# Patient Record
Sex: Female | Born: 1935
Health system: Southern US, Community
[De-identification: ages and names within clinical notes are randomized; demographics above are authoritative.]

## PROBLEM LIST (undated history)

## (undated) DIAGNOSIS — E119 Type 2 diabetes mellitus without complications: Secondary | ICD-10-CM

## (undated) DIAGNOSIS — F329 Major depressive disorder, single episode, unspecified: Secondary | ICD-10-CM

## (undated) DIAGNOSIS — F419 Anxiety disorder, unspecified: Secondary | ICD-10-CM

## (undated) DIAGNOSIS — M810 Age-related osteoporosis without current pathological fracture: Secondary | ICD-10-CM

## (undated) DIAGNOSIS — R413 Other amnesia: Secondary | ICD-10-CM

## (undated) DIAGNOSIS — Z87440 Personal history of urinary (tract) infections: Secondary | ICD-10-CM

## (undated) DIAGNOSIS — I10 Essential (primary) hypertension: Secondary | ICD-10-CM

## (undated) DIAGNOSIS — I639 Cerebral infarction, unspecified: Secondary | ICD-10-CM

## (undated) DIAGNOSIS — M1990 Unspecified osteoarthritis, unspecified site: Secondary | ICD-10-CM

## (undated) DIAGNOSIS — E559 Vitamin D deficiency, unspecified: Secondary | ICD-10-CM

## (undated) DIAGNOSIS — K219 Gastro-esophageal reflux disease without esophagitis: Secondary | ICD-10-CM

## (undated) DIAGNOSIS — Z87442 Personal history of urinary calculi: Secondary | ICD-10-CM

## (undated) DIAGNOSIS — F32A Depression, unspecified: Secondary | ICD-10-CM

## (undated) DIAGNOSIS — E785 Hyperlipidemia, unspecified: Secondary | ICD-10-CM

## (undated) DIAGNOSIS — R011 Cardiac murmur, unspecified: Secondary | ICD-10-CM

## (undated) DIAGNOSIS — Z87448 Personal history of other diseases of urinary system: Secondary | ICD-10-CM

## (undated) DIAGNOSIS — N200 Calculus of kidney: Secondary | ICD-10-CM

## (undated) HISTORY — PX: EXTRACORPOREAL SHOCK WAVE LITHOTRIPSY: SHX1557

## (undated) HISTORY — DX: Vitamin D deficiency, unspecified: E55.9

## (undated) HISTORY — PX: FRACTURE SURGERY: SHX138

## (undated) HISTORY — DX: Cerebral infarction, unspecified: I63.9

## (undated) HISTORY — PX: KNEE ARTHROSCOPY: SUR90

## (undated) HISTORY — PX: TUBAL LIGATION: SHX77

## (undated) HISTORY — PX: KNEE ARTHROPLASTY: SHX992

## (undated) HISTORY — PX: TOTAL KNEE ARTHROPLASTY: SHX125

## (undated) HISTORY — DX: Cardiac murmur, unspecified: R01.1

## (undated) HISTORY — PX: HIP ARTHROPLASTY: SHX981

## (undated) HISTORY — DX: Age-related osteoporosis without current pathological fracture: M81.0

---

## 2004-03-20 ENCOUNTER — Encounter: Admission: RE | Admit: 2004-03-20 | Discharge: 2004-06-18 | Payer: Self-pay | Admitting: Orthopedic Surgery

## 2004-08-10 ENCOUNTER — Inpatient Hospital Stay (HOSPITAL_COMMUNITY): Admission: RE | Admit: 2004-08-10 | Discharge: 2004-08-14 | Payer: Self-pay | Admitting: Orthopedic Surgery

## 2004-08-30 ENCOUNTER — Encounter: Admission: RE | Admit: 2004-08-30 | Discharge: 2004-11-28 | Payer: Self-pay | Admitting: Orthopedic Surgery

## 2004-10-24 ENCOUNTER — Ambulatory Visit (HOSPITAL_COMMUNITY): Admission: RE | Admit: 2004-10-24 | Discharge: 2004-10-24 | Payer: Self-pay | Admitting: Orthopedic Surgery

## 2006-09-17 ENCOUNTER — Ambulatory Visit: Payer: Self-pay | Admitting: Cardiovascular Disease

## 2006-10-06 ENCOUNTER — Encounter: Payer: Self-pay | Admitting: Cardiovascular Disease

## 2006-10-06 ENCOUNTER — Ambulatory Visit: Payer: Self-pay

## 2006-10-06 HISTORY — PX: TRANSTHORACIC ECHOCARDIOGRAM: SHX275

## 2007-03-09 ENCOUNTER — Inpatient Hospital Stay (HOSPITAL_COMMUNITY): Admission: RE | Admit: 2007-03-09 | Discharge: 2007-03-12 | Payer: Self-pay | Admitting: Orthopedic Surgery

## 2007-04-02 ENCOUNTER — Encounter: Admission: RE | Admit: 2007-04-02 | Discharge: 2007-04-03 | Payer: Self-pay | Admitting: Orthopedic Surgery

## 2010-11-13 NOTE — H&P (Signed)
NAME:  Caitlin Mccarthy, Caitlin Mccarthy             ACCOUNT NO.:  [id number]   MEDICAL RECORD NO.:  [id number]          PATIENT TYPE:  INP   LOCATION:  NA                           FACILITY:  [facility]   PHYSICIAN:  Ollen Gross, M.D.    DATE OF BIRTH:  [birth year]   DATE OF ADMISSION:  03/09/2007  DATE OF DISCHARGE:                              HISTORY & PHYSICAL   DATE OF OFFICE VISIT AND HISTORY AND PHYSICAL:  March 03, 2007.   CHIEF COMPLAINT:  Left knee pain.   HISTORY OF PRESENT ILLNESS:  The patient is a 75 year old female who has  been seen by Dr. Lequita Halt for ongoing left knee pain.  She previously has  undergone a right total knee back in February 2006.  That was doing  well.  The left knee unfortunately has progressed to the point where now  she is hurting most the time.  It is limiting what she can and cannot  do.  She has known end-stage arthritis and bone-on-bone throughout.  It  is felt she would benefit from undergoing surgical intervention.  Risks  and benefits discussed.  The patient is subsequently admitted to the  hospital.   ALLERGIES:  NO KNOWN DRUG ALLERGIES.   CURRENT MEDICATIONS:  1. Vytorin 10/20.  2. Amlodipine 10/20.  3. Vitamin D 5000 units.  4./  Arthrotec 75 mg.   PAST MEDICAL HISTORY:  1. Hypertension.  2. Hypercholesterolemia.  3. History of bronchitis.  4. Mild reflux.  5. History of urinary tract infections.  6. History of renal calculi.  7. Osteoporosis.  8. Postmenopausal.   PAST SURGICAL HISTORY:  1. Right knee arthroscopy, 2005.  2. Right total knee replacement, 2006.  3. Cystoscopy.  4. Lithotripsy.  5. Tubal ligation.   SOCIAL HISTORY:  Married.  Half pack smoker.  No alcohol.   FAMILY HISTORY:  Father deceased, age 39, with history of heart failure,  heart disease.  Mother deceased, age 34, with kidney failure, diabetes.  Brother with a history of renal cancer, deceased age 73.   REVIEW OF SYSTEMS:  GENERAL:  No fevers, chills, or night  sweats.  NEUROLOGIC:  No seizures, syncope, or paralysis.  RESPIRATORY:  No  shortness of breath, productive cough, or hemoptysis.  CARDIOVASCULAR:  No chest pain, angina, or orthopnea.  GI:  No nausea, vomiting,  diarrhea, or constipation.  GU: No dysuria, hematuria, or discharge.  MUSCULOSKELETAL:  Left knee.   PHYSICAL EXAMINATION:  VITAL SIGNS:  Pulse 68, respirations 12, blood  pressure 148/70.  GENERAL:  A 75 year old white female, well nourished, well developed,  large frame, slightly overweight.  No acute distress.  She is alert,  oriented, and cooperative, very pleasant.  HEENT: Normocephalic, atraumatic.  Pupils round, reactive.  Oropharynx  clear.  EOMs intact.  NECK:  Supple.  CHEST:  Clear anterior and posterior chest walls.  No rhonchi, rales, or  wheezing.  HEART:  Regular rate and rhythm.  Grade 2/6, best heard over aortic,  slight over pulmonic points.  S1, S2 noted.  ABDOMEN:  Soft, nontender.  Bowel sounds present.  RECTAL/BREASTS/GENITALIA:  Not done,  not pertinent to present illness.  EXTREMITIES:  Left knee:  Left knee shows range of motion of 10-120.  No  instability.  No effusion.  Marked crepitus.  Tender more medial than  lateral.   IMPRESSION:  1. Osteoarthritis left knee.  2. Hypertension.  3. Hypercholesterolemia.  4. History of bronchitis.  5. Mild reflux.  6. History of renal calculi.  7. Osteoporosis.  8. Postmenopausal.   PLAN:  The patient will be admitted to [facility] to undergo a  left total knee replacement arthroplasty.  Surgery will be performed by  Ollen Gross.      Alexzandrew L. Perkins, P.A.C.      Ollen Gross, M.D.  Electronically Signed    ALP/MEDQ  D:  03/08/2007  T:  03/09/2007  Job:  [id number]   cc:   Bennie Pierini, N.P.  [facility]   Ernestina Penna, M.D.  Fax: [phone]

## 2010-11-13 NOTE — Op Note (Signed)
NAME:  Caitlin, Mccarthy             ACCOUNT NO.:  [id number]   MEDICAL RECORD NO.:  [id number]          PATIENT TYPE:  INP   LOCATION:  0008                         FACILITY:  [facility]   PHYSICIAN:  Ollen Gross, M.D.    DATE OF BIRTH:  1936/03/04   DATE OF PROCEDURE:  03/09/2007  DATE OF DISCHARGE:                               OPERATIVE REPORT   PREOPERATIVE DIAGNOSIS:  Osteoarthritis, left knee.   POSTOPERATIVE DIAGNOSIS:  Osteoarthritis, left knee.   PROCEDURE:  Left total knee arthroplasty.   SURGEON:  Ollen Gross, M.D.   ASSISTANT:  Avel Peace, P.A.-C   ANESTHESIA:  Spinal.   BLOOD LOSS:  Minimal.   DRAIN:  None.   TOURNIQUET TIME:  Thirty-one minutes at 300 mmHg.   COMPLICATIONS:  None.   CONDITION:  Stable, to Recovery.   BRIEF CLINICAL NOTE:  Caitlin Mccarthy is a 75 year old female who has end-  stage arthritis of the left knee with progressively worsening pain and  dysfunction.  She has had a previous successful right total knee  arthroplasty and presents now for left total knee arthroplasty.   PROCEDURE IN DETAIL:  After the successful administration of spinal  anesthetic, a tourniquet was placed high on the left thigh and left  lower extremity prepped and draped in the usual sterile fashion.  Extremity was wrapped in an Esmarch and tourniquet inflated to 300 mmHg.  A midline incision was made with a 10 blade through subcutaneous tissue  to the level of the extensor mechanism.  A fresh blade was used make a  medial parapatellar arthrotomy.  Soft tissue over the proximal medial  tibia is subperiosteally elevated to the joint line with a knife and  into the semimembranosus bursa with a Cobb elevator.  Soft tissue  laterally is elevated with attention being paid to avoid patellar tendon  on the tibial tubercle.  With patella subluxed laterally and knee flexed  to 90 degrees, ACL and PCL removed.  Drill was used to create a starting  hole in the distal femur and  the canal was thoroughly irrigated.  A 5-  degree left valgus alignment guide is placed and referencing off the  posterior condyles, rotation is marked and a block pinned to remove 10  mm off the distal femur.  Distal femoral resection is made with an  oscillating saw.  Sizing block is placed; size 4 is the most appropriate  for the femur.  Rotation is marked at the epicondylar axis, a size 4  cutting block is placed in the anterior, posterior and chamfer cuts are  made.   Tibia is subluxed forward and the menisci are removed.  Extramedullary  tibial alignment guide is placed, referencing proximally at the medial  aspect of the tibial tubercle and distally along the 2nd metatarsal axis  and tibial crest.  A block is pinned to remove approximately 10 mm off  the non-deficient lateral side.  I had to do an additional 2 mm to get  to the base of the medial defect.  The tibial resection is made with an  oscillating saw.  Size 3 is  the most appropriate tibial component and  the proximal tibia prepared with a modular drill and keel punch for a  size 3.  Femoral preparation is completed with the intercondylar cut for  the size 4.   Size 3 mobile bearing tibial trial, size 4 posterior stabilized femoral  trial and a 12.5-mm posterior-stabilized rotating platform insert trial  are placed.  with a 12.5, there was a little bit of hyperextension, so I  went to 15, which allowed for full extension with excellent varus and  valgus balance throughout full range of motion.  The patella was everted  and thickness measured to be 24 mm.  Freehand resection was taken to 14  mm, a 38 template is placed, lug holes drilled, trial patella is placed  and it tracks normally.  Osteophytes are removed off the posterior femur  with the trial in place.  All trials are removed and the cut bone  surfaces are prepared with pulsatile lavage.  Cement was mixed and once  ready for implantation, a size 3 mobile bearing  tibial tray, size 4  posterior-stabilized femur and 38 patella are cemented in place and  patella is held with a clamp.  Trial 15 insert is placed and knee held  in full extension and all extruded cement removed.  Once cement has  fully hardened, then the permanent 15-mm posterior-stabilized rotating  platform insert is placed into the tibial tray.  Wound is copiously  irrigated with saline solution.  FloSeal is injected on the posterior  capsule, medial and lateral gutters and suprapatellar area.  A moist  sponge is placed and the tourniquet is released with a total time of 31  minutes.  Minor bleeding stopped with cautery.  Further irrigation is  performed and the arthrotomy closed with interrupted #1 PDS.  Flexion  against gravity is 135 degrees.  Subcu is then closed with interrupted 2-  0 Vicryl and a subcuticular running 4-0 Monocryl.  The incision is  cleaned and dried and Steri-Strips and a bulky sterile dressing applied.  She is then placed into a knee immobilizer, awakened and transported to  Recovery in stable condition.      Ollen Gross, M.D.  Electronically Signed     FA/MEDQ  D:  03/09/2007  T:  03/10/2007  Job:  [id number]

## 2010-11-16 NOTE — Assessment & Plan Note (Signed)
[facility]                            CARDIOLOGY OFFICE NOTE   KEITH, CANCIO                    MRN:          [id number]  DATE:09/17/2006                            DOB:          1935-10-05    REFERRING PHYSICIAN:  Ernestina Penna, M.D.   She is referred by Dr. Varney Baas office for an abnormal EKG and  dyspnea.   She is a delightful 75 year old patient without previous cardiac  history.  Coronary risk factors include hypertension and smoking.   The patient is nondiabetic.  She is also hyperlipidemic.   Family history is unremarkable for premature coronary disease.  The  patient does get some exertional dyspnea.  Her activity is limited by  chronic knee problems.  She has had the right knee operated on by Dr.  Lequita Halt, but continues to have arthritis.  She will probably need the  left knee operated on in the future.  She does not have a formal  diagnosis of COPD, but she continues to smoke, and I notice that she has  hyperinflated lungs on her chest x-ray.   She denies any significant chest pain.  Her EKG was reviewed from Dr.  Kathi Der office, and to my eye it is actually normal.  She has an  abnormal QRST wave angle in V3, but I suspect this is only lead  placement.   In talking to the patient, she has never had a heart catheterization or  echocardiogram.  She has not had a recent stress test.  There has been  no chest pain, palpitations, or syncope.   She denies any allergies.   She smokes about half a pack a day for the last 15-20 years.   She drinks 2-3 caffeinated beverages a day.  She has hyperlipidemia.  She tried to do some water aerobics in the past, but had to stop due to  her knee problems.   She tries to cook without salt.  She is retired.  She used to work on a  farm, and continues to care for three of her grandchildren.   Her husband is still alive, but they seem to do their own activities  separately.   PREVIOUS SURGERIES:  1. Kidney stones in 1981.  2. Arthroscopic right knee surgery in August 2005.  3. Total right knee replacement in February 2006.   REVIEW OF SYSTEMS:  Otherwise remarkable for anxiety and depression,  reflux, arthritis, and constipation.   Mother died at age 88 of kidney failure.  Father died at age 15 of  congestive heart failure.   CURRENT MEDICATIONS:  1. An aspirin a day.  2. Vytorin 10/20.  3. Valium p.r.n.  4. Amlodipine 10/20.  5. Arthrotec 75 a day.  6. Boniva 150 a day.  7. Hydrocodone p.r.n. for pain.   PHYSICAL EXAMINATION:  VITAL SIGNS:  Blood pressure 130/80, pulse 68 and  regular.  HEENT:  Normal.  NECK:  Carotids normal, without bruit.  LUNGS:  Clear.  HEART:  There is an S1, S2, with a soft systolic murmur.  ABDOMEN:  Benign.  LOWER EXTREMITIES:  Intact  pulses, no edema.  She has decreased range of  motion in the right leg and walks with a cane.   EKG was as described, and to my review it is essentially normal.   IMPRESSION:  A 75 year old smoker with hypertension, hyperlipidemia, and  need for continued knee surgery.  Given the concerns about her EKG and  her risk factors, I think it is reasonable for the patient to have an  adenosine Myoview study.  This will help risk stratify her surgical risk  in the future as well.  We will do a 2D echocardiogram to rule out  anteroseptal or anterior wall motion abnormalities, and also assess her  soft systolic murmur.  I suspect that both of these tests will be low  risk, and she will be fine for her upcoming left knee surgery.   She will continue her amlodipine for blood pressure control.  She will  continue her Vytorin for hypercholesterolemia.  Dr. Christell Constant will continue  to follow her lipid and liver profiles.   So long as her Myoview and echocardiogram are normal, she will follow up  at [facility].  If either one of them are  abnormal, she will see me right  away.     Noralyn Pick. Eden Emms, MD, [facility]  Electronically Signed    PCN/MedQ  DD: 09/17/2006  DT: 09/17/2006  Job #: [id number]

## 2010-11-16 NOTE — Discharge Summary (Signed)
[name], WONDER             ACCOUNT NO.:  [id number]   MEDICAL RECORD NO.:  [id number]          PATIENT TYPE:  INP   LOCATION:  1605                         FACILITY:  [facility]   PHYSICIAN:  Caitlin Mccarthy, M.D.    DATE OF BIRTH:  August 02, 1935   DATE OF ADMISSION:  03/09/2007  DATE OF DISCHARGE:  03/12/2007                               DISCHARGE SUMMARY   ADMITTING DIAGNOSES:  1. Osteoarthritis, left knee.  2. Hypertension.  3. Hypercholesterolemia.  4. History of bronchitis.  5. Mild reflux.  6. History of renal calculi.  7. Osteoporosis.  8. Post menopausal.   DISCHARGE DIAGNOSES:  1. Osteoarthritis, left knee.      a.     Status post left total knee replacement arthroplasty.  2. Mild postoperative hyponatremia, improved.  3. Hypertension.  4. Hypercholesterolemia.  5. History of bronchitis.  6. Mild reflux.  7. History of renal calculi.  8. Osteoporosis.  9. Post menopausal.   PROCEDURE:  March 09, 2007, left total knee.  Surgeon:  Dr. Lequita Halt.  Assistant:  Avel Peace, PA-C.  Spinal anesthesia.  Minimal blood loss.  Tourniquet time:  Thirty-one minutes.   CONSULTATIONS:  None.   BRIEF HISTORY:  [name] is a 75 year old female with end stage  arthritis of the left knee, progressive worsening pain dysfunction,  successful right total knee, now presents for a left total knee.   LABORATORY DATA:  Pre-op CBC showed a hemoglobin of 14.5, hematocrit of  41.6, white cell count 9.5.  Post-op hemoglobin 12.2, drifted down to  11.7, last known H&H 11.7 and 33.5.  PT PTT pre-op 12 and 31,  respectively.  INR 0.9.  Serial pro time values followed with last known  PT INR 18..2 and 1.5.  Chem panel all within normal limits.  Serial B-  METs were followed.  Sodium did drop from 140 to 133 back up to 135.  Pre-op UA:  Positive glucose, trace leukocyte esterase, many epithelial  cells, only 3-6 white cells.  Followup UA negative.  Blood group/type is  O positive.   EKG,  September 11, 2006, sinus rhythm, changes consistent with anterior  septal infarct age indeterminate is confirmed.   HOSPITAL COURSE:  The patient was admitted to [facility],  tolerated procedure well, later transferred to the recovery room on the  orthopedic floor, started on started on PCA and p.o. analgesics for pain  control following surgery and was doing pretty well on the morning of  day #1, had a fair amount of pain postoperatively but a little bit  better, some intermittent nausea, started back on her home meds.  She  started to get up out of bed on day #1.  Weaned over to p.o. meds.  By day #2 she was doing much better.  Weaned off her PCA.  Started  getting up with PT.  From a therapy standpoint, she was getting about 12  feet that morning and about 75 feet that afternoon.  We did a followup  UA because the pre-op one was borderline.  The followup UA was negative.  The dressing was changed  on day #2.  The incision looked good.  Hemoglobin was down a little bit but she was asymptomatic.  Sodium  dropped a little bit, felt to be too dilutional component.  Fluids were  discontinued.  Her sodium came back up.  Continued to progress well.  By day #3, she was up ambulating 75 feet, and weaned over to p.o. meds.  She was progressing well with therapy and was discharged home.   DISCHARGE PLAN:  1. Discharge plan:  The patient was discharged home on March 12, 2007.  2. Discharge diagnoses:  Please see above.  3. Discharge meds:  Coumadin, Percocet, Robaxin, Lovenox prior to      discharge.  4. Follow up 2 weeks.  5. Activity:  Weightbearing as tolerated.  Home health PT.  Home      health nursing.  6. Diet:  As tolerated.   DISPOSITION:  Home   CONDITION ON DISCHARGE:  Improving.      Caitlin Mccarthy, P.A.C.      Caitlin Mccarthy, M.D.  Electronically Signed    ALP/MEDQ  D:  04/16/2007  T:  04/16/2007  Job:  [id number]   cc:   Ernestina Penna, M.D.   Fax: [phone]   Bennie Pierini, NP  [facility]   Caitlin Mccarthy, M.D.  Fax: [phone]

## 2011-04-12 LAB — URINALYSIS, ROUTINE W REFLEX MICROSCOPIC
Bilirubin Urine: NEGATIVE
Bilirubin Urine: NEGATIVE
Hgb urine dipstick: NEGATIVE
Nitrite: NEGATIVE
Protein, ur: NEGATIVE
Specific Gravity, Urine: 1.01
Specific Gravity, Urine: 1.021
Urobilinogen, UA: 0.2
Urobilinogen, UA: 0.2
pH: 6.5

## 2011-04-12 LAB — COMPREHENSIVE METABOLIC PANEL
ALT: 24
AST: 20
Albumin: 4.3
Alkaline Phosphatase: 57
Chloride: 107
GFR calc Af Amer: >60
Potassium: 3.9
Sodium: 140
Total Bilirubin: 0.7
Total Protein: 7.5

## 2011-04-12 LAB — CBC
HCT: 41.6
Hemoglobin: 11.7 — ABNORMAL LOW
MCHC: 34.8
Platelets: 163
Platelets: 173
Platelets: 235
RBC: 3.73 — ABNORMAL LOW
RDW: 12.6
RDW: 13.1
RDW: 13.3
WBC: 9.1
WBC: 9.5

## 2011-04-12 LAB — BASIC METABOLIC PANEL
BUN: 4 — ABNORMAL LOW
BUN: 8
Calcium: 9.3
Calcium: 9.5
Calcium: 9.8
Creatinine, Ser: 0.41
Creatinine, Ser: 0.5
Creatinine, Ser: 0.52
GFR calc Af Amer: >60
GFR calc Af Amer: >60
GFR calc non Af Amer: >60
GFR calc non Af Amer: >60
Glucose, Bld: 127 — ABNORMAL HIGH
Glucose, Bld: 134 — ABNORMAL HIGH
Sodium: 134 — ABNORMAL LOW
Sodium: 135

## 2011-04-12 LAB — PROTIME-INR
INR: 1
INR: 1.3
INR: 1.5
INR: 0.9
Prothrombin Time: 13.7
Prothrombin Time: 16.5 — ABNORMAL HIGH
Prothrombin Time: 18.2 — ABNORMAL HIGH

## 2011-04-12 LAB — TYPE AND SCREEN
ABO/RH(D): O POS
Antibody Screen: NEGATIVE

## 2011-04-12 LAB — URINE MICROSCOPIC-ADD ON

## 2011-05-28 ENCOUNTER — Other Ambulatory Visit: Payer: Self-pay

## 2011-05-28 ENCOUNTER — Encounter (HOSPITAL_COMMUNITY)
Admission: RE | Admit: 2011-05-28 | Discharge: 2011-05-28 | Disposition: A | Discharge status: Home / Self Care | Payer: Medicare HMO | Source: Ambulatory Visit | Attending: Ophthalmology | Admitting: Ophthalmology

## 2011-05-28 ENCOUNTER — Encounter (HOSPITAL_COMMUNITY): Payer: Self-pay | Admitting: Pharmacy Technician

## 2011-05-28 ENCOUNTER — Encounter (HOSPITAL_COMMUNITY): Payer: Self-pay

## 2011-05-28 HISTORY — DX: Essential (primary) hypertension: I10

## 2011-05-28 HISTORY — DX: Anxiety disorder, unspecified: F41.9

## 2011-05-28 HISTORY — DX: Major depressive disorder, single episode, unspecified: F32.9

## 2011-05-28 HISTORY — DX: Depression, unspecified: F32.A

## 2011-05-28 LAB — CBC
HCT: 44.6 % (ref 36.0–46.0)
Hemoglobin: 14.7 g/dL (ref 12.0–15.0)
RDW: 13.2 % (ref 11.5–15.5)
WBC: 5.5 10*3/uL (ref 4.0–10.5)

## 2011-05-28 LAB — BASIC METABOLIC PANEL
BUN: 18 mg/dL (ref 6–23)
Chloride: 103 mEq/L (ref 96–112)
GFR calc Af Amer: >90 mL/min (ref >90)
Potassium: 4.4 mEq/L (ref 3.5–5.1)
Sodium: 139 mEq/L (ref 135–145)

## 2011-05-28 NOTE — Patient Instructions (Addendum)
22 Caitlin Mccarthy  05/28/2011   Your procedure is scheduled on:  06/06/2011  Report to [facility] at 08:00 AM.  Call this number if you have problems the morning of surgery: [phone]   Remember:   Do not eat food:After Midnight.  May have clear liquids:until Midnight .  Clear liquids include soda, tea, black coffee, apple or grape juice, broth.  Take these medicines the morning of surgery with A SIP OF WATER: Lotrel, Celebrex   Do not wear jewelry, make-up or nail polish.  Do not wear lotions, powders, or perfumes. You may wear deodorant.  Do not shave 48 hours prior to surgery.  Do not bring valuables to the hospital.  Contacts, dentures or bridgework may not be worn into surgery.  Leave suitcase in the car. After surgery it may be brought to your room.  For patients admitted to the hospital, checkout time is 11:00 AM the day of discharge.   Patients discharged the day of surgery will not be allowed to drive home.  Name and phone number of your driver:   Special Instructions: N/A   Please read over the following fact sheets that you were given: Anesthesia Post-op Instructions      Cataract A cataract is a clouding of the lens of the eye. It is most often related to aging. A cataract is not a "film" over the surface of the eye. The lens is inside the eye and changes size of the pupil. The lens can enlarge to let more light enter the eye in dark environments and contract the size of the pupil to let in bright light. The lens is the part of the eye that helps focus light on the retina. The retina is the eye's light-sensitive layer. It is in the back of the eye that sends visual signals to the brain. In a normal eye, light passes through the lens and gets focused on the retina. To help produce a sharp image, the lens must remain clear. When a lens becomes cloudy, vision is compromised by the degree and nature of the clouding. Certain cataracts make people more near-sighted as they  develop, others increase glare, and all reduce vision to some degree or another. A cataract that is so dense that it becomes milky white and a white opacity can be seen through the pupil. When the white color is seen, it is called a "mature" or "hyper-mature cataract." Such cataracts cause total blindness in the affected eye. The cataract must be removed to prevent damage to the eye itself. Some types of cataracts can cause a secondary disease of the eye, such as certain types of glaucoma. In the early stages, better lighting and eyeglasses may lessen vision problems caused by cataracts. At a certain point, surgery may be needed to improve vision. CAUSES   Aging. However, cataracts may occur at any age, even in newborns.   Certain drugs.   Trauma to the eye.   Certain diseases (such as diabetes).   Inherited or acquired medical syndromes.  SYMPTOMS   Gradual, progressive drop in vision in the affected eye. Cataracts may develop at different rates in each eye. Cataracts may even be in just one eye with the other unaffected.   Cataracts due to trauma may develop quickly, sometimes over a matter or days or even hours. The result is severe and rapid visual loss.  DIAGNOSIS  To detect a cataract, an eye doctor examines the lens. A well developed cataract can be diagnosed without dilating  the pupil. Early cataracts and others of a specific nature are best diagnosed with an exam of the eyes with the pupils dilated by drops. TREATMENT   For an early cataract, vision may improve by using different eyeglasses or stronger lighting.   If the above measures do not help, surgery is the only effective treatment. This treatment removes the cloudy lens and replaces it with a substitute lens (Intraocular lens, or IOL). Newly developed IOL technology allows the implanted lens to improve vision both at a distance and up close. Discuss with your eye surgeon about the possibility of still needing glasses. Also  discuss how visual coordination between both eyes will be affected.  A cataract needs to be removed only when vision loss interferes with your everyday activities such as driving, reading or watching TV. You and your eye doctor can make that decision together. In most cases, waiting until you are ready to have cataract surgery will not harm your eye. If you have cataracts in both eyes, only one should be removed at a time. This allows the operated eye to heal and be out of danger from serious problems (such as infection or poor wound healing) before having the other eye undergo surgery.  Sometimes, a cataract should be removed even if it does not cause problems with your vision. For example, a cataract should be removed if it prevents examination or treatment of another eye problem. Just as you cannot see out of the affected eye well, your doctor cannot see into your eye well through a cataract. The vast majority of people who have cataract surgery have better vision afterward. CATARACT REMOVAL There are two primary ways to remove a cataract. Your doctor can explain the differences and help determine which is best for you:  Phacoemulsification (small incision cataract surgery). This involves making a small cut (incision) on the edge of the clear, dome-shaped surface that covers the front of the eye (the cornea). An injection behind the eye or eye drops are given to make this a painless procedure. The doctor then inserts a tiny probe into the eye. This device emits ultrasound waves that soften and break up the cloudy center of the lens so it can be removed by suction. Most cataract surgery is done this way. The cuts are usually so small and performed in such a manner that often no sutures are needed to keep it closed.   Extracapsular surgery. Your doctor makes a slightly longer incision on the side of the cornea. The doctor removes the hard center of the lens. The remainder of the lens is then removed by  suction. In some cases, extremely fine sutures are needed which the doctor may, or may not remove in the office after the surgery.  When an IOL is implanted, it needs no care. It becomes a permanent part of your eye and cannot be seen or felt.  Some people cannot have an IOL. They may have problems during surgery, or maybe they have another eye disease. For these people, a soft contact lens may be suggested. If an IOL or contact lens cannot be used, very powerful and thick glasses are required after surgery. Since vision is very different through such thick glasses, it is important to have your doctor discuss the impact on your vision after any cataract surgery where there is no plan to implant an IOL. The normal lens of the eye is covered by a clear capsule. Both phacoemulsification and extracapsular surgery require that the back surface  of this lens capsule be left in place. This helps support IOLs and prevents the IOL from dislocating and falling back into the deeper interior of the eye. Right after surgery, and often permanently this "posterior capsule" remains clear. In some cases however, it can become cloudy, presenting the same type of visual compromise that the original cataract did since light is again obstructed as it passes through the clear IOL. This condition is often referred to as an "after-cataract." Fortunately, after-cataracts are easily treated using a painless and very fast laser treatment that is performed without anesthesia or incisions. It is done in a matter of minutes in an outpatient environment. Visual improvement is often immediate.  HOME CARE INSTRUCTIONS   Your surgeon will discuss pre and post operative care with you prior to surgery. The majority of people are able to do almost all normal activities right away. Although, it is often advised to avoid strenuous activity for a period of time.   Postoperative drops and careful avoidance of infection will be needed. Many surgeons  suggest the use of a protective shield during the first few days after surgery.   There is a very small incidence of complication from modern cataract surgery, but it can happen. Infection that spreads to the inside of the eye (endophthalmitis) can result in total visual loss and even loss of the eye itself. In extremely rare instances, the inflammation of endophthalmitis can spread to both eyes (sympathetic ophthalmia). Appropriate post-operative care under the close observation of your surgeon is essential to a successful outcome.  SEEK IMMEDIATE MEDICAL CARE IF:   You have any sudden drop of vision in the operated eye.   You have pain in the operated eye.   You see a large number of floating dots in the field of vision in the operated eye.   You see flashing lights, or if a portion of your side vision in any direction appears black (like a curtain being drawn into your field of vision) in the operated eye.  Document Released: 06/17/2005 Document Revised: 02/27/2011 Document Reviewed: 08/03/2007 [facility] Patient Information 2012 [place], [place].      PATIENT INSTRUCTIONS POST-ANESTHESIA  IMMEDIATELY FOLLOWING SURGERY:  Do not drive or operate machinery for the first twenty four hours after surgery.  Do not make any important decisions for twenty four hours after surgery or while taking narcotic pain medications or sedatives.  If you develop intractable nausea and vomiting or a severe headache please notify your doctor immediately.  FOLLOW-UP:  Please make an appointment with your surgeon as instructed. You do not need to follow up with anesthesia unless specifically instructed to do so.  WOUND CARE INSTRUCTIONS (if applicable):  Keep a dry clean dressing on the anesthesia/puncture wound site if there is drainage.  Once the wound has quit draining you may leave it open to air.  Generally you should leave the bandage intact for twenty four hours unless there is drainage.  If the epidural  site drains for more than 36-48 hours please call the anesthesia department.  QUESTIONS?:  Please feel free to call your physician or the hospital operator if you have any questions, and they will be happy to assist you.     [facility] Anesthesia Department [address] [place] [phone]

## 2011-06-06 ENCOUNTER — Encounter (HOSPITAL_COMMUNITY): Payer: Self-pay | Admitting: Anesthesiology

## 2011-06-06 ENCOUNTER — Encounter (HOSPITAL_COMMUNITY)
Admission: RE | Disposition: A | Discharge status: Home / Self Care | Payer: Self-pay | Source: Ambulatory Visit | Attending: Ophthalmology

## 2011-06-06 ENCOUNTER — Ambulatory Visit (HOSPITAL_COMMUNITY): Payer: Medicare HMO | Admitting: Anesthesiology

## 2011-06-06 ENCOUNTER — Ambulatory Visit (HOSPITAL_COMMUNITY)
Admission: RE | Admit: 2011-06-06 | Discharge: 2011-06-06 | Disposition: A | Discharge status: Home / Self Care | Payer: Medicare HMO | Source: Ambulatory Visit | Attending: Ophthalmology | Admitting: Ophthalmology

## 2011-06-06 ENCOUNTER — Encounter (HOSPITAL_COMMUNITY): Payer: Self-pay | Admitting: Ophthalmology

## 2011-06-06 DIAGNOSIS — I10 Essential (primary) hypertension: Secondary | ICD-10-CM | POA: Insufficient documentation

## 2011-06-06 DIAGNOSIS — Z79899 Other long term (current) drug therapy: Secondary | ICD-10-CM | POA: Insufficient documentation

## 2011-06-06 DIAGNOSIS — H2589 Other age-related cataract: Secondary | ICD-10-CM | POA: Insufficient documentation

## 2011-06-06 DIAGNOSIS — Z01810 Encounter for preprocedural cardiovascular examination: Secondary | ICD-10-CM | POA: Insufficient documentation

## 2011-06-06 DIAGNOSIS — Z01812 Encounter for preprocedural laboratory examination: Secondary | ICD-10-CM | POA: Insufficient documentation

## 2011-06-06 HISTORY — PX: CATARACT EXTRACTION W/PHACO: SHX586

## 2011-06-06 SURGERY — PHACOEMULSIFICATION, CATARACT, WITH IOL INSERTION
Anesthesia: Monitor Anesthesia Care | Site: Eye | Laterality: Right | Wound class: Clean

## 2011-06-06 MED ORDER — BSS IO SOLN
INTRAOCULAR | Status: DC | PRN
  Administered 2011-06-06: 15 mL via INTRAOCULAR

## 2011-06-06 MED ORDER — LIDOCAINE 3.5 % OP GEL OPTIME - NO CHARGE
OPHTHALMIC | Status: DC | PRN
  Administered 2011-06-06: 1 [drp] via OPHTHALMIC

## 2011-06-06 MED ORDER — MIDAZOLAM HCL 2 MG/2ML IJ SOLN
1.0000 mg | INTRAMUSCULAR | Status: DC | PRN
  Administered 2011-06-06: 2 mg via INTRAVENOUS

## 2011-06-06 MED ORDER — TETRACAINE HCL 0.5 % OP SOLN
1.0000 [drp] | OPHTHALMIC | Status: AC
  Administered 2011-06-06 (×3): 1 [drp] via OPHTHALMIC

## 2011-06-06 MED ORDER — LIDOCAINE HCL (PF) 1 % IJ SOLN
INTRAMUSCULAR | Status: AC
  Filled 2011-06-06: qty 2

## 2011-06-06 MED ORDER — CYCLOPENTOLATE-PHENYLEPHRINE 0.2-1 % OP SOLN
1.0000 [drp] | OPHTHALMIC | Status: AC
  Administered 2011-06-06 (×3): 1 [drp] via OPHTHALMIC

## 2011-06-06 MED ORDER — MIDAZOLAM HCL 2 MG/2ML IJ SOLN
INTRAMUSCULAR | Status: AC
  Filled 2011-06-06: qty 2

## 2011-06-06 MED ORDER — LACTATED RINGERS IV SOLN
INTRAVENOUS | Status: DC
  Administered 2011-06-06: 1000 mL via INTRAVENOUS

## 2011-06-06 MED ORDER — PHENYLEPHRINE HCL 2.5 % OP SOLN
OPHTHALMIC | Status: AC
  Administered 2011-06-06: 1 [drp] via OPHTHALMIC
  Filled 2011-06-06: qty 2

## 2011-06-06 MED ORDER — NEOMYCIN-POLYMYXIN-DEXAMETH 0.1 % OP OINT
TOPICAL_OINTMENT | OPHTHALMIC | Status: DC | PRN
  Administered 2011-06-06: 1 via OPHTHALMIC

## 2011-06-06 MED ORDER — PHENYLEPHRINE HCL 2.5 % OP SOLN
1.0000 [drp] | OPHTHALMIC | Status: AC
  Administered 2011-06-06 (×3): 1 [drp] via OPHTHALMIC

## 2011-06-06 MED ORDER — CYCLOPENTOLATE-PHENYLEPHRINE 0.2-1 % OP SOLN
OPHTHALMIC | Status: AC
  Administered 2011-06-06: 1 [drp] via OPHTHALMIC
  Filled 2011-06-06: qty 2

## 2011-06-06 MED ORDER — LIDOCAINE HCL 3.5 % OP GEL
1.0000 "application " | Freq: Once | OPHTHALMIC | Status: AC
  Administered 2011-06-06: 1 via OPHTHALMIC

## 2011-06-06 MED ORDER — LIDOCAINE HCL (PF) 1 % IJ SOLN
INTRAMUSCULAR | Status: DC | PRN
  Administered 2011-06-06: .4 mL

## 2011-06-06 MED ORDER — LIDOCAINE HCL 3.5 % OP GEL
OPHTHALMIC | Status: AC
  Administered 2011-06-06: 1 via OPHTHALMIC
  Filled 2011-06-06: qty 5

## 2011-06-06 MED ORDER — TETRACAINE HCL 0.5 % OP SOLN
OPHTHALMIC | Status: AC
  Administered 2011-06-06: 1 [drp] via OPHTHALMIC
  Filled 2011-06-06: qty 2

## 2011-06-06 MED ORDER — PROVISC 10 MG/ML IO SOLN
INTRAOCULAR | Status: DC | PRN
  Administered 2011-06-06: 8.5 mg via INTRAOCULAR

## 2011-06-06 MED ORDER — POVIDONE-IODINE 5 % OP SOLN
OPHTHALMIC | Status: DC | PRN
  Administered 2011-06-06: 1 via OPHTHALMIC

## 2011-06-06 MED ORDER — EPINEPHRINE HCL 1 MG/ML IJ SOLN
INTRAOCULAR | Status: DC | PRN
  Administered 2011-06-06: 09:00:00

## 2011-06-06 MED ORDER — NEOMYCIN-POLYMYXIN-DEXAMETH 3.5-10000-0.1 OP OINT
TOPICAL_OINTMENT | OPHTHALMIC | Status: AC
  Filled 2011-06-06: qty 3.5

## 2011-06-06 MED ORDER — EPINEPHRINE HCL 1 MG/ML IJ SOLN
INTRAMUSCULAR | Status: AC
  Filled 2011-06-06: qty 1

## 2011-06-06 SURGICAL SUPPLY — 32 items
CAPSULAR TENSION RING-AMO (OPHTHALMIC RELATED) IMPLANT
CLOTH BEACON ORANGE TIMEOUT ST (SAFETY) ×1 IMPLANT
EYE SHIELD UNIVERSAL CLEAR (GAUZE/BANDAGES/DRESSINGS) ×2 IMPLANT
GLOVE BIO SURGEON STRL SZ 6.5 (GLOVE) IMPLANT
GLOVE BIOGEL PI IND STRL 6.5 (GLOVE) ×1 IMPLANT
GLOVE BIOGEL PI IND STRL 7.0 (GLOVE) IMPLANT
GLOVE BIOGEL PI IND STRL 7.5 (GLOVE) IMPLANT
GLOVE BIOGEL PI INDICATOR 6.5 (GLOVE) ×1
GLOVE BIOGEL PI INDICATOR 7.0 (GLOVE)
GLOVE BIOGEL PI INDICATOR 7.5 (GLOVE)
GLOVE ECLIPSE 6.5 STRL STRAW (GLOVE) IMPLANT
GLOVE ECLIPSE 7.0 STRL STRAW (GLOVE) IMPLANT
GLOVE ECLIPSE 7.5 STRL STRAW (GLOVE) IMPLANT
GLOVE EXAM NITRILE LRG STRL (GLOVE) IMPLANT
GLOVE EXAM NITRILE MD LF STRL (GLOVE) ×1 IMPLANT
GLOVE SKINSENSE NS SZ6.5 (GLOVE)
GLOVE SKINSENSE NS SZ7.0 (GLOVE)
GLOVE SKINSENSE STRL SZ6.5 (GLOVE) IMPLANT
GLOVE SKINSENSE STRL SZ7.0 (GLOVE) IMPLANT
KIT VITRECTOMY (OPHTHALMIC RELATED) IMPLANT
PAD ARMBOARD 7.5X6 YLW CONV (MISCELLANEOUS) ×1 IMPLANT
PROC W NO LENS (INTRAOCULAR LENS)
PROC W SPEC LENS (INTRAOCULAR LENS)
PROCESS W NO LENS (INTRAOCULAR LENS) IMPLANT
PROCESS W SPEC LENS (INTRAOCULAR LENS) IMPLANT
RING MALYGIN (MISCELLANEOUS) IMPLANT
SIGHTPATH CAT PROC W REG LENS (Ophthalmic Related) ×2 IMPLANT
SYR TB 1ML LL NO SAFETY (SYRINGE) ×2 IMPLANT
TAPE SURG TRANSPORE 1 IN (GAUZE/BANDAGES/DRESSINGS) IMPLANT
TAPE SURGICAL TRANSPORE 1 IN (GAUZE/BANDAGES/DRESSINGS) ×1
VISCOELASTIC ADDITIONAL (OPHTHALMIC RELATED) IMPLANT
WATER STERILE IRR 250ML POUR (IV SOLUTION) ×2 IMPLANT

## 2011-06-06 NOTE — Anesthesia Postprocedure Evaluation (Signed)
  Anesthesia Post-op Note  Patient: Caitlin Mccarthy  Procedure(s) Performed:  CATARACT EXTRACTION PHACO AND INTRAOCULAR LENS PLACEMENT (IOC) - CDE=12.77  Patient Location: PACU  Anesthesia Type: MAC  Level of Consciousness: awake, alert  and oriented  Airway and Oxygen Therapy: Patient Spontanous Breathing  Post-op Pain: none  Post-op Assessment: Post-op Vital signs reviewed, Patient's Cardiovascular Status Stable, Respiratory Function Stable and Patent Airway  Post-op Vital Signs: Reviewed and stable  Complications: No apparent anesthesia complications

## 2011-06-06 NOTE — Brief Op Note (Signed)
Pre-Op Dx: Cataract OD Post-Op Dx: Cataract OD Surgeon: Kamya Watling Anesthesia: Topical with MAC Implant: Lenstec, Model Softec HD Blood Loss: None Specimen: None Complications: None 

## 2011-06-06 NOTE — Transfer of Care (Signed)
Immediate Anesthesia Transfer of Care Note  Patient: Caitlin Mccarthy  Procedure(s) Performed:  CATARACT EXTRACTION PHACO AND INTRAOCULAR LENS PLACEMENT (IOC) - CDE=12.77  Patient Location: Short Stay  Anesthesia Type: MAC  Level of Consciousness: awake, alert  and oriented  Airway & Oxygen Therapy: Patient Spontanous Breathing  Post-op Assessment: Report given to PACU RN  Post vital signs: Reviewed and stable  Complications: No apparent anesthesia complications

## 2011-06-06 NOTE — H&P (Signed)
I have reviewed the H&P, the patient was re-examined, and I have identified no interval changes in medical condition and plan of care since the history and physical of record  

## 2011-06-06 NOTE — Op Note (Signed)
NAME:  Caitlin Mccarthy, Caitlin Mccarthy             ACCOUNT NO.:  [id number]  MEDICAL RECORD NO.:  [id number]  LOCATION:  APPO                          FACILITY:  [facility]  PHYSICIAN:  Susanne Greenhouse, MD       DATE OF BIRTH:  July 24, 1935  DATE OF PROCEDURE:  06/06/2011 DATE OF DISCHARGE:  06/06/2011                              OPERATIVE REPORT   PREOPERATIVE DIAGNOSIS:  Combined cataract, right eye, diagnosis code 366.19.  POSTOPERATIVE DIAGNOSIS:  Combined cataract, right eye, diagnosis code 366.19.  OPERATION PERFORMED:  Phacoemulsification with posterior chamber intraocular lens implantation, right eye.  SURGEON:  Bonne Dolores. Cristella Stiver, MD  ANESTHESIA:  General endotracheal anesthesia.  OPERATIVE SUMMARY:  In the preoperative area, dilating drops were placed into the right eye.  The patient was then brought into the operating room where she was placed under general anesthesia.  The eye was then prepped and draped.  Beginning with a 75 blade, a paracentesis port was made at the surgeon's 2 o'clock position.  The anterior chamber was then filled with a 1% nonpreserved lidocaine solution with epinephrine.  This was followed by Viscoat to deepen the chamber.  A small fornix-based peritomy was performed superiorly.  Next, a single iris hook was placed through the limbus superiorly.  A 2.4-mm keratome blade was then used to make a clear corneal incision over the iris hook.  A bent cystotome needle and Utrata forceps were used to create a continuous tear capsulotomy.  Hydrodissection was performed using balanced salt solution on a fine cannula.  The lens nucleus was then removed using phacoemulsification in a quadrant cracking technique.  The cortical material was then removed with irrigation and aspiration.  The capsular bag and anterior chamber were refilled with Provisc.  The wound was widened to approximately 3 mm and a posterior chamber intraocular lens was placed into the capsular bag without difficulty  using an [employer] lens injecting system.  A single 10-0 nylon suture was then used to close the incision as well as stromal hydration.  The Provisc was removed from the anterior chamber and capsular bag with irrigation and aspiration.  At this point, the wounds were tested for leak, which were negative.  The anterior chamber remained deep and stable.  The patient tolerated the procedure well.  There were no operative complications, and she awoke from general anesthesia without problem.  No surgical specimens.  Prosthetic device used is a Lenstec posterior chamber lens, model Softec HD, power of 21.0, serial number is [id number].          ______________________________ Susanne Greenhouse, MD     KEH/MEDQ  D:  06/06/2011  T:  06/06/2011  Job:  [id number]

## 2011-06-06 NOTE — Anesthesia Preprocedure Evaluation (Signed)
Anesthesia Evaluation  Patient identified by MRN, date of birth, ID band Patient awake    Reviewed: Allergy & Precautions, H&P , NPO status , Patient's Chart, lab work & pertinent test results  Airway Mallampati: II      Dental  (+) Teeth Intact   Pulmonary shortness of breath and with exertion, Current Smoker,    Pulmonary exam normal       Cardiovascular hypertension, Pt. on medications Regular Normal    Neuro/Psych    GI/Hepatic   Endo/Other    Renal/GU      Musculoskeletal  (+) Arthritis -,   Abdominal   Peds  Hematology   Anesthesia Other Findings   Reproductive/Obstetrics                           Anesthesia Physical Anesthesia Plan  ASA: II  Anesthesia Plan: MAC   Post-op Pain Management:    Induction:   Airway Management Planned: Nasal Cannula  Additional Equipment:   Intra-op Plan:   Post-operative Plan:   Informed Consent: I have reviewed the patients History and Physical, chart, labs and discussed the procedure including the risks, benefits and alternatives for the proposed anesthesia with the patient or authorized representative who has indicated his/her understanding and acceptance.     Plan Discussed with:   Anesthesia Plan Comments:         Anesthesia Quick Evaluation

## 2011-06-13 ENCOUNTER — Encounter (HOSPITAL_COMMUNITY): Payer: Self-pay | Admitting: Ophthalmology

## 2011-06-20 ENCOUNTER — Encounter (HOSPITAL_COMMUNITY): Payer: Self-pay

## 2011-06-20 ENCOUNTER — Encounter (HOSPITAL_COMMUNITY)
Admit: 2011-06-20 | Discharge: 2011-06-20 | Disposition: A | Discharge status: Home / Self Care | Payer: Medicare HMO | Attending: Ophthalmology | Admitting: Ophthalmology

## 2011-06-20 MED ORDER — ONDANSETRON HCL 4 MG/2ML IJ SOLN: 4.0000 mg | Freq: Once | INTRAMUSCULAR | Status: DC | PRN

## 2011-06-20 MED ORDER — FENTANYL CITRATE 0.05 MG/ML IJ SOLN: 25.0000 ug | INTRAMUSCULAR | Status: DC | PRN

## 2011-06-27 ENCOUNTER — Encounter (HOSPITAL_COMMUNITY)
Admission: RE | Disposition: A | Discharge status: Home / Self Care | Payer: Self-pay | Source: Ambulatory Visit | Attending: Ophthalmology

## 2011-06-27 ENCOUNTER — Ambulatory Visit (HOSPITAL_COMMUNITY)
Admission: RE | Admit: 2011-06-27 | Discharge: 2011-06-27 | Disposition: A | Discharge status: Home / Self Care | Payer: Medicare HMO | Source: Ambulatory Visit | Attending: Ophthalmology | Admitting: Ophthalmology

## 2011-06-27 ENCOUNTER — Encounter (HOSPITAL_COMMUNITY): Payer: Self-pay | Admitting: Anesthesiology

## 2011-06-27 ENCOUNTER — Encounter (HOSPITAL_COMMUNITY): Payer: Self-pay | Admitting: *Deleted

## 2011-06-27 ENCOUNTER — Ambulatory Visit (HOSPITAL_COMMUNITY): Payer: Medicare HMO | Admitting: Anesthesiology

## 2011-06-27 DIAGNOSIS — H2589 Other age-related cataract: Secondary | ICD-10-CM | POA: Insufficient documentation

## 2011-06-27 DIAGNOSIS — Z79899 Other long term (current) drug therapy: Secondary | ICD-10-CM | POA: Insufficient documentation

## 2011-06-27 DIAGNOSIS — I10 Essential (primary) hypertension: Secondary | ICD-10-CM | POA: Insufficient documentation

## 2011-06-27 HISTORY — PX: CATARACT EXTRACTION W/PHACO: SHX586

## 2011-06-27 SURGERY — PHACOEMULSIFICATION, CATARACT, WITH IOL INSERTION
Anesthesia: Monitor Anesthesia Care | Site: Eye | Laterality: Left | Wound class: Clean

## 2011-06-27 MED ORDER — NEOMYCIN-POLYMYXIN-DEXAMETH 0.1 % OP OINT
TOPICAL_OINTMENT | OPHTHALMIC | Status: DC | PRN
  Administered 2011-06-27: 1 via OPHTHALMIC

## 2011-06-27 MED ORDER — MIDAZOLAM HCL 2 MG/2ML IJ SOLN
INTRAMUSCULAR | Status: AC
  Administered 2011-06-27: 2 mg via INTRAVENOUS
  Filled 2011-06-27: qty 2

## 2011-06-27 MED ORDER — TETRACAINE HCL 0.5 % OP SOLN
OPHTHALMIC | Status: AC
  Administered 2011-06-27: 1 [drp] via OPHTHALMIC
  Filled 2011-06-27: qty 2

## 2011-06-27 MED ORDER — EPINEPHRINE HCL 1 MG/ML IJ SOLN
INTRAOCULAR | Status: DC | PRN
  Administered 2011-06-27: 13:00:00

## 2011-06-27 MED ORDER — ONDANSETRON HCL 4 MG/2ML IJ SOLN: 4.0000 mg | Freq: Once | INTRAMUSCULAR | Status: DC | PRN

## 2011-06-27 MED ORDER — LIDOCAINE HCL 3.5 % OP GEL
OPHTHALMIC | Status: AC
  Administered 2011-06-27: 1 via OPHTHALMIC
  Filled 2011-06-27: qty 5

## 2011-06-27 MED ORDER — CYCLOPENTOLATE-PHENYLEPHRINE 0.2-1 % OP SOLN
OPHTHALMIC | Status: AC
  Administered 2011-06-27: 1 [drp] via OPHTHALMIC
  Filled 2011-06-27: qty 2

## 2011-06-27 MED ORDER — EPINEPHRINE HCL 1 MG/ML IJ SOLN
INTRAMUSCULAR | Status: AC
  Filled 2011-06-27: qty 1

## 2011-06-27 MED ORDER — FENTANYL CITRATE 0.05 MG/ML IJ SOLN: 25.0000 ug | INTRAMUSCULAR | Status: DC | PRN

## 2011-06-27 MED ORDER — LACTATED RINGERS IV SOLN
INTRAVENOUS | Status: DC
  Administered 2011-06-27: 1000 mL via INTRAVENOUS

## 2011-06-27 MED ORDER — MIDAZOLAM HCL 2 MG/2ML IJ SOLN
1.0000 mg | INTRAMUSCULAR | Status: DC | PRN
  Administered 2011-06-27 (×2): 2 mg via INTRAVENOUS

## 2011-06-27 MED ORDER — PHENYLEPHRINE HCL 2.5 % OP SOLN
1.0000 [drp] | OPHTHALMIC | Status: AC
  Administered 2011-06-27 (×3): 1 [drp] via OPHTHALMIC

## 2011-06-27 MED ORDER — LIDOCAINE HCL (PF) 1 % IJ SOLN
INTRAMUSCULAR | Status: DC | PRN
  Administered 2011-06-27: .5 mL

## 2011-06-27 MED ORDER — PROVISC 10 MG/ML IO SOLN
INTRAOCULAR | Status: DC | PRN
  Administered 2011-06-27: 8.5 mg via INTRAOCULAR

## 2011-06-27 MED ORDER — LIDOCAINE HCL 3.5 % OP GEL
1.0000 "application " | Freq: Once | OPHTHALMIC | Status: AC
  Administered 2011-06-27: 1 via OPHTHALMIC

## 2011-06-27 MED ORDER — POVIDONE-IODINE 5 % OP SOLN
OPHTHALMIC | Status: DC | PRN
  Administered 2011-06-27: 1 via OPHTHALMIC

## 2011-06-27 MED ORDER — LIDOCAINE 3.5 % OP GEL OPTIME - NO CHARGE
OPHTHALMIC | Status: DC | PRN
  Administered 2011-06-27: 1 [drp] via OPHTHALMIC

## 2011-06-27 MED ORDER — BSS IO SOLN
INTRAOCULAR | Status: DC | PRN
  Administered 2011-06-27: 15 mL via INTRAOCULAR

## 2011-06-27 MED ORDER — TETRACAINE HCL 0.5 % OP SOLN
1.0000 [drp] | OPHTHALMIC | Status: AC
  Administered 2011-06-27 (×3): 1 [drp] via OPHTHALMIC

## 2011-06-27 MED ORDER — LIDOCAINE HCL (PF) 1 % IJ SOLN
INTRAMUSCULAR | Status: AC
  Filled 2011-06-27: qty 2

## 2011-06-27 MED ORDER — NEOMYCIN-POLYMYXIN-DEXAMETH 3.5-10000-0.1 OP OINT
TOPICAL_OINTMENT | OPHTHALMIC | Status: AC
  Filled 2011-06-27: qty 3.5

## 2011-06-27 MED ORDER — CYCLOPENTOLATE-PHENYLEPHRINE 0.2-1 % OP SOLN
1.0000 [drp] | OPHTHALMIC | Status: AC
  Administered 2011-06-27 (×3): 1 [drp] via OPHTHALMIC

## 2011-06-27 MED ORDER — PHENYLEPHRINE HCL 2.5 % OP SOLN
OPHTHALMIC | Status: AC
  Administered 2011-06-27: 1 [drp] via OPHTHALMIC
  Filled 2011-06-27: qty 2

## 2011-06-27 SURGICAL SUPPLY — 32 items
CAPSULAR TENSION RING-AMO (OPHTHALMIC RELATED) IMPLANT
CLOTH BEACON ORANGE TIMEOUT ST (SAFETY) ×1 IMPLANT
EYE SHIELD UNIVERSAL CLEAR (GAUZE/BANDAGES/DRESSINGS) ×2 IMPLANT
GLOVE BIO SURGEON STRL SZ 6.5 (GLOVE) IMPLANT
GLOVE BIOGEL PI IND STRL 6.5 (GLOVE) IMPLANT
GLOVE BIOGEL PI IND STRL 7.0 (GLOVE) ×1 IMPLANT
GLOVE BIOGEL PI IND STRL 7.5 (GLOVE) IMPLANT
GLOVE BIOGEL PI INDICATOR 6.5 (GLOVE)
GLOVE BIOGEL PI INDICATOR 7.0 (GLOVE) ×1
GLOVE BIOGEL PI INDICATOR 7.5 (GLOVE)
GLOVE ECLIPSE 6.5 STRL STRAW (GLOVE) IMPLANT
GLOVE ECLIPSE 7.0 STRL STRAW (GLOVE) IMPLANT
GLOVE ECLIPSE 7.5 STRL STRAW (GLOVE) IMPLANT
GLOVE EXAM NITRILE LRG STRL (GLOVE) IMPLANT
GLOVE EXAM NITRILE MD LF STRL (GLOVE) ×1 IMPLANT
GLOVE SKINSENSE NS SZ6.5 (GLOVE)
GLOVE SKINSENSE NS SZ7.0 (GLOVE)
GLOVE SKINSENSE STRL SZ6.5 (GLOVE) IMPLANT
GLOVE SKINSENSE STRL SZ7.0 (GLOVE) IMPLANT
KIT VITRECTOMY (OPHTHALMIC RELATED) IMPLANT
PAD ARMBOARD 7.5X6 YLW CONV (MISCELLANEOUS) ×1 IMPLANT
PROC W NO LENS (INTRAOCULAR LENS)
PROC W SPEC LENS (INTRAOCULAR LENS)
PROCESS W NO LENS (INTRAOCULAR LENS) IMPLANT
PROCESS W SPEC LENS (INTRAOCULAR LENS) IMPLANT
RING MALYGIN (MISCELLANEOUS) IMPLANT
SIGHTPATH CAT PROC W REG LENS (Ophthalmic Related) ×2 IMPLANT
SYR TB 1ML LL NO SAFETY (SYRINGE) ×2 IMPLANT
TAPE SURG TRANSPORE 1 IN (GAUZE/BANDAGES/DRESSINGS) IMPLANT
TAPE SURGICAL TRANSPORE 1 IN (GAUZE/BANDAGES/DRESSINGS) ×1
VISCOELASTIC ADDITIONAL (OPHTHALMIC RELATED) IMPLANT
WATER STERILE IRR 250ML POUR (IV SOLUTION) ×2 IMPLANT

## 2011-06-27 NOTE — Anesthesia Procedure Notes (Addendum)
Procedure Name: MAC Date/Time: 06/27/2011 1:12 PM Performed by: Minerva Areola Pre-anesthesia Checklist: Patient identified, Patient being monitored, Emergency Drugs available, Timeout performed and Suction available Patient Re-evaluated:Patient Re-evaluated prior to inductionOxygen Delivery Method: Nasal Cannula

## 2011-06-27 NOTE — H&P (Signed)
I have reviewed the H&P, the patient was re-examined, and I have identified no interval changes in medical condition and plan of care since the history and physical of record  

## 2011-06-27 NOTE — Transfer of Care (Signed)
Immediate Anesthesia Transfer of Care Note  Patient: Caitlin Mccarthy  Procedure(s) Performed:  CATARACT EXTRACTION PHACO AND INTRAOCULAR LENS PLACEMENT (IOC) - CDE:13.96  Patient Location: Shortstay  Anesthesia Type: MAC  Level of Consciousness: awake  Airway & Oxygen Therapy: Patient Spontanous Breathing   Post-op Assessment: Report given to PACU RN, Post -op Vital signs reviewed and stable and Patient moving all extremities  Post vital signs: Reviewed and stable  Complications: No apparent anesthesia complications

## 2011-06-27 NOTE — Brief Op Note (Signed)
Pre-Op Dx: Cataract OS Post-Op Dx: Cataract OS Surgeon: Miara Emminger Anesthesia: Topical with MAC Implant: Lenstec, Model Softec HD Specimen: None Complications: None 

## 2011-06-27 NOTE — Anesthesia Postprocedure Evaluation (Signed)
  Anesthesia Post-op Note  Patient: Caitlin Mccarthy  Procedure(s) Performed:  CATARACT EXTRACTION PHACO AND INTRAOCULAR LENS PLACEMENT (IOC) - CDE:13.96  Patient Location:  Short Stay  Anesthesia Type: MAC  Level of Consciousness: awake  Airway and Oxygen Therapy: Patient Spontanous Breathing  Post-op Pain: none  Post-op Assessment: Post-op Vital signs reviewed, Patient's Cardiovascular Status Stable, Respiratory Function Stable, Patent Airway, No signs of Nausea or vomiting and Pain level controlled  Post-op Vital Signs: Reviewed and stable  Complications: No apparent anesthesia complications

## 2011-06-27 NOTE — Anesthesia Preprocedure Evaluation (Signed)
Anesthesia Evaluation  Patient identified by MRN, date of birth, ID band Patient awake    Reviewed: Allergy & Precautions, H&P , NPO status , Patient's Chart, lab work & pertinent test results  Airway Mallampati: II      Dental  (+) Teeth Intact   Pulmonary shortness of breath and with exertion, Current Smoker,    Pulmonary exam normal       Cardiovascular hypertension, Pt. on medications Regular Normal    Neuro/Psych    GI/Hepatic   Endo/Other    Renal/GU      Musculoskeletal  (+) Arthritis -,   Abdominal   Peds  Hematology   Anesthesia Other Findings   Reproductive/Obstetrics                           Anesthesia Physical Anesthesia Plan  ASA: II  Anesthesia Plan: MAC   Post-op Pain Management:    Induction: Intravenous  Airway Management Planned: Nasal Cannula  Additional Equipment:   Intra-op Plan:   Post-operative Plan:   Informed Consent: I have reviewed the patients History and Physical, chart, labs and discussed the procedure including the risks, benefits and alternatives for the proposed anesthesia with the patient or authorized representative who has indicated his/her understanding and acceptance.     Plan Discussed with:   Anesthesia Plan Comments:         Anesthesia Quick Evaluation

## 2011-06-28 NOTE — Op Note (Signed)
NAME:  Caitlin Mccarthy, Caitlin Mccarthy             ACCOUNT NO.:  [id number]  MEDICAL RECORD NO.:  [id number]  LOCATION:  [facility]PO                          FACILITY:  [facility]  PHYSICIAN:  Susanne Greenhouse, MD       DATE OF BIRTH:  1936/06/09  DATE OF PROCEDURE:  06/27/2011 DATE OF DISCHARGE:  06/27/2011                              OPERATIVE REPORT   PREOPERATIVE DIAGNOSIS:  Combined cataract, left eye, diagnosis code 366.19.  POSTOPERATIVE DIAGNOSIS:  Combined cataract, left eye, diagnosis code 366.19.  OPERATION PERFORMED:  Phacoemulsification with posterior chamber intraocular lens implantation, left eye.  SURGEON:  Bonne Dolores. Kardell Virgil, MD  ANESTHESIA:  General endotracheal anesthesia.  OPERATIVE SUMMARY:  In the preoperative area, dilating drops were placed into the left eye.  The patient was then brought into the operating room where he was placed under general anesthesia.  The eye was then prepped and draped.  Beginning with a 75 blade, a paracentesis port was made at the surgeon's 2 o'clock position.  The anterior chamber was then filled with a 1% nonpreserved lidocaine solution with epinephrine.  This was followed by Viscoat to deepen the chamber.  A small fornix-based peritomy was performed superiorly.  Next, a single iris hook was placed through the limbus superiorly.  A 2.4-mm keratome blade was then used to make a clear corneal incision over the iris hook.  A bent cystotome needle and Utrata forceps were used to create a continuous tear capsulotomy.  Hydrodissection was performed using balanced salt solution on a fine cannula.  The lens nucleus was then removed using phacoemulsification in a quadrant cracking technique.  The cortical material was then removed with irrigation and aspiration.  The capsular bag and anterior chamber were refilled with Provisc.  The wound was widened to approximately 3 mm and a posterior chamber intraocular lens was placed into the capsular bag without difficulty using  an [employer] lens injecting system.  A single 10-0 nylon suture was then used to close the incision as well as stromal hydration.  The Provisc was removed from the anterior chamber and capsular bag with irrigation and aspiration.  At this point, the wounds were tested for leak, which were negative.  The anterior chamber remained deep and stable.  The patient tolerated the procedure well.  There were no operative complications, and he awoke from general anesthesia without problem.  No surgical specimens.  Prosthetic device used is a Lenstec posterior chamber lens, model Softec HD, power of 19.5, serial number is [id number].          ______________________________ Susanne Greenhouse, MD     KEH/MEDQ  D:  06/27/2011  T:  06/28/2011  Job:  [id number]

## 2011-07-03 ENCOUNTER — Encounter (HOSPITAL_COMMUNITY): Payer: Self-pay | Admitting: Ophthalmology

## 2011-09-19 ENCOUNTER — Other Ambulatory Visit: Payer: Self-pay | Admitting: Orthopedic Surgery

## 2011-10-15 ENCOUNTER — Other Ambulatory Visit: Payer: Self-pay | Admitting: Orthopedic Surgery

## 2011-10-15 MED ORDER — DEXAMETHASONE SODIUM PHOSPHATE 10 MG/ML IJ SOLN: 10.0000 mg | Freq: Once | INTRAMUSCULAR | Status: DC

## 2011-10-15 MED ORDER — BUPIVACAINE LIPOSOME 1.3 % IJ SUSP
20.0000 mL | Freq: Once | INTRAMUSCULAR | Status: DC
Start: 2011-10-15 — End: 2011-10-15

## 2011-10-18 ENCOUNTER — Encounter (HOSPITAL_COMMUNITY): Payer: Self-pay | Admitting: Pharmacy Technician

## 2011-10-22 ENCOUNTER — Ambulatory Visit (HOSPITAL_COMMUNITY)
Admission: RE | Admit: 2011-10-22 | Discharge: 2011-10-22 | Disposition: A | Discharge status: Home / Self Care | Payer: Medicare HMO | Source: Ambulatory Visit | Attending: Orthopedic Surgery | Admitting: Orthopedic Surgery

## 2011-10-22 ENCOUNTER — Encounter (HOSPITAL_COMMUNITY): Payer: Self-pay

## 2011-10-22 ENCOUNTER — Encounter (HOSPITAL_COMMUNITY)
Admission: RE | Admit: 2011-10-22 | Discharge: 2011-10-22 | Disposition: A | Discharge status: Home / Self Care | Payer: Medicare HMO | Source: Ambulatory Visit | Attending: Orthopedic Surgery | Admitting: Orthopedic Surgery

## 2011-10-22 DIAGNOSIS — Z01818 Encounter for other preprocedural examination: Secondary | ICD-10-CM | POA: Insufficient documentation

## 2011-10-22 DIAGNOSIS — M1610 Unilateral primary osteoarthritis, unspecified hip: Secondary | ICD-10-CM | POA: Insufficient documentation

## 2011-10-22 DIAGNOSIS — Z01812 Encounter for preprocedural laboratory examination: Secondary | ICD-10-CM | POA: Insufficient documentation

## 2011-10-22 DIAGNOSIS — M169 Osteoarthritis of hip, unspecified: Secondary | ICD-10-CM | POA: Insufficient documentation

## 2011-10-22 HISTORY — DX: Gastro-esophageal reflux disease without esophagitis: K21.9

## 2011-10-22 LAB — COMPREHENSIVE METABOLIC PANEL
AST: 28 U/L (ref 0–37)
Albumin: 4.4 g/dL (ref 3.5–5.2)
BUN: 19 mg/dL (ref 6–23)
Calcium: 11.3 mg/dL — ABNORMAL HIGH (ref 8.4–10.5)
Chloride: 99 mEq/L (ref 96–112)
Creatinine, Ser: 0.63 mg/dL (ref 0.50–1.10)
Total Bilirubin: 0.4 mg/dL (ref 0.3–1.2)
Total Protein: 8.9 g/dL — ABNORMAL HIGH (ref 6.0–8.3)

## 2011-10-22 LAB — URINALYSIS, ROUTINE W REFLEX MICROSCOPIC
Bilirubin Urine: NEGATIVE
Nitrite: NEGATIVE
Specific Gravity, Urine: 1.016 (ref 1.005–1.030)
Urobilinogen, UA: 0.2 mg/dL (ref 0.0–1.0)
pH: 6.5 (ref 5.0–8.0)

## 2011-10-22 LAB — SURGICAL PCR SCREEN
MRSA, PCR: NEGATIVE
Staphylococcus aureus: NEGATIVE

## 2011-10-22 LAB — URINE MICROSCOPIC-ADD ON

## 2011-10-22 LAB — PROTIME-INR
INR: 0.95 (ref 0.00–1.49)
Prothrombin Time: 12.9 seconds (ref 11.6–15.2)

## 2011-10-22 LAB — CBC
HCT: 46.3 % — ABNORMAL HIGH (ref 36.0–46.0)
MCHC: 33.9 g/dL (ref 30.0–36.0)
MCV: 92 fL (ref 78.0–100.0)
Platelets: 225 10*3/uL (ref 150–400)
RDW: 12.6 % (ref 11.5–15.5)
WBC: 7.7 10*3/uL (ref 4.0–10.5)

## 2011-10-22 MED ORDER — SODIUM CHLORIDE 0.9 % IV SOLN: INTRAVENOUS | Status: DC

## 2011-10-22 MED ORDER — CEFAZOLIN SODIUM-DEXTROSE 2-3 GM-% IV SOLR
2.0000 g | INTRAVENOUS | Status: DC
Start: 2011-10-22 — End: 2011-10-22

## 2011-10-22 MED ORDER — CHLORHEXIDINE GLUCONATE 4 % EX LIQD: 60.0000 mL | Freq: Once | CUTANEOUS | Status: DC

## 2011-10-22 MED ORDER — ACETAMINOPHEN 10 MG/ML IV SOLN: 1000.0000 mg | Freq: Once | INTRAVENOUS | Status: DC

## 2011-10-22 NOTE — Pre-Procedure Instructions (Signed)
05/28/11 EKG on chart

## 2011-10-22 NOTE — Pre-Procedure Instructions (Signed)
Paged Kenard Gower, [place] regarding moderate leukocytes and turbid appearance of urinalysis.  Kenard Gower, PA returned page and results reported.

## 2011-10-22 NOTE — Patient Instructions (Signed)
20 Caitlin Mccarthy  10/22/2011   Your procedure is scheduled on:  10/28/11 1235pm-155pm  Report to [facility] at 1000 AM.  Call this number if you have problems the morning of surgery: [phone]   Remember:   Do not eat food:After Midnight.  May have clear liquids:until Midnight .   Take these medicines the morning of surgery with A SIP OF WATER:    Do not wear jewelry, make-up or nail polish.  Do not wear lotions, powders, or perfumes.   Do not shave 48 hours prior to surgery.  Do not bring valuables to the hospital.  Contacts, dentures or bridgework may not be worn into surgery.  Leave suitcase in the car. After surgery it may be brought to your room.  For patients admitted to the hospital, checkout time is 11:00 AM the day of discharge.     Special Instructions: CHG Shower Use Special Wash: 1/2 bottle night before surgery and 1/2 bottle morning of surgery. shower chin to toes with CHG.  Wash face and private parts with regular soap.    Please read over the following fact sheets that you were given: MRSA Information, IIncentive Spirometry Fact Sheet, coughing and deep breathing exercises, leg exercises, Blood Transfusion Fact Sheet

## 2011-10-27 ENCOUNTER — Other Ambulatory Visit: Payer: Self-pay | Admitting: Orthopedic Surgery

## 2011-10-27 NOTE — H&P (Signed)
Caitlin Mccarthy  DOB: Jun 19, 1936 Married / Language: English / Race: White / Female  Date of Admission:  10/28/2011  Chief Complaint:  Right Hip Pain  History of Present Illness The patient is a 76 year old female who comes in for a preoperative History and Physical. The patient is scheduled for a right total hip arthroplasty to be performed by Dr. Gus Rankin. Aluisio, MD at [facility] on 10/28/2011. The patient is a 76 year old female who presents for follow up of their hip. The patient is being followed for their right hip pain and osteoarthritis. She said the hip is getting progressively worse. It hurts at rest and with activity. It is definitely limiting what she can and cannot do. She is hoping to get the surgery moved up. She is ready to proceed with surgery. They have been treated conservatively in the past for the above stated problem and despite conservative measures, they continue to have progressive pain and severe functional limitations and dysfunction. They have failed non-operative management. It is felt that they would benefit from undergoing total joint replacement. Risks and benefits of the procedure have been discussed with the patient and they elect to proceed with surgery. There are no active contraindications to surgery such as ongoing infection or rapidly progressive neurological disease.  Allergies No Known Drug Allergies  Problem List/Past Medical Osteoarthritis, hip (715.35) Anxiety Disorder High blood pressure Skin Cancer Hypercholesterolemia Kidney Stone Osteoarthritis Urinary Tract Infection  Family History Diabetes Mellitus. mother Kidney disease. mother Hypertension. mother Heart Disease. father Heart disease in female family member before age 76  Social History Tobacco / smoke exposure. no Tobacco use. current some days smoker; smoke(d) less than 1/2 pack(s) per day Current work status. retired Aeronautical engineer. yes Marital  status. married Living situation. live with spouse Illicit drug use. no Exercise. Exercises daily; does other Drug/Alcohol Rehab (Previously). no Children. 2 Alcohol use. never consumed alcohol Drug/Alcohol Rehab (Currently). no Post-Surgical Plans. Plan is to go home.  Medication History Amlodipine Besy-Benazepril HCl (10-40MG  Capsule, Oral daily) Active. CeleBREX (200MG  Capsule, Oral) Active. Aspirin Child (81MG  Tablet Chewable, Oral daily) Active. Sertraline HCl (50MG  Tablet, Oral daily) Active. Hydrochlorothiazide (25MG  Tablet, Oral daily) Active. Crestor (20MG  Tablet, Oral daily) Active.  Past Surgical History Total Knee Replacement. bilateral Cataract Surgery. bilateral  Review of Systems General:Present- Night Sweats. Not Present- Chills, Fever, Fatigue, Weight Gain, Weight Loss and Memory Loss. Skin:Not Present- Hives, Itching, Rash, Eczema and Lesions. HEENT:Not Present- Tinnitus, Headache, Double Vision, Visual Loss, Hearing Loss and Dentures. Respiratory:Not Present- Shortness of breath with exertion, Shortness of breath at rest, Allergies, Coughing up blood and Chronic Cough. Cardiovascular:Not Present- Chest Pain, Racing/skipping heartbeats, Difficulty Breathing Lying Down, Murmur, Swelling and Palpitations. Gastrointestinal:Not Present- Bloody Stool, Heartburn, Abdominal Pain, Vomiting, Nausea, Constipation, Diarrhea, Difficulty Swallowing, Jaundice and Loss of appetitie. Female Genitourinary:Present- Urinating at Night. Not Present- Blood in Urine, Urinary frequency, Weak urinary stream, Discharge, Flank Pain, Incontinence, Painful Urination, Urgency and Urinary Retention. Musculoskeletal:Present- Joint Pain and Morning Stiffness. Not Present- Muscle Weakness, Muscle Pain, Joint Swelling, Back Pain and Spasms. Neurological:Not Present- Tremor, Dizziness, Blackout spells, Paralysis, Difficulty with balance and Weakness. Psychiatric:Not Present-  Insomnia.  Vitals Weight: 230 lb Height: 70 in Body Surface Area: 2.27 m Body Mass Index: 33 kg/m Pulse: 88 (Regular) Resp.: 16 (Unlabored) BP: 138/64 (Sitting, Right Arm, Standard)  Physical Exam The physical exam findings are as follows:   General Mental Status - Alert, cooperative and good historian. General Appearance- pleasant.  Not in acute distress. Orientation- Oriented X3. Build & Nutrition- Well nourished and Well developed.   Head and Neck Head- normocephalic, atraumatic . Neck Global Assessment- supple. no bruit auscultated on the right and no bruit auscultated on the left.   Eye Pupil- Bilateral- Regular and Round. Motion- Bilateral- EOMI.   Chest and Lung Exam Auscultation: Breath sounds:- clear at anterior chest wall and - clear at posterior chest wall. Adventitious sounds:- No Adventitious sounds.   Cardiovascular Auscultation:Rhythm- Regular rate and rhythm. Heart Sounds- S1 WNL and S2 WNL. Murmurs & Other Heart Sounds: Murmur 1:Location- Aortic Area. Timing- Early systolic. Grade- II/VI. Character- Low pitched.   Abdomen Inspection:Contour- Generalized mild distention. Palpation/Percussion:Tenderness- Abdomen is non-tender to palpation. Rigidity (guarding)- Abdomen is soft. Auscultation:Auscultation of the abdomen reveals - Bowel sounds normal.   Female Genitourinary  Not done, not pertinent to present illness  Musculoskeletal She is alert and oriented in no apparent distress. Her right hip can be flexed to 90. No internal or external rotation and about only 10 degrees of abduction. Left hip has normal range of motion.  RADIOGRAPHS: Radiographs from last visit and she has bone on bone arthritis of the right hip. She has some erosion of the femoral head.  Assessment & Plan Osteoarthritis Right Hip  Patient is for a Right Total Hip Replacement by Dr. Lequita Halt.  Plan is to go home after the  hospital stay.  PCP - Dr. Rudi Heap - Patient has been seen preoperqtively and felt to be stable for surgery.  Avel Peace, PA-C

## 2011-10-28 ENCOUNTER — Encounter (HOSPITAL_COMMUNITY): Payer: Self-pay | Admitting: *Deleted

## 2011-10-28 ENCOUNTER — Ambulatory Visit (HOSPITAL_COMMUNITY): Payer: Medicare HMO | Admitting: Anesthesiology

## 2011-10-28 ENCOUNTER — Encounter (HOSPITAL_COMMUNITY)
Admission: RE | Disposition: A | Discharge status: Home Health | Payer: Self-pay | Source: Ambulatory Visit | Attending: Orthopedic Surgery

## 2011-10-28 ENCOUNTER — Inpatient Hospital Stay (HOSPITAL_COMMUNITY)
Admission: RE | Admit: 2011-10-28 | Discharge: 2011-10-31 | DRG: 470 | Disposition: A | Discharge status: Home Health | Payer: Medicare HMO | Source: Ambulatory Visit | Attending: Orthopedic Surgery | Admitting: Orthopedic Surgery

## 2011-10-28 ENCOUNTER — Inpatient Hospital Stay (HOSPITAL_COMMUNITY): Payer: Medicare HMO

## 2011-10-28 ENCOUNTER — Encounter (HOSPITAL_COMMUNITY): Payer: Self-pay | Admitting: Anesthesiology

## 2011-10-28 DIAGNOSIS — Z96649 Presence of unspecified artificial hip joint: Secondary | ICD-10-CM

## 2011-10-28 DIAGNOSIS — M1610 Unilateral primary osteoarthritis, unspecified hip: Principal | ICD-10-CM | POA: Diagnosis present

## 2011-10-28 DIAGNOSIS — F17200 Nicotine dependence, unspecified, uncomplicated: Secondary | ICD-10-CM | POA: Diagnosis present

## 2011-10-28 DIAGNOSIS — M169 Osteoarthritis of hip, unspecified: Principal | ICD-10-CM | POA: Diagnosis present

## 2011-10-28 DIAGNOSIS — I10 Essential (primary) hypertension: Secondary | ICD-10-CM | POA: Diagnosis present

## 2011-10-28 DIAGNOSIS — K219 Gastro-esophageal reflux disease without esophagitis: Secondary | ICD-10-CM | POA: Diagnosis present

## 2011-10-28 DIAGNOSIS — K5900 Constipation, unspecified: Secondary | ICD-10-CM | POA: Diagnosis not present

## 2011-10-28 HISTORY — PX: TOTAL HIP ARTHROPLASTY: SHX124

## 2011-10-28 LAB — TYPE AND SCREEN: ABO/RH(D): O POS

## 2011-10-28 SURGERY — ARTHROPLASTY, HIP, TOTAL,POSTERIOR APPROACH
Anesthesia: General | Site: Hip | Laterality: Right | Wound class: Clean

## 2011-10-28 MED ORDER — TEMAZEPAM 15 MG PO CAPS: 15.0000 mg | ORAL_CAPSULE | Freq: Every evening | ORAL | Status: DC | PRN

## 2011-10-28 MED ORDER — ACETAMINOPHEN 650 MG RE SUPP: 650.0000 mg | Freq: Four times a day (QID) | RECTAL | Status: DC | PRN

## 2011-10-28 MED ORDER — RIVAROXABAN 10 MG PO TABS
10.0000 mg | ORAL_TABLET | Freq: Every day | ORAL | Status: DC
  Administered 2011-10-29 – 2011-10-31 (×3): 10 mg via ORAL
  Filled 2011-10-28 (×4): qty 1

## 2011-10-28 MED ORDER — GLYCOPYRROLATE 0.2 MG/ML IJ SOLN
INTRAMUSCULAR | Status: DC | PRN
  Administered 2011-10-28: .5 mg via INTRAVENOUS

## 2011-10-28 MED ORDER — METHOCARBAMOL 100 MG/ML IJ SOLN
500.0000 mg | Freq: Four times a day (QID) | INTRAVENOUS | Status: DC | PRN
  Administered 2011-10-28: 500 mg via INTRAVENOUS
  Filled 2011-10-28: qty 5

## 2011-10-28 MED ORDER — AMLODIPINE BESYLATE 10 MG PO TABS
10.0000 mg | ORAL_TABLET | Freq: Every day | ORAL | Status: DC
  Administered 2011-10-29 – 2011-10-31 (×3): 10 mg via ORAL
  Filled 2011-10-28 (×4): qty 1

## 2011-10-28 MED ORDER — DIPHENHYDRAMINE HCL 12.5 MG/5ML PO ELIX: 12.5000 mg | ORAL_SOLUTION | ORAL | Status: DC | PRN

## 2011-10-28 MED ORDER — ONDANSETRON HCL 4 MG/2ML IJ SOLN
INTRAMUSCULAR | Status: DC | PRN
  Administered 2011-10-28: 4 mg via INTRAVENOUS

## 2011-10-28 MED ORDER — 0.9 % SODIUM CHLORIDE (POUR BTL) OPTIME
TOPICAL | Status: DC | PRN
  Administered 2011-10-28: 1000 mL

## 2011-10-28 MED ORDER — TRAMADOL HCL 50 MG PO TABS: 50.0000 mg | ORAL_TABLET | Freq: Four times a day (QID) | ORAL | Status: DC | PRN

## 2011-10-28 MED ORDER — AMLODIPINE BESY-BENAZEPRIL HCL 10-40 MG PO CAPS: 1.0000 | ORAL_CAPSULE | Freq: Every day | ORAL | Status: DC

## 2011-10-28 MED ORDER — MENTHOL 3 MG MT LOZG
1.0000 | LOZENGE | OROMUCOSAL | Status: DC | PRN
  Filled 2011-10-28: qty 9

## 2011-10-28 MED ORDER — SODIUM CHLORIDE 0.9 % IJ SOLN
INTRAMUSCULAR | Status: DC | PRN
  Administered 2011-10-28: 50 mL

## 2011-10-28 MED ORDER — METHOCARBAMOL 500 MG PO TABS
500.0000 mg | ORAL_TABLET | Freq: Four times a day (QID) | ORAL | Status: DC | PRN
  Administered 2011-10-28 – 2011-10-31 (×6): 500 mg via ORAL
  Filled 2011-10-28 (×7): qty 1

## 2011-10-28 MED ORDER — BUPIVACAINE LIPOSOME 1.3 % IJ SUSP
INTRAMUSCULAR | Status: DC | PRN
  Administered 2011-10-28: 20 mL

## 2011-10-28 MED ORDER — POTASSIUM CHLORIDE IN NACL 20-0.9 MEQ/L-% IV SOLN
INTRAVENOUS | Status: DC
  Administered 2011-10-28: 75 mL/h via INTRAVENOUS
  Administered 2011-10-29: 06:00:00 via INTRAVENOUS
  Filled 2011-10-28 (×2): qty 1000

## 2011-10-28 MED ORDER — SERTRALINE HCL 50 MG PO TABS
50.0000 mg | ORAL_TABLET | Freq: Every day | ORAL | Status: DC
  Administered 2011-10-29 – 2011-10-31 (×3): 50 mg via ORAL
  Filled 2011-10-28 (×4): qty 1

## 2011-10-28 MED ORDER — POLYETHYLENE GLYCOL 3350 17 G PO PACK
17.0000 g | PACK | Freq: Every day | ORAL | Status: DC | PRN
  Filled 2011-10-28: qty 1

## 2011-10-28 MED ORDER — MORPHINE SULFATE 2 MG/ML IJ SOLN
1.0000 mg | INTRAMUSCULAR | Status: DC | PRN
  Administered 2011-10-28: 2 mg via INTRAVENOUS
  Filled 2011-10-28 (×2): qty 1

## 2011-10-28 MED ORDER — ATORVASTATIN CALCIUM 40 MG PO TABS
40.0000 mg | ORAL_TABLET | Freq: Every day | ORAL | Status: DC
  Administered 2011-10-28 – 2011-10-29 (×2): 40 mg via ORAL
  Filled 2011-10-28 (×4): qty 1

## 2011-10-28 MED ORDER — ACETAMINOPHEN 10 MG/ML IV SOLN
INTRAVENOUS | Status: AC
  Filled 2011-10-28: qty 100

## 2011-10-28 MED ORDER — DEXAMETHASONE SODIUM PHOSPHATE 10 MG/ML IJ SOLN
10.0000 mg | Freq: Once | INTRAMUSCULAR | Status: AC
  Administered 2011-10-28: 10 mg via INTRAVENOUS
  Filled 2011-10-28: qty 1

## 2011-10-28 MED ORDER — PHENOL 1.4 % MT LIQD
1.0000 | OROMUCOSAL | Status: DC | PRN
  Filled 2011-10-28: qty 177

## 2011-10-28 MED ORDER — CHLORHEXIDINE GLUCONATE 4 % EX LIQD
60.0000 mL | Freq: Once | CUTANEOUS | Status: DC
  Filled 2011-10-28: qty 60

## 2011-10-28 MED ORDER — BENAZEPRIL HCL 40 MG PO TABS
40.0000 mg | ORAL_TABLET | Freq: Every day | ORAL | Status: DC
  Administered 2011-10-29 – 2011-10-31 (×3): 40 mg via ORAL
  Filled 2011-10-28 (×4): qty 1

## 2011-10-28 MED ORDER — ROCURONIUM BROMIDE 100 MG/10ML IV SOLN
INTRAVENOUS | Status: DC | PRN
  Administered 2011-10-28: 50 mg via INTRAVENOUS

## 2011-10-28 MED ORDER — HYDROCHLOROTHIAZIDE 25 MG PO TABS
25.0000 mg | ORAL_TABLET | Freq: Every day | ORAL | Status: DC
  Administered 2011-10-29 – 2011-10-31 (×3): 25 mg via ORAL
  Filled 2011-10-28 (×4): qty 1

## 2011-10-28 MED ORDER — FLEET ENEMA 7-19 GM/118ML RE ENEM: 1.0000 | ENEMA | Freq: Once | RECTAL | Status: AC | PRN

## 2011-10-28 MED ORDER — PROPOFOL 10 MG/ML IV BOLUS
INTRAVENOUS | Status: DC | PRN
  Administered 2011-10-28: 150 mg via INTRAVENOUS

## 2011-10-28 MED ORDER — CEFAZOLIN SODIUM-DEXTROSE 2-3 GM-% IV SOLR
2.0000 g | INTRAVENOUS | Status: AC
  Administered 2011-10-28: 2 g via INTRAVENOUS

## 2011-10-28 MED ORDER — ACETAMINOPHEN 10 MG/ML IV SOLN
1000.0000 mg | Freq: Four times a day (QID) | INTRAVENOUS | Status: AC
  Administered 2011-10-28 – 2011-10-29 (×4): 1000 mg via INTRAVENOUS
  Filled 2011-10-28 (×6): qty 100

## 2011-10-28 MED ORDER — ONDANSETRON HCL 4 MG/2ML IJ SOLN: 4.0000 mg | Freq: Four times a day (QID) | INTRAMUSCULAR | Status: DC | PRN

## 2011-10-28 MED ORDER — ACETAMINOPHEN 10 MG/ML IV SOLN
1000.0000 mg | Freq: Once | INTRAVENOUS | Status: AC
  Administered 2011-10-28: 1000 mg via INTRAVENOUS
  Filled 2011-10-28: qty 100

## 2011-10-28 MED ORDER — FENTANYL CITRATE 0.05 MG/ML IJ SOLN
INTRAMUSCULAR | Status: DC | PRN
  Administered 2011-10-28 (×2): 100 ug via INTRAVENOUS
  Administered 2011-10-28: 50 ug via INTRAVENOUS

## 2011-10-28 MED ORDER — BUPIVACAINE LIPOSOME 1.3 % IJ SUSP
20.0000 mL | Freq: Once | INTRAMUSCULAR | Status: DC
  Filled 2011-10-28: qty 20

## 2011-10-28 MED ORDER — SODIUM CHLORIDE 0.9 % IV SOLN: INTRAVENOUS | Status: DC

## 2011-10-28 MED ORDER — DOCUSATE SODIUM 100 MG PO CAPS
100.0000 mg | ORAL_CAPSULE | Freq: Two times a day (BID) | ORAL | Status: DC
  Administered 2011-10-28 – 2011-10-31 (×7): 100 mg via ORAL
  Filled 2011-10-28 (×8): qty 1

## 2011-10-28 MED ORDER — LACTATED RINGERS IV SOLN
INTRAVENOUS | Status: DC
  Administered 2011-10-28: 13:00:00 via INTRAVENOUS
  Administered 2011-10-28: 1000 mL via INTRAVENOUS
  Administered 2011-10-28: 13:00:00 via INTRAVENOUS

## 2011-10-28 MED ORDER — NEOSTIGMINE METHYLSULFATE 1 MG/ML IJ SOLN
INTRAMUSCULAR | Status: DC | PRN
  Administered 2011-10-28: 3.5 mg via INTRAVENOUS

## 2011-10-28 MED ORDER — LIDOCAINE HCL (CARDIAC) 20 MG/ML IV SOLN
INTRAVENOUS | Status: DC | PRN
  Administered 2011-10-28: 50 mg via INTRAVENOUS

## 2011-10-28 MED ORDER — OXYCODONE HCL 5 MG PO TABS
5.0000 mg | ORAL_TABLET | ORAL | Status: DC | PRN
  Administered 2011-10-28 – 2011-10-30 (×8): 10 mg via ORAL
  Administered 2011-10-30: 5 mg via ORAL
  Administered 2011-10-30 (×2): 10 mg via ORAL
  Filled 2011-10-28: qty 2
  Filled 2011-10-28: qty 1
  Filled 2011-10-28 (×3): qty 2
  Filled 2011-10-28: qty 1
  Filled 2011-10-28 (×5): qty 2
  Filled 2011-10-28: qty 1
  Filled 2011-10-28: qty 2

## 2011-10-28 MED ORDER — CEFAZOLIN SODIUM 1-5 GM-% IV SOLN
INTRAVENOUS | Status: AC
  Filled 2011-10-28: qty 100

## 2011-10-28 MED ORDER — CEFAZOLIN SODIUM 1-5 GM-% IV SOLN
1.0000 g | Freq: Four times a day (QID) | INTRAVENOUS | Status: AC
  Administered 2011-10-28 – 2011-10-29 (×3): 1 g via INTRAVENOUS
  Filled 2011-10-28 (×3): qty 50

## 2011-10-28 MED ORDER — ZOLPIDEM TARTRATE 5 MG PO TABS: 5.0000 mg | ORAL_TABLET | Freq: Every evening | ORAL | Status: DC | PRN

## 2011-10-28 MED ORDER — METOCLOPRAMIDE HCL 10 MG PO TABS: 5.0000 mg | ORAL_TABLET | Freq: Three times a day (TID) | ORAL | Status: DC | PRN

## 2011-10-28 MED ORDER — BISACODYL 10 MG RE SUPP
10.0000 mg | Freq: Every day | RECTAL | Status: DC | PRN
  Administered 2011-10-31: 10 mg via RECTAL
  Filled 2011-10-28: qty 1

## 2011-10-28 MED ORDER — ACETAMINOPHEN 325 MG PO TABS: 650.0000 mg | ORAL_TABLET | Freq: Four times a day (QID) | ORAL | Status: DC | PRN

## 2011-10-28 MED ORDER — ONDANSETRON HCL 4 MG PO TABS
4.0000 mg | ORAL_TABLET | Freq: Four times a day (QID) | ORAL | Status: DC | PRN
  Administered 2011-10-31: 4 mg via ORAL
  Filled 2011-10-28: qty 1

## 2011-10-28 MED ORDER — METOCLOPRAMIDE HCL 5 MG/ML IJ SOLN: 5.0000 mg | Freq: Three times a day (TID) | INTRAMUSCULAR | Status: DC | PRN

## 2011-10-28 MED ORDER — HYDROMORPHONE HCL PF 1 MG/ML IJ SOLN
INTRAMUSCULAR | Status: DC | PRN
  Administered 2011-10-28 (×2): 0.5 mg via INTRAVENOUS
  Administered 2011-10-28: 1 mg via INTRAVENOUS

## 2011-10-28 SURGICAL SUPPLY — 54 items
BAG SPEC THK2 15X12 ZIP CLS (MISCELLANEOUS) ×1
BAG ZIPLOCK 12X15 (MISCELLANEOUS) ×2 IMPLANT
BIT DRILL 2.8X128 (BIT) ×2 IMPLANT
BLADE EXTENDED COATED 6.5IN (ELECTRODE) ×2 IMPLANT
BLADE SAW SAG 73X25 THK (BLADE) ×1
BLADE SAW SGTL 73X25 THK (BLADE) ×1 IMPLANT
CLOTH BEACON ORANGE TIMEOUT ST (SAFETY) ×2 IMPLANT
DECANTER SPIKE VIAL GLASS SM (MISCELLANEOUS) ×2 IMPLANT
DRAPE INCISE IOBAN 66X45 STRL (DRAPES) ×2 IMPLANT
DRAPE ORTHO SPLIT 77X108 STRL (DRAPES) ×4
DRAPE POUCH INSTRU U-SHP 10X18 (DRAPES) ×2 IMPLANT
DRAPE SURG ORHT 6 SPLT 77X108 (DRAPES) ×2 IMPLANT
DRAPE U-SHAPE 47X51 STRL (DRAPES) ×2 IMPLANT
DRSG ADAPTIC 3X8 NADH LF (GAUZE/BANDAGES/DRESSINGS) ×2 IMPLANT
DRSG MEPILEX BORDER 4X4 (GAUZE/BANDAGES/DRESSINGS) ×2 IMPLANT
DRSG MEPILEX BORDER 4X8 (GAUZE/BANDAGES/DRESSINGS) ×2 IMPLANT
DURAPREP 26ML APPLICATOR (WOUND CARE) ×2 IMPLANT
ELECT REM PT RETURN 9FT ADLT (ELECTROSURGICAL) ×2
ELECTRODE REM PT RTRN 9FT ADLT (ELECTROSURGICAL) ×1 IMPLANT
EVACUATOR 1/8 PVC DRAIN (DRAIN) ×2 IMPLANT
FACESHIELD LNG OPTICON STERILE (SAFETY) ×8 IMPLANT
GLOVE BIO SURGEON STRL SZ7.5 (GLOVE) ×2 IMPLANT
GLOVE BIO SURGEON STRL SZ8 (GLOVE) ×2 IMPLANT
GLOVE BIOGEL PI IND STRL 6.5 (GLOVE) ×1 IMPLANT
GLOVE BIOGEL PI IND STRL 8 (GLOVE) ×2 IMPLANT
GLOVE BIOGEL PI INDICATOR 6.5 (GLOVE) ×1
GLOVE BIOGEL PI INDICATOR 8 (GLOVE) ×2
GLOVE SURG SS PI 6.5 STRL IVOR (GLOVE) ×6 IMPLANT
GOWN STRL NON-REIN LRG LVL3 (GOWN DISPOSABLE) ×3 IMPLANT
GOWN STRL REIN XL XLG (GOWN DISPOSABLE) ×4 IMPLANT
IMMOBILIZER KNEE 20 (SOFTGOODS) ×2
IMMOBILIZER KNEE 20 THIGH 36 (SOFTGOODS) ×1 IMPLANT
KIT BASIN OR (CUSTOM PROCEDURE TRAY) ×2 IMPLANT
MANIFOLD NEPTUNE II (INSTRUMENTS) ×2 IMPLANT
NDL SAFETY ECLIPSE 18X1.5 (NEEDLE) ×1 IMPLANT
NEEDLE HYPO 18GX1.5 SHARP (NEEDLE) ×2
NS IRRIG 1000ML POUR BTL (IV SOLUTION) ×2 IMPLANT
PACK TOTAL JOINT (CUSTOM PROCEDURE TRAY) ×2 IMPLANT
PASSER SUT SWANSON 36MM LOOP (INSTRUMENTS) ×2 IMPLANT
POSITIONER SURGICAL ARM (MISCELLANEOUS) ×2 IMPLANT
SPONGE GAUZE 4X4 12PLY (GAUZE/BANDAGES/DRESSINGS) ×2 IMPLANT
STRIP CLOSURE SKIN 1/2X4 (GAUZE/BANDAGES/DRESSINGS) ×4 IMPLANT
SUT ETHIBOND NAB CT1 #1 30IN (SUTURE) ×4 IMPLANT
SUT MNCRL AB 4-0 PS2 18 (SUTURE) ×2 IMPLANT
SUT VIC AB 1 CT1 27 (SUTURE) ×4
SUT VIC AB 1 CT1 27XBRD ANTBC (SUTURE) ×2 IMPLANT
SUT VIC AB 2-0 CT1 27 (SUTURE) ×6
SUT VIC AB 2-0 CT1 TAPERPNT 27 (SUTURE) ×3 IMPLANT
SUT VLOC 180 0 24IN GS25 (SUTURE) ×4 IMPLANT
SYR 50ML LL SCALE MARK (SYRINGE) ×2 IMPLANT
TOWEL OR 17X26 10 PK STRL BLUE (TOWEL DISPOSABLE) ×4 IMPLANT
TOWEL OR NON WOVEN STRL DISP B (DISPOSABLE) ×2 IMPLANT
TRAY FOLEY CATH 14FRSI W/METER (CATHETERS) ×2 IMPLANT
WATER STERILE IRR 1500ML POUR (IV SOLUTION) ×2 IMPLANT

## 2011-10-28 NOTE — H&P (View-Only) (Signed)
Caitlin Mccarthy  DOB: 01/21/1936 Married / Language: English / Race: White / Female  Date of Admission:  10/28/2011  Chief Complaint:  Right Hip Pain  History of Present Illness The patient is a 75 year old female who comes in for a preoperative History and Physical. The patient is scheduled for a right total hip arthroplasty to be performed by Dr. Frank V. Aluisio, MD at [facility] on 10/28/2011. The patient is a 75 year old female who presents for follow up of their hip. The patient is being followed for their right hip pain and osteoarthritis. She said the hip is getting progressively worse. It hurts at rest and with activity. It is definitely limiting what she can and cannot do. She is hoping to get the surgery moved up. She is ready to proceed with surgery. They have been treated conservatively in the past for the above stated problem and despite conservative measures, they continue to have progressive pain and severe functional limitations and dysfunction. They have failed non-operative management. It is felt that they would benefit from undergoing total joint replacement. Risks and benefits of the procedure have been discussed with the patient and they elect to proceed with surgery. There are no active contraindications to surgery such as ongoing infection or rapidly progressive neurological disease.  Allergies No Known Drug Allergies  Problem List/Past Medical Osteoarthritis, hip (715.35) Anxiety Disorder High blood pressure Skin Cancer Hypercholesterolemia Kidney Stone Osteoarthritis Urinary Tract Infection  Family History Diabetes Mellitus. mother Kidney disease. mother Hypertension. mother Heart Disease. father Heart disease in female family member before age 55  Social History Tobacco / smoke exposure. no Tobacco use. current some days smoker; smoke(d) less than 1/2 pack(s) per day Current work status. retired Pain Contract. yes Marital  status. married Living situation. live with spouse Illicit drug use. no Exercise. Exercises daily; does other Drug/Alcohol Rehab (Previously). no Children. 2 Alcohol use. never consumed alcohol Drug/Alcohol Rehab (Currently). no Post-Surgical Plans. Plan is to go home.  Medication History Amlodipine Besy-Benazepril HCl (10-40MG Capsule, Oral daily) Active. CeleBREX (200MG Capsule, Oral) Active. Aspirin Child (81MG Tablet Chewable, Oral daily) Active. Sertraline HCl (50MG Tablet, Oral daily) Active. Hydrochlorothiazide (25MG Tablet, Oral daily) Active. Crestor (20MG Tablet, Oral daily) Active.  Past Surgical History Total Knee Replacement. bilateral Cataract Surgery. bilateral  Review of Systems General:Present- Night Sweats. Not Present- Chills, Fever, Fatigue, Weight Gain, Weight Loss and Memory Loss. Skin:Not Present- Hives, Itching, Rash, Eczema and Lesions. HEENT:Not Present- Tinnitus, Headache, Double Vision, Visual Loss, Hearing Loss and Dentures. Respiratory:Not Present- Shortness of breath with exertion, Shortness of breath at rest, Allergies, Coughing up blood and Chronic Cough. Cardiovascular:Not Present- Chest Pain, Racing/skipping heartbeats, Difficulty Breathing Lying Down, Murmur, Swelling and Palpitations. Gastrointestinal:Not Present- Bloody Stool, Heartburn, Abdominal Pain, Vomiting, Nausea, Constipation, Diarrhea, Difficulty Swallowing, Jaundice and Loss of appetitie. Female Genitourinary:Present- Urinating at Night. Not Present- Blood in Urine, Urinary frequency, Weak urinary stream, Discharge, Flank Pain, Incontinence, Painful Urination, Urgency and Urinary Retention. Musculoskeletal:Present- Joint Pain and Morning Stiffness. Not Present- Muscle Weakness, Muscle Pain, Joint Swelling, Back Pain and Spasms. Neurological:Not Present- Tremor, Dizziness, Blackout spells, Paralysis, Difficulty with balance and Weakness. Psychiatric:Not Present-  Insomnia.  Vitals Weight: 230 lb Height: 70 in Body Surface Area: 2.27 m Body Mass Index: 33 kg/m Pulse: 88 (Regular) Resp.: 16 (Unlabored) BP: 138/64 (Sitting, Right Arm, Standard)  Physical Exam The physical exam findings are as follows:   General Mental Status - Alert, cooperative and good historian. General Appearance- pleasant.   Not in acute distress. Orientation- Oriented X3. Build & Nutrition- Well nourished and Well developed.   Head and Neck Head- normocephalic, atraumatic . Neck Global Assessment- supple. no bruit auscultated on the right and no bruit auscultated on the left.   Eye Pupil- Bilateral- Regular and Round. Motion- Bilateral- EOMI.   Chest and Lung Exam Auscultation: Breath sounds:- clear at anterior chest wall and - clear at posterior chest wall. Adventitious sounds:- No Adventitious sounds.   Cardiovascular Auscultation:Rhythm- Regular rate and rhythm. Heart Sounds- S1 WNL and S2 WNL. Murmurs & Other Heart Sounds: Murmur 1:Location- Aortic Area. Timing- Early systolic. Grade- II/VI. Character- Low pitched.   Abdomen Inspection:Contour- Generalized mild distention. Palpation/Percussion:Tenderness- Abdomen is non-tender to palpation. Rigidity (guarding)- Abdomen is soft. Auscultation:Auscultation of the abdomen reveals - Bowel sounds normal.   Female Genitourinary  Not done, not pertinent to present illness  Musculoskeletal She is alert and oriented in no apparent distress. Her right hip can be flexed to 90. No internal or external rotation and about only 10 degrees of abduction. Left hip has normal range of motion.  RADIOGRAPHS: Radiographs from last visit and she has bone on bone arthritis of the right hip. She has some erosion of the femoral head.  Assessment & Plan Osteoarthritis Right Hip  Patient is for a Right Total Hip Replacement by Dr. Aluisio.  Plan is to go home after the  hospital stay.  PCP - Dr. Donald Moore - Patient has been seen preoperqtively and felt to be stable for surgery.  Drew Johnhenry Tippin, PA-C  

## 2011-10-28 NOTE — Transfer of Care (Signed)
Immediate Anesthesia Transfer of Care Note  Patient: Caitlin Mccarthy  Procedure(s) Performed: Procedure(s) (LRB): TOTAL HIP ARTHROPLASTY (Right)  Patient Location: PACU  Anesthesia Type: General  Level of Consciousness: awake, sedated and patient cooperative  Airway & Oxygen Therapy: Patient Spontanous Breathing and Patient connected to face mask oxygen  Post-op Assessment: Report given to PACU RN and Post -op Vital signs reviewed and stable  Post vital signs: Reviewed and stable  Complications: No apparent anesthesia complications

## 2011-10-28 NOTE — Op Note (Signed)
Pre-operative diagnosis- Osteoarthritis Right hip  Post-operative diagnosis- Osteoarthritis  Right hip  Procedure-  RightTotal Hip Arthroplasty  Surgeon- Gus Rankin. Anothony Bursch, MD  Assistant- Avel Peace, PA-C   Anesthesia  General  EBL- 500   Drain Hemovac   Complication- None  Condition-PACU - hemodynamically stable.   Brief Clinical Note-  Caitlin Mccarthy is a 76 y.o. female with end stage arthritis of her right hip with progressively worsening pain and dysfunction. Pain occurs with activity and rest including pain at night. She has tried analgesics, protected weight bearing and rest without benefit. Pain is too severe to attempt physical therapy. Radiographs demonstrate bone on bone arthritis with subchondral cyst formation. She presents now for right THA.  Procedure in detail-   The patient is brought into the operating room and placed on the operating table. After successful administration of General  anesthesia, the patient is placed in the  Left lateral decubitus position with the  Right side up and held in place with the hip positioner. The lower extremity is isolated from the perineum with plastic drapes and time-out is performed by the surgical team. The lower extremity is then prepped and draped in the usual sterile fashion. A short posterolateral incision is made with a ten blade through the subcutaneous tissue to the level of the fascia lata which is incised in line with the skin incision. The sciatic nerve is palpated and protected and the short external rotators and capsule are isolated from the femur. The hip is then dislocated and the center of the femoral head is marked. A trial prosthesis is placed such that the trial head corresponds to the center of the patients' native femoral head. The resection level is marked on the femoral neck and the resection is made with an oscillating saw. The femoral head is removed and femoral retractors placed to gain access to the femoral  canal.      The canal finder is passed into the femoral canal and the canal is thoroughly irrigated with sterile saline to remove the fatty contents. Axial reaming is performed to 15.5  mm, proximal reaming to 26F  and the sleeve machined to a large. A 26F large trial sleeve is placed into the proximal femur.      The femur is then retracted anteriorly to gain acetabular exposure. Acetabular retractors are placed and the labrum and osteophytes are removed, Acetabular reaming is performed to 51  mm and a 52  mm Pinnacle acetabular shell is placed in anatomic position with excellent purchase. Additional dome screws were not needed. An apex hole eliminator is placed and the permanent 32 mm neutral + 4 Marathon liner is placed into the acetabular shell.      The trial femur is then placed into the femoral canal. The size is 20 x 15  stem with a 36 + 12  neck and a 32 + 6 head with the neck version matching  the patients' native anteversion. The hip is reduced with excellent stability with full extension and full external rotation, 70 degrees flexion with 40 degrees adduction and 90 degrees internal rotation and 90 degrees of flexion with 70 degrees of internal rotation. The operative leg is placed on top of the non-operative leg and the leg lengths are found to be equal. The trials are then removed and the permanent implant of the same size is impacted into the femoral canal. The ceramic femoral head of the same size as the trial is placed and the hip  is reduced with the same stability parameters. The operative leg is again placed on top of the non-operative leg and the leg lengths are found to be equal.      The wound is then copiously irrigated with saline solution and the capsule and short external rotators are re-attached to the femur through drill holes with Ethibond suture. The fascia lata is closed over a hemovac drain with #1 vicryl suture and the fascia lata, gluteal muscles and subcutaneous tissues are  injected with Exparel 20ml diluted with saline 50ml. The subcutaneous tissues are closed with #1 and2-0 vicryl and the subcuticular layer closed with running 4-0 Monocryl. The drain is hooked to suction, incision cleaned and dried, and steri-srips and a bulky sterile dressing applied. The limb is placed into a knee immobilizer and the patient is awakened and transported to recovery in stable condition.      Please note that a surgical assistant was a medical necessity for this procedure in order to perform it in a safe and expeditious manner. The assistant was necessary to provide retraction to the vital neurovascular structures and to retract and position the limb to allow for anatomic placement of the prosthetic components.  Gus Rankin Erik Burkett, MD    10/28/2011, 1:22 PM

## 2011-10-28 NOTE — Progress Notes (Signed)
Utilization review completed.  

## 2011-10-28 NOTE — Interval H&P Note (Signed)
History and Physical Interval Note:  10/28/2011 12:07 PM  Caitlin Mccarthy  has presented today for surgery, with the diagnosis of Osteoarthritis of the Right Hip  The various methods of treatment have been discussed with the patient and family. After consideration of risks, benefits and other options for treatment, the patient has consented to  Procedure(s) (LRB): TOTAL HIP ARTHROPLASTY (Right) as a surgical intervention .  The patients' history has been reviewed, patient examined, no change in status, stable for surgery.  I have reviewed the patients' chart and labs.  Questions were answered to the patient's satisfaction.     Loanne Drilling

## 2011-10-28 NOTE — Plan of Care (Signed)
Problem: Consults Goal: Diagnosis- Total Joint Replacement Primary Total Hip     

## 2011-10-28 NOTE — Anesthesia Preprocedure Evaluation (Addendum)
Anesthesia Evaluation  Patient identified by MRN, date of birth, ID band Patient awake    Reviewed: Allergy & Precautions, H&P , NPO status , Patient's Chart, lab work & pertinent test results  Airway Mallampati: II TM Distance: <3 FB Neck ROM: Full    Dental No notable dental hx.    Pulmonary Current Smoker,  breath sounds clear to auscultation  Pulmonary exam normal       Cardiovascular Exercise Tolerance: Poor hypertension, Pt. on medications Rhythm:Regular Rate:Normal     Neuro/Psych negative neurological ROS  negative psych ROS   GI/Hepatic Neg liver ROS, GERD-  Medicated,  Endo/Other  negative endocrine ROS  Renal/GU negative Renal ROS  negative genitourinary   Musculoskeletal negative musculoskeletal ROS (+)   Abdominal   Peds negative pediatric ROS (+)  Hematology negative hematology ROS (+)   Anesthesia Other Findings   Reproductive/Obstetrics negative OB ROS                          Anesthesia Physical Anesthesia Plan  ASA: II  Anesthesia Plan: General and General ETT   Post-op Pain Management:    Induction: Intravenous  Airway Management Planned: Oral ETT  Additional Equipment:   Intra-op Plan:   Post-operative Plan: Extubation in OR  Informed Consent: I have reviewed the patients History and Physical, chart, labs and discussed the procedure including the risks, benefits and alternatives for the proposed anesthesia with the patient or authorized representative who has indicated his/her understanding and acceptance.   Dental advisory given  Plan Discussed with: CRNA  Anesthesia Plan Comments:        Anesthesia Quick Evaluation

## 2011-10-28 NOTE — Anesthesia Postprocedure Evaluation (Signed)
  Anesthesia Post-op Note  Patient: Caitlin Mccarthy  Procedure(s) Performed: Procedure(s) (LRB): TOTAL HIP ARTHROPLASTY (Right)  Patient Location: PACU  Anesthesia Type: General  Level of Consciousness: awake and alert   Airway and Oxygen Therapy: Patient Spontanous Breathing  Post-op Pain: mild  Post-op Assessment: Post-op Vital signs reviewed, Patient's Cardiovascular Status Stable, Respiratory Function Stable, Patent Airway and No signs of Nausea or vomiting  Post-op Vital Signs: stable  Complications: No apparent anesthesia complications

## 2011-10-29 LAB — CBC
MCHC: 32.7 g/dL (ref 30.0–36.0)
MCV: 92.1 fL (ref 78.0–100.0)
Platelets: 199 10*3/uL (ref 150–400)
RDW: 12.6 % (ref 11.5–15.5)
WBC: 11.1 10*3/uL — ABNORMAL HIGH (ref 4.0–10.5)

## 2011-10-29 LAB — BASIC METABOLIC PANEL
BUN: 16 mg/dL (ref 6–23)
Calcium: 9.5 mg/dL (ref 8.4–10.5)
Chloride: 103 mEq/L (ref 96–112)
Creatinine, Ser: 0.52 mg/dL (ref 0.50–1.10)
GFR calc Af Amer: >90 mL/min (ref >90)
GFR calc non Af Amer: >90 mL/min (ref >90)

## 2011-10-29 NOTE — Progress Notes (Signed)
Physical Therapy Treatment Note    10/29/11 1500  PT Visit Information  Last PT Received On 10/29/11  Assistance Needed +1  PT Time Calculation  PT Start Time 1416  PT Stop Time 1435  PT Time Calculation (min) 19 min  Subjective Data  Subjective "Oh it's sore in my groin."  (during exercises)  Precautions  Precautions Posterior Hip  Restrictions  Weight Bearing Restrictions Yes  RLE Weight Bearing PWB  RLE Partial Weight Bearing Percentage or Pounds 25-50%  Cognition  Overall Cognitive Status Appears within functional limits for tasks assessed/performed  Bed Mobility  Bed Mobility Sit to Supine  Sit to Supine 4: Min assist  Details for Bed Mobility Assistance verbal cues for technique, assist for R LE onto bed  Transfers  Transfers Sit to Stand;Stand to Sit;Stand Pivot Transfers  Sit to Stand 4: Min guard;With armrests;From chair/3-in-1  Stand to Sit 4: Min guard;With upper extremity assist;To elevated surface;To bed  Stand Pivot Transfers 4: Min guard  Details for Transfer Assistance verbal cues for safe technique including R LE forward  Total Joint Exercises  Ankle Circles/Pumps AROM;Both;20 reps;Supine  Quad Sets Strengthening;Both;20 reps;Supine  Gluteal Sets Strengthening;Both;Other reps (comment);Supine (20 reps)  Short Arc Quad AAROM;Strengthening;Right;Other reps (comment);Supine (15 reps)  Heel Slides AAROM;Strengthening;15 reps;Supine;Other (comment);Right (within precautions)  Hip ABduction/ADduction Strengthening;AAROM;Right;15 reps;Supine  PT - End of Session  Equipment Utilized During Treatment Gait belt  Activity Tolerance Patient limited by pain  Patient left in bed;with call bell/phone within reach;with family/visitor present  PT - Assessment/Plan  Comments on Treatment Session Pt performed exercises and then assisted back to bed.  Reviewed hip precautions.  PT Plan Discharge plan remains appropriate;Frequency remains appropriate  Follow Up  Recommendations Home health PT  Equipment Recommended Rolling walker with 5" wheels  Acute Rehab PT Goals  PT Goal: Sit to Supine/Side - Progress Progressing toward goal  PT Goal: Sit to Stand - Progress Progressing toward goal  PT Goal: Stand to Sit - Progress Progressing toward goal  PT Goal: Perform Home Exercise Program - Progress Progressing toward goal  Additional Goals  PT Goal: Additional Goal #1 - Progress Progressing toward goal    Zenovia Jarred, PT Pager: [phone]

## 2011-10-29 NOTE — Progress Notes (Signed)
Subjective: 1 Day Post-Op Procedure(s) (LRB): TOTAL HIP ARTHROPLASTY (Right) Patient reports pain as mild.   Patient seen in rounds with Dr. Lequita Halt. One of her daughter's is in the room with her this morning. Patient is well, and has had no acute complaints or problems We will start therapy today.  Plan is to go Home after hospital stay.  Objective: Vital signs in last 24 hours: Temp:  [97.7 F (36.5 C)-98.3 F (36.8 C)] 97.8 F (36.6 C) (04/30 0610) Pulse Rate:  [61-78] 64  (04/30 0610) Resp:  [15-18] 16  (04/30 0610) BP: (139-163)/(64-77) 144/75 mmHg (04/30 0610) SpO2:  [93 %-100 %] 99 % (04/30 0610) Weight:  [103.874 kg (229 lb)] 103.874 kg (229 lb) (04/29 1458)  Intake/Output from previous day:  Intake/Output Summary (Last 24 hours) at 10/29/11 0820 Last data filed at 10/29/11 0644  Gross per 24 hour  Intake   4485 ml  Output   1705 ml  Net   2780 ml    Intake/Output this shift:    Labs:  Basename 10/29/11 0420  HGB 11.1*    Basename 10/29/11 0420  WBC 11.1*  RBC 3.68*  HCT 33.9*  PLT 199    Basename 10/29/11 0420  NA 136  K 4.4  CL 103  CO2 26  BUN 16  CREATININE 0.52  GLUCOSE 158*  CALCIUM 9.5   No results found for this basename: LABPT:2,INR:2 in the last 72 hours  EXAM General - Patient is Alert, Appropriate and Oriented Extremity - Neurovascular intact Sensation intact distally Dressing - dressing C/D/I Motor Function - intact, moving foot and toes well on exam.  Hemovac pulled without difficulty.  Past Medical History  Diagnosis Date  . Hypertension   . Hypercholesteremia   . Chronic kidney disease     hx of kidney stones  . Anxiety   . Depression   . Arthritis   . Shortness of breath     denies at preop of 10/22/11/  . GERD (gastroesophageal reflux disease)     Assessment/Plan: 1 Day Post-Op Procedure(s) (LRB): TOTAL HIP ARTHROPLASTY (Right) Principal Problem:  *OA (osteoarthritis) of hip   Advance diet Up with  therapy Continue foley due to strict I&O and urinary output monitoring Discharge home with home health  DVT Prophylaxis - Xarelto Partial-Weight Bearing 25-50% right Leg D/C Knee Immobilizer Hemovac Pulled Begin Therapy Hip Preacutions Keep foley until tomorrow. No vaccines.  Yassine Brunsman 10/29/2011, 8:20 AM

## 2011-10-29 NOTE — Evaluation (Signed)
Physical Therapy Evaluation Patient Details Name: Caitlin Mccarthy MRN: [id number] DOB: 1936/06/09 Today's Date: 10/29/2011 Time: 6578-4696 PT Time Calculation (min): 20 min  PT Assessment / Plan / Recommendation Clinical Impression  Pt s/p R THA.  Pt would benefit from acute PT services in order to improve independence and safety with transfers, mobility, and stairs to prepare for safe d/c home with spouse.  Daughter present on eval and educated on posterior hip precautions and PWB as well.      PT Assessment  Patient needs continued PT services    Follow Up Recommendations  Home health PT    Equipment Recommendations  Rolling walker with 5" wheels    Frequency 7X/week    Precautions / Restrictions Precautions Precautions: Posterior Hip Precaution Comments: Handout posted in room. Restrictions Weight Bearing Restrictions: Yes RLE Weight Bearing: Partial weight bearing RLE Partial Weight Bearing Percentage or Pounds: 25-50%   Pertinent Vitals/Pain       Mobility  Bed Mobility Bed Mobility: Supine to Sit Supine to Sit: 3: Mod assist;With rails;HOB elevated Details for Bed Mobility Assistance: verbal cues for technique, assist for R LE and trunk Transfers Transfers: Sit to Stand;Stand to Sit Sit to Stand: 4: Min assist;With upper extremity assist;From bed Stand to Sit: To chair/3-in-1;4: Min assist;With upper extremity assist Details for Transfer Assistance: verbal cues for safe technique including R LE forward Ambulation/Gait Ambulation/Gait Assistance: 4: Min guard Ambulation Distance (Feet): 140 Feet Assistive device: Rolling walker Ambulation/Gait Assistance Details: verbal cues for sequence, step length, RW distance and safe turning Gait Pattern: Step-to pattern;Decreased stance time - right    Exercises     PT Goals Acute Rehab PT Goals PT Goal Formulation: With patient Time For Goal Achievement: 11/05/11 Potential to Achieve Goals: Good Pt will go  Supine/Side to Sit: with supervision PT Goal: Supine/Side to Sit - Progress: Goal set today Pt will go Sit to Supine/Side: with supervision PT Goal: Sit to Supine/Side - Progress: Goal set today Pt will go Sit to Stand: with supervision PT Goal: Sit to Stand - Progress: Goal set today Pt will go Stand to Sit: with supervision PT Goal: Stand to Sit - Progress: Goal set today Pt will Ambulate: with supervision;>150 feet;with least restrictive assistive device PT Goal: Ambulate - Progress: Goal set today Pt will Go Up / Down Stairs: 1-2 stairs;with supervision;with rolling walker PT Goal: Up/Down Stairs - Progress: Goal set today Pt will Perform Home Exercise Program: with supervision, verbal cues required/provided PT Goal: Perform Home Exercise Program - Progress: Goal set today Additional Goals Additional Goal #1: Pt will verbalize and demonstrate all hip precautions and PWB without cues. 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 as well."   Prior Functioning  Home Living Lives With: Spouse Type of Home: House Home Access: Stairs to enter [employer] of Steps: 1 little step "lip" Entrance Stairs-Rails: None Home Layout: Able to live on main level with bedroom/bathroom Home Adaptive Equipment: Walker - rolling;Bedside commode/3-in-1;Other (comment) (reports RW is "wore out") Prior Function Level of Independence: Independent with assistive device(s) Communication Communication: No difficulties    Cognition  Overall Cognitive Status: Appears within functional limits for tasks assessed/performed Arousal/Alertness: Awake/alert Orientation Level: Appears intact for tasks assessed Behavior During Session: [facility] for tasks performed    Extremity/Trunk Assessment Right Upper Extremity Assessment RUE ROM/Strength/Tone: [facility]  for  tasks assessed Left Upper Extremity Assessment LUE ROM/Strength/Tone: [facility] for tasks assessed Right Lower Extremity Assessment RLE ROM/Strength/Tone: Deficits RLE ROM/Strength/Tone Deficits: decreased hip movement against gravity Left Lower Extremity Assessment LLE ROM/Strength/Tone: WFL for tasks assessed   Balance    End of Session PT - End of Session Equipment Utilized During Treatment: Gait belt Activity Tolerance: Patient tolerated treatment well Patient left: in chair;with call bell/phone within reach;with family/visitor present   Sharlynn Seckinger,KATHrine E 10/29/2011, 11:59 AM Pager: [phone]

## 2011-10-30 ENCOUNTER — Encounter (HOSPITAL_COMMUNITY): Payer: Self-pay | Admitting: Orthopedic Surgery

## 2011-10-30 ENCOUNTER — Encounter: Payer: Medicare HMO | Admitting: Cardiovascular Disease

## 2011-10-30 LAB — CBC
HCT: 33.1 % — ABNORMAL LOW (ref 36.0–46.0)
MCHC: 32.6 g/dL (ref 30.0–36.0)
Platelets: 226 10*3/uL (ref 150–400)
RDW: 12.7 % (ref 11.5–15.5)
WBC: 9.7 10*3/uL (ref 4.0–10.5)

## 2011-10-30 LAB — BASIC METABOLIC PANEL
Chloride: 102 mEq/L (ref 96–112)
GFR calc Af Amer: >90 mL/min (ref >90)
GFR calc non Af Amer: >90 mL/min (ref >90)
Potassium: 4.1 mEq/L (ref 3.5–5.1)
Sodium: 137 mEq/L (ref 135–145)

## 2011-10-30 MED ORDER — OXYCODONE HCL 5 MG PO TABS: 5.0000 mg | ORAL_TABLET | ORAL | Status: DC | PRN

## 2011-10-30 MED ORDER — METHOCARBAMOL 500 MG PO TABS: 500.0000 mg | ORAL_TABLET | Freq: Four times a day (QID) | ORAL | Status: AC | PRN

## 2011-10-30 MED ORDER — RIVAROXABAN 10 MG PO TABS: 10.0000 mg | ORAL_TABLET | Freq: Every day | ORAL | Status: DC

## 2011-10-30 NOTE — Progress Notes (Signed)
Subjective: 2 Days Post-Op Procedure(s) (LRB): TOTAL HIP ARTHROPLASTY (Right) Patient reports pain as mild and moderate.   Patient seen in rounds with Dr. Lequita Halt. Daughter in room with her again this morning. Patient is well, but has had some minor complaints of pain in the hip, requiring pain medications Plan is to go Home after hospital stay.  Objective: Vital signs in last 24 hours: Temp:  [97.8 F (36.6 C)-98.9 F (37.2 C)] 98.1 F (36.7 C) (05/01 0515) Pulse Rate:  [69-86] 86  (05/01 0515) Resp:  [16-18] 16  (05/01 0515) BP: (112-144)/(56-70) 144/70 mmHg (05/01 0515) SpO2:  [94 %-96 %] 96 % (05/01 0515)  Intake/Output from previous day:  Intake/Output Summary (Last 24 hours) at 10/30/11 1034 Last data filed at 10/30/11 0907  Gross per 24 hour  Intake 1790.83 ml  Output   2250 ml  Net -459.17 ml    Intake/Output this shift: Total I/O In: 240 [P.O.:240] Out: -   Labs:  Basename 10/30/11 0423 10/29/11 0420  HGB 10.8* 11.1*    Basename 10/30/11 0423 10/29/11 0420  WBC 9.7 11.1*  RBC 3.59* 3.68*  HCT 33.1* 33.9*  PLT 226 199    Basename 10/30/11 0423 10/29/11 0420  NA 137 136  K 4.1 4.4  CL 102 103  CO2 28 26  BUN 12 16  CREATININE 0.54 0.52  GLUCOSE 123* 158*  CALCIUM 9.3 9.5   No results found for this basename: LABPT:2,INR:2 in the last 72 hours  EXAM General - Patient is Alert, Appropriate and Oriented Extremity - Neurovascular intact Sensation intact distally Dressing/Incision - clean, dry, no drainage, healing Motor Function - intact, moving foot and toes well on exam.   Past Medical History  Diagnosis Date  . Hypertension   . Hypercholesteremia   . Chronic kidney disease     hx of kidney stones  . Anxiety   . Depression   . Arthritis   . Shortness of breath     denies at preop of 10/22/11/  . GERD (gastroesophageal reflux disease)     Assessment/Plan: 2 Days Post-Op Procedure(s) (LRB): TOTAL HIP ARTHROPLASTY (Right) Principal  Problem:  *OA (osteoarthritis) of hip   Up with therapy Plan for discharge tomorrow Discharge home with home health  DVT Prophylaxis - Xarelto Partial-Weight Bearing 25-50% right Leg  Eivin Mascio 10/30/2011, 10:34 AM

## 2011-10-30 NOTE — Evaluation (Signed)
Occupational Therapy Evaluation Patient Details Name: Caitlin Mccarthy MRN: [id number] DOB: May 17, 1936 Today's Date: 10/30/2011 Time: 4098-1191 OT Time Calculation (min): 31 min  OT Assessment / Plan / Recommendation Clinical Impression  This 76 year old female was admitted for R THA.  She has posterior THPs and is PWB.  She will benefit from continued OT to safely perform ADLs/transfers following THPs with supervision level goals in acute    OT Assessment  Patient needs continued OT Services    Follow Up Recommendations  Home health OT    Equipment Recommendations  Rolling walker with 5" wheels    Frequency Min 2X/week    Precautions / Restrictions Precautions Precautions: Posterior Hip Restrictions Weight Bearing Restrictions: Yes RLE Weight Bearing: Partial weight bearing RLE Partial Weight Bearing Percentage or Pounds: 25-50%   Pertinent Vitals/Pain     ADL  Eating/Feeding: Simulated;Independent Where Assessed - Eating/Feeding: Bed level Grooming: Simulated;Set up Where Assessed - Grooming: Supported sitting Upper Body Bathing: Performed;Set up Where Assessed - Upper Body Bathing: Sitting, bed;Unsupported Lower Body Bathing: Performed;Moderate assistance Where Assessed - Lower Body Bathing: Sit to stand from bed Upper Body Dressing: Performed;Set up Where Assessed - Upper Body Dressing: Sitting, bed;Unsupported Lower Body Dressing: Simulated;Performed;Moderate assistance (tried sock aid; will have husband help) Where Assessed - Lower Body Dressing: Sit to stand from chair Toilet Transfer: Simulated;Minimal assistance (bed to chair) Toilet Transfer Method: Stand pivot Toileting - Clothing Manipulation: Simulated;Minimal assistance Where Assessed - Toileting Clothing Manipulation: Standing Toileting - Hygiene: Simulated;Minimal assistance Where Assessed - Toileting Hygiene: Standing Tub/Shower Transfer: Other (comment) (feels her shower is different and wants to  wait for [facility]) Equipment Used: Reacher;Sock aid;Rolling walker (pt used to have a reacher; husband will help) Ambulation Related to ADLs: decreased tolerance secondary to pain:  no toileting needs.  Did not wish to practice with our shower ADL Comments: min vcs for thps during adls and mobility  Daughter present and educated also.  Needs reinforcement    OT Goals Acute Rehab OT Goals OT Goal Formulation: With patient Time For Goal Achievement: 11/06/11 Potential to Achieve Goals: Good ADL Goals Pt Will Transfer to Toilet: with supervision;Ambulation;3-in-1;with cueing (comment type and amount) (no more than 1 vc for thps) ADL Goal: Toilet Transfer - Progress: Goal set today Miscellaneous OT Goals Miscellaneous OT Goal #1: pt will complete all aspects of toileting at modifed independent level OT Goal: Miscellaneous Goal #1 - Progress: Goal set today Miscellaneous OT Goal #2: pt will perform upper body adls, grooming at set up level without cues and sit to stand at modified independent level OT Goal: Miscellaneous Goal #2 - Progress: Goal set today  Visit Information  Last OT Received On: 10/30/11 Assistance Needed: +1    Subjective Data  Subjective: "I wasn't able to bend forward before this" Patient Stated Goal: none stated:  agreeable to OT   Prior Functioning  Home Living Bathroom Shower/Tub: Walk-in Stage manager:  (3:1) Prior Function Level of Independence: Independent with assistive device(s) Driving: Yes Communication Communication: No difficulties    Cognition  Overall Cognitive Status: Appears within functional limits for tasks assessed/performed Arousal/Alertness: Awake/alert Orientation Level: Appears intact for tasks assessed Behavior During Session: [facility] for tasks performed Cognition - Other Comments: needs reinforcement with thps:  pt verbalized 2/3    Extremity/Trunk Assessment Right Upper Extremity Assessment RUE ROM/Strength/Tone: Deficits (mild  decrease in external rotation) Left Upper Extremity Assessment LUE ROM/Strength/Tone: WFL for tasks assessed   Mobility Bed Mobility Supine to Sit: 3:  Mod assist;With rails Details for Bed Mobility Assistance: min vcs Transfers Sit to Stand: 4: Min assist;From elevated surface;From bed;With upper extremity assist Details for Transfer Assistance: mn vcs for hand/leg placement   Exercise    Balance    End of Session OT - End of Session Activity Tolerance: Patient tolerated treatment well Patient left: in chair;with call bell/phone within reach;with family/visitor present  Marica Otter, OTR/L [phone] 10/30/2011 Caitlin Mccarthy 10/30/2011, 10:15 AM

## 2011-10-30 NOTE — Discharge Instructions (Signed)
Hip Rehabilitation, Guidelines Following Surgery The results of a hip operation are greatly improved after range of motion and muscle strengthening exercises. Follow all safety measures which are given to protect your hip. If any of these exercises cause increased pain or swelling in your joint, decrease the amount until you are comfortable again. Then slowly increase the exercises. Call your caregiver if you have problems or questions. HOME CARE INSTRUCTIONS  Most of the following instructions are designed to prevent the dislocation of your new hip.  Do not put on socks or shoes without following the instructions of your caregivers.   Sit on high chairs so your hips are not bent more than 90 degrees.   Sit on chairs with arms. Use the chair arms to help push yourself up when arising.   Keep your leg on the side of the operation out in front of you when standing up.   Arrange for the use of a toilet seat elevator so you are not sitting low.   Do not do any exercises or get in any positions that cause your toes to point in (pigeon toed).   Always sleep with a pillow between your legs. Do not lie on your side in sleep with both knees touching the bed.   You may resume a sexual relationship in one month or when given the OK by your caregiver.   Use crutches or walker as long as suggested by your caregivers.   Begin weight bearing with your caregiver's approval.   Avoid periods of inactivity such as sitting longer than an hour when not asleep. This helps prevent blood clots.   Return to work as instructed by your caregiver.   Do not drive a car for 6 weeks or as instructed.   Do not drive while taking narcotics.   Wear elastic stockings until instructed not to.   Make sure you keep all of your appointments after your operation with all of your doctors and caregivers.  RANGE OF MOTION AND STRENGTHENING EXERCISES These exercises are designed to help you keep full movement of your hip  joint. Follow your caregiver's or physical therapist's instructions. Perform all exercises about fifteen times, three times per day or as directed. Exercise both hips, even if you have had only one joint replacement. These exercises can be done on a training (exercise) mat, on the floor, on a table or on a bed. Use whatever works the best and is most comfortable for you. Use music or television while you are exercising so that the exercises are a pleasant break in your day. This will make your life better with the exercises acting as a break in routine you can look forward to.  Lying on your back, slowly slide your foot toward your buttocks, raising your knee up off the floor. Then slowly slide your foot back down until your leg is straight again.   Lying on your back spread your legs as far apart as you can without causing discomfort.   Lying on your side, raise your upper leg and foot straight up from the floor as far as is comfortable. Slowly lower the leg and repeat.   Lying on your back, tighten up the muscle in the front of your thigh (quadriceps muscles). You can do this by keeping your leg straight and trying to raise your heel off the floor. This helps strengthen the largest muscle supporting your knee.   Lying on your back, tighten up the muscles of your buttocks both   with the legs straight and with the knee bent at a comfortable angle while keeping your heel on the floor. Document Released: 01/19/2004 Document Revised: 06/06/2011 Document Reviewed: 01/06/2008 Exit[employer] Patient Information 2012 [employer].  Pick up stool softner and laxative for home. Do not submerge incision under water. May shower. Continue to use ice for pain and swelling from surgery. Hip precautions.  Total Hip Protocol.  Take Xarelto for two and a half more weeks, then discontinue Xarelto. 

## 2011-10-30 NOTE — Progress Notes (Signed)
CARE MANAGEMENT NOTE 10/30/2011  Patient:  Caitlin Mccarthy, Caitlin Mccarthy   Account Number:  [id number]  Date Initiated:  10/30/2011  Documentation initiated by:  Colleen Can  Subjective/Objective Assessment:   dx osteoarthritis right hip; anterior hip replacemnt     Action/Plan:   CM spoke with patient spouse and daughter. Plans are for patient to return to her home in [place] where spouse will be primary caregiver. States she already has IT trainer. She will need RW   Anticipated DC Date:  10/31/2011   Anticipated DC Plan:  HOME W HOME HEALTH SERVICES  In-house referral  Clinical Social Worker      DC Planning Services  CM consult      [facility] Choice  HOME HEALTH  DURABLE MEDICAL EQUIPMENT   Choice offered to / List presented to:  C-1 Patient   DME arranged  WALKER - ROLLING      DME agency  [facility]     HH arranged  HH-2 PT      [facility] agency  [facility]   Status of service:  In process, will continue to follow Medicare Important Message given?  NA - LOS <3 / Initial given by admissions (If response is "NO", the following Medicare IM given date fields will be blank)     Comments:  10/30/2011 Raynelle Bring BSN CCM [phone] lIST OF hh AGENCIES PLACED ON SHADOW CHART. PT CHOSE [facility] [place] [facility]. FACE SHEET, hhPT ORDER, OP NOTE, H&p SENT TO [facility] VIA [phone] WITH CONFIRMATION RECEIVED. dme ORDER FOR RW SENT VIA FAX TO [facility] [id number] WITH CONFIRMATION

## 2011-10-30 NOTE — Progress Notes (Signed)
Physical Therapy Treatment Note   10/30/11 1400  PT Visit Information  Last PT Received On 10/30/11  Assistance Needed +1  PT Time Calculation  PT Start Time 1321  PT Stop Time 1340  PT Time Calculation (min) 19 min  Subjective Data  Subjective "are you counting?" (exercises)  Precautions  Precautions Posterior Hip  Restrictions  Weight Bearing Restrictions Yes  RLE Weight Bearing PWB  RLE Partial Weight Bearing Percentage or Pounds 25-50%  Cognition  Overall Cognitive Status Appears within functional limits for tasks assessed/performed  Total Joint Exercises  Ankle Circles/Pumps AROM;Both;20 reps;Supine  Quad Sets Strengthening;Both;20 reps;Supine  Gluteal Sets Strengthening;Both;Other reps (comment);Supine (20 reps)  Short Arc Quad AAROM;Strengthening;Right;Other reps (comment);Supine (20 reps)  Heel Slides AAROM;Strengthening;Supine;Other (comment);Right;20 reps (within precautions)  Hip ABduction/ADduction Strengthening;Right;Supine;AAROM;20 reps  PT - End of Session  Activity Tolerance Patient limited by pain  Patient left in bed;with call bell/phone within reach;with family/visitor present  PT - Assessment/Plan  Comments on Treatment Session Pt performed exercises in supine.  PT Plan Discharge plan remains appropriate;Frequency remains appropriate  Follow Up Recommendations Home health PT  Equipment Recommended Rolling walker with 5" wheels  Acute Rehab PT Goals  PT Goal: Perform Home Exercise Program - Progress Progressing toward goal    Zenovia Jarred, PT Pager: [phone]

## 2011-10-30 NOTE — Progress Notes (Signed)
Physical Therapy Treatment Patient Details Name: Caitlin Mccarthy MRN: [id number] DOB: 1935/12/29 Today's Date: 10/30/2011 Time: 4098-1191 PT Time Calculation (min): 16 min  PT Assessment / Plan / Recommendation Comments on Treatment Session  Pt ambulated and performed one step this morning.  Also reviewed hip precautions and pt able to recall all 3 with increased time.    Follow Up Recommendations  Home health PT    Equipment Recommendations  Rolling walker with 5" wheels    Frequency     Plan Discharge plan remains appropriate;Frequency remains appropriate    Precautions / Restrictions Precautions Precautions: Posterior Hip Restrictions Weight Bearing Restrictions: Yes RLE Weight Bearing: Partial weight bearing RLE Partial Weight Bearing Percentage or Pounds: 25-50%   Pertinent Vitals/Pain     Mobility   Transfers Transfers: Sit to Stand;Stand to Sit;Stand Pivot Transfers Sit to Stand: 4: Min guard;With armrests;From chair/3-in-1 Stand to Sit: 4: Min guard;With upper extremity assist;To elevated surface;To bed Details for Transfer Assistance: verbal cues for safe technique including R LE forward Ambulation/Gait Ambulation/Gait Assistance: 4: Min guard Ambulation Distance (Feet): 80 Feet Assistive device: Rolling walker Ambulation/Gait Assistance Details: verbal cues for sequence initially, able to recall turn towards left side Gait Pattern: Step-to pattern;Decreased stance time - right Stairs: Yes Stairs Assistance: 4: Min assist Stairs Assistance Details (indicate cue type and reason): verbal cues for sequence and technique, spouse present and held RW Stair Management Technique: Step to pattern;Forwards;With walker Number of Stairs: 1     Exercises     PT Goals Acute Rehab PT Goals PT Goal: Sit to Stand - Progress: Progressing toward goal PT Goal: Stand to Sit - Progress: Progressing toward goal PT Goal: Ambulate - Progress: Progressing toward goal PT Goal:  Up/Down Stairs - Progress: Progressing toward goal Additional Goals PT Goal: Additional Goal #1 - Progress: Partly met  Visit Information  Last PT Received On: 10/30/11 Assistance Needed: +1    Subjective Data  Subjective: "I can't remember them." (hip precautions but able to name all 3 with time)   Cognition  Overall Cognitive Status: Appears within functional limits for tasks assessed/performed Arousal/Alertness: Awake/alert Orientation Level: Appears intact for tasks assessed Behavior During Session: [facility] for tasks performed Cognition - Other Comments: needs reinforcement with thps:  pt verbalized 2/3    Balance     End of Session PT - End of Session Equipment Utilized During Treatment: Gait belt Activity Tolerance: Patient limited by pain Patient left: in bed;with call bell/phone within reach;with family/visitor present    Siren Porrata,KATHrine E 10/30/2011, 11:27 AM Pager: [phone]

## 2011-10-31 LAB — CBC
HCT: 33.3 % — ABNORMAL LOW (ref 36.0–46.0)
Hemoglobin: 11.1 g/dL — ABNORMAL LOW (ref 12.0–15.0)
MCHC: 33.3 g/dL (ref 30.0–36.0)
RBC: 3.68 MIL/uL — ABNORMAL LOW (ref 3.87–5.11)
WBC: 10.6 10*3/uL — ABNORMAL HIGH (ref 4.0–10.5)

## 2011-10-31 MED ORDER — HYDROCODONE-ACETAMINOPHEN 5-325 MG PO TABS
1.0000 | ORAL_TABLET | ORAL | Status: DC | PRN
Start: 2011-10-31 — End: 2011-10-31
  Administered 2011-10-31: 1 via ORAL
  Filled 2011-10-31: qty 1

## 2011-10-31 MED ORDER — HYDROCODONE-ACETAMINOPHEN 5-325 MG PO TABS: 1.0000 | ORAL_TABLET | ORAL | Status: AC | PRN

## 2011-10-31 MED ORDER — BISACODYL 10 MG RE SUPP: 10.0000 mg | Freq: Once | RECTAL | Status: DC

## 2011-10-31 NOTE — Progress Notes (Signed)
Comments:  10/31/2011 Raynelle Bring BSN CCM [phone] CM called [facility] regarding DME delivery: Spoke with Crystal who advised that she had spoke with patient via phone and she stated that someone would came by to pick up RW today at DME office in [facility]. Cm spoke with patient and pt states she remembers talking with some one from [place] but does not remember the conversation. States she will need RW delivered to hospital. CM called [employer]  back and advised that patient would need RW delivered to hospital. She was unable to advise estimated time of delivery because delivery trucks were already on route and this would be an add-on. Requested call back if she was able to get info.

## 2011-10-31 NOTE — Progress Notes (Signed)
D/c instructions given and explained to pt. Prescription for xarelto, robaxin, and norco given to pt.  Pt gave to daughter.  Iv site was removed yesterday.  Pt stable and ready for d/c.  Pt had small bm today.

## 2011-10-31 NOTE — Progress Notes (Signed)
Subjective: 3 Days Post-Op Procedure(s) (LRB): TOTAL HIP ARTHROPLASTY (Right) Patient reports pain as mild and some nausea.  No BM yet.  Will change meds and work on bowels.  If does well and improves, will allow her home later today. Patient is well, but has had some minor complaints of constipation and and nausea. Will work on bowels. Daughter in room.  Objective: Vital signs in last 24 hours: Temp:  [98.1 F (36.7 C)-98.8 F (37.1 C)] 98.1 F (36.7 C) (05/02 0553) Pulse Rate:  [82-95] 84  (05/02 0553) Resp:  [16-20] 16  (05/02 0553) BP: (130-173)/(61-89) 173/89 mmHg (05/02 0553) SpO2:  [90 %-96 %] 96 % (05/02 0553)  Intake/Output from previous day:  Intake/Output Summary (Last 24 hours) at 10/31/11 0808 Last data filed at 10/30/11 1700  Gross per 24 hour  Intake    720 ml  Output   1000 ml  Net   -280 ml    Intake/Output this shift:    Labs:  Basename 10/31/11 0413 10/30/11 0423 10/29/11 0420  HGB 11.1* 10.8* 11.1*    Basename 10/31/11 0413 10/30/11 0423  WBC 10.6* 9.7  RBC 3.68* 3.59*  HCT 33.3* 33.1*  PLT 254 226    Basename 10/30/11 0423 10/29/11 0420  NA 137 136  K 4.1 4.4  CL 102 103  CO2 28 26  BUN 12 16  CREATININE 0.54 0.52  GLUCOSE 123* 158*  CALCIUM 9.3 9.5   No results found for this basename: LABPT:2,INR:2 in the last 72 hours  EXAM: General - Patient is Alert, Appropriate and Oriented Extremity - Neurovascular intact Sensation intact distally Incision - clean, dry, no drainage, healing Motor Function - intact, moving foot and toes well on exam.   Assessment/Plan: 3 Days Post-Op Procedure(s) (LRB): TOTAL HIP ARTHROPLASTY (Right) Procedure(s) (LRB): TOTAL HIP ARTHROPLASTY (Right) Past Medical History  Diagnosis Date  . Hypertension   . Hypercholesteremia   . Chronic kidney disease     hx of kidney stones  . Anxiety   . Depression   . Arthritis   . Shortness of breath     denies at preop of 10/22/11/  . GERD (gastroesophageal  reflux disease)    Principal Problem:  *OA (osteoarthritis) of hip   Discharge home with home health Diet - Regular diet Follow up - in 2 weeks Activity - PWB Disposition - Home Condition Upon Discharge - Stable at rounds. Home if improved. D/C Meds - See DC Summary DVT Prophylaxis - Xarelto  Codi Folkerts 10/31/2011, 8:08 AM

## 2011-10-31 NOTE — Progress Notes (Signed)
Physical Therapy Treatment Patient Details Name: Caitlin Mccarthy MRN: [id number] DOB: 1936/02/10 Today's Date: 10/31/2011 Time: 0850-0905 PT Time Calculation (min): 15 min  PT Assessment / Plan / Recommendation Comments on Treatment Session  Pt doing well with ambulation.  Daughters present for tx.  Reviewed safe stair technique and all hip precautions as well as car transfer.  Pt and daughters had no questions/concerns.  Pt to d/c home later today.    Follow Up Recommendations  Home health PT    Equipment Recommendations  Rolling walker with 5" wheels    Frequency     Plan Discharge plan remains appropriate;Frequency remains appropriate    Precautions / Restrictions Precautions Precautions: Posterior Hip Restrictions Weight Bearing Restrictions: Yes RLE Weight Bearing: Partial weight bearing RLE Partial Weight Bearing Percentage or Pounds: 25-50%   Pertinent Vitals/Pain     Mobility  Bed Mobility Bed Mobility: Supine to Sit Supine to Sit: 4: Min assist Details for Bed Mobility Assistance: slight assist for R LE Transfers Transfers: Sit to Stand;Stand to Sit Sit to Stand: 5: Supervision;With upper extremity assist;From bed Stand to Sit: 5: Supervision;With armrests;To chair/3-in-1 Details for Transfer Assistance: verbal cues for R LE forward and hand placement Ambulation/Gait Ambulation/Gait Assistance: 5: Supervision Ambulation Distance (Feet): 120 Feet Assistive device: Rolling walker Ambulation/Gait Assistance Details: pt did well with ambulation Gait Pattern: Step-to pattern;Decreased stance time - right    Exercises     PT Goals Acute Rehab PT Goals PT Goal: Supine/Side to Sit - Progress: Progressing toward goal PT Goal: Sit to Stand - Progress: Met PT Goal: Stand to Sit - Progress: Met PT Goal: Ambulate - Progress: Progressing toward goal Additional Goals PT Goal: Additional Goal #1 - Progress: Met  Visit Information  Last PT Received On:  10/31/11 Assistance Needed: +1    Subjective Data  Subjective: "I just got back from the bathroom."   Cognition  Overall Cognitive Status: Appears within functional limits for tasks assessed/performed    Balance     End of Session PT - End of Session Activity Tolerance: Patient limited by pain Patient left: in chair;with call bell/phone within reach;with family/visitor present Nurse Communication: Patient requests pain meds    Tanisia Yokley,KATHrine E 10/31/2011, 10:55 AM Pager: [phone]

## 2011-11-01 NOTE — Progress Notes (Signed)
05/03/2013[facility] notified of discharge. Start of services today 11/01/2011.

## 2011-11-13 NOTE — Discharge Summary (Signed)
Physician Discharge Summary   Patient ID: Caitlin Mccarthy MRN: [id number] DOB/AGE: 1935-12-08 76 y.o.  Admit date: 10/28/2011 Discharge date: 10/31/2011  Primary Diagnosis: Osteoarthritis Right Hip  Admission Diagnoses:  Past Medical History  Diagnosis Date  . Hypertension   . Hypercholesteremia   . Chronic kidney disease     hx of kidney stones  . Anxiety   . Depression   . Arthritis   . Shortness of breath     denies at preop of 10/22/11/  . GERD (gastroesophageal reflux disease)    Discharge Diagnoses:   Principal Problem:  *OA (osteoarthritis) of hip  Procedure: Procedure(s) (LRB): TOTAL HIP ARTHROPLASTY (Right)   Consults: None  HPI: Caitlin Mccarthy is a 76 y.o. female with end stage arthritis of her right hip with progressively worsening pain and dysfunction. Pain occurs with activity and rest including pain at night. She has tried analgesics, protected weight bearing and rest without benefit. Pain is too severe to attempt physical therapy. Radiographs demonstrate bone on bone arthritis with subchondral cyst formation. She presents now for right THA.  Laboratory Data: Hospital Outpatient Visit on 10/22/2011  Component Date Value Range Status  . aPTT (seconds) 10/22/2011 32  24-37 Final  . WBC (K/uL) 10/22/2011 7.7  4.0-10.5 Final  . RBC (MIL/uL) 10/22/2011 5.03  3.87-5.11 Final  . Hemoglobin (g/dL) 52/84/1324 40.1* 02.7-25.3 Final  . HCT (%) 10/22/2011 46.3* 36.0-46.0 Final  . MCV (fL) 10/22/2011 92.0  78.0-100.0 Final  . MCH (pg) 10/22/2011 31.2  26.0-34.0 Final  . MCHC (g/dL) 66/44/0347 42.5  95.6-38.7 Final  . RDW (%) 10/22/2011 12.6  11.5-15.5 Final  . Platelets (K/uL) 10/22/2011 225  150-400 Final  . Sodium (mEq/L) 10/22/2011 138  135-145 Final  . Potassium (mEq/L) 10/22/2011 3.8  3.5-5.1 Final  . Chloride (mEq/L) 10/22/2011 99  96-112 Final  . CO2 (mEq/L) 10/22/2011 27  19-32 Final  . Glucose, Bld (mg/dL) 56/43/3295 188* 41-66 Final  . BUN (mg/dL)  12/29/1599 19  0-93 Final  . Creatinine, Ser (mg/dL) 23/55/7322 0.25  4.27-0.62 Final  . Calcium (mg/dL) 37/62/8315 17.6* 1.6-07.3 Final  . Total Protein (g/dL) 71/11/2692 8.9* 8.5-4.6 Final  . Albumin (g/dL) 27/08/5007 4.4  3.8-1.8 Final  . AST (U/L) 10/22/2011 28  0-37 Final  . ALT (U/L) 10/22/2011 23  0-35 Final  . Alkaline Phosphatase (U/L) 10/22/2011 80  39-117 Final  . Total Bilirubin (mg/dL) 29/93/7169 0.4  6.7-8.9 Final  . GFR calc non Af Amer (mL/min) 10/22/2011 87* >90 Final  . GFR calc Af Amer (mL/min) 10/22/2011 >90  >90 Final   Comment:                                 The eGFR has been calculated                          using the CKD EPI equation.                          This calculation has not been                          validated in all clinical                          situations.  eGFR's persistently                          <90 mL/min signify                          possible Chronic Kidney Disease.  [name] Prothrombin Time (seconds) 10/22/2011 12.9  11.6-15.2 Final  . INR  10/22/2011 0.95  0.00-1.49 Final  . Color, Urine  10/22/2011 YELLOW  YELLOW Final  . APPearance  10/22/2011 TURBID* CLEAR Final  . Specific Gravity, Urine  10/22/2011 1.016  1.005-1.030 Final  . pH  10/22/2011 6.5  5.0-8.0 Final  . Glucose, UA (mg/dL) 16/04/9603 NEGATIVE  NEGATIVE Final  . Hgb urine dipstick  10/22/2011 NEGATIVE  NEGATIVE Final  . Bilirubin Urine  10/22/2011 NEGATIVE  NEGATIVE Final  . Ketones, ur (mg/dL) 54/03/8118 NEGATIVE  NEGATIVE Final  . Protein, ur (mg/dL) 14/78/2956 NEGATIVE  NEGATIVE Final  . Urobilinogen, UA (mg/dL) 21/30/8657 0.2  8.4-6.9 Final  . Nitrite  10/22/2011 NEGATIVE  NEGATIVE Final  . Leukocytes, UA  10/22/2011 MODERATE* NEGATIVE Final  . MRSA, PCR  10/22/2011 NEGATIVE  NEGATIVE Final  . Staphylococcus aureus  10/22/2011 NEGATIVE  NEGATIVE Final   Comment:                                 The Xpert SA Assay (FDA                           approved for NASAL specimens                          only), is one component of                          a comprehensive surveillance                          program.  It is not intended                          to diagnose infection nor to                          guide or monitor treatment.  . Squamous Epithelial / LPF  10/22/2011 FEW* RARE Final  . WBC, UA (WBC/hpf) 10/22/2011 11-20  <3 Final  . Bacteria, UA  10/22/2011 FEW* RARE Final  . Daryll Drown  10/22/2011 MUCOUS PRESENT   Final   No results found for this basename: HGB:5 in the last 72 hours No results found for this basename: WBC:2,RBC:2,HCT:2,PLT:2 in the last 72 hours No results found for this basename: NA:2,K:2,CL:2,CO2:2,BUN:2,CREATININE:2,GLUCOSE:2,CALCIUM:2 in the last 72 hours No results found for this basename: LABPT:2,INR:2 in the last 72 hours  X-Rays:Dg Chest 2 View  10/22/2011  *RADIOLOGY REPORT*  Clinical Data: Preop right total hip  CHEST - 2 VIEW  Comparison: 08/08/2004  Findings: Lungs are clear. No pleural effusion or pneumothorax.  Cardiomediastinal silhouette is within normal limits.  Mild degenerative changes of the visualized thoracolumbar spine.  IMPRESSION: No evidence of acute cardiopulmonary disease.  Original Report Authenticated By: Charline Bills, M.D.   Dg Hip Complete Right  10/22/2011  *RADIOLOGY  REPORT*  Clinical Data: Preop right total hip  RIGHT HIP - COMPLETE 2+ VIEW  Comparison: None.  Findings: Severe degenerative changes of the right hip with collapse of the femoral head and mild proximal migration.  Left hip joint space is preserved.  Mild degenerative changes of the lower lumbar spine.  IMPRESSION: Severe degenerative changes of the right hip, as described.  Original Report Authenticated By: Charline Bills, M.D.   Dg Pelvis Portable  10/28/2011  *RADIOLOGY REPORT*  Clinical Data: Right-sided hip replacement.  PORTABLE PELVIS  Comparison: 10/22/2011  Findings: Right total hip  prosthesis is in place, without dislocation, new fracture, or other complicating feature observed.Part of the greater trochanter and the distal stem are excluded on this pelvis radiograph but are included on accession [id number].  IMPRESSION:  1.  Right total hip prosthesis is in place, without complicating feature involving the visualized portion noted.  Original Report Authenticated By: Dellia Cloud, M.D.   Dg Hip Portable 1 View Right  10/28/2011  *RADIOLOGY REPORT*  Clinical Data: Right total hip prosthesis placement.  PORTABLE RIGHT HIP - 1 VIEW  Comparison: 10/22/2011  Findings: The right total hip prosthesis is in place, without fracture or complicating feature observed.  A drain is present along with an expected amount of subcutaneous gas.  IMPRESSION:  1.  Frontal projection includes the complete right total hip prosthesis, without fracture or complicating feature observed.  Original Report Authenticated By: Dellia Cloud, M.D.    EKG: Orders placed during the hospital encounter of 10/28/11  . EKG     Hospital Course: Patient was admitted to [facility] and taken to the OR and underwent the above state procedure without complications.  Patient tolerated the procedure well and was later transferred to the recovery room and then to the orthopaedic floor for postoperative care.  They were given PO and IV analgesics for pain control following their surgery.  They were given 24 hours of postoperative antibiotics and started on DVT prophylaxis in the form of Xarelto.   PT and OT were ordered for total hip protocol.  The patient was allowed to be PWB with therapy. Discharge planning was consulted to help with postop disposition and equipment needs.  Patient had a good night on the evening of surgery and started to get up OOB with therapy on day one.  Hemovac drain was pulled without difficulty.  The knee immobilizer was removed and discontinued.  Continued to work with therapy into  day two and was walking over 80 feet.  Dressing was changed on day two and the incision was healing well.  By day three, the patient had progressed with therapy and meeting their goals.  Incision was healing well.  Patient was seen in rounds and was ready to go home.  Discharge Medications: Prior to Admission medications   Medication Sig Start Date End Date Taking? Authorizing Provider  amLODipine-benazepril (LOTREL) 10-40 MG per capsule Take 1 capsule by mouth daily before breakfast.   Yes Historical Provider, MD  celecoxib (CELEBREX) 200 MG capsule Take 200 mg by mouth daily.    Yes Historical Provider, MD  diphenhydrAMINE (BENADRYL) 25 mg capsule Take 25 mg by mouth daily as needed. Allergies    Yes Historical Provider, MD  hydrochlorothiazide (HYDRODIURIL) 25 MG tablet Take 25 mg by mouth daily before breakfast.   Yes Historical Provider, MD  sertraline (ZOLOFT) 50 MG tablet Take 50 mg by mouth daily before breakfast.   Yes Historical Provider, MD  traMADol (  ULTRAM) 50 MG tablet Take 50 mg by mouth every 6 (six) hours as needed. Pain    Yes Historical Provider, MD  rivaroxaban (XARELTO) 10 MG TABS tablet Take 1 tablet (10 mg total) by mouth daily with breakfast. Take Xarelto for two and a half more weeks, then discontinue Xarelto.  After completing the Xarelto, the patient may resume their 81 mg Aspirin daily. 10/30/11   Ranay Ketter, PA  rosuvastatin (CRESTOR) 20 MG tablet Take 20 mg by mouth at bedtime.     Historical Provider, MD    Diet: Low sodium Activity:PWB No bending hip over 90 degrees- A "L" Angle Do not cross legs Do not let foot roll inward When turning these patients a pillow should be placed between the patient's legs to prevent crossing. Patients should have the affected knee fully extended when trying to sit or stand from all surfaces to prevent excessive hip flexion. When ambulating and turning toward the affected side the affected leg should have the toes turned  out prior to moving the walker and the rest of patient's body as to prevent internal rotation/ turning in of the leg. Abduction pillows are the most effective way to prevent a patient from not crossing legs or turning toes in at rest. If an abduction pillow is not ordered placing a regular pillow length wise between the patient's legs is also an effective reminder. It is imperative that these precautions be maintained so that the surgical hip does not dislocate. Follow-up:in 2 weeks Disposition - Home Discharged Condition: good   Discharge Orders    Future Orders Please Complete By Expires   Diet general      Diet - low sodium heart healthy      Diet general      Call MD / Call 911      Comments:   If you experience chest pain or shortness of breath, CALL 911 and be transported to the hospital emergency room.  If you develope a fever above 101 F, pus (white drainage) or increased drainage or redness at the wound, or calf pain, call your surgeon's office.   Discharge instructions      Comments:   Take Xarelto for two and a half more weeks, then discontinue Xarelto.  Pick up stool softner and laxative for home. Do not submerge incision under water. May shower. Continue to use ice for pain and swelling from surgery. Hip precautions.  Total Hip Protocol.   Constipation Prevention      Comments:   Drink plenty of fluids.  Prune juice may be helpful.  You may use a stool softener, such as Colace (over the counter) 100 mg twice a day.  Use MiraLax (over the counter) for constipation as needed.   Increase activity slowly as tolerated      Weight Bearing as taught in Physical Therapy      Comments:   Use a walker or crutches as instructed.   Patient may shower      Comments:   You may shower without a dressing once there is no drainage.  Do not wash over the wound.  If drainage remains, do not shower until drainage stops.   Driving restrictions      Comments:   No driving until released  by the physician.   Lifting restrictions      Comments:   No lifting until released by the physician.   Do not sit on low chairs, stoools or toilet seats, as it may be  difficult to get up from low surfaces      Follow the hip precautions as taught in Physical Therapy      Change dressing      Comments:   You may change your dressing dressing daily with sterile 4 x 4 inch gauze dressing and paper tape.  Do not submerge the incision under water.   TED hose      Comments:   Use stockings (TED hose) for 3 weeks on both leg(s).  You may remove them at night for sleeping.   Call MD / Call 911      Comments:   If you experience chest pain or shortness of breath, CALL 911 and be transported to the hospital emergency room.  If you develope a fever above 101 F, pus (white drainage) or increased drainage or redness at the wound, or calf pain, call your surgeon's office.   Discharge instructions      Comments:   Pick up stool softner and laxative for home. Do not submerge incision under water. May shower. Continue to use ice for pain and swelling from surgery. Hip precautions.  Total Hip Protocol.  Take Xarelto for two and a half more weeks, then discontinue Xarelto.   Constipation Prevention      Comments:   Drink plenty of fluids.  Prune juice may be helpful.  You may use a stool softener, such as Colace (over the counter) 100 mg twice a day.  Use MiraLax (over the counter) for constipation as needed.   Increase activity slowly as tolerated      Weight Bearing as taught in Physical Therapy      Comments:   Use a walker or crutches as instructed.   Patient may shower      Comments:   You may shower without a dressing once there is no drainage.  Do not wash over the wound.  If drainage remains, do not shower until drainage stops.   Driving restrictions      Comments:   No driving until released by the physician.   Lifting restrictions      Comments:   No lifting until released by the  physician.   Follow the hip precautions as taught in Physical Therapy      Change dressing      Comments:   You may change your dressing dressing daily with sterile 4 x 4 inch gauze dressing and paper tape.  Do not submerge the incision under water.   TED hose      Comments:   Use stockings (TED hose) for 3 weeks on both leg(s).  You may remove them at night for sleeping.   Do not sit on low chairs, stoools or toilet seats, as it may be difficult to get up from low surfaces        Medication List  As of 11/13/2011  9:10 PM   STOP taking these medications         aspirin EC 81 MG tablet      cholecalciferol 400 UNITS Tabs         TAKE these medications         amLODipine-benazepril 10-40 MG per capsule   Commonly known as: LOTREL   Take 1 capsule by mouth daily before breakfast.      celecoxib 200 MG capsule   Commonly known as: CELEBREX   Take 200 mg by mouth daily.      diphenhydrAMINE 25 mg capsule   Commonly  known as: BENADRYL   Take 25 mg by mouth daily as needed. Allergies        hydrochlorothiazide 25 MG tablet   Commonly known as: HYDRODIURIL   Take 25 mg by mouth daily before breakfast.      rivaroxaban 10 MG Tabs tablet   Commonly known as: XARELTO   Take 1 tablet (10 mg total) by mouth daily with breakfast. Take Xarelto for two and a half more weeks, then discontinue Xarelto.  After completing the Xarelto, the patient may resume their 81 mg Aspirin daily.      rosuvastatin 20 MG tablet   Commonly known as: CRESTOR   Take 20 mg by mouth at bedtime.      sertraline 50 MG tablet   Commonly known as: ZOLOFT   Take 50 mg by mouth daily before breakfast.      traMADol 50 MG tablet   Commonly known as: ULTRAM   Take 50 mg by mouth every 6 (six) hours as needed. Pain             Follow-up Information    Follow up with Loanne Drilling, MD. Schedule an appointment as soon as possible for a visit in 2 weeks.   Contact information:   [facility] [address], [address] [place] [address] [phone]          Signed: Patrica Duel 11/13/2011, 9:10 PM

## 2011-11-26 ENCOUNTER — Ambulatory Visit: Payer: Medicare HMO | Attending: Orthopedic Surgery | Admitting: Physical Therapy

## 2011-11-26 DIAGNOSIS — R5381 Other malaise: Secondary | ICD-10-CM | POA: Insufficient documentation

## 2011-11-26 DIAGNOSIS — IMO0001 Reserved for inherently not codable concepts without codable children: Secondary | ICD-10-CM | POA: Insufficient documentation

## 2011-11-26 DIAGNOSIS — R262 Difficulty in walking, not elsewhere classified: Secondary | ICD-10-CM | POA: Insufficient documentation

## 2011-11-26 DIAGNOSIS — M25559 Pain in unspecified hip: Secondary | ICD-10-CM | POA: Insufficient documentation

## 2011-11-28 ENCOUNTER — Ambulatory Visit: Payer: Medicare HMO | Admitting: Physical Therapy

## 2011-12-03 ENCOUNTER — Ambulatory Visit: Payer: Medicare HMO | Attending: Orthopedic Surgery | Admitting: Physical Therapy

## 2011-12-03 DIAGNOSIS — IMO0001 Reserved for inherently not codable concepts without codable children: Secondary | ICD-10-CM | POA: Insufficient documentation

## 2011-12-03 DIAGNOSIS — M25559 Pain in unspecified hip: Secondary | ICD-10-CM | POA: Insufficient documentation

## 2011-12-03 DIAGNOSIS — R5381 Other malaise: Secondary | ICD-10-CM | POA: Insufficient documentation

## 2011-12-03 DIAGNOSIS — R262 Difficulty in walking, not elsewhere classified: Secondary | ICD-10-CM | POA: Insufficient documentation

## 2011-12-05 ENCOUNTER — Ambulatory Visit: Payer: Medicare HMO | Admitting: Physical Therapy

## 2011-12-10 ENCOUNTER — Ambulatory Visit: Payer: Medicare HMO | Admitting: Physical Therapy

## 2011-12-12 ENCOUNTER — Encounter: Payer: Medicare HMO | Admitting: Physical Therapy

## 2011-12-17 ENCOUNTER — Ambulatory Visit: Payer: Medicare HMO | Admitting: Physical Therapy

## 2011-12-19 ENCOUNTER — Encounter: Payer: Medicare HMO | Admitting: Physical Therapy

## 2012-09-18 ENCOUNTER — Other Ambulatory Visit: Payer: Self-pay | Admitting: *Deleted

## 2012-09-18 MED ORDER — ROSUVASTATIN CALCIUM 20 MG PO TABS: 20.0000 mg | ORAL_TABLET | Freq: Every day | ORAL | Status: DC

## 2012-09-18 MED ORDER — TRAMADOL HCL 50 MG PO TABS: 50.0000 mg | ORAL_TABLET | Freq: Three times a day (TID) | ORAL | Status: DC | PRN

## 2012-10-02 ENCOUNTER — Ambulatory Visit (INDEPENDENT_AMBULATORY_CARE_PROVIDER_SITE_OTHER): Payer: Medicare HMO | Admitting: Nurse Practitioner

## 2012-10-02 ENCOUNTER — Encounter: Payer: Self-pay | Admitting: Nurse Practitioner

## 2012-10-02 VITALS — BP 134/77 | HR 77 | Temp 96.3°F | Ht 66.0 in | Wt 233.0 lb

## 2012-10-02 DIAGNOSIS — I10 Essential (primary) hypertension: Secondary | ICD-10-CM | POA: Insufficient documentation

## 2012-10-02 DIAGNOSIS — M25559 Pain in unspecified hip: Secondary | ICD-10-CM

## 2012-10-02 DIAGNOSIS — E785 Hyperlipidemia, unspecified: Secondary | ICD-10-CM

## 2012-10-02 DIAGNOSIS — F32A Depression, unspecified: Secondary | ICD-10-CM | POA: Insufficient documentation

## 2012-10-02 DIAGNOSIS — K219 Gastro-esophageal reflux disease without esophagitis: Secondary | ICD-10-CM | POA: Insufficient documentation

## 2012-10-02 DIAGNOSIS — G8929 Other chronic pain: Secondary | ICD-10-CM

## 2012-10-02 DIAGNOSIS — F329 Major depressive disorder, single episode, unspecified: Secondary | ICD-10-CM

## 2012-10-02 DIAGNOSIS — M25551 Pain in right hip: Secondary | ICD-10-CM

## 2012-10-02 LAB — COMPLETE METABOLIC PANEL WITH GFR
ALT: 20 U/L (ref 0–35)
AST: 23 U/L (ref 0–37)
Alkaline Phosphatase: 47 U/L (ref 39–117)
BUN: 16 mg/dL (ref 6–23)
Calcium: 11.1 mg/dL — ABNORMAL HIGH (ref 8.4–10.5)
Chloride: 102 mEq/L (ref 96–112)
Creat: 0.65 mg/dL (ref 0.50–1.10)
Total Bilirubin: 0.5 mg/dL (ref 0.3–1.2)

## 2012-10-02 MED ORDER — CYCLOBENZAPRINE HCL 5 MG PO TABS: 5.0000 mg | ORAL_TABLET | Freq: Three times a day (TID) | ORAL | Status: DC | PRN

## 2012-10-02 MED ORDER — TRAMADOL HCL 50 MG PO TABS: 50.0000 mg | ORAL_TABLET | Freq: Three times a day (TID) | ORAL | Status: DC | PRN

## 2012-10-02 MED ORDER — CLORAZEPATE DIPOTASSIUM 7.5 MG PO TABS: 7.5000 mg | ORAL_TABLET | Freq: Two times a day (BID) | ORAL | Status: DC

## 2012-10-02 NOTE — Progress Notes (Signed)
Subjective:    Patient ID: Caitlin Mccarthy, female    DOB: 05/22/1936, 77 y.o.   MRN: [id number]  Hypertension This is a chronic problem. The current episode started more than 1 year ago. The problem has been gradually improving since onset. The problem is controlled. Pertinent negatives include no blurred vision, chest pain, palpitations, peripheral edema or shortness of breath. Risk factors for coronary artery disease include dyslipidemia, sedentary lifestyle and smoking/tobacco exposure. Past treatments include ACE inhibitors and diuretics. There are no compliance problems.   Hyperlipidemia This is a chronic problem. The current episode started more than 1 year ago. The problem is uncontrolled. Recent lipid tests were reviewed and are high. Pertinent negatives include no chest pain, leg pain, myalgias or shortness of breath. Current antihyperlipidemic treatment includes statins. There are no compliance problems.   Gastrophageal Reflux She complains of coughing and heartburn. She reports no abdominal pain or no chest pain. This is a chronic problem. The current episode started more than 1 year ago. The problem occurs occasionally. The problem has been gradually improving. The heartburn duration is an hour. The heartburn is located in the substernum. The heartburn is of mild intensity. Nothing aggravates the symptoms. She has tried a PPI for the symptoms. The treatment provided moderate relief.      Review of Systems  Eyes: Negative for blurred vision.  Respiratory: Positive for cough. Negative for shortness of breath.   Cardiovascular: Negative for chest pain and palpitations.  Gastrointestinal: Positive for heartburn. Negative for abdominal pain.  Musculoskeletal: Negative for myalgias.  All other systems reviewed and are negative.       Objective:   Physical Exam  Constitutional: She is oriented to person, place, and time. She appears well-developed and well-nourished.  HENT:   Nose: Nose normal.  Mouth/Throat: Oropharynx is clear and moist.  Eyes: EOM are normal.  Neck: Trachea normal, normal range of motion and full passive range of motion without pain. Neck supple. No JVD present. Carotid bruit is not present. No thyromegaly present.  Cardiovascular: Normal rate, regular rhythm, normal heart sounds and intact distal pulses.  Exam reveals no gallop and no friction rub.   No murmur heard. Pulmonary/Chest: Effort normal and breath sounds normal.  Abdominal: Soft. Bowel sounds are normal. She exhibits no distension and no mass. There is no tenderness.  Musculoskeletal: Normal range of motion.  Lymphadenopathy:    She has no cervical adenopathy.  Neurological: She is alert and oriented to person, place, and time. She has normal reflexes.  Skin: Skin is warm and dry.  Psychiatric: She has a normal mood and affect. Her behavior is normal. Judgment and thought content normal.    BP 134/77  Pulse 77  Temp(Src) 96.3 F (35.7 C) (Oral)  Ht 5\' 6"  (1.676 m)  Wt 233 lb (105.688 kg)  BMI 37.63 kg/m2       Assessment & Plan:  1. Essential hypertension, benign Decrease Na+ in diet Exercise - COMPLETE METABOLIC PANEL WITH GFR  2. Other and unspecified hyperlipidemia Low fat diet - NMR Lipoprofile with Lipids  3. GERD (gastroesophageal reflux disease) Avoid spicy foods and chocolate  4. Depression - clorazepate (TRANXENE) 7.5 MG tablet; Take 1 tablet (7.5 mg total) by mouth 2 (two) times daily.  Dispense: 60 tablet; Refill: 0  5. Hip pain, chronic, right - traMADol (ULTRAM) 50 MG tablet; Take 1 tablet (50 mg total) by mouth every 8 (eight) hours as needed.  Dispense: 90 tablet; Refill: 0 - cyclobenzaprine (  FLEXERIL) 5 MG tablet; Take 1 tablet (5 mg total) by mouth 3 (three) times daily as needed for muscle spasms.  Dispense: 30 tablet; Refill: 2  Mary-Margaret Daphine Deutscher, FNP

## 2012-10-02 NOTE — Patient Instructions (Signed)

## 2012-10-05 LAB — NMR LIPOPROFILE WITH LIPIDS
Cholesterol, Total: 158 mg/dL (ref <200)
HDL Particle Number: 34.9 umol/L (ref >=30.5)
HDL-C: 41 mg/dL (ref >=40)
LDL (calc): 92 mg/dL (ref <100)
LDL Particle Number: 1349 nmol/L — ABNORMAL HIGH (ref <1000)
LP-IR Score: 75 — ABNORMAL HIGH (ref <=45)
Triglycerides: 126 mg/dL (ref <150)
VLDL Size: 48.3 nm — ABNORMAL HIGH (ref <=46.6)

## 2012-10-22 ENCOUNTER — Other Ambulatory Visit: Payer: Self-pay | Admitting: *Deleted

## 2012-10-22 MED ORDER — AMLODIPINE BESY-BENAZEPRIL HCL 10-40 MG PO CAPS: 1.0000 | ORAL_CAPSULE | Freq: Every day | ORAL | Status: DC

## 2012-11-06 ENCOUNTER — Other Ambulatory Visit: Payer: Self-pay

## 2012-11-06 DIAGNOSIS — G8929 Other chronic pain: Secondary | ICD-10-CM

## 2012-11-06 MED ORDER — TRAMADOL HCL 50 MG PO TABS: 50.0000 mg | ORAL_TABLET | Freq: Three times a day (TID) | ORAL | Status: DC | PRN

## 2012-11-06 NOTE — Telephone Encounter (Signed)
Last filled 10/02/12   If approved print and have nurse call patient to pick up

## 2012-11-25 ENCOUNTER — Other Ambulatory Visit: Payer: Self-pay | Admitting: *Deleted

## 2012-11-25 MED ORDER — HYDROCHLOROTHIAZIDE 25 MG PO TABS: 25.0000 mg | ORAL_TABLET | Freq: Every day | ORAL | Status: DC

## 2012-12-25 ENCOUNTER — Other Ambulatory Visit: Payer: Self-pay | Admitting: *Deleted

## 2012-12-25 DIAGNOSIS — F329 Major depressive disorder, single episode, unspecified: Secondary | ICD-10-CM

## 2012-12-25 MED ORDER — CLORAZEPATE DIPOTASSIUM 7.5 MG PO TABS: 7.5000 mg | ORAL_TABLET | Freq: Two times a day (BID) | ORAL | Status: DC

## 2012-12-25 NOTE — Telephone Encounter (Signed)
Patient last seen in office on 10-02-12 by MMM. Rx last filled on 10-14-12 for #[id number]. If approved please have nurse phone in to [facility]. Thank you

## 2012-12-25 NOTE — Telephone Encounter (Signed)
Called in.

## 2012-12-25 NOTE — Telephone Encounter (Signed)
Please call in rx for clorazepate with 2 refills

## 2013-01-04 ENCOUNTER — Ambulatory Visit: Payer: Medicare HMO | Admitting: Nurse Practitioner

## 2013-01-06 ENCOUNTER — Ambulatory Visit (INDEPENDENT_AMBULATORY_CARE_PROVIDER_SITE_OTHER): Payer: Medicare HMO | Admitting: Nurse Practitioner

## 2013-01-06 ENCOUNTER — Encounter: Payer: Self-pay | Admitting: Nurse Practitioner

## 2013-01-06 VITALS — BP 140/72 | HR 89 | Temp 97.1°F | Ht 70.0 in | Wt 231.0 lb

## 2013-01-06 DIAGNOSIS — T8140XA Infection following a procedure, unspecified, initial encounter: Secondary | ICD-10-CM

## 2013-01-06 MED ORDER — CIPROFLOXACIN HCL 250 MG PO TABS: 250.0000 mg | ORAL_TABLET | Freq: Two times a day (BID) | ORAL | Status: DC

## 2013-01-06 NOTE — Patient Instructions (Signed)
Wound Care Wound care helps prevent pain and infection.  You may need a tetanus shot if:  You cannot remember when you had your last tetanus shot.  You have never had a tetanus shot.  The injury broke your skin. If you need a tetanus shot and you choose not to have one, you may get tetanus. Sickness from tetanus can be serious. HOME CARE   Only take medicine as told by your doctor.  Clean the wound daily with mild soap and water.  Change any bandages (dressings) as told by your doctor.  Put medicated cream and a bandage on the wound as told by your doctor.  Change the bandage if it gets wet, dirty, or starts to smell.  Take showers. Do not take baths, swim, or do anything that puts your wound under water.  Rest and raise (elevate) the wound until the pain and puffiness (swelling) are better.  Keep all doctor visits as told. GET HELP RIGHT AWAY IF:   Yellowish-white fluid (pus) comes from the wound.  Medicine does not lessen your pain.  There is a red streak going away from the wound.  You have a fever. MAKE SURE YOU:   Understand these instructions.  Will watch your condition.  Will get help right away if you are not doing well or get worse. Document Released: 03/26/2008 Document Revised: 09/09/2011 Document Reviewed: 10/21/2010 [employer] Patient Information 2014 [employer].  

## 2013-01-06 NOTE — Progress Notes (Signed)
  Subjective:    Patient ID: Caitlin Mccarthy, female    DOB: [birth year], 77 y.o.   MRN: [id number]  HPI  Dr. Margo Aye removed a skin cancer lesion off of  right lower leg 3 weeks ago- is red and angry looking and sore to the touch. Patient called his office and they could not see her until Friday and she was afraid to wait until then.    Review of Systems  All other systems reviewed and are negative.       Objective:   Physical Exam  Constitutional: She appears well-developed and well-nourished.  Cardiovascular: Normal rate and normal heart sounds.   Pulmonary/Chest: Effort normal and breath sounds normal.  Skin:  4cm annular lesion - indented and balckened center with blck edges- moist appearing    BP 140/72  Pulse 89  Temp(Src) 97.1 F (36.2 C) (Oral)  Ht 5\' 10"  (1.778 m)  Wt 231 lb (104.781 kg)  BMI 33.15 kg/m2       Assessment & Plan:  1. Wound infection after surgery, initial encounter Keep clean and dry - ciprofloxacin (CIPRO) 250 MG tablet; Take 1 tablet (250 mg total) by mouth 2 (two) times daily.  Dispense: 6 tablet; Refill: 0 - AMB referral to wound care center [facility] Daphine Deutscher, FNP

## 2013-01-08 ENCOUNTER — Telehealth: Payer: Self-pay | Admitting: *Deleted

## 2013-01-08 NOTE — Telephone Encounter (Signed)
Patient notified

## 2013-01-08 NOTE — Telephone Encounter (Signed)
States she was seen this week for an infected leg. She has an appt with the wound center on Monday. Does she need to remove the dressing you put on and clean? Please advise

## 2013-01-08 NOTE — Telephone Encounter (Signed)
Yes- but cover back up after cleaning

## 2013-01-13 ENCOUNTER — Other Ambulatory Visit: Payer: Self-pay

## 2013-01-13 DIAGNOSIS — M25551 Pain in right hip: Secondary | ICD-10-CM

## 2013-01-13 MED ORDER — TRAMADOL HCL 50 MG PO TABS: 50.0000 mg | ORAL_TABLET | Freq: Three times a day (TID) | ORAL | Status: DC | PRN

## 2013-01-13 NOTE — Telephone Encounter (Signed)
Last filled 11/06/12   Last seen 10/02/12  MMM    If approved print and have nurse call patient to pick up

## 2013-01-25 ENCOUNTER — Other Ambulatory Visit: Payer: Self-pay

## 2013-01-25 MED ORDER — NYSTATIN 100000 UNIT/GM EX CREA: TOPICAL_CREAM | Freq: Two times a day (BID) | CUTANEOUS | Status: DC

## 2013-01-25 NOTE — Telephone Encounter (Signed)
Last seen 10/02/12  MMM

## 2013-04-02 ENCOUNTER — Encounter: Payer: Self-pay | Admitting: Nurse Practitioner

## 2013-04-02 ENCOUNTER — Ambulatory Visit (INDEPENDENT_AMBULATORY_CARE_PROVIDER_SITE_OTHER): Payer: Medicare HMO | Admitting: Nurse Practitioner

## 2013-04-02 VITALS — BP 124/68 | HR 83 | Temp 97.2°F | Ht 70.0 in | Wt 226.0 lb

## 2013-04-02 DIAGNOSIS — F329 Major depressive disorder, single episode, unspecified: Secondary | ICD-10-CM

## 2013-04-02 DIAGNOSIS — M169 Osteoarthritis of hip, unspecified: Secondary | ICD-10-CM

## 2013-04-02 DIAGNOSIS — G8929 Other chronic pain: Secondary | ICD-10-CM

## 2013-04-02 DIAGNOSIS — M25559 Pain in unspecified hip: Secondary | ICD-10-CM

## 2013-04-02 DIAGNOSIS — K219 Gastro-esophageal reflux disease without esophagitis: Secondary | ICD-10-CM

## 2013-04-02 DIAGNOSIS — Z23 Encounter for immunization: Secondary | ICD-10-CM

## 2013-04-02 DIAGNOSIS — I10 Essential (primary) hypertension: Secondary | ICD-10-CM

## 2013-04-02 DIAGNOSIS — E785 Hyperlipidemia, unspecified: Secondary | ICD-10-CM

## 2013-04-02 MED ORDER — AMLODIPINE BESY-BENAZEPRIL HCL 10-40 MG PO CAPS: 1.0000 | ORAL_CAPSULE | Freq: Every day | ORAL | Status: DC

## 2013-04-02 MED ORDER — FENOFIBRATE 145 MG PO TABS: 145.0000 mg | ORAL_TABLET | Freq: Every day | ORAL | Status: DC

## 2013-04-02 MED ORDER — CITALOPRAM HYDROBROMIDE 20 MG PO TABS: 20.0000 mg | ORAL_TABLET | Freq: Every day | ORAL | Status: DC

## 2013-04-02 MED ORDER — CLORAZEPATE DIPOTASSIUM 7.5 MG PO TABS: 7.5000 mg | ORAL_TABLET | Freq: Two times a day (BID) | ORAL | Status: DC

## 2013-04-02 MED ORDER — HYDROCHLOROTHIAZIDE 25 MG PO TABS: 25.0000 mg | ORAL_TABLET | Freq: Every day | ORAL | Status: DC

## 2013-04-02 MED ORDER — CYCLOBENZAPRINE HCL 5 MG PO TABS: 5.0000 mg | ORAL_TABLET | Freq: Three times a day (TID) | ORAL | Status: DC | PRN

## 2013-04-02 MED ORDER — OMEPRAZOLE 40 MG PO CPDR: 40.0000 mg | DELAYED_RELEASE_CAPSULE | Freq: Every day | ORAL | Status: DC

## 2013-04-02 MED ORDER — ROSUVASTATIN CALCIUM 20 MG PO TABS: 20.0000 mg | ORAL_TABLET | Freq: Every day | ORAL | Status: DC

## 2013-04-02 MED ORDER — TRAMADOL HCL 50 MG PO TABS: 50.0000 mg | ORAL_TABLET | Freq: Three times a day (TID) | ORAL | Status: DC | PRN

## 2013-04-02 NOTE — Progress Notes (Signed)
Subjective:    Patient ID: Caitlin Mccarthy, female    DOB: 12-07-1935, 77 y.o.   MRN: [id number]  Hypertension This is a chronic problem. The current episode started more than 1 year ago. The problem has been gradually improving since onset. The problem is controlled. Associated symptoms include anxiety. Pertinent negatives include no blurred vision, chest pain, palpitations, peripheral edema or shortness of breath. Risk factors for coronary artery disease include dyslipidemia, sedentary lifestyle, smoking/tobacco exposure, post-menopausal state and family history. Past treatments include ACE inhibitors, diuretics and calcium channel blockers. The current treatment provides significant improvement. There are no compliance problems.  There is no history of retinopathy or a thyroid problem. There is no history of sleep apnea.  Hyperlipidemia This is a chronic problem. The current episode started more than 1 year ago. The problem is uncontrolled. Recent lipid tests were reviewed and are high. Factors aggravating her hyperlipidemia include fatty foods and smoking. Pertinent negatives include no chest pain, leg pain, myalgias or shortness of breath. Current antihyperlipidemic treatment includes statins. The current treatment provides significant improvement of lipids. There are no compliance problems.  Risk factors for coronary artery disease include dyslipidemia, family history, hypertension and post-menopausal.  Gastrophageal Reflux She reports no abdominal pain, no chest pain, no coughing or no heartburn. This is a chronic problem. The current episode started more than 1 year ago. The problem occurs rarely. The problem has been gradually improving. The heartburn duration is an hour. The heartburn is located in the substernum. The heartburn is of mild intensity. The symptoms are aggravated by certain foods. Pertinent negatives include no fatigue or muscle weakness. She has tried a PPI for the symptoms. The  treatment provided significant relief.   Anxiety/Depression Pt takes Celexa 20 mg- Pt feels doesn't feel like this is working as well as it should- Pt having a lot of anxiety in crowds and with house guests Arthritis  Pt has joint pain bilateral knees and hip- Pt takes the Ultram, celebrex, and flexeril-Working well   Review of Systems  Constitutional: Negative for fatigue.  Eyes: Negative for blurred vision.  Respiratory: Negative for cough and shortness of breath.   Cardiovascular: Negative for chest pain and palpitations.  Gastrointestinal: Negative for heartburn and abdominal pain.  Musculoskeletal: Negative for myalgias and muscle weakness.  All other systems reviewed and are negative.       Objective:   Physical Exam  Constitutional: She is oriented to person, place, and time. She appears well-developed and well-nourished.  HENT:  Nose: Nose normal.  Mouth/Throat: Oropharynx is clear and moist.  Eyes: EOM are normal.  Neck: Trachea normal, normal range of motion and full passive range of motion without pain. Neck supple. No JVD present. Carotid bruit is not present. No thyromegaly present.  Cardiovascular: Normal rate, regular rhythm, normal heart sounds and intact distal pulses.  Exam reveals no gallop and no friction rub.   No murmur heard. Pulmonary/Chest: Effort normal and breath sounds normal.  Abdominal: Soft. Bowel sounds are normal. She exhibits no distension and no mass. There is no tenderness.  Musculoskeletal: Normal range of motion.  Lymphadenopathy:    She has no cervical adenopathy.  Neurological: She is alert and oriented to person, place, and time. She has normal reflexes.  Skin: Skin is warm and dry.  Psychiatric: She has a normal mood and affect. Her behavior is normal. Judgment and thought content normal.    BP 124/68  Pulse 83  Temp(Src) 97.2 F (36.2 C) (Oral)  Ht 5\' 10"  (1.778 m)  Wt 226 lb (102.513 kg)  BMI 32.43 kg/m2       Assessment &  Plan:   1. Depression   2. Hip pain, chronic, right   3. Essential hypertension, benign   4. GERD (gastroesophageal reflux disease)   5. OA (osteoarthritis) of hip   6. Other and unspecified hyperlipidemia    Orders Placed This Encounter  Procedures  . CMP14+EGFR  . NMR, lipoprofile   Meds ordered this encounter  Medications  . citalopram (CELEXA) 20 MG tablet    Sig: Take 1 tablet (20 mg total) by mouth daily.    Dispense:  30 tablet    Refill:  5    Order Specific Question:  Supervising Provider    Answer:  Ernestina Penna [[id number]]  . clorazepate (TRANXENE) 7.5 MG tablet    Sig: Take 1 tablet (7.5 mg total) by mouth 2 (two) times daily.    Dispense:  60 tablet    Refill:  2    Order Specific Question:  Supervising Provider    Answer:  Ernestina Penna [[id number]]  . traMADol (ULTRAM) 50 MG tablet    Sig: Take 1 tablet (50 mg total) by mouth every 8 (eight) hours as needed.    Dispense:  90 tablet    Refill:  0    Order Specific Question:  Supervising Provider    Answer:  Ernestina Penna [[id number]]  . cyclobenzaprine (FLEXERIL) 5 MG tablet    Sig: Take 1 tablet (5 mg total) by mouth 3 (three) times daily as needed for muscle spasms.    Dispense:  30 tablet    Refill:  2    Order Specific Question:  Supervising Provider    Answer:  Ernestina Penna [[id number]]  . omeprazole (PRILOSEC) 40 MG capsule    Sig: Take 1 capsule (40 mg total) by mouth daily.    Dispense:  30 capsule    Refill:  5    Order Specific Question:  Supervising Provider    Answer:  Ernestina Penna [[id number]]  . hydrochlorothiazide (HYDRODIURIL) 25 MG tablet    Sig: Take 1 tablet (25 mg total) by mouth daily before breakfast.    Dispense:  30 tablet    Refill:  5    Order Specific Question:  Supervising Provider    Answer:  Ernestina Penna [[id number]]  . rosuvastatin (CRESTOR) 20 MG tablet    Sig: Take 1 tablet (20 mg total) by mouth at bedtime.    Dispense:  30 tablet    Refill:  5    Order Specific Question:   Supervising Provider    Answer:  Ernestina Penna [[id number]]  . fenofibrate (TRICOR) 145 MG tablet    Sig: Take 1 tablet (145 mg total) by mouth daily.    Dispense:  30 tablet    Refill:  5    Order Specific Question:  Supervising Provider    Answer:  Ernestina Penna [[id number]]  . amLODipine-benazepril (LOTREL) 10-40 MG per capsule    Sig: Take 1 capsule by mouth daily before breakfast.    Dispense:  30 capsule    Refill:  5    Order Specific Question:  Supervising Provider    Answer:  Deborra Medina    Continue all meds Labs pending Diet and exercise encouraged Health maintenance reviewed Follow up in 3 months Flu shot today  Mary-Margaret Daphine Deutscher, FNP

## 2013-04-02 NOTE — Patient Instructions (Signed)
Social Anxiety Disorder Almost everyone can feel some degree of discomfort in a given social situation. However, when you feel extreme fear of social encounters and it begins to interfere with your daily functioning, you have social anxiety disorder. There are two types of this disorder.  If you have the first type, you are extremely anxious in only a few specific situations. For instance, you become anxious when answering a question out loud in class or presenting at work.  If you have the second type, you experience overwhelming worry in most or all social experiences. This may include everything from going to a doctor's appointment, to eating in a restaurant, to entering a crowded room. When this disorder happens in very young children, it may be from a new babysitter or stranger that the child is not used to. In the very young it may show up as crying, tantrums, or withdrawal.  Most adults with social anxiety were shy and timid as children. If left untreated, these adults may appear quiet and passive in social situations. They may be highly sensitive to the criticism and disapproval of others. They may have no close friends outside of first degree relatives. They may be fearful of saying or doing something foolish or becoming emotional in front of others. As a result of these fears, they may avoid most social encounters and select jobs and personal activities that allow them to isolate themselves from others.  CAUSES  This disorder can result from the combination of several factors.   Your genetic makeup affects how sensitive you are and how much stress you can tolerate.  How you were raised as a child also plays a part. Research suggests that children raised with overprotective parents, excessive expectations, overly critical parents, low assertiveness, and/or emotional insecurity have increased feelings of anxiety.  A traumatic life event can also contribute to social anxiety; for example, being  pointed out and shamed in public or being repeatedly bullied. SYMPTOMS  This disorder is characterized by a fear of social situations. The anxiety is marked by:  Apprehension.  Nervousness.  A feeling of unease, worry, or tension. Anxiety may also be reflected in:  Blushing.  Restlessness.  Trembling.  Shortness of breath.  Sweating.  Muscle tics and twitches. At higher levels of anxiety, there also may be:  Increased heart rate.  A rise in blood pressure.  Rapid breathing.  Muscle tension. Experiencing these uncomfortable symptoms often enough can cause a person to avoid social situations. This can cause many problems in the anxiety sufferer's life.  TREATMENT  There are many types of treatment available for social anxiety disorder.  Group therapy allows you to see that you are not alone with these problems.  Individual therapy helps you address anxiety issues with a caring professional.  Relaxation techniques may be used as tools to help you overcome fear.  Hypnosis may help change the way you think about social settings.  Numerous medications are available that your caregiver can prescribe to help during a difficult time. Medications can be used for a brief period of time. The goal of this treatment is to help recondition you so that once you quit taking the medications, your anxiety will not return. PROGNOSIS  Social anxiety disorder is a common problem that is very treatable. Individuals who participate in treatment have a very high success rate. When treated properly, the prognosis is very good to excellent. Document Released: 05/16/2005 Document Revised: 09/09/2011 Document Reviewed: 05/12/2007 [facility] Patient Information 2014 [place], [place].

## 2013-04-03 LAB — CMP14+EGFR
ALT: 23 IU/L (ref 0–32)
AST: 24 IU/L (ref 0–40)
Albumin/Globulin Ratio: 1.6 (ref 1.1–2.5)
CO2: 26 mmol/L (ref 18–29)
Calcium: 10.8 mg/dL — ABNORMAL HIGH (ref 8.6–10.2)
Creatinine, Ser: 0.68 mg/dL (ref 0.57–1.00)
GFR calc non Af Amer: 86 mL/min/{1.73_m2} (ref >59)
Globulin, Total: 2.9 g/dL (ref 1.5–4.5)
Glucose: 110 mg/dL — ABNORMAL HIGH (ref 65–99)
Sodium: 139 mmol/L (ref 134–144)
Total Protein: 7.5 g/dL (ref 6.0–8.5)

## 2013-04-03 LAB — NMR, LIPOPROFILE
HDL Cholesterol by NMR: 39 mg/dL — ABNORMAL LOW (ref >=40)
HDL Particle Number: 30.9 umol/L (ref >=30.5)
LDLC SERPL CALC-MCNC: 105 mg/dL — ABNORMAL HIGH (ref <100)
Small LDL Particle Number: 1717 nmol/L — ABNORMAL HIGH (ref <=527)
Triglycerides by NMR: 177 mg/dL — ABNORMAL HIGH (ref <150)

## 2013-04-05 ENCOUNTER — Other Ambulatory Visit: Payer: Self-pay | Admitting: Nurse Practitioner

## 2013-04-05 MED ORDER — EZETIMIBE 10 MG PO TABS: 10.0000 mg | ORAL_TABLET | Freq: Every day | ORAL | Status: DC

## 2013-08-23 ENCOUNTER — Ambulatory Visit (INDEPENDENT_AMBULATORY_CARE_PROVIDER_SITE_OTHER): Payer: Medicare HMO | Admitting: Nurse Practitioner

## 2013-08-23 ENCOUNTER — Encounter: Payer: Self-pay | Admitting: Nurse Practitioner

## 2013-08-23 VITALS — BP 121/56 | HR 82 | Temp 96.9°F | Ht 70.0 in | Wt 229.0 lb

## 2013-08-23 DIAGNOSIS — Z9189 Other specified personal risk factors, not elsewhere classified: Secondary | ICD-10-CM

## 2013-08-23 DIAGNOSIS — M169 Osteoarthritis of hip, unspecified: Secondary | ICD-10-CM

## 2013-08-23 DIAGNOSIS — M1610 Unilateral primary osteoarthritis, unspecified hip: Secondary | ICD-10-CM

## 2013-08-23 DIAGNOSIS — F32A Depression, unspecified: Secondary | ICD-10-CM

## 2013-08-23 DIAGNOSIS — F329 Major depressive disorder, single episode, unspecified: Secondary | ICD-10-CM

## 2013-08-23 DIAGNOSIS — E785 Hyperlipidemia, unspecified: Secondary | ICD-10-CM

## 2013-08-23 DIAGNOSIS — I10 Essential (primary) hypertension: Secondary | ICD-10-CM

## 2013-08-23 DIAGNOSIS — R7989 Other specified abnormal findings of blood chemistry: Secondary | ICD-10-CM

## 2013-08-23 DIAGNOSIS — K219 Gastro-esophageal reflux disease without esophagitis: Secondary | ICD-10-CM

## 2013-08-23 DIAGNOSIS — Z9289 Personal history of other medical treatment: Secondary | ICD-10-CM

## 2013-08-23 DIAGNOSIS — F3289 Other specified depressive episodes: Secondary | ICD-10-CM

## 2013-08-23 MED ORDER — TRAMADOL HCL 50 MG PO TABS: 50.0000 mg | ORAL_TABLET | Freq: Three times a day (TID) | ORAL | Status: DC | PRN

## 2013-08-23 NOTE — Progress Notes (Signed)
Subjective:    Patient ID: Caitlin Mccarthy, female    DOB: December 13, 1935, 78 y.o.   MRN: [id number]  Patien tere today for follow up of chronic medical problems.  Hypertension This is a chronic problem. The current episode started more than 1 year ago. The problem has been gradually improving since onset. The problem is controlled. Pertinent negatives include no blurred vision, chest pain, palpitations, peripheral edema or shortness of breath. Risk factors for coronary artery disease include dyslipidemia, sedentary lifestyle and smoking/tobacco exposure. Past treatments include ACE inhibitors and diuretics. There are no compliance problems.   Hyperlipidemia This is a chronic problem. The current episode started more than 1 year ago. The problem is uncontrolled. Recent lipid tests were reviewed and are high. Pertinent negatives include no chest pain, leg pain, myalgias or shortness of breath. Current antihyperlipidemic treatment includes statins (patient stopped tricor and zetia because were to expensive.). There are no compliance problems.   Gastrophageal Reflux She complains of coughing and heartburn. She reports no abdominal pain or no chest pain. This is a chronic problem. The current episode started more than 1 year ago. The problem occurs occasionally. The problem has been gradually improving. The heartburn duration is an hour. The heartburn is located in the substernum. The heartburn is of mild intensity. Nothing aggravates the symptoms. She has tried a PPI for the symptoms. The treatment provided moderate relief.  hip pain Right hip- had total hip replacement over a year ago and has chronic pain- ultram helps.   Review of Systems  Eyes: Negative for blurred vision.  Respiratory: Positive for cough. Negative for shortness of breath.   Cardiovascular: Negative for chest pain and palpitations.  Gastrointestinal: Positive for heartburn. Negative for abdominal pain.  Musculoskeletal: Negative  for myalgias.  All other systems reviewed and are negative.       Objective:   Physical Exam  Constitutional: She is oriented to person, place, and time. She appears well-developed and well-nourished.  HENT:  Nose: Nose normal.  Mouth/Throat: Oropharynx is clear and moist.  Eyes: EOM are normal.  Neck: Trachea normal, normal range of motion and full passive range of motion without pain. Neck supple. No JVD present. Carotid bruit is not present. No thyromegaly present.  Cardiovascular: Normal rate, regular rhythm, normal heart sounds and intact distal pulses.  Exam reveals no gallop and no friction rub.   No murmur heard. Pulmonary/Chest: Effort normal and breath sounds normal.  Abdominal: Soft. Bowel sounds are normal. She exhibits no distension and no mass. There is no tenderness.  Musculoskeletal: Normal range of motion.  Lymphadenopathy:    She has no cervical adenopathy.  Neurological: She is alert and oriented to person, place, and time. She has normal reflexes.  Skin: Skin is warm and dry.  Psychiatric: She has a normal mood and affect. Her behavior is normal. Judgment and thought content normal.    BP 121/56  Pulse 82  Temp(Src) 96.9 F (36.1 C)  Ht _0  (1.778 m)  Wt 229 lb (103.874 kg)  BMI 32.86 kg/m2       Assessment & Plan:   1. Essential hypertension, benign   2. GERD (gastroesophageal reflux disease)   3. Other and unspecified hyperlipidemia   4. OA (osteoarthritis) of hip   5. Depression   6. H/O mammogram    Orders Placed This Encounter  Procedures  . MM Digital Screening    Standing Status: Future     Number of Occurrences:  Standing Expiration Date: 10/22/2014    Order Specific Question:  Reason for Exam (SYMPTOM  OR DIAGNOSIS REQUIRED)    Answer:  screening    Order Specific Question:  Preferred imaging location?    Answer:  External     Comments:  [facility]  . CMP14+EGFR  . NMR, lipoprofile   Meds ordered this encounter   Medications  . traMADol (ULTRAM) 50 MG tablet    Sig: Take 1 tablet (50 mg total) by mouth every 8 (eight) hours as needed.    Dispense:  90 tablet    Refill:  0    Order Specific Question:  Supervising Provider    Answer:  Chipper Herb [[id number]]    Labs pending Health maintenance reviewed Diet and exercise encouraged Continue all meds Follow up  In 3 months   [place], FNP

## 2013-08-23 NOTE — Patient Instructions (Signed)

## 2013-08-25 LAB — NMR, LIPOPROFILE
Cholesterol: 263 mg/dL — ABNORMAL HIGH (ref <200)
HDL CHOLESTEROL BY NMR: 34 mg/dL — AB (ref >=40)
HDL Particle Number: 30.3 umol/L — ABNORMAL LOW (ref >=30.5)
LDL PARTICLE NUMBER: 3346 nmol/L — AB (ref <1000)
LDL Size: 19.6 nm — ABNORMAL LOW (ref >20.5)
LP-IR Score: 76 — ABNORMAL HIGH (ref <=45)
Small LDL Particle Number: >2300 nmol/L — ABNORMAL HIGH (ref <=527)
Triglycerides by NMR: 549 mg/dL — ABNORMAL HIGH (ref <150)

## 2013-08-25 LAB — CMP14+EGFR
ALT: 27 IU/L (ref 0–32)
AST: 22 IU/L (ref 0–40)
Albumin/Globulin Ratio: 1.6 (ref 1.1–2.5)
Albumin: 4.5 g/dL (ref 3.5–4.8)
Alkaline Phosphatase: 71 IU/L (ref 39–117)
BUN/Creatinine Ratio: 27 — ABNORMAL HIGH (ref 11–26)
BUN: 20 mg/dL (ref 8–27)
CALCIUM: 11 mg/dL — AB (ref 8.7–10.3)
CO2: 24 mmol/L (ref 18–29)
CREATININE: 0.74 mg/dL (ref 0.57–1.00)
Chloride: 96 mmol/L — ABNORMAL LOW (ref 97–108)
GFR calc Af Amer: 92 mL/min/{1.73_m2} (ref >59)
GFR calc non Af Amer: 80 mL/min/{1.73_m2} (ref >59)
GLOBULIN, TOTAL: 2.8 g/dL (ref 1.5–4.5)
Glucose: 178 mg/dL — ABNORMAL HIGH (ref 65–99)
POTASSIUM: 4.2 mmol/L (ref 3.5–5.2)
SODIUM: 137 mmol/L (ref 134–144)
Total Bilirubin: 0.3 mg/dL (ref 0.0–1.2)
Total Protein: 7.3 g/dL (ref 6.0–8.5)

## 2013-09-03 NOTE — Addendum Note (Signed)
Addended by: Selmer Dominion on: 09/03/2013 11:06 AM   Modules accepted: Orders

## 2013-10-19 ENCOUNTER — Other Ambulatory Visit: Payer: Self-pay | Admitting: Nurse Practitioner

## 2013-10-21 ENCOUNTER — Telehealth: Payer: Self-pay | Admitting: Nurse Practitioner

## 2013-10-21 NOTE — Telephone Encounter (Signed)
rx has already been written

## 2013-10-21 NOTE — Telephone Encounter (Signed)
Prescription ready for pick up at front desk

## 2013-10-21 NOTE — Telephone Encounter (Signed)
rx ready for pickup 

## 2013-10-21 NOTE — Telephone Encounter (Signed)
Last seen 08/23/13  MMM   If approved print and route to nurse

## 2013-11-15 ENCOUNTER — Ambulatory Visit (INDEPENDENT_AMBULATORY_CARE_PROVIDER_SITE_OTHER): Payer: Medicare HMO | Admitting: Family Medicine

## 2013-11-15 VITALS — BP 135/72 | HR 78 | Temp 97.4°F | Ht 70.0 in | Wt 226.0 lb

## 2013-11-15 DIAGNOSIS — S91309A Unspecified open wound, unspecified foot, initial encounter: Secondary | ICD-10-CM

## 2013-11-15 DIAGNOSIS — S91339A Puncture wound without foreign body, unspecified foot, initial encounter: Secondary | ICD-10-CM

## 2013-11-15 MED ORDER — AMOXICILLIN 875 MG PO TABS: 875.0000 mg | ORAL_TABLET | Freq: Two times a day (BID) | ORAL | Status: DC

## 2013-11-15 NOTE — Progress Notes (Signed)
   Subjective:    Patient ID: Caitlin Mccarthy, female    DOB: 19-Oct-1935, 78 y.o.   MRN: [id number]  HPI  C/o splinter in right foot and swelling for 2 days.  She states her husband tried to get a splinter out with A needle.  Review of Systems No chest pain, SOB, HA, dizziness, vision change, N/V, diarrhea, constipation, dysuria, urinary urgency or frequency, myalgias, arthralgias or rash.     Objective:   Physical Exam  Right foot with puncture wound proximal right first toe and metatarsal region.  Right foot puncture wound injected with 2 cc's of lidocaine and then pressure applied and wound Explored with splinter forceps w/o any splinter produced.       Assessment & Plan:  Puncture wound of foot - Plan: amoxicillin (AMOXIL) 875 MG tablet Po bid x 10 days and then follow up prn, discussed may need [place] of foot to determine if splinter there If not better.  Lysbeth Penner FNP

## 2013-11-19 ENCOUNTER — Other Ambulatory Visit: Payer: Self-pay | Admitting: Nurse Practitioner

## 2013-11-24 ENCOUNTER — Ambulatory Visit: Payer: Medicare HMO | Admitting: Nurse Practitioner

## 2013-11-29 ENCOUNTER — Ambulatory Visit (INDEPENDENT_AMBULATORY_CARE_PROVIDER_SITE_OTHER): Payer: Medicare HMO | Admitting: Nurse Practitioner

## 2013-11-29 ENCOUNTER — Encounter: Payer: Self-pay | Admitting: Nurse Practitioner

## 2013-11-29 VITALS — BP 129/66 | HR 73 | Temp 97.5°F | Ht 70.0 in | Wt 224.0 lb

## 2013-11-29 DIAGNOSIS — M129 Arthropathy, unspecified: Secondary | ICD-10-CM

## 2013-11-29 DIAGNOSIS — K219 Gastro-esophageal reflux disease without esophagitis: Secondary | ICD-10-CM

## 2013-11-29 DIAGNOSIS — F329 Major depressive disorder, single episode, unspecified: Secondary | ICD-10-CM

## 2013-11-29 DIAGNOSIS — M1990 Unspecified osteoarthritis, unspecified site: Secondary | ICD-10-CM

## 2013-11-29 DIAGNOSIS — E785 Hyperlipidemia, unspecified: Secondary | ICD-10-CM

## 2013-11-29 DIAGNOSIS — M1610 Unilateral primary osteoarthritis, unspecified hip: Secondary | ICD-10-CM

## 2013-11-29 DIAGNOSIS — F3289 Other specified depressive episodes: Secondary | ICD-10-CM

## 2013-11-29 DIAGNOSIS — I10 Essential (primary) hypertension: Secondary | ICD-10-CM

## 2013-11-29 DIAGNOSIS — M169 Osteoarthritis of hip, unspecified: Secondary | ICD-10-CM

## 2013-11-29 DIAGNOSIS — F32A Depression, unspecified: Secondary | ICD-10-CM

## 2013-11-29 MED ORDER — BUPRENORPHINE 7.5 MCG/HR TD PTWK: 1.0000 | MEDICATED_PATCH | TRANSDERMAL | Status: DC

## 2013-11-29 MED ORDER — CYCLOBENZAPRINE HCL 5 MG PO TABS: 5.0000 mg | ORAL_TABLET | Freq: Three times a day (TID) | ORAL | Status: DC | PRN

## 2013-11-29 NOTE — Patient Instructions (Signed)

## 2013-11-29 NOTE — Progress Notes (Signed)
Subjective:    Patient ID: Caitlin Mccarthy, female    DOB: Jan 06, 1936, 78 y.o.   MRN: [id number]  Patien tere today for follow up of chronic medical problems.  Hypertension This is a chronic problem. The current episode started more than 1 year ago. The problem has been gradually improving since onset. The problem is controlled. Pertinent negatives include no blurred vision, chest pain, palpitations, peripheral edema or shortness of breath. Risk factors for coronary artery disease include dyslipidemia, sedentary lifestyle and smoking/tobacco exposure. Past treatments include ACE inhibitors and diuretics. There are no compliance problems.   Hyperlipidemia This is a chronic problem. The current episode started more than 1 year ago. The problem is uncontrolled. Recent lipid tests were reviewed and are high. Pertinent negatives include no chest pain, leg pain, myalgias or shortness of breath. Current antihyperlipidemic treatment includes statins (patient stopped tricor and zetia because were to expensive.). There are no compliance problems.   Gastrophageal Reflux She complains of coughing and heartburn. She reports no abdominal pain or no chest pain. This is a chronic problem. The current episode started more than 1 year ago. The problem occurs occasionally. The problem has been gradually improving. The heartburn duration is an hour. The heartburn is located in the substernum. The heartburn is of mild intensity. Nothing aggravates the symptoms. She has tried a PPI for the symptoms. The treatment provided moderate relief.  arthritis Had Total hip replacement 1 year ago- Say that all of her joints hurt - seems to migrate from one jointtoanother and the ultram is not helping anymore.   Review of Systems  Eyes: Negative for blurred vision.  Respiratory: Positive for cough. Negative for shortness of breath.   Cardiovascular: Negative for chest pain and palpitations.  Gastrointestinal: Positive for  heartburn. Negative for abdominal pain.  Musculoskeletal: Negative for myalgias.  All other systems reviewed and are negative.      Objective:   Physical Exam  Constitutional: She is oriented to person, place, and time. She appears well-developed and well-nourished.  HENT:  Nose: Nose normal.  Mouth/Throat: Oropharynx is clear and moist.  Eyes: EOM are normal.  Neck: Trachea normal, normal range of motion and full passive range of motion without pain. Neck supple. No JVD present. Carotid bruit is not present. No thyromegaly present.  Cardiovascular: Normal rate, regular rhythm, normal heart sounds and intact distal pulses.  Exam reveals no gallop and no friction rub.   No murmur heard. Pulmonary/Chest: Effort normal and breath sounds normal.  Abdominal: Soft. Bowel sounds are normal. She exhibits no distension and no mass. There is no tenderness.  Musculoskeletal: Normal range of motion.  Lymphadenopathy:    She has no cervical adenopathy.  Neurological: She is alert and oriented to person, place, and time. She has normal reflexes.  Skin: Skin is warm and dry.  Psychiatric: She has a normal mood and affect. Her behavior is normal. Judgment and thought content normal.    BP 129/66  Pulse 73  Temp(Src) 97.5 F (36.4 C) (Oral)  Ht '5\' 10"'  (1.778 m)  Wt 224 lb (101.606 kg)  BMI 32.14 kg/m2       Assessment & Plan:   1. Other and unspecified hyperlipidemia   2. OA (osteoarthritis) of hip   3. GERD (gastroesophageal reflux disease)   4. Essential hypertension, benign   5. Depression   6. Arthritis    Orders Placed This Encounter  Procedures  . CMP14+EGFR  . NMR, lipoprofile   Meds ordered this  encounter  Medications  . cyclobenzaprine (FLEXERIL) 5 MG tablet    Sig: Take 1 tablet (5 mg total) by mouth 3 (three) times daily as needed for muscle spasms.    Dispense:  30 tablet    Refill:  2    Order Specific Question:  Supervising Provider    Answer:  Chipper Herb  [[id number]]  . Buprenorphine 7.5 MCG/HR PTWK    Sig: Place 1 patch onto the skin once a week.    Dispense:  4 patch    Refill:  1    Order Specific Question:  Supervising Provider    Answer:  Chipper Herb [place] pending Health maintenance reviewed Diet and exercise encouraged Continue all meds Follow up  In 3 month    [place], FNP

## 2013-11-30 LAB — NMR, LIPOPROFILE
Cholesterol: 179 mg/dL (ref 100–199)
HDL Cholesterol by NMR: 35 mg/dL — ABNORMAL LOW (ref >39)
HDL Particle Number: 28.5 umol/L — ABNORMAL LOW (ref >=30.5)
LDL PARTICLE NUMBER: 1453 nmol/L — AB (ref <1000)
LDL SIZE: 20.3 nm (ref >20.5)
LDLC SERPL CALC-MCNC: 100 mg/dL — ABNORMAL HIGH (ref 0–99)
LP-IR SCORE: 65 — AB (ref <=45)
Small LDL Particle Number: 872 nmol/L — ABNORMAL HIGH (ref <=527)
TRIGLYCERIDES BY NMR: 220 mg/dL — AB (ref 0–149)

## 2013-11-30 LAB — CMP14+EGFR
ALK PHOS: 62 IU/L (ref 39–117)
ALT: 24 IU/L (ref 0–32)
AST: 29 IU/L (ref 0–40)
Albumin/Globulin Ratio: 1.5 (ref 1.1–2.5)
Albumin: 4.5 g/dL (ref 3.5–4.8)
BILIRUBIN TOTAL: 0.4 mg/dL (ref 0.0–1.2)
BUN/Creatinine Ratio: 21 (ref 11–26)
BUN: 13 mg/dL (ref 8–27)
CHLORIDE: 97 mmol/L (ref 97–108)
CO2: 22 mmol/L (ref 18–29)
Calcium: 10.8 mg/dL — ABNORMAL HIGH (ref 8.7–10.3)
Creatinine, Ser: 0.61 mg/dL (ref 0.57–1.00)
GFR calc non Af Amer: 89 mL/min/{1.73_m2} (ref >59)
GFR, EST AFRICAN AMERICAN: 103 mL/min/{1.73_m2} (ref >59)
GLUCOSE: 122 mg/dL — AB (ref 65–99)
Globulin, Total: 3.1 g/dL (ref 1.5–4.5)
POTASSIUM: 4.1 mmol/L (ref 3.5–5.2)
Sodium: 136 mmol/L (ref 134–144)
TOTAL PROTEIN: 7.6 g/dL (ref 6.0–8.5)

## 2013-12-14 ENCOUNTER — Telehealth: Payer: Self-pay | Admitting: *Deleted

## 2013-12-14 NOTE — Telephone Encounter (Signed)
Ins co denied Butrans patch because she must have tried and failed drugs on formulary which the two listed are fentanyl patch and morphine sulfate er, will either of these work?  Thanks

## 2013-12-15 NOTE — Telephone Encounter (Signed)
Butrans s not a Class III which is not as strong as Fentanyl and Morphine and is not as strong and I do not want her on Fentanyl or morphine because they are to strong for herKathlen Mccarthy you see if this will get [place] approval please and Thank you!

## 2013-12-17 ENCOUNTER — Other Ambulatory Visit: Payer: Self-pay | Admitting: Nurse Practitioner

## 2013-12-17 ENCOUNTER — Ambulatory Visit: Payer: Medicare HMO | Admitting: *Deleted

## 2013-12-17 DIAGNOSIS — M1990 Unspecified osteoarthritis, unspecified site: Secondary | ICD-10-CM

## 2013-12-17 MED ORDER — TRAMADOL HCL 50 MG PO TABS: 50.0000 mg | ORAL_TABLET | Freq: Three times a day (TID) | ORAL | Status: DC

## 2013-12-17 NOTE — Progress Notes (Signed)
Patient ID: Caitlin Mccarthy, female   DOB: Jul 16, 1935, 78 y.o.   MRN: [id number] Pt here for continued arthritis pain RX for Butrans that was given at previous appt did not get approved by Ins MMM gave pt rx for Tramadol until approval for the patches

## 2013-12-27 NOTE — Telephone Encounter (Signed)
Filed appeal and ins covered.

## 2014-02-11 ENCOUNTER — Telehealth: Payer: Self-pay | Admitting: Nurse Practitioner

## 2014-02-11 MED ORDER — TRAMADOL HCL 50 MG PO TABS: 50.0000 mg | ORAL_TABLET | Freq: Three times a day (TID) | ORAL | Status: DC

## 2014-02-11 NOTE — Telephone Encounter (Signed)
Patient aware.

## 2014-02-11 NOTE — Telephone Encounter (Signed)
rx ready for pickup 

## 2014-03-12 ENCOUNTER — Encounter: Payer: Self-pay | Admitting: Nurse Practitioner

## 2014-03-12 ENCOUNTER — Ambulatory Visit (INDEPENDENT_AMBULATORY_CARE_PROVIDER_SITE_OTHER): Payer: Medicare HMO | Admitting: Nurse Practitioner

## 2014-03-12 VITALS — BP 118/66 | HR 74 | Temp 97.5°F | Ht 70.0 in | Wt 220.0 lb

## 2014-03-12 DIAGNOSIS — B3749 Other urogenital candidiasis: Secondary | ICD-10-CM

## 2014-03-12 DIAGNOSIS — R300 Dysuria: Secondary | ICD-10-CM

## 2014-03-12 DIAGNOSIS — N3000 Acute cystitis without hematuria: Secondary | ICD-10-CM

## 2014-03-12 LAB — POCT UA - MICROSCOPIC ONLY
CRYSTALS, UR, HPF, POC: NEGATIVE
Casts, Ur, LPF, POC: NEGATIVE
Epithelial cells, urine per micros: NEGATIVE
Mucus, UA: NEGATIVE

## 2014-03-12 MED ORDER — CIPROFLOXACIN HCL 500 MG PO TABS: 500.0000 mg | ORAL_TABLET | Freq: Two times a day (BID) | ORAL | Status: DC

## 2014-03-12 MED ORDER — FLUCONAZOLE 150 MG PO TABS: ORAL_TABLET | ORAL | Status: DC

## 2014-03-12 NOTE — Patient Instructions (Signed)

## 2014-03-12 NOTE — Progress Notes (Signed)
   Subjective:    Patient ID: Caitlin Mccarthy, female    DOB: 11-03-1935, 78 y.o.   MRN: [id number]  HPI Patient in today c/o dysuira and frequency -started about 2 days ago- taking AZO which is helping with symptoms.    Review of Systems  Constitutional: Negative.   HENT: Negative.   Respiratory: Negative.   Cardiovascular: Negative.   Genitourinary: Positive for dysuria, urgency and frequency.  Neurological: Negative.   Psychiatric/Behavioral: Negative.   All other systems reviewed and are negative.      Objective:   Physical Exam  Constitutional: She is oriented to person, place, and time. She appears well-developed and well-nourished.  Cardiovascular: Normal rate, regular rhythm and normal heart sounds.   Pulmonary/Chest: Effort normal and breath sounds normal.  Abdominal: Soft. Bowel sounds are normal. There is tenderness (mild suprapubic pain on palpation).  Genitourinary:  NO CVA tenderness  Neurological: She is alert and oriented to person, place, and time.  Skin: Skin is warm and dry.  Psychiatric: She has a normal mood and affect. Her behavior is normal. Judgment and thought content normal.   BP 118/66  Pulse 74  Temp(Src) 97.5 F (36.4 C) (Oral)  Ht 5\' 10"  (1.778 m)  Wt 220 lb (99.791 kg)  BMI 31.57 kg/m2  Results for orders placed in visit on 03/12/14  POCT UA - MICROSCOPIC ONLY      Result Value Ref Range   WBC, Ur, HPF, POC 10-20     RBC, urine, microscopic 5-10     Bacteria, U Microscopic occasional     Mucus, UA negative     Epithelial cells, urine per micros negative     Crystals, Ur, HPF, POC negative     Casts, Ur, LPF, POC negative     Yeast, UA occasional            Assessment & Plan:   1. Dysuria   2. Acute cystitis without hematuria   3. Candida UTI    Meds ordered this encounter  Medications  . ciprofloxacin (CIPRO) 500 MG tablet    Sig: Take 1 tablet (500 mg total) by mouth 2 (two) times daily.    Dispense:  20 tablet   Refill:  0    Order Specific Question:  Supervising Provider    Answer:  Chipper Herb [[id number]]  . fluconazole (DIFLUCAN) 150 MG tablet    Sig: 1 po now and repeat in 1 week    Dispense:  2 tablet    Refill:  0    Order Specific Question:  Supervising Provider    Answer:  Joycelyn Man   Force fluids AZO over the counter X2 days RTO prn  Mary-Margaret Hassell Done, FNP

## 2014-04-05 ENCOUNTER — Telehealth: Payer: Self-pay | Admitting: Family Medicine

## 2014-04-05 MED ORDER — TRAMADOL HCL 50 MG PO TABS: 50.0000 mg | ORAL_TABLET | Freq: Three times a day (TID) | ORAL | Status: DC

## 2014-04-05 NOTE — Telephone Encounter (Signed)
LMOVM that Rx ready for pickup

## 2014-04-05 NOTE — Telephone Encounter (Signed)
rx ready for pickup 

## 2014-04-06 ENCOUNTER — Ambulatory Visit (INDEPENDENT_AMBULATORY_CARE_PROVIDER_SITE_OTHER): Payer: Medicare HMO

## 2014-04-06 DIAGNOSIS — Z23 Encounter for immunization: Secondary | ICD-10-CM

## 2014-04-18 ENCOUNTER — Other Ambulatory Visit: Payer: Self-pay | Admitting: Nurse Practitioner

## 2014-05-16 ENCOUNTER — Other Ambulatory Visit: Payer: Self-pay | Admitting: Nurse Practitioner

## 2014-05-16 MED ORDER — TRAMADOL HCL 50 MG PO TABS: 50.0000 mg | ORAL_TABLET | Freq: Three times a day (TID) | ORAL | Status: DC

## 2014-05-16 NOTE — Telephone Encounter (Signed)
Ultram rx ready for pick up  

## 2014-05-16 NOTE — Telephone Encounter (Signed)
Script ready.

## 2014-05-17 ENCOUNTER — Encounter: Payer: Self-pay | Admitting: Nurse Practitioner

## 2014-05-17 ENCOUNTER — Ambulatory Visit (INDEPENDENT_AMBULATORY_CARE_PROVIDER_SITE_OTHER): Payer: Medicare HMO | Admitting: Nurse Practitioner

## 2014-05-17 VITALS — BP 142/78 | HR 85 | Temp 96.7°F | Ht 70.0 in | Wt 223.0 lb

## 2014-05-17 DIAGNOSIS — F329 Major depressive disorder, single episode, unspecified: Secondary | ICD-10-CM

## 2014-05-17 DIAGNOSIS — I10 Essential (primary) hypertension: Secondary | ICD-10-CM

## 2014-05-17 DIAGNOSIS — M1990 Unspecified osteoarthritis, unspecified site: Secondary | ICD-10-CM

## 2014-05-17 DIAGNOSIS — K219 Gastro-esophageal reflux disease without esophagitis: Secondary | ICD-10-CM

## 2014-05-17 DIAGNOSIS — E785 Hyperlipidemia, unspecified: Secondary | ICD-10-CM

## 2014-05-17 DIAGNOSIS — F32A Depression, unspecified: Secondary | ICD-10-CM

## 2014-05-17 MED ORDER — ROSUVASTATIN CALCIUM 20 MG PO TABS: 20.0000 mg | ORAL_TABLET | Freq: Every day | ORAL | Status: DC

## 2014-05-17 MED ORDER — CITALOPRAM HYDROBROMIDE 20 MG PO TABS: 20.0000 mg | ORAL_TABLET | Freq: Every day | ORAL | Status: DC

## 2014-05-17 MED ORDER — HYDROCHLOROTHIAZIDE 25 MG PO TABS: 25.0000 mg | ORAL_TABLET | Freq: Every morning | ORAL | Status: DC

## 2014-05-17 MED ORDER — AMLODIPINE BESY-BENAZEPRIL HCL 10-40 MG PO CAPS: ORAL_CAPSULE | ORAL | Status: DC

## 2014-05-17 MED ORDER — OMEPRAZOLE 40 MG PO CPDR: 40.0000 mg | DELAYED_RELEASE_CAPSULE | Freq: Every day | ORAL | Status: DC

## 2014-05-17 NOTE — Patient Instructions (Signed)

## 2014-05-17 NOTE — Progress Notes (Signed)
Subjective:    Patient ID: Caitlin Mccarthy, female    DOB: 08/06/1935, 78 y.o.   MRN: [id number]  Patien tere today for follow up of chronic medical problems.  Hypertension This is a chronic problem. The current episode started more than 1 year ago. The problem is unchanged. The problem is controlled. Pertinent negatives include no palpitations or shortness of breath. Risk factors for coronary artery disease include dyslipidemia, family history and obesity. Past treatments include calcium channel blockers and ACE inhibitors. The current treatment provides moderate improvement. Hypertensive end-organ damage includes CAD/MI.  Hyperlipidemia This is a chronic problem. The current episode started more than 1 year ago. The problem is uncontrolled. Recent lipid tests were reviewed and are high. Exacerbating diseases include obesity. She has no history of diabetes or hypothyroidism. Pertinent negatives include no myalgias or shortness of breath. Current antihyperlipidemic treatment includes statins. The current treatment provides moderate improvement of lipids. Compliance problems include adherence to diet and adherence to exercise.  Risk factors for coronary artery disease include dyslipidemia, hypertension, obesity, post-menopausal and a sedentary lifestyle.  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 GERD Omeprazole daily keeps symptoms under control      Review of Systems  HENT: Negative.   Respiratory: Negative for shortness of breath.   Cardiovascular: Negative for palpitations.  Genitourinary: Negative.   Musculoskeletal: Negative for myalgias.  Neurological: Negative.   Psychiatric/Behavioral: Negative.   All other systems reviewed and are negative.      Objective:   Physical Exam  Constitutional: She is oriented to person, place, and time. She appears well-developed and well-nourished.  HENT:  Nose: Nose normal.   Mouth/Throat: Oropharynx is clear and moist.  Eyes: EOM are normal.  Neck: Trachea normal, normal range of motion and full passive range of motion without pain. Neck supple. No JVD present. Carotid bruit is not present. No thyromegaly present.  Cardiovascular: Normal rate, regular rhythm, normal heart sounds and intact distal pulses.  Exam reveals no gallop and no friction rub.   No murmur heard. Pulmonary/Chest: Effort normal and breath sounds normal.  Abdominal: Soft. Bowel sounds are normal. She exhibits no distension and no mass. There is no tenderness.  Musculoskeletal: Normal range of motion. She exhibits edema (mild edema).  Lymphadenopathy:    She has no cervical adenopathy.  Neurological: She is alert and oriented to person, place, and time. She has normal reflexes.  Skin: Skin is warm and dry.  Psychiatric: She has a normal mood and affect. Her behavior is normal. Judgment and thought content normal.    BP 142/78 mmHg  Pulse 85  Temp(Src) 96.7 F (35.9 C) (Oral)  Ht '5\' 10"'  (1.778 m)  Wt 223 lb (101.152 kg)  BMI 32.00 kg/m2       Assessment & Plan:   1. Essential hypertension, benign lw NA+ diet - amLODipine-benazepril (LOTREL) 10-40 MG per capsule; TAKE ONE CAPSULE EACH MORNING  Dispense: 30 capsule; Refill: 5 - CMP14+EGFR  2. Gastroesophageal reflux disease without esophagitis Avoid spicy and fatty food - omeprazole (PRILOSEC) 40 MG capsule; Take 1 capsule (40 mg total) by mouth daily.  Dispense: 30 capsule; Refill: 5  3. Hyperlipidemia with target LDL less than 100 Avoid fats in diet as much as possile - rosuvastatin (CRESTOR) 20 MG tablet; Take 1 tablet (20 mg total) by mouth at bedtime.  Dispense: 30 tablet; Refill: 5 - NMR, lipoprofile  4. Depression Stress management - citalopram (  CELEXA) 20 MG tablet; Take 1 tablet (20 mg total) by mouth daily.  Dispense: 30 tablet; Refill: 5  5. Arthritis - hydrochlorothiazide (HYDRODIURIL) 25 MG tablet; Take 1  tablet (25 mg total) by mouth every morning.  Dispense: 30 tablet; Refill: 5    Labs pending Health maintenance reviewed Diet and exercise encouraged Continue all meds Follow up  In 3 month   [place], FNP

## 2014-05-18 ENCOUNTER — Encounter: Payer: Self-pay | Admitting: Nurse Practitioner

## 2014-05-18 LAB — CMP14+EGFR
ALBUMIN: 4.3 g/dL (ref 3.5–4.8)
ALT: 29 IU/L (ref 0–32)
AST: 34 IU/L (ref 0–40)
Albumin/Globulin Ratio: 1.2 (ref 1.1–2.5)
Alkaline Phosphatase: 67 IU/L (ref 39–117)
BUN/Creatinine Ratio: 23 (ref 11–26)
BUN: 13 mg/dL (ref 8–27)
CALCIUM: 10.8 mg/dL — AB (ref 8.7–10.3)
CO2: 24 mmol/L (ref 18–29)
Chloride: 97 mmol/L (ref 97–108)
Creatinine, Ser: 0.57 mg/dL (ref 0.57–1.00)
GFR calc Af Amer: 105 mL/min/{1.73_m2} (ref >59)
GFR calc non Af Amer: 91 mL/min/{1.73_m2} (ref >59)
Globulin, Total: 3.5 g/dL (ref 1.5–4.5)
Glucose: 116 mg/dL — ABNORMAL HIGH (ref 65–99)
Potassium: 4.3 mmol/L (ref 3.5–5.2)
Sodium: 138 mmol/L (ref 134–144)
Total Bilirubin: 0.2 mg/dL (ref 0.0–1.2)
Total Protein: 7.8 g/dL (ref 6.0–8.5)

## 2014-05-18 LAB — NMR, LIPOPROFILE
CHOLESTEROL: 242 mg/dL — AB (ref 100–199)
HDL Cholesterol by NMR: 32 mg/dL — ABNORMAL LOW (ref >39)
HDL Particle Number: 24.9 umol/L — ABNORMAL LOW (ref >=30.5)
LDL Particle Number: 2594 nmol/L — ABNORMAL HIGH (ref <1000)
LDL Size: 19.9 nm (ref >20.5)
LDL-C: 134 mg/dL — AB (ref 0–99)
LP-IR Score: 97 — ABNORMAL HIGH (ref <=45)
Small LDL Particle Number: 1911 nmol/L — ABNORMAL HIGH (ref <=527)
Triglycerides by NMR: 378 mg/dL — ABNORMAL HIGH (ref 0–149)

## 2014-06-15 ENCOUNTER — Other Ambulatory Visit: Payer: Self-pay | Admitting: Nurse Practitioner

## 2014-07-04 ENCOUNTER — Telehealth: Payer: Self-pay | Admitting: Nurse Practitioner

## 2014-07-04 MED ORDER — TRAMADOL HCL 50 MG PO TABS: 50.0000 mg | ORAL_TABLET | Freq: Three times a day (TID) | ORAL | Status: DC

## 2014-07-04 NOTE — Telephone Encounter (Signed)
Ultram rx ready for pick up  

## 2014-07-19 DIAGNOSIS — L570 Actinic keratosis: Secondary | ICD-10-CM | POA: Diagnosis not present

## 2014-07-19 DIAGNOSIS — Z85828 Personal history of other malignant neoplasm of skin: Secondary | ICD-10-CM | POA: Diagnosis not present

## 2014-07-19 DIAGNOSIS — C44722 Squamous cell carcinoma of skin of right lower limb, including hip: Secondary | ICD-10-CM | POA: Diagnosis not present

## 2014-07-19 DIAGNOSIS — D485 Neoplasm of uncertain behavior of skin: Secondary | ICD-10-CM | POA: Diagnosis not present

## 2014-07-28 DIAGNOSIS — C44721 Squamous cell carcinoma of skin of unspecified lower limb, including hip: Secondary | ICD-10-CM | POA: Diagnosis not present

## 2014-08-16 ENCOUNTER — Telehealth: Payer: Self-pay

## 2014-08-16 ENCOUNTER — Telehealth: Payer: Self-pay | Admitting: Nurse Practitioner

## 2014-08-16 MED ORDER — TRAMADOL HCL 50 MG PO TABS: 50.0000 mg | ORAL_TABLET | Freq: Three times a day (TID) | ORAL | Status: DC

## 2014-08-16 NOTE — Telephone Encounter (Signed)
Pt aware rx ready to be picked up °

## 2014-08-16 NOTE — Telephone Encounter (Signed)
Ultram rx ready for pick up  

## 2014-08-16 NOTE — Telephone Encounter (Signed)
Needs a referral

## 2014-08-16 NOTE — Telephone Encounter (Signed)
Needs referral for what

## 2014-08-26 ENCOUNTER — Encounter: Payer: Self-pay | Admitting: Nurse Practitioner

## 2014-08-26 ENCOUNTER — Ambulatory Visit (INDEPENDENT_AMBULATORY_CARE_PROVIDER_SITE_OTHER): Payer: Commercial Managed Care - HMO | Admitting: Nurse Practitioner

## 2014-08-26 VITALS — BP 146/81 | HR 74 | Temp 96.8°F | Ht 70.0 in | Wt 225.0 lb

## 2014-08-26 DIAGNOSIS — I10 Essential (primary) hypertension: Secondary | ICD-10-CM | POA: Diagnosis not present

## 2014-08-26 DIAGNOSIS — E8881 Metabolic syndrome: Secondary | ICD-10-CM

## 2014-08-26 DIAGNOSIS — E785 Hyperlipidemia, unspecified: Secondary | ICD-10-CM

## 2014-08-26 DIAGNOSIS — K219 Gastro-esophageal reflux disease without esophagitis: Secondary | ICD-10-CM

## 2014-08-26 DIAGNOSIS — F329 Major depressive disorder, single episode, unspecified: Secondary | ICD-10-CM

## 2014-08-26 DIAGNOSIS — F32A Depression, unspecified: Secondary | ICD-10-CM

## 2014-08-26 NOTE — Patient Instructions (Signed)

## 2014-08-26 NOTE — Progress Notes (Signed)
Subjective:    Patient ID: Caitlin Mccarthy, female    DOB: May 21, 1936, 79 y.o.   MRN: [id number]  Patien tere today for follow up of chronic medical problems. Reports experiencing polydipsia that is unchanged from last visit.   Hypertension This is a chronic problem. The current episode started more than 1 year ago. The problem is unchanged. The problem is controlled. Pertinent negatives include no blurred vision, chest pain, palpitations, peripheral edema or shortness of breath. Risk factors for coronary artery disease include dyslipidemia, family history, obesity, smoking/tobacco exposure, post-menopausal state and sedentary lifestyle. Past treatments include calcium channel blockers, ACE inhibitors and diuretics. The current treatment provides moderate improvement. Hypertensive end-organ damage includes CAD/MI.  Hyperlipidemia This is a chronic problem. The current episode started more than 1 year ago. The problem is uncontrolled. Recent lipid tests were reviewed and are high. Exacerbating diseases include obesity. She has no history of diabetes or hypothyroidism. Pertinent negatives include no chest pain, myalgias or shortness of breath. Current antihyperlipidemic treatment includes statins. The current treatment provides moderate improvement of lipids. Compliance problems include adherence to diet and adherence to exercise.  Risk factors for coronary artery disease include dyslipidemia, hypertension, obesity, post-menopausal and a sedentary lifestyle.  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 GERD Omeprazole daily keeps symptoms under control      Review of Systems  HENT: Negative.   Eyes: Negative.  Negative for blurred vision.  Respiratory: Negative.  Negative for shortness of breath.   Cardiovascular: Negative.  Negative for chest pain, palpitations and leg swelling.  Gastrointestinal: Negative.   Endocrine:  Positive for polydipsia. Negative for polyphagia. Polyuria: no more than usual with diuretic.   Genitourinary: Negative.   Musculoskeletal: Negative for myalgias.  Skin: Negative.   Neurological: Negative.   Psychiatric/Behavioral: Negative.   All other systems reviewed and are negative.      Objective:   Physical Exam  Constitutional: She is oriented to person, place, and time. She appears well-developed and well-nourished.  HENT:  Nose: Nose normal.  Mouth/Throat: Oropharynx is clear and moist.  Eyes: EOM are normal.  Neck: Trachea normal, normal range of motion and full passive range of motion without pain. Neck supple. No JVD present. Carotid bruit is not present. No thyromegaly present.  Cardiovascular: Normal rate, regular rhythm, normal heart sounds and intact distal pulses.  Exam reveals no gallop and no friction rub.   No murmur heard. Pulmonary/Chest: Effort normal and breath sounds normal. No respiratory distress.  Abdominal: Soft. Bowel sounds are normal. She exhibits no distension and no mass. There is no tenderness.  Musculoskeletal: Normal range of motion. She exhibits no edema.  Lymphadenopathy:    She has no cervical adenopathy.  Neurological: She is alert and oriented to person, place, and time. She has normal reflexes.  Skin: Skin is warm and dry.  Psychiatric: She has a normal mood and affect. Her behavior is normal. Judgment and thought content normal.   BP 146/81 mmHg  Pulse 74  Temp(Src) 96.8 F (36 C) (Oral)  Ht _0  (1.778 m)  Wt 225 lb (102.059 kg)  BMI 32.28 kg/m2       Assessment & Plan:  1. Essential hypertension, benign Keep diary of blood pressure Have nurse at office chek in 2 weeks - CMP14+EGFR  2. Gastroesophageal reflux disease without esophagitis Avoid spicya nad fatty foods  3. Metabolic syndrome Carb counting  4. Hyperlipidemia with target LDL  less than 100 Low fat diet - NMR, lipoprofile  5. Depression Stress  management    Labs pending Health maintenance reviewed Diet and exercise encouraged Continue all meds Follow up  In 3 month   [place], FNP

## 2014-08-27 LAB — NMR, LIPOPROFILE
Cholesterol: 167 mg/dL (ref 100–199)
HDL Cholesterol by NMR: 34 mg/dL — ABNORMAL LOW (ref >39)
HDL Particle Number: 28.7 umol/L — ABNORMAL LOW (ref >=30.5)
LDL Particle Number: 1394 nmol/L — ABNORMAL HIGH (ref <1000)
LDL SIZE: 20.3 nm (ref >20.5)
LDL-C: 81 mg/dL (ref 0–99)
LP-IR SCORE: 79 — AB (ref <=45)
Small LDL Particle Number: 795 nmol/L — ABNORMAL HIGH (ref <=527)
TRIGLYCERIDES BY NMR: 261 mg/dL — AB (ref 0–149)

## 2014-08-27 LAB — CMP14+EGFR
ALBUMIN: 4.5 g/dL (ref 3.5–4.8)
ALT: 24 IU/L (ref 0–32)
AST: 24 IU/L (ref 0–40)
Albumin/Globulin Ratio: 1.5 (ref 1.1–2.5)
Alkaline Phosphatase: 71 IU/L (ref 39–117)
BUN / CREAT RATIO: 24 (ref 11–26)
BUN: 16 mg/dL (ref 8–27)
Bilirubin Total: 0.3 mg/dL (ref 0.0–1.2)
CHLORIDE: 97 mmol/L (ref 97–108)
CO2: 25 mmol/L (ref 18–29)
CREATININE: 0.68 mg/dL (ref 0.57–1.00)
Calcium: 11.3 mg/dL — ABNORMAL HIGH (ref 8.7–10.3)
GFR calc non Af Amer: 85 mL/min/{1.73_m2} (ref >59)
GFR, EST AFRICAN AMERICAN: 98 mL/min/{1.73_m2} (ref >59)
GLOBULIN, TOTAL: 3.1 g/dL (ref 1.5–4.5)
Glucose: 145 mg/dL — ABNORMAL HIGH (ref 65–99)
Potassium: 4.5 mmol/L (ref 3.5–5.2)
SODIUM: 138 mmol/L (ref 134–144)
Total Protein: 7.6 g/dL (ref 6.0–8.5)

## 2014-08-31 ENCOUNTER — Encounter: Payer: Self-pay | Admitting: *Deleted

## 2014-10-04 ENCOUNTER — Telehealth: Payer: Self-pay | Admitting: Nurse Practitioner

## 2014-10-04 MED ORDER — TRAMADOL HCL 50 MG PO TABS: 50.0000 mg | ORAL_TABLET | Freq: Three times a day (TID) | ORAL | Status: DC

## 2014-10-04 NOTE — Telephone Encounter (Signed)
Informed written Rx at front office ready to be picked up

## 2014-10-04 NOTE — Telephone Encounter (Signed)
Ultram rx ready for pick up  

## 2014-10-08 ENCOUNTER — Ambulatory Visit: Payer: Medicare HMO

## 2014-10-26 ENCOUNTER — Encounter: Payer: Self-pay | Admitting: Physician Assistant

## 2014-10-26 ENCOUNTER — Ambulatory Visit (INDEPENDENT_AMBULATORY_CARE_PROVIDER_SITE_OTHER): Payer: Commercial Managed Care - HMO | Admitting: Physician Assistant

## 2014-10-26 VITALS — BP 142/69 | HR 71 | Temp 97.3°F | Ht 70.0 in | Wt 218.4 lb

## 2014-10-26 DIAGNOSIS — I499 Cardiac arrhythmia, unspecified: Secondary | ICD-10-CM | POA: Diagnosis not present

## 2014-10-26 DIAGNOSIS — J209 Acute bronchitis, unspecified: Secondary | ICD-10-CM | POA: Diagnosis not present

## 2014-10-26 DIAGNOSIS — R05 Cough: Secondary | ICD-10-CM | POA: Diagnosis not present

## 2014-10-26 DIAGNOSIS — R059 Cough, unspecified: Secondary | ICD-10-CM

## 2014-10-26 MED ORDER — LEVOFLOXACIN 500 MG PO TABS: 500.0000 mg | ORAL_TABLET | Freq: Every day | ORAL | Status: DC

## 2014-10-26 MED ORDER — ALBUTEROL SULFATE HFA 108 (90 BASE) MCG/ACT IN AERS: 2.0000 | INHALATION_SPRAY | Freq: Four times a day (QID) | RESPIRATORY_TRACT | Status: DC | PRN

## 2014-10-26 MED ORDER — BENZONATATE 100 MG PO CAPS: 100.0000 mg | ORAL_CAPSULE | Freq: Three times a day (TID) | ORAL | Status: DC | PRN

## 2014-10-26 MED ORDER — CETIRIZINE HCL 10 MG PO TABS: 10.0000 mg | ORAL_TABLET | Freq: Every day | ORAL | Status: DC

## 2014-10-26 NOTE — Patient Instructions (Signed)
While taking Levafloxacin, take Celexa every other day.    Acute Bronchitis Bronchitis is when the airways that extend from the windpipe into the lungs get red, puffy, and painful (inflamed). Bronchitis often causes thick spit (mucus) to develop. This leads to a cough. A cough is the most common symptom of bronchitis. In acute bronchitis, the condition usually begins suddenly and goes away over time (usually in 2 weeks). Smoking, allergies, and asthma can make bronchitis worse. Repeated episodes of bronchitis may cause more lung problems. HOME CARE  Rest.  Drink enough fluids to keep your pee (urine) clear or pale yellow (unless you need to limit fluids as told by your doctor).  Only take over-the-counter or prescription medicines as told by your doctor.  Avoid smoking and secondhand smoke. These can make bronchitis worse. If you are a smoker, think about using nicotine gum or skin patches. Quitting smoking will help your lungs heal faster.  Reduce the chance of getting bronchitis again by:  Washing your hands often.  Avoiding people with cold symptoms.  Trying not to touch your hands to your mouth, nose, or eyes.  Follow up with your doctor as told. GET HELP IF: Your symptoms do not improve after 1 week of treatment. Symptoms include:  Cough.  Fever.  Coughing up thick spit.  Body aches.  Chest congestion.  Chills.  Shortness of breath.  Sore throat. GET HELP RIGHT AWAY IF:   You have an increased fever.  You have chills.  You have severe shortness of breath.  You have bloody thick spit (sputum).  You throw up (vomit) often.  You lose too much body fluid (dehydration).  You have a severe headache.  You faint. MAKE SURE YOU:   Understand these instructions.  Will watch your condition.  Will get help right away if you are not doing well or get worse. Document Released: 12/04/2007 Document Revised: 02/17/2013 Document Reviewed: 12/08/2012 [facility]  Patient Information 2015 [place], [place]. This information is not intended to replace advice given to you by your health care provider. Make sure you discuss any questions you have with your health care provider.

## 2014-10-26 NOTE — Progress Notes (Signed)
   Subjective:    Patient ID: Caitlin Mccarthy, female    DOB: Feb 04, 1936, 79 y.o.   MRN: [id number]  HPI 79 y/o female presents with c/o nonproductive cough x 2 weeks. Has tried Robitussin with no relief.   Review of Systems  Constitutional: Positive for chills and fatigue.  HENT: Positive for congestion (chest and head) and rhinorrhea. Negative for postnasal drip, sneezing and sore throat.   Respiratory: Positive for cough, shortness of breath and wheezing. Negative for stridor.   Cardiovascular: Negative.        Objective:   Physical Exam  Constitutional: She is oriented to person, place, and time. She appears well-developed and well-nourished. No distress.  HENT:  Head: Normocephalic.  Cardiovascular:  Varying rate, sinus rythem  Pulmonary/Chest: Effort normal. No respiratory distress. She has wheezes (expiratory anterior left lobe). She has no rales. She exhibits no tenderness.  Neurological: She is alert and oriented to person, place, and time.  Skin: She is not diaphoretic.  Psychiatric: She has a normal mood and affect. Her behavior is normal. Judgment and thought content normal.  Nursing note and vitals reviewed.         Assessment & Plan:  1. Irregular heart beat  - EKG 12-Lead stated varying sinus rythem, WNL   2. Acute bronchitis, unspecified organism  - levofloxacin (LEVAQUIN) 500 MG tablet; Take 1 tablet (500 mg total) by mouth daily.  Dispense: 7 tablet; Refill: 0 - albuterol (PROVENTIL HFA;VENTOLIN HFA) 108 (90 BASE) MCG/ACT inhaler; Inhale 2 puffs into the lungs every 6 (six) hours as needed for wheezing or shortness of breath.  Dispense: 1 Inhaler; Refill: 1 - cetirizine (ZYRTEC) 10 MG tablet; Take 1 tablet (10 mg total) by mouth daily.  Dispense: 30 tablet; Refill: 11  - Take Celexa qod while taking Levaquin  3. Cough  - benzonatate (TESSALON) 100 MG capsule; Take 1 capsule (100 mg total) by mouth 3 (three) times daily as needed for cough.   Dispense: 21 capsule; Refill: 0   Continue all meds  RTO 2 weeks.   Tiffany A. Benjamin Stain PA-C

## 2014-11-17 ENCOUNTER — Telehealth: Payer: Self-pay | Admitting: Nurse Practitioner

## 2014-11-17 MED ORDER — TRAMADOL HCL 50 MG PO TABS: 50.0000 mg | ORAL_TABLET | Freq: Three times a day (TID) | ORAL | Status: DC

## 2014-11-17 NOTE — Telephone Encounter (Signed)
Ultram rx ready for pick up  

## 2014-11-17 NOTE — Telephone Encounter (Signed)
Pt notified RX for Ultram is ready for pick up

## 2014-11-24 ENCOUNTER — Ambulatory Visit (INDEPENDENT_AMBULATORY_CARE_PROVIDER_SITE_OTHER): Payer: Commercial Managed Care - HMO | Admitting: Nurse Practitioner

## 2014-11-24 ENCOUNTER — Encounter: Payer: Self-pay | Admitting: Nurse Practitioner

## 2014-11-24 VITALS — BP 133/72 | HR 79 | Temp 96.7°F | Ht 70.0 in | Wt 213.0 lb

## 2014-11-24 DIAGNOSIS — M1990 Unspecified osteoarthritis, unspecified site: Secondary | ICD-10-CM

## 2014-11-24 DIAGNOSIS — Z23 Encounter for immunization: Secondary | ICD-10-CM | POA: Diagnosis not present

## 2014-11-24 DIAGNOSIS — F329 Major depressive disorder, single episode, unspecified: Secondary | ICD-10-CM

## 2014-11-24 DIAGNOSIS — E8881 Metabolic syndrome: Secondary | ICD-10-CM | POA: Diagnosis not present

## 2014-11-24 DIAGNOSIS — E785 Hyperlipidemia, unspecified: Secondary | ICD-10-CM | POA: Diagnosis not present

## 2014-11-24 DIAGNOSIS — I10 Essential (primary) hypertension: Secondary | ICD-10-CM | POA: Diagnosis not present

## 2014-11-24 DIAGNOSIS — F32A Depression, unspecified: Secondary | ICD-10-CM

## 2014-11-24 DIAGNOSIS — K219 Gastro-esophageal reflux disease without esophagitis: Secondary | ICD-10-CM | POA: Diagnosis not present

## 2014-11-24 MED ORDER — OMEPRAZOLE 40 MG PO CPDR: 40.0000 mg | DELAYED_RELEASE_CAPSULE | Freq: Every day | ORAL | Status: DC

## 2014-11-24 MED ORDER — HYDROCHLOROTHIAZIDE 25 MG PO TABS: 25.0000 mg | ORAL_TABLET | Freq: Every morning | ORAL | Status: DC

## 2014-11-24 MED ORDER — ROSUVASTATIN CALCIUM 20 MG PO TABS: 20.0000 mg | ORAL_TABLET | Freq: Every day | ORAL | Status: DC

## 2014-11-24 MED ORDER — AMLODIPINE BESY-BENAZEPRIL HCL 10-40 MG PO CAPS: ORAL_CAPSULE | ORAL | Status: DC

## 2014-11-24 MED ORDER — CITALOPRAM HYDROBROMIDE 20 MG PO TABS: 20.0000 mg | ORAL_TABLET | Freq: Every day | ORAL | Status: DC

## 2014-11-24 NOTE — Addendum Note (Signed)
Addended by: Rolena Infante on: 11/24/2014 11:22 AM   Modules accepted: Orders

## 2014-11-24 NOTE — Progress Notes (Signed)
Subjective:    Patient ID: Caitlin Mccarthy, female    DOB: 1936-04-28, 79 y.o.   MRN: [id number]  Patien tere today for follow up of chronic medical problems.   Hypertension This is a chronic problem. The current episode started more than 1 year ago. The problem is unchanged. The problem is controlled. Pertinent negatives include no blurred vision, chest pain, palpitations, peripheral edema or shortness of breath. Risk factors for coronary artery disease include dyslipidemia, family history, obesity, smoking/tobacco exposure, post-menopausal state and sedentary lifestyle. Past treatments include calcium channel blockers, ACE inhibitors and diuretics. The current treatment provides moderate improvement. Hypertensive end-organ damage includes CAD/MI.  Hyperlipidemia This is a chronic problem. The current episode started more than 1 year ago. The problem is uncontrolled. Recent lipid tests were reviewed and are high. Exacerbating diseases include obesity. She has no history of diabetes or hypothyroidism. Pertinent negatives include no chest pain, myalgias or shortness of breath. Current antihyperlipidemic treatment includes statins. The current treatment provides moderate improvement of lipids. Compliance problems include adherence to diet and adherence to exercise.  Risk factors for coronary artery disease include dyslipidemia, hypertension, obesity, post-menopausal and a sedentary lifestyle.  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 GERD Omeprazole daily keeps symptoms under control Metabolic syndrome Does not check blood sugars at home. Tries to watch carb intake    Review of Systems  HENT: Negative.   Eyes: Negative.  Negative for blurred vision.  Respiratory: Negative.  Negative for shortness of breath.   Cardiovascular: Negative.  Negative for chest pain, palpitations and leg swelling.  Gastrointestinal: Negative.    Endocrine: Positive for polydipsia. Negative for polyphagia. Polyuria: no more than usual with diuretic.   Genitourinary: Negative.        Denies urinary incontinence  Musculoskeletal: Negative for myalgias.  Skin: Negative.   Neurological: Negative.   Psychiatric/Behavioral: Negative.   All other systems reviewed and are negative.      Objective:   Physical Exam  Constitutional: She is oriented to person, place, and time. She appears well-developed and well-nourished.  HENT:  Nose: Nose normal.  Mouth/Throat: Oropharynx is clear and moist.  Eyes: EOM are normal.  Neck: Trachea normal, normal range of motion and full passive range of motion without pain. Neck supple. No JVD present. Carotid bruit is not present. No thyromegaly present.  Cardiovascular: Normal rate, regular rhythm, normal heart sounds and intact distal pulses.  Exam reveals no gallop and no friction rub.   No murmur heard. Pulmonary/Chest: Effort normal and breath sounds normal. No respiratory distress.  Abdominal: Soft. Bowel sounds are normal. She exhibits no distension and no mass. There is no tenderness.  Musculoskeletal: Normal range of motion. She exhibits no edema.  Lymphadenopathy:    She has no cervical adenopathy.  Neurological: She is alert and oriented to person, place, and time. She has normal reflexes.  Skin: Skin is warm and dry.  Psychiatric: She has a normal mood and affect. Her behavior is normal. Judgment and thought content normal.   BP 133/72 mmHg  Pulse 79  Temp(Src) 96.7 F (35.9 C) (Oral)  Ht '5\' 10"'  (1.778 m)  Wt 213 lb (96.616 kg)  BMI 30.56 kg/m2       Assessment & Plan:  1. Essential hypertension, benign Do not add salt to diet - amLODipine-benazepril (LOTREL) 10-40 MG per capsule; TAKE ONE CAPSULE EACH MORNING  Dispense: 30 capsule; Refill: 5 - CMP14+EGFR  2. Gastroesophageal reflux disease without esophagitis Avoid spicy foods Do not eat 2 hours prior to bedtime -  omeprazole (PRILOSEC) 40 MG capsule; Take 1 capsule (40 mg total) by mouth daily.  Dispense: 30 capsule; Refill: 5  3. Arthritis - hydrochlorothiazide (HYDRODIURIL) 25 MG tablet; Take 1 tablet (25 mg total) by mouth every morning.  Dispense: 30 tablet; Refill: 5  4. Metabolic syndrome Watch carbs in diet  5. Hyperlipidemia with target LDL less than 100 Low fta diet - rosuvastatin (CRESTOR) 20 MG tablet; Take 1 tablet (20 mg total) by mouth at bedtime.  Dispense: 30 tablet; Refill: 5 - NMR, lipoprofile  6. Depression Stress management - citalopram (CELEXA) 20 MG tablet; Take 1 tablet (20 mg total) by mouth daily.  Dispense: 30 tablet; Refill: 5    Labs pending Health maintenance reviewed Diet and exercise encouraged Continue all meds Follow up  In 3 month   [place], FNP

## 2014-11-24 NOTE — Patient Instructions (Signed)
Bone Health Our bones do many things. They provide structure, protect organs, anchor muscles, and store calcium. Adequate calcium in your diet and weight-bearing physical activity help build strong bones, improve bone amounts, and may reduce the risk of weakening of bones (osteoporosis) later in life. PEAK BONE MASS By age 77, the average woman has acquired most of her skeletal bone mass. A large decline occurs in older adults which increases the risk of osteoporosis. In women this occurs around the time of menopause. It is important for young girls to reach their peak bone mass in order to maintain bone health throughout life. A person with high bone mass as a young adult will be more likely to have a higher bone mass later in life. Not enough calcium consumption and physical activity early on could result in a failure to achieve optimum bone mass in adulthood. OSTEOPOROSIS Osteoporosis is a disease of the bones. It is defined as low bone mass with deterioration of bone structure. Osteoporosis leads to an increase risk of fractures with falls. These fractures commonly happen in the wrist, hip, and spine. While men and women of all ages and background can develop osteoporosis, some of the risk factors for osteoporosis are:  Female.  White.  Postmenopausal.  Older adults.  Small in body size.  Eating a diet low in calcium.  Physically inactive.  Smoking.  Use of some medications.  Family history. CALCIUM Calcium is a mineral needed by the body for healthy bones, teeth, and proper function of the heart, muscles, and nerves. The body cannot produce calcium so it must be absorbed through food. Good sources of calcium include:  Dairy products (low fat or nonfat milk, cheese, and yogurt).  Dark green leafy vegetables (bok choy and broccoli).  Calcium fortified foods (orange juice, cereal, bread, soy beverages, and tofu products).  Nuts (almonds). Recommended amounts of calcium vary  for individuals. RECOMMENDED CALCIUM INTAKES Age and Amount in mg per day  Children 1 to 3 years / 700 mg  Children 4 to 8 years / 1,000 mg  Children 9 to 13 years / 1,300 mg  Teens 14 to 18 years / 1,300 mg  Adults 19 to 50 years / 1,000 mg  Adult women 51 to 70 years / 1,200 mg  Adults 71 years and older / 1,200 mg  Pregnant and breastfeeding teens / 1,300 mg  Pregnant and breastfeeding adults / 1,000 mg Vitamin D also plays an important role in healthy bone development. Vitamin D helps in the absorption of calcium. WEIGHT-BEARING PHYSICAL ACTIVITY Regular physical activity has many positive health benefits. Benefits include strong bones. Weight-bearing physical activity early in life is important in reaching peak bone mass. Weight-bearing physical activities cause muscles and bones to work against gravity. Some examples of weight bearing physical activities include:  Walking, jogging, or running.  Field Hockey.  Jumping rope.  Dancing.  Soccer.  Tennis or Racquetball.  Stair climbing.  Basketball.  Hiking.  Weight lifting.  Aerobic fitness classes. Including weight-bearing physical activity into an exercise plan is a great way to keep bones healthy. Adults: Engage in at least 30 minutes of moderate physical activity on most, preferably all, days of the week. Children: Engage in at least 60 minutes of moderate physical activity on most, preferably all, days of the week. FOR MORE INFORMATION [place] Department of Agriculture, Center for Nutrition Policy and Promotion: www.cnpp.usda.gov National Osteoporosis Foundation: www.nof.org Document Released: 09/07/2003 Document Revised: 10/12/2012 Document Reviewed: 12/07/2008 ExitCare Patient Information   2015 [employer]. This information is not intended to replace advice given to you by your health care provider. Make sure you discuss any questions you have with your health care provider.  

## 2014-11-25 LAB — CMP14+EGFR
A/G RATIO: 1.2 (ref 1.1–2.5)
ALT: 29 IU/L (ref 0–32)
AST: 35 IU/L (ref 0–40)
Albumin: 4.4 g/dL (ref 3.5–4.8)
Alkaline Phosphatase: 62 IU/L (ref 39–117)
BUN/Creatinine Ratio: 27 — ABNORMAL HIGH (ref 11–26)
BUN: 16 mg/dL (ref 8–27)
Bilirubin Total: 0.5 mg/dL (ref 0.0–1.2)
CHLORIDE: 96 mmol/L — AB (ref 97–108)
CO2: 27 mmol/L (ref 18–29)
CREATININE: 0.59 mg/dL (ref 0.57–1.00)
Calcium: 11.5 mg/dL — ABNORMAL HIGH (ref 8.7–10.3)
GFR calc non Af Amer: 89 mL/min/{1.73_m2} (ref >59)
GFR, EST AFRICAN AMERICAN: 103 mL/min/{1.73_m2} (ref >59)
Globulin, Total: 3.7 g/dL (ref 1.5–4.5)
Glucose: 127 mg/dL — ABNORMAL HIGH (ref 65–99)
Potassium: 4.2 mmol/L (ref 3.5–5.2)
SODIUM: 137 mmol/L (ref 134–144)
Total Protein: 8.1 g/dL (ref 6.0–8.5)

## 2014-11-25 LAB — NMR, LIPOPROFILE
CHOLESTEROL: 232 mg/dL — AB (ref 100–199)
HDL CHOLESTEROL BY NMR: 42 mg/dL (ref >39)
HDL PARTICLE NUMBER: 28.5 umol/L — AB (ref >=30.5)
LDL PARTICLE NUMBER: 2150 nmol/L — AB (ref <1000)
LDL Size: 20.5 nm (ref >20.5)
LDL-C: 144 mg/dL — ABNORMAL HIGH (ref 0–99)
LP-IR SCORE: 84 — AB (ref <=45)
Small LDL Particle Number: 1257 nmol/L — ABNORMAL HIGH (ref <=527)
Triglycerides by NMR: 229 mg/dL — ABNORMAL HIGH (ref 0–149)

## 2014-11-29 DIAGNOSIS — L821 Other seborrheic keratosis: Secondary | ICD-10-CM | POA: Diagnosis not present

## 2015-01-12 ENCOUNTER — Telehealth: Payer: Self-pay | Admitting: Nurse Practitioner

## 2015-01-12 MED ORDER — TRAMADOL HCL 50 MG PO TABS: 50.0000 mg | ORAL_TABLET | Freq: Three times a day (TID) | ORAL | Status: DC

## 2015-01-12 NOTE — Telephone Encounter (Signed)
Aware , tramadol script ready. 

## 2015-01-12 NOTE — Telephone Encounter (Signed)
Tramadol rx ready for pick up

## 2015-02-24 ENCOUNTER — Ambulatory Visit (INDEPENDENT_AMBULATORY_CARE_PROVIDER_SITE_OTHER): Payer: Commercial Managed Care - HMO | Admitting: Nurse Practitioner

## 2015-02-24 ENCOUNTER — Encounter: Payer: Self-pay | Admitting: Nurse Practitioner

## 2015-02-24 VITALS — BP 125/63 | HR 73 | Temp 97.2°F | Ht 70.0 in | Wt 220.6 lb

## 2015-02-24 DIAGNOSIS — Z6831 Body mass index (BMI) 31.0-31.9, adult: Secondary | ICD-10-CM | POA: Diagnosis not present

## 2015-02-24 DIAGNOSIS — F329 Major depressive disorder, single episode, unspecified: Secondary | ICD-10-CM | POA: Diagnosis not present

## 2015-02-24 DIAGNOSIS — K219 Gastro-esophageal reflux disease without esophagitis: Secondary | ICD-10-CM | POA: Diagnosis not present

## 2015-02-24 DIAGNOSIS — M1990 Unspecified osteoarthritis, unspecified site: Secondary | ICD-10-CM

## 2015-02-24 DIAGNOSIS — E785 Hyperlipidemia, unspecified: Secondary | ICD-10-CM | POA: Diagnosis not present

## 2015-02-24 DIAGNOSIS — E8881 Metabolic syndrome: Secondary | ICD-10-CM

## 2015-02-24 DIAGNOSIS — M160 Bilateral primary osteoarthritis of hip: Secondary | ICD-10-CM

## 2015-02-24 DIAGNOSIS — I10 Essential (primary) hypertension: Secondary | ICD-10-CM

## 2015-02-24 DIAGNOSIS — F32A Depression, unspecified: Secondary | ICD-10-CM

## 2015-02-24 MED ORDER — AMLODIPINE BESY-BENAZEPRIL HCL 10-40 MG PO CAPS: ORAL_CAPSULE | ORAL | Status: DC

## 2015-02-24 MED ORDER — OMEPRAZOLE 40 MG PO CPDR: 40.0000 mg | DELAYED_RELEASE_CAPSULE | Freq: Every day | ORAL | Status: DC

## 2015-02-24 MED ORDER — ROSUVASTATIN CALCIUM 20 MG PO TABS: 20.0000 mg | ORAL_TABLET | Freq: Every day | ORAL | Status: DC

## 2015-02-24 MED ORDER — HYDROCHLOROTHIAZIDE 25 MG PO TABS: 25.0000 mg | ORAL_TABLET | Freq: Every morning | ORAL | Status: DC

## 2015-02-24 NOTE — Patient Instructions (Signed)
Fat and Cholesterol Control Diet Fat and cholesterol levels in your blood and organs are influenced by your diet. High levels of fat and cholesterol may lead to diseases of the heart, small and large blood vessels, gallbladder, liver, and pancreas. CONTROLLING FAT AND CHOLESTEROL WITH DIET Although exercise and lifestyle factors are important, your diet is key. That is because certain foods are known to raise cholesterol and others to lower it. The goal is to balance foods for their effect on cholesterol and more importantly, to replace saturated and trans fat with other types of fat, such as monounsaturated fat, polyunsaturated fat, and omega-3 fatty acids. On average, a person should consume no more than 15 to 17 g of saturated fat daily. Saturated and trans fats are considered "bad" fats, and they will raise LDL cholesterol. Saturated fats are primarily found in animal products such as meats, butter, and cream. However, that does not mean you need to give up all your favorite foods. Today, there are good tasting, low-fat, low-cholesterol substitutes for most of the things you like to eat. Choose low-fat or nonfat alternatives. Choose round or loin cuts of red meat. These types of cuts are lowest in fat and cholesterol. Chicken (without the skin), fish, veal, and ground turkey breast are great choices. Eliminate fatty meats, such as hot dogs and salami. Even shellfish have little or no saturated fat. Have a 3 oz (85 g) portion when you eat lean meat, poultry, or fish. Trans fats are also called "partially hydrogenated oils." They are oils that have been scientifically manipulated so that they are solid at room temperature resulting in a longer shelf life and improved taste and texture of foods in which they are added. Trans fats are found in stick margarine, some tub margarines, cookies, crackers, and baked goods.  When baking and cooking, oils are a great substitute for butter. The monounsaturated oils are  especially beneficial since it is believed they lower LDL and raise HDL. The oils you should avoid entirely are saturated tropical oils, such as coconut and palm.  Remember to eat a lot from food groups that are naturally free of saturated and trans fat, including fish, fruit, vegetables, beans, grains (barley, rice, couscous, bulgur wheat), and pasta (without cream sauces).  IDENTIFYING FOODS THAT LOWER FAT AND CHOLESTEROL  Soluble fiber may lower your cholesterol. This type of fiber is found in fruits such as apples, vegetables such as broccoli, potatoes, and carrots, legumes such as beans, peas, and lentils, and grains such as barley. Foods fortified with plant sterols (phytosterol) may also lower cholesterol. You should eat at least 2 g per day of these foods for a cholesterol lowering effect.  Read package labels to identify low-saturated fats, trans fat free, and low-fat foods at the supermarket. Select cheeses that have only 2 to 3 g saturated fat per ounce. Use a heart-healthy tub margarine that is free of trans fats or partially hydrogenated oil. When buying baked goods (cookies, crackers), avoid partially hydrogenated oils. Breads and muffins should be made from whole grains (whole-wheat or whole oat flour, instead of "flour" or "enriched flour"). Buy non-creamy canned soups with reduced salt and no added fats.  FOOD PREPARATION TECHNIQUES  Never deep-fry. If you must fry, either stir-fry, which uses very little fat, or use non-stick cooking sprays. When possible, broil, bake, or roast meats, and steam vegetables. Instead of putting butter or margarine on vegetables, use lemon and herbs, applesauce, and cinnamon (for squash and sweet potatoes). Use nonfat   yogurt, salsa, and low-fat dressings for salads.  LOW-SATURATED FAT / LOW-FAT FOOD SUBSTITUTES Meats / Saturated Fat (g)  Avoid: Steak, marbled (3 oz/85 g) / 11 g  Choose: Steak, lean (3 oz/85 g) / 4 g  Avoid: Hamburger (3 oz/85 g) / 7  g  Choose: Hamburger, lean (3 oz/85 g) / 5 g  Avoid: Ham (3 oz/85 g) / 6 g  Choose: Ham, lean cut (3 oz/85 g) / 2.4 g  Avoid: Chicken, with skin, dark meat (3 oz/85 g) / 4 g  Choose: Chicken, skin removed, dark meat (3 oz/85 g) / 2 g  Avoid: Chicken, with skin, light meat (3 oz/85 g) / 2.5 g  Choose: Chicken, skin removed, light meat (3 oz/85 g) / 1 g Dairy / Saturated Fat (g)  Avoid: Whole milk (1 cup) / 5 g  Choose: Low-fat milk, 2% (1 cup) / 3 g  Choose: Low-fat milk, 1% (1 cup) / 1.5 g  Choose: Skim milk (1 cup) / 0.3 g  Avoid: Hard cheese (1 oz/28 g) / 6 g  Choose: Skim milk cheese (1 oz/28 g) / 2 to 3 g  Avoid: Cottage cheese, 4% fat (1 cup) / 6.5 g  Choose: Low-fat cottage cheese, 1% fat (1 cup) / 1.5 g  Avoid: Ice cream (1 cup) / 9 g  Choose: Sherbet (1 cup) / 2.5 g  Choose: Nonfat frozen yogurt (1 cup) / 0.3 g  Choose: Frozen fruit bar / trace  Avoid: Whipped cream (1 tbs) / 3.5 g  Choose: Nondairy whipped topping (1 tbs) / 1 g Condiments / Saturated Fat (g)  Avoid: Mayonnaise (1 tbs) / 2 g  Choose: Low-fat mayonnaise (1 tbs) / 1 g  Avoid: Butter (1 tbs) / 7 g  Choose: Extra light margarine (1 tbs) / 1 g  Avoid: Coconut oil (1 tbs) / 11.8 g  Choose: Olive oil (1 tbs) / 1.8 g  Choose: Corn oil (1 tbs) / 1.7 g  Choose: Safflower oil (1 tbs) / 1.2 g  Choose: Sunflower oil (1 tbs) / 1.4 g  Choose: Soybean oil (1 tbs) / 2.4 g  Choose: Canola oil (1 tbs) / 1 g Document Released: 06/17/2005 Document Revised: 10/12/2012 Document Reviewed: 09/15/2013 ExitCare Patient Information 2015 [employer]. This information is not intended to replace advice given to you by your health care provider. Make sure you discuss any questions you have with your health care provider.  

## 2015-02-24 NOTE — Progress Notes (Signed)
Subjective:    Patient ID: Caitlin Mccarthy, female    DOB: January 24, 1936, 79 y.o.   MRN: [id number]  Patien tere today for follow up of chronic medical problems.   Hypertension This is a chronic problem. The current episode started more than 1 year ago. The problem is unchanged. The problem is controlled. Pertinent negatives include no blurred vision, chest pain, palpitations, peripheral edema or shortness of breath. Risk factors for coronary artery disease include dyslipidemia, family history, obesity, smoking/tobacco exposure, post-menopausal state and sedentary lifestyle. Past treatments include calcium channel blockers, ACE inhibitors and diuretics. The current treatment provides moderate improvement. Hypertensive end-organ damage includes CAD/MI.  Hyperlipidemia This is a chronic problem. The current episode started more than 1 year ago. The problem is uncontrolled. Recent lipid tests were reviewed and are high. Exacerbating diseases include obesity. She has no history of diabetes or hypothyroidism. Pertinent negatives include no chest pain, myalgias or shortness of breath. Current antihyperlipidemic treatment includes statins. The current treatment provides moderate improvement of lipids. Compliance problems include adherence to diet and adherence to exercise.  Risk factors for coronary artery disease include dyslipidemia, hypertension, obesity, post-menopausal and a sedentary lifestyle.  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 GERD Omeprazole daily keeps symptoms under control Metabolic syndrome Does not check blood sugars at home. Tries to watch carb intake    Review of Systems  HENT: Negative.   Eyes: Negative.  Negative for blurred vision.  Respiratory: Negative.  Negative for shortness of breath.   Cardiovascular: Negative.  Negative for chest pain, palpitations and leg swelling.  Gastrointestinal: Negative.    Endocrine: Positive for polydipsia. Negative for polyphagia. Polyuria: no more than usual with diuretic.   Genitourinary: Negative.        Denies urinary incontinence  Musculoskeletal: Negative for myalgias.  Skin: Negative.   Neurological: Negative.   Psychiatric/Behavioral: Negative.   All other systems reviewed and are negative.      Objective:   Physical Exam  Constitutional: She is oriented to person, place, and time. She appears well-developed and well-nourished.  HENT:  Nose: Nose normal.  Mouth/Throat: Oropharynx is clear and moist.  Eyes: EOM are normal.  Neck: Trachea normal, normal range of motion and full passive range of motion without pain. Neck supple. No JVD present. Carotid bruit is not present. No thyromegaly present.  Cardiovascular: Normal rate, regular rhythm, normal heart sounds and intact distal pulses.  Exam reveals no gallop and no friction rub.   No murmur heard. Pulmonary/Chest: Effort normal and breath sounds normal. No respiratory distress.  Abdominal: Soft. Bowel sounds are normal. She exhibits no distension and no mass. There is no tenderness.  Musculoskeletal: Normal range of motion. She exhibits no edema.  Lymphadenopathy:    She has no cervical adenopathy.  Neurological: She is alert and oriented to person, place, and time. She has normal reflexes.  Skin: Skin is warm and dry.  Psychiatric: She has a normal mood and affect. Her behavior is normal. Judgment and thought content normal.   BP 125/63 mmHg  Pulse 73  Temp(Src) 97.2 F (36.2 C) (Oral)  Ht '5\' 10"'  (1.778 m)  Wt 220 lb 9.6 oz (100.064 kg)  BMI 31.65 kg/m2       Assessment & Plan:   1. Essential hypertension, benign Do not add salt to diet - amLODipine-benazepril (LOTREL) 10-40 MG per capsule; TAKE ONE CAPSULE EACH MORNING  Dispense: 30 capsule; Refill: 5 -  CMP14+EGFR  2. Gastroesophageal reflux disease without esophagitis Avoid spicy foods Do not eat 2 hours prior to  bedtime - omeprazole (PRILOSEC) 40 MG capsule; Take 1 capsule (40 mg total) by mouth daily.  Dispense: 30 capsule; Refill: 5  3. Primary osteoarthritis of both hips Referral to rheumatologist  4. Hyperlipidemia with target LDL less than 100 Low fat diet - rosuvastatin (CRESTOR) 20 MG tablet; Take 1 tablet (20 mg total) by mouth at bedtime.  Dispense: 30 tablet; Refill: 5 - Lipid panel  5. Depression Stress management  6. Metabolic syndrome - watch carbs in diet  7. BMI 31.0-31.9,adult Discussed diet and exercise for person with BMI >25 Will recheck weight in 3-6 months   8. Arthritis - hydrochlorothiazide (HYDRODIURIL) 25 MG tablet; Take 1 tablet (25 mg total) by mouth every morning.  Dispense: 30 tablet; Refill: 5 - Ambulatory referral to Rheumatology    Labs pending Health maintenance reviewed Diet and exercise encouraged Continue all meds Follow up  In 3 month   [place], FNP

## 2015-02-25 ENCOUNTER — Other Ambulatory Visit: Payer: Self-pay | Admitting: Nurse Practitioner

## 2015-02-25 LAB — CMP14+EGFR
ALT: 29 IU/L (ref 0–32)
AST: 28 IU/L (ref 0–40)
Albumin/Globulin Ratio: 1.6 (ref 1.1–2.5)
Albumin: 4.7 g/dL (ref 3.5–4.8)
Alkaline Phosphatase: 60 IU/L (ref 39–117)
BUN/Creatinine Ratio: 25 (ref 11–26)
BUN: 17 mg/dL (ref 8–27)
Bilirubin Total: 0.3 mg/dL (ref 0.0–1.2)
CO2: 26 mmol/L (ref 18–29)
Calcium: 11.1 mg/dL — ABNORMAL HIGH (ref 8.7–10.3)
Chloride: 99 mmol/L (ref 97–108)
Creatinine, Ser: 0.67 mg/dL (ref 0.57–1.00)
GFR, EST AFRICAN AMERICAN: 99 mL/min/{1.73_m2} (ref >59)
GFR, EST NON AFRICAN AMERICAN: 86 mL/min/{1.73_m2} (ref >59)
GLUCOSE: 130 mg/dL — AB (ref 65–99)
Globulin, Total: 3 g/dL (ref 1.5–4.5)
Potassium: 4.6 mmol/L (ref 3.5–5.2)
Sodium: 139 mmol/L (ref 134–144)
TOTAL PROTEIN: 7.7 g/dL (ref 6.0–8.5)

## 2015-02-25 LAB — LIPID PANEL
CHOL/HDL RATIO: 6.5 ratio — AB (ref 0.0–4.4)
Cholesterol, Total: 252 mg/dL — ABNORMAL HIGH (ref 100–199)
HDL: 39 mg/dL — ABNORMAL LOW (ref >39)
LDL Calculated: 148 mg/dL — ABNORMAL HIGH (ref 0–99)
Triglycerides: 326 mg/dL — ABNORMAL HIGH (ref 0–149)
VLDL CHOLESTEROL CAL: 65 mg/dL — AB (ref 5–40)

## 2015-02-25 MED ORDER — FENOFIBRATE MICRONIZED 130 MG PO CAPS: 130.0000 mg | ORAL_CAPSULE | Freq: Every day | ORAL | Status: DC

## 2015-03-01 ENCOUNTER — Telehealth: Payer: Self-pay | Admitting: Nurse Practitioner

## 2015-03-01 DIAGNOSIS — C44729 Squamous cell carcinoma of skin of left lower limb, including hip: Secondary | ICD-10-CM | POA: Diagnosis not present

## 2015-03-01 MED ORDER — TRAMADOL HCL 50 MG PO TABS: 50.0000 mg | ORAL_TABLET | Freq: Three times a day (TID) | ORAL | Status: DC

## 2015-03-01 NOTE — Telephone Encounter (Signed)
Ultram rx ready for pick up  

## 2015-03-30 NOTE — Telephone Encounter (Signed)
RX put at the front 03-01-15

## 2015-04-13 ENCOUNTER — Emergency Department (HOSPITAL_COMMUNITY): Payer: Commercial Managed Care - HMO

## 2015-04-13 ENCOUNTER — Inpatient Hospital Stay (HOSPITAL_COMMUNITY)
Admission: EM | Admit: 2015-04-13 | Discharge: 2015-04-17 | DRG: 871 | Disposition: A | Discharge status: Home Health | Payer: Commercial Managed Care - HMO | Attending: Internal Medicine | Admitting: Internal Medicine

## 2015-04-13 ENCOUNTER — Encounter (HOSPITAL_COMMUNITY): Payer: Self-pay

## 2015-04-13 DIAGNOSIS — B961 Klebsiella pneumoniae [K. pneumoniae] as the cause of diseases classified elsewhere: Secondary | ICD-10-CM | POA: Diagnosis present

## 2015-04-13 DIAGNOSIS — I129 Hypertensive chronic kidney disease with stage 1 through stage 4 chronic kidney disease, or unspecified chronic kidney disease: Secondary | ICD-10-CM | POA: Diagnosis not present

## 2015-04-13 DIAGNOSIS — E669 Obesity, unspecified: Secondary | ICD-10-CM | POA: Diagnosis present

## 2015-04-13 DIAGNOSIS — E860 Dehydration: Secondary | ICD-10-CM | POA: Diagnosis not present

## 2015-04-13 DIAGNOSIS — E785 Hyperlipidemia, unspecified: Secondary | ICD-10-CM | POA: Diagnosis present

## 2015-04-13 DIAGNOSIS — E876 Hypokalemia: Secondary | ICD-10-CM | POA: Diagnosis present

## 2015-04-13 DIAGNOSIS — N182 Chronic kidney disease, stage 2 (mild): Secondary | ICD-10-CM | POA: Diagnosis present

## 2015-04-13 DIAGNOSIS — G9340 Encephalopathy, unspecified: Secondary | ICD-10-CM | POA: Diagnosis not present

## 2015-04-13 DIAGNOSIS — R296 Repeated falls: Secondary | ICD-10-CM | POA: Diagnosis not present

## 2015-04-13 DIAGNOSIS — Z96641 Presence of right artificial hip joint: Secondary | ICD-10-CM | POA: Diagnosis present

## 2015-04-13 DIAGNOSIS — Z23 Encounter for immunization: Secondary | ICD-10-CM | POA: Diagnosis not present

## 2015-04-13 DIAGNOSIS — R404 Transient alteration of awareness: Secondary | ICD-10-CM | POA: Diagnosis not present

## 2015-04-13 DIAGNOSIS — F17210 Nicotine dependence, cigarettes, uncomplicated: Secondary | ICD-10-CM | POA: Diagnosis not present

## 2015-04-13 DIAGNOSIS — Z7982 Long term (current) use of aspirin: Secondary | ICD-10-CM | POA: Diagnosis not present

## 2015-04-13 DIAGNOSIS — G8929 Other chronic pain: Secondary | ICD-10-CM | POA: Diagnosis not present

## 2015-04-13 DIAGNOSIS — R748 Abnormal levels of other serum enzymes: Secondary | ICD-10-CM | POA: Diagnosis present

## 2015-04-13 DIAGNOSIS — M1990 Unspecified osteoarthritis, unspecified site: Secondary | ICD-10-CM | POA: Diagnosis present

## 2015-04-13 DIAGNOSIS — A4150 Gram-negative sepsis, unspecified: Secondary | ICD-10-CM | POA: Diagnosis present

## 2015-04-13 DIAGNOSIS — N132 Hydronephrosis with renal and ureteral calculous obstruction: Secondary | ICD-10-CM | POA: Diagnosis present

## 2015-04-13 DIAGNOSIS — I10 Essential (primary) hypertension: Secondary | ICD-10-CM | POA: Diagnosis present

## 2015-04-13 DIAGNOSIS — K219 Gastro-esophageal reflux disease without esophagitis: Secondary | ICD-10-CM | POA: Diagnosis present

## 2015-04-13 DIAGNOSIS — N390 Urinary tract infection, site not specified: Secondary | ICD-10-CM | POA: Diagnosis present

## 2015-04-13 DIAGNOSIS — R1084 Generalized abdominal pain: Secondary | ICD-10-CM | POA: Diagnosis not present

## 2015-04-13 DIAGNOSIS — N135 Crossing vessel and stricture of ureter without hydronephrosis: Secondary | ICD-10-CM | POA: Diagnosis not present

## 2015-04-13 DIAGNOSIS — R739 Hyperglycemia, unspecified: Secondary | ICD-10-CM | POA: Diagnosis not present

## 2015-04-13 DIAGNOSIS — R531 Weakness: Secondary | ICD-10-CM | POA: Diagnosis not present

## 2015-04-13 DIAGNOSIS — E7800 Pure hypercholesterolemia, unspecified: Secondary | ICD-10-CM | POA: Diagnosis present

## 2015-04-13 DIAGNOSIS — Z87442 Personal history of urinary calculi: Secondary | ICD-10-CM | POA: Diagnosis not present

## 2015-04-13 DIAGNOSIS — N10 Acute tubulo-interstitial nephritis: Secondary | ICD-10-CM | POA: Diagnosis not present

## 2015-04-13 DIAGNOSIS — A419 Sepsis, unspecified organism: Secondary | ICD-10-CM | POA: Diagnosis present

## 2015-04-13 DIAGNOSIS — R4182 Altered mental status, unspecified: Secondary | ICD-10-CM | POA: Diagnosis not present

## 2015-04-13 DIAGNOSIS — R112 Nausea with vomiting, unspecified: Secondary | ICD-10-CM | POA: Diagnosis not present

## 2015-04-13 DIAGNOSIS — G894 Chronic pain syndrome: Secondary | ICD-10-CM | POA: Diagnosis present

## 2015-04-13 DIAGNOSIS — Z8249 Family history of ischemic heart disease and other diseases of the circulatory system: Secondary | ICD-10-CM

## 2015-04-13 DIAGNOSIS — R110 Nausea: Secondary | ICD-10-CM | POA: Diagnosis not present

## 2015-04-13 DIAGNOSIS — A4159 Other Gram-negative sepsis: Secondary | ICD-10-CM | POA: Diagnosis not present

## 2015-04-13 DIAGNOSIS — M25551 Pain in right hip: Secondary | ICD-10-CM | POA: Diagnosis not present

## 2015-04-13 LAB — CBC WITH DIFFERENTIAL/PLATELET
BASOS ABS: 0 10*3/uL (ref 0.0–0.1)
Basophils Relative: 0 %
EOS PCT: 0 %
Eosinophils Absolute: 0 10*3/uL (ref 0.0–0.7)
HEMATOCRIT: 38.6 % (ref 36.0–46.0)
Hemoglobin: 13.1 g/dL (ref 12.0–15.0)
LYMPHS ABS: 1 10*3/uL (ref 0.7–4.0)
LYMPHS PCT: 6 %
MCH: 31.5 pg (ref 26.0–34.0)
MCHC: 33.9 g/dL (ref 30.0–36.0)
MCV: 92.8 fL (ref 78.0–100.0)
Monocytes Absolute: 0.9 10*3/uL (ref 0.1–1.0)
Monocytes Relative: 6 %
NEUTROS ABS: 14 10*3/uL — AB (ref 1.7–7.7)
Neutrophils Relative %: 88 %
PLATELETS: 203 10*3/uL (ref 150–400)
RBC: 4.16 MIL/uL (ref 3.87–5.11)
RDW: 12.7 % (ref 11.5–15.5)
WBC: 15.9 10*3/uL — AB (ref 4.0–10.5)

## 2015-04-13 LAB — URINE MICROSCOPIC-ADD ON

## 2015-04-13 LAB — COMPREHENSIVE METABOLIC PANEL
ALT: 24 U/L (ref 14–54)
AST: 32 U/L (ref 15–41)
Albumin: 3.5 g/dL (ref 3.5–5.0)
Alkaline Phosphatase: 52 U/L (ref 38–126)
Anion gap: 8 (ref 5–15)
BUN: 24 mg/dL — AB (ref 6–20)
CHLORIDE: 100 mmol/L — AB (ref 101–111)
CO2: 26 mmol/L (ref 22–32)
CREATININE: 1.11 mg/dL — AB (ref 0.44–1.00)
Calcium: 10.5 mg/dL — ABNORMAL HIGH (ref 8.9–10.3)
GFR calc non Af Amer: 47 mL/min — ABNORMAL LOW (ref >60)
GFR, EST AFRICAN AMERICAN: 54 mL/min — AB (ref >60)
Glucose, Bld: 203 mg/dL — ABNORMAL HIGH (ref 65–99)
POTASSIUM: 3.8 mmol/L (ref 3.5–5.1)
Sodium: 134 mmol/L — ABNORMAL LOW (ref 135–145)
Total Bilirubin: 1.1 mg/dL (ref 0.3–1.2)
Total Protein: 7.9 g/dL (ref 6.5–8.1)

## 2015-04-13 LAB — URINALYSIS, ROUTINE W REFLEX MICROSCOPIC
BILIRUBIN URINE: NEGATIVE
GLUCOSE, UA: NEGATIVE mg/dL
Ketones, ur: NEGATIVE mg/dL
NITRITE: POSITIVE — AB
PH: 6 (ref 5.0–8.0)
Protein, ur: 100 mg/dL — AB
SPECIFIC GRAVITY, URINE: 1.02 (ref 1.005–1.030)
Urobilinogen, UA: 0.2 mg/dL (ref 0.0–1.0)

## 2015-04-13 LAB — LACTIC ACID, PLASMA
Lactic Acid, Venous: 1.9 mmol/L (ref 0.5–2.0)
Lactic Acid, Venous: 2.1 mmol/L (ref 0.5–2.0)

## 2015-04-13 LAB — TROPONIN I
TROPONIN I: 0.2 ng/mL — AB (ref <0.031)
Troponin I: 0.13 ng/mL — ABNORMAL HIGH (ref <0.031)

## 2015-04-13 LAB — LIPASE, BLOOD: Lipase: 23 U/L (ref 22–51)

## 2015-04-13 MED ORDER — IOHEXOL 300 MG/ML  SOLN
100.0000 mL | Freq: Once | INTRAMUSCULAR | Status: AC | PRN
  Administered 2015-04-13: 100 mL via INTRAVENOUS

## 2015-04-13 MED ORDER — TAMSULOSIN HCL 0.4 MG PO CAPS
0.4000 mg | ORAL_CAPSULE | Freq: Every day | ORAL | Status: DC
  Administered 2015-04-13 – 2015-04-17 (×4): 0.4 mg via ORAL
  Filled 2015-04-13 (×4): qty 1

## 2015-04-13 MED ORDER — DEXTROSE 5 % IV SOLN
1.0000 g | Freq: Once | INTRAVENOUS | Status: AC
  Administered 2015-04-13: 1 g via INTRAVENOUS
  Filled 2015-04-13: qty 10

## 2015-04-13 MED ORDER — FENOFIBRATE 160 MG PO TABS
160.0000 mg | ORAL_TABLET | Freq: Every day | ORAL | Status: DC
  Administered 2015-04-13 – 2015-04-17 (×4): 160 mg via ORAL
  Filled 2015-04-13 (×3): qty 1

## 2015-04-13 MED ORDER — SODIUM CHLORIDE 0.9 % IJ SOLN
3.0000 mL | Freq: Two times a day (BID) | INTRAMUSCULAR | Status: DC
  Administered 2015-04-13 – 2015-04-17 (×4): 3 mL via INTRAVENOUS

## 2015-04-13 MED ORDER — SODIUM CHLORIDE 0.9 % IV BOLUS (SEPSIS): 500.0000 mL | Freq: Once | INTRAVENOUS | Status: DC

## 2015-04-13 MED ORDER — SODIUM CHLORIDE 0.9 % IV SOLN
INTRAVENOUS | Status: DC
  Administered 2015-04-13 – 2015-04-16 (×5): via INTRAVENOUS

## 2015-04-13 MED ORDER — AMLODIPINE BESY-BENAZEPRIL HCL 10-40 MG PO CAPS: 1.0000 | ORAL_CAPSULE | Freq: Every day | ORAL | Status: DC

## 2015-04-13 MED ORDER — PANTOPRAZOLE SODIUM 40 MG PO TBEC
80.0000 mg | DELAYED_RELEASE_TABLET | Freq: Every day | ORAL | Status: DC
  Administered 2015-04-13 – 2015-04-17 (×4): 80 mg via ORAL
  Filled 2015-04-13 (×4): qty 2

## 2015-04-13 MED ORDER — CYCLOBENZAPRINE HCL 10 MG PO TABS: 5.0000 mg | ORAL_TABLET | Freq: Three times a day (TID) | ORAL | Status: DC | PRN

## 2015-04-13 MED ORDER — TRAMADOL HCL 50 MG PO TABS
50.0000 mg | ORAL_TABLET | Freq: Three times a day (TID) | ORAL | Status: DC
  Administered 2015-04-13 – 2015-04-17 (×9): 50 mg via ORAL
  Filled 2015-04-13 (×11): qty 1

## 2015-04-13 MED ORDER — OXYCODONE HCL 5 MG PO TABS
5.0000 mg | ORAL_TABLET | Freq: Four times a day (QID) | ORAL | Status: DC | PRN
  Administered 2015-04-14 – 2015-04-16 (×4): 5 mg via ORAL
  Filled 2015-04-13 (×4): qty 1

## 2015-04-13 MED ORDER — BENAZEPRIL HCL 10 MG PO TABS
40.0000 mg | ORAL_TABLET | Freq: Every day | ORAL | Status: DC
  Administered 2015-04-14: 40 mg via ORAL
  Administered 2015-04-16: 20 mg via ORAL
  Administered 2015-04-16 – 2015-04-17 (×2): 40 mg via ORAL
  Filled 2015-04-13 (×3): qty 4

## 2015-04-13 MED ORDER — ROSUVASTATIN CALCIUM 20 MG PO TABS
20.0000 mg | ORAL_TABLET | Freq: Every day | ORAL | Status: DC
  Administered 2015-04-13 – 2015-04-16 (×4): 20 mg via ORAL
  Filled 2015-04-13 (×4): qty 1

## 2015-04-13 MED ORDER — NICOTINE 14 MG/24HR TD PT24
14.0000 mg | MEDICATED_PATCH | Freq: Every day | TRANSDERMAL | Status: DC
  Administered 2015-04-13 – 2015-04-17 (×6): 14 mg via TRANSDERMAL
  Filled 2015-04-13 (×4): qty 1

## 2015-04-13 MED ORDER — HYDROCHLOROTHIAZIDE 25 MG PO TABS
25.0000 mg | ORAL_TABLET | Freq: Every morning | ORAL | Status: DC
  Administered 2015-04-14 – 2015-04-17 (×3): 25 mg via ORAL
  Filled 2015-04-13 (×3): qty 1

## 2015-04-13 MED ORDER — FENOFIBRATE 160 MG PO TABS
ORAL_TABLET | ORAL | Status: AC
  Filled 2015-04-13: qty 1

## 2015-04-13 MED ORDER — ONDANSETRON HCL 4 MG PO TABS: 4.0000 mg | ORAL_TABLET | Freq: Four times a day (QID) | ORAL | Status: DC | PRN

## 2015-04-13 MED ORDER — ASPIRIN 81 MG PO CHEW
81.0000 mg | CHEWABLE_TABLET | Freq: Every day | ORAL | Status: DC
  Administered 2015-04-13 – 2015-04-17 (×4): 81 mg via ORAL
  Filled 2015-04-13 (×3): qty 1

## 2015-04-13 MED ORDER — MORPHINE SULFATE (PF) 4 MG/ML IV SOLN
4.0000 mg | INTRAVENOUS | Status: DC | PRN
  Administered 2015-04-15: 4 mg via INTRAVENOUS
  Filled 2015-04-13 (×3): qty 1

## 2015-04-13 MED ORDER — SODIUM CHLORIDE 0.9 % IV BOLUS (SEPSIS)
1000.0000 mL | Freq: Once | INTRAVENOUS | Status: AC
  Administered 2015-04-13: 1000 mL via INTRAVENOUS

## 2015-04-13 MED ORDER — DEXTROSE 5 % IV SOLN
1.0000 g | INTRAVENOUS | Status: DC
  Filled 2015-04-13 (×3): qty 10

## 2015-04-13 MED ORDER — ONDANSETRON HCL 4 MG/2ML IJ SOLN
4.0000 mg | Freq: Four times a day (QID) | INTRAMUSCULAR | Status: DC | PRN
  Filled 2015-04-13: qty 2

## 2015-04-13 MED ORDER — INFLUENZA VAC SPLIT QUAD 0.5 ML IM SUSY
0.5000 mL | PREFILLED_SYRINGE | INTRAMUSCULAR | Status: AC
  Administered 2015-04-14: 0.5 mL via INTRAMUSCULAR
  Filled 2015-04-13: qty 0.5

## 2015-04-13 MED ORDER — SODIUM CHLORIDE 0.9 % IV SOLN
INTRAVENOUS | Status: DC
  Administered 2015-04-14: 14:00:00 via INTRAVENOUS

## 2015-04-13 MED ORDER — ACETAMINOPHEN 650 MG RE SUPP: 650.0000 mg | Freq: Four times a day (QID) | RECTAL | Status: DC | PRN

## 2015-04-13 MED ORDER — ONDANSETRON HCL 4 MG/2ML IJ SOLN
4.0000 mg | Freq: Once | INTRAMUSCULAR | Status: AC
  Administered 2015-04-13: 4 mg via INTRAVENOUS
  Filled 2015-04-13: qty 2

## 2015-04-13 MED ORDER — HEPARIN SODIUM (PORCINE) 5000 UNIT/ML IJ SOLN
5000.0000 [IU] | Freq: Three times a day (TID) | INTRAMUSCULAR | Status: DC
  Administered 2015-04-13 – 2015-04-14 (×2): 5000 [IU] via SUBCUTANEOUS
  Filled 2015-04-13 (×2): qty 1

## 2015-04-13 MED ORDER — AMLODIPINE BESYLATE 5 MG PO TABS
10.0000 mg | ORAL_TABLET | Freq: Every day | ORAL | Status: DC
  Administered 2015-04-14 – 2015-04-17 (×3): 10 mg via ORAL
  Filled 2015-04-13 (×3): qty 2

## 2015-04-13 MED ORDER — ACETAMINOPHEN 325 MG PO TABS: 650.0000 mg | ORAL_TABLET | Freq: Four times a day (QID) | ORAL | Status: DC | PRN

## 2015-04-13 MED ORDER — ASPIRIN 81 MG PO TABS
81.0000 mg | ORAL_TABLET | Freq: Every day | ORAL | Status: DC
Start: 2015-04-13 — End: 2015-04-13
  Administered 2015-04-13: 81 mg via ORAL

## 2015-04-13 NOTE — H&P (Signed)
[facility] History and Physical  THEDA PAYER ZOX:[id number] DOB: 1936-04-11 DOA: 04/13/2015  Referring physician: Dr. Roderic Palau - [facility] PCP: [name], FNP   Chief Complaint: Fall, weakness, abd pain  HPI: JASIA HILTUNEN is a 79 y.o. female  Level 5 caveat: Patient presenting in altered mental state and history provided family by family members and ED physician with limited history provided by patient.  1 week history of generalized weakness. Constant. Worsening. Associated with 2-3 falls in the home that are not associated with dizziness, lightheadedness, or seizure activity. These are by description mechanical in nature were patient Susy Frizzle gets weak and her legs give out. Patient is been complaining of right-sided abdominal pain during this period of time which has become acutely worse over the last 1-2 days. History of kidney stones. Pain radiates from the right flank to her suprapubic region. Intermittent chills but no fevers. Family also complaining of intermittent confusion for the past week.  Review of Systems:  No further review systems able to be obtained due to patient's altered mental state.    Past Medical History  Diagnosis Date  . Hypertension   . Hypercholesteremia   . Chronic kidney disease     hx of kidney stones  . Anxiety   . Depression   . Arthritis   . Shortness of breath     denies at preop of 10/22/11/  . GERD (gastroesophageal reflux disease)   . Nephrolithiasis    Past Surgical History  Procedure Laterality Date  . Cystoscopy w/ ureteroscopy w/ lithotripsy  80's  . Knee arthroscopy      bilateral  . Replacement total knee bilateral    . Joint replacement      bilateral knees  . Cataract extraction w/phaco  06/06/2011    Procedure: CATARACT EXTRACTION PHACO AND INTRAOCULAR LENS PLACEMENT (IOC);  Surgeon: Tonny Branch;  Location: [facility];  Service: Ophthalmology;  Laterality: Right;  CDE=12.77  . Cataract extraction w/phaco   06/27/2011    Procedure: CATARACT EXTRACTION PHACO AND INTRAOCULAR LENS PLACEMENT (IOC);  Surgeon: Tonny Branch;  Location: [facility];  Service: Ophthalmology;  Laterality: Left;  CDE:13.96  . Total hip arthroplasty  10/28/2011    Procedure: TOTAL HIP ARTHROPLASTY;  Surgeon: Gearlean Alf, MD;  Location: [facility];  Service: Orthopedics;  Laterality: Right;   Social History:  reports that she has been smoking Cigarettes.  She has a 15 pack-year smoking history. She has never used smokeless tobacco. She reports that she does not drink alcohol or use illicit drugs.  No Known Allergies  Family History  Problem Relation Age of Onset  . Anesthesia problems Neg Hx   . Hypotension Neg Hx   . Malignant hyperthermia Neg Hx   . Pseudochol deficiency Neg Hx   . Kidney disease Mother   . Congestive Heart Failure Father   . Heart disease Brother   . Alcohol abuse Brother      Prior to Admission medications   Medication Sig Start Date End Date Taking? Authorizing Provider  amLODipine-benazepril (LOTREL) 10-40 MG per capsule TAKE ONE CAPSULE EACH MORNING 02/24/15  Yes Mary-Margaret Hassell Done, FNP  aspirin 81 MG tablet Take 81 mg by mouth daily.   Yes Historical Provider, MD  cetirizine (ZYRTEC) 10 MG tablet Take 1 tablet (10 mg total) by mouth daily. 10/26/14  Yes Tiffany A Gann, PA-C  cyclobenzaprine (FLEXERIL) 5 MG tablet TAKE ONE TABLET BY MOUTH THREE TIMES DAILY AS NEEDED FOR MUSCLE SPASM 06/15/14  Yes Mary-Margaret  Hassell Done, FNP  fenofibrate 160 MG tablet Take 160 mg by mouth daily.  02/25/15  Yes Historical Provider, MD  hydrochlorothiazide (HYDRODIURIL) 25 MG tablet Take 1 tablet (25 mg total) by mouth every morning. 02/24/15  Yes Mary-Margaret Hassell Done, FNP  omeprazole (PRILOSEC) 40 MG capsule Take 1 capsule (40 mg total) by mouth daily. 02/24/15  Yes Mary-Margaret Hassell Done, FNP  rosuvastatin (CRESTOR) 20 MG tablet Take 1 tablet (20 mg total) by mouth at bedtime. 02/24/15  Yes Mary-Margaret Hassell Done, FNP  traMADol  (ULTRAM) 50 MG tablet Take 1 tablet (50 mg total) by mouth 3 (three) times daily. 03/01/15  Yes Mary-Margaret Hassell Done, FNP  albuterol (PROVENTIL HFA;VENTOLIN HFA) 108 (90 BASE) MCG/ACT inhaler Inhale 2 puffs into the lungs every 6 (six) hours as needed for wheezing or shortness of breath. Patient not taking: Reported on 02/24/2015 10/26/14   Tiffany A Gann, PA-C  fenofibrate micronized (ANTARA) 130 MG capsule Take 1 capsule (130 mg total) by mouth daily before breakfast. Patient not taking: Reported on 04/13/2015 02/25/15   Mary-Margaret Hassell Done, FNP   Physical Exam: Filed Vitals:   04/13/15 1548 04/13/15 1600 04/13/15 1830 04/13/15 1845  BP:  175/46    Pulse:  95    Temp: 98.1 F (36.7 C)     TempSrc: Oral     Resp:  31 36 30  Height:      Weight:      SpO2:  100%      Wt Readings from Last 3 Encounters:  04/13/15 102.967 kg (227 lb)  02/24/15 100.064 kg (220 lb 9.6 oz)  11/24/14 96.616 kg (213 lb)    General:  Moaning in pain in bed. Eyes:  PERRL, EOMI, normal lids, iris ENT:  grossly normal hearing, lips & tongue Neck:  no LAD, masses or thyromegaly Cardiovascular:  RRR, no m/r/g. Trace Lower extremity bilaterally.  Respiratory:  CTA bilaterally, no w/r/r. Normal respiratory effort. Abdomen:  Normal active bowel sounds, nondistended, suprapubic tenderness to palpation. No CVA tenderness. Skin:  no rash or induration seen on limited exam Musculoskeletal:  grossly normal tone BUE/BLE Psychiatric:  Answers most questions appropriately, alert and oriented to person and month only. Neurologic:  CN 2-12 grossly intact, moves all extremities in coordinated fashion.          Labs on Admission:  Basic Metabolic Panel:  Recent Labs Lab 04/13/15 1521  NA 134*  K 3.8  CL 100*  CO2 26  GLUCOSE 203*  BUN 24*  CREATININE 1.11*  CALCIUM 10.5*   Liver Function Tests:  Recent Labs Lab 04/13/15 1521  AST 32  ALT 24  ALKPHOS 52  BILITOT 1.1  PROT 7.9  ALBUMIN 3.5     Recent Labs Lab 04/13/15 1521  LIPASE 23   No results for input(s): AMMONIA in the last 168 hours. CBC:  Recent Labs Lab 04/13/15 1521  WBC 15.9*  NEUTROABS 14.0*  HGB 13.1  HCT 38.6  MCV 92.8  PLT 203   Cardiac Enzymes:  Recent Labs Lab 04/13/15 1521  TROPONINI 0.13*    BNP (last 3 results) No results for input(s): BNP in the last 8760 hours.  ProBNP (last 3 results) No results for input(s): PROBNP in the last 8760 hours.  CBG: No results for input(s): GLUCAP in the last 168 hours.  Radiological Exams on Admission: Dg Chest 2 View  04/13/2015  CLINICAL DATA:  Weakness, multiple falls EXAM: CHEST  2 VIEW COMPARISON:  10/22/2011 FINDINGS: Cardiomediastinal silhouette is stable. No acute infiltrate or  pleural effusion. No pulmonary edema. Thoracic spine osteopenia. IMPRESSION: No active cardiopulmonary disease. Electronically Signed   By: Lahoma Crocker M.D.   On: 04/13/2015 16:53   Ct Head Wo Contrast  04/13/2015  CLINICAL DATA:  Altered mental status.  Hypertension. EXAM: CT HEAD WITHOUT CONTRAST TECHNIQUE: Contiguous axial images were obtained from the base of the skull through the vertex without intravenous contrast. COMPARISON:  None. FINDINGS: Study is limited due to extension motion artifact despite several attempts to image the entire intracranial contents. There is age related volume loss. No appreciable mass, hemorrhage, extra-axial fluid collection, or midline shift is appreciable on this limited study. There is mild small vessel disease in the centra semiovale bilaterally. No acute infarct is demonstrable on this study. Bony calvarium appears grossly intact. The mastoid air cells appear clear. IMPRESSION: Limited study due to patient motion. Age related volume loss with mild periventricular small vessel disease. No acute infarct is appreciable on this less than optimal study. No hemorrhage or mass effect apparent. Electronically Signed   By: Lowella Grip III  M.D.   On: 04/13/2015 16:34   Ct Abdomen Pelvis W Contrast  04/13/2015  CLINICAL DATA:  79 year old female with acute abdominal and pelvic pain following fall 5 days ago. EXAM: CT ABDOMEN AND PELVIS WITH CONTRAST TECHNIQUE: Multidetector CT imaging of the abdomen and pelvis was performed using the standard protocol following bolus administration of intravenous contrast. CONTRAST:  172mL OMNIPAQUE IOHEXOL 300 MG/ML  SOLN COMPARISON:  10/28/2011 pelvis radiographs FINDINGS: Lower chest:  Unremarkable Hepatobiliary: Hepatic steatosis identified without focal hepatic abnormality. The gallbladder is unremarkable. There is no evidence of biliary dilatation. Pancreas: Unremarkable Spleen: Unremarkable Adrenals/Urinary Tract: A 4 mm distal right ureteral calculus (1 cm above the right UVJ causes moderate right hydroureteronephrosis. Multiple nonobstructing left upper pole renal calculi are identified, the largest measuring 7 mm. Bilateral adrenal hyperplasia is identified. The bladder is unremarkable. Stomach/Bowel: There is no evidence of bowel obstruction or focal bowel wall thickening. The appendix is unremarkable. Vascular/Lymphatic: No enlarged lymph nodes identified. Aortic atherosclerotic calcification noted without aneurysm. Reproductive: Uterus and adnexal regions are within normal limits. Other: No free fluid, pneumoperitoneum or abscess. 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 No acute or suspicious abnormalities are identified. Moderate degenerative changes within the lumbar spine identified. IMPRESSION: Obstructing 4 mm distal right ureteral calculus (1 cm above the right UVJ) causing moderate right hydroureteronephrosis. Nonobstructing left renal calculi, hepatic steatosis and abdominal aortic atherosclerosis. Electronically Signed   By: Margarette Canada M.D.   On: 04/13/2015 16:38   Dg Hip Unilat With Pelvis 2-3 Views Right  04/13/2015  CLINICAL  DATA:  Pain following fall 5 days prior EXAM: DG HIP (WITH OR WITHOUT PELVIS) 2-3V RIGHT COMPARISON:  October 28, 2011 FINDINGS: Frontal pelvis as well as frontal and lateral right hip images were obtained. There is a total hip prosthesis on the right with prosthetic components appearing well-seated. No acute fracture or dislocation. Left hip joint appears unremarkable. There is lower lumbar levoscoliosis. IMPRESSION: No fracture or dislocation. Total hip prosthetic components on the right appear well seated. Electronically Signed   By: Lowella Grip III M.D.   On: 04/13/2015 14:10     Assessment/Plan Principal Problem:   Acute encephalopathy Active Problems:   Essential hypertension, benign   GERD (gastroesophageal reflux disease)   UTI (lower urinary tract infection)   Ureteral stone with hydronephrosis   Chronic pain  Acute encephalopathy: Mild and intermittent.  Currently patient alert and oriented to person and month. Likely secondary to infection. No underlying history of dementia. No evidence of sepsis. Lactic acid 1.9, WBC 15.9, afebrile, tachypneic, UA grossly abnormal. abdominal CT scan showing 68mm obstructive stone at the UVJ on the right. CT head normal - Telemetry (elevated troponin) - Treatment of stone as below - Follow-up urine culture blood culture  R obstructive Ureteral stone: Stone located at the right UVJ and 47mm. Right hydronephrosis noted. Urology consult by ED physician and stated that they will likely see patient here in an independent but requested to be reconsulted in the morning on 04/14/2015. - Urine culture - IVF - Ceftriaxone - flomax - Consult urology in a.m. on 04/14/2015.  Elevated trop: 0.13 on admission. No sign of ACS on EKG - cycle trop - Tele - EKG in am  HTN: - continue home norvasc, benazepril, HCTZ  HLD: - continue fenofibrate, crestor  Chronic pain: - continue tramadol  GERD: - continue PPI  MSK pain: - continue flexeril,  tramadol  Code Status: FULL  DVT Prophylaxis: Hep Family Communication: Daughters x2 Disposition Plan: Pending Improvement    Cashawn Yanko Lenna Sciara, MD Family Medicine [facility] [email or web address] Password [email or web address]

## 2015-04-13 NOTE — Progress Notes (Signed)
When patient arrived to the floor she had pulled her IV out.  Catheter intact. New IV placed.  Patient tolerated procedure well.

## 2015-04-13 NOTE — Progress Notes (Signed)
Lab called with a critical Lactid Acid Level of 2.1.  MD notified.

## 2015-04-13 NOTE — ED Notes (Signed)
Pt states her leg gave out and she fell Saturday. Complain of pain in right hip

## 2015-04-13 NOTE — ED Provider Notes (Signed)
CSN: [id number]     Arrival date & time 04/13/15  1250 History   First MD Initiated Contact with Patient 04/13/15 1419     Chief Complaint  Patient presents with  . Fall     (Consider location/radiation/quality/duration/timing/severity/associated sxs/prior Treatment) Patient is a 79 y.o. female presenting with fall. The history is provided by the patient.  Fall Associated symptoms include abdominal pain. Pertinent negatives include no chest pain, no headaches and no shortness of breath.   patient came in with concerns for fall on Saturday and concern about right hip pain. X-rays were ordered by nursing staff for this. Then family arrived and turns out that there is been a fall today as well patient's been kind of not her usual self generalized weakness fatigue a little bit of confusion all week. Patient complaining of right-sided abdominal pain. Patient denies any chest pain headache nausea vomiting or diarrhea. Denies any dysuria. Patient was complaining of fever. No documented fever here. Patient did have some Aleve at home this morning.  Past Medical History  Diagnosis Date  . Hypertension   . Hypercholesteremia   . Chronic kidney disease     hx of kidney stones  . Anxiety   . Depression   . Arthritis   . Shortness of breath     denies at preop of 10/22/11/  . GERD (gastroesophageal reflux disease)    Past Surgical History  Procedure Laterality Date  . Cystoscopy w/ ureteroscopy w/ lithotripsy  80's  . Knee arthroscopy      bilateral  . Replacement total knee bilateral    . Joint replacement      bilateral knees  . Cataract extraction w/phaco  06/06/2011    Procedure: CATARACT EXTRACTION PHACO AND INTRAOCULAR LENS PLACEMENT (IOC);  Surgeon: Tonny Branch;  Location: [facility];  Service: Ophthalmology;  Laterality: Right;  CDE=12.77  . Cataract extraction w/phaco  06/27/2011    Procedure: CATARACT EXTRACTION PHACO AND INTRAOCULAR LENS PLACEMENT (IOC);  Surgeon: Tonny Branch;   Location: [facility];  Service: Ophthalmology;  Laterality: Left;  CDE:13.96  . Total hip arthroplasty  10/28/2011    Procedure: TOTAL HIP ARTHROPLASTY;  Surgeon: Gearlean Alf, MD;  Location: [facility];  Service: Orthopedics;  Laterality: Right;   Family History  Problem Relation Age of Onset  . Anesthesia problems Neg Hx   . Hypotension Neg Hx   . Malignant hyperthermia Neg Hx   . Pseudochol deficiency Neg Hx   . Kidney disease Mother   . Congestive Heart Failure Father   . Heart disease Brother   . Alcohol abuse Brother    Social History  Substance Use Topics  . Smoking status: Current Every Day Smoker -- 0.50 packs/day for 30 years    Types: Cigarettes  . Smokeless tobacco: Never Used  . Alcohol Use: No   OB History    No data available     Review of Systems  Constitutional: Positive for fever and fatigue.  HENT: Negative for congestion.   Eyes: Negative for visual disturbance.  Respiratory: Negative for shortness of breath.   Cardiovascular: Negative for chest pain.  Gastrointestinal: Positive for abdominal pain. Negative for nausea, vomiting and diarrhea.  Genitourinary: Negative for dysuria.  Musculoskeletal: Negative for back pain and neck pain.  Neurological: Positive for weakness. Negative for headaches.  Hematological: Does not bruise/bleed easily.  Psychiatric/Behavioral: Positive for confusion.      Allergies  Review of patient's allergies indicates no known allergies.  Home Medications  Prior to Admission medications   Medication Sig Start Date End Date Taking? Authorizing Provider  amLODipine-benazepril (LOTREL) 10-40 MG per capsule TAKE ONE CAPSULE EACH MORNING 02/24/15  Yes Mary-Margaret Hassell Done, FNP  aspirin 81 MG tablet Take 81 mg by mouth daily.   Yes Historical Provider, MD  cetirizine (ZYRTEC) 10 MG tablet Take 1 tablet (10 mg total) by mouth daily. 10/26/14  Yes Tiffany A Gann, PA-C  cyclobenzaprine (FLEXERIL) 5 MG tablet TAKE ONE TABLET BY MOUTH  THREE TIMES DAILY AS NEEDED FOR MUSCLE SPASM 06/15/14  Yes Mary-Margaret Hassell Done, FNP  fenofibrate 160 MG tablet Take 160 mg by mouth daily.  02/25/15  Yes Historical Provider, MD  hydrochlorothiazide (HYDRODIURIL) 25 MG tablet Take 1 tablet (25 mg total) by mouth every morning. 02/24/15  Yes Mary-Margaret Hassell Done, FNP  omeprazole (PRILOSEC) 40 MG capsule Take 1 capsule (40 mg total) by mouth daily. 02/24/15  Yes Mary-Margaret Hassell Done, FNP  rosuvastatin (CRESTOR) 20 MG tablet Take 1 tablet (20 mg total) by mouth at bedtime. 02/24/15  Yes Mary-Margaret Hassell Done, FNP  traMADol (ULTRAM) 50 MG tablet Take 1 tablet (50 mg total) by mouth 3 (three) times daily. 03/01/15  Yes Mary-Margaret Hassell Done, FNP  albuterol (PROVENTIL HFA;VENTOLIN HFA) 108 (90 BASE) MCG/ACT inhaler Inhale 2 puffs into the lungs every 6 (six) hours as needed for wheezing or shortness of breath. Patient not taking: Reported on 02/24/2015 10/26/14   Tiffany A Gann, PA-C  fenofibrate micronized (ANTARA) 130 MG capsule Take 1 capsule (130 mg total) by mouth daily before breakfast. Patient not taking: Reported on 04/13/2015 02/25/15   Mary-Margaret Hassell Done, FNP   BP 171/57 mmHg  Pulse 95  Temp(Src) 98.1 F (36.7 C) (Oral)  Resp 32  Ht 5' 10.5" (1.791 m)  Wt 227 lb (102.967 kg)  BMI 32.10 kg/m2  SpO2 99% Physical Exam  Constitutional: She appears well-developed and well-nourished. No distress.  HENT:  Head: Normocephalic and atraumatic.  Mouth/Throat: Oropharynx is clear and moist.  Eyes: Conjunctivae and EOM are normal. Pupils are equal, round, and reactive to light.  Neck: Normal range of motion. Neck supple.  Cardiovascular: Normal rate, regular rhythm and normal heart sounds.   No murmur heard. Pulmonary/Chest: Effort normal and breath sounds normal. No respiratory distress.  Abdominal: Soft. Bowel sounds are normal. There is no tenderness.  Musculoskeletal: Normal range of motion. She exhibits no edema.  Neurological: She is alert. No  cranial nerve deficit. She exhibits normal muscle tone. Coordination normal.  Skin: Skin is warm. No erythema.  Nursing note and vitals reviewed.   ED Course  Procedures (including critical care time) Labs Review Labs Reviewed  URINALYSIS, ROUTINE W REFLEX MICROSCOPIC (NOT AT [facility]) - Abnormal; Notable for the following:    APPearance CLOUDY (*)    Hgb urine dipstick LARGE (*)    Protein, ur 100 (*)    Nitrite POSITIVE (*)    Leukocytes, UA MODERATE (*)    All other components within normal limits  COMPREHENSIVE METABOLIC PANEL - Abnormal; Notable for the following:    Sodium 134 (*)    Chloride 100 (*)    Glucose, Bld 203 (*)    BUN 24 (*)    Creatinine, Ser 1.11 (*)    Calcium 10.5 (*)    GFR calc non Af Amer 47 (*)    GFR calc Af Amer 54 (*)    All other components within normal limits  TROPONIN I - Abnormal; Notable for the following:    Troponin I 0.13 (*)  All other components within normal limits  URINE MICROSCOPIC-ADD ON - Abnormal; Notable for the following:    Squamous Epithelial / LPF MANY (*)    Bacteria, UA MANY (*)    All other components within normal limits  LIPASE, BLOOD  CBC WITH DIFFERENTIAL/PLATELET   Results for orders placed or performed during the hospital encounter of 04/13/15  Urinalysis, Routine w reflex microscopic (not at [facility])  Result Value Ref Range   Color, Urine YELLOW YELLOW   APPearance CLOUDY (A) CLEAR   Specific Gravity, Urine 1.020 1.005 - 1.030   pH 6.0 5.0 - 8.0   Glucose, UA NEGATIVE NEGATIVE mg/dL   Hgb urine dipstick LARGE (A) NEGATIVE   Bilirubin Urine NEGATIVE NEGATIVE   Ketones, ur NEGATIVE NEGATIVE mg/dL   Protein, ur 100 (A) NEGATIVE mg/dL   Urobilinogen, UA 0.2 0.0 - 1.0 mg/dL   Nitrite POSITIVE (A) NEGATIVE   Leukocytes, UA MODERATE (A) NEGATIVE  Comprehensive metabolic panel  Result Value Ref Range   Sodium 134 (L) 135 - 145 mmol/L   Potassium 3.8 3.5 - 5.1 mmol/L   Chloride 100 (L) 101 - 111 mmol/L   CO2 26 22  - 32 mmol/L   Glucose, Bld 203 (H) 65 - 99 mg/dL   BUN 24 (H) 6 - 20 mg/dL   Creatinine, Ser 1.11 (H) 0.44 - 1.00 mg/dL   Calcium 10.5 (H) 8.9 - 10.3 mg/dL   Total Protein 7.9 6.5 - 8.1 g/dL   Albumin 3.5 3.5 - 5.0 g/dL   AST 32 15 - 41 U/L   ALT 24 14 - 54 U/L   Alkaline Phosphatase 52 38 - 126 U/L   Total Bilirubin 1.1 0.3 - 1.2 mg/dL   GFR calc non Af Amer 47 (L) >60 mL/min   GFR calc Af Amer 54 (L) >60 mL/min   Anion gap 8 5 - 15  Lipase, blood  Result Value Ref Range   Lipase 23 22 - 51 U/L  Troponin I  Result Value Ref Range   Troponin I 0.13 (H) <0.031 ng/mL  Urine microscopic-add on  Result Value Ref Range   Squamous Epithelial / LPF MANY (A) RARE   WBC, UA TOO NUMEROUS TO COUNT <3 WBC/hpf   RBC / HPF TOO NUMEROUS TO COUNT <3 RBC/hpf   Bacteria, UA MANY (A) RARE   Urine-Other MUCOUS PRESENT      Imaging Review Dg Hip Unilat With Pelvis 2-3 Views Right  04/13/2015  CLINICAL DATA:  Pain following fall 5 days prior EXAM: DG HIP (WITH OR WITHOUT PELVIS) 2-3V RIGHT COMPARISON:  October 28, 2011 FINDINGS: Frontal pelvis as well as frontal and lateral right hip images were obtained. There is a total hip prosthesis on the right with prosthetic components appearing well-seated. No acute fracture or dislocation. Left hip joint appears unremarkable. There is lower lumbar levoscoliosis. IMPRESSION: No fracture or dislocation. Total hip prosthetic components on the right appear well seated. Electronically Signed   By: Lowella Grip III M.D.   On: 04/13/2015 14:10   I have personally reviewed and evaluated these images and lab results as part of my medical decision-making.   EKG Interpretation   Date/Time:  Thursday April 13 2015 15:13:59 EDT Ventricular Rate:  90 PR Interval:  166 QRS Duration: 84 QT Interval:  315 QTC Calculation: 385 R Axis:   83 Text Interpretation:  Sinus rhythm Borderline right axis deviation Minimal  ST depression, inferior leads Baseline wander  in lead(s) II III aVF  Confirmed by Rogene Houston  MD, [place] [phone]) on 04/13/2015 3:53:36 PM      MDM   Final diagnoses:  UTI (lower urinary tract infection)  Weakness    Patient initially presented for concerns about a fall that occurred on Saturday. When the patient's daughters arrived there was also a fall today. Patient not really with complaint of hip pain here moves legs fine. But complain of pain on the left side of the abdomen. X-rays of the hip and pelvis were negative.  However rest of workup showing significant abnormalities. Marked urinary tract infection which may explain the weakness that the family members talked about the fact that she hasn't been her usual self. The weakness is general lysed. Unusual for her to fall as well. Patient denies any headache chest pain shortness of breath does admit to the abdominal pain.  Patient will be treated for the urinary tract infection with Rocephin. As follow-up to this blood cultures urine culture and lactic acid is pending but labs showed no evidence consistent with face septic picture.   Patient's lab workup all shows shows evidence of dehydration and clinically her mucous membranes are dry she is receiving fluids. Patient also had an elevated troponin. Again no history of any chest pain EKG showed minimal ST depression in inferior leads. There was no ST segment elevation and patient has no complaint of pain.  Patient CT of head chest x-ray and abdominal CT for the complaint of the belly pain are all pending. Patient will most likely require admission to matter what the show. However they could change where the patient would be admitted to or at.   Fredia Sorrow, MD 04/13/15 [id number]

## 2015-04-14 DIAGNOSIS — N132 Hydronephrosis with renal and ureteral calculous obstruction: Secondary | ICD-10-CM | POA: Diagnosis not present

## 2015-04-14 DIAGNOSIS — A4159 Other Gram-negative sepsis: Secondary | ICD-10-CM | POA: Diagnosis not present

## 2015-04-14 DIAGNOSIS — N390 Urinary tract infection, site not specified: Secondary | ICD-10-CM | POA: Diagnosis not present

## 2015-04-14 DIAGNOSIS — F17210 Nicotine dependence, cigarettes, uncomplicated: Secondary | ICD-10-CM | POA: Diagnosis not present

## 2015-04-14 DIAGNOSIS — G9340 Encephalopathy, unspecified: Secondary | ICD-10-CM | POA: Diagnosis not present

## 2015-04-14 DIAGNOSIS — R112 Nausea with vomiting, unspecified: Secondary | ICD-10-CM

## 2015-04-14 DIAGNOSIS — E7800 Pure hypercholesterolemia, unspecified: Secondary | ICD-10-CM | POA: Diagnosis not present

## 2015-04-14 DIAGNOSIS — I129 Hypertensive chronic kidney disease with stage 1 through stage 4 chronic kidney disease, or unspecified chronic kidney disease: Secondary | ICD-10-CM | POA: Diagnosis not present

## 2015-04-14 DIAGNOSIS — E860 Dehydration: Secondary | ICD-10-CM | POA: Diagnosis not present

## 2015-04-14 DIAGNOSIS — R1084 Generalized abdominal pain: Secondary | ICD-10-CM

## 2015-04-14 DIAGNOSIS — K219 Gastro-esophageal reflux disease without esophagitis: Secondary | ICD-10-CM | POA: Diagnosis not present

## 2015-04-14 LAB — PROTIME-INR
INR: 1.35 (ref 0.00–1.49)
PROTHROMBIN TIME: 16.8 s — AB (ref 11.6–15.2)

## 2015-04-14 LAB — APTT: aPTT: 40 seconds — ABNORMAL HIGH (ref 24–37)

## 2015-04-14 MED ORDER — HEPARIN SODIUM (PORCINE) 5000 UNIT/ML IJ SOLN
5000.0000 [IU] | Freq: Three times a day (TID) | INTRAMUSCULAR | Status: DC
  Administered 2015-04-16 – 2015-04-17 (×4): 5000 [IU] via SUBCUTANEOUS
  Filled 2015-04-14 (×3): qty 1

## 2015-04-14 MED ORDER — PIPERACILLIN-TAZOBACTAM 3.375 G IVPB 30 MIN
3.3750 g | Freq: Once | INTRAVENOUS | Status: AC
  Administered 2015-04-14: 3.375 g via INTRAVENOUS
  Filled 2015-04-14: qty 50

## 2015-04-14 MED ORDER — PIPERACILLIN-TAZOBACTAM 3.375 G IVPB
3.3750 g | Freq: Three times a day (TID) | INTRAVENOUS | Status: DC
  Administered 2015-04-14 – 2015-04-17 (×8): 3.375 g via INTRAVENOUS
  Filled 2015-04-14 (×12): qty 50

## 2015-04-14 MED ORDER — HEPARIN SODIUM (PORCINE) 5000 UNIT/ML IJ SOLN
5000.0000 [IU] | Freq: Three times a day (TID) | INTRAMUSCULAR | Status: DC
  Filled 2015-04-14: qty 1

## 2015-04-14 MED ORDER — HEPARIN SODIUM (PORCINE) 5000 UNIT/ML IJ SOLN
5000.0000 [IU] | Freq: Three times a day (TID) | INTRAMUSCULAR | Status: AC
  Administered 2015-04-14 (×2): 5000 [IU] via SUBCUTANEOUS
  Filled 2015-04-14: qty 1

## 2015-04-14 NOTE — Progress Notes (Signed)
Patient ID: Caitlin Mccarthy, female   DOB: 13-Oct-1935, 79 y.o.   MRN: [id number]  Rt hydronephrosis Request made for PCN per Dr Linward Headland with procedure 10/15 at [facility]  Orders in to have pt to [facility] via ambulance by 900 am 10./15 Will return to [facility] after procedure  Consents self RN aware

## 2015-04-14 NOTE — Progress Notes (Signed)
CRITICAL VALUE ALERT  Critical value received:  Blood cultures (1st anaerobic and 2nd aerobic) POSITIVE for gram negative rods  Date of notification:  04/14/2015  Time of notification:  0923  Critical value read back:Yes.    Nurse who received alert:  Jacqlyn Larsen RN  MD notified (1st page):  Rhetta Mura  Time of first page:  [phone]

## 2015-04-14 NOTE — Progress Notes (Signed)
ANTIBIOTIC CONSULT NOTE - INITIAL  Pharmacy Consult for zosyn Indication: gnr bacteremia  No Known Allergies  Patient Measurements: Height: 5\' 10"  (177.8 cm) Weight: 215 lb 13.3 oz (97.9 kg) IBW/kg (Calculated) : 68.5   Vital Signs: Temp: 99.6 F (37.6 C) (10/14 0718) Temp Source: Oral (10/14 0718) BP: 130/41 mmHg (10/14 0509) Pulse Rate: 95 (10/14 0509) Intake/Output from previous day: 10/13 0701 - 10/14 0700 In: 1050 [I.V.:1050] Out: -  Intake/Output from this shift:    Labs:  Recent Labs  04/13/15 1521  WBC 15.9*  HGB 13.1  PLT 203  CREATININE 1.11*   Estimated Creatinine Clearance: 54.7 mL/min (by C-G formula based on Cr of 1.11). No results for input(s): VANCOTROUGH, VANCOPEAK, VANCORANDOM, GENTTROUGH, GENTPEAK, GENTRANDOM, TOBRATROUGH, TOBRAPEAK, TOBRARND, AMIKACINPEAK, AMIKACINTROU, AMIKACIN in the last 72 hours.   Microbiology: Recent Results (from the past 720 hour(s))  Urine culture     Status: None (Preliminary result)   Collection Time: 04/13/15  2:56 PM  Result Value Ref Range Status   Specimen Description URINE, CLEAN CATCH  Final   Special Requests NONE  Final   Culture   Final    CULTURE REINCUBATED FOR BETTER GROWTH Performed at [facility]    Report Status PENDING  Incomplete  Culture, blood (routine x 2)     Status: None (Preliminary result)   Collection Time: 04/13/15  3:25 PM  Result Value Ref Range Status   Specimen Description BLOOD RIGHT ANTECUBITAL  Final   Special Requests BOTTLES DRAWN AEROBIC ONLY 6CC  Final   Culture  Setup Time   Final    GRAM NEGATIVE RODS Gram Stain Report Called to,Read Back By and Verified With: BROWER, B AT 0925 ON 04/14/2015 BY WOODS, M    Culture NO GROWTH < 12 HOURS  Final   Report Status PENDING  Incomplete  Culture, blood (routine x 2)     Status: None (Preliminary result)   Collection Time: 04/13/15  4:45 PM  Result Value Ref Range Status   Specimen Description BLOOD LEFT HAND  Final   Special Requests   Final    BOTTLES DRAWN AEROBIC AND ANAEROBIC AEB=8CC ANA=5CC   Culture  Setup Time   Final    GRAM NEGATIVE RODS Gram Stain Report Called to,Read Back By and Verified With: PATTON, M AT 0715 ON 04/14/2015 BY WOODS, M GRAM NEGATIVE RODS Gram Stain Report Called to,Read Back By and Verified With: SEEN IN ANAROBIC BOTTLE CALLED TO [place], B AT [place] ON 04/14/15 BY WOODS,M    Culture PENDING  Incomplete   Report Status PENDING  Incomplete    Medical History: Past Medical History  Diagnosis Date  . Hypertension   . Hypercholesteremia   . Chronic kidney disease     hx of kidney stones  . Anxiety   . Depression   . Arthritis   . Shortness of breath     denies at preop of 10/22/11/  . GERD (gastroesophageal reflux disease)   . Nephrolithiasis     Medications:  Prescriptions prior to admission  Medication Sig Dispense Refill Last Dose  . amLODipine-benazepril (LOTREL) 10-40 MG per capsule TAKE ONE CAPSULE EACH MORNING 30 capsule 5 04/13/2015 at Unknown time  . aspirin 81 MG tablet Take 81 mg by mouth daily.   04/13/2015 at Unknown time  . cetirizine (ZYRTEC) 10 MG tablet Take 1 tablet (10 mg total) by mouth daily. 30 tablet 11 unknown  . cyclobenzaprine (FLEXERIL) 5 MG tablet TAKE ONE TABLET BY  MOUTH THREE TIMES DAILY AS NEEDED FOR MUSCLE SPASM 30 tablet 1 unknown  . fenofibrate 160 MG tablet Take 160 mg by mouth daily.    unknown  . hydrochlorothiazide (HYDRODIURIL) 25 MG tablet Take 1 tablet (25 mg total) by mouth every morning. 30 tablet 5 04/13/2015 at Unknown time  . omeprazole (PRILOSEC) 40 MG capsule Take 1 capsule (40 mg total) by mouth daily. 30 capsule 5 04/13/2015 at Unknown time  . rosuvastatin (CRESTOR) 20 MG tablet Take 1 tablet (20 mg total) by mouth at bedtime. 30 tablet 5 unknown  . traMADol (ULTRAM) 50 MG tablet Take 1 tablet (50 mg total) by mouth 3 (three) times daily. 90 tablet 0 04/13/2015 at Unknown time  . albuterol (PROVENTIL HFA;VENTOLIN HFA)  108 (90 BASE) MCG/ACT inhaler Inhale 2 puffs into the lungs every 6 (six) hours as needed for wheezing or shortness of breath. (Patient not taking: Reported on 02/24/2015) 1 Inhaler 1 Not Taking  . fenofibrate micronized (ANTARA) 130 MG capsule Take 1 capsule (130 mg total) by mouth daily before breakfast. (Patient not taking: Reported on 04/13/2015) 30 capsule 5    Assessment: 79 yo lady admitted with fall and weakness to start zosyn for gnr in blood cultures.  Her CrCl ~ 55 ml/min.  Goal of Therapy:  Eradication of infection  Plan:  Zosyn 3.375 gm IV q8 hours F/u renal function, cultures and clinical course  Thanks for allowing pharmacy to be a part of this patient's care.  Excell Seltzer, PharmD Clinical Pharmacist 04/14/2015,10:24 AM

## 2015-04-14 NOTE — Care Management Important Message (Signed)
Important Message  Patient Details  Name: MEILI KLECKLEY MRN: [id number] Date of Birth: 1936/03/02   Medicare Important Message Given:  Yes-second notification given    Sherald Barge, RN 04/14/2015, 3:44 PM

## 2015-04-14 NOTE — Consult Note (Signed)
Urology Consult  Referring physician: Dr. Wyline Copas Reason for referral: Ureteral calculi, hydronephrosis  Chief Complaint: right flank pain  History of Present Illness: Caitlin Mccarthy is a 79yo with a hx of nephrolithiasis who presented to [facility] ER yesterday with a 1 week hx of right abdominal pain, nuasea/vomiting. Her pain is dull, intermittent, moderate and nonradiating. She has a 2 day hx of fevers, chills, confusion, and fatigue. She has urgency, frequency, and dysuria.  She has GNR growing in her urine and blood cultures. WBC count is 15.9, Lactate 2.1, creatinine 1.1.  Ct scan shows a 29m right UVJ stone with moderate hydronephrosis and multiple left lower pole renal calculi. She has had 4 stone events int he past and has required ESWL and ureteroscopy.    Past Medical History  Diagnosis Date  . Hypertension   . Hypercholesteremia   . Chronic kidney disease     hx of kidney stones  . Anxiety   . Depression   . Arthritis   . Shortness of breath     denies at preop of 10/22/11/  . GERD (gastroesophageal reflux disease)   . Nephrolithiasis    Past Surgical History  Procedure Laterality Date  . Cystoscopy w/ ureteroscopy w/ lithotripsy  80's  . Knee arthroscopy      bilateral  . Replacement total knee bilateral    . Joint replacement      bilateral knees  . Cataract extraction w/phaco  06/06/2011    Procedure: CATARACT EXTRACTION PHACO AND INTRAOCULAR LENS PLACEMENT (IOC);  Surgeon: KTonny Branch  Location: [facility];  Service: Ophthalmology;  Laterality: Right;  CDE=12.77  . Cataract extraction w/phaco  06/27/2011    Procedure: CATARACT EXTRACTION PHACO AND INTRAOCULAR LENS PLACEMENT (IOC);  Surgeon: KTonny Branch  Location: [facility];  Service: Ophthalmology;  Laterality: Left;  CDE:13.96  . Total hip arthroplasty  10/28/2011    Procedure: TOTAL HIP ARTHROPLASTY;  Surgeon: FGearlean Alf MD;  Location: [facility];  Service: Orthopedics;  Laterality: Right;    Medications: I have reviewed  the patient's current medications. Allergies: No Known Allergies  Family History  Problem Relation Age of Onset  . Anesthesia problems Neg Hx   . Hypotension Neg Hx   . Malignant hyperthermia Neg Hx   . Pseudochol deficiency Neg Hx   . Kidney disease Mother   . Congestive Heart Failure Father   . Heart disease Brother   . Alcohol abuse Brother    Social History:  reports that she has been smoking Cigarettes.  She has a 15 pack-year smoking history. She has never used smokeless tobacco. She reports that she does not drink alcohol or use illicit drugs.  Review of Systems  Constitutional: Positive for fever, malaise/fatigue and diaphoresis.  Gastrointestinal: Positive for nausea, vomiting and abdominal pain.  Genitourinary: Positive for dysuria, urgency and flank pain.  All other systems reviewed and are negative.   Physical Exam:  Vital signs in last 24 hours: Temp:  [98.1 F (36.7 C)-99.6 F (37.6 C)] 99.6 F (37.6 C) (10/14 0718) Pulse Rate:  [90-104] 95 (10/14 0509) Resp:  [18-36] 20 (10/14 0509) BP: (130-175)/(41-57) 130/41 mmHg (10/14 0509) SpO2:  [93 %-100 %] 93 % (10/14 0509) Weight:  [97.9 kg (215 lb 13.3 oz)-102.967 kg (227 lb)] 97.9 kg (215 lb 13.3 oz) (10/13 2012) Physical Exam  Constitutional: She is oriented to person, place, and time. She appears well-developed and well-nourished.  HENT:  Head: Normocephalic and atraumatic.  Eyes: EOM are normal. Pupils  are equal, round, and reactive to light.  Neck: Normal range of motion. No thyromegaly present.  Cardiovascular: Normal rate and regular rhythm.   Respiratory: Effort normal. No respiratory distress.  GI: Soft. She exhibits no distension.  Musculoskeletal: Normal range of motion.  Neurological: She is alert and oriented to person, place, and time.  Skin: Skin is warm and dry.  Psychiatric: She has a normal mood and affect. Her behavior is normal. Judgment and thought content normal.    Laboratory Data:   Results for orders placed or performed during the hospital encounter of 04/13/15 (from the past 72 hour(s))  Urinalysis, Routine w reflex microscopic (not at [facility])     Status: Abnormal   Collection Time: 04/13/15  2:51 PM  Result Value Ref Range   Color, Urine YELLOW YELLOW   APPearance CLOUDY (A) CLEAR   Specific Gravity, Urine 1.020 1.005 - 1.030   pH 6.0 5.0 - 8.0   Glucose, UA NEGATIVE NEGATIVE mg/dL   Hgb urine dipstick LARGE (A) NEGATIVE   Bilirubin Urine NEGATIVE NEGATIVE   Ketones, ur NEGATIVE NEGATIVE mg/dL   Protein, ur 100 (A) NEGATIVE mg/dL   Urobilinogen, UA 0.2 0.0 - 1.0 mg/dL   Nitrite POSITIVE (A) NEGATIVE   Leukocytes, UA MODERATE (A) NEGATIVE  Urine microscopic-add on     Status: Abnormal   Collection Time: 04/13/15  2:51 PM  Result Value Ref Range   Squamous Epithelial / LPF MANY (A) RARE   WBC, UA TOO NUMEROUS TO COUNT <3 WBC/hpf   RBC / HPF TOO NUMEROUS TO COUNT <3 RBC/hpf   Bacteria, UA MANY (A) RARE   Urine-Other MUCOUS PRESENT   Urine culture     Status: None (Preliminary result)   Collection Time: 04/13/15  2:56 PM  Result Value Ref Range   Specimen Description URINE, CLEAN CATCH    Special Requests NONE    Culture      CULTURE REINCUBATED FOR BETTER GROWTH Performed at [facility]    Report Status PENDING   Comprehensive metabolic panel     Status: Abnormal   Collection Time: 04/13/15  3:21 PM  Result Value Ref Range   Sodium 134 (L) 135 - 145 mmol/L   Potassium 3.8 3.5 - 5.1 mmol/L   Chloride 100 (L) 101 - 111 mmol/L   CO2 26 22 - 32 mmol/L   Glucose, Bld 203 (H) 65 - 99 mg/dL   BUN 24 (H) 6 - 20 mg/dL   Creatinine, Ser 1.11 (H) 0.44 - 1.00 mg/dL   Calcium 10.5 (H) 8.9 - 10.3 mg/dL   Total Protein 7.9 6.5 - 8.1 g/dL   Albumin 3.5 3.5 - 5.0 g/dL   AST 32 15 - 41 U/L   ALT 24 14 - 54 U/L   Alkaline Phosphatase 52 38 - 126 U/L   Total Bilirubin 1.1 0.3 - 1.2 mg/dL   GFR calc non Af Amer 47 (L) >60 mL/min   GFR calc Af Amer 54 (L)  >60 mL/min    Comment: (NOTE) The eGFR has been calculated using the CKD EPI equation. This calculation has not been validated in all clinical situations. eGFR's persistently <60 mL/min signify possible Chronic Kidney Disease.    Anion gap 8 5 - 15  Lipase, blood     Status: None   Collection Time: 04/13/15  3:21 PM  Result Value Ref Range   Lipase 23 22 - 51 U/L  CBC with Differential/Platelet     Status: Abnormal  Collection Time: 04/13/15  3:21 PM  Result Value Ref Range   WBC 15.9 (H) 4.0 - 10.5 K/uL   RBC 4.16 3.87 - 5.11 MIL/uL   Hemoglobin 13.1 12.0 - 15.0 g/dL   HCT 38.6 36.0 - 46.0 %   MCV 92.8 78.0 - 100.0 fL   MCH 31.5 26.0 - 34.0 pg   MCHC 33.9 30.0 - 36.0 g/dL   RDW 12.7 11.5 - 15.5 %   Platelets 203 150 - 400 K/uL   Neutrophils Relative % 88 %   Neutro Abs 14.0 (H) 1.7 - 7.7 K/uL   Lymphocytes Relative 6 %   Lymphs Abs 1.0 0.7 - 4.0 K/uL   Monocytes Relative 6 %   Monocytes Absolute 0.9 0.1 - 1.0 K/uL   Eosinophils Relative 0 %   Eosinophils Absolute 0.0 0.0 - 0.7 K/uL   Basophils Relative 0 %   Basophils Absolute 0.0 0.0 - 0.1 K/uL  Troponin I     Status: Abnormal   Collection Time: 04/13/15  3:21 PM  Result Value Ref Range   Troponin I 0.13 (H) <0.031 ng/mL    Comment:        PERSISTENTLY INCREASED TROPONIN VALUES IN THE RANGE OF 0.04-0.49 ng/mL CAN BE SEEN IN:       -UNSTABLE ANGINA       -CONGESTIVE HEART FAILURE       -MYOCARDITIS       -CHEST TRAUMA       -ARRYHTHMIAS       -LATE PRESENTING MYOCARDIAL INFARCTION       -COPD   CLINICAL FOLLOW-UP RECOMMENDED.   Culture, blood (routine x 2)     Status: None (Preliminary result)   Collection Time: 04/13/15  3:25 PM  Result Value Ref Range   Specimen Description BLOOD RIGHT ANTECUBITAL    Special Requests BOTTLES DRAWN AEROBIC ONLY 6CC    Culture  Setup Time      GRAM NEGATIVE RODS Gram Stain Report Called to,Read Back By and Verified With: BROWER, B AT 0925 ON 04/14/2015 BY WOODS, M     Culture NO GROWTH < 12 HOURS    Report Status PENDING   Lactic acid, plasma     Status: None   Collection Time: 04/13/15  3:25 PM  Result Value Ref Range   Lactic Acid, Venous 1.9 0.5 - 2.0 mmol/L  Culture, blood (routine x 2)     Status: None (Preliminary result)   Collection Time: 04/13/15  4:45 PM  Result Value Ref Range   Specimen Description BLOOD LEFT HAND    Special Requests      BOTTLES DRAWN AEROBIC AND ANAEROBIC AEB=8CC ANA=5CC   Culture  Setup Time      GRAM NEGATIVE RODS Gram Stain Report Called to,Read Back By and Verified With: PATTON, M AT 0715 ON 04/14/2015 BY WOODS, M GRAM NEGATIVE RODS Gram Stain Report Called to,Read Back By and Verified With: SEEN IN ANAROBIC BOTTLE CALLED TO [place], B AT [place] ON 04/14/15 BY WOODS,M    Culture PENDING    Report Status PENDING   Lactic acid, plasma     Status: Abnormal   Collection Time: 04/13/15  8:07 PM  Result Value Ref Range   Lactic Acid, Venous 2.1 (HH) 0.5 - 2.0 mmol/L    Comment: CRITICAL RESULT CALLED TO, READ BACK BY AND VERIFIED WITH: PATTEN,M AT 2053 ON 04/13/2015 BY ISLEY,B   Troponin I (q 6hr x 3)     Status: Abnormal  Collection Time: 04/13/15  8:07 PM  Result Value Ref Range   Troponin I 0.20 (H) <0.031 ng/mL    Comment:        PERSISTENTLY INCREASED TROPONIN VALUES IN THE RANGE OF 0.04-0.49 ng/mL CAN BE SEEN IN:       -UNSTABLE ANGINA       -CONGESTIVE HEART FAILURE       -MYOCARDITIS       -CHEST TRAUMA       -ARRYHTHMIAS       -LATE PRESENTING MYOCARDIAL INFARCTION       -COPD   CLINICAL FOLLOW-UP RECOMMENDED.    Recent Results (from the past 240 hour(s))  Urine culture     Status: None (Preliminary result)   Collection Time: 04/13/15  2:56 PM  Result Value Ref Range Status   Specimen Description URINE, CLEAN CATCH  Final   Special Requests NONE  Final   Culture   Final    CULTURE REINCUBATED FOR BETTER GROWTH Performed at [facility]    Report Status PENDING  Incomplete  Culture,  blood (routine x 2)     Status: None (Preliminary result)   Collection Time: 04/13/15  3:25 PM  Result Value Ref Range Status   Specimen Description BLOOD RIGHT ANTECUBITAL  Final   Special Requests BOTTLES DRAWN AEROBIC ONLY 6CC  Final   Culture  Setup Time   Final    GRAM NEGATIVE RODS Gram Stain Report Called to,Read Back By and Verified With: BROWER, B AT 0925 ON 04/14/2015 BY WOODS, M    Culture NO GROWTH < 12 HOURS  Final   Report Status PENDING  Incomplete  Culture, blood (routine x 2)     Status: None (Preliminary result)   Collection Time: 04/13/15  4:45 PM  Result Value Ref Range Status   Specimen Description BLOOD LEFT HAND  Final   Special Requests   Final    BOTTLES DRAWN AEROBIC AND ANAEROBIC AEB=8CC ANA=5CC   Culture  Setup Time   Final    GRAM NEGATIVE RODS Gram Stain Report Called to,Read Back By and Verified With: PATTON, M AT 0715 ON 04/14/2015 BY WOODS, M GRAM NEGATIVE RODS Gram Stain Report Called to,Read Back By and Verified With: SEEN IN ANAROBIC BOTTLE CALLED TO [place], B AT [place] ON 04/14/15 BY WOODS,M    Culture PENDING  Incomplete   Report Status PENDING  Incomplete   Creatinine:  Recent Labs  04/13/15 1521  CREATININE 1.11*   Baseline Creatinine: 1  Impression/Assessment:  78yo with right ureteral calculi, sepsis from a urinary source and troponin leak  Plan:  1. I discussed the various treatment strategies with the patient all of which involve decompression of her right collecting system.  We discussed ureteral stenting versus nephrostomy tube placement.  Her stone is distal which makes ureteral stenting difficult and often unsuccessful. The patient also does not want a stent due to the discomfort associated with the stent.  I would recommend urgent transfer to [facility] for Right nephrostomy tube placement.   Caitlin Mccarthy L 04/14/2015, 12:56 PM

## 2015-04-14 NOTE — Progress Notes (Addendum)
[facility] PROGRESS NOTE  Caitlin Mccarthy GXQ:[id number] DOB: Jul 31, 1935 DOA: 04/13/2015 PCP: [name], FNP  HPI/Brief narrative [phone] with prior hx of renal stones presented with increased weakness, R sided abd pain, and falls, found to have an obstructing R sided renal calculi and findings worrisome for UTI with sepsis. The patient was admitted for further work up.  Assessment/Plan: 1. Sepsis, present on admit, with UTI and gm neg bacteremia 1. UA suggestive of UTI, cultures pending 2. Blood culture pos for gm neg organisms 3. Pt with tachycardia, tachypnea, and leukocytosis 4. Pt was initially started on empiric rocephin. Given bacteremia, will change to zosyn 5. Follow lactate (last value at 2) 6. Cont hydration as tolerated 2. R sided obstructive renal calculi 1. Urology was consulted. Appreciate input 2. Essentially, pt would benefit from stenting vs nephrostomy tube. Urology has recommended transfer to [facility] for R sided nephrostomy tube placement by IR 3. Orders have been placed 3. Acute encephalopathy 1. Likely secondary to above sepsis/acute infection 2. Cont abx and supportive care 4. HTN 1. BP stable and controlled currently 5. Chronic pain 1. Family reports hx of chronic knee and back pain 2. Normally on tramadol prior to admit, however family is concerned that pt may be obtaining narcotics from outside sources 6. Elevated troponin 1. Mild troponin elevation from 0.13 to 0.2 2. Motion artifact on EKG, but no acute process noted 3. Pt denies chest pain 4. Strongly suspect troponin leak in the setting of florid sepsis 5. Would follow serial trop to ensure resolution 7. HLD 1. Pt is continued on fenofibrate and statin 8. DVT prophylaxis 1. Heparin subQ  UPDATE: Discussed with Urology and reviewed IR note. Now plans for procedure at [facility] on 10/15 with return to [facility] afterwards. Will d/c transfer orders to [facility]  Code Status: Full Family  Communication: Pt in room, family at bedside Disposition Plan: Pending   Consultants:  Urology  IR  Procedures:    Antibiotics: Anti-infectives    Start     Dose/Rate Route Frequency Ordered Stop   04/14/15 1800  piperacillin-tazobactam (ZOSYN) IVPB 3.375 g     3.375 g 12.5 mL/hr over 240 Minutes Intravenous Every 8 hours 04/14/15 1028     04/14/15 1600  cefTRIAXone (ROCEPHIN) 1 g in dextrose 5 % 50 mL IVPB  Status:  Discontinued     1 g 100 mL/hr over 30 Minutes Intravenous Every 24 hours 04/13/15 1917 04/14/15 0932   04/14/15 0945  piperacillin-tazobactam (ZOSYN) IVPB 3.375 g     3.375 g 100 mL/hr over 30 Minutes Intravenous  Once 04/14/15 0939 04/14/15 1108   04/13/15 1615  cefTRIAXone (ROCEPHIN) 1 g in dextrose 5 % 50 mL IVPB     1 g 100 mL/hr over 30 Minutes Intravenous  Once 04/13/15 1618 04/13/15 1846       HPI/Subjective: Complains of continued R sided flank pain. Still confused per family in room  Objective: Filed Vitals:   04/13/15 1845 04/13/15 2012 04/14/15 0509 04/14/15 0718  BP:  145/55 130/41   Pulse:  104 95   Temp:  98.6 F (37 C) 99.6 F (37.6 C) 99.6 F (37.6 C)  TempSrc:  Oral Oral Oral  Resp: 30 24 20    Height:  5\' 10"  (1.778 m)    Weight:  97.9 kg (215 lb 13.3 oz)    SpO2:  98% 93%     Intake/Output Summary (Last 24 hours) at 04/14/15 1331 Last data filed at 04/14/15 1317  Gross per 24 hour  Intake   1050 ml  Output    200 ml  Net    850 ml   Filed Weights   04/13/15 1300 04/13/15 2012  Weight: 102.967 kg (227 lb) 97.9 kg (215 lb 13.3 oz)    Exam:   General:  Awake, in nad  Cardiovascular: regular, s1, s2  Respiratory: normal resp effort, no wheezing  Abdomen: soft,nondistended  Musculoskeletal: perfused, no clubbing   Data Reviewed: Basic Metabolic Panel:  Recent Labs Lab 04/13/15 1521  NA 134*  K 3.8  CL 100*  CO2 26  GLUCOSE 203*  BUN 24*  CREATININE 1.11*  CALCIUM 10.5*   Liver Function  Tests:  Recent Labs Lab 04/13/15 1521  AST 32  ALT 24  ALKPHOS 52  BILITOT 1.1  PROT 7.9  ALBUMIN 3.5    Recent Labs Lab 04/13/15 1521  LIPASE 23   No results for input(s): AMMONIA in the last 168 hours. CBC:  Recent Labs Lab 04/13/15 1521  WBC 15.9*  NEUTROABS 14.0*  HGB 13.1  HCT 38.6  MCV 92.8  PLT 203   Cardiac Enzymes:  Recent Labs Lab 04/13/15 1521 04/13/15 2007  TROPONINI 0.13* 0.20*   BNP (last 3 results) No results for input(s): BNP in the last 8760 hours.  ProBNP (last 3 results) No results for input(s): PROBNP in the last 8760 hours.  CBG: No results for input(s): GLUCAP in the last 168 hours.  Recent Results (from the past 240 hour(s))  Urine culture     Status: None (Preliminary result)   Collection Time: 04/13/15  2:56 PM  Result Value Ref Range Status   Specimen Description URINE, CLEAN CATCH  Final   Special Requests NONE  Final   Culture   Final    CULTURE REINCUBATED FOR BETTER GROWTH Performed at [facility]    Report Status PENDING  Incomplete  Culture, blood (routine x 2)     Status: None (Preliminary result)   Collection Time: 04/13/15  3:25 PM  Result Value Ref Range Status   Specimen Description BLOOD RIGHT ANTECUBITAL  Final   Special Requests BOTTLES DRAWN AEROBIC ONLY 6CC  Final   Culture  Setup Time   Final    GRAM NEGATIVE RODS Gram Stain Report Called to,Read Back By and Verified With: BROWER, B AT 0925 ON 04/14/2015 BY WOODS, M    Culture NO GROWTH < 12 HOURS  Final   Report Status PENDING  Incomplete  Culture, blood (routine x 2)     Status: None (Preliminary result)   Collection Time: 04/13/15  4:45 PM  Result Value Ref Range Status   Specimen Description BLOOD LEFT HAND  Final   Special Requests   Final    BOTTLES DRAWN AEROBIC AND ANAEROBIC AEB=8CC ANA=5CC   Culture  Setup Time   Final    GRAM NEGATIVE RODS Gram Stain Report Called to,Read Back By and Verified With: PATTON, M AT 0715 ON 04/14/2015  BY WOODS, M GRAM NEGATIVE RODS Gram Stain Report Called to,Read Back By and Verified With: SEEN IN ANAROBIC BOTTLE CALLED TO [place], B AT [place] ON 04/14/15 BY WOODS,M    Culture PENDING  Incomplete   Report Status PENDING  Incomplete     Studies: Dg Chest 2 View  04/13/2015  CLINICAL DATA:  Weakness, multiple falls EXAM: CHEST  2 VIEW COMPARISON:  10/22/2011 FINDINGS: Cardiomediastinal silhouette is stable. No acute infiltrate or pleural effusion. No pulmonary edema. Thoracic spine osteopenia.  IMPRESSION: No active cardiopulmonary disease. Electronically Signed   By: Lahoma Crocker M.D.   On: 04/13/2015 16:53   Ct Head Wo Contrast  04/13/2015  CLINICAL DATA:  Altered mental status.  Hypertension. EXAM: CT HEAD WITHOUT CONTRAST TECHNIQUE: Contiguous axial images were obtained from the base of the skull through the vertex without intravenous contrast. COMPARISON:  None. FINDINGS: Study is limited due to extension motion artifact despite several attempts to image the entire intracranial contents. There is age related volume loss. No appreciable mass, hemorrhage, extra-axial fluid collection, or midline shift is appreciable on this limited study. There is mild small vessel disease in the centra semiovale bilaterally. No acute infarct is demonstrable on this study. Bony calvarium appears grossly intact. The mastoid air cells appear clear. IMPRESSION: Limited study due to patient motion. Age related volume loss with mild periventricular small vessel disease. No acute infarct is appreciable on this less than optimal study. No hemorrhage or mass effect apparent. Electronically Signed   By: Lowella Grip III M.D.   On: 04/13/2015 16:34   Ct Abdomen Pelvis W Contrast  04/13/2015  CLINICAL DATA:  79 year old female with acute abdominal and pelvic pain following fall 5 days ago. EXAM: CT ABDOMEN AND PELVIS WITH CONTRAST TECHNIQUE: Multidetector CT imaging of the abdomen and pelvis was performed using the  standard protocol following bolus administration of intravenous contrast. CONTRAST:  16mL OMNIPAQUE IOHEXOL 300 MG/ML  SOLN COMPARISON:  10/28/2011 pelvis radiographs FINDINGS: Lower chest:  Unremarkable Hepatobiliary: Hepatic steatosis identified without focal hepatic abnormality. The gallbladder is unremarkable. There is no evidence of biliary dilatation. Pancreas: Unremarkable Spleen: Unremarkable Adrenals/Urinary Tract: A 4 mm distal right ureteral calculus (1 cm above the right UVJ causes moderate right hydroureteronephrosis. Multiple nonobstructing left upper pole renal calculi are identified, the largest measuring 7 mm. Bilateral adrenal hyperplasia is identified. The bladder is unremarkable. Stomach/Bowel: There is no evidence of bowel obstruction or focal bowel wall thickening. The appendix is unremarkable. Vascular/Lymphatic: No enlarged lymph nodes identified. Aortic atherosclerotic calcification noted without aneurysm. Reproductive: Uterus and adnexal regions are within normal limits. Other: No free fluid, pneumoperitoneum or abscess. 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 No acute or suspicious abnormalities are identified. Moderate degenerative changes within the lumbar spine identified. IMPRESSION: Obstructing 4 mm distal right ureteral calculus (1 cm above the right UVJ) causing moderate right hydroureteronephrosis. Nonobstructing left renal calculi, hepatic steatosis and abdominal aortic atherosclerosis. Electronically Signed   By: Margarette Canada M.D.   On: 04/13/2015 16:38   Dg Hip Unilat With Pelvis 2-3 Views Right  04/13/2015  CLINICAL DATA:  Pain following fall 5 days prior EXAM: DG HIP (WITH OR WITHOUT PELVIS) 2-3V RIGHT COMPARISON:  October 28, 2011 FINDINGS: Frontal pelvis as well as frontal and lateral right hip images were obtained. There is a total hip prosthesis on the right with prosthetic components appearing well-seated. No  acute fracture or dislocation. Left hip joint appears unremarkable. There is lower lumbar levoscoliosis. IMPRESSION: No fracture or dislocation. Total hip prosthetic components on the right appear well seated. Electronically Signed   By: Lowella Grip III M.D.   On: 04/13/2015 14:10    Scheduled Meds: . amLODipine  10 mg Oral Daily   And  . benazepril  40 mg Oral Daily  . aspirin  81 mg Oral Daily  . fenofibrate  160 mg Oral Daily  . heparin  5,000 Units Subcutaneous 3 times per day  . hydrochlorothiazide  25  mg Oral q morning - 10a  . nicotine  14 mg Transdermal Daily  . pantoprazole  80 mg Oral Daily  . piperacillin-tazobactam (ZOSYN)  IV  3.375 g Intravenous Q8H  . rosuvastatin  20 mg Oral QHS  . sodium chloride  500 mL Intravenous Once  . sodium chloride  3 mL Intravenous Q12H  . tamsulosin  0.4 mg Oral Daily  . traMADol  50 mg Oral TID   Continuous Infusions: . sodium chloride    . sodium chloride 100 mL/hr at 04/14/15 0312    Principal Problem:   Acute encephalopathy Active Problems:   Essential hypertension, benign   GERD (gastroesophageal reflux disease)   UTI (lower urinary tract infection)   Ureteral stone with hydronephrosis   Chronic pain   [facility], [facility] Pager [phone]. If 7PM-7AM, please contact night-coverage at [email or web address], password [facility] 04/14/2015, 1:31 PM  LOS: 1 day

## 2015-04-15 ENCOUNTER — Ambulatory Visit (HOSPITAL_COMMUNITY)
Admission: RE | Admit: 2015-04-15 | Discharge: 2015-04-15 | Disposition: A | Discharge status: Home / Self Care | Payer: Commercial Managed Care - HMO | Source: Ambulatory Visit | Attending: Urology | Admitting: Urology

## 2015-04-15 DIAGNOSIS — I10 Essential (primary) hypertension: Secondary | ICD-10-CM | POA: Insufficient documentation

## 2015-04-15 DIAGNOSIS — Z96653 Presence of artificial knee joint, bilateral: Secondary | ICD-10-CM

## 2015-04-15 DIAGNOSIS — F17210 Nicotine dependence, cigarettes, uncomplicated: Secondary | ICD-10-CM

## 2015-04-15 DIAGNOSIS — N132 Hydronephrosis with renal and ureteral calculous obstruction: Secondary | ICD-10-CM | POA: Diagnosis not present

## 2015-04-15 DIAGNOSIS — I129 Hypertensive chronic kidney disease with stage 1 through stage 4 chronic kidney disease, or unspecified chronic kidney disease: Secondary | ICD-10-CM | POA: Insufficient documentation

## 2015-04-15 DIAGNOSIS — N189 Chronic kidney disease, unspecified: Secondary | ICD-10-CM | POA: Insufficient documentation

## 2015-04-15 DIAGNOSIS — Z7982 Long term (current) use of aspirin: Secondary | ICD-10-CM | POA: Insufficient documentation

## 2015-04-15 DIAGNOSIS — N135 Crossing vessel and stricture of ureter without hydronephrosis: Secondary | ICD-10-CM | POA: Insufficient documentation

## 2015-04-15 LAB — BASIC METABOLIC PANEL
ANION GAP: 7 (ref 5–15)
BUN: 26 mg/dL — ABNORMAL HIGH (ref 6–20)
CALCIUM: 9.9 mg/dL (ref 8.9–10.3)
CHLORIDE: 104 mmol/L (ref 101–111)
CO2: 25 mmol/L (ref 22–32)
Creatinine, Ser: 0.86 mg/dL (ref 0.44–1.00)
GFR calc non Af Amer: >60 mL/min (ref >60)
GLUCOSE: 147 mg/dL — AB (ref 65–99)
POTASSIUM: 3.9 mmol/L (ref 3.5–5.1)
Sodium: 136 mmol/L (ref 135–145)

## 2015-04-15 LAB — URINE CULTURE

## 2015-04-15 LAB — CBC
HEMATOCRIT: 32.4 % — AB (ref 36.0–46.0)
HEMOGLOBIN: 10.9 g/dL — AB (ref 12.0–15.0)
MCH: 30.9 pg (ref 26.0–34.0)
MCHC: 33.6 g/dL (ref 30.0–36.0)
MCV: 91.8 fL (ref 78.0–100.0)
Platelets: 185 10*3/uL (ref 150–400)
RBC: 3.53 MIL/uL — AB (ref 3.87–5.11)
RDW: 13.1 % (ref 11.5–15.5)
WBC: 14.8 10*3/uL — ABNORMAL HIGH (ref 4.0–10.5)

## 2015-04-15 LAB — TROPONIN I: Troponin I: 0.11 ng/mL — ABNORMAL HIGH (ref <0.031)

## 2015-04-15 LAB — GLUCOSE, CAPILLARY: Glucose-Capillary: 130 mg/dL — ABNORMAL HIGH (ref 65–99)

## 2015-04-15 MED ORDER — CEFAZOLIN SODIUM-DEXTROSE 2-3 GM-% IV SOLR
INTRAVENOUS | Status: AC
  Administered 2015-04-15: 2 g via INTRAVENOUS
  Filled 2015-04-15: qty 50

## 2015-04-15 MED ORDER — LORAZEPAM 2 MG/ML IJ SOLN
INTRAMUSCULAR | Status: AC | PRN
  Administered 2015-04-15: 1 mg via INTRAVENOUS

## 2015-04-15 MED ORDER — FENTANYL CITRATE (PF) 100 MCG/2ML IJ SOLN
INTRAMUSCULAR | Status: AC
  Filled 2015-04-15: qty 2

## 2015-04-15 MED ORDER — IOHEXOL 300 MG/ML  SOLN
50.0000 mL | Freq: Once | INTRAMUSCULAR | Status: DC | PRN
  Administered 2015-04-15: 20 mL via INTRAVENOUS
  Filled 2015-04-15: qty 50

## 2015-04-15 MED ORDER — CEFAZOLIN SODIUM-DEXTROSE 2-3 GM-% IV SOLR: 2.0000 g | Freq: Three times a day (TID) | INTRAVENOUS | Status: DC

## 2015-04-15 MED ORDER — MIDAZOLAM HCL 2 MG/2ML IJ SOLN
INTRAMUSCULAR | Status: AC
  Filled 2015-04-15: qty 2

## 2015-04-15 MED ORDER — MIDAZOLAM HCL 2 MG/2ML IJ SOLN
INTRAMUSCULAR | Status: AC | PRN
  Administered 2015-04-15: 1 mg via INTRAVENOUS

## 2015-04-15 MED ORDER — CEFAZOLIN SODIUM-DEXTROSE 2-3 GM-% IV SOLR
2.0000 g | Freq: Once | INTRAVENOUS | Status: AC
  Administered 2015-04-15: 2 g via INTRAVENOUS

## 2015-04-15 MED ORDER — FENTANYL CITRATE (PF) 100 MCG/2ML IJ SOLN
INTRAMUSCULAR | Status: AC | PRN
  Administered 2015-04-15 (×2): 50 ug via INTRAVENOUS

## 2015-04-15 MED ORDER — LIDOCAINE HCL 1 % IJ SOLN
INTRAMUSCULAR | Status: AC
  Filled 2015-04-15: qty 20

## 2015-04-15 NOTE — Progress Notes (Signed)
Pt. To Caitlin Mccarthy for procedure via [facility] at [place].

## 2015-04-15 NOTE — Progress Notes (Signed)
S/p tube today Feels good Send home with po antibioitcs when appropriate Dr Crissie Sickles will arrange definitive stone care

## 2015-04-15 NOTE — Consult Note (Signed)
Chief Complaint: Patient was seen in consultation today for No chief complaint on file.  at the request of [place]  Referring Physician(s): McKenzie,Patrick L  History of Present Illness: Caitlin Mccarthy is a 79 y.o. female with right ureteral obstruction and hydronephrosis secondary to a distal calculus. Right PCN and eventual stent requested. She has no complaints other than right flank pain. She denies chills.  Past Medical History  Diagnosis Date  . Hypertension   . Hypercholesteremia   . Chronic kidney disease     hx of kidney stones  . Anxiety   . Depression   . Arthritis   . Shortness of breath     denies at preop of 10/22/11/  . GERD (gastroesophageal reflux disease)   . Nephrolithiasis     Past Surgical History  Procedure Laterality Date  . Cystoscopy w/ ureteroscopy w/ lithotripsy  80's  . Knee arthroscopy      bilateral  . Replacement total knee bilateral    . Joint replacement      bilateral knees  . Cataract extraction w/phaco  06/06/2011    Procedure: CATARACT EXTRACTION PHACO AND INTRAOCULAR LENS PLACEMENT (IOC);  Surgeon: Tonny Branch;  Location: [facility];  Service: Ophthalmology;  Laterality: Right;  CDE=12.77  . Cataract extraction w/phaco  06/27/2011    Procedure: CATARACT EXTRACTION PHACO AND INTRAOCULAR LENS PLACEMENT (IOC);  Surgeon: Tonny Branch;  Location: [facility];  Service: Ophthalmology;  Laterality: Left;  CDE:13.96  . Total hip arthroplasty  10/28/2011    Procedure: TOTAL HIP ARTHROPLASTY;  Surgeon: Gearlean Alf, MD;  Location: [facility];  Service: Orthopedics;  Laterality: Right;    Allergies: Review of patient's allergies indicates no known allergies.  Medications: Prior to Admission medications   Medication Sig Start Date End Date Taking? Authorizing Provider  albuterol (PROVENTIL HFA;VENTOLIN HFA) 108 (90 BASE) MCG/ACT inhaler Inhale 2 puffs into the lungs every 6 (six) hours as needed for wheezing or shortness of  breath. Patient not taking: Reported on 02/24/2015 10/26/14   Tiffany A Gann, PA-C  amLODipine-benazepril (LOTREL) 10-40 MG per capsule TAKE ONE CAPSULE EACH MORNING 02/24/15   Mary-Margaret Hassell Done, FNP  aspirin 81 MG tablet Take 81 mg by mouth daily.    Historical Provider, MD  cetirizine (ZYRTEC) 10 MG tablet Take 1 tablet (10 mg total) by mouth daily. 10/26/14   Tiffany A Gann, PA-C  cyclobenzaprine (FLEXERIL) 5 MG tablet TAKE ONE TABLET BY MOUTH THREE TIMES DAILY AS NEEDED FOR MUSCLE SPASM 06/15/14   Mary-Margaret Hassell Done, FNP  fenofibrate 160 MG tablet Take 160 mg by mouth daily.  02/25/15   Historical Provider, MD  fenofibrate micronized (ANTARA) 130 MG capsule Take 1 capsule (130 mg total) by mouth daily before breakfast. Patient not taking: Reported on 04/13/2015 02/25/15   Mary-Margaret Hassell Done, FNP  hydrochlorothiazide (HYDRODIURIL) 25 MG tablet Take 1 tablet (25 mg total) by mouth every morning. 02/24/15   Mary-Margaret Hassell Done, FNP  omeprazole (PRILOSEC) 40 MG capsule Take 1 capsule (40 mg total) by mouth daily. 02/24/15   Mary-Margaret Hassell Done, FNP  rosuvastatin (CRESTOR) 20 MG tablet Take 1 tablet (20 mg total) by mouth at bedtime. 02/24/15   Mary-Margaret Hassell Done, FNP  traMADol (ULTRAM) 50 MG tablet Take 1 tablet (50 mg total) by mouth 3 (three) times daily. 03/01/15   Mary-Margaret Hassell Done, FNP     Family History  Problem Relation Age of Onset  . Anesthesia problems Neg Hx   . Hypotension Neg Hx   .  Malignant hyperthermia Neg Hx   . Pseudochol deficiency Neg Hx   . Kidney disease Mother   . Congestive Heart Failure Father   . Heart disease Brother   . Alcohol abuse Brother     Social History   Social History  . Marital Status: Married    Spouse Name: N/A  . Number of Children: N/A  . Years of Education: N/A   Social History Main Topics  . Smoking status: Current Every Day Smoker -- 0.50 packs/day for 30 years    Types: Cigarettes  . Smokeless tobacco: Never Used  . Alcohol Use:  No  . Drug Use: No  . Sexual Activity: Yes    Birth Control/ Protection: Post-menopausal   Other Topics Concern  . Not on file   Social History Narrative     Review of Systems: A 12 point ROS discussed and pertinent positives are indicated in the HPI above.  All other systems are negative.  Review of Systems  Vital Signs: BP 139/61 mmHg  Pulse 58  Resp 22  SpO2 100%  Physical Exam  Constitutional: She is oriented to person, place, and time. She appears well-developed and well-nourished.  Cardiovascular: Normal rate and regular rhythm.   Pulmonary/Chest: Effort normal and breath sounds normal.  Neurological: She is alert and oriented to person, place, and time.    Mallampati Score:   2  Imaging: Dg Chest 2 View  04/13/2015  CLINICAL DATA:  Weakness, multiple falls EXAM: CHEST  2 VIEW COMPARISON:  10/22/2011 FINDINGS: Cardiomediastinal silhouette is stable. No acute infiltrate or pleural effusion. No pulmonary edema. Thoracic spine osteopenia. IMPRESSION: No active cardiopulmonary disease. Electronically Signed   By: Lahoma Crocker M.D.   On: 04/13/2015 16:53   Ct Head Wo Contrast  04/13/2015  CLINICAL DATA:  Altered mental status.  Hypertension. EXAM: CT HEAD WITHOUT CONTRAST TECHNIQUE: Contiguous axial images were obtained from the base of the skull through the vertex without intravenous contrast. COMPARISON:  None. FINDINGS: Study is limited due to extension motion artifact despite several attempts to image the entire intracranial contents. There is age related volume loss. No appreciable mass, hemorrhage, extra-axial fluid collection, or midline shift is appreciable on this limited study. There is mild small vessel disease in the centra semiovale bilaterally. No acute infarct is demonstrable on this study. Bony calvarium appears grossly intact. The mastoid air cells appear clear. IMPRESSION: Limited study due to patient motion. Age related volume loss with mild periventricular  small vessel disease. No acute infarct is appreciable on this less than optimal study. No hemorrhage or mass effect apparent. Electronically Signed   By: Lowella Grip III M.D.   On: 04/13/2015 16:34   Ct Abdomen Pelvis W Contrast  04/13/2015  CLINICAL DATA:  79 year old female with acute abdominal and pelvic pain following fall 5 days ago. EXAM: CT ABDOMEN AND PELVIS WITH CONTRAST TECHNIQUE: Multidetector CT imaging of the abdomen and pelvis was performed using the standard protocol following bolus administration of intravenous contrast. CONTRAST:  187mL OMNIPAQUE IOHEXOL 300 MG/ML  SOLN COMPARISON:  10/28/2011 pelvis radiographs FINDINGS: Lower chest:  Unremarkable Hepatobiliary: Hepatic steatosis identified without focal hepatic abnormality. The gallbladder is unremarkable. There is no evidence of biliary dilatation. Pancreas: Unremarkable Spleen: Unremarkable Adrenals/Urinary Tract: A 4 mm distal right ureteral calculus (1 cm above the right UVJ causes moderate right hydroureteronephrosis. Multiple nonobstructing left upper pole renal calculi are identified, the largest measuring 7 mm. Bilateral adrenal hyperplasia is identified. The bladder is unremarkable. Stomach/Bowel: There  is no evidence of bowel obstruction or focal bowel wall thickening. The appendix is unremarkable. Vascular/Lymphatic: No enlarged lymph nodes identified. Aortic atherosclerotic calcification noted without aneurysm. Reproductive: Uterus and adnexal regions are within normal limits. Other: No free fluid, pneumoperitoneum or abscess. 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 No acute or suspicious abnormalities are identified. Moderate degenerative changes within the lumbar spine identified. IMPRESSION: Obstructing 4 mm distal right ureteral calculus (1 cm above the right UVJ) causing moderate right hydroureteronephrosis. Nonobstructing left renal calculi, hepatic steatosis  and abdominal aortic atherosclerosis. Electronically Signed   By: Margarette Canada M.D.   On: 04/13/2015 16:38   Dg Hip Unilat With Pelvis 2-3 Views Right  04/13/2015  CLINICAL DATA:  Pain following fall 5 days prior EXAM: DG HIP (WITH OR WITHOUT PELVIS) 2-3V RIGHT COMPARISON:  October 28, 2011 FINDINGS: Frontal pelvis as well as frontal and lateral right hip images were obtained. There is a total hip prosthesis on the right with prosthetic components appearing well-seated. No acute fracture or dislocation. Left hip joint appears unremarkable. There is lower lumbar levoscoliosis. IMPRESSION: No fracture or dislocation. Total hip prosthetic components on the right appear well seated. Electronically Signed   By: Lowella Grip III M.D.   On: 04/13/2015 14:10    Labs:  CBC:  Recent Labs  04/13/15 1521 04/15/15 0616  WBC 15.9* 14.8*  HGB 13.1 10.9*  HCT 38.6 32.4*  PLT 203 185    COAGS:  Recent Labs  04/14/15 1412  INR 1.35  APTT 40*    BMP:  Recent Labs  11/24/14 1126 02/24/15 1231 04/13/15 1521 04/15/15 0616  NA 137 139 134* 136  K 4.2 4.6 3.8 3.9  CL 96* 99 100* 104  CO2 27 26 26 25   GLUCOSE 127* 130* 203* 147*  BUN 16 17 24* 26*  CALCIUM 11.5* 11.1* 10.5* 9.9  CREATININE 0.59 0.67 1.11* 0.86  GFRNONAA 89 86 47* >60  GFRAA 103 99 54* >60    LIVER FUNCTION TESTS:  Recent Labs  08/26/14 1545 11/24/14 1126 02/24/15 1231 04/13/15 1521  BILITOT 0.3 0.5 0.3 1.1  AST 24 35 28 32  ALT 24 29 29 24   ALKPHOS 71 62 60 52  PROT 7.6 8.1 7.7 7.9  ALBUMIN 4.5 4.4 4.7 3.5    TUMOR MARKERS: No results for input(s): AFPTM, CEA, CA199, CHROMGRNA in the last 8760 hours.  Assessment and Plan:  Right ureteral obstruction. For right PCN today.  Thank you for this interesting consult.  I greatly enjoyed meeting [place] and look forward to participating in their care.  A copy of this report was sent to the requesting provider on this date.  Signed: Maleek Craver, ART  A 04/15/2015, 9:47 AM   I spent a total of 40 Minutes  in face to face in clinical consultation, greater than 50% of which was counseling/coordinating care for nephrostomy.

## 2015-04-15 NOTE — Procedures (Signed)
R PCN Clear urine No comp/EBL

## 2015-04-15 NOTE — Sedation Documentation (Signed)
Patient denies pain and is resting comfortably.  

## 2015-04-15 NOTE — Progress Notes (Signed)
Pt. Returned from [facility]. Nephrostomy tube intact to right flank. Sero-sanguinous drainage in bag. Denies pain at present.

## 2015-04-15 NOTE — Progress Notes (Signed)
[facility] PROGRESS NOTE  Caitlin Mccarthy JAS:[id number] DOB: 04-10-1936 DOA: 04/13/2015 PCP: [name], FNP  HPI/Brief narrative [phone] with prior hx of renal stones presented with increased weakness, R sided abd pain, and falls, found to have an obstructing R sided renal calculi and findings worrisome for UTI with sepsis. The patient was admitted for further work up.  Assessment/Plan: 1. Sepsis, present on admit, with UTI and gm neg bacteremia 1. UA suggestive of UTI, cultures pending 2. Blood culture pos for gm neg organisms 3. Pt with tachycardia, tachypnea, and leukocytosis 4. Pt was initially started on empiric rocephin. Given bacteremia, this was changed to zosyn 5. Pt has shown clinical improvement overnight, afebrile with improving leukocytosis 2. R sided obstructive renal calculi 1. Urology was consulted. Appreciate input 2. Pt is now s/p perc nephrostomy tube per IR and doing well. Urology recs for d/c when transitioned to PO abx and definitive stone management as outpatient by Dr. Alyson Ingles 3. Acute encephalopathy 1. Likely secondary to above sepsis/acute infection 2. Much improved/resolved 4. HTN 1. BP stable and controlled currently 5. Chronic pain 1. Family reports hx of chronic knee and back pain 2. Normally on tramadol prior to admit, however family is concerned that pt may be obtaining narcotics from outside sources 3. Stable thus far 6. Elevated troponin 1. Mild troponin elevation from 0.13 to 0.2 2. Motion artifact on EKG, but no acute process noted 3. Pt continues to deny chest pain 4. Strongly suspect troponin leak in the setting of florid sepsis 5. Would follow serial trop to ensure resolution 7. HLD 1. Pt is continued on fenofibrate and statin 8. DVT prophylaxis 1. Heparin subQ  Code Status: Full Family Communication: Pt in room, family at bedside Disposition Plan: Home when tolerating PO  abx   Consultants:  Urology  IR  Procedures:  R sided perc nephrostomy tube  Antibiotics: Anti-infectives    Start     Dose/Rate Route Frequency Ordered Stop   04/14/15 1800  piperacillin-tazobactam (ZOSYN) IVPB 3.375 g     3.375 g 12.5 mL/hr over 240 Minutes Intravenous Every 8 hours 04/14/15 1028     04/14/15 1600  cefTRIAXone (ROCEPHIN) 1 g in dextrose 5 % 50 mL IVPB  Status:  Discontinued     1 g 100 mL/hr over 30 Minutes Intravenous Every 24 hours 04/13/15 1917 04/14/15 0932   04/14/15 0945  piperacillin-tazobactam (ZOSYN) IVPB 3.375 g     3.375 g 100 mL/hr over 30 Minutes Intravenous  Once 04/14/15 0939 04/14/15 1108   04/13/15 1615  cefTRIAXone (ROCEPHIN) 1 g in dextrose 5 % 50 mL IVPB     1 g 100 mL/hr over 30 Minutes Intravenous  Once 04/13/15 1618 04/13/15 1846      HPI/Subjective: Feels much improved today. Complains of mild post-procedural soreness  Objective: Filed Vitals:   04/14/15 1457 04/14/15 2200 04/15/15 0505 04/15/15 1138  BP: 111/38 114/47 131/52 125/49  Pulse: 78 96 92 78  Temp: 98.3 F (36.8 C) 99.8 F (37.7 C) 98.3 F (36.8 C) 98.1 F (36.7 C)  TempSrc: Oral Oral Oral Oral  Resp: 20 18 19 18   Height:      Weight:      SpO2: 95% 93% 94% 97%    Intake/Output Summary (Last 24 hours) at 04/15/15 1223 Last data filed at 04/15/15 0800  Gross per 24 hour  Intake   1850 ml  Output   1600 ml  Net    250 ml   Danley Danker  Weights   04/13/15 1300 04/13/15 2012  Weight: 102.967 kg (227 lb) 97.9 kg (215 lb 13.3 oz)    Exam:   General:  Awake, in nad  Cardiovascular: regular, s1, s2  Respiratory: normal resp effort, no wheezing  Abdomen: soft,nondistended, R perc nephrostomy tube in place  Musculoskeletal: perfused, no clubbing, no cyanosis  Data Reviewed: Basic Metabolic Panel:  Recent Labs Lab 04/13/15 1521 04/15/15 0616  NA 134* 136  K 3.8 3.9  CL 100* 104  CO2 26 25  GLUCOSE 203* 147*  BUN 24* 26*  CREATININE 1.11* 0.86   CALCIUM 10.5* 9.9   Liver Function Tests:  Recent Labs Lab 04/13/15 1521  AST 32  ALT 24  ALKPHOS 52  BILITOT 1.1  PROT 7.9  ALBUMIN 3.5    Recent Labs Lab 04/13/15 1521  LIPASE 23   No results for input(s): AMMONIA in the last 168 hours. CBC:  Recent Labs Lab 04/13/15 1521 04/15/15 0616  WBC 15.9* 14.8*  NEUTROABS 14.0*  --   HGB 13.1 10.9*  HCT 38.6 32.4*  MCV 92.8 91.8  PLT 203 185   Cardiac Enzymes:  Recent Labs Lab 04/13/15 1521 04/13/15 2007  TROPONINI 0.13* 0.20*   BNP (last 3 results) No results for input(s): BNP in the last 8760 hours.  ProBNP (last 3 results) No results for input(s): PROBNP in the last 8760 hours.  CBG:  Recent Labs Lab 04/15/15 0017  GLUCAP 130*    Recent Results (from the past 240 hour(s))  Urine culture     Status: None   Collection Time: 04/13/15  2:56 PM  Result Value Ref Range Status   Specimen Description URINE, CLEAN CATCH  Final   Special Requests NONE  Final   Culture   Final    MULTIPLE SPECIES PRESENT, SUGGEST RECOLLECTION Performed at [facility]    Report Status 04/15/2015 FINAL  Final  Culture, blood (routine x 2)     Status: None (Preliminary result)   Collection Time: 04/13/15  3:25 PM  Result Value Ref Range Status   Specimen Description BLOOD RIGHT ANTECUBITAL  Final   Special Requests BOTTLES DRAWN AEROBIC ONLY 6CC  Final   Culture  Setup Time   Final    GRAM NEGATIVE RODS Gram Stain Report Called to,Read Back By and Verified With: BROWER, B AT 0925 ON 04/14/2015 BY WOODS, M    Culture NO GROWTH 2 DAYS  Final   Report Status PENDING  Incomplete  Culture, blood (routine x 2)     Status: None (Preliminary result)   Collection Time: 04/13/15  4:45 PM  Result Value Ref Range Status   Specimen Description BLOOD LEFT HAND  Final   Special Requests   Final    BOTTLES DRAWN AEROBIC AND ANAEROBIC AEB=8CC ANA=5CC   Culture  Setup Time   Final    GRAM NEGATIVE RODS Gram Stain Report  Called to,Read Back By and Verified With: PATTON, M AT 0715 ON 04/14/2015 BY WOODS, M GRAM NEGATIVE RODS Gram Stain Report Called to,Read Back By and Verified With: SEEN IN ANAROBIC BOTTLE CALLED TO [place], B AT [place] ON 04/14/15 BY WOODS,M    Culture NO GROWTH 2 DAYS  Final   Report Status PENDING  Incomplete     Studies: Dg Chest 2 View  04/13/2015  CLINICAL DATA:  Weakness, multiple falls EXAM: CHEST  2 VIEW COMPARISON:  10/22/2011 FINDINGS: Cardiomediastinal silhouette is stable. No acute infiltrate or pleural effusion. No pulmonary edema. Thoracic  spine osteopenia. IMPRESSION: No active cardiopulmonary disease. Electronically Signed   By: Lahoma Crocker M.D.   On: 04/13/2015 16:53   Ct Head Wo Contrast  04/13/2015  CLINICAL DATA:  Altered mental status.  Hypertension. EXAM: CT HEAD WITHOUT CONTRAST TECHNIQUE: Contiguous axial images were obtained from the base of the skull through the vertex without intravenous contrast. COMPARISON:  None. FINDINGS: Study is limited due to extension motion artifact despite several attempts to image the entire intracranial contents. There is age related volume loss. No appreciable mass, hemorrhage, extra-axial fluid collection, or midline shift is appreciable on this limited study. There is mild small vessel disease in the centra semiovale bilaterally. No acute infarct is demonstrable on this study. Bony calvarium appears grossly intact. The mastoid air cells appear clear. IMPRESSION: Limited study due to patient motion. Age related volume loss with mild periventricular small vessel disease. No acute infarct is appreciable on this less than optimal study. No hemorrhage or mass effect apparent. Electronically Signed   By: Lowella Grip III M.D.   On: 04/13/2015 16:34   Ct Abdomen Pelvis W Contrast  04/13/2015  CLINICAL DATA:  79 year old female with acute abdominal and pelvic pain following fall 5 days ago. EXAM: CT ABDOMEN AND PELVIS WITH CONTRAST TECHNIQUE:  Multidetector CT imaging of the abdomen and pelvis was performed using the standard protocol following bolus administration of intravenous contrast. CONTRAST:  176mL OMNIPAQUE IOHEXOL 300 MG/ML  SOLN COMPARISON:  10/28/2011 pelvis radiographs FINDINGS: Lower chest:  Unremarkable Hepatobiliary: Hepatic steatosis identified without focal hepatic abnormality. The gallbladder is unremarkable. There is no evidence of biliary dilatation. Pancreas: Unremarkable Spleen: Unremarkable Adrenals/Urinary Tract: A 4 mm distal right ureteral calculus (1 cm above the right UVJ causes moderate right hydroureteronephrosis. Multiple nonobstructing left upper pole renal calculi are identified, the largest measuring 7 mm. Bilateral adrenal hyperplasia is identified. The bladder is unremarkable. Stomach/Bowel: There is no evidence of bowel obstruction or focal bowel wall thickening. The appendix is unremarkable. Vascular/Lymphatic: No enlarged lymph nodes identified. Aortic atherosclerotic calcification noted without aneurysm. Reproductive: Uterus and adnexal regions are within normal limits. Other: No free fluid, pneumoperitoneum or abscess. 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 No acute or suspicious abnormalities are identified. Moderate degenerative changes within the lumbar spine identified. IMPRESSION: Obstructing 4 mm distal right ureteral calculus (1 cm above the right UVJ) causing moderate right hydroureteronephrosis. Nonobstructing left renal calculi, hepatic steatosis and abdominal aortic atherosclerosis. Electronically Signed   By: Margarette Canada M.D.   On: 04/13/2015 16:38   Dg Hip Unilat With Pelvis 2-3 Views Right  04/13/2015  CLINICAL DATA:  Pain following fall 5 days prior EXAM: DG HIP (WITH OR WITHOUT PELVIS) 2-3V RIGHT COMPARISON:  October 28, 2011 FINDINGS: Frontal pelvis as well as frontal and lateral right hip images were obtained. There is a total hip  prosthesis on the right with prosthetic components appearing well-seated. No acute fracture or dislocation. Left hip joint appears unremarkable. There is lower lumbar levoscoliosis. IMPRESSION: No fracture or dislocation. Total hip prosthetic components on the right appear well seated. Electronically Signed   By: Lowella Grip III M.D.   On: 04/13/2015 14:10    Scheduled Meds: . amLODipine  10 mg Oral Daily   And  . benazepril  40 mg Oral Daily  . aspirin  81 mg Oral Daily  . fenofibrate  160 mg Oral Daily  . [START ON 04/16/2015] heparin  5,000 Units Subcutaneous 3 times per day  .  hydrochlorothiazide  25 mg Oral q morning - 10a  . nicotine  14 mg Transdermal Daily  . pantoprazole  80 mg Oral Daily  . piperacillin-tazobactam (ZOSYN)  IV  3.375 g Intravenous Q8H  . rosuvastatin  20 mg Oral QHS  . sodium chloride  500 mL Intravenous Once  . sodium chloride  3 mL Intravenous Q12H  . tamsulosin  0.4 mg Oral Daily  . traMADol  50 mg Oral TID   Continuous Infusions: . sodium chloride 100 mL/hr at 04/14/15 1423  . sodium chloride 100 mL/hr at 04/15/15 0938    Principal Problem:   Acute encephalopathy Active Problems:   Essential hypertension, benign   GERD (gastroesophageal reflux disease)   UTI (lower urinary tract infection)   Ureteral stone with hydronephrosis   Chronic pain   Ethan Clayburn, [facility] Pager [phone]. If 7PM-7AM, please contact night-coverage at [email or web address], password [facility] 04/15/2015, 12:23 PM  LOS: 2 days

## 2015-04-16 DIAGNOSIS — G8929 Other chronic pain: Secondary | ICD-10-CM

## 2015-04-16 DIAGNOSIS — A4150 Gram-negative sepsis, unspecified: Secondary | ICD-10-CM | POA: Diagnosis present

## 2015-04-16 LAB — CULTURE, BLOOD (ROUTINE X 2)

## 2015-04-16 LAB — CBC
HCT: 32.6 % — ABNORMAL LOW (ref 36.0–46.0)
HEMOGLOBIN: 11.2 g/dL — AB (ref 12.0–15.0)
MCH: 31.5 pg (ref 26.0–34.0)
MCHC: 34.4 g/dL (ref 30.0–36.0)
MCV: 91.6 fL (ref 78.0–100.0)
PLATELETS: 213 10*3/uL (ref 150–400)
RBC: 3.56 MIL/uL — AB (ref 3.87–5.11)
RDW: 13.1 % (ref 11.5–15.5)
WBC: 12.1 10*3/uL — ABNORMAL HIGH (ref 4.0–10.5)

## 2015-04-16 LAB — LACTIC ACID, PLASMA: Lactic Acid, Venous: 0.9 mmol/L (ref 0.5–2.0)

## 2015-04-16 NOTE — Progress Notes (Signed)
[facility] PROGRESS NOTE  Caitlin Mccarthy PIR:[id number] DOB: 1936-05-01 DOA: 04/13/2015 PCP: [name], FNP  HPI/Brief narrative [phone] with prior hx of renal stones presented with increased weakness, R sided abd pain, and falls, found to have an obstructing R sided renal calculi and findings worrisome for UTI with sepsis. The patient was admitted for further work up.  Assessment/Plan: 1. Sepsis, present on admit, with UTI and gm neg bacteremia 1. UA suggestive of UTI, cultures pending 2. Blood culture pos for gm neg organisms, follow up further identification 3. Pt with tachycardia, tachypnea, and leukocytosis 4. Pt was initially started on empiric rocephin. Given bacteremia, this was changed to zosyn 5. Pt has shown clinical improvement overnight, afebrile with improving leukocytosis 6. Anticipate she can transition to oral antibiotics once identification on blood cultures is obtained. Would treat for 14 days given bacteremia. 2. R sided obstructive renal calculi 1. Urology was consulted and patient evaluated by Dr. Nicolette Bang. Appreciate input 2. Pt is now s/p perc nephrostomy tube per IR and doing well. Urology recs for d/c when transitioned to PO abx and definitive stone management as outpatient by Dr. Alyson Ingles  3. She will need to be discharged with nephrostomy in place and will need home health for nephrostomy care. 3. Acute encephalopathy 1. Likely secondary to above sepsis/acute infection 2. Much improved/resolved 4. HTN 1. BP stable and controlled currently 5. Chronic pain 1. Family reports hx of chronic knee and back pain 2. Normally on tramadol prior to admit, however family is concerned that pt may be obtaining narcotics from outside sources 3. Stable thus far 6. Elevated troponin 1. Mild troponin elevation from 0.13 to 0.2 2. Motion artifact on EKG, but no acute process noted 3. Pt continues to deny chest pain 4. Strongly suspect troponin leak  in the setting of florid sepsis 5. Would follow serial trop to ensure resolution 7. HLD 1. Pt is continued on fenofibrate and statin 8. DVT prophylaxis 1. Heparin subQ  Code Status: Full Family Communication: Pt in room, family at bedside Disposition Plan: possible discharge home tomorrow    Consultants:  Urology  IR  Procedures:  R sided perc nephrostomy tube  Antibiotics: Anti-infectives    Start     Dose/Rate Route Frequency Ordered Stop   04/14/15 1800  piperacillin-tazobactam (ZOSYN) IVPB 3.375 g     3.375 g 12.5 mL/hr over 240 Minutes Intravenous Every 8 hours 04/14/15 1028     04/14/15 1600  cefTRIAXone (ROCEPHIN) 1 g in dextrose 5 % 50 mL IVPB  Status:  Discontinued     1 g 100 mL/hr over 30 Minutes Intravenous Every 24 hours 04/13/15 1917 04/14/15 0932   04/14/15 0945  piperacillin-tazobactam (ZOSYN) IVPB 3.375 g     3.375 g 100 mL/hr over 30 Minutes Intravenous  Once 04/14/15 0939 04/14/15 1108   04/13/15 1615  cefTRIAXone (ROCEPHIN) 1 g in dextrose 5 % 50 mL IVPB     1 g 100 mL/hr over 30 Minutes Intravenous  Once 04/13/15 1618 04/13/15 1846      HPI/Subjective: No complaints today, no chest pain or shortness of breath. Denies any flank pain at present  Objective: Filed Vitals:   04/15/15 1418 04/15/15 2142 04/16/15 0512 04/16/15 1400  BP: 139/52 152/50 153/68 135/51  Pulse: 94 92 90 72  Temp: 100.9 F (38.3 C) 98.5 F (36.9 C) 98.6 F (37 C) 98.1 F (36.7 C)  TempSrc: Oral Oral Oral Oral  Resp: 18 18 18 18   Height:  Weight:      SpO2: 94% 95% 92% 98%    Intake/Output Summary (Last 24 hours) at 04/16/15 1556 Last data filed at 04/16/15 1518  Gross per 24 hour  Intake    960 ml  Output   2850 ml  Net  -1890 ml   Filed Weights   04/13/15 1300 04/13/15 2012  Weight: 102.967 kg (227 lb) 97.9 kg (215 lb 13.3 oz)    Exam:   General:  Awake, in nad, right nephrostomy is draining yellow urine.  Cardiovascular: RRR  Respiratory: CTA  B  Abdomen: soft,nondistended, R perc nephrostomy tube in place  Musculoskeletal: no edema b/l  Data Reviewed: Basic Metabolic Panel:  Recent Labs Lab 04/13/15 1521 04/15/15 0616  NA 134* 136  K 3.8 3.9  CL 100* 104  CO2 26 25  GLUCOSE 203* 147*  BUN 24* 26*  CREATININE 1.11* 0.86  CALCIUM 10.5* 9.9   Liver Function Tests:  Recent Labs Lab 04/13/15 1521  AST 32  ALT 24  ALKPHOS 52  BILITOT 1.1  PROT 7.9  ALBUMIN 3.5    Recent Labs Lab 04/13/15 1521  LIPASE 23   No results for input(s): AMMONIA in the last 168 hours. CBC:  Recent Labs Lab 04/13/15 1521 04/15/15 0616 04/16/15 0620  WBC 15.9* 14.8* 12.1*  NEUTROABS 14.0*  --   --   HGB 13.1 10.9* 11.2*  HCT 38.6 32.4* 32.6*  MCV 92.8 91.8 91.6  PLT 203 185 213   Cardiac Enzymes:  Recent Labs Lab 04/13/15 1521 04/13/15 2007 04/15/15 0616  TROPONINI 0.13* 0.20* 0.11*   BNP (last 3 results) No results for input(s): BNP in the last 8760 hours.  ProBNP (last 3 results) No results for input(s): PROBNP in the last 8760 hours.  CBG:  Recent Labs Lab 04/15/15 0017  GLUCAP 130*    Recent Results (from the past 240 hour(s))  Urine culture     Status: None   Collection Time: 04/13/15  2:56 PM  Result Value Ref Range Status   Specimen Description URINE, CLEAN CATCH  Final   Special Requests NONE  Final   Culture   Final    MULTIPLE SPECIES PRESENT, SUGGEST RECOLLECTION Performed at [facility]    Report Status 04/15/2015 FINAL  Final  Culture, blood (routine x 2)     Status: None   Collection Time: 04/13/15  3:25 PM  Result Value Ref Range Status   Specimen Description BLOOD RIGHT ANTECUBITAL  Final   Special Requests BOTTLES DRAWN AEROBIC ONLY 6CC  Final   Culture  Setup Time   Final    GRAM NEGATIVE RODS Gram Stain Report Called to,Read Back By and Verified With: BROWER, B AT 1610 ON 04/14/2015 BY WOODS, M AEROBIC BOTTLE ONLY    Culture   Final    KLEBSIELLA  SPECIES SUSCEPTIBILITIES PERFORMED ON PREVIOUS CULTURE WITHIN THE LAST 5 DAYS. Performed at [facility]    Report Status 04/16/2015 FINAL  Final  Culture, blood (routine x 2)     Status: None   Collection Time: 04/13/15  4:45 PM  Result Value Ref Range Status   Specimen Description BLOOD LEFT HAND  Final   Special Requests   Final    BOTTLES DRAWN AEROBIC AND ANAEROBIC AEB=8CC ANA=5CC   Culture  Setup Time   Final    GRAM NEGATIVE RODS Gram Stain Report Called to,Read Back By and Verified With: PATTON, M AT 0715 ON 04/14/2015 BY WOODS, Leodis [place]  NEGATIVE RODS Gram Stain Report Called to,Read Back By and Verified With: SEEN IN ANAROBIC BOTTLE CALLED TO [place], B AT 0925 ON 04/14/15 BY WOODS,M    Culture   Final    KLEBSIELLA SPECIES Performed at [facility]    Report Status 04/16/2015 FINAL  Final   Organism ID, Bacteria KLEBSIELLA SPECIES  Final      Susceptibility   Klebsiella species - MIC*    AMPICILLIN 16 RESISTANT Resistant     CEFAZOLIN <=4 SENSITIVE Sensitive     CEFEPIME <=1 SENSITIVE Sensitive     CEFTAZIDIME <=1 SENSITIVE Sensitive     CEFTRIAXONE <=1 SENSITIVE Sensitive     CIPROFLOXACIN <=0.25 SENSITIVE Sensitive     GENTAMICIN <=1 SENSITIVE Sensitive     IMIPENEM <=0.25 SENSITIVE Sensitive     TRIMETH/SULFA <=20 SENSITIVE Sensitive     AMPICILLIN/SULBACTAM 4 SENSITIVE Sensitive     PIP/TAZO <=4 SENSITIVE Sensitive     * KLEBSIELLA SPECIES     Studies: Ir Nephrostomy Placement Right  04/15/2015  CLINICAL DATA:  Right ureteral obstruction EXAM: IR NEPHROSTOMY PLACEMENT RIGHT FLUOROSCOPY TIME:  48 seconds minutes MEDICATIONS AND MEDICAL HISTORY: Versed 2 mg, Fentanyl 100 mcg. Additional Medications: Ancef. ANESTHESIA/SEDATION: Moderate sedation time: 10 minutes CONTRAST:  5 cc Omnipaque 300 PROCEDURE: The procedure, risks, benefits, and alternatives were explained to the patient. Questions regarding the procedure were encouraged and answered. The  patient understands and consents to the procedure. The back was prepped with Betadine in a sterile fashion, and a sterile drape was applied covering the operative field. A sterile gown and sterile gloves were used for the procedure. Under sonographic guidance, a 21 gauge needle was inserted into a posterior lower pole calyx. Contrast was injected opacifying the collecting system. It was removed over a 018 wire which was up sized to a 3 J. A 10 French dilator followed by a 10 French nephrostomy were inserted. It was coiled and looped in the renal pelvis. It was string fixed then sewn to the skin. Contrast was injected. FINDINGS: Images document a 32 French right nephrostomy places via posterior lower pole of the right kidney. COMPLICATIONS: None IMPRESSION: Successful right percutaneous nephrostomy catheter placement. Electronically Signed   By: Marybelle Killings M.D.   On: 04/15/2015 14:48    Scheduled Meds: . amLODipine  10 mg Oral Daily   And  . benazepril  40 mg Oral Daily  . aspirin  81 mg Oral Daily  . fenofibrate  160 mg Oral Daily  . heparin  5,000 Units Subcutaneous 3 times per day  . hydrochlorothiazide  25 mg Oral q morning - 10a  . nicotine  14 mg Transdermal Daily  . pantoprazole  80 mg Oral Daily  . piperacillin-tazobactam (ZOSYN)  IV  3.375 g Intravenous Q8H  . rosuvastatin  20 mg Oral QHS  . sodium chloride  500 mL Intravenous Once  . sodium chloride  3 mL Intravenous Q12H  . tamsulosin  0.4 mg Oral Daily  . traMADol  50 mg Oral TID   Continuous Infusions: . sodium chloride 100 mL/hr at 04/14/15 1423  . sodium chloride 100 mL/hr at 04/16/15 5885    Principal Problem:   Acute encephalopathy Active Problems:   Essential hypertension, benign   GERD (gastroesophageal reflux disease)   UTI (lower urinary tract infection)   Ureteral stone with hydronephrosis   Chronic pain   Alexis Reber  [facility] Pager [phone]. If 7PM-7AM, please contact night-coverage at  [email or web address], password [facility] 04/16/2015,  3:56 PM  LOS: 3 days

## 2015-04-17 ENCOUNTER — Encounter (HOSPITAL_COMMUNITY): Payer: Self-pay | Admitting: Internal Medicine

## 2015-04-17 DIAGNOSIS — A419 Sepsis, unspecified organism: Secondary | ICD-10-CM | POA: Diagnosis present

## 2015-04-17 DIAGNOSIS — N132 Hydronephrosis with renal and ureteral calculous obstruction: Secondary | ICD-10-CM

## 2015-04-17 DIAGNOSIS — G9340 Encephalopathy, unspecified: Secondary | ICD-10-CM

## 2015-04-17 DIAGNOSIS — A4150 Gram-negative sepsis, unspecified: Secondary | ICD-10-CM

## 2015-04-17 DIAGNOSIS — N10 Acute tubulo-interstitial nephritis: Secondary | ICD-10-CM

## 2015-04-17 DIAGNOSIS — I10 Essential (primary) hypertension: Secondary | ICD-10-CM

## 2015-04-17 DIAGNOSIS — N390 Urinary tract infection, site not specified: Secondary | ICD-10-CM

## 2015-04-17 LAB — BASIC METABOLIC PANEL
ANION GAP: 8 (ref 5–15)
BUN: 16 mg/dL (ref 6–20)
CHLORIDE: 97 mmol/L — AB (ref 101–111)
CO2: 31 mmol/L (ref 22–32)
Calcium: 10.2 mg/dL (ref 8.9–10.3)
Creatinine, Ser: 0.69 mg/dL (ref 0.44–1.00)
GFR calc Af Amer: >60 mL/min (ref >60)
Glucose, Bld: 131 mg/dL — ABNORMAL HIGH (ref 65–99)
POTASSIUM: 2.9 mmol/L — AB (ref 3.5–5.1)
SODIUM: 136 mmol/L (ref 135–145)

## 2015-04-17 LAB — CBC
HCT: 33 % — ABNORMAL LOW (ref 36.0–46.0)
HEMOGLOBIN: 11.3 g/dL — AB (ref 12.0–15.0)
MCH: 31.3 pg (ref 26.0–34.0)
MCHC: 34.2 g/dL (ref 30.0–36.0)
MCV: 91.4 fL (ref 78.0–100.0)
PLATELETS: 237 10*3/uL (ref 150–400)
RBC: 3.61 MIL/uL — AB (ref 3.87–5.11)
RDW: 12.9 % (ref 11.5–15.5)
WBC: 9.8 10*3/uL (ref 4.0–10.5)

## 2015-04-17 MED ORDER — TAMSULOSIN HCL 0.4 MG PO CAPS: 0.4000 mg | ORAL_CAPSULE | Freq: Every day | ORAL | Status: DC

## 2015-04-17 MED ORDER — POTASSIUM CHLORIDE CRYS ER 15 MEQ PO TBCR: 30.0000 meq | EXTENDED_RELEASE_TABLET | Freq: Every day | ORAL | Status: DC

## 2015-04-17 MED ORDER — POTASSIUM CHLORIDE CRYS ER 20 MEQ PO TBCR
40.0000 meq | EXTENDED_RELEASE_TABLET | Freq: Two times a day (BID) | ORAL | Status: DC
  Administered 2015-04-17: 40 meq via ORAL
  Filled 2015-04-17: qty 2

## 2015-04-17 MED ORDER — CIPROFLOXACIN HCL 500 MG PO TABS: 500.0000 mg | ORAL_TABLET | Freq: Two times a day (BID) | ORAL | Status: DC

## 2015-04-17 NOTE — Care Management Important Message (Signed)
Important Message  Patient Details  Name: LEILANNI HALVORSON MRN: [id number] Date of Birth: 1935-10-05   Medicare Important Message Given:  Yes-third notification given    Joylene Draft, RN 04/17/2015, 10:36 AM

## 2015-04-17 NOTE — Evaluation (Signed)
Physical Therapy Evaluation Patient Details Name: Caitlin Mccarthy MRN: [id number] DOB: Jun 26, 1936 Today's Date: 04/17/2015   History of Present Illness  79yo with prior hx of renal stones presented with increased weakness, R sided abd pain, and falls, found to have an obstructing R sided renal calculi and findings worrisome for UTI with sepsis. The patient was admitted for further work up.  Clinical Impression  Pt is received sitting EOB upon entry, awake, alert, and willing to participate. No acute distress noted. Pt is A&Ox3 and pleasant. Pt reports two falls in the last 6 after which, pt reports mobility to have been increasingly more difficult. Pt strength as screened by functional mobility assessment presents with min-moderate weakness, requiring near maximal effort to perform transfers and ambulation. Pt falls risk is high as evidenced by slow gait speed, however rhomberg testing and forward reach are WNL. Pt will benefit from more in depth balance testing. Patient presenting with impairment of strength, gait training, and activity tolerance, limiting ability to perform ADL and mobility tasks at  baseline level of function. Patient will benefit from skilled intervention to address the above impairments and limitations, in order to restore to prior level of function, improve patient safety upon discharge, and to decrease falls risk.       Follow Up Recommendations Home health PT    Equipment Recommendations  Other (comment)    Recommendations for Other Services       Precautions / Restrictions Precautions Precaution Comments: R nephrostomy tube. History of falls  Restrictions Weight Bearing Restrictions: No      Mobility  Bed Mobility               General bed mobility comments: Received in sitting.   Transfers Overall transfer level: Needs assistance Equipment used: Rolling walker (2 wheeled) Transfers: Sit to/from Stand Sit to Stand: Min guard             Ambulation/Gait Ambulation/Gait assistance: Min guard Ambulation Distance (Feet): 100 Feet Assistive device: Rolling walker (2 wheeled) Gait Pattern/deviations: Wide base of support Gait velocity: 0.96m/s- high risk for falls Gait velocity interpretation: <1.8 ft/sec, indicative of risk for recurrent falls    Stairs            Wheelchair Mobility    Modified Rankin (Stroke Patients Only)       Balance Overall balance assessment: History of Falls (Rhomberg testing is unremarkable; Forward reach >10 inches. )                                           Pertinent Vitals/Pain Pain Assessment: No/denies pain Pain Score: 0-No pain    Home Living Family/patient expects to be discharged to:: Private residence Living Arrangements: Children;Spouse/significant other Available Help at Discharge: Family Type of Home: House Home Access: Level entry     Home Layout: One level Home Equipment: Environmental consultant - 2 wheels;Cane - single point;Bedside commode      Prior Function                 Hand Dominance        Extremity/Trunk Assessment   Upper Extremity Assessment: Defer to OT evaluation           Lower Extremity Assessment: Generalized weakness         Communication      Cognition Arousal/Alertness: Awake/alert Behavior During Therapy: [facility] for tasks assessed/performed  Overall Cognitive Status: Within Functional Limits for tasks assessed                      General Comments      Exercises        Assessment/Plan    PT Assessment Patient needs continued PT services  PT Diagnosis Difficulty walking;Abnormality of gait   PT Problem List Decreased strength;Decreased activity tolerance  PT Treatment Interventions DME instruction;Gait training;Functional mobility training;Therapeutic activities;Therapeutic exercise;Balance training   PT Goals (Current goals can be found in the Care Plan section) Acute Rehab PT Goals Patient  Stated Goal: return to home, remain indep in ADL, IADL Time For Goal Achievement: 05/01/15 Potential to Achieve Goals: Good    Frequency Min 3X/week   Barriers to discharge        Co-evaluation               End of Session Equipment Utilized During Treatment: Gait belt Activity Tolerance: Patient tolerated treatment well Patient left: in bed Nurse Communication: Mobility status;Other (comment)         Time: 7741-4239 PT Time Calculation (min) (ACUTE ONLY): 29 min   Charges:   PT Evaluation $Initial PT Evaluation Tier I: 1 Procedure     PT G Codes:        Buccola,Allan C May 07, 2015, 1:08 PM  1:11 PM  Etta Grandchild, PT, DPT [place] License # [id number]

## 2015-04-17 NOTE — Plan of Care (Signed)
Problem: Acute Rehab PT Goals(only PT should resolve) Goal: Pt Will Transfer Bed To Chair/Chair To Bed Pt will transfer 5x sit to/from-stand without AD at supervision in < 16 seconds without loss-of-balance to demonstrate good safety awareness for independent mobility in home.      Goal: Pt Will Ambulate Pt will ambulate with SPC at Supervision using a step-through pattern and equal step length for a distances greater than 261ft to demonstrate the ability to perform safe household distance ambulation at discharge.

## 2015-04-17 NOTE — Discharge Summary (Signed)
Physician Discharge Summary  Caitlin Mccarthy:[id number] DOB: 16-Nov-1935 DOA: 04/13/2015  PCP: [name], FNP  Admit date: 04/13/2015 Discharge date: 04/17/2015  Time spent: Greater than 30 minutes  Recommendations for Outpatient Follow-up:  1. Patient was discharged with the right nephrostomy tube in place. Home health nursing was ordered at the time of discharge.  2. Consider outpatient referral to cardiology for possible CAD. 3. Recommend follow-up check of her serum potassium. 4. Recommend follow-up of her venous or capillary blood glucose as her glucose was mildly elevated during hospitalization.  Discharge Diagnoses:  1. Obstructing 4 mm distal right ureteral calculus causing moderate right hydroureteronephrosis. 2. Acute pyelonephritis, secondary to Klebsiella. 3. Sepsis secondary to acute pyelonephritis and Klebsiella bacteremia. 4. Acute encephalopathy secondary to infections. Resolved. 5. Chronic pain syndrome. 6. Hypokalemia. 7. Obesity. 8. Hypertension. 9. Mildly elevated troponin I, thought to be secondary to sepsis. 10. Mild hyperglycemia.   Discharge Condition: Improved.  Diet recommendation: heart healthy.  Filed Weights   04/13/15 1300 04/13/15 2012  Weight: 102.967 kg (227 lb) 97.9 kg (215 lb 13.3 oz)    History of present illness:  The patient is a 79 year old woman with a history of hypertension, kidney stones, DJD, and mild chronic kidney disease, who presented to the emergency department on 04/13/2015 with a chief complaint of generalized weakness, confusion, fall, and abdominal pain. In the ED, she was afebrile, mildly tachypneic, mildly tachycardic, and with a blood pressure within normal limits. Lactic acid was elevated at 2.1. Her troponin I was elevated at 0.13. Her WBC was elevated at 15.9. Her urinalysis was positive for nitrite, moderate leukocytes, too numerous to count WBCs, and bacteria. CT of abdomen and pelvis revealed  obstructing 4 mm distal right ureteral calculus causing moderate right hydroureteronephrosis and nonobstructing left renal calculi, hepatic stenosis, and abdominal aortic atherosclerosis. She was admitted for further evaluation and management.    Hospital Course:  1. Sepsis secondary to Klebsiella acute pyelonephritis, complicated by right sided obstructive renal calculi causing hydroureteronephrosis. Urine culture was ordered in the ED. The patient was given Rocephin in the ED, but antibiotic therapy was subsequently expanded and changed to Zosyn. Vigorous IV fluid hydration was started. Nephrology was consulted regarding the obstructive right renal calculus. Urologist, Dr. Alyson Ingles recommended consulting IR for placement of a percutaneous right nephrostomy tube. The patient was transferred to [facility] where IR, Dr. Barbie Banner successfully placed the nephrostomy tube. Patient's urine culture eventually grew out greater than 100,000 colonies of Klebsiella, virtually pansensitive. Zosyn was discontinued in favor of Cipro. The patient received 4-1/2 days of IV antibiotics. She was discharged on 9 more days of Cipro. -She will follow-up with urologist, Dr. Alyson Ingles for further management of the obstructive right renal stone.  Elevated troponin I. The patient's troponin I was modestly elevated and range from 0.13-0.2. There was no significant ST or T-wave abnormalities on the EKG on admission. The patient denied any chest pain. However, it is likely that she may have underlying CAD, therefore, referral to cardiologist as an outpatient is recommended. This will be deferred to her PCP. She was continued on aspirin, statin and antihypertensive medication. -The likely etiology of her elevated troponin I was sepsis rather than ACS.  Essential hypertension. The patient's blood pressure was stable during the hospitalization. She was continued on amlodipine, benazepril, and HCTZ.  Hypokalemia. Patient's serum potassium  was 3.8 on admission. However following vigorous IV fluids, her serum potassium decreased. At the time of discharge, it was 2.9. She was given  40 mEq prior to discharge and was discharged on 5 more days of daily potassium supplementation. Recommend follow-up recheck of her serum potassium.  Hyperglycemia. Patient is venous glucose was 131 at the time of discharge. A1c was not ordered during the hospitalization, but it is recommended in the outpatient setting. Recommend follow-up evaluation of diabetes in the outpatient setting.  Acute encephalopathy. The family reported confusion at home. The acute encephalopathy was likely secondary to sepsis/infection. At the time of discharge, she was no longer encephalopathic.    Procedures:  Right sided percutaneous nephrostomy tube, per IR.  Consultations:  Urology  IR  Discharge Exam: Filed Vitals:   04/17/15 1520  BP: 144/61  Pulse: 73  Temp: 98.1 F (36.7 C)  Resp: 18    General: pleasant obese 79 year old woman in no acute distress. Cardiovascular: S1, S2 no murmurs rubs or gallops Respiratory: decreased breath sounds in the bases, but clear otherwise. Abdomen: Obese, positive bowel sounds, soft, nontender, nondistended. Right flank with nephrostomy tube in place, draining clear yellow urine. Neurologic: She is alert and oriented 2. Her speech is clear. She ambulated well with the assistance of the physical therapist.  Discharge Instructions   Discharge Instructions    Diet - low sodium heart healthy    Complete by:  As directed      Increase activity slowly    Complete by:  As directed           Current Discharge Medication List    START taking these medications   Details  ciprofloxacin (CIPRO) 500 MG tablet Take 1 tablet (500 mg total) by mouth 2 (two) times daily. Antibiotic to be taken for 9 more days. Qty: 18 tablet, Refills: 0    potassium chloride SA (KLOR-CON M15) 15 MEQ tablet Take 2 tablets (30 mEq total) by  mouth daily. Take for 5 more days. Qty: 10 tablet, Refills: 0    tamsulosin (FLOMAX) 0.4 MG CAPS capsule Take 1 capsule (0.4 mg total) by mouth daily. New medication for your kidneys. Qty: 30 capsule, Refills: 1      CONTINUE these medications which have NOT CHANGED   Details  amLODipine-benazepril (LOTREL) 10-40 MG per capsule TAKE ONE CAPSULE EACH MORNING Qty: 30 capsule, Refills: 5   Associated Diagnoses: Essential hypertension, benign    aspirin 81 MG tablet Take 81 mg by mouth daily.    cetirizine (ZYRTEC) 10 MG tablet Take 1 tablet (10 mg total) by mouth daily. Qty: 30 tablet, Refills: 11   Associated Diagnoses: Acute bronchitis, unspecified organism    cyclobenzaprine (FLEXERIL) 5 MG tablet TAKE ONE TABLET BY MOUTH THREE TIMES DAILY AS NEEDED FOR MUSCLE SPASM Qty: 30 tablet, Refills: 1    fenofibrate 160 MG tablet Take 160 mg by mouth daily.     hydrochlorothiazide (HYDRODIURIL) 25 MG tablet Take 1 tablet (25 mg total) by mouth every morning. Qty: 30 tablet, Refills: 5   Associated Diagnoses: Arthritis    omeprazole (PRILOSEC) 40 MG capsule Take 1 capsule (40 mg total) by mouth daily. Qty: 30 capsule, Refills: 5   Associated Diagnoses: Gastroesophageal reflux disease without esophagitis    rosuvastatin (CRESTOR) 20 MG tablet Take 1 tablet (20 mg total) by mouth at bedtime. Qty: 30 tablet, Refills: 5   Associated Diagnoses: Hyperlipidemia with target LDL less than 100    traMADol (ULTRAM) 50 MG tablet Take 1 tablet (50 mg total) by mouth 3 (three) times daily. Qty: 90 tablet, Refills: 0      STOP taking  these medications     albuterol (PROVENTIL HFA;VENTOLIN HFA) 108 (90 BASE) MCG/ACT inhaler      fenofibrate micronized (ANTARA) 130 MG capsule        No Known Allergies Follow-up Information    Follow up with Cleon Gustin, MD On 05/10/2015.   Specialty:  Urology   Why:  at 11:00 am in the [facility] office   Contact information:   [address]  [place] [address] [phone]       Follow up with [place].   Contact information:   [address] [place] [address] [phone]       Schedule an appointment as soon as possible for a visit with Redge Gainer, MD.   Specialty:  Family Medicine   Why:  To be seen in 1-2 weeks for hospital follow up   Contact information:   [place] [address] [phone]        The results of significant diagnostics from this hospitalization (including imaging, microbiology, ancillary and laboratory) are listed below for reference.    Significant Diagnostic Studies: Dg Chest 2 View  04/13/2015  CLINICAL DATA:  Weakness, multiple falls EXAM: CHEST  2 VIEW COMPARISON:  10/22/2011 FINDINGS: Cardiomediastinal silhouette is stable. No acute infiltrate or pleural effusion. No pulmonary edema. Thoracic spine osteopenia. IMPRESSION: No active cardiopulmonary disease. Electronically Signed   By: Lahoma Crocker M.D.   On: 04/13/2015 16:53   Ct Head Wo Contrast  04/13/2015  CLINICAL DATA:  Altered mental status.  Hypertension. EXAM: CT HEAD WITHOUT CONTRAST TECHNIQUE: Contiguous axial images were obtained from the base of the skull through the vertex without intravenous contrast. COMPARISON:  None. FINDINGS: Study is limited due to extension motion artifact despite several attempts to image the entire intracranial contents. There is age related volume loss. No appreciable mass, hemorrhage, extra-axial fluid collection, or midline shift is appreciable on this limited study. There is mild small vessel disease in the centra semiovale bilaterally. No acute infarct is demonstrable on this study. Bony calvarium appears grossly intact. The mastoid air cells appear clear. IMPRESSION: Limited study due to patient motion. Age related volume loss with mild periventricular small vessel disease. No acute infarct is appreciable on this less than optimal study. No  hemorrhage or mass effect apparent. Electronically Signed   By: Lowella Grip III M.D.   On: 04/13/2015 16:34   Ct Abdomen Pelvis W Contrast  04/13/2015  CLINICAL DATA:  79 year old female with acute abdominal and pelvic pain following fall 5 days ago. EXAM: CT ABDOMEN AND PELVIS WITH CONTRAST TECHNIQUE: Multidetector CT imaging of the abdomen and pelvis was performed using the standard protocol following bolus administration of intravenous contrast. CONTRAST:  160mL OMNIPAQUE IOHEXOL 300 MG/ML  SOLN COMPARISON:  10/28/2011 pelvis radiographs FINDINGS: Lower chest:  Unremarkable Hepatobiliary: Hepatic steatosis identified without focal hepatic abnormality. The gallbladder is unremarkable. There is no evidence of biliary dilatation. Pancreas: Unremarkable Spleen: Unremarkable Adrenals/Urinary Tract: A 4 mm distal right ureteral calculus (1 cm above the right UVJ causes moderate right hydroureteronephrosis. Multiple nonobstructing left upper pole renal calculi are identified, the largest measuring 7 mm. Bilateral adrenal hyperplasia is identified. The bladder is unremarkable. Stomach/Bowel: There is no evidence of bowel obstruction or focal bowel wall thickening. The appendix is unremarkable. Vascular/Lymphatic: No enlarged lymph nodes identified. Aortic atherosclerotic calcification noted without aneurysm. Reproductive: Uterus and adnexal regions are within normal limits. Other: No free fluid, pneumoperitoneum or abscess. Musculoskeletal:  Right total hip arthroplasty is identified obscuring some detail within the pelvis. No acute or suspicious abnormalities are identified. Moderate degenerative changes within the lumbar spine identified. IMPRESSION: Obstructing 4 mm distal right ureteral calculus (1 cm above the right UVJ) causing moderate right hydroureteronephrosis. Nonobstructing left renal calculi, hepatic steatosis and abdominal aortic atherosclerosis. Electronically Signed   By: Margarette Canada M.D.   On:  04/13/2015 16:38   Dg Hip Unilat With Pelvis 2-3 Views Right  04/13/2015  CLINICAL DATA:  Pain following fall 5 days prior EXAM: DG HIP (WITH OR WITHOUT PELVIS) 2-3V RIGHT COMPARISON:  October 28, 2011 FINDINGS: Frontal pelvis as well as frontal and lateral right hip images were obtained. There is a total hip prosthesis on the right with prosthetic components appearing well-seated. No acute fracture or dislocation. Left hip joint appears unremarkable. There is lower lumbar levoscoliosis. IMPRESSION: No fracture or dislocation. Total hip prosthetic components on the right appear well seated. Electronically Signed   By: Lowella Grip III M.D.   On: 04/13/2015 14:10   Ir Nephrostomy Placement Right  04/15/2015  CLINICAL DATA:  Right ureteral obstruction EXAM: IR NEPHROSTOMY PLACEMENT RIGHT FLUOROSCOPY TIME:  48 seconds minutes MEDICATIONS AND MEDICAL HISTORY: Versed 2 mg, Fentanyl 100 mcg. Additional Medications: Ancef. ANESTHESIA/SEDATION: Moderate sedation time: 10 minutes CONTRAST:  5 cc Omnipaque 300 PROCEDURE: The procedure, risks, benefits, and alternatives were explained to the patient. Questions regarding the procedure were encouraged and answered. The patient understands and consents to the procedure. The back was prepped with Betadine in a sterile fashion, and a sterile drape was applied covering the operative field. A sterile gown and sterile gloves were used for the procedure. Under sonographic guidance, a 21 gauge needle was inserted into a posterior lower pole calyx. Contrast was injected opacifying the collecting system. It was removed over a 018 wire which was up sized to a 3 J. A 10 French dilator followed by a 10 French nephrostomy were inserted. It was coiled and looped in the renal pelvis. It was string fixed then sewn to the skin. Contrast was injected. FINDINGS: Images document a 71 French right nephrostomy places via posterior lower pole of the right kidney. COMPLICATIONS: None  IMPRESSION: Successful right percutaneous nephrostomy catheter placement. Electronically Signed   By: Marybelle Killings M.D.   On: 04/15/2015 14:48    Microbiology: Recent Results (from the past 240 hour(s))  Urine culture     Status: None   Collection Time: 04/13/15  2:56 PM  Result Value Ref Range Status   Specimen Description URINE, CLEAN CATCH  Final   Special Requests NONE  Final   Culture   Final    MULTIPLE SPECIES PRESENT, SUGGEST RECOLLECTION Performed at [facility]    Report Status 04/15/2015 FINAL  Final  Culture, blood (routine x 2)     Status: None   Collection Time: 04/13/15  3:25 PM  Result Value Ref Range Status   Specimen Description BLOOD RIGHT ANTECUBITAL  Final   Special Requests BOTTLES DRAWN AEROBIC ONLY 6CC  Final   Culture  Setup Time   Final    GRAM NEGATIVE RODS Gram Stain Report Called to,Read Back By and Verified With: BROWER, B AT 7494 ON 04/14/2015 BY WOODS, M AEROBIC BOTTLE ONLY    Culture   Final    KLEBSIELLA SPECIES SUSCEPTIBILITIES PERFORMED ON PREVIOUS CULTURE WITHIN THE LAST 5 DAYS. Performed at [facility]    Report Status 04/16/2015 FINAL  Final  Culture, blood (  routine x 2)     Status: None   Collection Time: 04/13/15  4:45 PM  Result Value Ref Range Status   Specimen Description BLOOD LEFT HAND  Final   Special Requests   Final    BOTTLES DRAWN AEROBIC AND ANAEROBIC AEB=8CC ANA=5CC   Culture  Setup Time   Final    GRAM NEGATIVE RODS Gram Stain Report Called to,Read Back By and Verified With: PATTON, M AT 0715 ON 04/14/2015 BY WOODS, M GRAM NEGATIVE RODS Gram Stain Report Called to,Read Back By and Verified With: SEEN IN ANAROBIC BOTTLE CALLED TO [place], B AT [place] ON 04/14/15 BY WOODS,M    Culture   Final    KLEBSIELLA SPECIES Performed at [facility]    Report Status 04/16/2015 FINAL  Final   Organism ID, Bacteria KLEBSIELLA SPECIES  Final      Susceptibility   Klebsiella species - MIC*    AMPICILLIN 16  RESISTANT Resistant     CEFAZOLIN <=4 SENSITIVE Sensitive     CEFEPIME <=1 SENSITIVE Sensitive     CEFTAZIDIME <=1 SENSITIVE Sensitive     CEFTRIAXONE <=1 SENSITIVE Sensitive     CIPROFLOXACIN <=0.25 SENSITIVE Sensitive     GENTAMICIN <=1 SENSITIVE Sensitive     IMIPENEM <=0.25 SENSITIVE Sensitive     TRIMETH/SULFA <=20 SENSITIVE Sensitive     AMPICILLIN/SULBACTAM 4 SENSITIVE Sensitive     PIP/TAZO <=4 SENSITIVE Sensitive     * KLEBSIELLA SPECIES     Labs: Basic Metabolic Panel:  Recent Labs Lab 04/13/15 1521 04/15/15 0616 04/17/15 0646  NA 134* 136 136  K 3.8 3.9 2.9*  CL 100* 104 97*  CO2 26 25 31   GLUCOSE 203* 147* 131*  BUN 24* 26* 16  CREATININE 1.11* 0.86 0.69  CALCIUM 10.5* 9.9 10.2   Liver Function Tests:  Recent Labs Lab 04/13/15 1521  AST 32  ALT 24  ALKPHOS 52  BILITOT 1.1  PROT 7.9  ALBUMIN 3.5    Recent Labs Lab 04/13/15 1521  LIPASE 23   No results for input(s): AMMONIA in the last 168 hours. CBC:  Recent Labs Lab 04/13/15 1521 04/15/15 0616 04/16/15 0620 04/17/15 0646  WBC 15.9* 14.8* 12.1* 9.8  NEUTROABS 14.0*  --   --   --   HGB 13.1 10.9* 11.2* 11.3*  HCT 38.6 32.4* 32.6* 33.0*  MCV 92.8 91.8 91.6 91.4  PLT 203 185 213 237   Cardiac Enzymes:  Recent Labs Lab 04/13/15 1521 04/13/15 2007 04/15/15 0616  TROPONINI 0.13* 0.20* 0.11*   BNP: BNP (last 3 results) No results for input(s): BNP in the last 8760 hours.  ProBNP (last 3 results) No results for input(s): PROBNP in the last 8760 hours.  CBG:  Recent Labs Lab 04/15/15 0017  GLUCAP 130*       Signed:  Danamarie Minami  [facility] 04/17/2015, 3:21 PM

## 2015-04-17 NOTE — Final Progress Note (Signed)
Patient discharged with instructions, prescription, and care notes.  Verbalized understanding via teach back.  IV was removed and the site was WNL. Patient voiced no further complaints or concerns at the time of discharge.  Appointments scheduled per instructions.  Patient left the floor via w/c with staff and family in stable condition.  Sent the patient home with a clean container to empty the tube in.

## 2015-04-17 NOTE — Care Management Note (Signed)
Case Management Note  Patient Details  Name: Caitlin Mccarthy MRN: [id number] Date of Birth: [birth year]  Subjective/Objective:                  Pt admitted from home with sepsis. Pt lives with her husband and will return home at discharge. Pt uses a cane and walker prn. Pt is fairly independent with ADL's.  Action/Plan: Pt discharging home with nephrostomy tube. RN to educate pt and family on catheter care.  [place] RN and PT arranged with [facility] at discharge (per pts choice). Romualdo Bolk of [facility] is aware and will collect pts information from the chart. [place] services to start within 48 hours of discharge. No DME needs noted. Pt and pts nurse aware of discharge arrangements.  Expected Discharge Date:                  Expected Discharge Plan:  [place]  In-House Referral:  NA  Discharge planning Services  CM Consult  Post Acute Care Choice:  Home Health Choice offered to:  Patient  DME Arranged:    DME Agency:     HH Arranged:  RN, PT [place] Agency:  [facility]  Status of Service:  Completed, signed off  Medicare Important Message Given:  Yes-second notification given Date Medicare IM Given:    Medicare IM give by:    Date Additional Medicare IM Given:    Additional Medicare Important Message give by:     If discussed at [place] of Stay Meetings, dates discussed:    Additional Comments:  Joylene Draft, RN 04/17/2015, 10:32 AM

## 2015-04-18 LAB — URINE CULTURE: Culture: NO GROWTH

## 2015-04-19 ENCOUNTER — Telehealth: Payer: Self-pay | Admitting: Nurse Practitioner

## 2015-04-19 DIAGNOSIS — Z436 Encounter for attention to other artificial openings of urinary tract: Secondary | ICD-10-CM | POA: Diagnosis not present

## 2015-04-19 DIAGNOSIS — I10 Essential (primary) hypertension: Secondary | ICD-10-CM | POA: Diagnosis not present

## 2015-04-19 DIAGNOSIS — N136 Pyonephrosis: Secondary | ICD-10-CM | POA: Diagnosis not present

## 2015-04-19 DIAGNOSIS — B961 Klebsiella pneumoniae [K. pneumoniae] as the cause of diseases classified elsewhere: Secondary | ICD-10-CM | POA: Diagnosis not present

## 2015-04-19 DIAGNOSIS — N200 Calculus of kidney: Secondary | ICD-10-CM

## 2015-04-19 DIAGNOSIS — G894 Chronic pain syndrome: Secondary | ICD-10-CM | POA: Diagnosis not present

## 2015-04-19 DIAGNOSIS — Z7982 Long term (current) use of aspirin: Secondary | ICD-10-CM | POA: Diagnosis not present

## 2015-04-19 NOTE — Telephone Encounter (Signed)
Patient was seen in hospital for a kidney stone and they put in a stint to drain infection. They advised her that she needs to see a urologist and made her an appt for [facility] but its in November. Patient doesn't think she can wait that long. Wants to know if an urgent referral can be made so they can move appt up. Please advise

## 2015-04-20 ENCOUNTER — Telehealth: Payer: Self-pay | Admitting: Nurse Practitioner

## 2015-04-20 DIAGNOSIS — G894 Chronic pain syndrome: Secondary | ICD-10-CM | POA: Diagnosis not present

## 2015-04-20 DIAGNOSIS — Z7982 Long term (current) use of aspirin: Secondary | ICD-10-CM | POA: Diagnosis not present

## 2015-04-20 DIAGNOSIS — N136 Pyonephrosis: Secondary | ICD-10-CM | POA: Diagnosis not present

## 2015-04-20 DIAGNOSIS — I10 Essential (primary) hypertension: Secondary | ICD-10-CM | POA: Diagnosis not present

## 2015-04-20 DIAGNOSIS — B961 Klebsiella pneumoniae [K. pneumoniae] as the cause of diseases classified elsewhere: Secondary | ICD-10-CM | POA: Diagnosis not present

## 2015-04-20 DIAGNOSIS — Z436 Encounter for attention to other artificial openings of urinary tract: Secondary | ICD-10-CM | POA: Diagnosis not present

## 2015-04-20 NOTE — Telephone Encounter (Signed)
Sent in urgent referral request to urology

## 2015-04-20 NOTE — Telephone Encounter (Signed)
Pt scheduled for 04/21/2015 at 9:45 in [place] with [facility]; husband aware of appointment date/time

## 2015-04-21 ENCOUNTER — Ambulatory Visit (INDEPENDENT_AMBULATORY_CARE_PROVIDER_SITE_OTHER): Payer: Commercial Managed Care - HMO | Admitting: Urology

## 2015-04-21 DIAGNOSIS — N201 Calculus of ureter: Secondary | ICD-10-CM | POA: Diagnosis not present

## 2015-04-21 DIAGNOSIS — R109 Unspecified abdominal pain: Secondary | ICD-10-CM | POA: Diagnosis not present

## 2015-04-21 DIAGNOSIS — A419 Sepsis, unspecified organism: Secondary | ICD-10-CM | POA: Diagnosis not present

## 2015-04-21 NOTE — Telephone Encounter (Signed)
[facility] to x-ray; Appointment date/time had been given to husband; Tawanna Early not on DPR

## 2015-04-24 ENCOUNTER — Encounter (HOSPITAL_BASED_OUTPATIENT_CLINIC_OR_DEPARTMENT_OTHER): Payer: Self-pay | Admitting: *Deleted

## 2015-04-24 ENCOUNTER — Telehealth: Payer: Self-pay | Admitting: Nurse Practitioner

## 2015-04-24 ENCOUNTER — Other Ambulatory Visit: Payer: Self-pay | Admitting: Urology

## 2015-04-24 DIAGNOSIS — N136 Pyonephrosis: Secondary | ICD-10-CM | POA: Diagnosis not present

## 2015-04-24 DIAGNOSIS — Z7982 Long term (current) use of aspirin: Secondary | ICD-10-CM | POA: Diagnosis not present

## 2015-04-24 DIAGNOSIS — G894 Chronic pain syndrome: Secondary | ICD-10-CM | POA: Diagnosis not present

## 2015-04-24 DIAGNOSIS — Z436 Encounter for attention to other artificial openings of urinary tract: Secondary | ICD-10-CM | POA: Diagnosis not present

## 2015-04-24 DIAGNOSIS — B961 Klebsiella pneumoniae [K. pneumoniae] as the cause of diseases classified elsewhere: Secondary | ICD-10-CM | POA: Diagnosis not present

## 2015-04-24 DIAGNOSIS — I10 Essential (primary) hypertension: Secondary | ICD-10-CM | POA: Diagnosis not present

## 2015-04-25 ENCOUNTER — Encounter (HOSPITAL_BASED_OUTPATIENT_CLINIC_OR_DEPARTMENT_OTHER): Payer: Self-pay | Admitting: *Deleted

## 2015-04-25 ENCOUNTER — Telehealth: Payer: Self-pay | Admitting: Nurse Practitioner

## 2015-04-25 ENCOUNTER — Telehealth: Payer: Self-pay

## 2015-04-25 DIAGNOSIS — Z436 Encounter for attention to other artificial openings of urinary tract: Secondary | ICD-10-CM | POA: Diagnosis not present

## 2015-04-25 DIAGNOSIS — G894 Chronic pain syndrome: Secondary | ICD-10-CM | POA: Diagnosis not present

## 2015-04-25 DIAGNOSIS — B961 Klebsiella pneumoniae [K. pneumoniae] as the cause of diseases classified elsewhere: Secondary | ICD-10-CM | POA: Diagnosis not present

## 2015-04-25 DIAGNOSIS — N136 Pyonephrosis: Secondary | ICD-10-CM | POA: Diagnosis not present

## 2015-04-25 DIAGNOSIS — I10 Essential (primary) hypertension: Secondary | ICD-10-CM | POA: Diagnosis not present

## 2015-04-25 DIAGNOSIS — M1990 Unspecified osteoarthritis, unspecified site: Secondary | ICD-10-CM

## 2015-04-25 DIAGNOSIS — Z7982 Long term (current) use of aspirin: Secondary | ICD-10-CM | POA: Diagnosis not present

## 2015-04-25 NOTE — Progress Notes (Signed)
SPOKE W/ PT DAUGHTER, MISTY MANNING. LEFT DAUGHTER BE IN PRE-OP SINCE PT STILL HAS SOME CONFUSION FROM RECENT UTI.  NPO AFTER MN. ARRIVE AT 1045.  NEED ISTAT (K+ 2.9 ON 04-17-2015). CURRENT EKG IN CHART AND EPIC.  WILL TAKE PRILOSEC AND FLOMAX AM DOS W/ SIPS OF WATER AND IF NEEDED TAKE TRAMADOL/ FLEXERIL.

## 2015-04-25 NOTE — Telephone Encounter (Signed)
Wants a referral to arthritis specialist

## 2015-04-25 NOTE — Telephone Encounter (Signed)
Do you mean ortho or rheumatologist

## 2015-04-26 DIAGNOSIS — N136 Pyonephrosis: Secondary | ICD-10-CM | POA: Diagnosis not present

## 2015-04-26 DIAGNOSIS — G894 Chronic pain syndrome: Secondary | ICD-10-CM | POA: Diagnosis not present

## 2015-04-26 DIAGNOSIS — Z436 Encounter for attention to other artificial openings of urinary tract: Secondary | ICD-10-CM | POA: Diagnosis not present

## 2015-04-26 DIAGNOSIS — Z7982 Long term (current) use of aspirin: Secondary | ICD-10-CM | POA: Diagnosis not present

## 2015-04-26 DIAGNOSIS — I10 Essential (primary) hypertension: Secondary | ICD-10-CM | POA: Diagnosis not present

## 2015-04-26 DIAGNOSIS — B961 Klebsiella pneumoniae [K. pneumoniae] as the cause of diseases classified elsewhere: Secondary | ICD-10-CM | POA: Diagnosis not present

## 2015-04-26 NOTE — Telephone Encounter (Signed)
When she originally called in the message says arthritis specialist

## 2015-04-26 NOTE — H&P (Signed)
Active Problems  1. Rt flank pain (R10.9)  History of Present Illness  Caitlin Mccarthy is a 79 yo WF who was admitted to [facility] last Thursday with urosepsis and a right distal stone.  She was sent to [facility] for a perc and was treated with antibiotics.  She was discharged on Monday.   She has not seen the stone pass.   She has a history of stones and has had prior lithotripsy and ureteroscopy.   She has has some pain in her coccyx after a fall but no flank pain.  She has had some sweats.  The chills have resolved.   She has had no hematuria.   Past Medical History  1. History of Anxiety (F41.9)  2. History of arthritis (Z87.39)  3. History of depression (Z86.59)  4. History of hypercholesterolemia (Z86.39)  5. History of hypertension (Z86.79)  Surgical History  1. History of Hip Surgery  2. History of Knee Surgery  Current Meds  1. Aspirin 81 MG TABS;  Therapy: (Recorded:21Oct2016) to Recorded  2. Aspirin TBCR;  Therapy: (Recorded:21Oct2016) to Recorded  3. Cipro TABS;  Therapy: (Recorded:21Oct2016) to Recorded  4. Lotrel 10-40 MG Oral Capsule;  Therapy: (Recorded:21Oct2016) to Recorded  5. Lotrel CAPS;  Therapy: (Recorded:21Oct2016) to Recorded  6. Potassium TABS;  Therapy: (Recorded:21Oct2016) to Recorded  Allergies  1. No Known Drug Allergies  Family History  1. Family history of Deceased : Mother, Father  Social History   Caffeine use (F15.90)   2 p/d   Does not use tobacco (Z78.9)   Married   No alcohol use  Review of Systems Genitourinary, constitutional, skin, eye, otolaryngeal, hematologic/lymphatic, cardiovascular, pulmonary, endocrine, musculoskeletal, gastrointestinal, neurological and psychiatric system(s) were reviewed and pertinent findings if present are noted and are otherwise negative.  Genitourinary: nocturia.  Gastrointestinal: nausea, heartburn and constipation.  Constitutional: night sweats and feeling tired (fatigue).  ENT: sinus problems.   Musculoskeletal: joint pain.  Psychiatric: depression.    Vitals Vital Signs [Data Includes: Last 1 Day]  Recorded: 21Oct2016 11:07AM  Height: 5 ft 10 in Weight: 220 lb  BMI Calculated: 31.57 BSA Calculated: 2.17 Blood Pressure: 146 / 70 Temperature: 97.9 F Heart Rate: 83  Physical Exam Constitutional: Well nourished and well developed . No acute distress.  Neck: The appearance of the neck is normal and no neck mass is present.  Pulmonary: No respiratory distress and normal respiratory rhythm and effort.  Cardiovascular: Heart rate and rhythm are normal . No peripheral edema.  Abdomen: Right Perc tube in place.    Results/Data Urine [Data Includes: Last 1 Day]   21Oct2016  COLOR YELLOW   APPEARANCE CLEAR   SPECIFIC GRAVITY 1.020   pH 5.5   GLUCOSE NEGATIVE   BILIRUBIN NEGATIVE   KETONE NEGATIVE   BLOOD TRACE   PROTEIN TRACE   NITRITE NEGATIVE   LEUKOCYTE ESTERASE TRACE   SQUAMOUS EPITHELIAL/HPF 6-10 HPF  WBC 6-10 WBC/HPF  RBC 0-2 RBC/HPF  BACTERIA FEW HPF  CRYSTALS NONE SEEN HPF  CASTS NONE SEEN LPF  Yeast NONE SEEN HPF   Old records or history reviewed: Hospital notes reviewed.  The following images/tracing/specimen were independently visualized:  I have reviewed her CT films and IR films from the hospitalization.  The following clinical lab reports were reviewed:  UA and hospital labs reviewed.    Assessment  1. Calculus of distal right ureter (N20.1)  2. Sepsis (A41.9)   She is doing well post right perc for the right distal stone with  sepsis.   Plan Rt flank pain   1. UA With REFLEX; [Do Not Release]; Status:Hold For - [employer];  Requested for:21Oct2016;  Sepsis   2. Start: Ciprofloxacin HCl - 500 MG Oral Tablet; take 1 tablet by mouth at bedtime every  other day  3. URINE CULTURE; Status:Hold For - Specimen/Data Collection,Appointment; Requested  for:21Oct2016;    I discussed the options and will get her set up for ureteroscopy  with laser and stenting next week with Dr. Alyson Ingles or me.  She prefers [facility].   I have reviewed the risks of bleeding, infection, ureteral injury, need for a stent, thrombotic events and anesthetic complications.  I will keep her on the cipro night until the procedure.

## 2015-04-26 NOTE — Telephone Encounter (Signed)
Stp and she asked me to call her daughter Rojelio Brenner [phone] to ask her which rheumatologist she was going to see. Spoke with [place] and they were going to use Dr.Deveshwar.

## 2015-04-27 ENCOUNTER — Ambulatory Visit (HOSPITAL_BASED_OUTPATIENT_CLINIC_OR_DEPARTMENT_OTHER): Payer: Commercial Managed Care - HMO | Admitting: Anesthesiology

## 2015-04-27 ENCOUNTER — Encounter (HOSPITAL_BASED_OUTPATIENT_CLINIC_OR_DEPARTMENT_OTHER)
Admission: RE | Disposition: A | Discharge status: Home / Self Care | Payer: Self-pay | Source: Ambulatory Visit | Attending: Urology

## 2015-04-27 ENCOUNTER — Encounter (HOSPITAL_BASED_OUTPATIENT_CLINIC_OR_DEPARTMENT_OTHER): Payer: Self-pay | Admitting: *Deleted

## 2015-04-27 ENCOUNTER — Ambulatory Visit (HOSPITAL_BASED_OUTPATIENT_CLINIC_OR_DEPARTMENT_OTHER)
Admission: RE | Admit: 2015-04-27 | Discharge: 2015-04-27 | Disposition: A | Discharge status: Home / Self Care | Payer: Commercial Managed Care - HMO | Source: Ambulatory Visit | Attending: Urology | Admitting: Urology

## 2015-04-27 DIAGNOSIS — M1990 Unspecified osteoarthritis, unspecified site: Secondary | ICD-10-CM | POA: Insufficient documentation

## 2015-04-27 DIAGNOSIS — F17200 Nicotine dependence, unspecified, uncomplicated: Secondary | ICD-10-CM | POA: Diagnosis not present

## 2015-04-27 DIAGNOSIS — Z87442 Personal history of urinary calculi: Secondary | ICD-10-CM | POA: Diagnosis not present

## 2015-04-27 DIAGNOSIS — Z7982 Long term (current) use of aspirin: Secondary | ICD-10-CM | POA: Diagnosis not present

## 2015-04-27 DIAGNOSIS — E7800 Pure hypercholesterolemia, unspecified: Secondary | ICD-10-CM | POA: Insufficient documentation

## 2015-04-27 DIAGNOSIS — N390 Urinary tract infection, site not specified: Secondary | ICD-10-CM | POA: Insufficient documentation

## 2015-04-27 DIAGNOSIS — Z436 Encounter for attention to other artificial openings of urinary tract: Secondary | ICD-10-CM | POA: Diagnosis not present

## 2015-04-27 DIAGNOSIS — I10 Essential (primary) hypertension: Secondary | ICD-10-CM | POA: Diagnosis not present

## 2015-04-27 DIAGNOSIS — N201 Calculus of ureter: Secondary | ICD-10-CM | POA: Insufficient documentation

## 2015-04-27 DIAGNOSIS — A419 Sepsis, unspecified organism: Secondary | ICD-10-CM | POA: Diagnosis not present

## 2015-04-27 DIAGNOSIS — Z79899 Other long term (current) drug therapy: Secondary | ICD-10-CM | POA: Diagnosis not present

## 2015-04-27 DIAGNOSIS — I129 Hypertensive chronic kidney disease with stage 1 through stage 4 chronic kidney disease, or unspecified chronic kidney disease: Secondary | ICD-10-CM | POA: Diagnosis not present

## 2015-04-27 DIAGNOSIS — N189 Chronic kidney disease, unspecified: Secondary | ICD-10-CM | POA: Diagnosis not present

## 2015-04-27 DIAGNOSIS — R109 Unspecified abdominal pain: Secondary | ICD-10-CM | POA: Diagnosis present

## 2015-04-27 HISTORY — DX: Personal history of urinary (tract) infections: Z87.440

## 2015-04-27 HISTORY — PX: HOLMIUM LASER APPLICATION: SHX5852

## 2015-04-27 HISTORY — DX: Hyperlipidemia, unspecified: E78.5

## 2015-04-27 HISTORY — PX: CYSTOSCOPY WITH URETEROSCOPY AND STENT PLACEMENT: SHX6377

## 2015-04-27 HISTORY — DX: Personal history of other diseases of urinary system: Z87.448

## 2015-04-27 HISTORY — DX: Personal history of urinary calculi: Z87.442

## 2015-04-27 LAB — POCT I-STAT, CHEM 8
BUN: 13 mg/dL (ref 6–20)
CALCIUM ION: 1.57 mmol/L — AB (ref 1.13–1.30)
CREATININE: 0.6 mg/dL (ref 0.44–1.00)
Chloride: 105 mmol/L (ref 101–111)
GLUCOSE: 133 mg/dL — AB (ref 65–99)
HCT: 43 % (ref 36.0–46.0)
HEMOGLOBIN: 14.6 g/dL (ref 12.0–15.0)
Potassium: 4 mmol/L (ref 3.5–5.1)
Sodium: 142 mmol/L (ref 135–145)
TCO2: 25 mmol/L (ref 0–100)

## 2015-04-27 SURGERY — CYSTOURETEROSCOPY, WITH STENT INSERTION
Anesthesia: General | Laterality: Right

## 2015-04-27 MED ORDER — MEPERIDINE HCL 25 MG/ML IJ SOLN
6.2500 mg | INTRAMUSCULAR | Status: DC | PRN
  Filled 2015-04-27: qty 1

## 2015-04-27 MED ORDER — LACTATED RINGERS IV SOLN
INTRAVENOUS | Status: DC
  Administered 2015-04-27: 11:00:00 via INTRAVENOUS
  Filled 2015-04-27: qty 1000

## 2015-04-27 MED ORDER — PROMETHAZINE HCL 25 MG/ML IJ SOLN
6.2500 mg | INTRAMUSCULAR | Status: DC | PRN
  Filled 2015-04-27: qty 1

## 2015-04-27 MED ORDER — ONDANSETRON HCL 4 MG/2ML IJ SOLN
INTRAMUSCULAR | Status: DC | PRN
  Administered 2015-04-27: 4 mg via INTRAVENOUS

## 2015-04-27 MED ORDER — FENTANYL CITRATE (PF) 100 MCG/2ML IJ SOLN
INTRAMUSCULAR | Status: AC
  Filled 2015-04-27: qty 4

## 2015-04-27 MED ORDER — PROPOFOL 10 MG/ML IV BOLUS
INTRAVENOUS | Status: DC | PRN
  Administered 2015-04-27: 150 mg via INTRAVENOUS

## 2015-04-27 MED ORDER — LIDOCAINE HCL (CARDIAC) 20 MG/ML IV SOLN
INTRAVENOUS | Status: DC | PRN
  Administered 2015-04-27: 80 mg via INTRAVENOUS

## 2015-04-27 MED ORDER — CIPROFLOXACIN IN D5W 400 MG/200ML IV SOLN
400.0000 mg | INTRAVENOUS | Status: AC
  Administered 2015-04-27: 400 mg via INTRAVENOUS
  Filled 2015-04-27: qty 200

## 2015-04-27 MED ORDER — IOHEXOL 350 MG/ML SOLN
INTRAVENOUS | Status: DC | PRN
  Administered 2015-04-27: 4 mL via URETHRAL

## 2015-04-27 MED ORDER — FENTANYL CITRATE (PF) 100 MCG/2ML IJ SOLN
25.0000 ug | INTRAMUSCULAR | Status: DC | PRN
  Filled 2015-04-27: qty 1

## 2015-04-27 MED ORDER — SODIUM CHLORIDE 0.9 % IR SOLN
Status: DC | PRN
  Administered 2015-04-27: 4000 mL

## 2015-04-27 MED ORDER — FENTANYL CITRATE (PF) 100 MCG/2ML IJ SOLN
INTRAMUSCULAR | Status: DC | PRN
  Administered 2015-04-27: 50 ug via INTRAVENOUS

## 2015-04-27 MED ORDER — CIPROFLOXACIN IN D5W 400 MG/200ML IV SOLN
INTRAVENOUS | Status: AC
  Filled 2015-04-27: qty 200

## 2015-04-27 SURGICAL SUPPLY — 39 items
BAG DRAIN URO-CYSTO SKYTR STRL (DRAIN) ×3 IMPLANT
BAG DRN UROCATH (DRAIN) ×1
BASKET LASER NITINOL 1.9FR (BASKET) IMPLANT
BASKET SEGURA 3FR (UROLOGICAL SUPPLIES) IMPLANT
BASKET STNLS GEMINI 4WIRE 3FR (BASKET) IMPLANT
BASKET ZERO TIP NITINOL 2.4FR (BASKET) ×2 IMPLANT
BSKT STON RTRVL 120 1.9FR (BASKET)
BSKT STON RTRVL GEM 120X11 3FR (BASKET)
BSKT STON RTRVL ZERO TP 2.4FR (BASKET) ×1
CANISTER SUCT LVC 12 LTR MEDI- (MISCELLANEOUS) IMPLANT
CATH URET 5FR 28IN CONE TIP (BALLOONS)
CATH URET 5FR 28IN OPEN ENDED (CATHETERS) IMPLANT
CATH URET 5FR 70CM CONE TIP (BALLOONS) IMPLANT
CLOTH BEACON ORANGE TIMEOUT ST (SAFETY) ×3 IMPLANT
ELECT REM PT RETURN 9FT ADLT (ELECTROSURGICAL)
ELECTRODE REM PT RTRN 9FT ADLT (ELECTROSURGICAL) IMPLANT
FIBER LASER FLEXIVA 1000 (UROLOGICAL SUPPLIES) IMPLANT
FIBER LASER FLEXIVA 365 (UROLOGICAL SUPPLIES) IMPLANT
FIBER LASER FLEXIVA 550 (UROLOGICAL SUPPLIES) IMPLANT
FIBER LASER TRAC TIP (UROLOGICAL SUPPLIES) ×2 IMPLANT
GLOVE SURG SS PI 8.0 STRL IVOR (GLOVE) ×7 IMPLANT
GOWN STRL REUS W/ TWL LRG LVL3 (GOWN DISPOSABLE) ×1 IMPLANT
GOWN STRL REUS W/ TWL XL LVL3 (GOWN DISPOSABLE) ×1 IMPLANT
GOWN STRL REUS W/TWL LRG LVL3 (GOWN DISPOSABLE) ×6
GOWN STRL REUS W/TWL XL LVL3 (GOWN DISPOSABLE)
GUIDEWIRE 0.038 PTFE COATED (WIRE) IMPLANT
GUIDEWIRE ANG ZIPWIRE 038X150 (WIRE) IMPLANT
GUIDEWIRE STR DUAL SENSOR (WIRE) ×3 IMPLANT
IV NS IRRIG 3000ML ARTHROMATIC (IV SOLUTION) ×2 IMPLANT
KIT BALLIN UROMAX 15FX10 (LABEL) IMPLANT
KIT BALLN UROMAX 15FX4 (MISCELLANEOUS) IMPLANT
KIT BALLN UROMAX 26 75X4 (MISCELLANEOUS)
KIT ROOM TURNOVER WOR (KITS) ×3 IMPLANT
MANIFOLD NEPTUNE II (INSTRUMENTS) IMPLANT
PACK CYSTO (CUSTOM PROCEDURE TRAY) ×3 IMPLANT
SET HIGH PRES BAL DIL (LABEL)
SHEATH ACCESS URETERAL 38CM (SHEATH) IMPLANT
TUBE CONNECTING 12'X1/4 (SUCTIONS)
TUBE CONNECTING 12X1/4 (SUCTIONS) IMPLANT

## 2015-04-27 NOTE — Anesthesia Procedure Notes (Signed)
Procedure Name: LMA Insertion Date/Time: 04/27/2015 12:34 PM Performed by: Bethena Roys T Pre-anesthesia Checklist: Patient identified, Emergency Drugs available, Suction available and Patient being monitored Patient Re-evaluated:Patient Re-evaluated prior to inductionOxygen Delivery Method: Circle System Utilized Preoxygenation: Pre-oxygenation with 100% oxygen Intubation Type: IV induction Ventilation: Mask ventilation without difficulty LMA: LMA inserted LMA Size: 4.0 Number of attempts: 1 Airway Equipment and Method: Bite block Placement Confirmation: positive ETCO2 Dental Injury: Teeth and Oropharynx as per pre-operative assessment

## 2015-04-27 NOTE — Brief Op Note (Signed)
04/27/2015  1:06 PM  PATIENT:  Caitlin Mccarthy  79 y.o. female  PRE-OPERATIVE DIAGNOSIS:  RIGHT URETERAL STONE   POST-OPERATIVE DIAGNOSIS:  right ureteral stone  PROCEDURE:  Procedure(s): CYSTOSCOPY WITH RIGHT URETEROSCOPY AND STENT PLACEMENT (Right) HOLMIUM LASER APPLICATION (Right)  Removal of Nephrostomy tube  SURGEON:  Surgeon(s) and Role:    * Irine Seal, MD - Primary  PHYSICIAN ASSISTANT:   ASSISTANTS: none   ANESTHESIA:   general  EBL:     BLOOD ADMINISTERED:none  DRAINS: none   LOCAL MEDICATIONS USED:  NONE  SPECIMEN:  Source of Specimen:  right ureteral stone  DISPOSITION OF SPECIMEN:  to family  COUNTS:  YES  TOURNIQUET:  * No tourniquets in log *  DICTATION: .Other Dictation: Dictation Number [id number]  PLAN OF CARE: Discharge to home after PACU  PATIENT DISPOSITION:  PACU - hemodynamically stable.   Delay start of Pharmacological VTE agent (>24hrs) due to surgical blood loss or risk of bleeding: not applicable

## 2015-04-27 NOTE — Anesthesia Preprocedure Evaluation (Signed)
Anesthesia Evaluation  Patient identified by MRN, date of birth, ID band Patient awake    Reviewed: Allergy & Precautions, NPO status , Patient's Chart, lab work & pertinent test results  Airway Mallampati: II  TM Distance: >3 FB Neck ROM: Full    Dental no notable dental hx. (+) Caps   Pulmonary neg pulmonary ROS, Current Smoker,    Pulmonary exam normal breath sounds clear to auscultation       Cardiovascular hypertension, Pt. on medications Normal cardiovascular exam Rhythm:Regular Rate:Normal     Neuro/Psych negative neurological ROS  negative psych ROS   GI/Hepatic negative GI ROS, Neg liver ROS,   Endo/Other  negative endocrine ROS  Renal/GU Renal disease  negative genitourinary   Musculoskeletal negative musculoskeletal ROS (+)   Abdominal   Peds negative pediatric ROS (+)  Hematology negative hematology ROS (+)   Anesthesia Other Findings   Reproductive/Obstetrics negative OB ROS                             Anesthesia Physical Anesthesia Plan  ASA: III  Anesthesia Plan: General   Post-op Pain Management:    Induction: Intravenous  Airway Management Planned: LMA  Additional Equipment:   Intra-op Plan:   Post-operative Plan: Extubation in OR  Informed Consent: I have reviewed the patients History and Physical, chart, labs and discussed the procedure including the risks, benefits and alternatives for the proposed anesthesia with the patient or authorized representative who has indicated his/her understanding and acceptance.   Dental advisory given  Plan Discussed with: CRNA  Anesthesia Plan Comments:         Anesthesia Quick Evaluation

## 2015-04-27 NOTE — Discharge Instructions (Addendum)
CYSTOSCOPY HOME CARE INSTRUCTIONS  Activity: Rest for the remainder of the day.  Do not drive or operate equipment today.  You may resume normal activities in one to two days as instructed by your physician.   Meals: Drink plenty of liquids and eat light foods such as gelatin or soup this evening.  You may return to a normal meal plan tomorrow.  Return to Work: You may return to work in one to two days or as instructed by your physician.  Special Instructions / Symptoms: Call your physician if any of these symptoms occur:   -persistent or heavy bleeding  -bleeding which continues after first few urination  -large blood clots that are difficult to pass  -urine stream diminishes or stops completely  -fever equal to or higher than 101 degrees Farenheit.  -cloudy urine with a strong, foul odor  -severe pain  Females should always wipe from front to back after elimination.  You may feel some burning pain when you urinate.  This should disappear with time.  Applying moist heat to the lower abdomen or a hot tub bath may help relieve the pain. \  Please bring the stone to the office for analysis.     Post Anesthesia Home Care Instructions  Activity: Get plenty of rest for the remainder of the day. A responsible adult should stay with you for 24 hours following the procedure.  For the next 24 hours, DO NOT: -Drive a car -Paediatric nurse -Drink alcoholic beverages -Take any medication unless instructed by your physician -Make any legal decisions or sign important papers.  Meals: Start with liquid foods such as gelatin or soup. Progress to regular foods as tolerated. Avoid greasy, spicy, heavy foods. If nausea and/or vomiting occur, drink only clear liquids until the nausea and/or vomiting subsides. Call your physician if vomiting continues.  Special Instructions/Symptoms: Your throat may feel dry or sore from the anesthesia or the breathing tube placed in your throat during  surgery. If this causes discomfort, gargle with warm salt water. The discomfort should disappear within 24 hours.  If you had a scopolamine patch placed behind your ear for the management of post- operative nausea and/or vomiting:  1. The medication in the patch is effective for 72 hours, after which it should be removed.  Wrap patch in a tissue and discard in the trash. Wash hands thoroughly with soap and water. 2. You may remove the patch earlier than 72 hours if you experience unpleasant side effects which may include dry mouth, dizziness or visual disturbances. 3. Avoid touching the patch. Wash your hands with soap and water after contact with the patch.

## 2015-04-27 NOTE — Transfer of Care (Signed)
Immediate Anesthesia Transfer of Care Note  Patient: Caitlin Mccarthy  Procedure(s) Performed: Procedure(s): CYSTOSCOPY WITH RIGHT URETEROSCOPY, BASKET REMOVAL OF STONE, REMOVAL OF RIGHT NEPHROSTOMY TUBE (Right) HOLMIUM LASER APPLICATION (Right)  Patient Location: PACU  Anesthesia Type:General  Level of Consciousness: awake, alert  and oriented  Airway & Oxygen Therapy: Patient Spontanous Breathing and Patient connected to nasal cannula oxygen  Post-op Assessment: Report given to RN  Post vital signs: Reviewed and stable  Last Vitals:  Filed Vitals:   04/27/15 1056  BP: 140/70  Pulse: 85  Temp: 36.4 C  Resp: 16    Complications: None

## 2015-04-27 NOTE — Interval H&P Note (Signed)
History and Physical Interval Note:  04/27/2015 11:04 AM  Caitlin Mccarthy  has presented today for surgery, with the diagnosis of RIGHT URETERAL STONE   The various methods of treatment have been discussed with the patient and family. After consideration of risks, benefits and other options for treatment, the patient has consented to  Procedure(s): CYSTOSCOPY WITH RIGHT URETEROSCOPY AND STENT PLACEMENT (Right) HOLMIUM LASER APPLICATION (Right) as a surgical intervention .  The patient's history has been reviewed, patient examined, no change in status, stable for surgery.  I have reviewed the patient's chart and labs.  Questions were answered to the patient's satisfaction.     Marquee Fuchs J

## 2015-04-27 NOTE — Progress Notes (Signed)
Dr. Jeffie Pollock paged and called back regarding how to take Cipro at home. Patient to take Cipro 1 tablet once a day until prescription completed.

## 2015-04-27 NOTE — Anesthesia Postprocedure Evaluation (Signed)
  Anesthesia Post-op Note  Patient: Caitlin Mccarthy  Procedure(s) Performed: Procedure(s) (LRB): CYSTOSCOPY WITH RIGHT URETEROSCOPY, BASKET REMOVAL OF STONE, REMOVAL OF RIGHT NEPHROSTOMY TUBE (Right) HOLMIUM LASER APPLICATION (Right)  Patient Location: PACU  Anesthesia Type: General  Level of Consciousness: awake and alert   Airway and Oxygen Therapy: Patient Spontanous Breathing  Post-op Pain: mild  Post-op Assessment: Post-op Vital signs reviewed, Patient's Cardiovascular Status Stable, Respiratory Function Stable, Patent Airway and No signs of Nausea or vomiting  Last Vitals:  Filed Vitals:   04/27/15 1439  BP: 148/54  Pulse: 78  Temp: 36.6 C  Resp: 16    Post-op Vital Signs: stable   Complications: No apparent anesthesia complications

## 2015-04-28 ENCOUNTER — Encounter (HOSPITAL_BASED_OUTPATIENT_CLINIC_OR_DEPARTMENT_OTHER): Payer: Self-pay | Admitting: Urology

## 2015-04-28 DIAGNOSIS — B961 Klebsiella pneumoniae [K. pneumoniae] as the cause of diseases classified elsewhere: Secondary | ICD-10-CM | POA: Diagnosis not present

## 2015-04-28 DIAGNOSIS — N136 Pyonephrosis: Secondary | ICD-10-CM | POA: Diagnosis not present

## 2015-04-28 DIAGNOSIS — G894 Chronic pain syndrome: Secondary | ICD-10-CM | POA: Diagnosis not present

## 2015-04-28 DIAGNOSIS — Z7982 Long term (current) use of aspirin: Secondary | ICD-10-CM | POA: Diagnosis not present

## 2015-04-28 DIAGNOSIS — I10 Essential (primary) hypertension: Secondary | ICD-10-CM | POA: Diagnosis not present

## 2015-04-28 DIAGNOSIS — Z436 Encounter for attention to other artificial openings of urinary tract: Secondary | ICD-10-CM | POA: Diagnosis not present

## 2015-04-28 NOTE — Op Note (Signed)
NAMEMarland Kitchen  Caitlin, NEISWONGER             ACCOUNT NO.:  [id number]  MEDICAL RECORD NO.:  [id number]  LOCATION:                               FACILITY:  [facility]  PHYSICIAN:  Marshall Cork. Jeffie Pollock, M.D.    DATE OF BIRTH:  [birth year]  DATE OF PROCEDURE:  04/27/2015 DATE OF DISCHARGE:  04/27/2015                              OPERATIVE REPORT   PROCEDURE: 1. Cystoscopy with right retrograde pyelogram and interpretation. 2. Right ureteroscopic stone extraction with holmium laser     lithotripsy. 3. Removal of right nephrostomy tube.  PREOPERATIVE DIAGNOSIS:  Right distal ureteral stone.  POSTOPERATIVE DIAGNOSIS:  Right distal ureteral stone.  SURGEON:  Marshall Cork. Jeffie Pollock, M.D.  ANESTHESIA:  General.  SPECIMENS:  Stone.  DRAINS:  None.  BLOOD LOSS:  None.  COMPLICATIONS:  None.  INDICATIONS:  [name] is a 79 year old, white female, who was originally seen with right ureteral stone in the mid to distal ureter with infection.  She underwent placement of a nephrostomy tube for decompression.  She returns now for definitive therapy.  FINDINGS AND PROCEDURE:  She was taken to the operating room where general anesthetic was induced.  She was given Cipro.  She was fitted with PAS hose and placed in lithotomy position.  Her perineum and genitalia were prepped with Betadine solution.  She was draped in usual sterile fashion.  Cystoscopy was performed using a 23-French scope and 30-degree lens. Examination revealed a normal urethra.  The bladder wall was smooth and pale without tumor, stones, or inflammation.  Ureteral orifices were unremarkable.  The right ureteral orifice was cannulated with 5-French open-end catheter and contrast was instilled.  Right retrograde pyelogram revealed a filling defect approximately 3 cm proximal to meatus consistent with her stone.  There were no additional filling defects to the kidney.  Nephrostomy tube was noted in the kidney.  A 4.5-French semi-rigid  ureteroscope was then passed per urethra and was easily advanced up the ureter to the stone.  An initial attempt to remove the stone with Nitinol basket was unsuccessful due to some narrowing of the distal ureter.  I then passed a 200-micron laser fiber through the scope.  The laser was set on 0.5 watts and 10 Hz.  The stone was engaged and several fragments were chipped off the sides.  Once the stone was felt to be adequately reduced in size, the laser fiber was removed and the Nitinol basket was reinserted.  The stone was engaged and was removed without difficulty. Reinspection of the ureter revealed no retained fragments or significant mucosal irritation.  It was not felt that a stent was indicated.  At this point, the bladder was drained.  The patient was taken down from lithotomy position.  She was then rolled to the left and the nephrostomy tube was removed.  A dressing of 4x4s and a Tegaderm was applied.  Her anesthetic was then reversed and she was moved to the recovery room in stable condition.  There were no complications.  The stone was given to the family to bring it to the office for analysis.     Marshall Cork. Jeffie Pollock, M.D.     JJW/MEDQ  D:  04/27/2015  T:  04/28/2015  Job:  [id number]

## 2015-05-01 DIAGNOSIS — N136 Pyonephrosis: Secondary | ICD-10-CM | POA: Diagnosis not present

## 2015-05-01 DIAGNOSIS — Z7982 Long term (current) use of aspirin: Secondary | ICD-10-CM | POA: Diagnosis not present

## 2015-05-01 DIAGNOSIS — Z436 Encounter for attention to other artificial openings of urinary tract: Secondary | ICD-10-CM | POA: Diagnosis not present

## 2015-05-01 DIAGNOSIS — B961 Klebsiella pneumoniae [K. pneumoniae] as the cause of diseases classified elsewhere: Secondary | ICD-10-CM | POA: Diagnosis not present

## 2015-05-01 DIAGNOSIS — I10 Essential (primary) hypertension: Secondary | ICD-10-CM | POA: Diagnosis not present

## 2015-05-01 DIAGNOSIS — G894 Chronic pain syndrome: Secondary | ICD-10-CM | POA: Diagnosis not present

## 2015-05-03 DIAGNOSIS — Z436 Encounter for attention to other artificial openings of urinary tract: Secondary | ICD-10-CM | POA: Diagnosis not present

## 2015-05-03 DIAGNOSIS — Z7982 Long term (current) use of aspirin: Secondary | ICD-10-CM | POA: Diagnosis not present

## 2015-05-03 DIAGNOSIS — I10 Essential (primary) hypertension: Secondary | ICD-10-CM | POA: Diagnosis not present

## 2015-05-03 DIAGNOSIS — B961 Klebsiella pneumoniae [K. pneumoniae] as the cause of diseases classified elsewhere: Secondary | ICD-10-CM | POA: Diagnosis not present

## 2015-05-03 DIAGNOSIS — N136 Pyonephrosis: Secondary | ICD-10-CM | POA: Diagnosis not present

## 2015-05-03 DIAGNOSIS — G894 Chronic pain syndrome: Secondary | ICD-10-CM | POA: Diagnosis not present

## 2015-05-04 DIAGNOSIS — Z436 Encounter for attention to other artificial openings of urinary tract: Secondary | ICD-10-CM | POA: Diagnosis not present

## 2015-05-04 DIAGNOSIS — I10 Essential (primary) hypertension: Secondary | ICD-10-CM | POA: Diagnosis not present

## 2015-05-04 DIAGNOSIS — N201 Calculus of ureter: Secondary | ICD-10-CM | POA: Diagnosis not present

## 2015-05-04 DIAGNOSIS — B961 Klebsiella pneumoniae [K. pneumoniae] as the cause of diseases classified elsewhere: Secondary | ICD-10-CM | POA: Diagnosis not present

## 2015-05-04 DIAGNOSIS — G894 Chronic pain syndrome: Secondary | ICD-10-CM | POA: Diagnosis not present

## 2015-05-04 DIAGNOSIS — N136 Pyonephrosis: Secondary | ICD-10-CM | POA: Diagnosis not present

## 2015-05-04 DIAGNOSIS — Z8619 Personal history of other infectious and parasitic diseases: Secondary | ICD-10-CM | POA: Diagnosis not present

## 2015-05-04 DIAGNOSIS — Z7982 Long term (current) use of aspirin: Secondary | ICD-10-CM | POA: Diagnosis not present

## 2015-05-05 ENCOUNTER — Encounter (HOSPITAL_BASED_OUTPATIENT_CLINIC_OR_DEPARTMENT_OTHER): Payer: Self-pay | Admitting: Urology

## 2015-05-09 ENCOUNTER — Other Ambulatory Visit: Payer: Self-pay | Admitting: Nurse Practitioner

## 2015-05-10 ENCOUNTER — Other Ambulatory Visit: Payer: Self-pay | Admitting: Nurse Practitioner

## 2015-05-10 DIAGNOSIS — I10 Essential (primary) hypertension: Secondary | ICD-10-CM | POA: Diagnosis not present

## 2015-05-10 DIAGNOSIS — G894 Chronic pain syndrome: Secondary | ICD-10-CM | POA: Diagnosis not present

## 2015-05-10 DIAGNOSIS — Z436 Encounter for attention to other artificial openings of urinary tract: Secondary | ICD-10-CM | POA: Diagnosis not present

## 2015-05-10 DIAGNOSIS — Z7982 Long term (current) use of aspirin: Secondary | ICD-10-CM | POA: Diagnosis not present

## 2015-05-10 DIAGNOSIS — B961 Klebsiella pneumoniae [K. pneumoniae] as the cause of diseases classified elsewhere: Secondary | ICD-10-CM | POA: Diagnosis not present

## 2015-05-10 DIAGNOSIS — N136 Pyonephrosis: Secondary | ICD-10-CM | POA: Diagnosis not present

## 2015-05-10 MED ORDER — TRAMADOL HCL 50 MG PO TABS: 50.0000 mg | ORAL_TABLET | Freq: Three times a day (TID) | ORAL | Status: DC

## 2015-05-10 NOTE — Telephone Encounter (Signed)
Need to try to cut ultram back to BID if can

## 2015-05-10 NOTE — Telephone Encounter (Signed)
Patient aware.

## 2015-05-10 NOTE — Telephone Encounter (Signed)
Pt requesting refill on her tramadol, she has her 3 month follow up appt scheduled 11/29 at 9:00.

## 2015-05-11 DIAGNOSIS — I10 Essential (primary) hypertension: Secondary | ICD-10-CM | POA: Diagnosis not present

## 2015-05-11 DIAGNOSIS — B961 Klebsiella pneumoniae [K. pneumoniae] as the cause of diseases classified elsewhere: Secondary | ICD-10-CM | POA: Diagnosis not present

## 2015-05-11 DIAGNOSIS — N136 Pyonephrosis: Secondary | ICD-10-CM | POA: Diagnosis not present

## 2015-05-11 DIAGNOSIS — Z436 Encounter for attention to other artificial openings of urinary tract: Secondary | ICD-10-CM | POA: Diagnosis not present

## 2015-05-11 DIAGNOSIS — G894 Chronic pain syndrome: Secondary | ICD-10-CM | POA: Diagnosis not present

## 2015-05-11 DIAGNOSIS — Z7982 Long term (current) use of aspirin: Secondary | ICD-10-CM | POA: Diagnosis not present

## 2015-05-30 ENCOUNTER — Ambulatory Visit: Payer: Commercial Managed Care - HMO | Admitting: Nurse Practitioner

## 2015-05-31 ENCOUNTER — Encounter (INDEPENDENT_AMBULATORY_CARE_PROVIDER_SITE_OTHER): Payer: Commercial Managed Care - HMO | Admitting: Family Medicine

## 2015-05-31 DIAGNOSIS — Z436 Encounter for attention to other artificial openings of urinary tract: Secondary | ICD-10-CM | POA: Diagnosis not present

## 2015-05-31 DIAGNOSIS — Z471 Aftercare following joint replacement surgery: Secondary | ICD-10-CM | POA: Diagnosis not present

## 2015-05-31 DIAGNOSIS — Z96653 Presence of artificial knee joint, bilateral: Secondary | ICD-10-CM | POA: Diagnosis not present

## 2015-05-31 DIAGNOSIS — Z96641 Presence of right artificial hip joint: Secondary | ICD-10-CM | POA: Diagnosis not present

## 2015-05-31 DIAGNOSIS — N136 Pyonephrosis: Secondary | ICD-10-CM | POA: Diagnosis not present

## 2015-05-31 DIAGNOSIS — B961 Klebsiella pneumoniae [K. pneumoniae] as the cause of diseases classified elsewhere: Secondary | ICD-10-CM

## 2015-05-31 DIAGNOSIS — Z96652 Presence of left artificial knee joint: Secondary | ICD-10-CM | POA: Diagnosis not present

## 2015-05-31 DIAGNOSIS — Z96651 Presence of right artificial knee joint: Secondary | ICD-10-CM | POA: Diagnosis not present

## 2015-05-31 DIAGNOSIS — I10 Essential (primary) hypertension: Secondary | ICD-10-CM | POA: Diagnosis not present

## 2015-06-02 ENCOUNTER — Ambulatory Visit (INDEPENDENT_AMBULATORY_CARE_PROVIDER_SITE_OTHER): Payer: Commercial Managed Care - HMO | Admitting: Nurse Practitioner

## 2015-06-02 ENCOUNTER — Encounter: Payer: Self-pay | Admitting: Nurse Practitioner

## 2015-06-02 VITALS — BP 138/74 | HR 80 | Temp 96.9°F | Ht 70.0 in | Wt 211.0 lb

## 2015-06-02 DIAGNOSIS — F329 Major depressive disorder, single episode, unspecified: Secondary | ICD-10-CM

## 2015-06-02 DIAGNOSIS — F32A Depression, unspecified: Secondary | ICD-10-CM

## 2015-06-02 DIAGNOSIS — N3000 Acute cystitis without hematuria: Secondary | ICD-10-CM

## 2015-06-02 DIAGNOSIS — E785 Hyperlipidemia, unspecified: Secondary | ICD-10-CM | POA: Diagnosis not present

## 2015-06-02 DIAGNOSIS — M160 Bilateral primary osteoarthritis of hip: Secondary | ICD-10-CM

## 2015-06-02 DIAGNOSIS — E876 Hypokalemia: Secondary | ICD-10-CM | POA: Diagnosis not present

## 2015-06-02 DIAGNOSIS — I10 Essential (primary) hypertension: Secondary | ICD-10-CM

## 2015-06-02 DIAGNOSIS — K219 Gastro-esophageal reflux disease without esophagitis: Secondary | ICD-10-CM | POA: Diagnosis not present

## 2015-06-02 DIAGNOSIS — R300 Dysuria: Secondary | ICD-10-CM | POA: Diagnosis not present

## 2015-06-02 DIAGNOSIS — Z6831 Body mass index (BMI) 31.0-31.9, adult: Secondary | ICD-10-CM | POA: Diagnosis not present

## 2015-06-02 LAB — POCT URINALYSIS DIPSTICK
Bilirubin, UA: NEGATIVE
Glucose, UA: NEGATIVE
Ketones, UA: NEGATIVE
NITRITE UA: POSITIVE
PH UA: 6
Spec Grav, UA: 1.02
UROBILINOGEN UA: NEGATIVE

## 2015-06-02 LAB — POCT UA - MICROSCOPIC ONLY
CASTS, UR, LPF, POC: NEGATIVE
Crystals, Ur, HPF, POC: NEGATIVE
Epithelial cells, urine per micros: NEGATIVE
MUCUS UA: NEGATIVE
Yeast, UA: NEGATIVE

## 2015-06-02 MED ORDER — CITALOPRAM HYDROBROMIDE 40 MG PO TABS
40.0000 mg | ORAL_TABLET | Freq: Every day | ORAL | Status: DC
Start: 2015-06-02 — End: 2015-08-15

## 2015-06-02 MED ORDER — CIPROFLOXACIN HCL 500 MG PO TABS: 500.0000 mg | ORAL_TABLET | Freq: Two times a day (BID) | ORAL | Status: DC

## 2015-06-02 MED ORDER — TRAMADOL HCL 50 MG PO TABS: 50.0000 mg | ORAL_TABLET | Freq: Three times a day (TID) | ORAL | Status: DC

## 2015-06-02 NOTE — Progress Notes (Addendum)
Subjective:    Patient ID: Caitlin Mccarthy, female    DOB: 16-Sep-1935, 79 y.o.   MRN: [id number]   Patient here today for follow up of chronic medical problems.  Hypertension This is a chronic problem. The current episode started more than 1 year ago. The problem is unchanged. The problem is controlled. Pertinent negatives include no blurred vision, chest pain, palpitations, peripheral edema or shortness of breath. Risk factors for coronary artery disease include dyslipidemia, family history, obesity, smoking/tobacco exposure, post-menopausal state and sedentary lifestyle. Past treatments include calcium channel blockers, ACE inhibitors and diuretics. The current treatment provides moderate improvement. Hypertensive end-organ damage includes CAD/MI.  Hyperlipidemia This is a chronic problem. The current episode started more than 1 year ago. The problem is uncontrolled. Recent lipid tests were reviewed and are high. Exacerbating diseases include obesity. She has no history of diabetes or hypothyroidism. Pertinent negatives include no chest pain, myalgias or shortness of breath. Current antihyperlipidemic treatment includes statins. The current treatment provides moderate improvement of lipids. Compliance problems include adherence to diet and adherence to exercise.  Risk factors for coronary artery disease include dyslipidemia, hypertension, obesity, post-menopausal and a sedentary lifestyle.  GERD Omeprazole daily- keeps symptoms under control OA of hip/ THR Still has pain and uses ultram daily hypokalemia K+ daily- no c/o lower ext cramps. depression Under a lot of stress- feels like she needs to be on meds.  Review of Systems  Constitutional: Negative.   Eyes: Negative for blurred vision.  Respiratory: Negative.  Negative for shortness of breath.   Cardiovascular: Negative for chest pain and palpitations.  Genitourinary: Negative.   Musculoskeletal: Negative for myalgias.    Neurological: Negative.   Psychiatric/Behavioral: Negative.   All other systems reviewed and are negative.      Objective:   Physical Exam  Constitutional: She is oriented to person, place, and time. She appears well-developed and well-nourished.  HENT:  Nose: Nose normal.  Mouth/Throat: Oropharynx is clear and moist.  Eyes: EOM are normal.  Neck: Trachea normal, normal range of motion and full passive range of motion without pain. Neck supple. No JVD present. Carotid bruit is not present. No thyromegaly present.  Cardiovascular: Normal rate, regular rhythm, normal heart sounds and intact distal pulses.  Exam reveals no gallop and no friction rub.   No murmur heard. Pulmonary/Chest: Effort normal and breath sounds normal.  Abdominal: Soft. Bowel sounds are normal. She exhibits no distension and no mass. There is no tenderness.  Musculoskeletal: Normal range of motion.  Lymphadenopathy:    She has no cervical adenopathy.  Neurological: She is alert and oriented to person, place, and time. She has normal reflexes.  Skin: Skin is warm and dry.  Psychiatric: She has a normal mood and affect. Her behavior is normal. Judgment and thought content normal.   BP 138/74 mmHg  Pulse 80  Temp(Src) 96.9 F (36.1 C) (Oral)  Ht _0  (1.778 m)  Wt 211 lb (95.709 kg)  BMI 30.28 kg/m2        Assessment & Plan:  1. Dysuria - POCT urinalysis dipstick - POCT UA - Microscopic Only  2. Essential hypertension, benign Do not add slat to diet  3. Hyperlipidemia with target LDL less than 100 Low fat diet  4. BMI 31.0-31.9,adult Discussed diet and exercise for person with BMI >25 Will recheck weight in 3-6 months  5. Gastroesophageal reflux disease without esophagitis Avoid spicy foods Do not eat 2 hours prior to bedtime  6. Primary  osteoarthritis of both hips - traMADol (ULTRAM) 50 MG tablet; Take 1 tablet (50 mg total) by mouth 3 (three) times daily.  Dispense: 90 tablet; Refill:  0  7. Hypokalemia  8. Acute cystitis without hematuria Take medication as prescribe Cotton underwear Take shower not bath Cranberry juice, yogurt Force fluids AZO over the counter X2 days Culture pending RTO prn - ciprofloxacin (CIPRO) 500 MG tablet; Take 1 tablet (500 mg total) by mouth 2 (two) times daily.  Dispense: 10 tablet; Refill: 0  9. depression - celexa 34m daily- side effects discussed  Orders Placed This Encounter  Procedures  . Urine culture  . CMP14+EGFR  . Lipid panel  . POCT urinalysis dipstick  . POCT UA - Microscopic Only     Recheck in 3 months. Labs pending Health maintenance reviewed Diet and exercise encouraged Continue all meds Follow up  In 3 month   [place] FNP

## 2015-06-02 NOTE — Addendum Note (Signed)
Addended by: [name] on: 06/02/2015 03:24 PM   Modules accepted: Orders

## 2015-06-02 NOTE — Patient Instructions (Signed)
Asymptomatic Bacteriuria, Female Asymptomatic bacteriuria is the presence of a large number of bacteria in your urine without the usual symptoms of burning or frequent urination. The following conditions increase the risk of asymptomatic bacteriuria:  Diabetes mellitus.  Advanced age.  Pregnancy in the first trimester.  Kidney stones.  Kidney transplants.  Leaky kidney tube valve in young children (reflux). Treatment for this condition is not needed in most people and can lead to other problems such as too much yeast and growth of resistant bacteria. However, some people, such as pregnant women, do need treatment to prevent kidney infection. Asymptomatic bacteriuria in pregnancy is also associated with fetal growth restriction, premature labor, and newborn death. HOME CARE INSTRUCTIONS Monitor your condition for any changes. The following actions may help to relieve any discomfort you are feeling:  Drink enough water and fluids to keep your urine clear or pale yellow. Go to the bathroom more often to keep your bladder empty.  Keep the area around your vagina and rectum clean. Wipe yourself from front to back after urinating. SEEK IMMEDIATE MEDICAL CARE IF:  You develop signs of an infection such as:  Burning with urination.  Frequency of voiding.  Back pain.  Fever.  You have blood in the urine.  You develop a fever. MAKE SURE YOU:  Understand these instructions.  Will watch your condition.  Will get help right away if you are not doing well or get worse.   This information is not intended to replace advice given to you by your health care provider. Make sure you discuss any questions you have with your health care provider.   Document Released: 06/17/2005 Document Revised: 07/08/2014 Document Reviewed: 12/07/2012 Elsevier Interactive Patient Education 2016 [employer] Inc[employer]  

## 2015-06-02 NOTE — Addendum Note (Signed)
Addended by: [name] on: 06/02/2015 12:30 PM   Modules accepted: Orders

## 2015-06-03 LAB — LIPID PANEL
CHOL/HDL RATIO: 7.3 ratio — AB (ref 0.0–4.4)
Cholesterol, Total: 211 mg/dL — ABNORMAL HIGH (ref 100–199)
HDL: 29 mg/dL — AB (ref >39)
LDL Calculated: 126 mg/dL — ABNORMAL HIGH (ref 0–99)
Triglycerides: 279 mg/dL — ABNORMAL HIGH (ref 0–149)
VLDL Cholesterol Cal: 56 mg/dL — ABNORMAL HIGH (ref 5–40)

## 2015-06-03 LAB — CMP14+EGFR
A/G RATIO: 1 — AB (ref 1.1–2.5)
ALBUMIN: 4.3 g/dL (ref 3.5–4.8)
ALT: 21 IU/L (ref 0–32)
AST: 32 IU/L (ref 0–40)
Alkaline Phosphatase: 80 IU/L (ref 39–117)
BUN / CREAT RATIO: 29 — AB (ref 11–26)
BUN: 21 mg/dL (ref 8–27)
Bilirubin Total: 0.3 mg/dL (ref 0.0–1.2)
CALCIUM: 11.8 mg/dL — AB (ref 8.7–10.3)
CO2: 26 mmol/L (ref 18–29)
Chloride: 98 mmol/L (ref 97–106)
Creatinine, Ser: 0.72 mg/dL (ref 0.57–1.00)
GFR, EST AFRICAN AMERICAN: 94 mL/min/{1.73_m2} (ref >59)
GFR, EST NON AFRICAN AMERICAN: 82 mL/min/{1.73_m2} (ref >59)
GLOBULIN, TOTAL: 4.1 g/dL (ref 1.5–4.5)
Glucose: 133 mg/dL — ABNORMAL HIGH (ref 65–99)
POTASSIUM: 4.2 mmol/L (ref 3.5–5.2)
SODIUM: 138 mmol/L (ref 136–144)
TOTAL PROTEIN: 8.4 g/dL (ref 6.0–8.5)

## 2015-06-05 LAB — URINE CULTURE

## 2015-06-10 ENCOUNTER — Other Ambulatory Visit: Payer: Self-pay | Admitting: Nurse Practitioner

## 2015-06-10 MED ORDER — SULFAMETHOXAZOLE-TRIMETHOPRIM 800-160 MG PO TABS: 1.0000 | ORAL_TABLET | Freq: Two times a day (BID) | ORAL | Status: DC

## 2015-07-06 ENCOUNTER — Ambulatory Visit: Payer: Commercial Managed Care - HMO | Admitting: Family Medicine

## 2015-07-06 ENCOUNTER — Encounter: Payer: Self-pay | Admitting: Family

## 2015-07-06 ENCOUNTER — Ambulatory Visit (INDEPENDENT_AMBULATORY_CARE_PROVIDER_SITE_OTHER): Payer: Commercial Managed Care - HMO | Admitting: Family

## 2015-07-06 VITALS — BP 116/64 | HR 82 | Temp 96.8°F | Ht 70.0 in | Wt 214.0 lb

## 2015-07-06 DIAGNOSIS — R300 Dysuria: Secondary | ICD-10-CM | POA: Diagnosis not present

## 2015-07-06 DIAGNOSIS — N3001 Acute cystitis with hematuria: Secondary | ICD-10-CM | POA: Diagnosis not present

## 2015-07-06 LAB — POCT URINALYSIS DIPSTICK
Bilirubin, UA: NEGATIVE
Glucose, UA: NEGATIVE
KETONES UA: NEGATIVE
NITRITE UA: POSITIVE
PH UA: 5
Spec Grav, UA: 1.015
Urobilinogen, UA: NEGATIVE

## 2015-07-06 LAB — POCT UA - MICROSCOPIC ONLY
CASTS, UR, LPF, POC: NEGATIVE
Crystals, Ur, HPF, POC: NEGATIVE
YEAST UA: NEGATIVE

## 2015-07-06 MED ORDER — LEVOFLOXACIN 500 MG PO TABS: 500.0000 mg | ORAL_TABLET | Freq: Every day | ORAL | Status: DC

## 2015-07-06 NOTE — Progress Notes (Signed)
   Subjective:    Patient ID: Caitlin Mccarthy, female    DOB: [birth year], 80 y.o.   MRN: [id number]  Dysuria  This is a new problem. The current episode started in the past 7 days. The problem occurs every urination. The problem has been gradually worsening. The quality of the pain is described as burning (pressure). The pain is at a severity of 4/10. The pain is mild. Associated symptoms include chills, hesitancy and urgency. Pertinent negatives include no flank pain, frequency, hematuria, nausea or vomiting. She has tried increased fluids for the symptoms. The treatment provided mild relief. Her past medical history is significant for recurrent UTIs.      Review of Systems  Constitutional: Positive for chills.  HENT: Negative.   Eyes: Negative.   Respiratory: Negative.  Negative for shortness of breath.   Cardiovascular: Negative.  Negative for palpitations.  Gastrointestinal: Negative.  Negative for nausea and vomiting.  Endocrine: Negative.   Genitourinary: Positive for dysuria, hesitancy and urgency. Negative for frequency, hematuria and flank pain.  Musculoskeletal: Negative.   Neurological: Negative.  Negative for headaches.  Hematological: Negative.   Psychiatric/Behavioral: Negative.   All other systems reviewed and are negative.      Objective:   Physical Exam  Constitutional: She is oriented to person, place, and time. She appears well-developed and well-nourished. No distress.  HENT:  Head: Normocephalic and atraumatic.  Eyes: Pupils are equal, round, and reactive to light.  Neck: Normal range of motion. Neck supple. No thyromegaly present.  Cardiovascular: Normal rate, regular rhythm, normal heart sounds and intact distal pulses.   No murmur heard. Pulmonary/Chest: Effort normal and breath sounds normal. No respiratory distress. She has no wheezes.  Abdominal: Soft. Bowel sounds are normal. She exhibits no distension. There is no tenderness.  Musculoskeletal:  Normal range of motion. She exhibits no edema or tenderness.  Negative for CVA tenderness   Neurological: She is alert and oriented to person, place, and time. She has normal reflexes. No cranial nerve deficit.  Skin: Skin is warm and dry.  Psychiatric: She has a normal mood and affect. Her behavior is normal. Judgment and thought content normal.  Vitals reviewed.     BP 116/64 mmHg  Pulse 82  Temp(Src) 96.8 F (36 C) (Oral)  Ht 5\' 10"  (1.778 m)  Wt 214 lb (97.07 kg)  BMI 30.71 kg/m2     Assessment & Plan:  1. Dysuria - POCT urinalysis dipstick - POCT UA - Microscopic Only  2. Acute cystitis with hematuria -Force fluids AZO over the counter X2 days RTO prn Culture pending - levofloxacin (LEVAQUIN) 500 MG tablet; Take 1 tablet (500 mg total) by mouth daily.  Dispense: 7 tablet; Refill: 0 - Urine culture  Evelina Dun, FNP

## 2015-07-06 NOTE — Patient Instructions (Signed)

## 2015-07-08 LAB — URINE CULTURE

## 2015-07-13 DIAGNOSIS — M25511 Pain in right shoulder: Secondary | ICD-10-CM | POA: Diagnosis not present

## 2015-07-13 DIAGNOSIS — R5383 Other fatigue: Secondary | ICD-10-CM | POA: Diagnosis not present

## 2015-07-13 DIAGNOSIS — M79641 Pain in right hand: Secondary | ICD-10-CM | POA: Diagnosis not present

## 2015-07-13 DIAGNOSIS — M25561 Pain in right knee: Secondary | ICD-10-CM | POA: Diagnosis not present

## 2015-07-13 DIAGNOSIS — M79642 Pain in left hand: Secondary | ICD-10-CM | POA: Diagnosis not present

## 2015-07-13 DIAGNOSIS — M25512 Pain in left shoulder: Secondary | ICD-10-CM | POA: Diagnosis not present

## 2015-07-17 DIAGNOSIS — Z79899 Other long term (current) drug therapy: Secondary | ICD-10-CM | POA: Diagnosis not present

## 2015-07-17 DIAGNOSIS — R5381 Other malaise: Secondary | ICD-10-CM | POA: Diagnosis not present

## 2015-07-17 DIAGNOSIS — E559 Vitamin D deficiency, unspecified: Secondary | ICD-10-CM | POA: Diagnosis not present

## 2015-07-17 DIAGNOSIS — R300 Dysuria: Secondary | ICD-10-CM | POA: Diagnosis not present

## 2015-07-17 DIAGNOSIS — Z0000 Encounter for general adult medical examination without abnormal findings: Secondary | ICD-10-CM | POA: Diagnosis not present

## 2015-07-17 DIAGNOSIS — Z87440 Personal history of urinary (tract) infections: Secondary | ICD-10-CM | POA: Diagnosis not present

## 2015-07-17 DIAGNOSIS — N200 Calculus of kidney: Secondary | ICD-10-CM | POA: Diagnosis not present

## 2015-07-17 DIAGNOSIS — N23 Unspecified renal colic: Secondary | ICD-10-CM | POA: Diagnosis not present

## 2015-07-17 DIAGNOSIS — M2550 Pain in unspecified joint: Secondary | ICD-10-CM | POA: Diagnosis not present

## 2015-07-21 ENCOUNTER — Ambulatory Visit: Payer: Commercial Managed Care - HMO | Admitting: Nurse Practitioner

## 2015-07-31 ENCOUNTER — Telehealth: Payer: Self-pay | Admitting: Oncology

## 2015-07-31 ENCOUNTER — Telehealth: Payer: Self-pay | Admitting: Nurse Practitioner

## 2015-07-31 NOTE — Telephone Encounter (Signed)
PT'S DAUGHTER CONFIRMED APPT AND PROVIDER INFO. CALLED PCP TO OBTAIN AUTH DUE TO [facility]

## 2015-08-01 ENCOUNTER — Telehealth: Payer: Self-pay | Admitting: Nurse Practitioner

## 2015-08-01 NOTE — Telephone Encounter (Signed)
Appointment given for tomorrow with Mary Curt, FNP.  

## 2015-08-02 ENCOUNTER — Ambulatory Visit (INDEPENDENT_AMBULATORY_CARE_PROVIDER_SITE_OTHER): Payer: Commercial Managed Care - HMO | Admitting: Nurse Practitioner

## 2015-08-02 ENCOUNTER — Encounter: Payer: Self-pay | Admitting: Nurse Practitioner

## 2015-08-02 VITALS — BP 150/75 | HR 74 | Temp 97.2°F | Ht 70.0 in | Wt 213.0 lb

## 2015-08-02 DIAGNOSIS — R300 Dysuria: Secondary | ICD-10-CM | POA: Diagnosis not present

## 2015-08-02 DIAGNOSIS — N3000 Acute cystitis without hematuria: Secondary | ICD-10-CM | POA: Diagnosis not present

## 2015-08-02 LAB — POCT UA - MICROSCOPIC ONLY
Casts, Ur, LPF, POC: NEGATIVE
Crystals, Ur, HPF, POC: NEGATIVE
MUCUS UA: NEGATIVE
YEAST UA: NEGATIVE

## 2015-08-02 LAB — POCT URINALYSIS DIPSTICK
Bilirubin, UA: NEGATIVE
Glucose, UA: NEGATIVE
Ketones, UA: NEGATIVE
Nitrite, UA: NEGATIVE
PROTEIN UA: NEGATIVE
Spec Grav, UA: 1.02
UROBILINOGEN UA: NEGATIVE
pH, UA: 5

## 2015-08-02 MED ORDER — CIPROFLOXACIN HCL 500 MG PO TABS: 500.0000 mg | ORAL_TABLET | Freq: Two times a day (BID) | ORAL | Status: DC

## 2015-08-02 NOTE — Patient Instructions (Signed)
Take medication as prescribe Cotton underwear Take shower not bath Cranberry juice, yogurt Force fluids AZO over the counter X2 days Culture pending RTO prn  

## 2015-08-02 NOTE — Progress Notes (Signed)
  Subjective:    Caitlin Mccarthy is a 80 y.o. female who complains of dysuria, frequency, nocturia and urgency. She has had symptoms for 5 days. Patient also complains of no other symptoms. Patient denies back pain, stomach ache and vaginal discharge. Patient does have a history of recurrent UTI. Patient does not have a history of pyelonephritis.   * Saw dr. Estil Daft and she suggested she be put on Cymbata for pain and depression- will rx  The following portions of the patient's history were reviewed and updated as appropriate: allergies, current medications, past family history, past medical history, past social history, past surgical history and problem list.  Review of Systems Pertinent items are noted in HPI.    Objective:    BP 150/75 mmHg  Pulse 74  Temp(Src) 97.2 F (36.2 C) (Oral)  Ht 5\' 10"  (1.778 m)  Wt 213 lb (96.616 kg)  BMI 30.56 kg/m2 General appearance: alert and cooperative Lungs: clear to auscultation bilaterally Heart: regular rate and rhythm, S1, S2 normal, no murmur, click, rub or gallop Abdomen: soft, non-tender; bowel sounds normal; no masses,  no organomegaly  Laboratory:     Assessment:    Acute cystitis     Plan:   Take medication as prescribe Cotton underwear Take shower not bath Cranberry juice, yogurt Force fluids AZO over the counter X2 days Culture pending RTO prn  Meds ordered this encounter  Medications  . ciprofloxacin (CIPRO) 500 MG tablet    Sig: Take 1 tablet (500 mg total) by mouth 2 (two) times daily.    Dispense:  10 tablet    Refill:  0    Order Specific Question:  Supervising Provider    Answer:  Chipper Herb [[id number]]   [place], FNP

## 2015-08-04 LAB — URINE CULTURE

## 2015-08-12 ENCOUNTER — Other Ambulatory Visit: Payer: Self-pay | Admitting: Family

## 2015-08-12 MED ORDER — NITROFURANTOIN MONOHYD MACRO 100 MG PO CAPS: 100.0000 mg | ORAL_CAPSULE | Freq: Two times a day (BID) | ORAL | Status: DC

## 2015-08-14 DIAGNOSIS — E559 Vitamin D deficiency, unspecified: Secondary | ICD-10-CM | POA: Diagnosis not present

## 2015-08-14 DIAGNOSIS — M19241 Secondary osteoarthritis, right hand: Secondary | ICD-10-CM | POA: Diagnosis not present

## 2015-08-14 DIAGNOSIS — M160 Bilateral primary osteoarthritis of hip: Secondary | ICD-10-CM | POA: Diagnosis not present

## 2015-08-14 DIAGNOSIS — M170 Bilateral primary osteoarthritis of knee: Secondary | ICD-10-CM | POA: Diagnosis not present

## 2015-08-15 ENCOUNTER — Ambulatory Visit (HOSPITAL_BASED_OUTPATIENT_CLINIC_OR_DEPARTMENT_OTHER): Payer: Commercial Managed Care - HMO

## 2015-08-15 ENCOUNTER — Ambulatory Visit (HOSPITAL_BASED_OUTPATIENT_CLINIC_OR_DEPARTMENT_OTHER): Payer: Commercial Managed Care - HMO | Admitting: Oncology

## 2015-08-15 ENCOUNTER — Telehealth: Payer: Self-pay | Admitting: Oncology

## 2015-08-15 ENCOUNTER — Ambulatory Visit (HOSPITAL_COMMUNITY)
Admission: RE | Admit: 2015-08-15 | Discharge: 2015-08-15 | Disposition: A | Discharge status: Home / Self Care | Payer: Commercial Managed Care - HMO | Source: Ambulatory Visit | Attending: Oncology | Admitting: Oncology

## 2015-08-15 VITALS — BP 169/49 | HR 75 | Temp 97.8°F | Resp 17 | Ht 70.0 in | Wt 216.1 lb

## 2015-08-15 DIAGNOSIS — I10 Essential (primary) hypertension: Secondary | ICD-10-CM

## 2015-08-15 DIAGNOSIS — E669 Obesity, unspecified: Secondary | ICD-10-CM

## 2015-08-15 DIAGNOSIS — N200 Calculus of kidney: Secondary | ICD-10-CM | POA: Insufficient documentation

## 2015-08-15 DIAGNOSIS — D472 Monoclonal gammopathy: Secondary | ICD-10-CM

## 2015-08-15 DIAGNOSIS — I700 Atherosclerosis of aorta: Secondary | ICD-10-CM | POA: Insufficient documentation

## 2015-08-15 DIAGNOSIS — M5136 Other intervertebral disc degeneration, lumbar region: Secondary | ICD-10-CM | POA: Insufficient documentation

## 2015-08-15 DIAGNOSIS — M47812 Spondylosis without myelopathy or radiculopathy, cervical region: Secondary | ICD-10-CM | POA: Diagnosis not present

## 2015-08-15 LAB — CBC WITH DIFFERENTIAL/PLATELET
BASO%: 0.7 % (ref 0.0–2.0)
Basophils Absolute: 0 10*3/uL (ref 0.0–0.1)
EOS%: 1.9 % (ref 0.0–7.0)
Eosinophils Absolute: 0.1 10*3/uL (ref 0.0–0.5)
HEMATOCRIT: 43.6 % (ref 34.8–46.6)
HEMOGLOBIN: 14.5 g/dL (ref 11.6–15.9)
LYMPH#: 3.4 10*3/uL — AB (ref 0.9–3.3)
LYMPH%: 50.6 % — ABNORMAL HIGH (ref 14.0–49.7)
MCH: 29.5 pg (ref 25.1–34.0)
MCHC: 33.2 g/dL (ref 31.5–36.0)
MCV: 88.8 fL (ref 79.5–101.0)
MONO#: 0.5 10*3/uL (ref 0.1–0.9)
MONO%: 8.2 % (ref 0.0–14.0)
NEUT#: 2.6 10*3/uL (ref 1.5–6.5)
NEUT%: 38.6 % (ref 38.4–76.8)
Platelets: 239 10*3/uL (ref 145–400)
RBC: 4.91 10*6/uL (ref 3.70–5.45)
RDW: 14 % (ref 11.2–14.5)
WBC: 6.7 10*3/uL (ref 3.9–10.3)

## 2015-08-15 LAB — COMPREHENSIVE METABOLIC PANEL
ALBUMIN: 4 g/dL (ref 3.5–5.0)
ALK PHOS: 66 U/L (ref 40–150)
ALT: 27 U/L (ref 0–55)
AST: 27 U/L (ref 5–34)
Anion Gap: 11 mEq/L (ref 3–11)
BUN: 18.8 mg/dL (ref 7.0–26.0)
CALCIUM: 11.5 mg/dL — AB (ref 8.4–10.4)
CHLORIDE: 101 meq/L (ref 98–109)
CO2: 26 mEq/L (ref 22–29)
CREATININE: 0.9 mg/dL (ref 0.6–1.1)
EGFR: 63 mL/min/{1.73_m2} — ABNORMAL LOW (ref >90)
Glucose: 128 mg/dl (ref 70–140)
Potassium: 3.4 mEq/L — ABNORMAL LOW (ref 3.5–5.1)
Sodium: 137 mEq/L (ref 136–145)
Total Bilirubin: 0.47 mg/dL (ref 0.20–1.20)
Total Protein: 8.6 g/dL — ABNORMAL HIGH (ref 6.4–8.3)

## 2015-08-15 NOTE — Consult Note (Signed)
Reason for Referral: Plasma cell disorder evaluation.   HPI: 80 year old woman currently with history of hypertension, obesity and osteoarthritis centigrade for the evaluation for possible plasma cell disorder. Patient had osteoarthritis and have had a knee operation as well as total hip arthroplasty in 2013. She was evaluated by Dr. Estanislado Pandy for her arthritis and laboratory testing obtained on 07/17/2015 showed a normal CBC with a white cell count of 6.7, hemoglobin of 14 and a normal differential. She did have mildly elevated sedimentation rate at 42. Serum protein electrophoresis showed a possibility of a faint restricted been cannot completely exclude an M spike. Immunofixation picked up an IgG kappa potential monoclonal spike. Her most recent chemistries obtain on 06/02/2015 showed normal kidney function with creatinine of 0.7 to and normal liver function tests and electrolytes. Calcium is elevated at 11.8. She is reporting no new symptoms at this time. She does report a chronic knee pain and hip pain which have not changed dramatically. She still ambulating with the help of a walker and a cane. She has not had any recent falls or syncope. She did have a fall back in October 2016 and that time she developed urosepsis. No issues since that time. She continued to have reasonable performance status and able to prepare certainly meals. She relies on her daughters for cleaning the house. She does drive short distances. She has not reported any recent sinopulmonary infections. Has not reported any neuropathy.  She does not report any headaches, blurry vision, syncope or seizures. She does not report any fevers, chills or sweats. She does not report any cough, wheezing or hemoptysis. She does not report any nausea, vomiting, abdominal pain, hematochezia or melena. She does not report any frequency, urgency or hesitancy. She does not report any bleeding tendencies or lymphadenopathy. She does not report any  petechiae. Remaining review of systems unremarkable.   Past Medical History  Diagnosis Date  . Hypertension   . Anxiety   . Depression   . Arthritis   . GERD (gastroesophageal reflux disease)   . Sepsis secondary to UTI ([place]) 04/13/2015    Klebsiella bacteremia  . Hyperlipidemia   . History of kidney stones   . History of acute pyelonephritis     last episode 04-13-2015  . Right ureteral stone   . Episodic confusion     due to recent UTI  :  Past Surgical History  Procedure Laterality Date  . Knee arthroscopy Bilateral right 2005//  left ?  [name] Cataract extraction w/phaco  06/06/2011    Procedure: CATARACT EXTRACTION PHACO AND INTRAOCULAR LENS PLACEMENT (IOC);  Surgeon: Tonny Branch;  Location: [facility];  Service: Ophthalmology;  Laterality: Right;  CDE=12.77  . Cataract extraction w/phaco  06/27/2011    Procedure: CATARACT EXTRACTION PHACO AND INTRAOCULAR LENS PLACEMENT (IOC);  Surgeon: Tonny Branch;  Location: [facility];  Service: Ophthalmology;  Laterality: Left;  CDE:13.96  . Total hip arthroplasty  10/28/2011    Procedure: TOTAL HIP ARTHROPLASTY;  Surgeon: Gearlean Alf, MD;  Location: [facility];  Service: Orthopedics;  Laterality: Right;  . Total knee arthroplasty Bilateral left 03-09-2007//  right 2006  . Tubal ligation    . Extracorporeal shock wave lithotripsy  1980's  . Cystoscopy with ureteroscopy and stent placement Right 04/27/2015    Procedure: CYSTOSCOPY WITH RIGHT URETEROSCOPY, BASKET REMOVAL OF STONE, REMOVAL OF RIGHT NEPHROSTOMY TUBE;  Surgeon: Irine Seal, MD;  Location: [facility];  Service: Urology;  Laterality: Right;  . Holmium laser application Right  04/27/2015    Procedure: HOLMIUM LASER APPLICATION;  Surgeon: Irine Seal, MD;  Location: [facility];  Service: Urology;  Laterality: Right;  :   Current outpatient prescriptions:  .  amLODipine-benazepril (LOTREL) 10-40 MG per capsule, TAKE ONE CAPSULE EACH MORNING (Patient taking  differently: Take 1 capsule by mouth every morning. TAKE ONE CAPSULE EACH MORNING), Disp: 30 capsule, Rfl: 5 .  aspirin 81 MG tablet, Take 81 mg by mouth daily., Disp: , Rfl:  .  cyclobenzaprine (FLEXERIL) 5 MG tablet, TAKE ONE TABLET BY MOUTH THREE TIMES DAILY AS NEEDED FOR MUSCLE SPASM, Disp: 30 tablet, Rfl: 1 .  hydrochlorothiazide (HYDRODIURIL) 25 MG tablet, , Disp: , Rfl:  .  rosuvastatin (CRESTOR) 20 MG tablet, Take 1 tablet (20 mg total) by mouth at bedtime., Disp: 30 tablet, Rfl: 5 .  traMADol (ULTRAM) 50 MG tablet, Take 1 tablet (50 mg total) by mouth 3 (three) times daily., Disp: 90 tablet, Rfl: 0:  No Known Allergies:  Family History  Problem Relation Age of Onset  . Anesthesia problems Neg Hx   . Hypotension Neg Hx   . Malignant hyperthermia Neg Hx   . Pseudochol deficiency Neg Hx   . Kidney disease Mother   . Congestive Heart Failure Father   . Heart disease Brother   . Alcohol abuse Brother   :  Social History   Social History  . Marital Status: Married    Spouse Name: N/A  . Number of Children: N/A  . Years of Education: N/A   Occupational History  . Not on file.   Social History Main Topics  . Smoking status: Current Every Day Smoker -- 0.50 packs/day for 40 years    Types: Cigarettes  . Smokeless tobacco: Never Used  . Alcohol Use: No  . Drug Use: No  . Sexual Activity: Not on file   Other Topics Concern  . Not on file   Social History Narrative  :  Pertinent items are noted in HPI.  Exam: Blood pressure 169/49, pulse 75, temperature 97.8 F (36.6 C), temperature source Oral, resp. rate 17, height '5\' 10"'  (1.778 m), weight 216 lb 1.6 oz (98.022 kg), SpO2 95 %. General appearance: alert and cooperative. Without distress. Head: Normocephalic, without obvious abnormality Nose: Nares normal. Septum midline. Mucosa normal. No drainage or sinus tenderness. Throat: lips, mucosa, and tongue normal; teeth and gums normal Neck: no adenopathy Back:  negative Resp: clear to auscultation bilaterally Chest wall: no tenderness Cardio: Regular rate and rhythm without murmurs and gallops. GI: soft, non-tender; bowel sounds normal; no masses,  no organomegaly Extremities: extremities normal, atraumatic, no cyanosis or edema Pulses: 2+ and symmetric Skin: Skin color, texture, turgor normal. No rashes or lesions Lymph nodes: Cervical, supraclavicular, and axillary nodes normal.    CBC    Component Value Date/Time   WBC 9.8 04/17/2015 0646   RBC 3.61* 04/17/2015 0646   HGB 14.6 04/27/2015 1135   HCT 43.0 04/27/2015 1135   PLT 237 04/17/2015 0646   MCV 91.4 04/17/2015 0646   MCH 31.3 04/17/2015 0646   MCHC 34.2 04/17/2015 0646   RDW 12.9 04/17/2015 0646   LYMPHSABS 1.0 04/13/2015 1521   MONOABS 0.9 04/13/2015 1521   EOSABS 0.0 04/13/2015 1521   BASOSABS 0.0 04/13/2015 1521      Chemistry      Component Value Date/Time   NA 138 06/02/2015 1531   NA 142 04/27/2015 1135   K 4.2 06/02/2015 1531   CL 98  06/02/2015 1531   CO2 26 06/02/2015 1531   BUN 21 06/02/2015 1531   BUN 13 04/27/2015 1135   CREATININE 0.72 06/02/2015 1531   CREATININE 0.65 10/02/2012 1301      Component Value Date/Time   CALCIUM 11.8* 06/02/2015 1531   ALKPHOS 80 06/02/2015 1531   AST 32 06/02/2015 1531   ALT 21 06/02/2015 1531   BILITOT 0.3 06/02/2015 1531   BILITOT 1.1 04/13/2015 1521         Assessment and Plan:   80 year old woman with the following issues:  1. 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 Her M spike is very faint and likely less than 1 g/dL. Immunofixation did pick up on the potential presence of IgG kappa monoclonal protein. The differential diagnosis was discussed today including reactive finding related to autoimmune disease, plasma cell disorder such as MGUS, multiple myeloma or amyloidosis.  I see no clear-cut signs of symptoms of end organ damage. She had multiple imaging studies including x-rays as well as CT  scan of the chest abdomen and pelvis in October 2016. These imaging studies to detect any myeloma lesions.  For completeness sake, I will repeat serum protein electrophoresis with quantitative immunoglobulins. I will also obtain serum light chains to complete the workup. A skeletal survey will be helpful to rule out any bone lesions and areas that have not been imaged before.  In all likelihood we are dealing with reactive findings and possibly MGUS. I doubt there is a malignant disorder at this time.  Once her workup is complete we'll communicate these findings. I do think we need to repeat her protein studies in 6 months to ensure stability.  2. Hypercalcemia: Her calcium fluctuated for the last 4 years close to 11. She has normal renal function and I do not think this is a sign of multiple myeloma and bone involvement. Do not think this is hypercalcemia of malignancy given the multiple imaging studies failed to show any occult cancer. Scattered related to calcium supplements that she has taken in the past. Could also be related to her antihypertensive medications. Once her myeloma workup is complete, calcium will be monitored by her primary care physician.  3. Follow-up: Will be in 6 months to repeat laboratory testing sooner if needed to.

## 2015-08-15 NOTE — Telephone Encounter (Signed)
per pof to sch pt appt-sent pt back to lab-gave pt copy of avs °

## 2015-08-15 NOTE — Progress Notes (Signed)
Please see consult note.  

## 2015-08-16 LAB — KAPPA/LAMBDA LIGHT CHAINS
IG KAPPA FREE LIGHT CHAIN: 49.51 mg/L — AB (ref 3.30–19.40)
IG LAMBDA FREE LIGHT CHAIN: 20.3 mg/L (ref 5.71–26.30)
Kappa/Lambda FluidC Ratio: 2.44 — ABNORMAL HIGH (ref 0.26–1.65)

## 2015-08-17 ENCOUNTER — Ambulatory Visit (INDEPENDENT_AMBULATORY_CARE_PROVIDER_SITE_OTHER): Payer: Commercial Managed Care - HMO | Admitting: Nurse Practitioner

## 2015-08-17 ENCOUNTER — Encounter: Payer: Self-pay | Admitting: Nurse Practitioner

## 2015-08-17 VITALS — BP 134/67 | HR 79 | Temp 97.5°F | Ht 70.0 in | Wt 215.0 lb

## 2015-08-17 DIAGNOSIS — M160 Bilateral primary osteoarthritis of hip: Secondary | ICD-10-CM | POA: Diagnosis not present

## 2015-08-17 DIAGNOSIS — N3000 Acute cystitis without hematuria: Secondary | ICD-10-CM

## 2015-08-17 DIAGNOSIS — R300 Dysuria: Secondary | ICD-10-CM

## 2015-08-17 LAB — MULTIPLE MYELOMA PANEL, SERUM
Albumin SerPl Elph-Mcnc: 3.9 g/dL (ref 2.9–4.4)
Albumin/Glob SerPl: 1.1 (ref 0.7–1.7)
Alpha 1: 0.1 g/dL (ref 0.0–0.4)
Alpha2 Glob SerPl Elph-Mcnc: 0.9 g/dL (ref 0.4–1.0)
B-GLOBULIN SERPL ELPH-MCNC: 1.6 g/dL — AB (ref 0.7–1.3)
GAMMA GLOB SERPL ELPH-MCNC: 1.3 g/dL (ref 0.4–1.8)
GLOBULIN, TOTAL: 3.9 g/dL (ref 2.2–3.9)
IgA, Qn, Serum: 348 mg/dL (ref 64–422)
IgG, Qn, Serum: 1393 mg/dL (ref 700–1600)
IgM, Qn, Serum: 37 mg/dL (ref 26–217)
Total Protein: 7.8 g/dL (ref 6.0–8.5)

## 2015-08-17 LAB — POCT URINALYSIS DIPSTICK
Bilirubin, UA: NEGATIVE
GLUCOSE UA: NEGATIVE
Ketones, UA: NEGATIVE
NITRITE UA: POSITIVE
Spec Grav, UA: 1.025
UROBILINOGEN UA: NEGATIVE
pH, UA: 5

## 2015-08-17 LAB — POCT UA - MICROSCOPIC ONLY
Casts, Ur, LPF, POC: NEGATIVE
Crystals, Ur, HPF, POC: NEGATIVE
Mucus, UA: NEGATIVE
Yeast, UA: NEGATIVE

## 2015-08-17 MED ORDER — CIPROFLOXACIN HCL 500 MG PO TABS: 500.0000 mg | ORAL_TABLET | Freq: Two times a day (BID) | ORAL | Status: DC

## 2015-08-17 MED ORDER — TRAMADOL HCL 50 MG PO TABS: 50.0000 mg | ORAL_TABLET | Freq: Three times a day (TID) | ORAL | Status: DC

## 2015-08-17 NOTE — Addendum Note (Signed)
Addended by: [name] on: 08/17/2015 02:35 PM   Modules accepted: Orders, SmartSet

## 2015-08-17 NOTE — Patient Instructions (Signed)
Asymptomatic Bacteriuria, Female Asymptomatic bacteriuria is the presence of a large number of bacteria in your urine without the usual symptoms of burning or frequent urination. The following conditions increase the risk of asymptomatic bacteriuria:  Diabetes mellitus.  Advanced age.  Pregnancy in the first trimester.  Kidney stones.  Kidney transplants.  Leaky kidney tube valve in young children (reflux). Treatment for this condition is not needed in most people and can lead to other problems such as too much yeast and growth of resistant bacteria. However, some people, such as pregnant women, do need treatment to prevent kidney infection. Asymptomatic bacteriuria in pregnancy is also associated with fetal growth restriction, premature labor, and newborn death. HOME CARE INSTRUCTIONS Monitor your condition for any changes. The following actions may help to relieve any discomfort you are feeling:  Drink enough water and fluids to keep your urine clear or pale yellow. Go to the bathroom more often to keep your bladder empty.  Keep the area around your vagina and rectum clean. Wipe yourself from front to back after urinating. SEEK IMMEDIATE MEDICAL CARE IF:  You develop signs of an infection such as:  Burning with urination.  Frequency of voiding.  Back pain.  Fever.  You have blood in the urine.  You develop a fever. MAKE SURE YOU:  Understand these instructions.  Will watch your condition.  Will get help right away if you are not doing well or get worse.   This information is not intended to replace advice given to you by your health care provider. Make sure you discuss any questions you have with your health care provider.   Document Released: 06/17/2005 Document Revised: 07/08/2014 Document Reviewed: 12/07/2012 Elsevier Interactive Patient Education 2016 [employer] Inc[employer]  

## 2015-08-17 NOTE — Progress Notes (Signed)
  Subjective:    Caitlin Mccarthy is a 80 y.o. female who complains of pressure in the pelvic area, minor burning. She has had symptoms for a few weeks. Patient also complains of fever, backache. Patient denies back pain, fever and vaginal discharge. Patient does have a history of recurrent UTI. Patient does not have a history of pyelonephritis.  * antibiotic was called in to [facility] on Saturday- still having symptoms. The following portions of the patient's history were reviewed and updated as appropriate: allergies, current medications, past family history, past medical history, past social history, past surgical history and problem list.  Review of Systems Pertinent items are noted in HPI. Pertinent items noted in HPI and remainder of comprehensive ROS otherwise negative.    Objective:    BP 134/67 mmHg  Pulse 79  Temp(Src) 97.5 F (36.4 C) (Oral)  Ht 5\' 10"  (1.778 m)  Wt 215 lb (97.523 kg)  BMI 30.85 kg/m2 General appearance: alert and cooperative Lungs: clear to auscultation bilaterally Heart: regular rate and rhythm, S1, S2 normal, no murmur, click, rub or gallop Abdomen: soft, non-tender; bowel sounds normal; no masses,  no organomegaly  Laboratory:  Results for orders placed or performed in visit on 08/17/15  POCT urinalysis dipstick  Result Value Ref Range   Color, UA gold    Clarity, UA cloudy    Glucose, UA negative    Bilirubin, UA negative    Ketones, UA negative    Spec Grav, UA 1.025    Blood, UA small    pH, UA 5.0    Protein, UA 4+    Urobilinogen, UA negative    Nitrite, UA positive    Leukocytes, UA large (3+) (A) Negative  POCT UA - Microscopic Only  Result Value Ref Range   WBC, Ur, HPF, POC 150-250    RBC, urine, microscopic 10-15    Bacteria, U Microscopic many    Mucus, UA negative    Epithelial cells, urine per micros few    Crystals, Ur, HPF, POC negative    Casts, Ur, LPF, POC negative    Yeast, UA negative      Assessment:    Acute  cystitis     Plan:   1. Dysuria   2. Acute cystitis without hematuria    Meds ordered this encounter  Medications  . ciprofloxacin (CIPRO) 500 MG tablet    Sig: Take 1 tablet (500 mg total) by mouth 2 (two) times daily.    Dispense:  20 tablet    Refill:  0    Order Specific Question:  Supervising Provider    Answer:  Chipper Herb [[id number]]   Take medication as prescribe Cotton underwear Take shower not bath Cranberry juice, yogurt Force fluids AZO over the counter X2 days Culture pending RTO prn  Mary-Margaret Hassell Done, FNP

## 2015-08-22 ENCOUNTER — Telehealth: Payer: Self-pay | Admitting: Nurse Practitioner

## 2015-08-22 ENCOUNTER — Other Ambulatory Visit: Payer: Self-pay | Admitting: Nurse Practitioner

## 2015-08-22 DIAGNOSIS — M799 Soft tissue disorder, unspecified: Secondary | ICD-10-CM

## 2015-08-22 DIAGNOSIS — M7989 Other specified soft tissue disorders: Secondary | ICD-10-CM

## 2015-08-22 NOTE — Telephone Encounter (Signed)
Patient would like a referral to dermatology for removal of spots on the top of her foot.

## 2015-08-22 NOTE — Telephone Encounter (Signed)
Referral made to dermatology.

## 2015-08-23 DIAGNOSIS — N390 Urinary tract infection, site not specified: Secondary | ICD-10-CM | POA: Diagnosis not present

## 2015-08-23 DIAGNOSIS — R300 Dysuria: Secondary | ICD-10-CM | POA: Diagnosis not present

## 2015-08-23 DIAGNOSIS — N200 Calculus of kidney: Secondary | ICD-10-CM | POA: Diagnosis not present

## 2015-08-23 DIAGNOSIS — Z87440 Personal history of urinary (tract) infections: Secondary | ICD-10-CM | POA: Diagnosis not present

## 2015-08-23 DIAGNOSIS — Z0000 Encounter for general adult medical examination without abnormal findings: Secondary | ICD-10-CM | POA: Diagnosis not present

## 2015-08-24 LAB — URINE CULTURE

## 2015-08-28 ENCOUNTER — Encounter: Payer: Self-pay | Admitting: *Deleted

## 2015-08-30 DIAGNOSIS — L820 Inflamed seborrheic keratosis: Secondary | ICD-10-CM | POA: Diagnosis not present

## 2015-08-30 DIAGNOSIS — D485 Neoplasm of uncertain behavior of skin: Secondary | ICD-10-CM | POA: Diagnosis not present

## 2015-08-30 DIAGNOSIS — Z85828 Personal history of other malignant neoplasm of skin: Secondary | ICD-10-CM | POA: Diagnosis not present

## 2015-10-19 IMAGING — DX DG CHEST 2V
2 series · 2 of 2 positions shown · non-contrast
Comparison: 10/22/2011

CLINICAL DATA: Weakness, multiple falls

EXAM:
CHEST  2 VIEW

[chest lat]
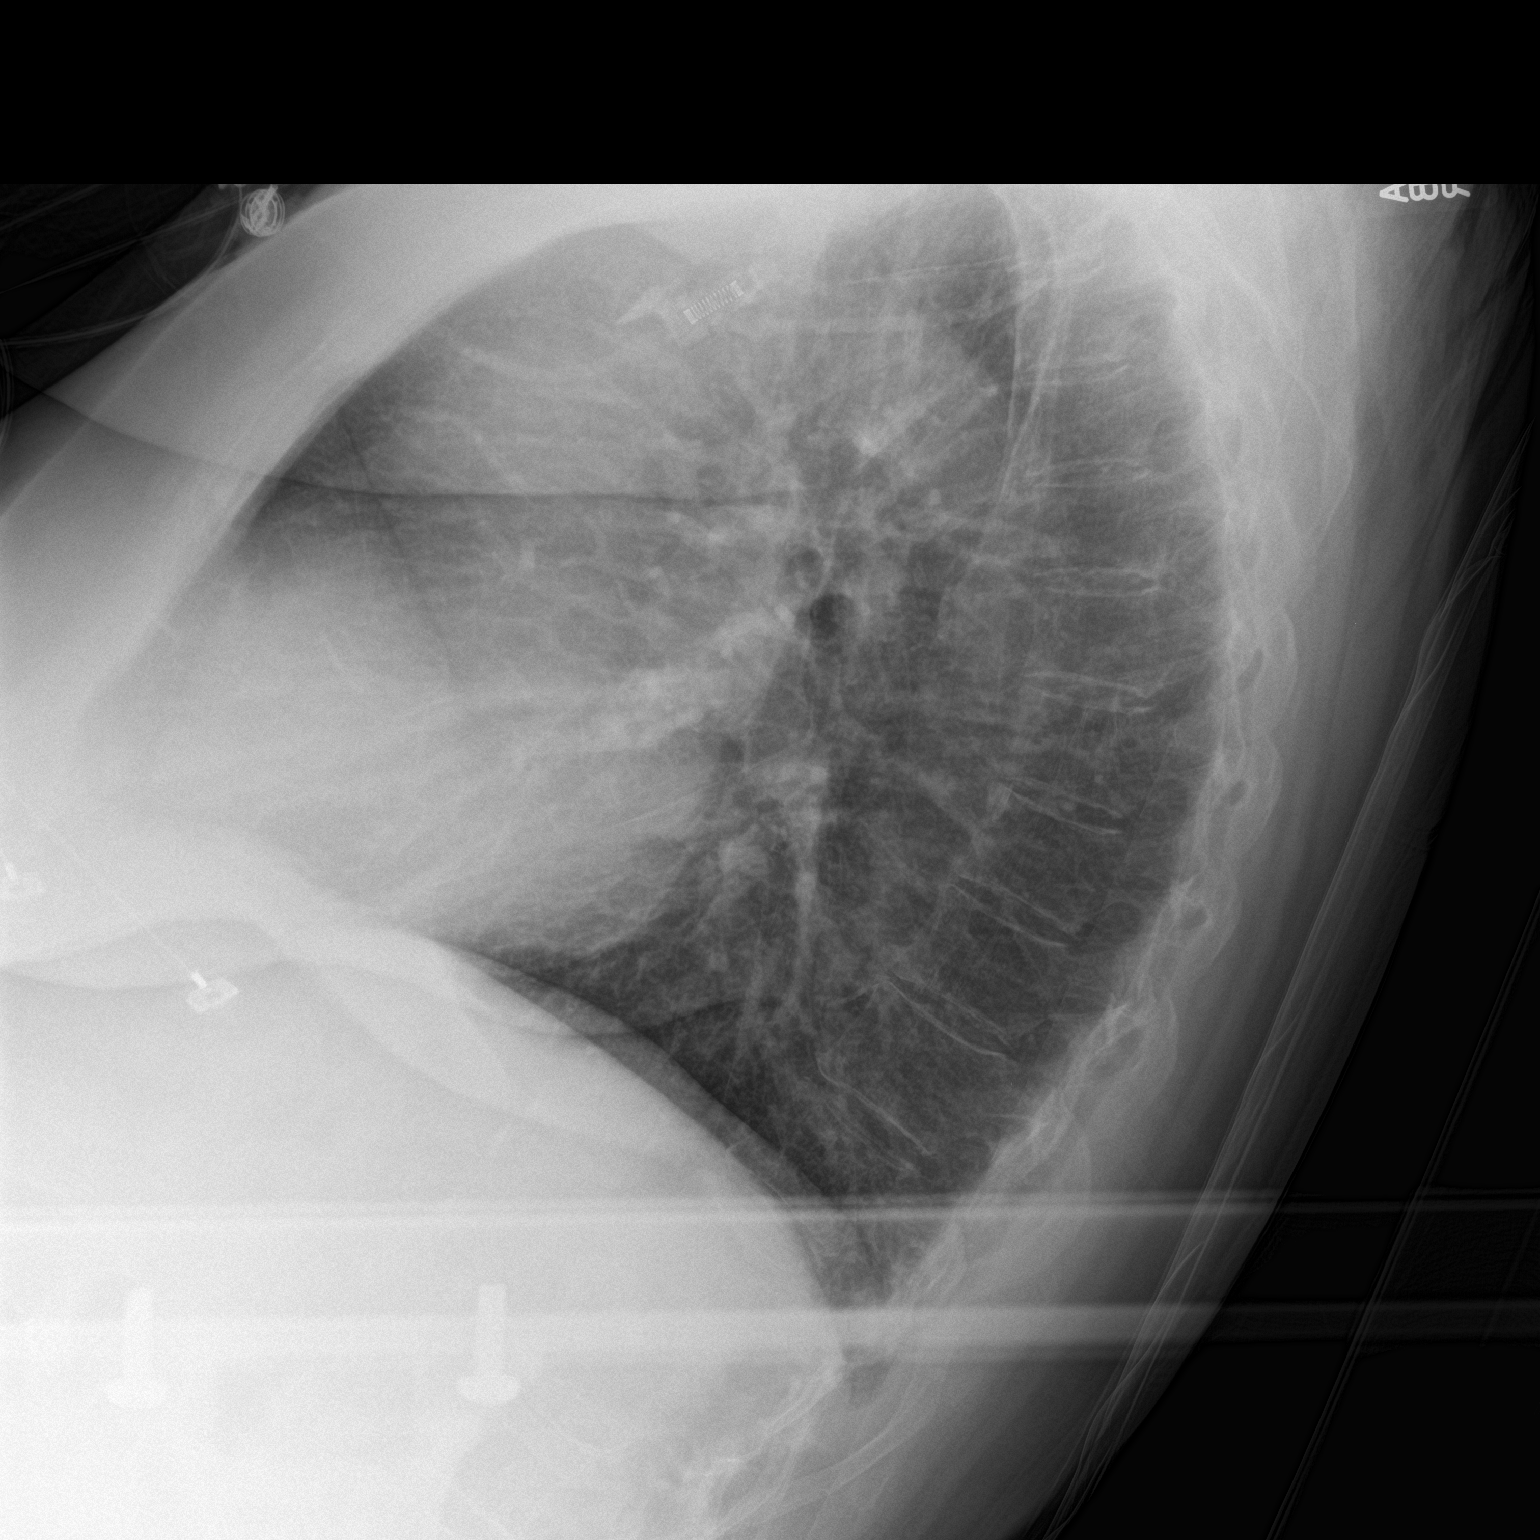

[chest ap]
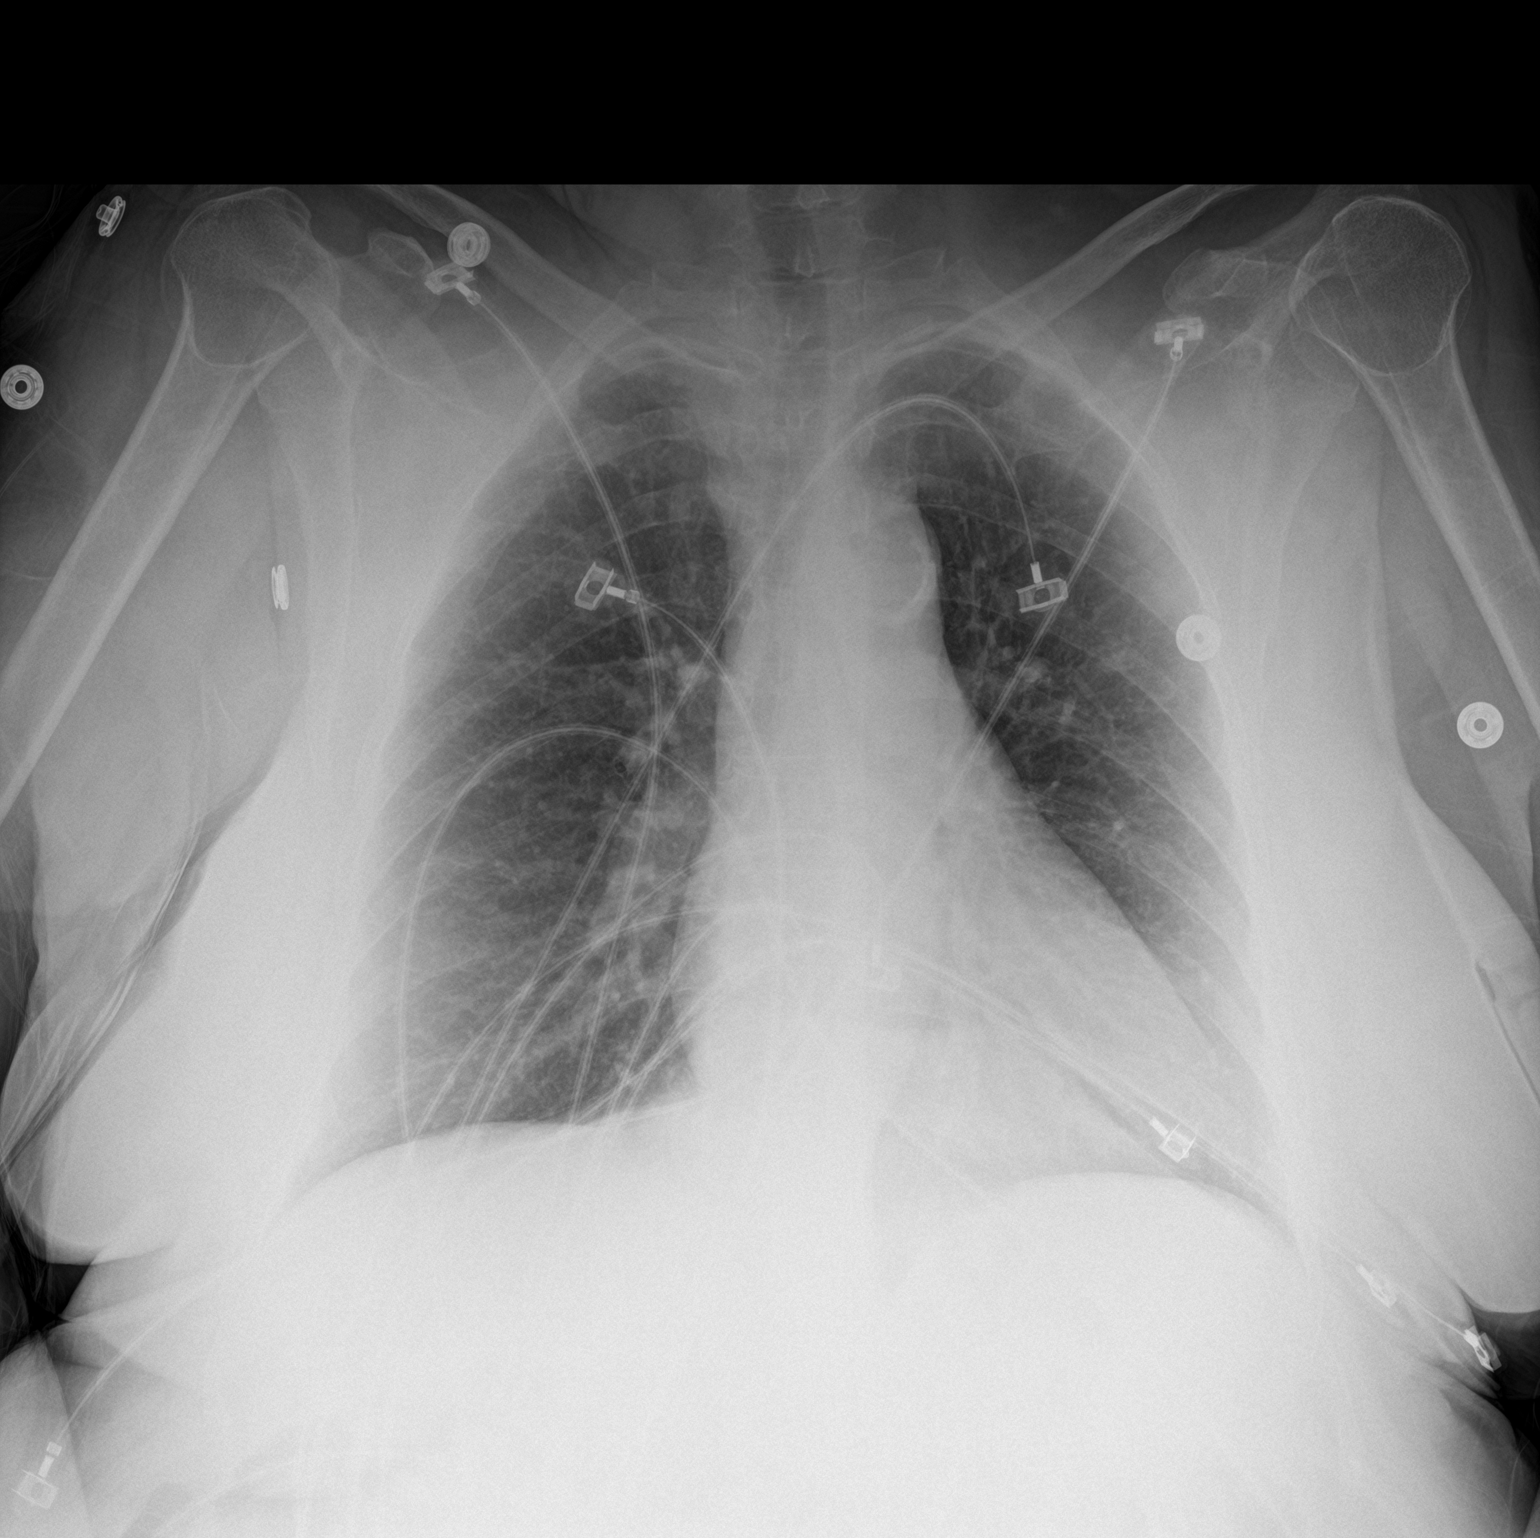

[2 of 2 positions shown; findings below may reference images not displayed]

FINDINGS: Cardiomediastinal silhouette is stable. No acute infiltrate or
pleural effusion. No pulmonary edema. Thoracic spine osteopenia.
IMPRESSION: No active cardiopulmonary disease.

## 2015-10-19 IMAGING — CT CT HEAD W/O CM
2 of 3 series · 16 of 30 positions shown, 19 images · non-contrast
Comparison: None.

CLINICAL DATA: Altered mental status.  Hypertension.

EXAM:
CT HEAD WITHOUT CONTRAST
TECHNIQUE: Contiguous axial images were obtained from the base of the skull
through the vertex without intravenous contrast.

[Series 2: headseq 4.8 h37s · axial · 0.46mm/px · z∈[+1470,+1607]mm · 10 of 36 slices shown, 13 images (1 of 2)]
[im 4/36  brain]
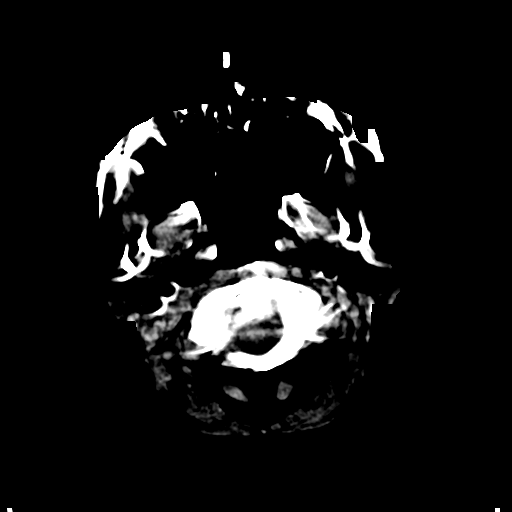
[im 4/36  bone]
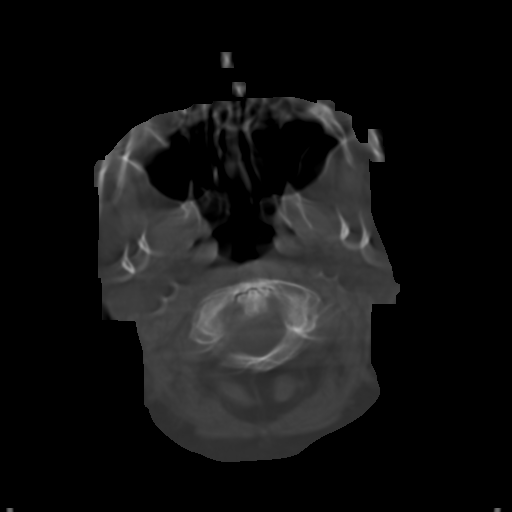
[im 7/36  brain]
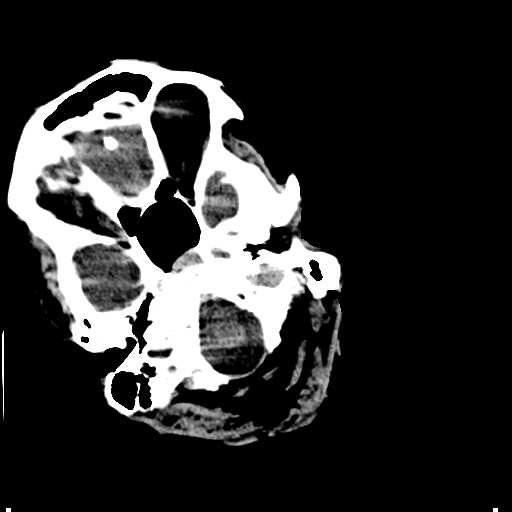
[im 10/36  brain]
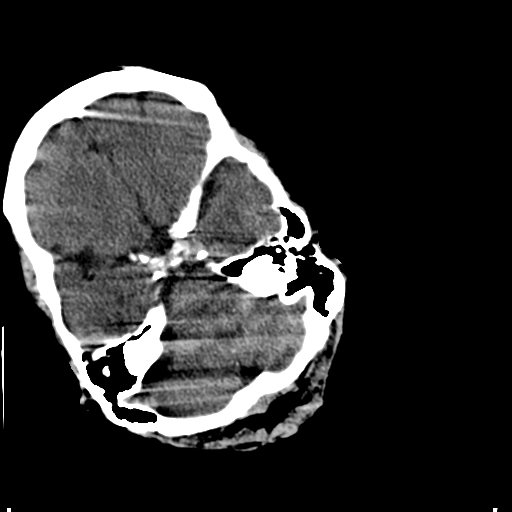
[im 13/36  brain]
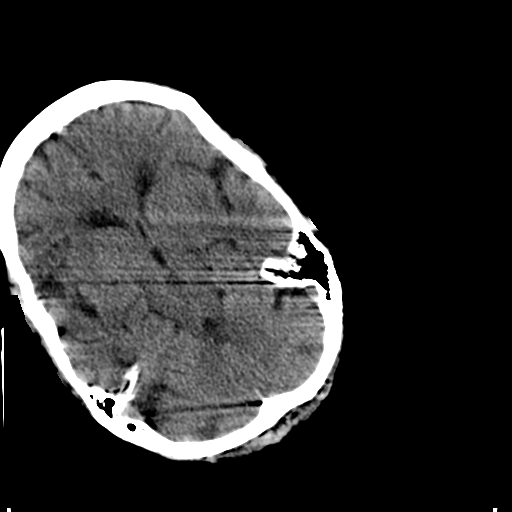
[im 16/36  brain]
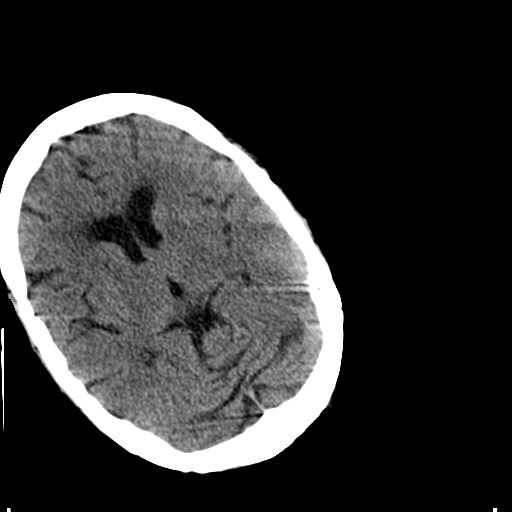
[im 16/36  bone]
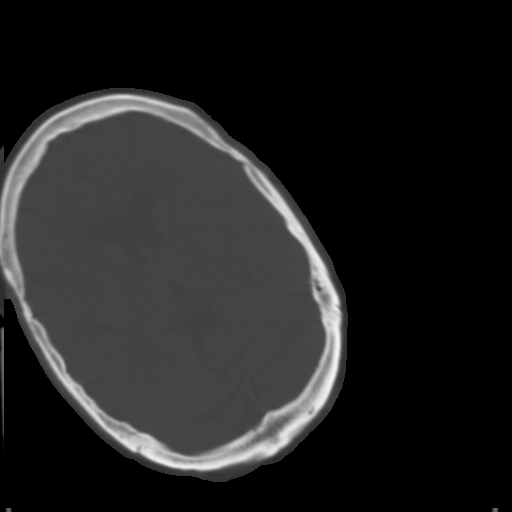
[im 20/36  brain]
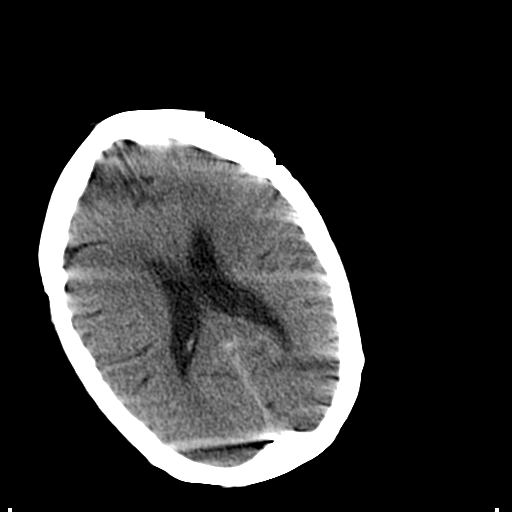
[im 23/36  brain]
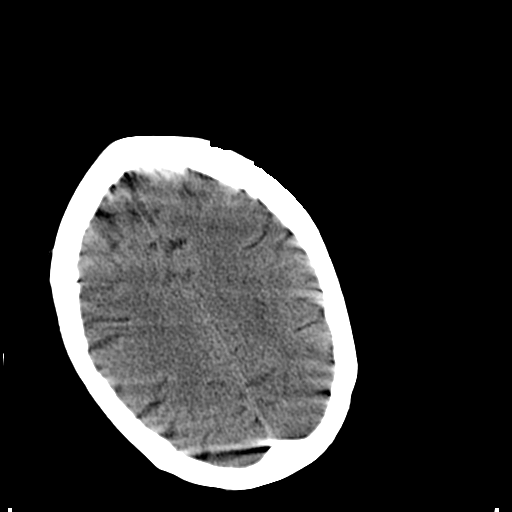
[im 26/36  brain]
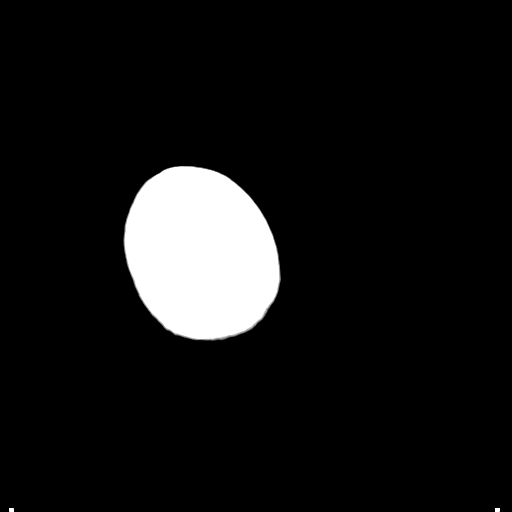
[im 29/36  brain]
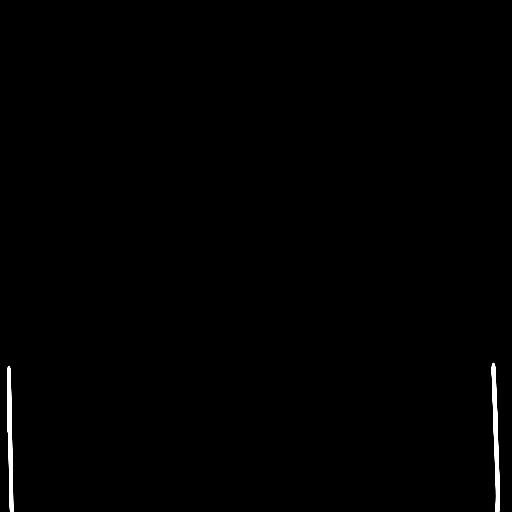
[im 29/36  bone]
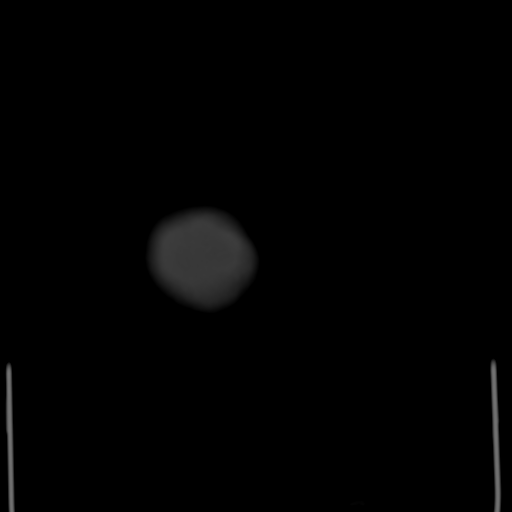
[im 32/36  brain]
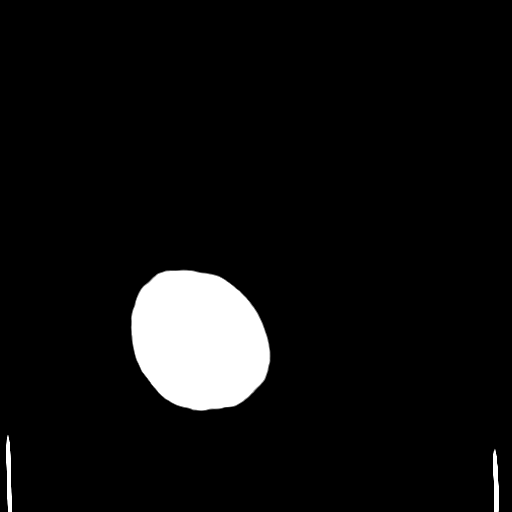

[Series 4: headseq 4.8 h37s · axial · 0.46mm/px · z∈[+1470,+1563]mm · 6 of 36 slices shown (2 of 2)]
[im 4/36  brain]
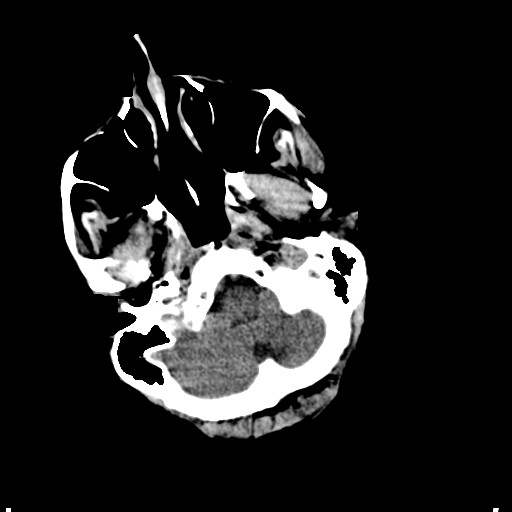
[im 7/36  brain]
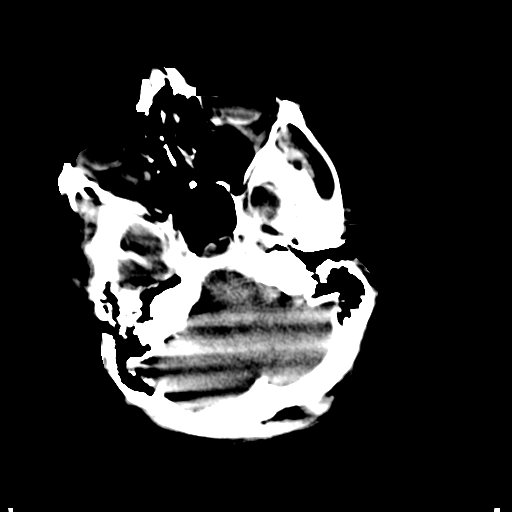
[im 13/36  brain]
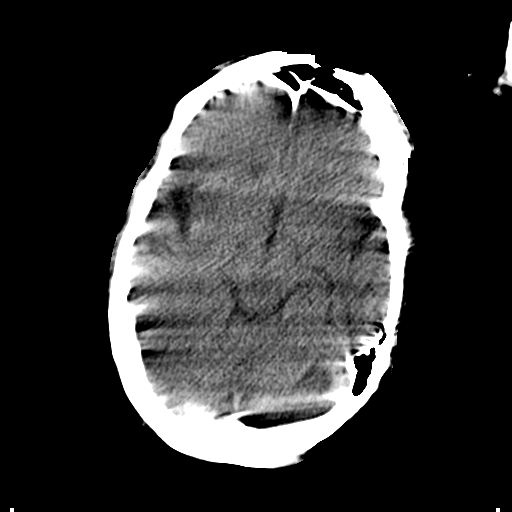
[im 16/36  brain]
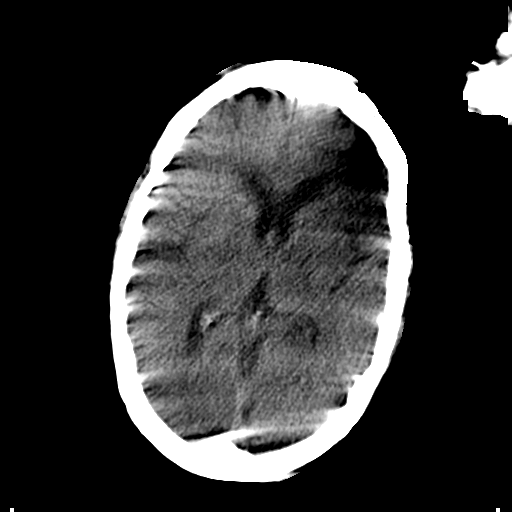
[im 20/36  brain]
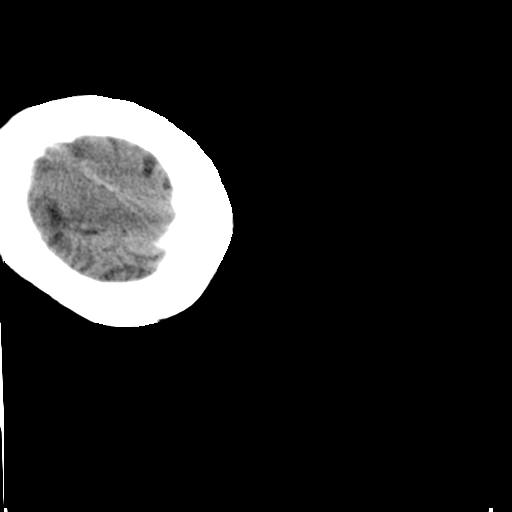
[im 23/36  brain]
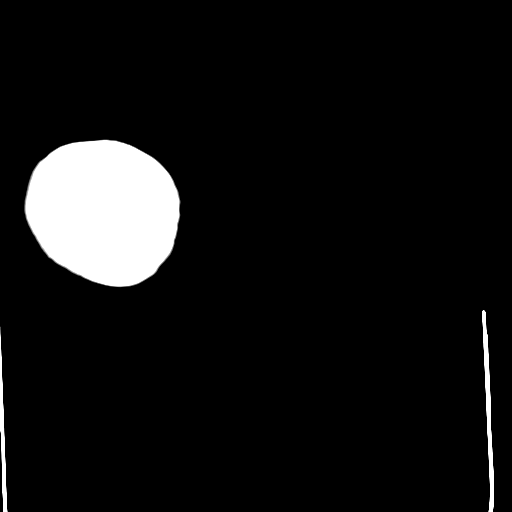

[16 of 30 positions shown; findings below may reference images not displayed]

FINDINGS: Study is limited due to extension motion artifact despite several
attempts to image the entire intracranial contents. There is age
related volume loss. No appreciable mass, hemorrhage, extra-axial
fluid collection, or midline shift is appreciable on this limited
study. There is mild small vessel disease in the centra semiovale
bilaterally. No acute infarct is demonstrable on this study. Bony
calvarium appears grossly intact. The mastoid air cells appear
clear.
IMPRESSION: Limited study due to patient motion. Age related volume loss with
mild periventricular small vessel disease. No acute infarct is
appreciable on this less than optimal study. No hemorrhage or mass
effect apparent.

## 2015-10-24 ENCOUNTER — Telehealth: Payer: Self-pay | Admitting: Nurse Practitioner

## 2015-10-24 DIAGNOSIS — M160 Bilateral primary osteoarthritis of hip: Secondary | ICD-10-CM

## 2015-10-24 MED ORDER — TRAMADOL HCL 50 MG PO TABS: 50.0000 mg | ORAL_TABLET | Freq: Three times a day (TID) | ORAL | Status: DC

## 2015-10-24 NOTE — Telephone Encounter (Signed)
tramadol rx ready for pick up

## 2015-10-24 NOTE — Telephone Encounter (Signed)
Pt is aware.  

## 2015-11-06 DIAGNOSIS — Z79899 Other long term (current) drug therapy: Secondary | ICD-10-CM | POA: Diagnosis not present

## 2015-11-06 DIAGNOSIS — E559 Vitamin D deficiency, unspecified: Secondary | ICD-10-CM | POA: Diagnosis not present

## 2015-11-08 DIAGNOSIS — N390 Urinary tract infection, site not specified: Secondary | ICD-10-CM | POA: Diagnosis not present

## 2015-11-08 DIAGNOSIS — Z87440 Personal history of urinary (tract) infections: Secondary | ICD-10-CM | POA: Diagnosis not present

## 2015-11-08 DIAGNOSIS — B961 Klebsiella pneumoniae [K. pneumoniae] as the cause of diseases classified elsewhere: Secondary | ICD-10-CM | POA: Diagnosis not present

## 2015-11-08 DIAGNOSIS — Z0000 Encounter for general adult medical examination without abnormal findings: Secondary | ICD-10-CM | POA: Diagnosis not present

## 2015-11-08 DIAGNOSIS — R300 Dysuria: Secondary | ICD-10-CM | POA: Diagnosis not present

## 2015-11-13 DIAGNOSIS — E559 Vitamin D deficiency, unspecified: Secondary | ICD-10-CM | POA: Diagnosis not present

## 2015-11-13 DIAGNOSIS — M166 Other bilateral secondary osteoarthritis of hip: Secondary | ICD-10-CM | POA: Diagnosis not present

## 2015-11-13 DIAGNOSIS — M19241 Secondary osteoarthritis, right hand: Secondary | ICD-10-CM | POA: Diagnosis not present

## 2015-11-13 DIAGNOSIS — M174 Other bilateral secondary osteoarthritis of knee: Secondary | ICD-10-CM | POA: Diagnosis not present

## 2015-12-18 ENCOUNTER — Other Ambulatory Visit: Payer: Self-pay | Admitting: Nurse Practitioner

## 2015-12-18 DIAGNOSIS — M160 Bilateral primary osteoarthritis of hip: Secondary | ICD-10-CM

## 2015-12-18 MED ORDER — TRAMADOL HCL 50 MG PO TABS: 50.0000 mg | ORAL_TABLET | Freq: Three times a day (TID) | ORAL | Status: DC

## 2015-12-18 NOTE — Telephone Encounter (Signed)
Script for tramadol called in. 

## 2015-12-18 NOTE — Telephone Encounter (Signed)
Last filled 10/24/15. Last seen 08/17/15. MMM pt. Route to pool, [facility] in at [facility]

## 2015-12-25 DIAGNOSIS — N200 Calculus of kidney: Secondary | ICD-10-CM | POA: Diagnosis not present

## 2015-12-25 DIAGNOSIS — N3020 Other chronic cystitis without hematuria: Secondary | ICD-10-CM | POA: Diagnosis not present

## 2015-12-25 DIAGNOSIS — N952 Postmenopausal atrophic vaginitis: Secondary | ICD-10-CM | POA: Diagnosis not present

## 2015-12-28 ENCOUNTER — Ambulatory Visit (INDEPENDENT_AMBULATORY_CARE_PROVIDER_SITE_OTHER): Payer: Commercial Managed Care - HMO | Admitting: Family

## 2015-12-28 ENCOUNTER — Encounter: Payer: Self-pay | Admitting: Family

## 2015-12-28 VITALS — BP 131/61 | HR 85 | Temp 97.4°F | Ht 70.0 in | Wt 222.4 lb

## 2015-12-28 DIAGNOSIS — Z780 Asymptomatic menopausal state: Secondary | ICD-10-CM

## 2015-12-28 DIAGNOSIS — E785 Hyperlipidemia, unspecified: Secondary | ICD-10-CM | POA: Diagnosis not present

## 2015-12-28 DIAGNOSIS — G8929 Other chronic pain: Secondary | ICD-10-CM

## 2015-12-28 DIAGNOSIS — E8881 Metabolic syndrome: Secondary | ICD-10-CM | POA: Diagnosis not present

## 2015-12-28 DIAGNOSIS — K219 Gastro-esophageal reflux disease without esophagitis: Secondary | ICD-10-CM | POA: Diagnosis not present

## 2015-12-28 DIAGNOSIS — Z6831 Body mass index (BMI) 31.0-31.9, adult: Secondary | ICD-10-CM | POA: Diagnosis not present

## 2015-12-28 DIAGNOSIS — E876 Hypokalemia: Secondary | ICD-10-CM

## 2015-12-28 DIAGNOSIS — F329 Major depressive disorder, single episode, unspecified: Secondary | ICD-10-CM

## 2015-12-28 DIAGNOSIS — F32A Depression, unspecified: Secondary | ICD-10-CM

## 2015-12-28 DIAGNOSIS — I10 Essential (primary) hypertension: Secondary | ICD-10-CM | POA: Diagnosis not present

## 2015-12-28 DIAGNOSIS — M1990 Unspecified osteoarthritis, unspecified site: Secondary | ICD-10-CM | POA: Diagnosis not present

## 2015-12-28 DIAGNOSIS — E663 Overweight: Secondary | ICD-10-CM | POA: Insufficient documentation

## 2015-12-28 DIAGNOSIS — M160 Bilateral primary osteoarthritis of hip: Secondary | ICD-10-CM | POA: Diagnosis not present

## 2015-12-28 DIAGNOSIS — E669 Obesity, unspecified: Secondary | ICD-10-CM

## 2015-12-28 DIAGNOSIS — F411 Generalized anxiety disorder: Secondary | ICD-10-CM

## 2015-12-28 MED ORDER — ESCITALOPRAM OXALATE 10 MG PO TABS: 10.0000 mg | ORAL_TABLET | Freq: Every day | ORAL | Status: DC

## 2015-12-28 MED ORDER — TRAMADOL HCL 50 MG PO TABS: 50.0000 mg | ORAL_TABLET | Freq: Three times a day (TID) | ORAL | Status: DC

## 2015-12-28 MED ORDER — MELOXICAM 7.5 MG PO TABS
7.5000 mg | ORAL_TABLET | Freq: Every day | ORAL | Status: DC
Start: 2015-12-28 — End: 2016-01-25

## 2015-12-28 NOTE — Patient Instructions (Signed)
Health Maintenance, Female Adopting a healthy lifestyle and getting preventive care can go a long way to promote health and wellness. Talk with your health care provider about what schedule of regular examinations is right for you. This is a good chance for you to check in with your provider about disease prevention and staying healthy. In between checkups, there are plenty of things you can do on your own. Experts have done a lot of research about which lifestyle changes and preventive measures are most likely to keep you healthy. Ask your health care provider for more information. WEIGHT AND DIET  Eat a healthy diet  Be sure to include plenty of vegetables, fruits, low-fat dairy products, and lean protein.  Do not eat a lot of foods high in solid fats, added sugars, or salt.  Get regular exercise. This is one of the most important things you can do for your health.  Most adults should exercise for at least 150 minutes each week. The exercise should increase your heart rate and make you sweat (moderate-intensity exercise).  Most adults should also do strengthening exercises at least twice a week. This is in addition to the moderate-intensity exercise.  Maintain a healthy weight  Body mass index (BMI) is a measurement that can be used to identify possible weight problems. It estimates body fat based on height and weight. Your health care provider can help determine your BMI and help you achieve or maintain a healthy weight.  For females 20 years of age and older:   A BMI below 18.5 is considered underweight.  A BMI of 18.5 to 24.9 is normal.  A BMI of 25 to 29.9 is considered overweight.  A BMI of 30 and above is considered obese.  Watch levels of cholesterol and blood lipids  You should start having your blood tested for lipids and cholesterol at 78 years of age, then have this test every 5 years.  You may need to have your cholesterol levels checked more often if:  Your lipid  or cholesterol levels are high.  You are older than 78 years of age.  You are at high risk for heart disease.  CANCER SCREENING   Lung Cancer  Lung cancer screening is recommended for adults 55-80 years old who are at high risk for lung cancer because of a history of smoking.  A yearly low-dose CT scan of the lungs is recommended for people who:  Currently smoke.  Have quit within the past 15 years.  Have at least a 30-pack-year history of smoking. A pack year is smoking an average of one pack of cigarettes a day for 1 year.  Yearly screening should continue until it has been 15 years since you quit.  Yearly screening should stop if you develop a health problem that would prevent you from having lung cancer treatment.  Breast Cancer  Practice breast self-awareness. This means understanding how your breasts normally appear and feel.  It also means doing regular breast self-exams. Let your health care provider know about any changes, no matter how small.  If you are in your 20s or 30s, you should have a clinical breast exam (CBE) by a health care provider every 1-3 years as part of a regular health exam.  If you are 40 or older, have a CBE every year. Also consider having a breast X-ray (mammogram) every year.  If you have a family history of breast cancer, talk to your health care provider about genetic screening.  If you   are at high risk for breast cancer, talk to your health care provider about having an MRI and a mammogram every year.  Breast cancer gene (BRCA) assessment is recommended for women who have family members with BRCA-related cancers. BRCA-related cancers include:  Breast.  Ovarian.  Tubal.  Peritoneal cancers.  Results of the assessment will determine the need for genetic counseling and BRCA1 and BRCA2 testing. Cervical Cancer Your health care provider may recommend that you be screened regularly for cancer of the pelvic organs (ovaries, uterus, and  vagina). This screening involves a pelvic examination, including checking for microscopic changes to the surface of your cervix (Pap test). You may be encouraged to have this screening done every 3 years, beginning at age 21.  For women ages 30-65, health care providers may recommend pelvic exams and Pap testing every 3 years, or they may recommend the Pap and pelvic exam, combined with testing for human papilloma virus (HPV), every 5 years. Some types of HPV increase your risk of cervical cancer. Testing for HPV may also be done on women of any age with unclear Pap test results.  Other health care providers may not recommend any screening for nonpregnant women who are considered low risk for pelvic cancer and who do not have symptoms. Ask your health care provider if a screening pelvic exam is right for you.  If you have had past treatment for cervical cancer or a condition that could lead to cancer, you need Pap tests and screening for cancer for at least 20 years after your treatment. If Pap tests have been discontinued, your risk factors (such as having a new sexual partner) need to be reassessed to determine if screening should resume. Some women have medical problems that increase the chance of getting cervical cancer. In these cases, your health care provider may recommend more frequent screening and Pap tests. Colorectal Cancer  This type of cancer can be detected and often prevented.  Routine colorectal cancer screening usually begins at 78 years of age and continues through 78 years of age.  Your health care provider may recommend screening at an earlier age if you have risk factors for colon cancer.  Your health care provider may also recommend using home test kits to check for hidden blood in the stool.  A small camera at the end of a tube can be used to examine your colon directly (sigmoidoscopy or colonoscopy). This is done to check for the earliest forms of colorectal  cancer.  Routine screening usually begins at age 50.  Direct examination of the colon should be repeated every 5-10 years through 78 years of age. However, you may need to be screened more often if early forms of precancerous polyps or small growths are found. Skin Cancer  Check your skin from head to toe regularly.  Tell your health care provider about any new moles or changes in moles, especially if there is a change in a mole's shape or color.  Also tell your health care provider if you have a mole that is larger than the size of a pencil eraser.  Always use sunscreen. Apply sunscreen liberally and repeatedly throughout the day.  Protect yourself by wearing long sleeves, pants, a wide-brimmed hat, and sunglasses whenever you are outside. HEART DISEASE, DIABETES, AND HIGH BLOOD PRESSURE   High blood pressure causes heart disease and increases the risk of stroke. High blood pressure is more likely to develop in:  People who have blood pressure in the high end   of the normal range (130-139/85-89 mm Hg).  People who are overweight or obese.  People who are African American.  If you are 38-23 years of age, have your blood pressure checked every 3-5 years. If you are 61 years of age or older, have your blood pressure checked every year. You should have your blood pressure measured twice--once when you are at a hospital or clinic, and once when you are not at a hospital or clinic. Record the average of the two measurements. To check your blood pressure when you are not at a hospital or clinic, you can use:  An automated blood pressure machine at a pharmacy.  A home blood pressure monitor.  If you are between 45 years and 39 years old, ask your health care provider if you should take aspirin to prevent strokes.  Have regular diabetes screenings. This involves taking a blood sample to check your fasting blood sugar level.  If you are at a normal weight and have a low risk for diabetes,  have this test once every three years after 78 years of age.  If you are overweight and have a high risk for diabetes, consider being tested at a younger age or more often. PREVENTING INFECTION  Hepatitis B  If you have a higher risk for hepatitis B, you should be screened for this virus. You are considered at high risk for hepatitis B if:  You were born in a country where hepatitis B is common. Ask your health care provider which countries are considered high risk.  Your parents were born in a high-risk country, and you have not been immunized against hepatitis B (hepatitis B vaccine).  You have HIV or AIDS.  You use needles to inject street drugs.  You live with someone who has hepatitis B.  You have had sex with someone who has hepatitis B.  You get hemodialysis treatment.  You take certain medicines for conditions, including cancer, organ transplantation, and autoimmune conditions. Hepatitis C  Blood testing is recommended for:  Everyone born from [birth year] through 1963.  Anyone with known risk factors for hepatitis C. Sexually transmitted infections (STIs)  You should be screened for sexually transmitted infections (STIs) including gonorrhea and chlamydia if:  You are sexually active and are younger than 78 years of age.  You are older than 78 years of age and your health care provider tells you that you are at risk for this type of infection.  Your sexual activity has changed since you were last screened and you are at an increased risk for chlamydia or gonorrhea. Ask your health care provider if you are at risk.  If you do not have HIV, but are at risk, it may be recommended that you take a prescription medicine daily to prevent HIV infection. This is called pre-exposure prophylaxis (PrEP). You are considered at risk if:  You are sexually active and do not regularly use condoms or know the HIV status of your partner(s).  You take drugs by injection.  You are sexually  active with a partner who has HIV. Talk with your health care provider about whether you are at high risk of being infected with HIV. If you choose to begin PrEP, you should first be tested for HIV. You should then be tested every 3 months for as long as you are taking PrEP.  PREGNANCY   If you are premenopausal and you may become pregnant, ask your health care provider about preconception counseling.  If you may  become pregnant, take 400 to 800 micrograms (mcg) of folic acid every day.  If you want to prevent pregnancy, talk to your health care provider about birth control (contraception). OSTEOPOROSIS AND MENOPAUSE   Osteoporosis is a disease in which the bones lose minerals and strength with aging. This can result in serious bone fractures. Your risk for osteoporosis can be identified using a bone density scan.  If you are 61 years of age or older, or if you are at risk for osteoporosis and fractures, ask your health care provider if you should be screened.  Ask your health care provider whether you should take a calcium or vitamin D supplement to lower your risk for osteoporosis.  Menopause may have certain physical symptoms and risks.  Hormone replacement therapy may reduce some of these symptoms and risks. Talk to your health care provider about whether hormone replacement therapy is right for you.  HOME CARE INSTRUCTIONS   Schedule regular health, dental, and eye exams.  Stay current with your immunizations.   Do not use any tobacco products including cigarettes, chewing tobacco, or electronic cigarettes.  If you are pregnant, do not drink alcohol.  If you are breastfeeding, limit how much and how often you drink alcohol.  Limit alcohol intake to no more than 1 drink per day for nonpregnant women. One drink equals 12 ounces of beer, 5 ounces of wine, or 1 ounces of hard liquor.  Do not use street drugs.  Do not share needles.  Ask your health care provider for help if  you need support or information about quitting drugs.  Tell your health care provider if you often feel depressed.  Tell your health care provider if you have ever been abused or do not feel safe at home.   This information is not intended to replace advice given to you by your health care provider. Make sure you discuss any questions you have with your health care provider.   Document Released: 12/31/2010 Document Revised: 07/08/2014 Document Reviewed: 05/19/2013 Elsevier Interactive Patient Education [employer].

## 2015-12-28 NOTE — Addendum Note (Signed)
Addended by: Evelina Dun A on: 12/28/2015 12:16 PM   Modules accepted: Orders

## 2015-12-28 NOTE — Progress Notes (Addendum)
Subjective:    Patient ID: Caitlin Mccarthy, female    DOB: May 25, 1936, 80 y.o.   MRN: [id number]  Patient here today for follow up of chronic medical problems.  Hypertension This is a chronic problem. The current episode started more than 1 year ago. The problem has been resolved since onset. The problem is controlled. Associated symptoms include anxiety and shortness of breath ("at times"). Pertinent negatives include no blurred vision, chest pain, palpitations or peripheral edema. Risk factors for coronary artery disease include dyslipidemia, family history, obesity, smoking/tobacco exposure, post-menopausal state and sedentary lifestyle. Past treatments include calcium channel blockers, ACE inhibitors and diuretics. The current treatment provides moderate improvement. Hypertensive end-organ damage includes CAD/MI. There is no history of kidney disease, CVA, heart failure or a thyroid problem.  Hyperlipidemia This is a chronic problem. The current episode started more than 1 year ago. The problem is uncontrolled. Recent lipid tests were reviewed and are high. Exacerbating diseases include obesity. She has no history of diabetes or hypothyroidism. Associated symptoms include shortness of breath ("at times"). Pertinent negatives include no chest pain or myalgias. Current antihyperlipidemic treatment includes statins. The current treatment provides moderate improvement of lipids. Compliance problems include adherence to diet and adherence to exercise.  Risk factors for coronary artery disease include dyslipidemia, hypertension, obesity, post-menopausal and a sedentary lifestyle.  Gastroesophageal Reflux She complains of belching and heartburn. She reports no chest pain or no nausea. This is a chronic problem. The current episode started more than 1 year ago. The problem occurs occasionally. The problem has been waxing and waning. The heartburn does not wake her from sleep. The symptoms are aggravated by  certain foods and lying down. Risk factors include obesity. She has tried a PPI for the symptoms. The treatment provided mild relief.  Arthritis Presents for follow-up visit. The disease course has been fluctuating. She complains of pain, joint swelling and joint warmth. Affected locations include the right knee and left knee (right hip). Her pain is at a severity of 10/10. Pertinent negatives include no dry mouth, dysuria, pain at night, pain while resting or uveitis. Her past medical history is significant for osteoarthritis. Past treatments include rest and NSAIDs. The treatment provided mild relief.  Anxiety Presents for initial visit. Onset was more than 5 years ago. The problem has been waxing and waning. Symptoms include excessive worry, irritability, nervous/anxious behavior, restlessness and shortness of breath ("at times"). Patient reports no chest pain, dry mouth, nausea or palpitations. Symptoms occur most days. The symptoms are aggravated by family issues. The quality of sleep is good.   Her past medical history is significant for anxiety/panic attacks.  Metabolic syndrome Does not check blood sugars at home. Tries to watch carb intake    Review of Systems  Constitutional: Positive for irritability.  HENT: Negative.   Eyes: Negative.  Negative for blurred vision.  Respiratory: Positive for shortness of breath ("at times").   Cardiovascular: Negative.  Negative for chest pain, palpitations and leg swelling.  Gastrointestinal: Positive for heartburn. Negative for nausea.  Endocrine: Negative for polydipsia, polyphagia and polyuria.  Genitourinary: Negative.  Negative for dysuria.       Denies urinary incontinence  Musculoskeletal: Positive for joint swelling, arthralgias, gait problem and arthritis. Negative for myalgias.  Skin: Negative.   Neurological: Negative.   Psychiatric/Behavioral: The patient is nervous/anxious.   All other systems reviewed and are negative.       Objective:   Physical Exam  Constitutional: She is oriented to person,  place, and time. She appears well-developed and well-nourished.  HENT:  Nose: Nose normal.  Mouth/Throat: Oropharynx is clear and moist.  Eyes: EOM are normal.  Neck: Trachea normal, normal range of motion and full passive range of motion without pain. Neck supple. No JVD present. Carotid bruit is not present. No thyromegaly present.  Cardiovascular: Normal rate, regular rhythm, normal heart sounds and intact distal pulses.  Exam reveals no gallop and no friction rub.   No murmur heard. Pulmonary/Chest: Effort normal and breath sounds normal. No respiratory distress.  Abdominal: Soft. Bowel sounds are normal. She exhibits no distension and no mass. There is no tenderness.  Musculoskeletal: She exhibits edema (trace amt in bilateral knees) and tenderness.  Pt walking with cane and slow movements with gait related to pain  Lymphadenopathy:    She has no cervical adenopathy.  Neurological: She is alert and oriented to person, place, and time. She has normal reflexes.  Skin: Skin is warm and dry.  Psychiatric: She has a normal mood and affect. Her behavior is normal. Judgment and thought content normal.   BP 131/61 mmHg  Pulse 85  Temp(Src) 97.4 F (36.3 C) (Oral)  Ht '5\' 10"'  (1.778 m)  Wt 222 lb 6.4 oz (100.88 kg)  BMI 31.91 kg/m2       Assessment & Plan:  1. Essential hypertension, benign - CMP14+EGFR  2. Gastroesophageal reflux disease without esophagitis - CMP14+EGFR  3. Primary osteoarthritis of both hips -Pt given rx of ultram today and started on mobic today -ROM exercises encouraged - traMADol (ULTRAM) 50 MG tablet; Take 1-2 tablets (50-100 mg total) by mouth 3 (three) times daily.  Dispense: 120 tablet; Refill: 2 - meloxicam (MOBIC) 7.5 MG tablet; Take 1 tablet (7.5 mg total) by mouth daily.  Dispense: 30 tablet; Refill: 0 - CMP14+EGFR  4. Arthritis - traMADol (ULTRAM) 50 MG tablet; Take 1-2  tablets (50-100 mg total) by mouth 3 (three) times daily.  Dispense: 120 tablet; Refill: 2 - meloxicam (MOBIC) 7.5 MG tablet; Take 1 tablet (7.5 mg total) by mouth daily.  Dispense: 30 tablet; Refill: 0 - CMP14+EGFR  5. Hyperlipidemia with target LDL less than 100 - CMP14+EGFR - Lipid panel  6. Depression - JIZ12+OFVW  7. Metabolic syndrome - AQL73+PVGK  8. BMI 31.0-31.9,adult - CMP14+EGFR  9. Chronic pain - CMP14+EGFR  10. Hypokalemia - CMP14+EGFR  11. Obesity (BMI 30-39.9) - CMP14+EGFR  12. Post-menopausal - DG [facility] DEXA  13. GAD (generalized anxiety disorder) -Pt started on lexapro 10 mg today -Stress management  RTO in 4 weeks - escitalopram (LEXAPRO) 10 MG tablet; Take 1 tablet (10 mg total) by mouth daily.  Dispense: 90 tablet; Refill: 0  Continue all meds Labs pending Health Maintenance reviewed Diet and exercise encouraged RTO 4 weeks to recheck GAD  Evelina Dun, FNP

## 2015-12-29 LAB — CMP14+EGFR
ALK PHOS: 58 IU/L (ref 39–117)
ALT: 26 IU/L (ref 0–32)
AST: 27 IU/L (ref 0–40)
Albumin/Globulin Ratio: 1.4 (ref 1.2–2.2)
Albumin: 4.5 g/dL (ref 3.5–4.8)
BUN/Creatinine Ratio: 24 (ref 12–28)
BUN: 21 mg/dL (ref 8–27)
Bilirubin Total: 0.3 mg/dL (ref 0.0–1.2)
CALCIUM: 11.5 mg/dL — AB (ref 8.7–10.3)
CO2: 23 mmol/L (ref 18–29)
CREATININE: 0.87 mg/dL (ref 0.57–1.00)
Chloride: 99 mmol/L (ref 96–106)
GFR calc Af Amer: 74 mL/min/{1.73_m2} (ref >59)
GFR, EST NON AFRICAN AMERICAN: 64 mL/min/{1.73_m2} (ref >59)
GLUCOSE: 194 mg/dL — AB (ref 65–99)
Globulin, Total: 3.2 g/dL (ref 1.5–4.5)
Potassium: 4.1 mmol/L (ref 3.5–5.2)
SODIUM: 137 mmol/L (ref 134–144)
Total Protein: 7.7 g/dL (ref 6.0–8.5)

## 2015-12-29 LAB — LIPID PANEL
CHOL/HDL RATIO: 6.8 ratio — AB (ref 0.0–4.4)
CHOLESTEROL TOTAL: 250 mg/dL — AB (ref 100–199)
HDL: 37 mg/dL — AB (ref >39)
TRIGLYCERIDES: 439 mg/dL — AB (ref 0–149)

## 2016-01-01 ENCOUNTER — Other Ambulatory Visit: Payer: Self-pay | Admitting: Family

## 2016-01-01 MED ORDER — FENOFIBRATE 145 MG PO TABS: 145.0000 mg | ORAL_TABLET | Freq: Every day | ORAL | Status: DC

## 2016-01-17 DIAGNOSIS — L570 Actinic keratosis: Secondary | ICD-10-CM | POA: Diagnosis not present

## 2016-01-17 DIAGNOSIS — Z85828 Personal history of other malignant neoplasm of skin: Secondary | ICD-10-CM | POA: Diagnosis not present

## 2016-01-25 ENCOUNTER — Other Ambulatory Visit: Payer: Self-pay | Admitting: Family

## 2016-01-25 DIAGNOSIS — M160 Bilateral primary osteoarthritis of hip: Secondary | ICD-10-CM

## 2016-01-25 DIAGNOSIS — M1990 Unspecified osteoarthritis, unspecified site: Secondary | ICD-10-CM

## 2016-01-26 DIAGNOSIS — H5210 Myopia, unspecified eye: Secondary | ICD-10-CM | POA: Diagnosis not present

## 2016-01-26 DIAGNOSIS — H524 Presbyopia: Secondary | ICD-10-CM | POA: Diagnosis not present

## 2016-01-29 DIAGNOSIS — B961 Klebsiella pneumoniae [K. pneumoniae] as the cause of diseases classified elsewhere: Secondary | ICD-10-CM | POA: Diagnosis not present

## 2016-01-29 DIAGNOSIS — N390 Urinary tract infection, site not specified: Secondary | ICD-10-CM | POA: Diagnosis not present

## 2016-01-29 DIAGNOSIS — N3020 Other chronic cystitis without hematuria: Secondary | ICD-10-CM | POA: Diagnosis not present

## 2016-01-31 ENCOUNTER — Ambulatory Visit (INDEPENDENT_AMBULATORY_CARE_PROVIDER_SITE_OTHER): Payer: Commercial Managed Care - HMO | Admitting: Family

## 2016-01-31 ENCOUNTER — Ambulatory Visit (INDEPENDENT_AMBULATORY_CARE_PROVIDER_SITE_OTHER): Payer: Commercial Managed Care - HMO

## 2016-01-31 ENCOUNTER — Encounter: Payer: Self-pay | Admitting: Family

## 2016-01-31 VITALS — BP 135/68 | HR 73 | Temp 97.2°F | Ht 70.0 in | Wt 216.0 lb

## 2016-01-31 DIAGNOSIS — R739 Hyperglycemia, unspecified: Secondary | ICD-10-CM

## 2016-01-31 DIAGNOSIS — F32A Depression, unspecified: Secondary | ICD-10-CM

## 2016-01-31 DIAGNOSIS — F329 Major depressive disorder, single episode, unspecified: Secondary | ICD-10-CM

## 2016-01-31 DIAGNOSIS — Z780 Asymptomatic menopausal state: Secondary | ICD-10-CM | POA: Diagnosis not present

## 2016-01-31 DIAGNOSIS — E1165 Type 2 diabetes mellitus with hyperglycemia: Secondary | ICD-10-CM

## 2016-01-31 DIAGNOSIS — M1990 Unspecified osteoarthritis, unspecified site: Secondary | ICD-10-CM | POA: Diagnosis not present

## 2016-01-31 LAB — BAYER DCA HB A1C WAIVED: HB A1C (BAYER DCA - WAIVED): 8.6 % — ABNORMAL HIGH (ref <7.0)

## 2016-01-31 MED ORDER — METFORMIN HCL 500 MG PO TABS: 500.0000 mg | ORAL_TABLET | Freq: Two times a day (BID) | ORAL | 3 refills | Status: DC

## 2016-01-31 NOTE — Progress Notes (Signed)
Subjective:    Patient ID: Caitlin Mccarthy, female    DOB: 1936/06/17, 80 y.o.   MRN: [id number]  PT presents to the office today to recheck depression. PT was started lexapro 10 mg 4 weeks ago. PT states this has helped and now she only "feels down every once in awhile". PT was also started on mobic for arthritis and states this seems to be helping. Pt's blood glucose was elevated last blood draw.  Depression         This is a chronic problem.  The current episode started more than 1 year ago.   The onset quality is gradual.   The problem occurs intermittently.  The problem has been gradually improving since onset.  Associated symptoms include body aches and sad (sometimes).  Associated symptoms include no fatigue, no helplessness, no hopelessness, not irritable, no restlessness and no suicidal ideas.     The symptoms are aggravated by social issues.  Past treatments include SSRIs - Selective serotonin reuptake inhibitors.  Compliance with treatment is good.  Previous treatment provided mild relief. Arthritis  Presents for follow-up visit. She complains of pain, stiffness and joint swelling. The symptoms have been improving. Affected locations include the right knee, left knee and right hip. Her pain is at a severity of 9/10. Pertinent negatives include no dry eyes, dry mouth, fatigue or pain at night.  Diabetes  She presents for her follow-up diabetic visit. She has type 2 diabetes mellitus. Her disease course has been worsening. Pertinent negatives for diabetes include no fatigue. When asked about current treatments, none were reported. Her breakfast blood glucose range is generally 180-200 mg/dl.      Review of Systems  Constitutional: Negative for fatigue.  Musculoskeletal: Positive for arthritis, joint swelling and stiffness.  Psychiatric/Behavioral: Positive for depression. Negative for suicidal ideas.       Objective:   Physical Exam  Constitutional: She is oriented to person,  place, and time. She appears well-developed and well-nourished. She is not irritable. No distress.  HENT:  Head: Normocephalic.  Eyes: Pupils are equal, round, and reactive to light.  Neck: Normal range of motion. Neck supple. No thyromegaly present.  Cardiovascular: Normal rate, regular rhythm, normal heart sounds and intact distal pulses.   No murmur heard. Pulmonary/Chest: Effort normal and breath sounds normal. No respiratory distress. She has no wheezes.  Abdominal: Soft. Bowel sounds are normal. She exhibits no distension. There is no tenderness.  Musculoskeletal: She exhibits edema (trace in BLE). She exhibits no tenderness.  Pt walking with cane   Neurological: She is alert and oriented to person, place, and time. She has normal reflexes. No cranial nerve deficit.  Skin: Skin is warm and dry.  Psychiatric: She has a normal mood and affect. Her behavior is normal. Judgment and thought content normal.  Vitals reviewed.   BP 135/68   Pulse 73   Temp 97.2 F (36.2 C) (Oral)   Ht '5\' 10"'  (1.778 m)   Wt 216 lb (98 kg)   BMI 30.99 kg/m        Assessment & Plan:  1. Depression -Continue lexapro 10 mg  Stress management discussed - CMP14+EGFR  2. Blood glucose elevated - Bayer DCA Hb A1c Waived - CMP14+EGFR  3. Type 2 diabetes mellitus with hyperglycemia, without long-term current use of insulin (HCC) -Low carb diet -Information given on DM -PT to get appt with clinical pharmacists asap as she is a new diabetitc - Bayer DCA Hb A1c Waived -  CMP14+EGFR - metFORMIN (GLUCOPHAGE) 500 MG tablet; Take 1 tablet (500 mg total) by mouth 2 (two) times daily with a meal.  Dispense: 180 tablet; Refill: 3  4. Post-menopause - DG [facility] DEXA  5. Arthritis -Continue mobic, but told pt she needs to start taking with food!!  Continue all meds Labs pending Health Maintenance reviewed Diet and exercise encouraged RTO 2 months  Evelina Dun, FNP

## 2016-01-31 NOTE — Patient Instructions (Signed)

## 2016-02-01 LAB — CMP14+EGFR
ALBUMIN: 4.7 g/dL (ref 3.5–4.8)
ALK PHOS: 49 IU/L (ref 39–117)
ALT: 29 IU/L (ref 0–32)
AST: 37 IU/L (ref 0–40)
Albumin/Globulin Ratio: 1.4 (ref 1.2–2.2)
BUN/Creatinine Ratio: 19 (ref 12–28)
BUN: 21 mg/dL (ref 8–27)
Bilirubin Total: 0.4 mg/dL (ref 0.0–1.2)
CALCIUM: 11.9 mg/dL — AB (ref 8.7–10.3)
CO2: 25 mmol/L (ref 18–29)
Chloride: 94 mmol/L — ABNORMAL LOW (ref 96–106)
Creatinine, Ser: 1.09 mg/dL — ABNORMAL HIGH (ref 0.57–1.00)
GFR calc Af Amer: 57 mL/min/{1.73_m2} — ABNORMAL LOW (ref >59)
GFR, EST NON AFRICAN AMERICAN: 49 mL/min/{1.73_m2} — AB (ref >59)
GLUCOSE: 172 mg/dL — AB (ref 65–99)
Globulin, Total: 3.3 g/dL (ref 1.5–4.5)
Potassium: 4.9 mmol/L (ref 3.5–5.2)
Sodium: 134 mmol/L (ref 134–144)
Total Protein: 8 g/dL (ref 6.0–8.5)

## 2016-02-05 ENCOUNTER — Encounter: Payer: Self-pay | Admitting: Pharmacist

## 2016-02-05 ENCOUNTER — Ambulatory Visit (INDEPENDENT_AMBULATORY_CARE_PROVIDER_SITE_OTHER): Payer: Commercial Managed Care - HMO | Admitting: Pharmacist

## 2016-02-05 DIAGNOSIS — E1165 Type 2 diabetes mellitus with hyperglycemia: Secondary | ICD-10-CM | POA: Diagnosis not present

## 2016-02-05 DIAGNOSIS — E119 Type 2 diabetes mellitus without complications: Secondary | ICD-10-CM | POA: Insufficient documentation

## 2016-02-05 MED ORDER — BLOOD GLUCOSE MONITOR KIT: PACK | 0 refills | Status: DC

## 2016-02-05 NOTE — Patient Instructions (Signed)
Diabetes and Standards of Medical Care   Diabetes is complicated. You may find that your diabetes team includes a dietitian, nurse, diabetes educator, eye doctor, and more. To help everyone know what is going on and to help you get the care you deserve, the following schedule of care was developed to help keep you on track. Below are the tests, exams, vaccines, medicines, education, and plans you will need.  Blood Glucose Goals Prior to meals = 70 - 130 Within 2 hours of the start of a meal = less than 180  HbA1c test (goal is less than 7.0% - your last value was 8.6%) This test shows how well you have controlled your glucose over the past 2 to 3 months. It is used to see if your diabetes management plan needs to be adjusted.   It is performed at least 2 times a year if you are meeting treatment goals.  It is performed 4 times a year if therapy has changed or if you are not meeting treatment goals.  Blood pressure test  This test is performed at every routine medical visit. The goal is less than 140/90 mmHg for most people, but 130/80 mmHg in some cases. Ask your health care provider about your goal.  Dental exam  Follow up with the dentist regularly.  Eye exam  If you are diagnosed with type 1 diabetes as a child, get an exam upon reaching the age of 11 years or older and have had diabetes for 3 to 5 years. Yearly eye exams are recommended after that initial eye exam.  If you are diagnosed with type 1 diabetes as an adult, get an exam within 5 years of diagnosis and then yearly.  If you are diagnosed with type 2 diabetes, get an exam as soon as possible after the diagnosis and then yearly.  Foot care exam  Visual foot exams are performed at every routine medical visit. The exams check for cuts, injuries, or other problems with the feet.  A comprehensive foot exam should be done yearly. This includes visual inspection as well as assessing foot pulses and testing for loss of  sensation.  Check your feet nightly for cuts, injuries, or other problems with your feet. Tell your health care provider if anything is not healing.  Kidney function test (urine microalbumin)  This test is performed once a year.  Type 1 diabetes: The first test is performed 5 years after diagnosis.  Type 2 diabetes: The first test is performed at the time of diagnosis.  A serum creatinine and estimated glomerular filtration rate (eGFR) test is done once a year to assess the level of chronic kidney disease (CKD), if present.  Lipid profile (cholesterol, HDL, LDL, triglycerides)  Performed every 5 years for most people.  The goal for LDL is less than 100 mg/dL. If you are at high risk, the goal is less than 70 mg/dL.  The goal for HDL is 40 mg/dL to 50 mg/dL for men and 50 mg/dL to 60 mg/dL for women. An HDL cholesterol of 60 mg/dL or higher gives some protection against heart disease.  The goal for triglycerides is less than 150 mg/dL.  Influenza vaccine, pneumococcal vaccine, and hepatitis B vaccine  The influenza vaccine is recommended yearly.  The pneumococcal vaccine is generally given once in a lifetime. However, there are some instances when another vaccination is recommended. Check with your health care provider.  The hepatitis B vaccine is also recommended for adults with diabetes.  Diabetes self-management education  Education is recommended at diagnosis and ongoing as needed.  Treatment plan  Your treatment plan is reviewed at every medical visit.  Document Released: 04/14/2009 Document Revised: 02/17/2013 Document Reviewed: 11/17/2012 Exit[employer] Patient Information 2014 [employer].   

## 2016-02-05 NOTE — Progress Notes (Signed)
Patient ID: Caitlin Mccarthy, female   DOB: 09-29-1935, 80 y.o.   MRN: [id number]  Subjective:    Caitlin Mccarthy is a 80 y.o. female who presents for an initial evaluation of Type 2 diabetes mellitus.  Patient has just been diagnosed with T2DM 01/31/2016. She was started on metformin 500mg  bid.  She is tolerating well.  Also of note patient's triglycerides were 439 12/28/2015 - she is taking rosuvastatin 20mg  qd and fenofibrate 145mg  daily   Known diabetic complications: none Cardiovascular risk factors: advanced age (older than 46 for men, 70 for women), diabetes mellitus, dyslipidemia and obesity (BMI >= 30 kg/m2)  Eye exam current (within one year): yes - patient recently saw optometrist in [place] who was suspicious she had DM and referred her to PCP for check. Weight trend: stable Prior visit with dietician: no Current diet: in general, an "unhealthy" diet, drinks regular soda and loves breads Current exercise: none  Current monitoring regimen: none Home blood sugar records: n/a Any episodes of hypoglycemia? no  Is She on ACE inhibitor or angiotensin II receptor blocker?  Yes  amlodipine / benazepril combo    The following portions of the patient's history were reviewed and updated as appropriate: allergies, current medications, past family history, past medical history, past social history, past surgical history and problem list.   Objective:    BP 134/64   Pulse 68   Ht 5\' 10"  (1.778 m)   Wt 222 lb (100.7 kg)   BMI 31.85 kg/m   A1c = 8.6% 01/31/2016  Lab Review Glucose  Date Value  01/31/2016 172 mg/dL (H)  12/28/2015 194 mg/dL (H)  08/15/2015 128 mg/dl  06/02/2015 133 mg/dL (H)   Glucose, Bld (mg/dL)  Date Value  04/27/2015 133 (H)  04/17/2015 131 (H)  04/15/2015 147 (H)   Chloride (mEq/L)  Date Value  08/15/2015 101   CO2  Date Value  01/31/2016 25 mmol/L  12/28/2015 23 mmol/L  08/15/2015 26 mEq/L  06/02/2015 26 mmol/L   BUN (mg/dL)  Date Value   01/31/2016 21  12/28/2015 21  08/15/2015 18.8  06/02/2015 21   Creatinine (mg/dL)  Date Value  08/15/2015 0.9   Creat (mg/dL)  Date Value  10/02/2012 0.65   Creatinine, Ser (mg/dL)  Date Value  01/31/2016 1.09 (H)  12/28/2015 0.87  06/02/2015 0.72   Lipid Panel     Component Value Date/Time   CHOL 250 (H) 12/28/2015 1222   CHOL 158 10/02/2012 1301   TRIG 439 (H) 12/28/2015 1222   TRIG 229 (H) 11/24/2014 1126   TRIG 126 10/02/2012 1301   HDL 37 (L) 12/28/2015 1222   HDL 42 11/24/2014 1126   HDL 41 10/02/2012 1301   CHOLHDL 6.8 (H) 12/28/2015 1222   LDLCALC Comment 12/28/2015 1222   LDLCALC 100 (H) 11/29/2013 1223   LDLCALC 92 10/02/2012 1301    Assessment:    Diabetes Mellitus type II, under inadequate control.    Plan:    1.  Rx changes: none - continue metformin 500mg  qd 2.  Education: Reviewed 'ABCs' of diabetes management (respective goals in parentheses):  A1C (<7), blood pressure (<130/80), and cholesterol (LDL <100). 3. Discussed pathophysiology of DM; difference between type 1 and type 2 DM. 4. CHO counting diet discussed.  Reviewed CHO amount in various foods and how to read nutrition labels.  Discussed recommended serving sizes.  5.  Recommended increase physical activity - goal is 150 minutes per week 6.  Patient is taught to use  glucometer and taught BG goals.  She is to check BG 1-2 times per day.  Rx given for glucometer, test strips and lancet of patient or insurance choice.  7. Follow up: 2 months

## 2016-02-12 ENCOUNTER — Other Ambulatory Visit: Payer: Commercial Managed Care - HMO

## 2016-02-21 ENCOUNTER — Ambulatory Visit: Payer: Commercial Managed Care - HMO | Admitting: Oncology

## 2016-03-07 ENCOUNTER — Telehealth: Payer: Self-pay | Admitting: Nurse Practitioner

## 2016-03-07 DIAGNOSIS — L989 Disorder of the skin and subcutaneous tissue, unspecified: Secondary | ICD-10-CM

## 2016-03-07 NOTE — Telephone Encounter (Signed)
Caitlin Mccarthy seen the last 2 OV - can you place a referral for DERM?

## 2016-03-12 DIAGNOSIS — C44729 Squamous cell carcinoma of skin of left lower limb, including hip: Secondary | ICD-10-CM | POA: Diagnosis not present

## 2016-03-12 DIAGNOSIS — D485 Neoplasm of uncertain behavior of skin: Secondary | ICD-10-CM | POA: Diagnosis not present

## 2016-03-14 DIAGNOSIS — R310 Gross hematuria: Secondary | ICD-10-CM | POA: Diagnosis not present

## 2016-03-14 DIAGNOSIS — N200 Calculus of kidney: Secondary | ICD-10-CM | POA: Diagnosis not present

## 2016-03-15 DIAGNOSIS — N200 Calculus of kidney: Secondary | ICD-10-CM | POA: Diagnosis not present

## 2016-03-15 DIAGNOSIS — R310 Gross hematuria: Secondary | ICD-10-CM | POA: Diagnosis not present

## 2016-03-26 DIAGNOSIS — N3020 Other chronic cystitis without hematuria: Secondary | ICD-10-CM | POA: Diagnosis not present

## 2016-03-26 DIAGNOSIS — N200 Calculus of kidney: Secondary | ICD-10-CM | POA: Diagnosis not present

## 2016-03-27 ENCOUNTER — Other Ambulatory Visit: Payer: Self-pay | Admitting: Nurse Practitioner

## 2016-03-27 DIAGNOSIS — I10 Essential (primary) hypertension: Secondary | ICD-10-CM

## 2016-03-29 ENCOUNTER — Encounter (HOSPITAL_COMMUNITY): Payer: Self-pay | Admitting: General Practice

## 2016-03-29 ENCOUNTER — Other Ambulatory Visit: Payer: Self-pay | Admitting: Urology

## 2016-04-01 ENCOUNTER — Ambulatory Visit (HOSPITAL_COMMUNITY): Payer: Commercial Managed Care - HMO

## 2016-04-01 ENCOUNTER — Ambulatory Visit (HOSPITAL_COMMUNITY)
Admission: RE | Admit: 2016-04-01 | Discharge: 2016-04-01 | Disposition: A | Discharge status: Home / Self Care | Payer: Commercial Managed Care - HMO | Source: Ambulatory Visit | Attending: Urology | Admitting: Urology

## 2016-04-01 ENCOUNTER — Encounter (HOSPITAL_COMMUNITY)
Admission: RE | Disposition: A | Discharge status: Home / Self Care | Payer: Self-pay | Source: Ambulatory Visit | Attending: Urology

## 2016-04-01 DIAGNOSIS — N200 Calculus of kidney: Secondary | ICD-10-CM | POA: Insufficient documentation

## 2016-04-01 DIAGNOSIS — E119 Type 2 diabetes mellitus without complications: Secondary | ICD-10-CM | POA: Insufficient documentation

## 2016-04-01 DIAGNOSIS — Z79899 Other long term (current) drug therapy: Secondary | ICD-10-CM | POA: Insufficient documentation

## 2016-04-01 DIAGNOSIS — F17200 Nicotine dependence, unspecified, uncomplicated: Secondary | ICD-10-CM | POA: Insufficient documentation

## 2016-04-01 DIAGNOSIS — I10 Essential (primary) hypertension: Secondary | ICD-10-CM | POA: Diagnosis not present

## 2016-04-01 LAB — GLUCOSE, CAPILLARY: Glucose-Capillary: 164 mg/dL — ABNORMAL HIGH (ref 65–99)

## 2016-04-01 SURGERY — LITHOTRIPSY, ESWL
Anesthesia: LOCAL | Laterality: Left

## 2016-04-01 MED ORDER — DIPHENHYDRAMINE HCL 25 MG PO CAPS
25.0000 mg | ORAL_CAPSULE | ORAL | Status: AC
  Administered 2016-04-01: 25 mg via ORAL
  Filled 2016-04-01: qty 1

## 2016-04-01 MED ORDER — SODIUM CHLORIDE 0.9 % IV SOLN
INTRAVENOUS | Status: DC
  Administered 2016-04-01: 08:00:00 via INTRAVENOUS

## 2016-04-01 MED ORDER — CIPROFLOXACIN HCL 500 MG PO TABS
500.0000 mg | ORAL_TABLET | ORAL | Status: AC
  Administered 2016-04-01: 500 mg via ORAL
  Filled 2016-04-01: qty 1

## 2016-04-01 MED ORDER — DIAZEPAM 5 MG PO TABS
10.0000 mg | ORAL_TABLET | ORAL | Status: AC
  Administered 2016-04-01: 10 mg via ORAL
  Filled 2016-04-01: qty 2

## 2016-04-01 MED ORDER — HYDROCODONE-ACETAMINOPHEN 5-325 MG PO TABS: 1.0000 | ORAL_TABLET | Freq: Four times a day (QID) | ORAL | 0 refills | Status: DC | PRN

## 2016-04-01 NOTE — Interval H&P Note (Signed)
History and Physical Interval Note:  04/01/2016 9:32 AM  Caitlin Mccarthy  has presented today for surgery, with the diagnosis of left renal stone  The various methods of treatment have been discussed with the patient and family. After consideration of risks, benefits and other options for treatment, the patient has consented to  Procedure(s): LEFT EXTRACORPOREAL SHOCK WAVE LITHOTRIPSY (ESWL) (Left) as a surgical intervention .  The patient's history has been reviewed, patient examined, no change in status, stable for surgery.  I have reviewed the patient's chart and labs.  Questions were answered to the patient's satisfaction.     Crissy Mccreadie S

## 2016-04-01 NOTE — H&P (Signed)
Office Visit Report     03/26/2016   --------------------------------------------------------------------------------   Caitlin Mccarthy  [id number]  PRIMARY CARE:  Chipper Herb, MD  DOB: 1936/02/15, 80 year old Female  REFERRING:  Irine Seal, MD  SSN: -**[id number]  PROVIDER:  Irine Seal, M.D.    TREATING:  Jiles Crocker    LOCATION:  [facility] [phone]   --------------------------------------------------------------------------------   CC/HPI: Pt has a known kidney stone burden thought to be possibly harboring an infectious organism. Last urine c/s was positive. She is currently taking Keflex which was sensitive to the last organism that grew on urine c/s.   She is doing much better since last OV with no renal colic, gross hematuria, fevers, or dysuria.     ALLERGIES: No Allergies    MEDICATIONS: Keflex 500 mg capsule 1 capsule PO TID  Aspirin 81 MG TABS Oral  Lotrel 10-40 MG Oral Capsule Oral  Premarin 0.625 mg/gram cream with applicator 0.5 gram Topical Q HS Use twice weekly  Rosuvastatin Calcium 20 MG Oral Tablet Oral  Trimethoprim 100 mg tablet 1 tablet PO Q HS     Notes: called keflex 500 mg 1 tid #30 to pharmacy per DR. Wrenn   GU PSH: Cysto Uretero Lithotripsy - 04/28/2015 Remove Renal Tube W/fluoro - 04/28/2015      PSH Notes: Cystoscopy With Ureteroscopy With Lithotripsy, Fluoroscopic Guidance For Nephrostomy Tube Removal, Hip Surgery, Knee Surgery   NON-GU PSH: None   GU PMH: Gross hematuria - 03/14/2016 Kidney Stone (Stable), She is having no flank pain or hematuria. - 12/25/2015, Kidney Stone, Nephrolithiasis - 08/23/2015 Postmenopausal atrophic vaginitis, With her recurrent infections, I think she will benefit from topical estrogen. - 12/25/2015 Recurrent Cystitis w/o hematuria (Stable), She is asymptomatic on TMP but had a breakthrough in May with some symptoms and Klebsiella on culture. - 12/25/2015 Urinary Tract Inf, Unspec site,  Urinary tract infection - 11/14/2015 Dysuria, Dysuria - 11/08/2015 Personal Hx Urinary Tract Infections, History of recurrent UTIs - [id number] Renal Colic, Renal colic - [id number] Calculus Ureter, Calculus of distal right ureter - 05/04/2015    NON-GU PMH: Encounter for general adult medical examination without abnormal findings, Encounter for preventive health examination - 07/17/2015 Personal history of other infectious and parasitic diseases, History of sepsis - 05/04/2015 Anxiety, Anxiety - 2015-05-04 Personal history of other diseases of the circulatory system, History of hypertension - 05/04/2015 Personal history of other diseases of the musculoskeletal system and connective tissue, History of arthritis - 05/04/15 Personal history of other endocrine, nutritional and metabolic disease, History of hypercholesterolemia - 2015-05-04 Personal history of other mental and behavioral disorders, History of depression - 04-May-2015    FAMILY HISTORY: Deceased - Runs In Family   SOCIAL HISTORY: Marital Status: Married Current Smoking Status: Patient smokes. Has smoked since 03/01/1966. Smokes less than 1/2 pack per day.  Has never drank.  Drinks 1 caffeinated drink per day. Patient's occupation is/was Retired.    REVIEW OF SYSTEMS:    GU Review Female:   Patient reports get up at night to urinate. Patient denies frequent urination, hard to postpone urination, burning /pain with urination, leakage of urine, stream starts and stops, trouble starting your stream, have to strain to urinate, and currently pregnant.  Gastrointestinal (Upper):   Patient reports indigestion/ heartburn and nausea. Patient denies vomiting.  Gastrointestinal (Lower):   Patient denies diarrhea and constipation.  Constitutional:   Patient reports night sweats. Patient denies fever, weight loss, and fatigue.  Skin:   Patient denies skin rash/ lesion and itching.  Eyes:   Patient denies blurred vision and double vision.   Ears/ Nose/ Throat:   Patient reports sinus problems. Patient denies sore throat.  Hematologic/Lymphatic:   Patient denies swollen glands and easy bruising.  Cardiovascular:   Patient denies leg swelling and chest pains.  Respiratory:   Patient denies cough and shortness of breath.  Endocrine:   Patient denies excessive thirst.  Musculoskeletal:   Patient reports joint pain. Patient denies back pain.  Neurological:   Patient denies headaches and dizziness.  Psychologic:   Patient reports depression. Patient denies anxiety.   VITAL SIGNS:      03/26/2016 09:04 AM  BP 145/66 mmHg  Pulse 76 /min  Temperature 97.0 F / 36 C   MULTI-SYSTEM PHYSICAL EXAMINATION:    Constitutional: Well-nourished. No physical deformities. Normally developed. Good grooming.  Neck: Neck symmetrical, not swollen. Normal tracheal position.  Respiratory: No labored breathing, no use of accessory muscles. CTA.  Cardiovascular: Normal temperature, normal extremity pulses, no swelling, no varicosities. RRR. s1/s2 auscultated.  Skin: No paleness, no jaundice, no cyanosis. No lesion, no ulcer, no rash.  Neurologic / Psychiatric: Oriented to time, oriented to place, oriented to person. No depression, no anxiety, no agitation.  Gastrointestinal: No mass, no tenderness, no rigidity, non obese abdomen.  Musculoskeletal: Spine, ribs, pelvis no bilateral tenderness. Normal gait and station of head and neck.     PAST DATA REVIEWED:  Source Of History:  Patient, Family/Caregiver  Records Review:   Previous Patient Records  Urine Test Review:   Urinalysis, Urine Culture and Sensitivity  X-Ray Review: KUB: Reviewed Films.  [facility] Protocol: Reviewed Films. CT imaging last week did not indicate any hydronephrosis.    03/26/16 03/26/16  Urinalysis  Urine Appearance Clear  Clear   Urine Specimen Voided  Staff Cath   Urine Color Yellow  Yellow   Urine Glucose Neg  Neg   Urine Bilirubin Neg  Neg   Urine Ketones Neg  Neg    Urine Specific Gravity 1.025  1.025   Urine Blood 2+    Urine pH 5.5  5.0   Urine Protein Neg  Trace   Urine Urobilinogen 0.2  0.2   Urine Nitrites Neg  Neg   Urine Leukocyte Esterase Neg  Neg   Urine WBC/hpf 0-5/hpf  0-5/hpf   Urine RBC/hpf 10-20/hpf  10-20/hpf   Urine Epithelial Cells 6-10/hpf  0-5/hpf   Urine Bacteria Rare  Rare   Urine Mucous Not Present  Not Present   Urine Yeast NS (Not Seen)  Few (1-5/hpf)   Urine Trichomonas Not Present  Not Present   Urine Cystals NS (Not Seen)  NS (Not Seen)   Urine Casts NS (Not Seen)  NS (Not Seen)   Urine Sperm Not Present  Not Present   Notes:                     10-20 rbc/hpf seen on cath UA. sent for urine c/s.   PROCEDURES:         KUB - 74000  A single view of the abdomen is obtained.      The large renal stone on the left side re-identified with an arrow drawn in PACS for identification during ESWL. I measured around 1 cm at the areas greatest width.          Catheter / SP Tube - 51701 In and Out Catheterization  A 14  French red rubber or straight catheter was inserted into the bladder using sterile technique. 100 cc of urine was obtained. A urinalysis was sent to the lab. A urine culture was sent to the lab.         Urinalysis w/Scope - 81001 Dipstick Dipstick Cont'd Micro  Specimen: Voided Bilirubin: Neg WBC/hpf: 0-5/hpf  Color: Yellow Ketones: Neg RBC/hpf: 10-20/hpf  Appearance: Clear Blood: 2+ Bacteria: Rare  Specific Gravity: 1.025 Protein: Neg Cystals: NS (Not Seen)  pH: 5.5 Urobilinogen: 0.2 Casts: NS (Not Seen)  Glucose: Neg Nitrites: Neg Trichomonas: Not Present    Leukocyte Esterase: Neg Mucous: Not Present      Epithelial Cells: 6-10/hpf      Yeast: NS (Not Seen)      Sperm: Not Present         Urinalysis 2nd - 81003 Dipstick Dipstick Cont'd Micro  Specimen: Staff Cath Bilirubin: Neg WBC/hpf: 0-5/hpf  Color: Yellow Ketones: Neg RBC/hpf: 10-20/hpf  Appearance: Clear Protein: Trace Bacteria: Rare   Specific Gravity: 1.025 Urobilinogen: 0.2 Cystals: NS (Not Seen)  pH: 5.0 Nitrites: Neg Casts: NS (Not Seen)  Glucose: Neg Leukocyte Esterase: Neg Trichomonas: Not Present      Mucous: Not Present      Epithelial Cells: 0-5/hpf      Yeast: Few (1-5/hpf)      Sperm: Not Present    ASSESSMENT:      ICD-10 Details  1 GU:   Kidney Stone - N20.0   2   Recurrent Cystitis w/o hematuria - N30.20    PLAN:           Orders Labs Urinalysis 2nd, Urine Culture and Sensitivity  X-Rays: KUB  X-Ray Notes: ...          Schedule Return Visit: Next Available Appointment - Schedule Surgery  Procedure: 03/26/2016 at [facility] [phone] - In and Out Catheterization (Catheterize For Residual) - 51701          Document Letter(s):  Created for Patient: Clinical Summary         Notes:   he will remain on current antimicrobial therapy while awaiting Urine culture results. Plan of care is to keep the urine sterile with antimicrobial therapy until lithotripsy for the large left renal stone can be performed. Patient has underwent this procedure before. She understands the risks and benefits of the procedure and the possibility/need for repeat procedure. Appropriate follow-up instructions given for worsening symptoms now and post procedure. Understanding expressed by the patient and her daughter.    * Signed by Jiles Crocker on 03/26/16 at 10:28 AM (EDT)*     The information contained in this medical record document is considered private and confidential patient information. This information can only be used for the medical diagnosis and/or medical services that are being provided by the patient's selected caregivers. This information can only be distributed outside of the patient's care if the patient agrees and signs waivers of authorization for this information to be sent to an outside source or route.

## 2016-04-01 NOTE — Op Note (Signed)
See [facility] OP note scanned into chart. 

## 2016-04-01 NOTE — Discharge Instructions (Addendum)
See [facility] discharge instructions in chart.  May restart aspirin in 5 days

## 2016-04-02 ENCOUNTER — Ambulatory Visit: Payer: Commercial Managed Care - HMO | Admitting: Family

## 2016-04-04 DIAGNOSIS — C44729 Squamous cell carcinoma of skin of left lower limb, including hip: Secondary | ICD-10-CM | POA: Diagnosis not present

## 2016-04-05 ENCOUNTER — Encounter: Payer: Self-pay | Admitting: Family

## 2016-04-05 ENCOUNTER — Ambulatory Visit (INDEPENDENT_AMBULATORY_CARE_PROVIDER_SITE_OTHER): Payer: Commercial Managed Care - HMO | Admitting: Family

## 2016-04-05 VITALS — BP 141/77 | HR 70 | Temp 97.1°F | Ht 71.0 in | Wt 214.6 lb

## 2016-04-05 DIAGNOSIS — I10 Essential (primary) hypertension: Secondary | ICD-10-CM | POA: Diagnosis not present

## 2016-04-05 DIAGNOSIS — E1165 Type 2 diabetes mellitus with hyperglycemia: Secondary | ICD-10-CM

## 2016-04-05 DIAGNOSIS — F329 Major depressive disorder, single episode, unspecified: Secondary | ICD-10-CM | POA: Diagnosis not present

## 2016-04-05 DIAGNOSIS — M1990 Unspecified osteoarthritis, unspecified site: Secondary | ICD-10-CM | POA: Diagnosis not present

## 2016-04-05 DIAGNOSIS — E8881 Metabolic syndrome: Secondary | ICD-10-CM

## 2016-04-05 DIAGNOSIS — M160 Bilateral primary osteoarthritis of hip: Secondary | ICD-10-CM

## 2016-04-05 DIAGNOSIS — B372 Candidiasis of skin and nail: Secondary | ICD-10-CM

## 2016-04-05 DIAGNOSIS — K219 Gastro-esophageal reflux disease without esophagitis: Secondary | ICD-10-CM | POA: Diagnosis not present

## 2016-04-05 DIAGNOSIS — E785 Hyperlipidemia, unspecified: Secondary | ICD-10-CM

## 2016-04-05 DIAGNOSIS — Z23 Encounter for immunization: Secondary | ICD-10-CM | POA: Diagnosis not present

## 2016-04-05 DIAGNOSIS — F32A Depression, unspecified: Secondary | ICD-10-CM

## 2016-04-05 DIAGNOSIS — E669 Obesity, unspecified: Secondary | ICD-10-CM

## 2016-04-05 DIAGNOSIS — F411 Generalized anxiety disorder: Secondary | ICD-10-CM | POA: Diagnosis not present

## 2016-04-05 LAB — BAYER DCA HB A1C WAIVED: HB A1C (BAYER DCA - WAIVED): 6.9 % (ref <7.0)

## 2016-04-05 MED ORDER — NYSTATIN 100000 UNIT/GM EX POWD: Freq: Four times a day (QID) | CUTANEOUS | 1 refills | Status: DC

## 2016-04-05 NOTE — Patient Instructions (Signed)
Diabetes Mellitus and Food It is important for you to manage your blood sugar (glucose) level. Your blood glucose level can be greatly affected by what you eat. Eating healthier foods in the appropriate amounts throughout the day at about the same time each day will help you control your blood glucose level. It can also help slow or prevent worsening of your diabetes mellitus. Healthy eating may even help you improve the level of your blood pressure and reach or maintain a healthy weight.  General recommendations for healthful eating and cooking habits include:  Eating meals and snacks regularly. Avoid going long periods of time without eating to lose weight.  Eating a diet that consists mainly of plant-based foods, such as fruits, vegetables, nuts, legumes, and whole grains.  Using low-heat cooking methods, such as baking, instead of high-heat cooking methods, such as deep frying. Work with your dietitian to make sure you understand how to use the Nutrition Facts information on food labels. HOW CAN FOOD AFFECT ME? Carbohydrates Carbohydrates affect your blood glucose level more than any other type of food. Your dietitian will help you determine how many carbohydrates to eat at each meal and teach you how to count carbohydrates. Counting carbohydrates is important to keep your blood glucose at a healthy level, especially if you are using insulin or taking certain medicines for diabetes mellitus. Alcohol Alcohol can cause sudden decreases in blood glucose (hypoglycemia), especially if you use insulin or take certain medicines for diabetes mellitus. Hypoglycemia can be a life-threatening condition. Symptoms of hypoglycemia (sleepiness, dizziness, and disorientation) are similar to symptoms of having too much alcohol.  If your health care provider has given you approval to drink alcohol, do so in moderation and use the following guidelines:  Women should not have more than one drink per day, and men  should not have more than two drinks per day. One drink is equal to:  12 oz of beer.  5 oz of wine.  1 oz of hard liquor.  Do not drink on an empty stomach.  Keep yourself hydrated. Have water, diet soda, or unsweetened iced tea.  Regular soda, juice, and other mixers might contain a lot of carbohydrates and should be counted. WHAT FOODS ARE NOT RECOMMENDED? As you make food choices, it is important to remember that all foods are not the same. Some foods have fewer nutrients per serving than other foods, even though they might have the same number of calories or carbohydrates. It is difficult to get your body what it needs when you eat foods with fewer nutrients. Examples of foods that you should avoid that are high in calories and carbohydrates but low in nutrients include:  Trans fats (most processed foods list trans fats on the Nutrition Facts label).  Regular soda.  Juice.  Candy.  Sweets, such as cake, pie, doughnuts, and cookies.  Fried foods. WHAT FOODS CAN I EAT? Eat nutrient-rich foods, which will nourish your body and keep you healthy. The food you should eat also will depend on several factors, including:  The calories you need.  The medicines you take.  Your weight.  Your blood glucose level.  Your blood pressure level.  Your cholesterol level. You should eat a variety of foods, including:  Protein.  Lean cuts of meat.  Proteins low in saturated fats, such as fish, egg whites, and beans. Avoid processed meats.  Fruits and vegetables.  Fruits and vegetables that may help control blood glucose levels, such as apples, mangoes, and   yams.  Dairy products.  Choose fat-free or low-fat dairy products, such as milk, yogurt, and cheese.  Grains, bread, pasta, and rice.  Choose whole grain products, such as multigrain bread, whole oats, and brown rice. These foods may help control blood pressure.  Fats.  Foods containing healthful fats, such as nuts,  avocado, olive oil, canola oil, and fish. DOES EVERYONE WITH DIABETES MELLITUS HAVE THE SAME MEAL PLAN? Because every person with diabetes mellitus is different, there is not one meal plan that works for everyone. It is very important that you meet with a dietitian who will help you create a meal plan that is just right for you.   This information is not intended to replace advice given to you by your health care provider. Make sure you discuss any questions you have with your health care provider.   Document Released: 03/14/2005 Document Revised: 07/08/2014 Document Reviewed: 05/14/2013 Elsevier Interactive Patient Education 2016 [employer]  

## 2016-04-05 NOTE — Progress Notes (Signed)
Subjective:    Patient ID: Caitlin Mccarthy, female    DOB: 01-31-1936, 80 y.o.   MRN: [id number]  Patient here today for follow up of chronic medical problems.  Diabetes  She presents for her follow-up diabetic visit. She has type 2 diabetes mellitus. Hypoglycemia symptoms include nervousness/anxiousness. Pertinent negatives for diabetes include no blurred vision, no chest pain, no polydipsia, no polyphagia and no polyuria. There are no hypoglycemic complications. Pertinent negatives for diabetic complications include no CVA, heart disease, nephropathy or peripheral neuropathy. Risk factors for coronary artery disease include diabetes mellitus, dyslipidemia, obesity, hypertension, sedentary lifestyle and post-menopausal. Current diabetic treatment includes oral agent (monotherapy). She is compliant with treatment all of the time. Her weight is stable. She is following a diabetic diet. Her breakfast blood glucose range is generally 110-130 mg/dl. An ACE inhibitor/angiotensin II receptor blocker is not being taken. Eye exam is current.  Hypertension  This is a chronic problem. The current episode started more than 1 year ago. The problem has been waxing and waning since onset. The problem is controlled. Pertinent negatives include no blurred vision, chest pain, palpitations, peripheral edema or shortness of breath. Risk factors for coronary artery disease include dyslipidemia, family history, obesity, smoking/tobacco exposure, post-menopausal state and sedentary lifestyle. Past treatments include calcium channel blockers, ACE inhibitors and diuretics. The current treatment provides moderate improvement. There is no history of kidney disease, CAD/MI, CVA, heart failure or a thyroid problem.  Hyperlipidemia  This is a chronic problem. The current episode started more than 1 year ago. The problem is uncontrolled. Recent lipid tests were reviewed and are high. Exacerbating diseases include obesity. She has no  history of diabetes or hypothyroidism. Factors aggravating her hyperlipidemia include smoking. Pertinent negatives include no chest pain, myalgias or shortness of breath. Current antihyperlipidemic treatment includes statins. The current treatment provides moderate improvement of lipids. Compliance problems include adherence to diet and adherence to exercise.  Risk factors for coronary artery disease include dyslipidemia, hypertension, obesity, post-menopausal and a sedentary lifestyle.  Gastroesophageal Reflux  She reports no belching, no chest pain, no heartburn, no nausea or no water brash. This is a chronic problem. The current episode started more than 1 year ago. The problem occurs occasionally. The problem has been waxing and waning. The heartburn does not wake her from sleep. The symptoms are aggravated by certain foods and lying down. Risk factors include obesity. She has tried a PPI for the symptoms. The treatment provided mild relief.  Arthritis  Presents for follow-up visit. The disease course has been fluctuating. She complains of pain, joint swelling and joint warmth. Affected locations include the right knee and left knee (right hip). Her pain is at a severity of 10/10. Associated symptoms include rash. Pertinent negatives include no dry mouth, dysuria, pain at night, pain while resting or uveitis. Her past medical history is significant for osteoarthritis. Past treatments include rest and NSAIDs. The treatment provided mild relief.  Anxiety  Presents for initial visit. Onset was more than 5 years ago. The problem has been waxing and waning. Symptoms include excessive worry, irritability, nervous/anxious behavior and restlessness. Patient reports no chest pain, dry mouth, nausea, palpitations or shortness of breath. Symptoms occur most days. The symptoms are aggravated by family issues. The quality of sleep is good.   Her past medical history is significant for anxiety/panic attacks.  Rash   This is a new problem. The current episode started 1 to 4 weeks ago. The problem has been gradually worsening since  onset. Location: bilateral breast and groin. The rash is characterized by pain and redness. She was exposed to nothing. Pertinent negatives include no shortness of breath. Past treatments include moisturizer.  Metabolic syndrome Does not check blood sugars at home. Tries to watch carb intake    Review of Systems  Constitutional: Positive for irritability.  HENT: Negative.   Eyes: Negative.  Negative for blurred vision.  Respiratory: Negative for shortness of breath.   Cardiovascular: Negative.  Negative for chest pain, palpitations and leg swelling.  Gastrointestinal: Negative for heartburn and nausea.  Endocrine: Negative for polydipsia, polyphagia and polyuria.  Genitourinary: Negative.  Negative for dysuria.       Denies urinary incontinence  Musculoskeletal: Positive for arthralgias, arthritis, gait problem and joint swelling. Negative for myalgias.  Skin: Positive for rash.  Psychiatric/Behavioral: The patient is nervous/anxious.   All other systems reviewed and are negative.      Objective:   Physical Exam  Constitutional: She is oriented to person, place, and time. She appears well-developed and well-nourished.  HENT:  Nose: Nose normal.  Mouth/Throat: Oropharynx is clear and moist.  Eyes: EOM are normal.  Neck: Trachea normal, normal range of motion and full passive range of motion without pain. Neck supple. No JVD present. Carotid bruit is not present. No thyromegaly present.  Cardiovascular: Normal rate, regular rhythm, normal heart sounds and intact distal pulses.  Exam reveals no gallop and no friction rub.   No murmur heard. Pulmonary/Chest: Effort normal and breath sounds normal. No respiratory distress.  Abdominal: Soft. Bowel sounds are normal. She exhibits no distension and no mass. There is no tenderness.  Musculoskeletal: She exhibits edema (trace  amt in bilateral knees) and tenderness.  Pt walking with cane and slow movements with gait related to pain  Lymphadenopathy:    She has no cervical adenopathy.  Neurological: She is alert and oriented to person, place, and time. She has normal reflexes.  Skin: Skin is warm and dry. Rash (erythemas rash under bilateral breast and groin) noted.  Psychiatric: She has a normal mood and affect. Her behavior is normal. Judgment and thought content normal.   BP (!) 145/81   Pulse 73   Temp 97.1 F (36.2 C) (Oral)   Ht 5' 11" (1.803 m)   Wt 214 lb 9.6 oz (97.3 kg)   BMI 29.93 kg/m        Assessment & Plan:  1. Essential hypertension, benign - CMP14+EGFR  2. Gastroesophageal reflux disease without esophagitis - CMP14+EGFR  3. Type 2 diabetes mellitus with hyperglycemia, without long-term current use of insulin (HCC) - Bayer DCA Hb A1c Waived - CMP14+EGFR - Microalbumin / creatinine urine ratio  4. Primary osteoarthritis of both hips - CMP14+EGFR  5. Depression, unspecified depression type - CMP14+EGFR  6. GAD (generalized anxiety disorder) - CMP14+EGFR  7. Hyperlipidemia with target LDL less than 100 - CMP14+EGFR - Lipid panel  8. Metabolic syndrome - GNO03+BCWU  9. Obesity (BMI 30-39.9) - CMP14+EGFR  10. Arthritis - CMP14+EGFR  11. Candidiasis of skin - nystatin (MYCOSTATIN/NYSTOP) powder; Apply topically 4 (four) times daily.  Dispense: 45 g; Refill: 1   Continue all meds Labs pending Health Maintenance reviewed Diet and exercise encouraged RTO 3 months  Evelina Dun, FNP

## 2016-04-06 LAB — LIPID PANEL
CHOLESTEROL TOTAL: 176 mg/dL (ref 100–199)
Chol/HDL Ratio: 3.7 ratio units (ref 0.0–4.4)
HDL: 48 mg/dL (ref >39)
LDL Calculated: 94 mg/dL (ref 0–99)
Triglycerides: 172 mg/dL — ABNORMAL HIGH (ref 0–149)
VLDL Cholesterol Cal: 34 mg/dL (ref 5–40)

## 2016-04-06 LAB — CMP14+EGFR
ALBUMIN: 4.7 g/dL (ref 3.5–4.8)
ALT: 25 IU/L (ref 0–32)
AST: 36 IU/L (ref 0–40)
Albumin/Globulin Ratio: 1.4 (ref 1.2–2.2)
Alkaline Phosphatase: 45 IU/L (ref 39–117)
BILIRUBIN TOTAL: 0.4 mg/dL (ref 0.0–1.2)
BUN / CREAT RATIO: 19 (ref 12–28)
BUN: 14 mg/dL (ref 8–27)
CALCIUM: 11.3 mg/dL — AB (ref 8.7–10.3)
CHLORIDE: 100 mmol/L (ref 96–106)
CO2: 25 mmol/L (ref 18–29)
CREATININE: 0.72 mg/dL (ref 0.57–1.00)
GFR, EST AFRICAN AMERICAN: 93 mL/min/{1.73_m2} (ref >59)
GFR, EST NON AFRICAN AMERICAN: 81 mL/min/{1.73_m2} (ref >59)
GLUCOSE: 88 mg/dL (ref 65–99)
Globulin, Total: 3.4 g/dL (ref 1.5–4.5)
POTASSIUM: 4 mmol/L (ref 3.5–5.2)
Sodium: 140 mmol/L (ref 134–144)
TOTAL PROTEIN: 8.1 g/dL (ref 6.0–8.5)

## 2016-04-06 LAB — MICROALBUMIN / CREATININE URINE RATIO
CREATININE, UR: 61.1 mg/dL
MICROALBUM., U, RANDOM: 76.5 ug/mL
Microalb/Creat Ratio: 125.2 mg/g creat — ABNORMAL HIGH (ref 0.0–30.0)

## 2016-04-08 ENCOUNTER — Other Ambulatory Visit: Payer: Self-pay | Admitting: Family

## 2016-04-22 DIAGNOSIS — N3020 Other chronic cystitis without hematuria: Secondary | ICD-10-CM | POA: Diagnosis not present

## 2016-04-22 DIAGNOSIS — N201 Calculus of ureter: Secondary | ICD-10-CM | POA: Diagnosis not present

## 2016-04-26 DIAGNOSIS — N200 Calculus of kidney: Secondary | ICD-10-CM | POA: Diagnosis not present

## 2016-05-09 ENCOUNTER — Other Ambulatory Visit: Payer: Self-pay | Admitting: Family

## 2016-05-09 ENCOUNTER — Encounter: Payer: Self-pay | Admitting: Pharmacist

## 2016-05-09 ENCOUNTER — Ambulatory Visit (INDEPENDENT_AMBULATORY_CARE_PROVIDER_SITE_OTHER): Payer: Commercial Managed Care - HMO | Admitting: Pharmacist

## 2016-05-09 DIAGNOSIS — M810 Age-related osteoporosis without current pathological fracture: Secondary | ICD-10-CM

## 2016-05-09 DIAGNOSIS — F411 Generalized anxiety disorder: Secondary | ICD-10-CM

## 2016-05-09 MED ORDER — CALCIUM CARBONATE-VITAMIN D 500-200 MG-UNIT PO TABS: 1.0000 | ORAL_TABLET | Freq: Every day | ORAL | Status: DC

## 2016-05-09 MED ORDER — ALENDRONATE SODIUM 70 MG PO TABS
70.0000 mg | ORAL_TABLET | ORAL | 11 refills | Status: DC
Start: 2016-05-09 — End: 2016-05-09

## 2016-05-09 MED ORDER — ESCITALOPRAM OXALATE 20 MG PO TABS: 20.0000 mg | ORAL_TABLET | Freq: Every day | ORAL | 0 refills | Status: DC

## 2016-05-09 MED ORDER — ALENDRONATE SODIUM 70 MG PO TABS: 70.0000 mg | ORAL_TABLET | ORAL | 11 refills | Status: DC

## 2016-05-09 NOTE — Progress Notes (Signed)
Patient ID: Caitlin Mccarthy, female   DOB: 20-Jun-1936, 80 y.o.   MRN: [id number]   [facility] Current Height: Height: 5\' 11"  (180.3 cm)      Max Lifetime Height:  5\' 11"  Current Weight: Weight: 215 lb (97.5 kg)       Ethnicity:Caucasian   HPI: Does pt already have a diagnosis of:  Osteopenia?  Yes Osteoporosis?  No  Back Pain?  Yes       Kyphosis?  No Prior fracture?  Yes  - broke wrist in the early 2000's Med(s) for Osteoporosis/Osteopenia:  None currently Med(s) previously tried for Osteoporosis/Osteopenia:  Took Actonel for about 2 years but stopped due to cost (stopped about 2011)                                                             PMH: Age at menopause:  About 80 yo Hysterectomy?  No Oophorectomy?  No HRT? Yes - Former.  Type/duration: estrogen - took for about 2-3 years Steroid Use?  No Thyroid med?  No History of cancer?  No History of digestive disorders (ie Crohn's)?  No Current or previous eating disorders?  No Last Vitamin D Result:  Not checked within last 2-3 years. Last GFR Result:  81 (04/05/2016)  Fall Risk  05/09/2016 04/05/2016 01/31/2016 12/28/2015 07/06/2015  Falls in the past year? No No Yes Yes Yes  Number falls in past yr: - - 1 1 1   Injury with Fall? - - No No No  Risk for fall due to : History of fall(s);Impaired balance/gait - - - [place] August 2016 - patient had UTI and was taken to ER / hospitalized Uses cane to walk.  She has a rolling walker at home with seat that she uses form time to time. Has consulted with physical therapy for balance and post knee replacement.   FH/SH: Family history of osteoporosis?  No Parent with history of hip fracture?  No Family history of breast cancer?  No Exercise?  No Smoking?  Yes - 40 pack year history; current smoker Has quit 3 times in the past - for about 5 years each time. Alcohol?  No     Depression screen [facility] 2/9 05/09/2016 04/05/2016 01/31/2016 12/28/2015 07/06/2015  Decreased Interest 1  0 0 0 0  Down, Depressed, Hopeless 2 1 0 0 0  PHQ - 2 Score 3 1 0 0 0  Altered sleeping 0 - - - -  Tired, decreased energy 1 - - - -  Change in appetite 0 - - - -  Feeling bad or failure about yourself  2 - - - -  Trouble concentrating 0 - - - -  Moving slowly or fidgety/restless 1 - - - -  Suicidal thoughts 0 - - - -  PHQ-9 Score 7 - - - -    Calcium Assessment Calcium Intake  # of servings/day  Calcium mg  Milk (8 oz) 0.5  x  300  = 150mg   Yogurt (4 oz) 0.5 x  200 = 100mg   Cheese (1 oz) 1 x  200 = 200mg   Other Calcium sources   250mg   Ca supplement None = 0   Estimated calcium intake per day 700mg     DEXA Results Date of Test T-Score  for AP Spine L1-L4 T-Score for Neck of Left Hip T-Score for Right Forearm  01/31/2016 -1.1 -1.8 -2.6  04/14/2007 -1.3 -1.8 --              Assessment: osteoporosis  GAD with depression  Recommendations: 1.  Start  alendronate (FOSAMAX) 70mg  take 1 tablet by mouth once per week 2.  recommend calcium 1200mg  daily through supplementation and / or diet.  3.  recommend weight bearing exercise - 30 minutes at least 4 days per week.   4.  Counseled and educated about fall risk and prevention. 5.  Discussed depression score with patient's provider.  Recommended increase escitalopram to 20mg  daily.    Recheck DEXA:  2 years  Time spent counseling patient:  35 minutes

## 2016-05-09 NOTE — Patient Instructions (Addendum)
Try to stop smoking.  Smoking is associated with increase in broken bones and weak bones.   Start alendroante 70mg  for bones - take 1 tablet once per week.  Take on an empty stomach with a glass of water (at least 4 ounces).   Increase escitalopram from 10mg  to 20mg  - take 1 tablet daily (you can take 2 tablets of the 10mg  until finished - then start the 20mg  strength and take 1 tablet daily).  If no improvement in 2 weeks then call office to make appointment to see [facility].  Calcium & Vitamin D: The Facts  Why is calcium and vitamin D consumption important? Calcium: . Most Americans do not consume adequate amounts of calcium! Calcium is required for proper muscle function, nerve communication, bone support, and many other functions in the body.  . The body uses bones as a source of calcium. Bones 'remodel' themselves continuously - the body constantly breaks bone down to release calcium and rebuilds bones by replacing calcium in the bone later.  . As we get older, the rate of bone breakdown occurs faster than bone rebuilding which could lead to osteopenia, osteoporosis, and possible fractures.   Vitamin D: . People naturally make vitamin D in the body when sunlight hits the skin and triggers a process that leads to vitamin D production. This natural vitamin D production requires about 10-15 minutes of sun exposure on the hands, arms, and face at least 2-3 times per week. However, due to decreased sun exposure and the use of sunscreen, most people will need to get additional vitamin D from foods or supplements. Your doctor can measure your body's vitamin D level through a simple blood test to determine your daily vitamin D needs.  . Vitamin D is used to help the body absorb calcium, maintain bone health, help the immune system, and reduce inflammation. It also plays a role in muscle performance, balance and risk of falling.  . Vitamin D deficiency can lead to osteomalacia or softening of the bones,  bone pain, and muscle weakness.   The recommended daily allowance of Calcium and Vitamin D varies for different age groups. Age group Calcium (mg) Vitamin D (IU)  Females and Males: Age 15-50 1000 mg 600 IU  Females: Age 59- 108 1200 mg 600 IU  Males: Age 36-70 1000 mg 600 IU  Females and Males: Age 45+ 1200 mg 800 IU  Pregnant/lactating Females age 31-50 1000 mg 600 IU   How much Calcium do you get in your diet? Calcium Intake # of servings per day  Total calcium (mg)  Skim milk, 2% milk (1 cup) _________ x 300 mg   Yogurt (1 small container) _________ x 200 mg   Cheese (1oz) _________ x 200 mg   Cottage Cheese (1 cup)             ________ x 150 mg   Almond milk (1 cup) _________ x 450 mg   Fortified Orange Juice (1 cup) _________ x 300 mg   Broccoli or spinach ( 1 cup) _________ x 100 mg   Salmon (3 oz) _________ x 150 mg    Almonds (1/4 cup) _______ x 90 mg      How do we get Calcium and Vitamin D in our diet? Calcium: . Obtaining calcium from the diet is the most preferred way to reach the recommended daily goal. If this goal is not reached through diet, calcium supplements are available.  . Calcium is found in many foods  including: dairy products, dark leafy vegetables (like broccoli, kale, and spinach), fish, and fortified products like juices and cereals.  . The food label will have a %DV (percent daily value) listed showing the amount of calcium per serving. To determine the total mg per serving, simply replace the % with zero (0).  For example, Almond Breeze almond milk contains 45% DV of calcium or 450mg  per 1 cup.  . You can increase the amount of calcium in your diet by using more calcium products in your daily meals. Use yogurt and fruit to make smoothies or use yogurt to top baked potatoes or make whipped potatoes. Sprinkle low fat cheese onto salads or into egg white omelets. You can even add non-fat dry milk powder (300mg  calcium per 1/3 cup) to hot cereals, meat loaf,  soups, or potatoes.  . Calcium supplements come in many forms including tablets, chewables, and gummies. Be sure to read the label to determine the correct number of tablets per serving and whether or not to take the supplement with food.  . Calcium carbonate products (Oscal, Caltrate, and Viactiv) are generally better absorbed when taken with food while calcium citrate products like Citracal can be taken with or without food.  . The body can only absorb about 600 mg of calcium at one time. It is recommended to take calcium supplements in small amounts several times per day.  However, taking it all at once is better than not taking it at all. . Increasing your intake of calcium is essential for bone health, but may also lead to some side effects like constipation, increased gas, bloating or abdominal cramping. To help reduce these side effects, start with 1 tablet per day and slowly increase your intake of the supplement to the recommended doses. It is also recommended that you drink plenty of water each day. Vitamin D: . Very few foods naturally contain vitamin D. However, it is found in saltwater fish (like tuna, salmon and mackerel), beef liver, egg yolks, cheese and vitamin D fortified foods (like yogurt, cereals, orange juice and milk) . The amount of vitamin D in each food or product is listed as %DV on the product label. To determine the total amount of vitamin D per serving, drop the % sign and multiply the number by 4. For example, 1 cup of Almond Breeze almond milk contains 25% DV vitamin D or 100 IU per serving (25 x 4 =100). . Vitamin D is also found in multivitamins and supplements and may be listed as ergocalciferol (vitamin D2) or cholecalciferol (vitamin D3). Each of these forms of vitamin D are equivalent and the daily recommended intake will vary based on your age and the vitamin D levels in your body. Follow your doctor's recommendation for vitamin D intake.       Fall Prevention in  the Home  Falls can cause injuries and can affect people from all age groups. There are many simple things that you can do to make your home safe and to help prevent falls. WHAT CAN I DO ON THE OUTSIDE OF MY HOME?  Regularly repair the edges of walkways and driveways and fix any cracks.  Remove high doorway thresholds.  Trim any shrubbery on the main path into your home.  Use bright outdoor lighting.  Clear walkways of debris and clutter, including tools and rocks.  Regularly check that handrails are securely fastened and in good repair. Both sides of any steps should have handrails.  Install guardrails along the  edges of any raised decks or porches.  Have leaves, snow, and ice cleared regularly.  Use sand or salt on walkways during winter months.  In the garage, clean up any spills right away, including grease or oil spills. WHAT CAN I DO IN THE BATHROOM?  Use night lights.  Install grab bars by the toilet and in the tub and shower. Do not use towel bars as grab bars.  Use non-skid mats or decals on the floor of the tub or shower.  If you need to sit down while you are in the shower, use a plastic, non-slip stool.[name]  Keep the floor dry. Immediately clean up any water that spills on the floor.  Remove soap buildup in the tub or shower on a regular basis.  Attach bath mats securely with double-sided non-slip rug tape.  Remove throw rugs and other tripping hazards from the floor. WHAT CAN I DO IN THE BEDROOM?  Use night lights.  Make sure that a bedside light is easy to reach.  Do not use oversized bedding that drapes onto the floor.  Have a firm chair that has side arms to use for getting dressed.  Remove throw rugs and other tripping hazards from the floor. WHAT CAN I DO IN THE KITCHEN?   Clean up any spills right away.  Avoid walking on wet floors.  Place frequently used items in easy-to-reach places.  If you need to reach for something above you, use a  sturdy step stool that has a grab bar.  Keep electrical cables out of the way.  Do not use floor polish or wax that makes floors slippery. If you have to use wax, make sure that it is non-skid floor wax.  Remove throw rugs and other tripping hazards from the floor. WHAT CAN I DO IN THE STAIRWAYS?  Do not leave any items on the stairs.  Make sure that there are handrails on both sides of the stairs. Fix handrails that are broken or loose. Make sure that handrails are as long as the stairways.  Check any carpeting to make sure that it is firmly attached to the stairs. Fix any carpet that is loose or worn.  Avoid having throw rugs at the top or bottom of stairways, or secure the rugs with carpet tape to prevent them from moving.  Make sure that you have a light switch at the top of the stairs and the bottom of the stairs. If you do not have them, have them installed. WHAT ARE SOME OTHER FALL PREVENTION TIPS?  Wear closed-toe shoes that fit well and support your feet. Wear shoes that have rubber soles or low heels.  When you use a stepladder, make sure that it is completely opened and that the sides are firmly locked. Have someone hold the ladder while you are using it. Do not climb a closed stepladder.  Add color or contrast paint or tape to grab bars and handrails in your home. Place contrasting color strips on the first and last steps.  Use mobility aids as needed, such as canes, walkers, scooters, and crutches.  Turn on lights if it is dark. Replace any light bulbs that burn out.  Set up furniture so that there are clear paths. Keep the furniture in the same spot.  Fix any uneven floor surfaces.  Choose a carpet design that does not hide the edge of steps of a stairway.  Be aware of any and all pets.  Review your medicines with your healthcare  provider. Some medicines can cause dizziness or changes in blood pressure, which increase your risk of falling. Talk with your health  care provider about other ways that you can decrease your risk of falls. This may include working with a physical therapist or trainer to improve your strength, balance, and endurance.   This information is not intended to replace advice given to you by your health care provider. Make sure you discuss any questions you have with your health care provider.   Document Released: 06/07/2002 Document Revised: 11/01/2014 Document Reviewed: 07/22/2014 Elsevier Interactive Patient Education [employer].

## 2016-05-10 ENCOUNTER — Encounter: Payer: Self-pay | Admitting: *Deleted

## 2016-05-10 DIAGNOSIS — R769 Abnormal immunological finding in serum, unspecified: Secondary | ICD-10-CM | POA: Insufficient documentation

## 2016-05-10 DIAGNOSIS — Z96653 Presence of artificial knee joint, bilateral: Secondary | ICD-10-CM | POA: Insufficient documentation

## 2016-05-10 DIAGNOSIS — E559 Vitamin D deficiency, unspecified: Secondary | ICD-10-CM

## 2016-05-10 DIAGNOSIS — H409 Unspecified glaucoma: Secondary | ICD-10-CM | POA: Insufficient documentation

## 2016-05-10 DIAGNOSIS — Z96641 Presence of right artificial hip joint: Secondary | ICD-10-CM | POA: Insufficient documentation

## 2016-05-10 DIAGNOSIS — F17200 Nicotine dependence, unspecified, uncomplicated: Secondary | ICD-10-CM

## 2016-05-10 DIAGNOSIS — M19041 Primary osteoarthritis, right hand: Secondary | ICD-10-CM | POA: Insufficient documentation

## 2016-05-10 DIAGNOSIS — M19042 Primary osteoarthritis, left hand: Secondary | ICD-10-CM

## 2016-05-10 HISTORY — DX: Vitamin D deficiency, unspecified: E55.9

## 2016-05-10 NOTE — Progress Notes (Signed)
Office Visit Note  Patient: Caitlin Mccarthy             Date of Birth: 1935/09/26           MRN: [id number]             PCP: [name], FNP Referring: [name], * Visit Date: 05/13/2016 Occupation: Retired Furniture conservator/restorer    Subjective:  Right hip and bilateral knee pain.   History of Present Illness: LILYA Mccarthy is a 80 y.o. right-handed female . She states that she's been having pain in her bilateral knee joints and right hip joint. She is some stiffness in her hands and feet but no joint swelling or discomfort. She states her feet burn at night at times when she is cold.   Activities of Daily Living:  Patient reports morning stiffness for minutes.   Patient Denies nocturnal pain.  Difficulty dressing/grooming: Reports Difficulty climbing stairs: Reports Difficulty getting out of chair: Reports Difficulty using hands for taps, buttons, cutlery, and/or writing: Reports   Review of Systems  Constitutional: Positive for fatigue. Negative for night sweats, weight gain, weight loss and weakness.  HENT: Positive for mouth dryness. Negative for mouth sores, trouble swallowing, trouble swallowing and nose dryness.   Eyes: Positive for dryness. Negative for pain, redness and visual disturbance.  Respiratory: Negative for cough, shortness of breath and difficulty breathing.   Cardiovascular: Negative for chest pain, palpitations, hypertension, irregular heartbeat and swelling in legs/feet.  Gastrointestinal: Negative for blood in stool, constipation and diarrhea.  Endocrine: Negative for increased urination.  Genitourinary: Negative for vaginal dryness.  Musculoskeletal: Positive for arthralgias and joint pain. Negative for joint swelling, myalgias, muscle weakness, morning stiffness, muscle tenderness and myalgias.  Skin: Negative for color change, rash, hair loss, skin tightness, ulcers and sensitivity to sunlight.  Allergic/Immunologic: Negative for  susceptible to infections.  Neurological: Negative for dizziness, memory loss and night sweats.  Hematological: Negative for swollen glands.  Psychiatric/Behavioral: Negative for depressed mood and sleep disturbance. The patient is not nervous/anxious.     PMFS History:  Patient Active Problem List   Diagnosis Date Noted  . Glaucoma 05/10/2016  . Vitamin D deficiency 05/10/2016  . Current smoker 05/10/2016  . Osteoarthritis of both hands 05/10/2016  . History of total right hip replacement 05/10/2016  . History of total knee replacement, bilateral 05/10/2016  . Abnormal immunoelectrophoresis 05/10/2016  . Osteoporosis 05/09/2016  . Type 2 diabetes mellitus ([place]) 02/05/2016  . Obesity (BMI 30-39.9) 12/28/2015  . GAD (generalized anxiety disorder) 12/28/2015  . Hypokalemia 06/02/2015  . Acute encephalopathy 04/13/2015  . Chronic pain 04/13/2015  . Metabolic syndrome [id number]  . Arthritis 11/29/2013  . Essential hypertension, benign 10/02/2012  . Hyperlipidemia with target LDL less than 100 10/02/2012  . GERD (gastroesophageal reflux disease) 10/02/2012  . Depression 10/02/2012  . OA (osteoarthritis) of hip 10/28/2011    Past Medical History:  Diagnosis Date  . Anxiety   . Arthritis   . Current smoker 05/10/2016  . Depression   . Diabetes mellitus without complication ([place])   . Episodic confusion    due to recent UTI  . GERD (gastroesophageal reflux disease)   . Glaucoma 05/10/2016  . History of acute pyelonephritis    last episode 04-13-2015  . History of kidney stones   . History of total knee replacement, bilateral 05/10/2016  . History of total right hip replacement 05/10/2016  . Hyperlipidemia   . Hypertension   . Osteoarthritis of both hands  05/10/2016   severe  . Osteoporosis   . Right ureteral stone   . Sepsis secondary to UTI ([place]) 04/13/2015   Klebsiella bacteremia  . Vitamin D deficiency 05/10/2016  . Wrist fracture, right     Family History   Problem Relation Age of Onset  . Kidney disease Mother   . Diabetes Mother   . Congestive Heart Failure Father   . Heart disease Brother   . Alcohol abuse Brother   . Anesthesia problems Neg Hx   . Hypotension Neg Hx   . Malignant hyperthermia Neg Hx   . Pseudochol deficiency Neg Hx    Past Surgical History:  Procedure Laterality Date  . CATARACT EXTRACTION W/PHACO  06/06/2011   Procedure: CATARACT EXTRACTION PHACO AND INTRAOCULAR LENS PLACEMENT (IOC);  Surgeon: Tonny Branch;  Location: [facility];  Service: Ophthalmology;  Laterality: Right;  CDE=12.77  . CATARACT EXTRACTION W/PHACO  06/27/2011   Procedure: CATARACT EXTRACTION PHACO AND INTRAOCULAR LENS PLACEMENT (IOC);  Surgeon: Tonny Branch;  Location: [facility];  Service: Ophthalmology;  Laterality: Left;  CDE:13.96  . CYSTOSCOPY WITH URETEROSCOPY AND STENT PLACEMENT Right 04/27/2015   Procedure: CYSTOSCOPY WITH RIGHT URETEROSCOPY, BASKET REMOVAL OF STONE, REMOVAL OF RIGHT NEPHROSTOMY TUBE;  Surgeon: Irine Seal, MD;  Location: [facility];  Service: Urology;  Laterality: Right;  . EXTRACORPOREAL SHOCK WAVE LITHOTRIPSY  1980's  . HOLMIUM LASER APPLICATION Right [id number]   Procedure: HOLMIUM LASER APPLICATION;  Surgeon: Irine Seal, MD;  Location: [facility];  Service: Urology;  Laterality: Right;  . KNEE ARTHROSCOPY Bilateral right 2005//  left ?  [name] TOTAL HIP ARTHROPLASTY  10/28/2011   Procedure: TOTAL HIP ARTHROPLASTY;  Surgeon: Gearlean Alf, MD;  Location: [facility];  Service: Orthopedics;  Laterality: Right;  . TOTAL KNEE ARTHROPLASTY Bilateral left 03-09-2007//  right 2006  . TUBAL LIGATION     Social History   Social History Narrative  . No narrative on file     Objective: Vital Signs: BP (!) 141/65 (BP Location: Left Arm, Patient Position: Sitting, Cuff Size: Large)   Pulse 67   Resp 13   Ht 5\' 11"  (1.803 m)   Wt 211 lb (95.7 kg)   BMI 29.43 kg/m    Physical Exam  Constitutional: She is  oriented to person, place, and time. She appears well-developed and well-nourished.  HENT:  Head: Normocephalic and atraumatic.  Eyes: Conjunctivae and EOM are normal.  Neck: Normal range of motion.  Cardiovascular: Normal rate, regular rhythm, normal heart sounds and intact distal pulses.   Pulmonary/Chest: Effort normal and breath sounds normal.  Abdominal: Soft. Bowel sounds are normal.  Lymphadenopathy:    She has no cervical adenopathy.  Neurological: She is alert and oriented to person, place, and time.  Skin: Skin is warm and dry. Capillary refill takes less than 2 seconds.  Psychiatric: She has a normal mood and affect. Her behavior is normal.  Nursing note and vitals reviewed.    Musculoskeletal Exam: C-spine and thoracic spine good range of motion some limitation of range of motion of lumbar spine was noted. She is good range of motion of bilateral shoulders elbows joints wrist joints, MCPs. She has thickening of PIP/DIP joints with no synovitis. Her hip joint range of motion was limited bilaterally. Knee joints both her replaced with mild warmth but no swelling. Ankle joints MTPs PIPs with good range of motion with no swelling.  CDAI Exam: No CDAI exam completed.    Investigation: Findings:  11/06/2015 vitamin D 17, CBC normal, CMP calcium 11.1    Imaging: No results found.  Speciality Comments: No specialty comments available.    Procedures:  No procedures performed Allergies: Patient has no known allergies.   Assessment / Plan: Visit Diagnoses: Primary osteoarthritis of both hands: She has some pain and stiffness but no synovitis.  Primary osteoarthritis of both hips - RTHR: Doing fairly well  History of total knee replacement, bilateral: She is some chronic discomfort  Vitamin D deficiency: She is on supplement I will check her vitamin D levels today and will give future dose based on the levels.  Osteoporosis: Most recent DEXA was reviewed high which is  consistent with osteoporosis. I noted that she's been started on Fosamax by her PCP. Need for regular exercise, muscle strengthening calcium and vitamin D intake was discussed.  She has multiple other medical problems which are listed as follows:  Gastroesophageal reflux disease without esophagitis  Essential hypertension, benign  Hyperlipidemia with target LDL less than 100  Type 2 diabetes mellitus with hyperglycemia, without long-term current use of insulin (HCC)  Obesity (BMI 30-39.9)  Glaucoma, unspecified glaucoma type, unspecified laterality  Current smoker: Smoking cessation was discussed.  Depression, unspecified depression type    Orders: Orders Placed This Encounter  Procedures  . VITAMIN D 25 Hydroxy (Vit-D Deficiency, Fractures)   No orders of the defined types were placed in this encounter.   Face-to-face time spent with patient was 30 minutes. 50% of time was spent in counseling and coordination of care.  Follow-Up Instructions: Return in about 1 year (around 05/13/2017) for Osteoarthritis.   Bo Merino, MD

## 2016-05-13 ENCOUNTER — Ambulatory Visit (INDEPENDENT_AMBULATORY_CARE_PROVIDER_SITE_OTHER): Payer: Commercial Managed Care - HMO | Admitting: Rheumatology

## 2016-05-13 ENCOUNTER — Encounter: Payer: Self-pay | Admitting: Rheumatology

## 2016-05-13 VITALS — BP 141/65 | HR 67 | Resp 13 | Ht 71.0 in | Wt 211.0 lb

## 2016-05-13 DIAGNOSIS — H409 Unspecified glaucoma: Secondary | ICD-10-CM | POA: Diagnosis not present

## 2016-05-13 DIAGNOSIS — F17200 Nicotine dependence, unspecified, uncomplicated: Secondary | ICD-10-CM

## 2016-05-13 DIAGNOSIS — E669 Obesity, unspecified: Secondary | ICD-10-CM

## 2016-05-13 DIAGNOSIS — E785 Hyperlipidemia, unspecified: Secondary | ICD-10-CM | POA: Diagnosis not present

## 2016-05-13 DIAGNOSIS — K219 Gastro-esophageal reflux disease without esophagitis: Secondary | ICD-10-CM

## 2016-05-13 DIAGNOSIS — Z96653 Presence of artificial knee joint, bilateral: Secondary | ICD-10-CM

## 2016-05-13 DIAGNOSIS — R769 Abnormal immunological finding in serum, unspecified: Secondary | ICD-10-CM

## 2016-05-13 DIAGNOSIS — E1165 Type 2 diabetes mellitus with hyperglycemia: Secondary | ICD-10-CM

## 2016-05-13 DIAGNOSIS — I10 Essential (primary) hypertension: Secondary | ICD-10-CM | POA: Diagnosis not present

## 2016-05-13 DIAGNOSIS — M19042 Primary osteoarthritis, left hand: Secondary | ICD-10-CM

## 2016-05-13 DIAGNOSIS — F329 Major depressive disorder, single episode, unspecified: Secondary | ICD-10-CM

## 2016-05-13 DIAGNOSIS — M19041 Primary osteoarthritis, right hand: Secondary | ICD-10-CM

## 2016-05-13 DIAGNOSIS — M160 Bilateral primary osteoarthritis of hip: Secondary | ICD-10-CM

## 2016-05-13 DIAGNOSIS — F32A Depression, unspecified: Secondary | ICD-10-CM

## 2016-05-13 DIAGNOSIS — E559 Vitamin D deficiency, unspecified: Secondary | ICD-10-CM

## 2016-05-14 ENCOUNTER — Telehealth: Payer: Self-pay | Admitting: Radiology

## 2016-05-14 DIAGNOSIS — E559 Vitamin D deficiency, unspecified: Secondary | ICD-10-CM

## 2016-05-14 LAB — VITAMIN D 25 HYDROXY (VIT D DEFICIENCY, FRACTURES): Vit D, 25-Hydroxy: 23 ng/mL — ABNORMAL LOW (ref 30–100)

## 2016-05-14 MED ORDER — VITAMIN D3 1.25 MG (50000 UT) PO CAPS
50000.0000 [IU] | ORAL_CAPSULE | ORAL | 0 refills | Status: AC
Start: 2016-05-14 — End: 2016-08-12

## 2016-05-14 NOTE — Progress Notes (Signed)
Refill Vit D, recheck vitamin D in 3 months

## 2016-05-14 NOTE — Telephone Encounter (Signed)
-----   Message from Bo Merino, MD sent at 05/14/2016 12:37 PM EST ----- Refill Vit D, recheck vitamin D in 3 months

## 2016-05-21 ENCOUNTER — Encounter: Payer: Self-pay | Admitting: Nurse Practitioner

## 2016-05-21 ENCOUNTER — Ambulatory Visit (INDEPENDENT_AMBULATORY_CARE_PROVIDER_SITE_OTHER): Payer: Commercial Managed Care - HMO | Admitting: Nurse Practitioner

## 2016-05-21 VITALS — BP 111/63 | HR 83 | Temp 96.7°F | Ht 71.0 in | Wt 209.0 lb

## 2016-05-21 DIAGNOSIS — R300 Dysuria: Secondary | ICD-10-CM

## 2016-05-21 DIAGNOSIS — N3001 Acute cystitis with hematuria: Secondary | ICD-10-CM | POA: Diagnosis not present

## 2016-05-21 LAB — URINALYSIS
BILIRUBIN UA: NEGATIVE
Glucose, UA: NEGATIVE
Ketones, UA: NEGATIVE
NITRITE UA: POSITIVE — AB
PH UA: 6 (ref 5.0–7.5)
SPEC GRAV UA: 1.02 (ref 1.005–1.030)
Urobilinogen, Ur: 0.2 mg/dL (ref 0.2–1.0)

## 2016-05-21 MED ORDER — CIPROFLOXACIN HCL 500 MG PO TABS: 500.0000 mg | ORAL_TABLET | Freq: Two times a day (BID) | ORAL | 0 refills | Status: DC

## 2016-05-21 NOTE — Patient Instructions (Signed)
Asymptomatic Bacteriuria, Female  Asymptomatic bacteriuria is the presence of a large number of bacteria in your urine without the usual symptoms of burning or frequent urination. The following conditions increase the risk of asymptomatic bacteriuria:   Diabetes mellitus.   Advanced age.   Pregnancy in the first trimester.   Kidney stones.   Kidney transplants.   Leaky kidney tube valve in young children (reflux).  Treatment for this condition is not needed in most people and can lead to other problems such as too much yeast and growth of resistant bacteria. However, some people, such as pregnant women, do need treatment to prevent kidney infection. Asymptomatic bacteriuria in pregnancy is also associated with fetal growth restriction, premature labor, and newborn death.  HOME CARE INSTRUCTIONS  Monitor your condition for any changes. The following actions may help to relieve any discomfort you are feeling:   Drink enough water and fluids to keep your urine clear or pale yellow. Go to the bathroom more often to keep your bladder empty.   Keep the area around your vagina and rectum clean. Wipe yourself from front to back after urinating.  SEEK IMMEDIATE MEDICAL CARE IF:   You develop signs of an infection such as:    Burning with urination.    Frequency of voiding.    Back pain.    Fever.   You have blood in the urine.   You develop a fever.  MAKE SURE YOU:   Understand these instructions.   Will watch your condition.   Will get help right away if you are not doing well or get worse.     This information is not intended to replace advice given to you by your health care provider. Make sure you discuss any questions you have with your health care provider.     Document Released: 06/17/2005 Document Revised: 07/08/2014 Document Reviewed: 12/07/2012  Elsevier Interactive Patient Education 2017 [employer] Inc[employer]

## 2016-05-21 NOTE — Progress Notes (Signed)
   Subjective:    Patient ID: Caitlin Mccarthy, female    DOB: September 29, 1935, 80 y.o.   MRN: [id number]  HPI  Patient comes in today with UTI symptoms, dysuria, frequecy and urgency. SHe has 2 kidney stones also waiting t pass but usually does not have urinary symptoms with that.   Review of Systems  Constitutional: Negative.   HENT: Negative.   Respiratory: Negative.   Cardiovascular: Negative.   Genitourinary: Positive for dysuria, frequency and urgency.  Neurological: Negative.   Psychiatric/Behavioral: Negative.   All other systems reviewed and are negative.      Objective:   Physical Exam  Constitutional: She is oriented to person, place, and time. She appears well-developed and well-nourished. No distress.  HENT:  Right Ear: External ear normal.  Left Ear: External ear normal.  Nose: Nose normal.  Mouth/Throat: Oropharynx is clear and moist.  Neck: Normal range of motion. Neck supple.  Cardiovascular: Normal rate and normal heart sounds.   Pulmonary/Chest: Effort normal and breath sounds normal.  Abdominal: Soft. There is tenderness (mild suprapubic tenderness).  Genitourinary:  Genitourinary Comments: NO CVA tenderness  Neurological: She is alert and oriented to person, place, and time.  Skin: Skin is warm.  Psychiatric: She has a normal mood and affect. Her behavior is normal. Judgment and thought content normal.   BP 111/63   Pulse 83   Temp (!) 96.7 F (35.9 C) (Oral)   Ht 5\' 11"  (1.803 m)   Wt 209 lb (94.8 kg)   BMI 29.15 kg/m         Assessment & Plan:  1. Dysuria - Urinalysis - Urine culture  2. Acute cystitis with hematuria Take medication as prescribe Cotton underwear Take shower not bath Cranberry juice, yogurt Force fluids AZO over the counter X2 days Culture pending RTO prn - ciprofloxacin (CIPRO) 500 MG tablet; Take 1 tablet (500 mg total) by mouth 2 (two) times daily.  Dispense: 20 tablet; Refill: 0 - Urine culture  Mary-Margaret  Hassell Done, FNP

## 2016-05-24 LAB — URINE CULTURE

## 2016-05-27 ENCOUNTER — Other Ambulatory Visit: Payer: Self-pay | Admitting: Nurse Practitioner

## 2016-05-27 MED ORDER — SULFAMETHOXAZOLE-TRIMETHOPRIM 800-160 MG PO TABS: 1.0000 | ORAL_TABLET | Freq: Two times a day (BID) | ORAL | 0 refills | Status: DC

## 2016-06-05 ENCOUNTER — Ambulatory Visit (INDEPENDENT_AMBULATORY_CARE_PROVIDER_SITE_OTHER): Payer: Commercial Managed Care - HMO | Admitting: Physician Assistant

## 2016-06-05 ENCOUNTER — Encounter: Payer: Self-pay | Admitting: Physician Assistant

## 2016-06-05 VITALS — BP 123/68 | HR 76 | Temp 96.8°F | Ht 71.0 in | Wt 208.0 lb

## 2016-06-05 DIAGNOSIS — E119 Type 2 diabetes mellitus without complications: Secondary | ICD-10-CM

## 2016-06-05 DIAGNOSIS — F331 Major depressive disorder, recurrent, moderate: Secondary | ICD-10-CM | POA: Diagnosis not present

## 2016-06-05 DIAGNOSIS — N3000 Acute cystitis without hematuria: Secondary | ICD-10-CM | POA: Diagnosis not present

## 2016-06-05 LAB — URINALYSIS, COMPLETE
BILIRUBIN UA: NEGATIVE
Glucose, UA: NEGATIVE
Ketones, UA: NEGATIVE
Nitrite, UA: NEGATIVE
PH UA: 5.5 (ref 5.0–7.5)
Protein, UA: NEGATIVE
Specific Gravity, UA: 1.025 (ref 1.005–1.030)
UUROB: 0.2 mg/dL (ref 0.2–1.0)

## 2016-06-05 LAB — MICROSCOPIC EXAMINATION
Epithelial Cells (non renal): >10 /HPF — AB
Renal Epithel, UA: NONE SEEN /HPF
WBC, UA: >30 /HPF — AB

## 2016-06-05 MED ORDER — ALPRAZOLAM 0.25 MG PO TABS: 0.2500 mg | ORAL_TABLET | Freq: Two times a day (BID) | ORAL | 0 refills | Status: DC | PRN

## 2016-06-05 MED ORDER — SULFAMETHOXAZOLE-TRIMETHOPRIM 800-160 MG PO TABS: 1.0000 | ORAL_TABLET | Freq: Two times a day (BID) | ORAL | 0 refills | Status: DC

## 2016-06-05 MED ORDER — SERTRALINE HCL 50 MG PO TABS: 50.0000 mg | ORAL_TABLET | Freq: Every day | ORAL | 3 refills | Status: DC

## 2016-06-05 NOTE — Progress Notes (Signed)
BP 123/68   Pulse 76   Temp (!) 96.8 F (36 C) (Oral)   Ht _0  (1.803 m)   Wt 208 lb (94.3 kg)   BMI 29.01 kg/m    Subjective:    Patient ID: Caitlin Mccarthy, female    DOB: 1936-03-28, 80 y.o.   MRN: [id number]  HPI: Caitlin Mccarthy is a 80 y.o. female presenting on 06/05/2016 for Nausea; dry mouth; and Urinary Tract Infection (Just finished bactrim and would like urine checked again)  Has urology appointment in a few weeks. She has known frequent urinary tract infections. She was just recently treated for 1 and the culture was positive for Klebsiella. It was sensitive to Bactrim. She took the 7 days completely. Urinalysis today is again positive we'll plan to send this culture off. We will go ahead and start her with Bactrim for 10 days this time. The patient also reports a significant amount of depressive symptoms. She does not feel like going out in public and being around her friends. She normally enjoys that a lot. She is staying in her house more. She is not taking her Lexapro at this time. She reports she has taken Zoloft in the past without difficulty. We'll plan to start that today.  Relevant past medical, surgical, family and social history reviewed and updated as indicated. Allergies and medications reviewed and updated.  Past Medical History:  Diagnosis Date  . Anxiety   . Arthritis   . Current smoker 05/10/2016  . Depression   . Diabetes mellitus without complication ([place])   . Episodic confusion    due to recent UTI  . GERD (gastroesophageal reflux disease)   . Glaucoma 05/10/2016  . History of acute pyelonephritis    last episode 04-13-2015  . History of kidney stones   . History of total knee replacement, bilateral 05/10/2016  . History of total right hip replacement 05/10/2016  . Hyperlipidemia   . Hypertension   . Osteoarthritis of both hands 05/10/2016   severe  . Osteoporosis   . Right ureteral stone   . Sepsis secondary to UTI ([place]) 04/13/2015     Klebsiella bacteremia  . Vitamin D deficiency 05/10/2016  . Wrist fracture, right     Past Surgical History:  Procedure Laterality Date  . CATARACT EXTRACTION W/PHACO  06/06/2011   Procedure: CATARACT EXTRACTION PHACO AND INTRAOCULAR LENS PLACEMENT (IOC);  Surgeon: Tonny Branch;  Location: [facility];  Service: Ophthalmology;  Laterality: Right;  CDE=12.77  . CATARACT EXTRACTION W/PHACO  06/27/2011   Procedure: CATARACT EXTRACTION PHACO AND INTRAOCULAR LENS PLACEMENT (IOC);  Surgeon: Tonny Branch;  Location: [facility];  Service: Ophthalmology;  Laterality: Left;  CDE:13.96  . CYSTOSCOPY WITH URETEROSCOPY AND STENT PLACEMENT Right 04/27/2015   Procedure: CYSTOSCOPY WITH RIGHT URETEROSCOPY, BASKET REMOVAL OF STONE, REMOVAL OF RIGHT NEPHROSTOMY TUBE;  Surgeon: Irine Seal, MD;  Location: [place];  Service: Urology;  Laterality: Right;  . EXTRACORPOREAL SHOCK WAVE LITHOTRIPSY  1980's  . HOLMIUM LASER APPLICATION Right 59/74/1638   Procedure: HOLMIUM LASER APPLICATION;  Surgeon: Irine Seal, MD;  Location: [facility];  Service: Urology;  Laterality: Right;  . KNEE ARTHROSCOPY Bilateral right 2005//  left ?  [name] TOTAL HIP ARTHROPLASTY  10/28/2011   Procedure: TOTAL HIP ARTHROPLASTY;  Surgeon: Gearlean Alf, MD;  Location[facility];  Service: Orthopedics;  Laterality: Right;  . TOTAL KNEE ARTHROPLASTY Bilateral left 03-09-2007//  right 2006  . TUBAL LIGATION  Review of Systems  Constitutional: Negative.  Negative for unexpected weight change.  HENT: Negative.   Eyes: Negative.   Respiratory: Negative.   Gastrointestinal: Negative.   Genitourinary: Positive for difficulty urinating, frequency and urgency. Negative for dysuria, enuresis, flank pain and pelvic pain.  Psychiatric/Behavioral: Positive for dysphoric mood and sleep disturbance. Negative for suicidal ideas. The patient is nervous/anxious.       Medication List       Accurate as of 06/05/16 11:16 AM.  Always use your most recent med list.          alendronate 70 MG tablet Commonly known as:  FOSAMAX Take 1 tablet (70 mg total) by mouth every 7 (seven) days. Take with a full glass of water on an empty stomach.   ALPRAZolam 0.25 MG tablet Commonly known as:  XANAX Take 1 tablet (0.25 mg total) by mouth 2 (two) times daily as needed for anxiety.   amLODipine-benazepril 10-40 MG capsule Commonly known as:  LOTREL TAKE ONE CAPSULE EACH MORNING   blood glucose meter kit and supplies Kit Dispense based on patient and insurance preference. Use up to twice a day.  Dx: type 2 DM ICD-10  E11.9   calcium-vitamin D 500-200 MG-UNIT tablet Commonly known as:  OSCAL WITH D Take 1 tablet by mouth daily.   cyclobenzaprine 5 MG tablet Commonly known as:  FLEXERIL TAKE ONE TABLET BY MOUTH THREE TIMES DAILY AS NEEDED FOR MUSCLE SPASM   fenofibrate 145 MG tablet Commonly known as:  TRICOR Take 1 tablet (145 mg total) by mouth daily.   hydrochlorothiazide 25 MG tablet Commonly known as:  HYDRODIURIL TAKE ONE TABLET EVERY MORNING   metFORMIN 500 MG tablet Commonly known as:  GLUCOPHAGE Take 500 mg by mouth 2 (two) times daily with a meal.   rosuvastatin 20 MG tablet Commonly known as:  CRESTOR Take 1 tablet (20 mg total) by mouth at bedtime.   sertraline 50 MG tablet Commonly known as:  ZOLOFT Take 1 tablet (50 mg total) by mouth daily.   sulfamethoxazole-trimethoprim 800-160 MG tablet Commonly known as:  BACTRIM DS Take 1 tablet by mouth 2 (two) times daily.   traMADol 50 MG tablet Commonly known as:  ULTRAM Take 1-2 tablets (50-100 mg total) by mouth 3 (three) times daily.   trimethoprim 100 MG tablet Commonly known as:  TRIMPEX Take 100 mg by mouth daily.   Vitamin D3 50000 units Caps Take 50,000 Units by mouth once a week.          Objective:    BP 123/68   Pulse 76   Temp (!) 96.8 F (36 C) (Oral)   Ht _0  (1.803 m)   Wt 208 lb (94.3 kg)   BMI 29.01 kg/m    No Known Allergies  Physical Exam  Constitutional: She is oriented to person, place, and time. She appears well-developed and well-nourished.  HENT:  Head: Normocephalic and atraumatic.  Eyes: Conjunctivae are normal. Pupils are equal, round, and reactive to light.  Cardiovascular: Normal rate, regular rhythm, normal heart sounds and intact distal pulses.   Pulmonary/Chest: Effort normal and breath sounds normal.  Abdominal: Soft. Bowel sounds are normal. She exhibits no distension and no mass. There is tenderness in the suprapubic area. There is no rebound, no guarding and no CVA tenderness.  Neurological: She is alert and oriented to person, place, and time. She has normal reflexes.  Skin: Skin is warm and dry. No rash noted.  Psychiatric: She has a  normal mood and affect. Her behavior is normal. Judgment and thought content normal.        Assessment & Plan:   1. Acute cystitis without hematuria - trimethoprim (TRIMPEX) 100 MG tablet; Take 100 mg by mouth daily.  - Urinalysis, Complete - Urine culture - sulfamethoxazole-trimethoprim (BACTRIM DS) 800-160 MG tablet; Take 1 tablet by mouth 2 (two) times daily.  Dispense: 20 tablet; Refill: 0  2. Type 2 diabetes mellitus without complication, without long-term current use of insulin (HCC) - metFORMIN (GLUCOPHAGE) 500 MG tablet; Take 500 mg by mouth 2 (two) times daily with a meal.  - CMP14+EGFR  3. Moderate episode of recurrent major depressive disorder (HCC) - sertraline (ZOLOFT) 50 MG tablet; Take 1 tablet (50 mg total) by mouth daily.  Dispense: 30 tablet; Refill: 3 - ALPRAZolam (XANAX) 0.25 MG tablet; Take 1 tablet (0.25 mg total) by mouth 2 (two) times daily as needed for anxiety.  Dispense: 20 tablet; Refill: 0   Continue all other maintenance medications as listed above.  Follow up plan: Return if symptoms worsen or fail to improve, for keep follow up.  Orders Placed This Encounter  Procedures  . Urine culture  .  CMP14+EGFR  . Urinalysis, Complete    Educational handout given for UTI  Terald Sleeper PA-C [facility] [address]  [place], [place] [address] [phone]   06/05/2016, 11:16 AM

## 2016-06-05 NOTE — Patient Instructions (Signed)
Urinary Tract Infection, Adult Introduction A urinary tract infection (UTI) is an infection of any part of the urinary tract. The urinary tract includes the:  Kidneys.  Ureters.  Bladder.  Urethra. These organs make, store, and get rid of pee (urine) in the body. Follow these instructions at home:  Take over-the-counter and prescription medicines only as told by your doctor.  If you were prescribed an antibiotic medicine, take it as told by your doctor. Do not stop taking the antibiotic even if you start to feel better.  Avoid the following drinks:  Alcohol.  Caffeine.  Tea.  Carbonated drinks.  Drink enough fluid to keep your pee clear or pale yellow.  Keep all follow-up visits as told by your doctor. This is important.  Make sure to:  Empty your bladder often and completely. Do not to hold pee for long periods of time.  Empty your bladder before and after sex.  Wipe from front to back after a bowel movement if you are female. Use each tissue one time when you wipe. Contact a doctor if:  You have back pain.  You have a fever.  You feel sick to your stomach (nauseous).  You throw up (vomit).  Your symptoms do not get better after 3 days.  Your symptoms go away and then come back. Get help right away if:  You have very bad back pain.  You have very bad lower belly (abdominal) pain.  You are throwing up and cannot keep down any medicines or water. This information is not intended to replace advice given to you by your health care provider. Make sure you discuss any questions you have with your health care provider. Document Released: 12/04/2007 Document Revised: 11/23/2015 Document Reviewed: 05/08/2015  2017 Elsevier  

## 2016-06-06 LAB — CMP14+EGFR
A/G RATIO: 1.4 (ref 1.2–2.2)
ALBUMIN: 4.4 g/dL (ref 3.5–4.8)
ALK PHOS: 55 IU/L (ref 39–117)
ALT: 29 IU/L (ref 0–32)
AST: 29 IU/L (ref 0–40)
BILIRUBIN TOTAL: 0.4 mg/dL (ref 0.0–1.2)
BUN / CREAT RATIO: 22 (ref 12–28)
BUN: 24 mg/dL (ref 8–27)
CHLORIDE: 98 mmol/L (ref 96–106)
CO2: 24 mmol/L (ref 18–29)
Calcium: 11.5 mg/dL — ABNORMAL HIGH (ref 8.7–10.3)
Creatinine, Ser: 1.11 mg/dL — ABNORMAL HIGH (ref 0.57–1.00)
GFR calc non Af Amer: 48 mL/min/{1.73_m2} — ABNORMAL LOW (ref >59)
GFR, EST AFRICAN AMERICAN: 55 mL/min/{1.73_m2} — AB (ref >59)
GLOBULIN, TOTAL: 3.2 g/dL (ref 1.5–4.5)
GLUCOSE: 118 mg/dL — AB (ref 65–99)
POTASSIUM: 5.1 mmol/L (ref 3.5–5.2)
SODIUM: 135 mmol/L (ref 134–144)
TOTAL PROTEIN: 7.6 g/dL (ref 6.0–8.5)

## 2016-06-06 LAB — URINE CULTURE

## 2016-06-19 DIAGNOSIS — N200 Calculus of kidney: Secondary | ICD-10-CM | POA: Diagnosis not present

## 2016-06-21 ENCOUNTER — Telehealth: Payer: Self-pay | Admitting: Nurse Practitioner

## 2016-06-21 NOTE — Telephone Encounter (Signed)
Patients daughter states that she has been nauseated for 1 month. Patients daughter advised that she should be seen and appointment scheduled for Tuesday with Ronnald Collum, FNP.

## 2016-06-25 ENCOUNTER — Encounter: Payer: Self-pay | Admitting: Nurse Practitioner

## 2016-06-25 ENCOUNTER — Ambulatory Visit (INDEPENDENT_AMBULATORY_CARE_PROVIDER_SITE_OTHER): Payer: Commercial Managed Care - HMO | Admitting: Nurse Practitioner

## 2016-06-25 VITALS — BP 139/67 | HR 76 | Temp 98.6°F | Ht 71.0 in | Wt 214.0 lb

## 2016-06-25 DIAGNOSIS — E8881 Metabolic syndrome: Secondary | ICD-10-CM | POA: Diagnosis not present

## 2016-06-25 DIAGNOSIS — R110 Nausea: Secondary | ICD-10-CM | POA: Diagnosis not present

## 2016-06-25 DIAGNOSIS — E1165 Type 2 diabetes mellitus with hyperglycemia: Secondary | ICD-10-CM | POA: Diagnosis not present

## 2016-06-25 LAB — BAYER DCA HB A1C WAIVED: HB A1C: 6.6 % (ref <7.0)

## 2016-06-25 NOTE — Patient Instructions (Signed)
Nausea, Adult  Nausea is the feeling of an upset stomach or having to vomit. Nausea on its own is not usually a serious concern, but it may be an early sign of a more serious medical problem. As nausea gets worse, it can lead to vomiting. If vomiting develops, or if you are not able to drink enough fluids, you are at risk of becoming dehydrated. Dehydration can make you tired and thirsty, cause you to have a dry mouth, and decrease how often you urinate. Older adults and people with other diseases or a weak immune system are at higher risk for dehydration. The main goals of treating your nausea are:  · To limit repeated nausea episodes.  · To prevent vomiting and dehydration.    Follow these instructions at home:  Follow instructions from your health care provider about how to care for yourself at home.  Eating and drinking   Follow these recommendations as told by your health care provider:  · Take an oral rehydration solution (ORS). This is a drink that is sold at pharmacies and retail stores.  · Drink clear fluids in small amounts as you are able. Clear fluids include water, ice chips, diluted fruit juice, and low-calorie sports drinks.  · Eat bland, easy-to-digest foods in small amounts as you are able. These foods include bananas, applesauce, rice, lean meats, toast, and crackers.  · Avoid drinking fluids that contain a lot of sugar or caffeine, such as energy drinks, sports drinks, and soda.  · Avoid alcohol.  · Avoid spicy or fatty foods.    General instructions   · Drink enough fluid to keep your urine clear or pale yellow.  · Wash your hands often. If soap and water are not available, use hand sanitizer.  · Make sure that all people in your household wash their hands well and often.  · Rest at home while you recover.  · Take over-the-counter and prescription medicines only as told by your health care provider.  · Breathe slowly and deeply when you feel nauseous.  · Watch your condition for any  changes.  · Keep all follow-up visits as told by your health care provider. This is important.  Contact a health care provider if:  · You have a headache.  · You have new symptoms.  · Your nausea gets worse.  · You have a fever.  · You feel light-headed or dizzy.  · You vomit.  · You cannot keep fluids down.  Get help right away if:  · You have pain in your chest, neck, arm, or jaw.  · You feel extremely weak or you faint.  · You have vomit that is bright red or looks like coffee grounds.  · You have bloody or black stools or stools that look like tar.  · You have a severe headache, a stiff neck, or both.  · You have severe pain, cramping, or bloating in your abdomen.  · You have a rash.  · You have difficulty breathing or are breathing very quickly.  · Your heart is beating very quickly.  · Your skin feels cold and clammy.  · You feel confused.  · You have pain when you urinate.  · You have signs of dehydration, such as:  ? Dark urine, very little, or no urine.  ? Cracked lips.  ? Dry mouth.  ? Sunken eyes.  ? Sleepiness.  ? Weakness.  These symptoms may represent a serious problem that is an emergency. Do   not wait to see if the symptoms will go away. Get medical help right away. Call your local emergency services (911 in the [place] Do not drive yourself to the hospital.  This information is not intended to replace advice given to you by your health care provider. Make sure you discuss any questions you have with your health care provider.  Document Released: 07/25/2004 Document Revised: 11/20/2015 Document Reviewed: 02/21/2015  Elsevier Interactive Patient Education © 2017 [employer]

## 2016-06-25 NOTE — Progress Notes (Signed)
   Subjective:    Patient ID: Caitlin Mccarthy, female    DOB: 1936-01-15, 80 y.o.   MRN: [id number]  HPI Patient comes in today c/o nausea. Says she feels like she is "car sick". Happens almost everyday. Is intermittent. Lots of belching. Can't really tell is it is diet related. No change in her weight  She does not think. She stopped her metformin due to diarrhea and she is not sure if nausea medication related.   Review of Systems  Constitutional: Negative.   HENT: Negative.   Respiratory: Negative.   Cardiovascular: Negative.   Gastrointestinal: Positive for nausea.  Genitourinary: Negative.   Neurological: Negative.   Psychiatric/Behavioral: Negative.   All other systems reviewed and are negative.      Objective:   Physical Exam  Constitutional: She is oriented to person, place, and time. She appears well-developed and well-nourished.  Cardiovascular: Normal rate and regular rhythm.   Pulmonary/Chest: Effort normal.  Abdominal: Soft. Bowel sounds are normal. She exhibits no distension and no mass. There is no tenderness. There is no guarding.  Neurological: She is alert and oriented to person, place, and time.  Skin: Skin is warm.  Psychiatric: She has a normal mood and affect. Her behavior is normal. Judgment and thought content normal.    BP 139/67   Pulse 76   Temp 98.6 F (37 C) (Oral)   Ht _0  (1.803 m)   Wt 214 lb (97.1 kg)   BMI 29.85 kg/m   >hgba1c- 6.6% today     Assessment & Plan:  1. Metabolic syndrome Watch carbs in diet - Bayer DCA Hb A1c Waived - CMP14+EGFR  2. Nausea waiting on labs I f hpyloir is negative will GI referral - H Pylori, IGM, IGG, IGA AB    Mary-Margaret Hassell Done, FNP

## 2016-06-26 ENCOUNTER — Other Ambulatory Visit: Payer: Self-pay | Admitting: Nurse Practitioner

## 2016-06-26 DIAGNOSIS — I10 Essential (primary) hypertension: Secondary | ICD-10-CM

## 2016-06-27 ENCOUNTER — Other Ambulatory Visit: Payer: Self-pay | Admitting: Nurse Practitioner

## 2016-06-27 LAB — CMP14+EGFR
A/G RATIO: 1.3 (ref 1.2–2.2)
ALK PHOS: 54 IU/L (ref 39–117)
ALT: 25 IU/L (ref 0–32)
AST: 25 IU/L (ref 0–40)
Albumin: 4.4 g/dL (ref 3.5–4.8)
BILIRUBIN TOTAL: 0.3 mg/dL (ref 0.0–1.2)
BUN/Creatinine Ratio: 20 (ref 12–28)
BUN: 18 mg/dL (ref 8–27)
CO2: 24 mmol/L (ref 18–29)
Calcium: 11.4 mg/dL — ABNORMAL HIGH (ref 8.7–10.3)
Chloride: 97 mmol/L (ref 96–106)
Creatinine, Ser: 0.89 mg/dL (ref 0.57–1.00)
GFR calc Af Amer: 72 mL/min/{1.73_m2} (ref >59)
GFR calc non Af Amer: 62 mL/min/{1.73_m2} (ref >59)
Globulin, Total: 3.5 g/dL (ref 1.5–4.5)
Glucose: 188 mg/dL — ABNORMAL HIGH (ref 65–99)
Potassium: 4.4 mmol/L (ref 3.5–5.2)
SODIUM: 136 mmol/L (ref 134–144)
Total Protein: 7.9 g/dL (ref 6.0–8.5)

## 2016-06-27 LAB — H PYLORI, IGM, IGG, IGA AB
H PYLORI IGG: 2 U/mL — AB (ref 0.0–0.8)
H pylori, IgM Abs: <9 units (ref 0.0–8.9)

## 2016-06-27 MED ORDER — AMOXICILLIN 500 MG PO CAPS: 500.0000 mg | ORAL_CAPSULE | Freq: Two times a day (BID) | ORAL | 0 refills | Status: DC

## 2016-06-27 MED ORDER — LANSOPRAZOLE 30 MG PO CPDR: 30.0000 mg | DELAYED_RELEASE_CAPSULE | Freq: Two times a day (BID) | ORAL | 0 refills | Status: DC

## 2016-06-27 MED ORDER — CLARITHROMYCIN 500 MG PO TABS: 500.0000 mg | ORAL_TABLET | Freq: Two times a day (BID) | ORAL | 0 refills | Status: DC

## 2016-06-27 NOTE — Progress Notes (Signed)
prevaci

## 2016-06-28 ENCOUNTER — Ambulatory Visit: Payer: Commercial Managed Care - HMO | Admitting: Nurse Practitioner

## 2016-07-08 ENCOUNTER — Telehealth: Payer: Self-pay | Admitting: Family

## 2016-07-08 MED ORDER — FLUCONAZOLE 150 MG PO TABS: ORAL_TABLET | ORAL | 0 refills | Status: DC

## 2016-07-08 NOTE — Telephone Encounter (Signed)
Pt has been on antibiotics on and off over the past month or so and is now c/o vaginal itching and says she has a yeast infection and has had them in the past. She has taken Diflucan in the past and requested rx for this be sent to the pharmacy. Rx sent to the pharmacy per [facility] and pt is aware if symptoms persist or worsen she will ntbs.

## 2016-07-09 ENCOUNTER — Ambulatory Visit: Payer: Commercial Managed Care - HMO | Admitting: Family

## 2016-07-12 ENCOUNTER — Encounter: Payer: Self-pay | Admitting: Family

## 2016-07-12 ENCOUNTER — Ambulatory Visit (INDEPENDENT_AMBULATORY_CARE_PROVIDER_SITE_OTHER): Payer: Medicare HMO | Admitting: Family

## 2016-07-12 VITALS — BP 97/61 | HR 78 | Temp 97.0°F | Ht 71.0 in | Wt 208.0 lb

## 2016-07-12 DIAGNOSIS — E785 Hyperlipidemia, unspecified: Secondary | ICD-10-CM | POA: Diagnosis not present

## 2016-07-12 DIAGNOSIS — M160 Bilateral primary osteoarthritis of hip: Secondary | ICD-10-CM

## 2016-07-12 DIAGNOSIS — E559 Vitamin D deficiency, unspecified: Secondary | ICD-10-CM

## 2016-07-12 DIAGNOSIS — M1990 Unspecified osteoarthritis, unspecified site: Secondary | ICD-10-CM

## 2016-07-12 DIAGNOSIS — E1165 Type 2 diabetes mellitus with hyperglycemia: Secondary | ICD-10-CM

## 2016-07-12 DIAGNOSIS — M19042 Primary osteoarthritis, left hand: Secondary | ICD-10-CM | POA: Diagnosis not present

## 2016-07-12 DIAGNOSIS — I10 Essential (primary) hypertension: Secondary | ICD-10-CM

## 2016-07-12 DIAGNOSIS — K219 Gastro-esophageal reflux disease without esophagitis: Secondary | ICD-10-CM

## 2016-07-12 DIAGNOSIS — M19041 Primary osteoarthritis, right hand: Secondary | ICD-10-CM | POA: Diagnosis not present

## 2016-07-12 DIAGNOSIS — E669 Obesity, unspecified: Secondary | ICD-10-CM

## 2016-07-12 DIAGNOSIS — F339 Major depressive disorder, recurrent, unspecified: Secondary | ICD-10-CM | POA: Diagnosis not present

## 2016-07-12 DIAGNOSIS — E8881 Metabolic syndrome: Secondary | ICD-10-CM | POA: Diagnosis not present

## 2016-07-12 DIAGNOSIS — F331 Major depressive disorder, recurrent, moderate: Secondary | ICD-10-CM

## 2016-07-12 DIAGNOSIS — F411 Generalized anxiety disorder: Secondary | ICD-10-CM | POA: Diagnosis not present

## 2016-07-12 DIAGNOSIS — E876 Hypokalemia: Secondary | ICD-10-CM

## 2016-07-12 DIAGNOSIS — F17200 Nicotine dependence, unspecified, uncomplicated: Secondary | ICD-10-CM | POA: Diagnosis not present

## 2016-07-12 DIAGNOSIS — M810 Age-related osteoporosis without current pathological fracture: Secondary | ICD-10-CM

## 2016-07-12 LAB — BAYER DCA HB A1C WAIVED: HB A1C: 7 % — AB (ref <7.0)

## 2016-07-12 MED ORDER — TRAMADOL HCL 50 MG PO TABS: 50.0000 mg | ORAL_TABLET | Freq: Three times a day (TID) | ORAL | 2 refills | Status: DC

## 2016-07-12 MED ORDER — ALPRAZOLAM 0.25 MG PO TABS: 0.2500 mg | ORAL_TABLET | Freq: Two times a day (BID) | ORAL | 0 refills | Status: DC | PRN

## 2016-07-12 NOTE — Progress Notes (Signed)
Subjective:    Patient ID: Caitlin Mccarthy, female    DOB: Jan 02, 1936, 81 y.o.   MRN: [id number]  Patient here today for follow up of chronic medical problems.  Hypertension  This is a chronic problem. The current episode started more than 1 year ago. The problem has been resolved (hypotensive today) since onset. The problem is controlled. Associated symptoms include anxiety. Pertinent negatives include no blurred vision, chest pain, palpitations or peripheral edema. Risk factors for coronary artery disease include dyslipidemia, family history, obesity, smoking/tobacco exposure, post-menopausal state and sedentary lifestyle. Past treatments include calcium channel blockers, ACE inhibitors and diuretics. The current treatment provides moderate improvement. There is no history of kidney disease, CAD/MI, CVA, heart failure or a thyroid problem.  Diabetes  She presents for her follow-up diabetic visit. She has type 2 diabetes mellitus. Hypoglycemia symptoms include nervousness/anxiousness. Associated symptoms include foot paresthesias ("at times"). Pertinent negatives for diabetes include no blurred vision, no chest pain, no polydipsia, no polyphagia and no polyuria. There are no hypoglycemic complications. Diabetic complications include peripheral neuropathy. Pertinent negatives for diabetic complications include no CVA, heart disease or nephropathy. Risk factors for coronary artery disease include diabetes mellitus, dyslipidemia, obesity, hypertension, sedentary lifestyle and post-menopausal. Current diabetic treatment includes oral agent (monotherapy). She is compliant with treatment all of the time. Her weight is stable. She is following a diabetic diet. (Doesn't check her BS ) An ACE inhibitor/angiotensin II receptor blocker is being taken. Eye exam is current (12/2015).  Hyperlipidemia  This is a chronic problem. The current episode started more than 1 year ago. The problem is controlled. Recent  lipid tests were reviewed and are normal. Exacerbating diseases include obesity. She has no history of diabetes or hypothyroidism. Factors aggravating her hyperlipidemia include smoking. Pertinent negatives include no chest pain or myalgias. Current antihyperlipidemic treatment includes statins. The current treatment provides moderate improvement of lipids. Compliance problems include adherence to diet and adherence to exercise.  Risk factors for coronary artery disease include dyslipidemia, hypertension, obesity, post-menopausal and a sedentary lifestyle.  Gastroesophageal Reflux  She reports no belching, no chest pain, no heartburn, no nausea or no water brash. This is a chronic problem. The current episode started more than 1 year ago. The problem occurs occasionally. The problem has been waxing and waning. The heartburn does not wake her from sleep. The symptoms are aggravated by certain foods and lying down. Risk factors include obesity. She has tried a PPI for the symptoms. The treatment provided moderate relief.  Arthritis  Presents for follow-up visit. The disease course has been fluctuating. She complains of pain, joint swelling and joint warmth. Affected locations include the right knee, left knee and right hip (right hip). Her pain is at a severity of 9/10. Pertinent negatives include no dry mouth, dysuria, pain at night, pain while resting or uveitis. Her past medical history is significant for osteoarthritis. Past treatments include rest and NSAIDs. The treatment provided mild relief.  Anxiety  Presents for initial visit. Onset was more than 5 years ago. The problem has been waxing and waning. Symptoms include excessive worry, irritability, nervous/anxious behavior and restlessness. Patient reports no chest pain, dry mouth, nausea or palpitations. Symptoms occur most days. The symptoms are aggravated by family issues. The quality of sleep is good.   Her past medical history is significant for  anxiety/panic attacks.  Metabolic syndrome Does not check blood sugars at home. Tries to watch carb intake Osteoporosis  PT currently taking Fosamax 70 mg weekly and  calcium and Vit D. PT's last Dexa Scan was 01/31/16.   Review of Systems  Constitutional: Positive for irritability.  HENT: Negative.   Eyes: Negative.  Negative for blurred vision.  Cardiovascular: Negative.  Negative for chest pain, palpitations and leg swelling.  Gastrointestinal: Negative for heartburn and nausea.  Endocrine: Negative for polydipsia, polyphagia and polyuria.  Genitourinary: Negative.  Negative for dysuria.       Denies urinary incontinence  Musculoskeletal: Positive for arthralgias, arthritis, gait problem and joint swelling. Negative for myalgias.  Psychiatric/Behavioral: The patient is nervous/anxious.   All other systems reviewed and are negative.      Objective:   Physical Exam  Constitutional: She is oriented to person, place, and time. She appears well-developed and well-nourished.  HENT:  Nose: Nose normal.  Mouth/Throat: Oropharynx is clear and moist.  Eyes: EOM are normal.  Neck: Trachea normal, normal range of motion and full passive range of motion without pain. Neck supple. No JVD present. Carotid bruit is not present. No thyromegaly present.  Cardiovascular: Normal rate, regular rhythm, normal heart sounds and intact distal pulses.  Exam reveals no gallop and no friction rub.   No murmur heard. Pulmonary/Chest: Effort normal and breath sounds normal. No respiratory distress.  Abdominal: Soft. Bowel sounds are normal. She exhibits no distension and no mass. There is no tenderness.  Musculoskeletal: She exhibits tenderness. She exhibits no edema.  Pt walking with cane and slow movements with gait related to pain  Lymphadenopathy:    She has no cervical adenopathy.  Neurological: She is alert and oriented to person, place, and time.  Skin: Skin is warm and dry. No rash noted.   Psychiatric: She has a normal mood and affect. Her behavior is normal. Judgment and thought content normal.   BP 97/61   Pulse 78   Temp 97 F (36.1 C) (Oral)   Ht _0  (1.803 m)   Wt 208 lb (94.3 kg)   BMI 29.01 kg/m        Assessment & Plan:  1. Metabolic syndrome - YQI34+VQQV  2. Hyperlipidemia with target LDL less than 100 - CMP14+EGFR - Lipid panel  3. Hypokalemia - CMP14+EGFR  4. Age-related osteoporosis without current pathological fracture - CMP14+EGFR  5. Primary osteoarthritis of both hands - CMP14+EGFR  6. Arthritis - CMP14+EGFR - traMADol (ULTRAM) 50 MG tablet; Take 1-2 tablets (50-100 mg total) by mouth 3 (three) times daily.  Dispense: 120 tablet; Refill: 2  7. Obesity (BMI 30-39.9) - CMP14+EGFR  8. Vitamin D deficiency - CMP14+EGFR  9. GAD (generalized anxiety disorder) - CMP14+EGFR  10. Episode of recurrent major depressive disorder, unspecified depression episode severity ([place]) - CMP14+EGFR  11. Current smoker - CMP14+EGFR  12. Moderate episode of recurrent major depressive disorder (HCC) - CMP14+EGFR - ALPRAZolam (XANAX) 0.25 MG tablet; Take 1 tablet (0.25 mg total) by mouth 2 (two) times daily as needed for anxiety.  Dispense: 20 tablet; Refill: 0  13. Primary osteoarthritis of both hips - CMP14+EGFR - traMADol (ULTRAM) 50 MG tablet; Take 1-2 tablets (50-100 mg total) by mouth 3 (three) times daily.  Dispense: 120 tablet; Refill: 2  14. Type 2 diabetes mellitus with hyperglycemia, without long-term current use of insulin (HCC) - Bayer DCA Hb A1c Waived - CMP14+EGFR - Microalbumin / creatinine urine ratio  15. Essential hypertension, benign -HCTZ 25 mg D/C today RTO in 2 weeks - CMP14+EGFR  16. Gastroesophageal reflux disease without esophagitis - CMP14+EGFR   Continue all meds Labs pending Health  Maintenance reviewed Diet and exercise encouraged RTO 2 weeks to recheck hypotension, HCTZ 25 mg D/C today  Evelina Dun,  FNP

## 2016-07-12 NOTE — Patient Instructions (Signed)
Fall Prevention in the Home Introduction Falls can cause injuries. They can happen to people of all ages. There are many things you can do to make your home safe and to help prevent falls. What can I do on the outside of my home?  Regularly fix the edges of walkways and driveways and fix any cracks.  Remove anything that might make you trip as you walk through a door, such as a raised step or threshold.  Trim any bushes or trees on the path to your home.  Use bright outdoor lighting.  Clear any walking paths of anything that might make someone trip, such as rocks or tools.  Regularly check to see if handrails are loose or broken. Make sure that both sides of any steps have handrails.  Any raised decks and porches should have guardrails on the edges.  Have any leaves, snow, or ice cleared regularly.  Use sand or salt on walking paths during winter.  Clean up any spills in your garage right away. This includes oil or grease spills. What can I do in the bathroom?  Use night lights.  Install grab bars by the toilet and in the tub and shower. Do not use towel bars as grab bars.  Use non-skid mats or decals in the tub or shower.  If you need to sit down in the shower, use a plastic, non-slip stool.  Keep the floor dry. Clean up any water that spills on the floor as soon as it happens.  Remove soap buildup in the tub or shower regularly.  Attach bath mats securely with double-sided non-slip rug tape.  Do not have throw rugs and other things on the floor that can make you trip. What can I do in the bedroom?  Use night lights.  Make sure that you have a light by your bed that is easy to reach.  Do not use any sheets or blankets that are too big for your bed. They should not hang down onto the floor.  Have a firm chair that has side arms. You can use this for support while you get dressed.  Do not have throw rugs and other things on the floor that can make you trip. What can  I do in the kitchen?  Clean up any spills right away.  Avoid walking on wet floors.  Keep items that you use a lot in easy-to-reach places.  If you need to reach something above you, use a strong step stool that has a grab bar.  Keep electrical cords out of the way.  Do not use floor polish or wax that makes floors slippery. If you must use wax, use non-skid floor wax.  Do not have throw rugs and other things on the floor that can make you trip. What can I do with my stairs?  Do not leave any items on the stairs.  Make sure that there are handrails on both sides of the stairs and use them. Fix handrails that are broken or loose. Make sure that handrails are as long as the stairways.  Check any carpeting to make sure that it is firmly attached to the stairs. Fix any carpet that is loose or worn.  Avoid having throw rugs at the top or bottom of the stairs. If you do have throw rugs, attach them to the floor with carpet tape.  Make sure that you have a light switch at the top of the stairs and the bottom of the stairs. If you   do not have them, ask someone to add them for you. What else can I do to help prevent falls?  Wear shoes that:  Do not have high heels.  Have rubber bottoms.  Are comfortable and fit you well.  Are closed at the toe. Do not wear sandals.  If you use a stepladder:  Make sure that it is fully opened. Do not climb a closed stepladder.  Make sure that both sides of the stepladder are locked into place.  Ask someone to hold it for you, if possible.  Clearly mark and make sure that you can see:  Any grab bars or handrails.  First and last steps.  Where the edge of each step is.  Use tools that help you move around (mobility aids) if they are needed. These include:  Canes.  Walkers.  Scooters.  Crutches.  Turn on the lights when you go into a dark area. Replace any light bulbs as soon as they burn out.  Set up your furniture so you have a  clear path. Avoid moving your furniture around.  If any of your floors are uneven, fix them.  If there are any pets around you, be aware of where they are.  Review your medicines with your doctor. Some medicines can make you feel dizzy. This can increase your chance of falling. Ask your doctor what other things that you can do to help prevent falls. This information is not intended to replace advice given to you by your health care provider. Make sure you discuss any questions you have with your health care provider. Document Released: 04/13/2009 Document Revised: 11/23/2015 Document Reviewed: 07/22/2014  2017 Elsevier  

## 2016-07-13 LAB — CMP14+EGFR
ALBUMIN: 4.2 g/dL (ref 3.5–4.8)
ALT: 14 IU/L (ref 0–32)
AST: 17 IU/L (ref 0–40)
Albumin/Globulin Ratio: 1.2 (ref 1.2–2.2)
Alkaline Phosphatase: 55 IU/L (ref 39–117)
BUN / CREAT RATIO: 24 (ref 12–28)
BUN: 29 mg/dL — AB (ref 8–27)
Bilirubin Total: 0.3 mg/dL (ref 0.0–1.2)
CALCIUM: 11.7 mg/dL — AB (ref 8.7–10.3)
CO2: 25 mmol/L (ref 18–29)
CREATININE: 1.21 mg/dL — AB (ref 0.57–1.00)
Chloride: 98 mmol/L (ref 96–106)
GFR calc non Af Amer: 43 mL/min/{1.73_m2} — ABNORMAL LOW (ref >59)
GFR, EST AFRICAN AMERICAN: 50 mL/min/{1.73_m2} — AB (ref >59)
GLUCOSE: 104 mg/dL — AB (ref 65–99)
Globulin, Total: 3.5 g/dL (ref 1.5–4.5)
Potassium: 4.3 mmol/L (ref 3.5–5.2)
Sodium: 136 mmol/L (ref 134–144)
TOTAL PROTEIN: 7.7 g/dL (ref 6.0–8.5)

## 2016-07-13 LAB — LIPID PANEL
CHOLESTEROL TOTAL: 237 mg/dL — AB (ref 100–199)
Chol/HDL Ratio: 6.8 ratio units — ABNORMAL HIGH (ref 0.0–4.4)
HDL: 35 mg/dL — ABNORMAL LOW (ref >39)
LDL CALC: 156 mg/dL — AB (ref 0–99)
Triglycerides: 230 mg/dL — ABNORMAL HIGH (ref 0–149)
VLDL CHOLESTEROL CAL: 46 mg/dL — AB (ref 5–40)

## 2016-07-13 LAB — MICROALBUMIN / CREATININE URINE RATIO
Creatinine, Urine: 108 mg/dL
Microalb/Creat Ratio: 32.6 mg/g creat — ABNORMAL HIGH (ref 0.0–30.0)
Microalbumin, Urine: 35.2 ug/mL

## 2016-07-15 ENCOUNTER — Other Ambulatory Visit: Payer: Self-pay | Admitting: Family

## 2016-07-26 ENCOUNTER — Ambulatory Visit (INDEPENDENT_AMBULATORY_CARE_PROVIDER_SITE_OTHER): Payer: Medicare HMO | Admitting: Family

## 2016-07-26 ENCOUNTER — Encounter: Payer: Self-pay | Admitting: Family

## 2016-07-26 VITALS — BP 134/68 | HR 78 | Temp 96.8°F | Ht 71.0 in | Wt 211.8 lb

## 2016-07-26 DIAGNOSIS — I10 Essential (primary) hypertension: Secondary | ICD-10-CM

## 2016-07-26 NOTE — Progress Notes (Signed)
   Subjective:    Patient ID: Caitlin Mccarthy, female    DOB: 01-04-1936, 81 y.o.   MRN: [id number]  Pt presents to the office today to recheck BP. PT was seen in the office on 07/12/16 and had a BP of 97/61. Pt's HCTZ was stopped and pt's BP is at goal today! Hypertension  This is a chronic problem. The current episode started more than 1 year ago. The problem has been resolved since onset. The problem is controlled. Pertinent negatives include no headaches, palpitations, peripheral edema or shortness of breath. Past treatments include ACE inhibitors and calcium channel blockers. The current treatment provides moderate improvement.      Review of Systems  Respiratory: Negative for shortness of breath.   Cardiovascular: Negative for palpitations.  Neurological: Negative for headaches.  All other systems reviewed and are negative.      Objective:   Physical Exam  Constitutional: She is oriented to person, place, and time. She appears well-developed and well-nourished. No distress.  Cardiovascular: Normal rate, regular rhythm, normal heart sounds and intact distal pulses.   No murmur heard. Pulmonary/Chest: Effort normal and breath sounds normal. No respiratory distress. She has no wheezes.  Musculoskeletal: Normal range of motion. She exhibits no edema or tenderness.  Use cane to walk with   Neurological: She is alert and oriented to person, place, and time.  Skin: Skin is warm and dry.  Psychiatric: She has a normal mood and affect. Her behavior is normal. Judgment and thought content normal.  Vitals reviewed.     BP 134/68   Pulse 78   Temp (!) 96.8 F (36 C) (Oral)   Ht 5\' 11"  (1.803 m)   Wt 211 lb 12.8 oz (96.1 kg)   BMI 29.54 kg/m      Assessment & Plan:  1. Essential hypertension -Dash diet information given -Exercise encouraged - Stress Management  -Continue current meds -RTO in 3 months   BP is at goal today!! Do not restart HCTZ  Evelina Dun,  FNP

## 2016-07-26 NOTE — Patient Instructions (Signed)
Hypertension Hypertension, commonly called high blood pressure, is when the force of blood pumping through your arteries is too strong. Your arteries are the blood vessels that carry blood from your heart throughout your body. A blood pressure reading consists of a higher number over a lower number, such as 110/72. The higher number (systolic) is the pressure inside your arteries when your heart pumps. The lower number (diastolic) is the pressure inside your arteries when your heart relaxes. Ideally you want your blood pressure below 120/80. Hypertension forces your heart to work harder to pump blood. Your arteries may become narrow or stiff. Having untreated or uncontrolled hypertension can cause heart attack, stroke, kidney disease, and other problems. What increases the risk? Some risk factors for high blood pressure are controllable. Others are not. Risk factors you cannot control include:  Race. You may be at higher risk if you are African American.  Age. Risk increases with age.  Gender. Men are at higher risk than women before age 45 years. After age 65, women are at higher risk than men. Risk factors you can control include:  Not getting enough exercise or physical activity.  Being overweight.  Getting too much fat, sugar, calories, or salt in your diet.  Drinking too much alcohol. What are the signs or symptoms? Hypertension does not usually cause signs or symptoms. Extremely high blood pressure (hypertensive crisis) may cause headache, anxiety, shortness of breath, and nosebleed. How is this diagnosed? To check if you have hypertension, your health care provider will measure your blood pressure while you are seated, with your arm held at the level of your heart. It should be measured at least twice using the same arm. Certain conditions can cause a difference in blood pressure between your right and left arms. A blood pressure reading that is higher than normal on one occasion does  not mean that you need treatment. If it is not clear whether you have high blood pressure, you may be asked to return on a different day to have your blood pressure checked again. Or, you may be asked to monitor your blood pressure at home for 1 or more weeks. How is this treated? Treating high blood pressure includes making lifestyle changes and possibly taking medicine. Living a healthy lifestyle can help lower high blood pressure. You may need to change some of your habits. Lifestyle changes may include:  Following the DASH diet. This diet is high in fruits, vegetables, and whole grains. It is low in salt, red meat, and added sugars.  Keep your sodium intake below 2,300 mg per day.  Getting at least 30-45 minutes of aerobic exercise at least 4 times per week.  Losing weight if necessary.  Not smoking.  Limiting alcoholic beverages.  Learning ways to reduce stress. Your health care provider may prescribe medicine if lifestyle changes are not enough to get your blood pressure under control, and if one of the following is true:  You are 18-59 years of age and your systolic blood pressure is above 140.  You are 60 years of age or older, and your systolic blood pressure is above 150.  Your diastolic blood pressure is above 90.  You have diabetes, and your systolic blood pressure is over 140 or your diastolic blood pressure is over 90.  You have kidney disease and your blood pressure is above 140/90.  You have heart disease and your blood pressure is above 140/90. Your personal target blood pressure may vary depending on your medical   conditions, your age, and other factors. Follow these instructions at home:  Have your blood pressure rechecked as directed by your health care provider.  Take medicines only as directed by your health care provider. Follow the directions carefully. Blood pressure medicines must be taken as prescribed. The medicine does not work as well when you skip  doses. Skipping doses also puts you at risk for problems.  Do not smoke.  Monitor your blood pressure at home as directed by your health care provider. Contact a health care provider if:  You think you are having a reaction to medicines taken.  You have recurrent headaches or feel dizzy.  You have swelling in your ankles.  You have trouble with your vision. Get help right away if:  You develop a severe headache or confusion.  You have unusual weakness, numbness, or feel faint.  You have severe chest or abdominal pain.  You vomit repeatedly.  You have trouble breathing. This information is not intended to replace advice given to you by your health care provider. Make sure you discuss any questions you have with your health care provider. Document Released: 06/17/2005 Document Revised: 11/23/2015 Document Reviewed: 04/09/2013 Elsevier Interactive Patient Education  2017 [employer] Inc[employer]  

## 2016-08-06 ENCOUNTER — Other Ambulatory Visit: Payer: Self-pay | Admitting: Rheumatology

## 2016-08-06 NOTE — Telephone Encounter (Signed)
Left message for patient to contact the office. Patient needs Vitamin D level.

## 2016-08-17 ENCOUNTER — Other Ambulatory Visit: Payer: Self-pay | Admitting: Rheumatology

## 2016-08-17 ENCOUNTER — Other Ambulatory Visit: Payer: Self-pay | Admitting: Nurse Practitioner

## 2016-08-19 NOTE — Telephone Encounter (Signed)
Left message for patient to contact the office. Patient will need a Vitamin D level checked.

## 2016-08-20 NOTE — Telephone Encounter (Signed)
Patient advised she needs her vitamin d level checked. Patient states she will have it drawn at her PCP's office this week and have resutls faxed to our office.

## 2016-08-21 DIAGNOSIS — H524 Presbyopia: Secondary | ICD-10-CM | POA: Diagnosis not present

## 2016-08-21 DIAGNOSIS — E109 Type 1 diabetes mellitus without complications: Secondary | ICD-10-CM | POA: Diagnosis not present

## 2016-08-21 DIAGNOSIS — I10 Essential (primary) hypertension: Secondary | ICD-10-CM | POA: Diagnosis not present

## 2016-08-21 LAB — HM DIABETES EYE EXAM

## 2016-08-23 ENCOUNTER — Encounter: Payer: Self-pay | Admitting: Nurse Practitioner

## 2016-08-23 ENCOUNTER — Ambulatory Visit (INDEPENDENT_AMBULATORY_CARE_PROVIDER_SITE_OTHER): Payer: Medicare HMO | Admitting: Nurse Practitioner

## 2016-08-23 VITALS — BP 139/67 | HR 85 | Temp 97.0°F | Ht 71.0 in | Wt 217.2 lb

## 2016-08-23 DIAGNOSIS — R300 Dysuria: Secondary | ICD-10-CM | POA: Diagnosis not present

## 2016-08-23 DIAGNOSIS — N3000 Acute cystitis without hematuria: Secondary | ICD-10-CM

## 2016-08-23 LAB — MICROSCOPIC EXAMINATION: RENAL EPITHEL UA: NONE SEEN /HPF

## 2016-08-23 LAB — URINALYSIS, COMPLETE
Bilirubin, UA: NEGATIVE
Glucose, UA: NEGATIVE
Ketones, UA: NEGATIVE
NITRITE UA: POSITIVE — AB
PH UA: 5.5 (ref 5.0–7.5)
Specific Gravity, UA: >1.03 — ABNORMAL HIGH (ref 1.005–1.030)
UUROB: 0.2 mg/dL (ref 0.2–1.0)

## 2016-08-23 MED ORDER — DOXYCYCLINE HYCLATE 100 MG PO TABS: 100.0000 mg | ORAL_TABLET | Freq: Two times a day (BID) | ORAL | 0 refills | Status: DC

## 2016-08-23 NOTE — Patient Instructions (Signed)
Urinary Tract Infection, Adult Introduction A urinary tract infection (UTI) is an infection of any part of the urinary tract. The urinary tract includes the:  Kidneys.  Ureters.  Bladder.  Urethra. These organs make, store, and get rid of pee (urine) in the body. Follow these instructions at home:  Take over-the-counter and prescription medicines only as told by your doctor.  If you were prescribed an antibiotic medicine, take it as told by your doctor. Do not stop taking the antibiotic even if you start to feel better.  Avoid the following drinks:  Alcohol.  Caffeine.  Tea.  Carbonated drinks.  Drink enough fluid to keep your pee clear or pale yellow.  Keep all follow-up visits as told by your doctor. This is important.  Make sure to:  Empty your bladder often and completely. Do not to hold pee for long periods of time.  Empty your bladder before and after sex.  Wipe from front to back after a bowel movement if you are female. Use each tissue one time when you wipe. Contact a doctor if:  You have back pain.  You have a fever.  You feel sick to your stomach (nauseous).  You throw up (vomit).  Your symptoms do not get better after 3 days.  Your symptoms go away and then come back. Get help right away if:  You have very bad back pain.  You have very bad lower belly (abdominal) pain.  You are throwing up and cannot keep down any medicines or water. This information is not intended to replace advice given to you by your health care provider. Make sure you discuss any questions you have with your health care provider. Document Released: 12/04/2007 Document Revised: 11/23/2015 Document Reviewed: 05/08/2015  2017 Elsevier  

## 2016-08-23 NOTE — Progress Notes (Signed)
   Subjective:    Patient ID: Caitlin Mccarthy, female    DOB: Jul 03, 1935, 81 y.o.   MRN: [id number]  HPI  Patient comes in today c/o dysuria, frequency and urgency- started about 2 days ago and has worsened.    Review of Systems  Constitutional: Negative.  Negative for fever.  HENT: Negative.   Respiratory: Negative.   Cardiovascular: Negative.   Gastrointestinal: Negative.   Genitourinary: Positive for dysuria, frequency and urgency. Negative for hematuria.  Musculoskeletal: Negative.   Neurological: Negative.   Psychiatric/Behavioral: Negative.   All other systems reviewed and are negative.      Objective:   Physical Exam  Constitutional: She is oriented to person, place, and time. She appears well-developed and well-nourished. No distress.  Cardiovascular: Normal rate.   Pulmonary/Chest: Effort normal.  Abdominal: Soft. There is tenderness (mild supra pubic pain on palaption).  Genitourinary:  Genitourinary Comments: NO CVA tenderness  Neurological: She is alert and oriented to person, place, and time.  Skin: Skin is warm.  Psychiatric: She has a normal mood and affect. Her behavior is normal. Judgment and thought content normal.   BP 139/67   Pulse 85   Temp 97 F (36.1 C) (Oral)   Ht 5\' 11"  (1.803 m)   Wt 217 lb 3.2 oz (98.5 kg)   BMI 30.29 kg/m    UA (+) nitrites  (+) Leuks- not enough urine to culture.     Assessment & Plan:  1. Dysuria - Urinalysis, Complete  2. Acute cystitis without hematuria Take medication as prescribe Cotton underwear Take shower not bath Cranberry juice, yogurt Force fluids AZO over the counter X2 days Culture unable to do because not enough urine- reviewed last culture and treated based on results RTO prn  Meds ordered this encounter  Medications  . doxycycline (VIBRA-TABS) 100 MG tablet    Sig: Take 1 tablet (100 mg total) by mouth 2 (two) times daily. 1 po bid    Dispense:  20 tablet    Refill:  0    Order Specific  Question:   Supervising Provider    Answer:   Eustaquio Maize [[id number]]   Mary-Margaret Hassell Done, FNP   - doxycycline (VIBRA-TABS) 100 MG tablet; Take 1 tablet (100 mg total) by mouth 2 (two) times daily. 1 po bid  Dispense: 20 tablet; Refill: 0

## 2016-08-27 ENCOUNTER — Telehealth: Payer: Self-pay | Admitting: *Deleted

## 2016-08-27 DIAGNOSIS — N200 Calculus of kidney: Secondary | ICD-10-CM | POA: Diagnosis not present

## 2016-08-27 DIAGNOSIS — N3020 Other chronic cystitis without hematuria: Secondary | ICD-10-CM | POA: Diagnosis not present

## 2016-08-27 MED ORDER — OMEPRAZOLE 20 MG PO CPDR: 20.0000 mg | DELAYED_RELEASE_CAPSULE | Freq: Every day | ORAL | 1 refills | Status: DC

## 2016-08-27 NOTE — Telephone Encounter (Signed)
Prevacid changed to Prilosec per [facility]

## 2016-08-28 ENCOUNTER — Other Ambulatory Visit: Payer: Self-pay | Admitting: Nurse Practitioner

## 2016-08-28 DIAGNOSIS — E785 Hyperlipidemia, unspecified: Secondary | ICD-10-CM

## 2016-09-04 NOTE — Telephone Encounter (Signed)
Patient aware.

## 2016-09-17 ENCOUNTER — Ambulatory Visit (INDEPENDENT_AMBULATORY_CARE_PROVIDER_SITE_OTHER): Payer: Medicare HMO | Admitting: Family

## 2016-09-17 ENCOUNTER — Encounter: Payer: Self-pay | Admitting: Family

## 2016-09-17 VITALS — BP 142/74 | HR 75 | Temp 96.9°F | Ht 71.0 in | Wt 212.8 lb

## 2016-09-17 DIAGNOSIS — L500 Allergic urticaria: Secondary | ICD-10-CM

## 2016-09-17 MED ORDER — METHYLPREDNISOLONE ACETATE 80 MG/ML IJ SUSP
80.0000 mg | Freq: Once | INTRAMUSCULAR | Status: AC
  Administered 2016-09-17: 80 mg via INTRAMUSCULAR

## 2016-09-17 NOTE — Patient Instructions (Addendum)
Hives Hives (urticaria) are itchy, red, swollen areas on your skin. Hives can appear on any part of your body and can vary in size. They can be as small as the tip of a pen or much larger. Hives often fade within 24 hours (acute hives). In other cases, new hives appear after old ones fade. This cycle can continue for several days or weeks (chronic hives). Hives result from your body's reaction to an irritant or to something that you are allergic to (trigger). When you are exposed to a trigger, your body releases a chemical (histamine) that causes redness, itching, and swelling. You can get hives immediately after being exposed to a trigger or hours later. Hives do not spread from person to person (are not contagious). Your hives may get worse with scratching, exercise, and emotional stress. What are the causes? Causes of this condition include:  Allergies to certain foods or ingredients.  Insect bites or stings.  Exposure to pollen or pet dander.  Contact with latex or chemicals.  Spending time in sunlight, heat, or cold (exposure).  Exercise.  Stress. You can also get hives from some medical conditions and treatments. These include:  Viruses, including the common cold.  Bacterial infections, such as urinary tract infections and strep throat.  Disorders such as vasculitis, lupus, or thyroid disease.  Certain medications.  Allergy shots.  Blood transfusions. Sometimes, the cause of hives is not known (idiopathic hives). What increases the risk? This condition is more likely to develop in:  Women.  People who have food allergies, especially to citrus fruits, milk, eggs, peanuts, tree nuts, or shellfish.  People who are allergic to:  Medicines.  Latex.  Insects.  Animals.  Pollen.  People who have certain medical conditions, includinglupus or thyroid disease. What are the signs or symptoms? The main symptom of this condition is raised, itchyred or white bumps or  patches on your skin. These areas may:  Become large and swollen (welts).  Change in shape and location, quickly and repeatedly.  Be separate hives or connect over a large area of skin.  Sting or become painful.  Turn white when pressed in the center (blanch). In severe cases, yourhands, feet, and face may also become swollen. This may occur if hives develop deeper in your skin. How is this diagnosed? This condition is diagnosed based on your symptoms, medical history, and physical exam. Your skin, urine, or blood may be tested to find out what is causing your hives and to rule out other health issues. Your health care provider may also remove a small sample of skin from the affected area and examine it under a microscope (biopsy). How is this treated? Treatment depends on the severity of your condition. Your health care provider may recommend using cool, wet cloths (cool compresses) or taking cool showers to relieve itching. Hives are sometimes treated with medicines, including:  Antihistamines.  Corticosteroids.  Antibiotics.  An injectable medicine (omalizumab). Your health care provider may prescribe this if you have chronic idiopathic hives and you continue to have symptoms even after treatment with antihistamines. Severe cases may require an emergency injection of adrenaline (epinephrine) to prevent a life-threatening allergic reaction (anaphylaxis). Follow these instructions at home: Medicines  Take or apply over-the-counter and prescription medicines only as told by your health care provider.  If you were prescribed an antibiotic medicine, use it as told by your health care provider. Do not stop taking the antibiotic even if you start to feel better. Skin Care    Apply cool compresses to the affected areas.  Do not scratch or rub your skin. General instructions  Do not take hot showers or baths. This can make itching worse.  Do not wear tight-fitting clothing.  Use  sunscreen and wear protective clothing when you are outside.  Avoid any substances that cause your hives. Keep a journal to help you track what causes your hives. Write down:  What medicines you take.  What you eat and drink.  What products you use on your skin.  Keep all follow-up visits as told by your health care provider. This is important. Contact a health care provider if:  Your symptoms are not controlled with medicine.  Your joints are painful or swollen. Get help right away if:  You have a fever.  You have pain in your abdomen.  Your tongue or lips are swollen.  Your eyelids are swollen.  Your chest or throat feels tight.  You have trouble breathing or swallowing. These symptoms may represent a serious problem that is an emergency. Do not wait to see if the symptoms will go away. Get medical help right away. Call your local emergency services (911 in the [place] Do not drive yourself to the hospital.  This information is not intended to replace advice given to you by your health care provider. Make sure you discuss any questions you have with your health care provider. Document Released: 06/17/2005 Document Revised: 11/15/2015 Document Reviewed: 04/05/2015 Elsevier Interactive Patient Education  2017 [employer] Inc.  

## 2016-09-17 NOTE — Progress Notes (Signed)
   Subjective:    Patient ID: Caitlin Mccarthy, female    DOB: [birth year], 81 y.o.   MRN: [id number]  Rash  This is a new problem. The current episode started 1 to 4 weeks ago. The problem has been gradually worsening since onset. The affected locations include the left arm, left lower leg, left upper leg, right arm, right lower leg and right upper leg. The rash is characterized by redness and itchiness. She was exposed to nothing. Pertinent negatives include no congestion, cough, diarrhea, fatigue, fever, joint pain, shortness of breath or sore throat. Past treatments include antihistamine, anti-itch cream, cold compress and moisturizer. The treatment provided no relief.      Review of Systems  Constitutional: Negative for fatigue and fever.  HENT: Negative for congestion and sore throat.   Respiratory: Negative for cough and shortness of breath.   Gastrointestinal: Negative for diarrhea.  Musculoskeletal: Negative for joint pain.  Skin: Positive for rash.  All other systems reviewed and are negative.      Objective:   Physical Exam  Constitutional: She is oriented to person, place, and time. She appears well-developed and well-nourished. No distress.  HENT:  Head: Normocephalic.  Eyes: Pupils are equal, round, and reactive to light.  Neck: Normal range of motion. Neck supple. No thyromegaly present.  Cardiovascular: Normal rate, regular rhythm, normal heart sounds and intact distal pulses.   No murmur heard. Pulmonary/Chest: Effort normal and breath sounds normal. No respiratory distress. She has no wheezes.  Abdominal: Soft. Bowel sounds are normal. She exhibits no distension. There is no tenderness.  Musculoskeletal: Normal range of motion. She exhibits no edema or tenderness.  Neurological: She is alert and oriented to person, place, and time.  Skin: Skin is warm and dry. Rash noted. Rash is urticarial (bilateral arms and legs).  Psychiatric: She has a normal mood and affect.  Her behavior is normal. Judgment and thought content normal.  Vitals reviewed.     BP (!) 169/78   Pulse 68   Temp (!) 96.9 F (36.1 C) (Oral)   Ht 5\' 11"  (1.803 m)   Wt 212 lb 12.8 oz (96.5 kg)   BMI 29.68 kg/m      Assessment & Plan:  1. Allergic urticaria Take daily zyrtec  Benadryl as needed Discuss keeping journal of food If continues could be Benazepril? RTO if does not improve - methylPREDNISolone acetate (DEPO-MEDROL) injection 80 mg; Inject 1 mL (80 mg total) into the muscle once.   Evelina Dun, FNP

## 2016-09-23 ENCOUNTER — Ambulatory Visit (INDEPENDENT_AMBULATORY_CARE_PROVIDER_SITE_OTHER): Payer: Medicare HMO | Admitting: Nurse Practitioner

## 2016-09-23 ENCOUNTER — Encounter: Payer: Self-pay | Admitting: Nurse Practitioner

## 2016-09-23 VITALS — BP 154/75 | HR 74 | Temp 96.8°F | Ht 71.0 in | Wt 209.0 lb

## 2016-09-23 DIAGNOSIS — L500 Allergic urticaria: Secondary | ICD-10-CM | POA: Diagnosis not present

## 2016-09-23 MED ORDER — HYDROXYZINE HCL 25 MG PO TABS: 25.0000 mg | ORAL_TABLET | Freq: Three times a day (TID) | ORAL | 0 refills | Status: DC | PRN

## 2016-09-23 MED ORDER — PREDNISONE 20 MG PO TABS: ORAL_TABLET | ORAL | 0 refills | Status: DC

## 2016-09-23 NOTE — Patient Instructions (Signed)
urticaria urticaria is sudden swelling in the body. The swelling can happen in any part of the body. It often happens on the skin and causes itchy, bumpy patches (hives) to form. This condition may:  Happen only one time.  Happen more than one time. It may come back at random times.  Keep coming back for a number of years. Someday it may stop coming back. Follow these instructions at home:  Take over-the-counter and prescription medicines only as told by your doctor.  If you were given medicines for emergency allergy treatment, always carry them with you.  Wear a medical bracelet as told by your doctor.  Avoid the things that cause your attacks (triggers).  If this condition was passed to you from your parents and you want to have kids, talk to your doctor. Your kids may also have this condition. Contact a doctor if:  You have another attack.  Your attacks happen more often, even after you take steps to prevent them.  This condition was passed to you by your parents and you want to have kids. Get help right away if:  Your mouth, tongue, or lips get very swollen.  You have trouble breathing.  You have trouble swallowing.  You pass out (faint). This information is not intended to replace advice given to you by your health care provider. Make sure you discuss any questions you have with your health care provider. Document Released: 06/05/2009 Document Revised: 01/17/2016 Document Reviewed: 12/26/2015 Elsevier Interactive Patient Education  2017 [employer].

## 2016-09-23 NOTE — Progress Notes (Signed)
   Subjective:    Patient ID: Caitlin Mccarthy, female    DOB: 03/09/1936, 81 y.o.   MRN: [id number]  HPI Patient in today c/o rash - was seen by C. Hawks last week and was given a steroid shot- did not make rash completely go away. SHe say that rash has been intermittent for 3 weeks. It is mainly on arms and legs- itches like crazy-denies new soap, detergents or foods.    Review of Systems  Constitutional: Negative.   HENT: Negative.   Respiratory: Negative.   Cardiovascular: Negative.   Genitourinary: Negative.   Neurological: Negative.   Psychiatric/Behavioral: Negative.   All other systems reviewed and are negative.      Objective:   Physical Exam  Constitutional: She is oriented to person, place, and time. She appears well-developed and well-nourished. No distress.  Cardiovascular: Normal rate and regular rhythm.   Pulmonary/Chest: Effort normal and breath sounds normal.  Neurological: She is alert and oriented to person, place, and time.  Skin: Skin is warm.  Psychiatric: She has a normal mood and affect. Her behavior is normal. Judgment and thought content normal.    BP (!) 154/75   Pulse 74   Temp (!) 96.8 F (36 C) (Oral)   Ht 5\' 11"  (1.803 m)   Wt 209 lb (94.8 kg)   BMI 29.15 kg/m      Assessment & Plan:  1. Allergic urticaria Avoid scratching Keep food diary Watch blood sugars while on prednisone- call if get over 300 Meds ordered this encounter  Medications  . hydrOXYzine (ATARAX/VISTARIL) 25 MG tablet    Sig: Take 1 tablet (25 mg total) by mouth 3 (three) times daily as needed.    Dispense:  30 tablet    Refill:  0    Order Specific Question:   Supervising Provider    Answer:   VINCENT, CAROL L [[id number]]  . predniSONE (DELTASONE) 20 MG tablet    Sig: 2 po at sametime daily for 5 days    Dispense:  10 tablet    Refill:  0    Order Specific Question:   Supervising Provider    Answer:   Eustaquio Maize [[id number]]   Mary-Margaret Hassell Done, FNP

## 2016-09-26 ENCOUNTER — Other Ambulatory Visit: Payer: Self-pay | Admitting: Nurse Practitioner

## 2016-09-26 DIAGNOSIS — I10 Essential (primary) hypertension: Secondary | ICD-10-CM

## 2016-10-01 DIAGNOSIS — L509 Urticaria, unspecified: Secondary | ICD-10-CM | POA: Diagnosis not present

## 2016-10-01 DIAGNOSIS — L280 Lichen simplex chronicus: Secondary | ICD-10-CM | POA: Diagnosis not present

## 2016-10-08 ENCOUNTER — Encounter: Payer: Self-pay | Admitting: Family

## 2016-10-08 ENCOUNTER — Ambulatory Visit (INDEPENDENT_AMBULATORY_CARE_PROVIDER_SITE_OTHER): Payer: Medicare HMO | Admitting: Family

## 2016-10-08 VITALS — BP 155/78 | HR 72 | Temp 97.1°F | Ht 71.0 in | Wt 213.2 lb

## 2016-10-08 DIAGNOSIS — M160 Bilateral primary osteoarthritis of hip: Secondary | ICD-10-CM | POA: Diagnosis not present

## 2016-10-08 DIAGNOSIS — M1990 Unspecified osteoarthritis, unspecified site: Secondary | ICD-10-CM | POA: Diagnosis not present

## 2016-10-08 DIAGNOSIS — N3001 Acute cystitis with hematuria: Secondary | ICD-10-CM | POA: Diagnosis not present

## 2016-10-08 DIAGNOSIS — R102 Pelvic and perineal pain: Secondary | ICD-10-CM | POA: Diagnosis not present

## 2016-10-08 LAB — URINALYSIS, COMPLETE
BILIRUBIN UA: NEGATIVE
Glucose, UA: NEGATIVE
Ketones, UA: NEGATIVE
NITRITE UA: POSITIVE — AB
PH UA: 5.5 (ref 5.0–7.5)
Specific Gravity, UA: 1.025 (ref 1.005–1.030)
UUROB: 0.2 mg/dL (ref 0.2–1.0)

## 2016-10-08 LAB — MICROSCOPIC EXAMINATION: RENAL EPITHEL UA: NONE SEEN /HPF

## 2016-10-08 MED ORDER — TRAMADOL HCL 50 MG PO TABS: 50.0000 mg | ORAL_TABLET | Freq: Two times a day (BID) | ORAL | 2 refills | Status: DC | PRN

## 2016-10-08 MED ORDER — NITROFURANTOIN MONOHYD MACRO 100 MG PO CAPS: 100.0000 mg | ORAL_CAPSULE | Freq: Two times a day (BID) | ORAL | 0 refills | Status: DC

## 2016-10-08 NOTE — Patient Instructions (Signed)

## 2016-10-08 NOTE — Progress Notes (Signed)
   Subjective:    Patient ID: Caitlin Mccarthy, female    DOB: [birth year], 81 y.o.   MRN: [id number]  Urinary Frequency   This is a new problem. The current episode started in the past 7 days. The problem occurs intermittently. The problem has been waxing and waning. The patient is experiencing no pain. Associated symptoms include frequency, hesitancy and urgency. Pertinent negatives include no flank pain, hematuria, nausea or vomiting. She has tried increased fluids for the symptoms. The treatment provided mild relief.      Review of Systems  Gastrointestinal: Negative for nausea and vomiting.  Genitourinary: Positive for frequency, hesitancy and urgency. Negative for flank pain and hematuria.  All other systems reviewed and are negative.      Objective:   Physical Exam  Constitutional: She is oriented to person, place, and time. She appears well-developed and well-nourished. No distress.  HENT:  Head: Normocephalic.  Eyes: Pupils are equal, round, and reactive to light.  Neck: Normal range of motion. Neck supple. No thyromegaly present.  Cardiovascular: Normal rate, regular rhythm, normal heart sounds and intact distal pulses.   No murmur heard. Pulmonary/Chest: Effort normal and breath sounds normal. No respiratory distress. She has no wheezes.  Abdominal: Soft. Bowel sounds are normal. She exhibits no distension. There is no tenderness.  Musculoskeletal: Normal range of motion. She exhibits tenderness (in bilateral knees with flexion). She exhibits no edema.  Neurological: She is alert and oriented to person, place, and time.  Skin: Skin is warm and dry.  Psychiatric: She has a normal mood and affect. Her behavior is normal. Judgment and thought content normal.  Vitals reviewed.    BP (!) 155/78   Pulse 72   Temp 97.1 F (36.2 C) (Oral)   Ht 5\' 11"  (1.803 m)   Wt 213 lb 3.2 oz (96.7 kg)   BMI 29.74 kg/m       Assessment & Plan:  1. Pelvic pressure in female -  Urinalysis, Complete  2. Acute cystitis with hematuria Force fluids AZO over the counter X2 days RTO prn Culture pending - nitrofurantoin, macrocrystal-monohydrate, (MACROBID) 100 MG capsule; Take 1 capsule (100 mg total) by mouth 2 (two) times daily.  Dispense: 10 capsule; Refill: 0 - Urine culture  Evelina Dun, FNP

## 2016-10-10 ENCOUNTER — Encounter: Payer: Self-pay | Admitting: Family

## 2016-10-10 ENCOUNTER — Ambulatory Visit (INDEPENDENT_AMBULATORY_CARE_PROVIDER_SITE_OTHER): Payer: Medicare HMO | Admitting: Family

## 2016-10-10 VITALS — BP 131/72 | HR 78 | Temp 96.7°F | Ht 71.0 in | Wt 209.0 lb

## 2016-10-10 DIAGNOSIS — E1165 Type 2 diabetes mellitus with hyperglycemia: Secondary | ICD-10-CM | POA: Diagnosis not present

## 2016-10-10 DIAGNOSIS — G8929 Other chronic pain: Secondary | ICD-10-CM

## 2016-10-10 DIAGNOSIS — F17200 Nicotine dependence, unspecified, uncomplicated: Secondary | ICD-10-CM | POA: Diagnosis not present

## 2016-10-10 DIAGNOSIS — E559 Vitamin D deficiency, unspecified: Secondary | ICD-10-CM | POA: Diagnosis not present

## 2016-10-10 DIAGNOSIS — I10 Essential (primary) hypertension: Secondary | ICD-10-CM

## 2016-10-10 DIAGNOSIS — M810 Age-related osteoporosis without current pathological fracture: Secondary | ICD-10-CM

## 2016-10-10 DIAGNOSIS — F411 Generalized anxiety disorder: Secondary | ICD-10-CM

## 2016-10-10 DIAGNOSIS — E663 Overweight: Secondary | ICD-10-CM | POA: Diagnosis not present

## 2016-10-10 DIAGNOSIS — M1990 Unspecified osteoarthritis, unspecified site: Secondary | ICD-10-CM

## 2016-10-10 DIAGNOSIS — K219 Gastro-esophageal reflux disease without esophagitis: Secondary | ICD-10-CM

## 2016-10-10 DIAGNOSIS — M160 Bilateral primary osteoarthritis of hip: Secondary | ICD-10-CM

## 2016-10-10 DIAGNOSIS — E785 Hyperlipidemia, unspecified: Secondary | ICD-10-CM

## 2016-10-10 DIAGNOSIS — F339 Major depressive disorder, recurrent, unspecified: Secondary | ICD-10-CM

## 2016-10-10 DIAGNOSIS — E8881 Metabolic syndrome: Secondary | ICD-10-CM

## 2016-10-10 LAB — BAYER DCA HB A1C WAIVED: HB A1C: 6.8 % (ref <7.0)

## 2016-10-10 MED ORDER — TRAMADOL HCL 50 MG PO TABS: 50.0000 mg | ORAL_TABLET | Freq: Two times a day (BID) | ORAL | 2 refills | Status: DC | PRN

## 2016-10-10 NOTE — Progress Notes (Signed)
Subjective:    Patient ID: Caitlin Mccarthy, female    DOB: 10-09-1935, 81 y.o.   MRN: [id number]  Patient here today for follow up of chronic medical problems.  Diabetes  She presents for her follow-up diabetic visit. She has type 2 diabetes mellitus. Hypoglycemia symptoms include nervousness/anxiousness. Associated symptoms include foot paresthesias ("at times"). Pertinent negatives for diabetes include no blurred vision, no chest pain, no polydipsia, no polyphagia and no polyuria. There are no hypoglycemic complications. Diabetic complications include peripheral neuropathy. Pertinent negatives for diabetic complications include no CVA, heart disease or nephropathy. Risk factors for coronary artery disease include diabetes mellitus, dyslipidemia, obesity, hypertension, sedentary lifestyle and post-menopausal. Current diabetic treatment includes oral agent (monotherapy). She is compliant with treatment all of the time. Her weight is stable. She is following a diabetic diet. (Doesn't check her BS ) An ACE inhibitor/angiotensin II receptor blocker is being taken. Eye exam is current (12/2015).  Hypertension  This is a chronic problem. The current episode started more than 1 year ago. The problem has been resolved (hypotensive today) since onset. The problem is controlled. Associated symptoms include anxiety. Pertinent negatives include no blurred vision, chest pain, palpitations or peripheral edema. Risk factors for coronary artery disease include dyslipidemia, family history, obesity, smoking/tobacco exposure, post-menopausal state and sedentary lifestyle. Past treatments include calcium channel blockers, ACE inhibitors and diuretics. The current treatment provides moderate improvement. There is no history of kidney disease, CAD/MI, CVA or heart failure. There is no history of a thyroid problem.  Hyperlipidemia  This is a chronic problem. The current episode started more than 1 year ago. The problem is  uncontrolled. Recent lipid tests were reviewed and are high. Exacerbating diseases include obesity. She has no history of diabetes or hypothyroidism. Factors aggravating her hyperlipidemia include smoking. Pertinent negatives include no chest pain or myalgias. Current antihyperlipidemic treatment includes statins. The current treatment provides moderate improvement of lipids. Compliance problems include adherence to diet and adherence to exercise.  Risk factors for coronary artery disease include dyslipidemia, hypertension, obesity, post-menopausal and a sedentary lifestyle.  Gastroesophageal Reflux  She reports no belching, no chest pain, no heartburn, no nausea or no water brash. This is a chronic problem. The current episode started more than 1 year ago. The problem occurs occasionally. The problem has been waxing and waning. The heartburn does not wake her from sleep. The symptoms are aggravated by certain foods and lying down. Risk factors include obesity. She has tried a PPI for the symptoms. The treatment provided moderate relief.  Arthritis  Presents for follow-up visit. The disease course has been fluctuating. She complains of pain, joint swelling and joint warmth. Affected locations include the right knee, left knee and right hip (right hip). Her pain is at a severity of 9/10. Pertinent negatives include no dry mouth, dysuria, pain at night, pain while resting or uveitis. Her past medical history is significant for osteoarthritis. Past treatments include rest and NSAIDs. The treatment provided mild relief.  Anxiety  Presents for initial visit. Onset was more than 5 years ago. The problem has been waxing and waning. Symptoms include excessive worry, irritability, nervous/anxious behavior and restlessness. Patient reports no chest pain, dry mouth, nausea or palpitations. Symptoms occur most days. The symptoms are aggravated by family issues. The quality of sleep is good.   Her past medical history is  significant for anxiety/panic attacks.  Metabolic syndrome Does not check blood sugars at home. Tries to watch carb intake Osteoporosis  PT currently taking  Fosamax 70 mg weekly and calcium and Vit D. PT's last Dexa Scan was 01/31/16.   Review of Systems  Constitutional: Positive for irritability.  HENT: Negative.   Eyes: Negative.  Negative for blurred vision.  Cardiovascular: Negative.  Negative for chest pain, palpitations and leg swelling.  Gastrointestinal: Negative for heartburn and nausea.  Endocrine: Negative for polydipsia, polyphagia and polyuria.  Genitourinary: Negative.  Negative for dysuria.       Denies urinary incontinence  Musculoskeletal: Positive for arthralgias, arthritis, gait problem and joint swelling. Negative for myalgias.  Psychiatric/Behavioral: The patient is nervous/anxious.   All other systems reviewed and are negative.      Objective:   Physical Exam  Constitutional: She is oriented to person, place, and time. She appears well-developed and well-nourished.  HENT:  Nose: Nose normal.  Mouth/Throat: Oropharynx is clear and moist.  Eyes: EOM are normal.  Neck: Trachea normal, normal range of motion and full passive range of motion without pain. Neck supple. No JVD present. Carotid bruit is not present. No thyromegaly present.  Cardiovascular: Normal rate, regular rhythm, normal heart sounds and intact distal pulses.  Exam reveals no gallop and no friction rub.   No murmur heard. Pulmonary/Chest: Effort normal and breath sounds normal. No respiratory distress.  Abdominal: Soft. Bowel sounds are normal. She exhibits no distension and no mass. There is no tenderness.  Musculoskeletal: She exhibits tenderness. She exhibits no edema.  Pt walking with cane and slow movements with gait related to pain  Lymphadenopathy:    She has no cervical adenopathy.  Neurological: She is alert and oriented to person, place, and time.  Skin: Skin is warm and dry. No rash  noted.  Psychiatric: She has a normal mood and affect. Her behavior is normal. Judgment and thought content normal.   BP 131/72   Pulse 78   Temp (!) 96.7 F (35.9 C) (Oral)   Ht '5\' 11"'  (1.803 m)   Wt 209 lb (94.8 kg)   BMI 29.15 kg/m       Assessment & Plan:  1. Essential hypertension, benign - CMP14+EGFR  2. Gastroesophageal reflux disease without esophagitis - CMP14+EGFR  3. Type 2 diabetes mellitus with hyperglycemia, without long-term current use of insulin (HCC) - CMP14+EGFR - Bayer DCA Hb A1c Waived  4. Age-related osteoporosis without current pathological fracture - CMP14+EGFR  5. Arthritis - CMP14+EGFR - traMADol (ULTRAM) 50 MG tablet; Take 1-2 tablets (50-100 mg total) by mouth every 12 (twelve) hours as needed.  Dispense: 90 tablet; Refill: 2  6. Overweight (BMI 25.0-29.9) - CMP14+EGFR  7. Vitamin D deficiency - RAQ76+AUQJ  8. Metabolic syndrome - FHL45+GYBW - Lipid panel  9. Hyperlipidemia with target LDL less than 100 - CMP14+EGFR - Lipid panel  10. Current smoker - CMP14+EGFR  11. Episode of recurrent major depressive disorder, unspecified depression episode severity ([place]) - CMP14+EGFR  12. GAD (generalized anxiety disorder) - CMP14+EGFR  13. Other chronic pain - CMP14+EGFR  14. Primary osteoarthritis of both hips - CMP14+EGFR - traMADol (ULTRAM) 50 MG tablet; Take 1-2 tablets (50-100 mg total) by mouth every 12 (twelve) hours as needed.  Dispense: 90 tablet; Refill: 2   Continue all meds Labs pending Health Maintenance reviewed Diet and exercise encouraged RTO 3 months   Evelina Dun, FNP

## 2016-10-10 NOTE — Patient Instructions (Signed)
Osteoarthritis  Osteoarthritis is a type of arthritis that affects tissue that covers the ends of bones in joints (cartilage). Cartilage acts as a cushion between the bones and helps them move smoothly. Osteoarthritis results when cartilage in the joints gets worn down. Osteoarthritis is sometimes called "wear and tear" arthritis.  Osteoarthritis is the most common form of arthritis. It often occurs in older people. It is a condition that gets worse over time (a progressive condition). Joints that are most often affected by this condition are in:  · Fingers.  · Toes.  · Hips.  · Knees.  · Spine, including neck and lower back.    What are the causes?  This condition is caused by age-related wearing down of cartilage that covers the ends of bones.  What increases the risk?  The following factors may make you more likely to develop this condition:  · Older age.  · Being overweight or obese.  · Overuse of joints, such as in athletes.  · Past injury of a joint.  · Past surgery on a joint.  · Family history of osteoarthritis.    What are the signs or symptoms?  The main symptoms of this condition are pain, swelling, and stiffness in the joint. The joint may lose its shape over time. Small pieces of bone or cartilage may break off and float inside of the joint, which may cause more pain and damage to the joint. Small deposits of bone (osteophytes) may grow on the edges of the joint. Other symptoms may include:  · A grating or scraping feeling inside the joint when you move it.  · Popping or creaking sounds when you move.    Symptoms may affect one or more joints. Osteoarthritis in a major joint, such as your knee or hip, can make it painful to walk or exercise. If you have osteoarthritis in your hands, you might not be able to grip items, twist your hand, or control small movements of your hands and fingers (fine motor skills).  How is this diagnosed?  This condition may be diagnosed based on:  · Your medical history.  · A  physical exam.  · Your symptoms.  · X-rays of the affected joint(s).  · Blood tests to rule out other types of arthritis.    How is this treated?  There is no cure for this condition, but treatment can help to control pain and improve joint function. Treatment plans may include:  · A prescribed exercise program that allows for rest and joint relief. You may work with a physical therapist.  · A weight control plan.  · Pain relief techniques, such as:  ? Applying heat and cold to the joint.  ? Electric pulses delivered to nerve endings under the skin (transcutaneous electrical nerve stimulation, or TENS).  ? Massage.  ? Certain nutritional supplements.  · NSAIDs or prescription medicines to help relieve pain.  · Medicine to help relieve pain and inflammation (corticosteroids). This can be given by mouth (orally) or as an injection.  · Assistive devices, such as a brace, wrap, splint, specialized glove, or cane.  · Surgery, such as:  ? An osteotomy. This is done to reposition the bones and relieve pain or to remove loose pieces of bone and cartilage.  ? Joint replacement surgery. You may need this surgery if you have very bad (advanced) osteoarthritis.    Follow these instructions at home:  Activity   · Rest your affected joints as directed by your   health care provider.  · Do not drive or use heavy machinery while taking prescription pain medicine.  · Exercise as directed. Your health care provider or physical therapist may recommend specific types of exercise, such as:  ? Strengthening exercises. These are done to strengthen the muscles that support joints that are affected by arthritis. They can be performed with weights or with exercise bands to add resistance.  ? Aerobic activities. These are exercises, such as brisk walking or water aerobics, that get your heart pumping.  ? Range-of-motion activities. These keep your joints easy to move.  ? Balance and agility exercises.  Managing pain, stiffness, and swelling    · If directed, apply heat to the affected area as often as told by your health care provider. Use the heat source that your health care provider recommends, such as a moist heat pack or a heating pad.  ? If you have a removable assistive device, remove it as told by your health care provider.  ? Place a towel between your skin and the heat source. If your health care provider tells you to keep the assistive device on while you apply heat, place a towel between the assistive device and the heat source.  ? Leave the heat on for 20-30 minutes.  ? Remove the heat if your skin turns bright red. This is especially important if you are unable to feel pain, heat, or cold. You may have a greater risk of getting burned.  · If directed, put ice on the affected joint:  ? If you have a removable assistive device, remove it as told by your health care provider.  ? Put ice in a plastic bag.  ? Place a towel between your skin and the bag. If your health care provider tells you to keep the assistive device on during icing, place a towel between the assistive device and the bag.  ? Leave the ice on for 20 minutes, 2-3 times a day.  General instructions   · Take over-the-counter and prescription medicines only as told by your health care provider.  · Maintain a healthy weight. Follow instructions from your health care provider for weight control. These may include dietary restrictions.  · Do not use any products that contain nicotine or tobacco, such as cigarettes and e-cigarettes. These can delay bone healing. If you need help quitting, ask your health care provider.  · Use assistive devices as directed by your health care provider.  · Keep all follow-up visits as told by your health care provider. This is important.  Where to find more information:  · National Institute of Arthritis and Musculoskeletal and Skin Diseases: www.niams.nih.gov  · National Institute on Aging: www.nia.nih.gov  · American College of Rheumatology:  www.rheumatology.org  Contact a health care provider if:  · Your skin turns red.  · You develop a rash.  · You have pain that gets worse.  · You have a fever along with joint or muscle aches.  Get help right away if:  · You lose a lot of weight.  · You suddenly lose your appetite.  · You have night sweats.  Summary  · Osteoarthritis is a type of arthritis that affects tissue covering the ends of bones in joints (cartilage).  · This condition is caused by age-related wearing down of cartilage that covers the ends of bones.  · The main symptom of this condition is pain, swelling, and stiffness in the joint.  · There is no cure for this   condition, but treatment can help to control pain and improve joint function.  This information is not intended to replace advice given to you by your health care provider. Make sure you discuss any questions you have with your health care provider.  Document Released: 06/17/2005 Document Revised: 02/19/2016 Document Reviewed: 02/19/2016  Elsevier Interactive Patient Education © 2017 [employer]

## 2016-10-11 LAB — LIPID PANEL
CHOL/HDL RATIO: 7 ratio — AB (ref 0.0–4.4)
Cholesterol, Total: 278 mg/dL — ABNORMAL HIGH (ref 100–199)
HDL: 40 mg/dL (ref >39)
LDL Calculated: 173 mg/dL — ABNORMAL HIGH (ref 0–99)
TRIGLYCERIDES: 326 mg/dL — AB (ref 0–149)
VLDL CHOLESTEROL CAL: 65 mg/dL — AB (ref 5–40)

## 2016-10-11 LAB — CMP14+EGFR
ALT: 30 IU/L (ref 0–32)
AST: 36 IU/L (ref 0–40)
Albumin/Globulin Ratio: 1.5 (ref 1.2–2.2)
Albumin: 4.6 g/dL (ref 3.5–4.8)
Alkaline Phosphatase: 66 IU/L (ref 39–117)
BUN/Creatinine Ratio: 22 (ref 12–28)
BUN: 17 mg/dL (ref 8–27)
Bilirubin Total: 0.3 mg/dL (ref 0.0–1.2)
CALCIUM: 11.5 mg/dL — AB (ref 8.7–10.3)
CO2: 23 mmol/L (ref 18–29)
Chloride: 99 mmol/L (ref 96–106)
Creatinine, Ser: 0.79 mg/dL (ref 0.57–1.00)
GFR, EST AFRICAN AMERICAN: 83 mL/min/{1.73_m2} (ref >59)
GFR, EST NON AFRICAN AMERICAN: 72 mL/min/{1.73_m2} (ref >59)
GLUCOSE: 101 mg/dL — AB (ref 65–99)
Globulin, Total: 3.1 g/dL (ref 1.5–4.5)
Potassium: 4.8 mmol/L (ref 3.5–5.2)
Sodium: 138 mmol/L (ref 134–144)
TOTAL PROTEIN: 7.7 g/dL (ref 6.0–8.5)

## 2016-10-11 LAB — URINE CULTURE

## 2016-10-12 ENCOUNTER — Other Ambulatory Visit: Payer: Self-pay | Admitting: Family

## 2016-10-17 DIAGNOSIS — L570 Actinic keratosis: Secondary | ICD-10-CM | POA: Diagnosis not present

## 2016-10-22 ENCOUNTER — Other Ambulatory Visit: Payer: Self-pay | Admitting: Nurse Practitioner

## 2016-10-22 DIAGNOSIS — E785 Hyperlipidemia, unspecified: Secondary | ICD-10-CM

## 2016-11-07 ENCOUNTER — Encounter: Payer: Self-pay | Admitting: Pediatrics

## 2016-11-07 ENCOUNTER — Ambulatory Visit (INDEPENDENT_AMBULATORY_CARE_PROVIDER_SITE_OTHER): Payer: Medicare HMO | Admitting: Pediatrics

## 2016-11-07 VITALS — BP 137/66 | HR 72 | Temp 97.0°F | Ht 71.0 in | Wt 212.4 lb

## 2016-11-07 DIAGNOSIS — L500 Allergic urticaria: Secondary | ICD-10-CM

## 2016-11-07 DIAGNOSIS — N3090 Cystitis, unspecified without hematuria: Secondary | ICD-10-CM | POA: Diagnosis not present

## 2016-11-07 DIAGNOSIS — R300 Dysuria: Secondary | ICD-10-CM

## 2016-11-07 DIAGNOSIS — I10 Essential (primary) hypertension: Secondary | ICD-10-CM

## 2016-11-07 LAB — MICROSCOPIC EXAMINATION
Renal Epithel, UA: NONE SEEN /hpf
WBC, UA: >30 /hpf — AB (ref 0–?)

## 2016-11-07 LAB — URINALYSIS, COMPLETE
Bilirubin, UA: NEGATIVE
GLUCOSE, UA: NEGATIVE
Ketones, UA: NEGATIVE
NITRITE UA: POSITIVE — AB
PH UA: 6 (ref 5.0–7.5)
Specific Gravity, UA: 1.02 (ref 1.005–1.030)
UUROB: 0.2 mg/dL (ref 0.2–1.0)

## 2016-11-07 MED ORDER — CIPROFLOXACIN HCL 250 MG PO TABS: 250.0000 mg | ORAL_TABLET | Freq: Two times a day (BID) | ORAL | 0 refills | Status: DC

## 2016-11-07 MED ORDER — HYDROXYZINE HCL 25 MG PO TABS: 25.0000 mg | ORAL_TABLET | Freq: Three times a day (TID) | ORAL | 0 refills | Status: DC | PRN

## 2016-11-07 NOTE — Progress Notes (Signed)
  Subjective:   Patient ID: Caitlin Mccarthy, female    DOB: 06-Oct-1935, 81 y.o.   MRN: [id number] CC: Dysuria (x 1 week)  HPI: Caitlin Mccarthy is a 81 y.o. female presenting for Dysuria (x 1 week)  No fever Burning at end of urination No abd pain, nausea  Breaking out on arms b/l  Takes benadryl for it No new medications Seen by Dr. Trish Mage on triamcinolone BID Helping some, still itching Due for another appt  HTN: taking meds regularly No CP, HA, SOB  Relevant past medical, surgical, family and social history reviewed. Allergies and medications reviewed and updated. History  Smoking Status  . Current Every Day Smoker  . Packs/day: 0.50  . Years: 40.00  . Types: Cigarettes  Smokeless Tobacco  . Never Used   ROS: Per HPI   Objective:    BP 137/66   Pulse 72   Temp 97 F (36.1 C) (Oral)   Ht 5\' 11"  (1.803 m)   Wt 212 lb 6.4 oz (96.3 kg)   BMI 29.62 kg/m   Wt Readings from Last 3 Encounters:  11/07/16 212 lb 6.4 oz (96.3 kg)  10/10/16 209 lb (94.8 kg)  10/08/16 213 lb 3.2 oz (96.7 kg)    Gen: NAD, alert, cooperative with exam, NCAT EYES: EOMI, no conjunctival injection, or no icterus CV: NRRR, normal S1/S2, no murmur, distal pulses 2+ b/l Resp: CTABL, no wheezes, normal WOB Abd: +BS, soft, NTND.  Ext: No edema, warm Neuro: Alert and oriented, strength equal b/l UE and LE MSK: normal muscle bulk  Assessment & Plan:  Caitlin Mccarthy was seen today for dysuria, med problems  Diagnoses and all orders for this visit:  Dysuria UA positive, f/u culture, treat as below -     Urinalysis, Complete -     Urine culture  Allergic urticaria Follows with dermatology, cont below -     hydrOXYzine (ATARAX/VISTARIL) 25 MG tablet; Take 1 tablet (25 mg total) by mouth 3 (three) times daily as needed.  Cystitis Start below -     ciprofloxacin (CIPRO) 250 MG tablet; Take 1 tablet (250 mg total) by mouth 2 (two) times daily.  Essential hypertension,  benign Elevated initially Improved with recheck. Adequate control, cont current meds  Other orders -     Microscopic Examination   Follow up plan: 3 mo, sooner if needed Caitlin Found, MD [facility]

## 2016-11-09 ENCOUNTER — Encounter: Payer: Self-pay | Admitting: Pediatrics

## 2016-11-09 DIAGNOSIS — E785 Hyperlipidemia, unspecified: Secondary | ICD-10-CM | POA: Diagnosis not present

## 2016-11-09 DIAGNOSIS — G9341 Metabolic encephalopathy: Secondary | ICD-10-CM | POA: Diagnosis not present

## 2016-11-09 DIAGNOSIS — N3000 Acute cystitis without hematuria: Secondary | ICD-10-CM | POA: Diagnosis not present

## 2016-11-09 DIAGNOSIS — I700 Atherosclerosis of aorta: Secondary | ICD-10-CM | POA: Diagnosis not present

## 2016-11-09 DIAGNOSIS — F419 Anxiety disorder, unspecified: Secondary | ICD-10-CM | POA: Diagnosis not present

## 2016-11-09 DIAGNOSIS — I10 Essential (primary) hypertension: Secondary | ICD-10-CM | POA: Diagnosis not present

## 2016-11-09 DIAGNOSIS — R5081 Fever presenting with conditions classified elsewhere: Secondary | ICD-10-CM | POA: Diagnosis not present

## 2016-11-09 DIAGNOSIS — K219 Gastro-esophageal reflux disease without esophagitis: Secondary | ICD-10-CM | POA: Diagnosis not present

## 2016-11-09 DIAGNOSIS — J410 Simple chronic bronchitis: Secondary | ICD-10-CM | POA: Diagnosis not present

## 2016-11-09 DIAGNOSIS — R0602 Shortness of breath: Secondary | ICD-10-CM | POA: Diagnosis not present

## 2016-11-09 DIAGNOSIS — B9689 Other specified bacterial agents as the cause of diseases classified elsewhere: Secondary | ICD-10-CM | POA: Diagnosis not present

## 2016-11-09 DIAGNOSIS — F17210 Nicotine dependence, cigarettes, uncomplicated: Secondary | ICD-10-CM | POA: Diagnosis not present

## 2016-11-09 DIAGNOSIS — J449 Chronic obstructive pulmonary disease, unspecified: Secondary | ICD-10-CM | POA: Insufficient documentation

## 2016-11-09 DIAGNOSIS — E1169 Type 2 diabetes mellitus with other specified complication: Secondary | ICD-10-CM | POA: Insufficient documentation

## 2016-11-09 DIAGNOSIS — N390 Urinary tract infection, site not specified: Secondary | ICD-10-CM | POA: Diagnosis not present

## 2016-11-09 DIAGNOSIS — E119 Type 2 diabetes mellitus without complications: Secondary | ICD-10-CM | POA: Diagnosis not present

## 2016-11-09 DIAGNOSIS — E871 Hypo-osmolality and hyponatremia: Secondary | ICD-10-CM | POA: Diagnosis not present

## 2016-11-10 DIAGNOSIS — N3000 Acute cystitis without hematuria: Secondary | ICD-10-CM | POA: Diagnosis not present

## 2016-11-10 DIAGNOSIS — I10 Essential (primary) hypertension: Secondary | ICD-10-CM | POA: Diagnosis not present

## 2016-11-13 ENCOUNTER — Encounter: Payer: Self-pay | Admitting: Nurse Practitioner

## 2016-11-13 ENCOUNTER — Ambulatory Visit (INDEPENDENT_AMBULATORY_CARE_PROVIDER_SITE_OTHER): Payer: Medicare HMO | Admitting: Nurse Practitioner

## 2016-11-13 VITALS — BP 142/73 | HR 74 | Temp 97.0°F | Ht 71.0 in | Wt 215.0 lb

## 2016-11-13 DIAGNOSIS — Z87440 Personal history of urinary (tract) infections: Secondary | ICD-10-CM

## 2016-11-13 DIAGNOSIS — Z09 Encounter for follow-up examination after completed treatment for conditions other than malignant neoplasm: Secondary | ICD-10-CM

## 2016-11-13 LAB — URINE CULTURE

## 2016-11-13 NOTE — Patient Instructions (Signed)
Acute Urinary Retention, Female Urinary retention means you are unable to pee completely or at all (empty your bladder). Follow these instructions at home:  Drink enough fluids to keep your pee (urine) clear or pale yellow.  If you are sent home with a tube that drains the bladder (catheter), there will be a drainage bag attached to it. There are two types of bags. One is big that you can wear at night without having to empty it. One is smaller and needs to be emptied more often.  Keep the drainage bag emptied.  Keep the drainage bag lower than the tube.  Only take medicine as told by your doctor. Contact a doctor if:  You have a low-grade fever.  You have spasms or you are leaking pee when you have spasms. Get help right away if:  You have chills or a fever.  Your catheter stops draining pee.  Your catheter falls out.  You have increased bleeding that does not stop after you have rested and increased the amount of fluids you had been drinking. This information is not intended to replace advice given to you by your health care provider. Make sure you discuss any questions you have with your health care provider. Document Released: 12/04/2007 Document Revised: 11/23/2015 Document Reviewed: 11/26/2012 Elsevier Interactive Patient Education  2017 Elsevier Inc.  

## 2016-11-13 NOTE — Progress Notes (Signed)
   Subjective:    Patient ID: Caitlin Mccarthy, female    DOB: 03-28-1936, 81 y.o.   MRN: [id number]  HPI Patient comes in today for hospital follow up. SHe was on vacation with her family and woke up on 11/09/16 with generalized weakness and unsteady gait. Her family took her to the emergency room- she was placed in obsetrvation over night and was given IV rocephin for UTI. Was discharged in less then 24 hours. She is doing well today- no c/o of urinary symptoms.    Review of Systems  Constitutional: Negative.   Respiratory: Negative.   Cardiovascular: Negative.   Genitourinary: Positive for dysuria, flank pain, frequency and urgency.  Neurological: Negative.   Psychiatric/Behavioral: Negative.   All other systems reviewed and are negative.      Objective:   Physical Exam  Constitutional: She is oriented to person, place, and time. She appears well-developed and well-nourished. No distress.  HENT:  Right Ear: External ear normal.  Left Ear: External ear normal.  Nose: Nose normal.  Mouth/Throat: Oropharynx is clear and moist.  Eyes: Pupils are equal, round, and reactive to light.  Neck: Normal range of motion. Neck supple.  Cardiovascular: Normal rate and regular rhythm.   Pulmonary/Chest: Effort normal and breath sounds normal.  Abdominal: Soft. Bowel sounds are normal. She exhibits no distension. There is no tenderness.  Genitourinary:  Genitourinary Comments: No CVA tenderness  Neurological: She is alert and oriented to person, place, and time.  Skin: Skin is warm.  Psychiatric: She has a normal mood and affect. Her behavior is normal. Judgment and thought content normal.   BP (!) 142/73   Pulse 74   Temp 97 F (36.1 C) (Oral)   Ht 5\' 11"  (1.803 m)   Wt 215 lb (97.5 kg)   BMI 29.99 kg/m         Assessment & Plan:   1. Hospital discharge follow-up   2. Recent urinary tract infection    Hospital records reviewed Force fluids RTO prn  Mary-Margaret Hassell Done,  FNP

## 2016-11-15 DIAGNOSIS — N3020 Other chronic cystitis without hematuria: Secondary | ICD-10-CM | POA: Diagnosis not present

## 2016-12-06 DIAGNOSIS — R300 Dysuria: Secondary | ICD-10-CM | POA: Diagnosis not present

## 2016-12-10 DIAGNOSIS — Z85828 Personal history of other malignant neoplasm of skin: Secondary | ICD-10-CM | POA: Diagnosis not present

## 2016-12-10 DIAGNOSIS — R21 Rash and other nonspecific skin eruption: Secondary | ICD-10-CM | POA: Diagnosis not present

## 2016-12-10 DIAGNOSIS — D692 Other nonthrombocytopenic purpura: Secondary | ICD-10-CM | POA: Diagnosis not present

## 2016-12-12 DIAGNOSIS — H02825 Cysts of left lower eyelid: Secondary | ICD-10-CM | POA: Diagnosis not present

## 2016-12-18 DIAGNOSIS — N200 Calculus of kidney: Secondary | ICD-10-CM | POA: Diagnosis not present

## 2016-12-18 DIAGNOSIS — N3020 Other chronic cystitis without hematuria: Secondary | ICD-10-CM | POA: Diagnosis not present

## 2016-12-20 ENCOUNTER — Encounter (HOSPITAL_BASED_OUTPATIENT_CLINIC_OR_DEPARTMENT_OTHER): Payer: Self-pay | Admitting: *Deleted

## 2016-12-20 ENCOUNTER — Other Ambulatory Visit: Payer: Self-pay | Admitting: Urology

## 2016-12-20 NOTE — Progress Notes (Signed)
SPOKE W/ PT DAUGHTER,  MISTY.  PT CAN BE COFUSED AT TIMES.  NPO AFTER MN.  ARRIVE AT 0800.  NEEDS ISTAT 8 AND EKG.  WILL TAKE PRILOSEC AM DOS W/ SIPS OF WATER AND IF NEEDED TAKE TRAMADOL.

## 2016-12-23 NOTE — H&P (Signed)
CC: I have kidney stones.  HPI: Caitlin Mccarthy is a 81 year-old female established patient who is here for renal calculi.    Caitlin Mccarthy returns today in f/u. She had left ESWL in 10/17 and cleared the treated stone but had residual renal stones. She had a KUB and renal US today. She was seen on 6/8 with a UTI and was initially given doxycycline. She was switched to Augmentin based on the culture and then was given suppression with ampicillin. She was previously on TMP. She has some symptoms of a yeast infection now. She has no flank pain, hematuria or fever. KUB and renal US today show a new left mid renal stone without obstruction. It is 9mm in size. She has no other associated signs or symptoms.      ALLERGIES: No Allergies    MEDICATIONS: Metformin Hcl  Amoxicillin 250 mg capsule 1 capsule PO Q HS  Aspirin Ec 81 mg tablet, delayed release Oral  Lotrel 10 mg-40 mg capsule Oral  Premarin 0.625 mg/gram cream with applicator 1/2" QHS X 14 than twice weekly  Rosuvastatin Calcium 20 mg tablet Oral  Urogesic-Blue 81.6 mg-40.8 mg-10.8 mg-0.12 mg tablet 1 tablet PO TID PRN     GU PSH: Catheterization For Collection Of Specimen, Single Patient, All Places Of Service - 11/15/2016, 03/26/2016 Catheterize For Residual - 03/26/2016 Cysto Uretero Lithotripsy - 04/28/2015 ESWL - 04/01/2016 Remove Renal Tube W/fluoro - 04/28/2015    NON-GU PSH: Hip Arthroscopy/surgery Knee Arthroscopy    GU PMH: Gross hematuria - 03/14/2016 Chronic cystitis (w/o hematuria) (Stable), She is asymptomatic on TMP but had a breakthrough in May with some symptoms and Klebsiella on culture. - 12/25/2015 Postmenopausal atrophic vaginitis, With her recurrent infections, I think she will benefit from topical estrogen. - 12/25/2015 Renal calculus (Stable), She is having no flank pain or hematuria. - 12/25/2015, Nephrolithiasis, - 08/23/2015 Urinary Tract Inf, Unspec site, Urinary tract infection - 11/14/2015 Dysuria, Dysuria -  11/08/2015 Personal Hx Urinary Tract Infections, History of recurrent UTIs - 1/70/0174 Renal colic, Renal colic - 9/44/9675 Ureteral calculus, Calculus of distal right ureter - 05/04/2015    NON-GU PMH: Encounter for general adult medical examination without abnormal findings, Encounter for preventive health examination - 07/17/2015 Personal history of other infectious and parasitic diseases, History of sepsis - 05/04/2015 Anxiety, Anxiety - 30-Apr-2015 Personal history of other diseases of the circulatory system, History of hypertension - 04/30/15 Personal history of other diseases of the musculoskeletal system and connective tissue, History of arthritis - 04/30/15 Personal history of other endocrine, nutritional and metabolic disease, History of hypercholesterolemia - 2015-04-30 Personal history of other mental and behavioral disorders, History of depression - 04-30-15    FAMILY HISTORY: Death - Mother, Father Deceased - Runs In Family   SOCIAL HISTORY: Marital Status: Married Current Smoking Status: Patient smokes. Has smoked since 03/01/1966. Smokes less than 1/2 pack per day.  Has never drank.  Drinks 1 caffeinated drink per day. Patient's occupation is/was Retired.    REVIEW OF SYSTEMS:    GU Review Female:   Patient reports frequent urination, hard to postpone urination, burning /pain with urination, and get up at night to urinate. Patient denies leakage of urine, stream starts and stops, trouble starting your stream, have to strain to urinate, and being pregnant.  Gastrointestinal (Upper):   Patient reports indigestion/ heartburn. Patient denies nausea.  Gastrointestinal (Lower):   Patient denies diarrhea and constipation.  Constitutional:   Patient reports night sweats and fatigue. Patient denies fever and  weight loss.  Skin:   Patient reports skin rash/ lesion and itching.   Eyes:   Patient denies blurred vision and double vision.  Ears/ Nose/ Throat:   Patient reports  sinus problems. Patient denies sore throat.  Hematologic/Lymphatic:   Patient reports easy bruising. Patient denies swollen glands.  Cardiovascular:   Patient denies leg swelling and chest pains.  Respiratory:   Patient reports shortness of breath. Patient denies cough.  Endocrine:   Patient denies excessive thirst.  Musculoskeletal:   Patient denies back pain and joint pain.  Neurological:   Patient denies headaches and dizziness.  Psychologic:   Patient denies depression and anxiety.   VITAL SIGNS:      12/18/2016 11:57 AM  Weight 208 lb / 94.35 kg  Height 70 in / 177.8 cm  BP 176/73 mmHg  Heart Rate 76 /min  BMI 29.8 kg/m   MULTI-SYSTEM PHYSICAL EXAMINATION:    Constitutional: Well-nourished. No physical deformities. Normally developed. Good grooming.  Respiratory: No labored breathing, no use of accessory muscles. CTA  Cardiovascular: Normal temperature, RRR without murmur.      PAST DATA REVIEWED:  Source Of History:  Patient  Urine Test Review:   Urinalysis, Urine Culture   PROCEDURES:         KUB - 83151  A single view of the abdomen is obtained. There is a new 43mm left mid renal stone and stable 42mm LMP and LLP stones. No ureteral stones are seen. There is stable lumbar degenerative disease. No gas or soft tissue abnormalities are noted.                Renal Ultrasound - [id number]  Right Kidney: Length:12.7 cm Depth: 5.8 cm Cortical Width: 0.9 cm Width:5.1 cm  Left Kidney: Length:11.7 cm Depth:6.1 cm Cortical Width:1.5 cm Width: 5.8 cm  Left Kidney/Ureter:  Multiple calcifications with the largest measuring 1.1 cm near renal pelvis.   Right Kidney/Ureter:  Multiple subcentimeter echogenic foci with the largest measuring 0.5 cm in the upper pole ( calcifications vs vessels).   Bladder:  PVR = not visualized.       There is a new left mid renal stone as noted on KUB.         Urinalysis w/Scope Dipstick Dipstick Cont'd Micro  Color: Yellow Bilirubin: Neg  WBC/hpf: >60/hpf  Appearance: Cloudy Ketones: Neg RBC/hpf: 0 - 2/hpf  Specific Gravity: 1.020 Blood: 1+ Bacteria: Mod (26-50/hpf)  pH: 6.5 Protein: 2+ Cystals: NS (Not Seen)  Glucose: Neg Urobilinogen: 0.2 Casts: NS (Not Seen)    Nitrites: Neg Trichomonas: Not Present    Leukocyte Esterase: 3+ Mucous: Not Present      Epithelial Cells: 0 - 5/hpf      Yeast: NS (Not Seen)      Sperm: Not Present    ASSESSMENT:      ICD-10 Details  1 GU:   Chronic cystitis (w/o hematuria) - N30.20 Her Urine looks infected again today and she is on ampicillin for suppression but her recent culture wasn't sensitive to that agent. I will reculture the urine today and adjust accordingly. I have also sent a dose of diflucan because she has symptoms of a yeast infection.  2   Renal calculus - N20.0 Worsening - She has a new 36mm left mid renal stone with a laminar appearance that is probably struvite. I discussed the options and will get her set up for ureteroscopy to try to get the kidney cleaned out. I reviewed the risks of  bleeding, infection, ureteral injury, need for a stent or secondary procedures, thrombotic events and anesthetic complications. I will get her UTI treated first.    PLAN:            Medications New Meds: Fluconazole 150 mg tablet 1 tablet PO Daily   #1  1 Refill(s)            Orders Labs Urine Culture          Schedule Return Visit/Planned Activity: Next Available Appointment - Schedule Surgery             Note: She will need ureteroscopy in 2-3 weeks.    I have started her on Augmentin.

## 2016-12-24 ENCOUNTER — Encounter (HOSPITAL_BASED_OUTPATIENT_CLINIC_OR_DEPARTMENT_OTHER)
Admission: RE | Disposition: A | Discharge status: Home / Self Care | Payer: Self-pay | Source: Ambulatory Visit | Attending: Urology

## 2016-12-24 ENCOUNTER — Other Ambulatory Visit: Payer: Self-pay

## 2016-12-24 ENCOUNTER — Encounter (HOSPITAL_BASED_OUTPATIENT_CLINIC_OR_DEPARTMENT_OTHER): Payer: Self-pay | Admitting: Certified Registered"

## 2016-12-24 ENCOUNTER — Ambulatory Visit (HOSPITAL_BASED_OUTPATIENT_CLINIC_OR_DEPARTMENT_OTHER): Payer: Medicare HMO | Admitting: Anesthesiology

## 2016-12-24 ENCOUNTER — Ambulatory Visit (HOSPITAL_BASED_OUTPATIENT_CLINIC_OR_DEPARTMENT_OTHER)
Admission: RE | Admit: 2016-12-24 | Discharge: 2016-12-24 | Disposition: A | Discharge status: Home / Self Care | Payer: Medicare HMO | Source: Ambulatory Visit | Attending: Urology | Admitting: Urology

## 2016-12-24 DIAGNOSIS — Z7984 Long term (current) use of oral hypoglycemic drugs: Secondary | ICD-10-CM | POA: Insufficient documentation

## 2016-12-24 DIAGNOSIS — Z79890 Hormone replacement therapy (postmenopausal): Secondary | ICD-10-CM | POA: Insufficient documentation

## 2016-12-24 DIAGNOSIS — N200 Calculus of kidney: Secondary | ICD-10-CM | POA: Diagnosis not present

## 2016-12-24 DIAGNOSIS — I10 Essential (primary) hypertension: Secondary | ICD-10-CM | POA: Insufficient documentation

## 2016-12-24 DIAGNOSIS — Z79899 Other long term (current) drug therapy: Secondary | ICD-10-CM | POA: Insufficient documentation

## 2016-12-24 DIAGNOSIS — Z7982 Long term (current) use of aspirin: Secondary | ICD-10-CM | POA: Insufficient documentation

## 2016-12-24 DIAGNOSIS — E785 Hyperlipidemia, unspecified: Secondary | ICD-10-CM | POA: Diagnosis not present

## 2016-12-24 DIAGNOSIS — N3020 Other chronic cystitis without hematuria: Secondary | ICD-10-CM | POA: Diagnosis not present

## 2016-12-24 DIAGNOSIS — E7800 Pure hypercholesterolemia, unspecified: Secondary | ICD-10-CM | POA: Insufficient documentation

## 2016-12-24 DIAGNOSIS — E559 Vitamin D deficiency, unspecified: Secondary | ICD-10-CM | POA: Diagnosis not present

## 2016-12-24 DIAGNOSIS — F17210 Nicotine dependence, cigarettes, uncomplicated: Secondary | ICD-10-CM | POA: Insufficient documentation

## 2016-12-24 DIAGNOSIS — E119 Type 2 diabetes mellitus without complications: Secondary | ICD-10-CM | POA: Diagnosis not present

## 2016-12-24 HISTORY — DX: Other amnesia: R41.3

## 2016-12-24 HISTORY — DX: Calculus of kidney: N20.0

## 2016-12-24 HISTORY — DX: Personal history of urinary (tract) infections: Z87.440

## 2016-12-24 HISTORY — DX: Type 2 diabetes mellitus without complications: E11.9

## 2016-12-24 HISTORY — PX: CYSTOSCOPY/URETEROSCOPY/HOLMIUM LASER/STENT PLACEMENT: SHX6546

## 2016-12-24 HISTORY — DX: Unspecified osteoarthritis, unspecified site: M19.90

## 2016-12-24 LAB — POCT I-STAT, CHEM 8
BUN: 13 mg/dL (ref 6–20)
CALCIUM ION: 1.41 mmol/L — AB (ref 1.15–1.40)
Chloride: 105 mmol/L (ref 101–111)
Creatinine, Ser: 0.5 mg/dL (ref 0.44–1.00)
Glucose, Bld: 187 mg/dL — ABNORMAL HIGH (ref 65–99)
HEMATOCRIT: 40 % (ref 36.0–46.0)
Hemoglobin: 13.6 g/dL (ref 12.0–15.0)
POTASSIUM: 4.3 mmol/L (ref 3.5–5.1)
SODIUM: 140 mmol/L (ref 135–145)
TCO2: 27 mmol/L (ref 0–100)

## 2016-12-24 SURGERY — CYSTOSCOPY/URETEROSCOPY/HOLMIUM LASER/STENT PLACEMENT
Anesthesia: General | Laterality: Left

## 2016-12-24 MED ORDER — PROPOFOL 10 MG/ML IV BOLUS
INTRAVENOUS | Status: DC | PRN
  Administered 2016-12-24: 150 mg via INTRAVENOUS

## 2016-12-24 MED ORDER — ONDANSETRON HCL 4 MG/2ML IJ SOLN
INTRAMUSCULAR | Status: AC
  Filled 2016-12-24: qty 2

## 2016-12-24 MED ORDER — ONDANSETRON HCL 4 MG/2ML IJ SOLN
INTRAMUSCULAR | Status: DC | PRN
  Administered 2016-12-24: 4 mg via INTRAVENOUS

## 2016-12-24 MED ORDER — ACETAMINOPHEN 650 MG RE SUPP
650.0000 mg | RECTAL | Status: DC | PRN
  Filled 2016-12-24: qty 1

## 2016-12-24 MED ORDER — LACTATED RINGERS IV SOLN
INTRAVENOUS | Status: DC
  Administered 2016-12-24: 09:00:00 via INTRAVENOUS
  Filled 2016-12-24: qty 1000

## 2016-12-24 MED ORDER — CEFAZOLIN SODIUM-DEXTROSE 2-4 GM/100ML-% IV SOLN
2.0000 g | INTRAVENOUS | Status: AC
  Administered 2016-12-24: 2 g via INTRAVENOUS
  Filled 2016-12-24: qty 100

## 2016-12-24 MED ORDER — FENTANYL CITRATE (PF) 100 MCG/2ML IJ SOLN
INTRAMUSCULAR | Status: DC | PRN
  Administered 2016-12-24: 50 ug via INTRAVENOUS
  Administered 2016-12-24 (×2): 25 ug via INTRAVENOUS

## 2016-12-24 MED ORDER — SODIUM CHLORIDE 0.9 % IV SOLN
250.0000 mL | INTRAVENOUS | Status: DC | PRN
  Filled 2016-12-24: qty 250

## 2016-12-24 MED ORDER — LIDOCAINE 2% (20 MG/ML) 5 ML SYRINGE
INTRAMUSCULAR | Status: DC | PRN
  Administered 2016-12-24: 100 mg via INTRAVENOUS

## 2016-12-24 MED ORDER — FENTANYL CITRATE (PF) 100 MCG/2ML IJ SOLN
INTRAMUSCULAR | Status: AC
  Filled 2016-12-24: qty 2

## 2016-12-24 MED ORDER — FENTANYL CITRATE (PF) 100 MCG/2ML IJ SOLN
25.0000 ug | INTRAMUSCULAR | Status: DC | PRN
  Filled 2016-12-24: qty 1

## 2016-12-24 MED ORDER — OXYCODONE HCL 5 MG PO TABS
5.0000 mg | ORAL_TABLET | ORAL | Status: DC | PRN
  Administered 2016-12-24: 5 mg via ORAL
  Filled 2016-12-24: qty 2

## 2016-12-24 MED ORDER — SODIUM CHLORIDE 0.9% FLUSH
3.0000 mL | Freq: Two times a day (BID) | INTRAVENOUS | Status: DC
  Filled 2016-12-24: qty 3

## 2016-12-24 MED ORDER — HYDROCODONE-ACETAMINOPHEN 5-325 MG PO TABS: 1.0000 | ORAL_TABLET | Freq: Four times a day (QID) | ORAL | 0 refills | Status: DC | PRN

## 2016-12-24 MED ORDER — PROPOFOL 10 MG/ML IV BOLUS
INTRAVENOUS | Status: AC
  Filled 2016-12-24: qty 20

## 2016-12-24 MED ORDER — AMOXICILLIN-POT CLAVULANATE 500-125 MG PO TABS: 1.0000 | ORAL_TABLET | Freq: Every day | ORAL | 2 refills | Status: DC

## 2016-12-24 MED ORDER — CEFAZOLIN SODIUM-DEXTROSE 2-4 GM/100ML-% IV SOLN
INTRAVENOUS | Status: AC
  Filled 2016-12-24: qty 100

## 2016-12-24 MED ORDER — OXYCODONE HCL 5 MG PO TABS
ORAL_TABLET | ORAL | Status: AC
  Filled 2016-12-24: qty 1

## 2016-12-24 MED ORDER — GENTAMICIN SULFATE 40 MG/ML IJ SOLN
5.0000 mg/kg | INTRAVENOUS | Status: DC
  Filled 2016-12-24: qty 12.25

## 2016-12-24 MED ORDER — GENTAMICIN SULFATE 40 MG/ML IJ SOLN
5.0000 mg/kg | Freq: Once | INTRAMUSCULAR | Status: AC
  Administered 2016-12-24: 410 mg via INTRAVENOUS
  Filled 2016-12-24 (×2): qty 10.25

## 2016-12-24 MED ORDER — ACETAMINOPHEN 325 MG PO TABS
650.0000 mg | ORAL_TABLET | ORAL | Status: DC | PRN
  Filled 2016-12-24: qty 2

## 2016-12-24 MED ORDER — MORPHINE SULFATE (PF) 2 MG/ML IV SOLN
2.0000 mg | INTRAVENOUS | Status: DC | PRN
  Filled 2016-12-24: qty 1

## 2016-12-24 MED ORDER — SODIUM CHLORIDE 0.9% FLUSH
3.0000 mL | INTRAVENOUS | Status: DC | PRN
  Filled 2016-12-24: qty 3

## 2016-12-24 MED ORDER — LIDOCAINE 2% (20 MG/ML) 5 ML SYRINGE
INTRAMUSCULAR | Status: AC
Start: 2016-12-24 — End: ?
  Filled 2016-12-24: qty 5

## 2016-12-24 MED FILL — HYDROCODON-APAP 5-325: 5-325 | 3 days supply | Qty: 12 | Fill #0

## 2016-12-24 SURGICAL SUPPLY — 39 items
BAG DRAIN URO-CYSTO SKYTR STRL (DRAIN) ×3 IMPLANT
BAG DRN UROCATH (DRAIN) ×1
BASKET LASER NITINOL 1.9FR (BASKET) IMPLANT
BASKET STONE NCOMPASS (UROLOGICAL SUPPLIES) ×2 IMPLANT
BASKET ZERO TIP NITINOL 2.4FR (BASKET) IMPLANT
BSKT STON RTRVL 120 1.9FR (BASKET)
BSKT STON RTRVL ZERO TP 2.4FR (BASKET)
CATH URET 5FR 28IN CONE TIP (BALLOONS)
CATH URET 5FR 28IN OPEN ENDED (CATHETERS) IMPLANT
CATH URET 5FR 70CM CONE TIP (BALLOONS) IMPLANT
CLOTH BEACON ORANGE TIMEOUT ST (SAFETY) ×3 IMPLANT
ELECT REM PT RETURN 9FT ADLT (ELECTROSURGICAL)
ELECTRODE REM PT RTRN 9FT ADLT (ELECTROSURGICAL) IMPLANT
EXTRACTOR STONE NITINOL NGAGE (UROLOGICAL SUPPLIES) ×2 IMPLANT
FIBER LASER FLEXIVA 1000 (UROLOGICAL SUPPLIES) IMPLANT
FIBER LASER FLEXIVA 200 (UROLOGICAL SUPPLIES) ×2 IMPLANT
FIBER LASER FLEXIVA 365 (UROLOGICAL SUPPLIES) IMPLANT
FIBER LASER FLEXIVA 550 (UROLOGICAL SUPPLIES) IMPLANT
FIBER LASER TRAC TIP (UROLOGICAL SUPPLIES) IMPLANT
GLOVE SURG SS PI 8.0 STRL IVOR (GLOVE) ×3 IMPLANT
GOWN STRL REUS W/ TWL LRG LVL3 (GOWN DISPOSABLE) IMPLANT
GOWN STRL REUS W/ TWL XL LVL3 (GOWN DISPOSABLE) ×1 IMPLANT
GOWN STRL REUS W/TWL LRG LVL3 (GOWN DISPOSABLE)
GOWN STRL REUS W/TWL XL LVL3 (GOWN DISPOSABLE) ×3
GUIDEWIRE 0.038 PTFE COATED (WIRE) IMPLANT
GUIDEWIRE ANG ZIPWIRE 038X150 (WIRE) IMPLANT
GUIDEWIRE STR DUAL SENSOR (WIRE) ×3 IMPLANT
IV NS IRRIG 3000ML ARTHROMATIC (IV SOLUTION) ×3 IMPLANT
KIT BALLIN UROMAX 15FX10 (LABEL) IMPLANT
KIT BALLN UROMAX 15FX4 (MISCELLANEOUS) IMPLANT
KIT BALLN UROMAX 26 75X4 (MISCELLANEOUS)
KIT RM TURNOVER CYSTO AR (KITS) ×3 IMPLANT
MANIFOLD NEPTUNE II (INSTRUMENTS) ×2 IMPLANT
PACK CYSTO (CUSTOM PROCEDURE TRAY) ×3 IMPLANT
SET HIGH PRES BAL DIL (LABEL)
SHEATH ACCESS URETERAL 38CM (SHEATH) ×2 IMPLANT
STENT URET 6FRX26 CONTOUR (STENTS) ×2 IMPLANT
TUBE CONNECTING 12'X1/4 (SUCTIONS)
TUBE CONNECTING 12X1/4 (SUCTIONS) IMPLANT

## 2016-12-24 NOTE — Discharge Instructions (Signed)
°Post Anesthesia Home Care Instructions ° °Activity: °Get plenty of rest for the remainder of the day. A responsible individual must stay with you for 24 hours following the procedure.  °For the next 24 hours, DO NOT: °-Drive a car °-Operate machinery °-Drink alcoholic beverages °-Take any medication unless instructed by your physician °-Make any legal decisions or sign important papers. ° °Meals: °Start with liquid foods such as gelatin or soup. Progress to regular foods as tolerated. Avoid greasy, spicy, heavy foods. If nausea and/or vomiting occur, drink only clear liquids until the nausea and/or vomiting subsides. Call your physician if vomiting continues. ° °Special Instructions/Symptoms: °Your throat may feel dry or sore from the anesthesia or the breathing tube placed in your throat during surgery. If this causes discomfort, gargle with warm salt water. The discomfort should disappear within 24 hours. ° °If you had a scopolamine patch placed behind your ear for the management of post- operative nausea and/or vomiting: ° °1. The medication in the patch is effective for 72 hours, after which it should be removed.  Wrap patch in a tissue and discard in the trash. Wash hands thoroughly with soap and water. °2. You may remove the patch earlier than 72 hours if you experience unpleasant side effects which may include dry mouth, dizziness or visual disturbances. °3. Avoid touching the patch. Wash your hands with soap and water after contact with the patch. °  °Ureteral Stent Implantation, Care After °Refer to this sheet in the next few weeks. These instructions provide you with information about caring for yourself after your procedure. Your health care provider may also give you more specific instructions. Your treatment has been planned according to current medical practices, but problems sometimes occur. Call your health care provider if you have any problems or questions after your procedure. °What can I expect  after the procedure? °After the procedure, it is common to have: °· Nausea. °· Mild pain when you urinate. You may feel this pain in your lower back or lower abdomen. Pain should stop within a few minutes after you urinate. This may last for up to 1 week. °· A small amount of blood in your urine for several days. ° °Follow these instructions at home: ° °Medicines °· Take over-the-counter and prescription medicines only as told by your health care provider. °· If you were prescribed an antibiotic medicine, take it as told by your health care provider. Do not stop taking the antibiotic even if you start to feel better. °· Do not drive for 24 hours if you received a sedative. °· Do not drive or operate heavy machinery while taking prescription pain medicines. °Activity °· Return to your normal activities as told by your health care provider. Ask your health care provider what activities are safe for you. °· Do not lift anything that is heavier than 10 lb (4.5 kg). Follow this limit for 1 week after your procedure, or for as long as told by your health care provider. °General instructions °· Watch for any blood in your urine. Call your health care provider if the amount of blood in your urine increases. °· If you have a catheter: °? Follow instructions from your health care provider about taking care of your catheter and collection bag. °? Do not take baths, swim, or use a hot tub until your health care provider approves. °· Drink enough fluid to keep your urine clear or pale yellow. °· Keep all follow-up visits as told by your health care provider.   This is important. °Contact a health care provider if: °· You have pain that gets worse or does not get better with medicine, especially pain when you urinate. °· You have difficulty urinating. °· You feel nauseous or you vomit repeatedly during a period of more than 2 days after the procedure. °Get help right away if: °· Your urine is dark red or has blood clots in  it. °· You are leaking urine (have incontinence). °· The end of the stent comes out of your urethra. °· You cannot urinate. °· You have sudden, sharp, or severe pain in your abdomen or lower back. °· You have a fever. °This information is not intended to replace advice given to you by your health care provider. Make sure you discuss any questions you have with your health care provider. °Document Released: 02/17/2013 Document Revised: 11/23/2015 Document Reviewed: 12/30/2014 °Elsevier Interactive Patient Education © 2018 [employer] ° °

## 2016-12-24 NOTE — Brief Op Note (Signed)
12/24/2016  10:59 AM  PATIENT:  [name]  81 y.o. female  PRE-OPERATIVE DIAGNOSIS:  LEFT RENAL STONES  POST-OPERATIVE DIAGNOSIS:  LEFT RENAL STONES  PROCEDURE:  Procedure(s): CYSTOSCOPY WITH LEFT RETROGRADE LEFT URETEROSCOPY WITH HOLMIUM LASER AND STENT PLACEMENT (Left)  SURGEON:  Surgeon(s) and Role:    Irine Seal, MD - Primary  PHYSICIAN ASSISTANT:   ASSISTANTS: none   ANESTHESIA:   general  EBL:  Total I/O In: 200 [I.V.:200] Out: -   BLOOD ADMINISTERED:none  DRAINS: left 6 x 26 JJ with tether   LOCAL MEDICATIONS USED:  NONE  SPECIMEN:  Source of Specimen:  left renal stone fragments  DISPOSITION OF SPECIMEN:  PATHOLOGY  COUNTS:  YES  TOURNIQUET:  * No tourniquets in log *  DICTATION: .Other Dictation: Dictation Number [id number]  PLAN OF CARE: Discharge to home after PACU  PATIENT DISPOSITION:  PACU - hemodynamically stable.   Delay start of Pharmacological VTE agent (>24hrs) due to surgical blood loss or risk of bleeding: not applicable

## 2016-12-24 NOTE — Anesthesia Preprocedure Evaluation (Signed)
Anesthesia Evaluation  Patient identified by MRN, date of birth, ID band Patient awake    Reviewed: Allergy & Precautions, NPO status , Patient's Chart, lab work & pertinent test results  History of Anesthesia Complications Negative for: history of anesthetic complications  Airway Mallampati: II   Neck ROM: Full    Dental  (+) Teeth Intact, Caps, Dental Advisory Given   Pulmonary Current Smoker,    breath sounds clear to auscultation       Cardiovascular hypertension,  Rhythm:Regular Rate:Normal     Neuro/Psych    GI/Hepatic GERD  ,  Endo/Other  diabetes  Renal/GU Renal disease     Musculoskeletal   Abdominal   Peds  Hematology   Anesthesia Other Findings   Reproductive/Obstetrics                             Anesthesia Physical Anesthesia Plan  ASA: III  Anesthesia Plan: General   Post-op Pain Management:    Induction: Intravenous  PONV Risk Score and Plan: 3 and Ondansetron, Dexamethasone, Propofol and Midazolam  Airway Management Planned: LMA  Additional Equipment:   Intra-op Plan:   Post-operative Plan: Extubation in OR  Informed Consent: I have reviewed the patients History and Physical, chart, labs and discussed the procedure including the risks, benefits and alternatives for the proposed anesthesia with the patient or authorized representative who has indicated his/her understanding and acceptance.   Dental advisory given  Plan Discussed with: CRNA  Anesthesia Plan Comments:         Anesthesia Quick Evaluation

## 2016-12-24 NOTE — Transfer of Care (Signed)
Immediate Anesthesia Transfer of Care Note  Patient: Caitlin Mccarthy  Procedure(s) Performed: Procedure(s) (LRB): LEFT URETEROSCOPY WITH HOLMIUM LASER AND STENT PLACEMENT (Left)  Patient Location: PACU  Anesthesia Type: General  Level of Consciousness: awake, oriented, sedated and patient cooperative  Airway & Oxygen Therapy: Patient Spontanous Breathing and Patient connected to face mask oxygen  Post-op Assessment: Report given to PACU RN and Post -op Vital signs reviewed and stable  Post vital signs: Reviewed and stable  Complications: No apparent anesthesia complications Last Vitals:  Vitals:   12/24/16 0818 12/24/16 1108  BP: 134/79   Pulse: 76 80  Resp: 18 18  Temp: 36.4 C 36.4 C    Last Pain:  Vitals:   12/24/16 0818  TempSrc: Oral

## 2016-12-24 NOTE — Op Note (Signed)
NAME:  Caitlin Mccarthy, Caitlin Mccarthy                  ACCOUNT NO.:  MEDICAL RECORD NO.:  [id number]  LOCATION:                                 FACILITY:  PHYSICIAN:  Marshall Cork. Jeffie Pollock, M.D.         DATE OF BIRTH:  DATE OF PROCEDURE:  12/24/2016 DATE OF DISCHARGE:                              OPERATIVE REPORT   PROCEDURE: 1. Cystoscopy with left retrograde pyelogram and interpretation. 2. Left ureteroscopy with holmium laser lithotripsy, stone extraction,     insertion of left double-J stent.  PREOPERATIVE DIAGNOSIS:  Left renal stones with recurrent infection.  POSTOPERATIVE DIAGNOSIS:  Left renal stones with recurrent infection.  SURGEON:  Marshall Cork. Jeffie Pollock, M.D.  ANESTHESIA:  General.  SPECIMEN:  Stone fragments.  DRAINS:  A 6-French 26 cm Contour double-J stent on the left with string.  BLOOD LOSS:  None.  COMPLICATIONS:  None.  SPECIMEN:  Stone fragments.  A portion sent to the lab and a portion given to the patient.  INDICATIONS:  [name] is a 81 year old white female with recurrent urinary tract infections with a persistent organism.  She had 2 left lower pole renal calculi, but on recent imaging had a new approximately 9 mm right renal pelvic stone.  It was felt that her infections were potentially related to the stones and that stone removal was indicated.  FINDINGS AND PROCEDURE:  She was taken to the operating room, where general anesthetic was induced.  She was given Ancef and gentamicin, but had been on Augmentin preoperatively here.  She was placed in lithotomy position and was fitted with PAS hose.  Her perineum and genitalia were prepped with Betadine solution, she was draped in the usual sterile fashion.  A cystoscopy was performed using a 23-French scope and 30-degree lens. Examination revealed a normal urethra.  The bladder wall had mild trabeculation.  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  The ureteral orifices were in the normal  anatomic position. No tumors or stones were noted.  Her left ureteral orifice was cannulated with 5-French open-end catheter and contrast was instilled.  Left retrograde grade pyelogram revealed a normal ureter.  The intrarenal collecting system was nonobstructed.  There was a filling defect in the renal pelvis consistent with her known stone.  She had some infundibular narrowing in the lower pole area.  Once retrograde pyelogram was completed, guidewire was passed to the kidney and a 38 cm digital access sheath 12-French inner core was passed over the wire without difficulty into the kidney.  The assembled sheath was then passed over the wire to the kidney.  The inner core and wire were then removed.  A dual-lumen digital flexible scope was passed through the access sheath and several small stones were identified and were removed using an N- [employer]  There were 2 larger stones that required lithotripsy.  The 200-micron TracTip laser fiber was passed, it was initially set on 0.5 watts and 10 hertz, but eventually increased to 1 watt and 45 hertz. The stones were sequentially broken into manageable fragments which were then removed using a N-Gage basket and then smaller fragments were obtained in the  N[employer]ass basket.  At the end of the procedure, all stones larger than approximately 2 mm had been removed, but there were several fragments that I was unable to remove due to their small size.  At this point, a guidewire was inserted to the kidney.  The access sheath was removed.  The cystoscope was reinserted over the wire and a 6- French 26 cm Contour double-J stent with tether was passed to the kidney under fluoroscopic guidance without difficulty.  The wire was removed leaving good coil in the kidney and a good coil in the bladder.  The bladder was then drained.  The cystoscope was removed.  The stent string was tied close to the meatus, trimmed and tucked vaginally.   She was then taken down from lithotomy position.  Her anesthetic was reversed.  She was moved to the recovery room in stable condition. There were no complications.     Marshall Cork. Jeffie Pollock, M.D.     JJW/MEDQ  D:  12/24/2016  T:  12/24/2016  Job:  [id number]

## 2016-12-24 NOTE — Anesthesia Procedure Notes (Signed)
Procedure Name: LMA Insertion Date/Time: 12/24/2016 10:05 AM Performed by: Denna Haggard D Pre-anesthesia Checklist: Patient identified, Emergency Drugs available, Suction available and Patient being monitored Patient Re-evaluated:Patient Re-evaluated prior to inductionOxygen Delivery Method: Circle system utilized Preoxygenation: Pre-oxygenation with 100% oxygen Intubation Type: IV induction Ventilation: Mask ventilation without difficulty LMA: LMA inserted LMA Size: 4.0 Number of attempts: 1 Airway Equipment and Method: Bite block Placement Confirmation: positive ETCO2 Tube secured with: Tape Dental Injury: Teeth and Oropharynx as per pre-operative assessment

## 2016-12-24 NOTE — Anesthesia Postprocedure Evaluation (Signed)
Anesthesia Post Note  Patient: Caitlin Mccarthy  Procedure(s) Performed: Procedure(s) (LRB): LEFT URETEROSCOPY WITH HOLMIUM LASER AND STENT PLACEMENT (Left)     Patient location during evaluation: PACU Anesthesia Type: General Level of consciousness: awake and alert Pain management: pain level controlled Vital Signs Assessment: post-procedure vital signs reviewed and stable Respiratory status: spontaneous breathing, nonlabored ventilation, respiratory function stable and patient connected to nasal cannula oxygen Cardiovascular status: blood pressure returned to baseline and stable Postop Assessment: no signs of nausea or vomiting Anesthetic complications: no    Last Vitals:  Vitals:   12/24/16 1145 12/24/16 1200  BP: (!) 141/67 (!) 141/64  Pulse: 65 72  Resp: 14 17  Temp:      Last Pain:  Vitals:   12/24/16 1200  TempSrc:   PainSc: 0-No pain                 Sara Selvidge,JAMES TERRILL

## 2016-12-24 NOTE — Interval H&P Note (Signed)
History and Physical Interval Note:   Her culture was positive and sens to Augmentin but not ampicillin.  She was placed on Augmentin on 6/22 and has been taking it as prescribed.    12/24/2016 9:49 AM  [name]  has presented today for surgery, with the diagnosis of LEFT RENAL STONES  The various methods of treatment have been discussed with the patient and family. After consideration of risks, benefits and other options for treatment, the patient has consented to  Procedure(s): LEFT URETEROSCOPY WITH HOLMIUM LASER AND STENT PLACEMENT (Left) as a surgical intervention .  The patient's history has been reviewed, patient examined, no change in status, stable for surgery.  I have reviewed the patient's chart and labs.  Questions were answered to the patient's satisfaction.     [name]

## 2016-12-25 ENCOUNTER — Encounter (HOSPITAL_BASED_OUTPATIENT_CLINIC_OR_DEPARTMENT_OTHER): Payer: Self-pay | Admitting: Urology

## 2016-12-27 DIAGNOSIS — N201 Calculus of ureter: Secondary | ICD-10-CM | POA: Diagnosis not present

## 2016-12-31 DIAGNOSIS — N201 Calculus of ureter: Secondary | ICD-10-CM | POA: Diagnosis not present

## 2016-12-31 DIAGNOSIS — N3020 Other chronic cystitis without hematuria: Secondary | ICD-10-CM | POA: Diagnosis not present

## 2017-01-02 LAB — STONE ANALYSIS
CA OXALATE, MONOHYDR.: 55 %
Ca phos cry stone ql IR: 30 %
Magnesium Ammon Phos: 15 %
Stone Weight KSTONE: 94 mg

## 2017-01-09 ENCOUNTER — Encounter (HOSPITAL_COMMUNITY): Payer: Self-pay | Admitting: Emergency Medicine

## 2017-01-09 ENCOUNTER — Emergency Department (HOSPITAL_COMMUNITY): Payer: Medicare HMO

## 2017-01-09 ENCOUNTER — Observation Stay (HOSPITAL_COMMUNITY): Payer: Medicare HMO

## 2017-01-09 ENCOUNTER — Inpatient Hospital Stay (HOSPITAL_COMMUNITY)
Admission: EM | Admit: 2017-01-09 | Discharge: 2017-01-11 | DRG: 064 | Disposition: A | Discharge status: Home / Self Care | Payer: Medicare HMO | Attending: Internal Medicine | Admitting: Internal Medicine

## 2017-01-09 DIAGNOSIS — Z8673 Personal history of transient ischemic attack (TIA), and cerebral infarction without residual deficits: Secondary | ICD-10-CM | POA: Diagnosis present

## 2017-01-09 DIAGNOSIS — R479 Unspecified speech disturbances: Secondary | ICD-10-CM | POA: Diagnosis not present

## 2017-01-09 DIAGNOSIS — E785 Hyperlipidemia, unspecified: Secondary | ICD-10-CM | POA: Diagnosis present

## 2017-01-09 DIAGNOSIS — E781 Pure hyperglyceridemia: Secondary | ICD-10-CM | POA: Diagnosis present

## 2017-01-09 DIAGNOSIS — Z9841 Cataract extraction status, right eye: Secondary | ICD-10-CM

## 2017-01-09 DIAGNOSIS — I4891 Unspecified atrial fibrillation: Secondary | ICD-10-CM | POA: Diagnosis present

## 2017-01-09 DIAGNOSIS — F17210 Nicotine dependence, cigarettes, uncomplicated: Secondary | ICD-10-CM | POA: Diagnosis present

## 2017-01-09 DIAGNOSIS — Z96653 Presence of artificial knee joint, bilateral: Secondary | ICD-10-CM | POA: Diagnosis present

## 2017-01-09 DIAGNOSIS — Z96641 Presence of right artificial hip joint: Secondary | ICD-10-CM | POA: Diagnosis present

## 2017-01-09 DIAGNOSIS — K219 Gastro-esophageal reflux disease without esophagitis: Secondary | ICD-10-CM | POA: Diagnosis not present

## 2017-01-09 DIAGNOSIS — J382 Nodules of vocal cords: Secondary | ICD-10-CM | POA: Diagnosis present

## 2017-01-09 DIAGNOSIS — I672 Cerebral atherosclerosis: Secondary | ICD-10-CM | POA: Diagnosis not present

## 2017-01-09 DIAGNOSIS — I360 Nonrheumatic tricuspid (valve) stenosis: Secondary | ICD-10-CM | POA: Diagnosis not present

## 2017-01-09 DIAGNOSIS — I63412 Cerebral infarction due to embolism of left middle cerebral artery: Secondary | ICD-10-CM | POA: Diagnosis not present

## 2017-01-09 DIAGNOSIS — I639 Cerebral infarction, unspecified: Secondary | ICD-10-CM | POA: Diagnosis present

## 2017-01-09 DIAGNOSIS — R29700 NIHSS score 0: Secondary | ICD-10-CM | POA: Diagnosis present

## 2017-01-09 DIAGNOSIS — Z1611 Resistance to penicillins: Secondary | ICD-10-CM | POA: Diagnosis present

## 2017-01-09 DIAGNOSIS — Z87440 Personal history of urinary (tract) infections: Secondary | ICD-10-CM

## 2017-01-09 DIAGNOSIS — G9341 Metabolic encephalopathy: Secondary | ICD-10-CM | POA: Diagnosis not present

## 2017-01-09 DIAGNOSIS — Z1624 Resistance to multiple antibiotics: Secondary | ICD-10-CM | POA: Diagnosis present

## 2017-01-09 DIAGNOSIS — Z79899 Other long term (current) drug therapy: Secondary | ICD-10-CM

## 2017-01-09 DIAGNOSIS — Z833 Family history of diabetes mellitus: Secondary | ICD-10-CM

## 2017-01-09 DIAGNOSIS — Z87442 Personal history of urinary calculi: Secondary | ICD-10-CM | POA: Diagnosis present

## 2017-01-09 DIAGNOSIS — I10 Essential (primary) hypertension: Secondary | ICD-10-CM | POA: Diagnosis not present

## 2017-01-09 DIAGNOSIS — R471 Dysarthria and anarthria: Secondary | ICD-10-CM | POA: Diagnosis not present

## 2017-01-09 DIAGNOSIS — G459 Transient cerebral ischemic attack, unspecified: Secondary | ICD-10-CM | POA: Diagnosis not present

## 2017-01-09 DIAGNOSIS — F419 Anxiety disorder, unspecified: Secondary | ICD-10-CM | POA: Diagnosis present

## 2017-01-09 DIAGNOSIS — Z7983 Long term (current) use of bisphosphonates: Secondary | ICD-10-CM

## 2017-01-09 DIAGNOSIS — M1990 Unspecified osteoarthritis, unspecified site: Secondary | ICD-10-CM | POA: Diagnosis present

## 2017-01-09 DIAGNOSIS — N390 Urinary tract infection, site not specified: Secondary | ICD-10-CM | POA: Diagnosis present

## 2017-01-09 DIAGNOSIS — Z961 Presence of intraocular lens: Secondary | ICD-10-CM | POA: Diagnosis present

## 2017-01-09 DIAGNOSIS — E119 Type 2 diabetes mellitus without complications: Secondary | ICD-10-CM | POA: Diagnosis present

## 2017-01-09 DIAGNOSIS — R4701 Aphasia: Secondary | ICD-10-CM

## 2017-01-09 DIAGNOSIS — M810 Age-related osteoporosis without current pathological fracture: Secondary | ICD-10-CM | POA: Diagnosis present

## 2017-01-09 DIAGNOSIS — I6930 Unspecified sequelae of cerebral infarction: Secondary | ICD-10-CM | POA: Diagnosis present

## 2017-01-09 LAB — PROTIME-INR
INR: 0.99
PROTHROMBIN TIME: 13 s (ref 11.4–15.2)

## 2017-01-09 LAB — COMPREHENSIVE METABOLIC PANEL
ALBUMIN: 3.9 g/dL (ref 3.5–5.0)
ALK PHOS: 65 U/L (ref 38–126)
ALT: 33 U/L (ref 14–54)
ANION GAP: 8 (ref 5–15)
AST: 46 U/L — AB (ref 15–41)
BILIRUBIN TOTAL: 0.6 mg/dL (ref 0.3–1.2)
BUN: 8 mg/dL (ref 6–20)
CO2: 24 mmol/L (ref 22–32)
Calcium: 10.4 mg/dL — ABNORMAL HIGH (ref 8.9–10.3)
Chloride: 105 mmol/L (ref 101–111)
Creatinine, Ser: 0.66 mg/dL (ref 0.44–1.00)
GFR calc Af Amer: >60 mL/min (ref >60)
GFR calc non Af Amer: >60 mL/min (ref >60)
GLUCOSE: 149 mg/dL — AB (ref 65–99)
POTASSIUM: 4.3 mmol/L (ref 3.5–5.1)
SODIUM: 137 mmol/L (ref 135–145)
TOTAL PROTEIN: 7.8 g/dL (ref 6.5–8.1)

## 2017-01-09 LAB — I-STAT CHEM 8, ED
BUN: 11 mg/dL (ref 6–20)
CHLORIDE: 104 mmol/L (ref 101–111)
CREATININE: 0.6 mg/dL (ref 0.44–1.00)
Calcium, Ion: 1.28 mmol/L (ref 1.15–1.40)
GLUCOSE: 151 mg/dL — AB (ref 65–99)
HCT: 42 % (ref 36.0–46.0)
HEMOGLOBIN: 14.3 g/dL (ref 12.0–15.0)
POTASSIUM: 4.2 mmol/L (ref 3.5–5.1)
Sodium: 139 mmol/L (ref 135–145)
TCO2: 26 mmol/L (ref 0–100)

## 2017-01-09 LAB — DIFFERENTIAL
BASOS ABS: 0 10*3/uL (ref 0.0–0.1)
Basophils Relative: 0 %
EOS ABS: 0.1 10*3/uL (ref 0.0–0.7)
EOS PCT: 2 %
LYMPHS ABS: 2.1 10*3/uL (ref 0.7–4.0)
LYMPHS PCT: 42 %
Monocytes Absolute: 0.3 10*3/uL (ref 0.1–1.0)
Monocytes Relative: 6 %
NEUTROS PCT: 50 %
Neutro Abs: 2.4 10*3/uL (ref 1.7–7.7)

## 2017-01-09 LAB — CBC
HCT: 40.5 % (ref 36.0–46.0)
HEMOGLOBIN: 13 g/dL (ref 12.0–15.0)
MCH: 29.7 pg (ref 26.0–34.0)
MCHC: 32.1 g/dL (ref 30.0–36.0)
MCV: 92.5 fL (ref 78.0–100.0)
PLATELETS: 299 10*3/uL (ref 150–400)
RBC: 4.38 MIL/uL (ref 3.87–5.11)
RDW: 13.7 % (ref 11.5–15.5)
WBC: 4.9 10*3/uL (ref 4.0–10.5)

## 2017-01-09 LAB — URINALYSIS, ROUTINE W REFLEX MICROSCOPIC
Bilirubin Urine: NEGATIVE
Glucose, UA: NEGATIVE mg/dL
KETONES UR: NEGATIVE mg/dL
Nitrite: NEGATIVE
PROTEIN: 100 mg/dL — AB
Specific Gravity, Urine: 1.012 (ref 1.005–1.030)
pH: 6 (ref 5.0–8.0)

## 2017-01-09 LAB — I-STAT TROPONIN, ED: Troponin i, poc: 0 ng/mL (ref 0.00–0.08)

## 2017-01-09 LAB — APTT: APTT: 33 s (ref 24–36)

## 2017-01-09 MED ORDER — SODIUM CHLORIDE 0.9 % IV SOLN
INTRAVENOUS | Status: DC
  Administered 2017-01-09: 75 mL/h via INTRAVENOUS
  Administered 2017-01-10 (×2): via INTRAVENOUS

## 2017-01-09 MED ORDER — ROSUVASTATIN CALCIUM 20 MG PO TABS: 20.0000 mg | ORAL_TABLET | Freq: Every day | ORAL | Status: DC

## 2017-01-09 MED ORDER — ACETAMINOPHEN 650 MG RE SUPP: 650.0000 mg | RECTAL | Status: DC | PRN

## 2017-01-09 MED ORDER — ACETAMINOPHEN 325 MG PO TABS: 650.0000 mg | ORAL_TABLET | ORAL | Status: DC | PRN

## 2017-01-09 MED ORDER — STROKE: EARLY STAGES OF RECOVERY BOOK
Freq: Once | Status: AC
  Administered 2017-01-09: 19:00:00

## 2017-01-09 MED ORDER — ACETAMINOPHEN 160 MG/5ML PO SOLN: 650.0000 mg | ORAL | Status: DC | PRN

## 2017-01-09 MED ORDER — AMLODIPINE BESYLATE 10 MG PO TABS
10.0000 mg | ORAL_TABLET | Freq: Every day | ORAL | Status: DC
  Filled 2017-01-09: qty 1

## 2017-01-09 MED ORDER — BENAZEPRIL HCL 20 MG PO TABS
40.0000 mg | ORAL_TABLET | Freq: Every day | ORAL | Status: DC
  Filled 2017-01-09: qty 2

## 2017-01-09 MED ORDER — AMLODIPINE BESY-BENAZEPRIL HCL 10-40 MG PO CAPS: 1.0000 | ORAL_CAPSULE | Freq: Every day | ORAL | Status: DC

## 2017-01-09 MED ORDER — DEXTROSE 5 % IV SOLN
1.0000 g | INTRAVENOUS | Status: DC
  Administered 2017-01-09 – 2017-01-10 (×2): 1 g via INTRAVENOUS
  Filled 2017-01-09 (×3): qty 10

## 2017-01-09 MED ORDER — FENOFIBRATE 160 MG PO TABS
160.0000 mg | ORAL_TABLET | Freq: Every day | ORAL | Status: DC
  Administered 2017-01-10 – 2017-01-11 (×2): 160 mg via ORAL
  Filled 2017-01-09 (×2): qty 1

## 2017-01-09 MED ORDER — CYCLOBENZAPRINE HCL 5 MG PO TABS
2.5000 mg | ORAL_TABLET | Freq: Once | ORAL | Status: AC
  Administered 2017-01-09: 2.5 mg via ORAL
  Filled 2017-01-09: qty 0.5

## 2017-01-09 MED ORDER — ATORVASTATIN CALCIUM 80 MG PO TABS
80.0000 mg | ORAL_TABLET | Freq: Every day | ORAL | Status: DC
  Administered 2017-01-09 – 2017-01-10 (×2): 80 mg via ORAL
  Filled 2017-01-09 (×2): qty 1

## 2017-01-09 MED ORDER — ASPIRIN EC 325 MG PO TBEC
325.0000 mg | DELAYED_RELEASE_TABLET | Freq: Every day | ORAL | Status: DC
Start: 2017-01-09 — End: 2017-01-10
  Administered 2017-01-09 – 2017-01-10 (×2): 325 mg via ORAL
  Filled 2017-01-09 (×2): qty 1

## 2017-01-09 MED ORDER — ENOXAPARIN SODIUM 40 MG/0.4ML ~~LOC~~ SOLN
40.0000 mg | SUBCUTANEOUS | Status: DC
  Administered 2017-01-09: 40 mg via SUBCUTANEOUS
  Filled 2017-01-09: qty 0.4

## 2017-01-09 MED ORDER — CALCIUM CARBONATE-VITAMIN D 500-200 MG-UNIT PO TABS
1.0000 | ORAL_TABLET | Freq: Every day | ORAL | Status: DC
  Administered 2017-01-10 – 2017-01-11 (×2): 1 via ORAL
  Filled 2017-01-09 (×2): qty 1

## 2017-01-09 MED ORDER — PANTOPRAZOLE SODIUM 40 MG PO TBEC
40.0000 mg | DELAYED_RELEASE_TABLET | Freq: Every day | ORAL | Status: DC
  Administered 2017-01-10 – 2017-01-11 (×2): 40 mg via ORAL
  Filled 2017-01-09 (×2): qty 1

## 2017-01-09 MED ORDER — IOPAMIDOL (ISOVUE-370) INJECTION 76%
INTRAVENOUS | Status: AC
  Administered 2017-01-09: 50 mL
  Filled 2017-01-09: qty 50

## 2017-01-09 NOTE — H&P (Signed)
History and Physical    Caitlin Mccarthy ZOX:[id number] DOB: 1936/06/09 DOA: 01/09/2017   PCP: [name], FNP   Attending physician: Dr. Aggie Moats  Patient coming from/Resides with: Private residence  Chief Complaint: Dysarthria and aphasia  HPI: Caitlin Mccarthy is a 81 y.o. female with medical history significant for hypertension, dyslipidemia, history of obstructing kidney stones with recurrent UTIs, recent lithotripsy with double-J stent placement left ureter on 6/26. Presented to the ER with slurred speech, transient aphasia and dysarthria. No other focal neurological symptoms. Daughter reported to EDP and neurology that symptoms consistent with prior UTI symptoms. Also noted to be having frequent PACs and question of atrial fibrillation while in the ER. Initial CT of the head was unremarkable. Urinalysis appears consistent with UTI.  In addition to the above patient had the double-J stent removed on 6/30 and was started on Augmentin for UTI prophylaxis. Since stent removal she has had several days of stuttering diarrhea without blood, she'll, and generalized malaise. Last night when symptoms began in regards to her altered mentation she apparently told her husband her jaw with tingling but had no other focal neurological deficits, no facial drooping, no gait disturbance or extremity numbness reported.  ED Course:  Vital Signs: BP 134/74   Pulse 69   Temp 98.2 F (36.8 C)   Resp 17   SpO2 100%  CT head: No acute intracranial abnormalities; trace left mastoid effusion Lab data: Sodium 137, potassium 4.3, chloride 105, CO2 24, glucose 149, BUN 8, creatinine 0.66, calcium 10.4, anion gap 8, LFTs normal except for trace elevation in AST to 46, poc troponin 0.00, white count 4900 with normal differential, hemoglobin 13, platelets 299,000, coags normal, urinalysis abnormal with hazy appearance, rare bacteria, small hemoglobin, small leukocytes, protein 100, squamous epithelial  6-30, WBC 6-30 Medications and treatments: None  Review of Systems:  In addition to the HPI above,  No changes with Vision or hearing, new weakness, tingling, numbness in any extremity, dizziness, gait disturbance or imbalance, tremors or seizure activity No problems swallowing food or Liquids, indigestion/reflux, choking or coughing while eating, abdominal pain with or after eating No Chest pain, Cough or Shortness of Breath, palpitations, orthopnea or DOE No Abdominal pain, N/V, melena,hematochezia, dark tarry stools, constipation No dysuria, malodorous urine, hematuria or flank pain No new skin rashes, lesions, masses or bruises, No new joint pains, aches, swelling or redness No recent unintentional weight gain or loss No polyuria, polydypsia or polyphagia   Past Medical History:  Diagnosis Date  . Anxiety   . Depression   . GERD (gastroesophageal reflux disease)   . History of acute pyelonephritis    last episode 04-13-2015  w/ sepsis  . History of kidney stones   . History of recurrent UTIs    MULTIPLE  . Hyperlipidemia   . Hypertension   . OA (osteoarthritis)    knees, hips, hands  . Osteoporosis   . Poor memory    especially when has uti  . Renal calculus, left   . Type 2 diabetes mellitus ([place])   . Vitamin D deficiency 05/10/2016    Past Surgical History:  Procedure Laterality Date  . CATARACT EXTRACTION W/PHACO  06/06/2011   Procedure: CATARACT EXTRACTION PHACO AND INTRAOCULAR LENS PLACEMENT (IOC);  Surgeon: Tonny Branch;  Location: [facility];  Service: Ophthalmology;  Laterality: Right;  CDE=12.77  . CATARACT EXTRACTION W/PHACO  06/27/2011   Procedure: CATARACT EXTRACTION PHACO AND INTRAOCULAR LENS PLACEMENT (IOC);  Surgeon: Tonny Branch;  Location: [facility];  Service: Ophthalmology;  Laterality: Left;  CDE:13.96  . CYSTOSCOPY WITH URETEROSCOPY AND STENT PLACEMENT Right 04/27/2015   Procedure: CYSTOSCOPY WITH RIGHT URETEROSCOPY, BASKET REMOVAL OF STONE, REMOVAL OF RIGHT  NEPHROSTOMY TUBE;  Surgeon: Irine Seal, MD;  Location: [place] [facility];  Service: Urology;  Laterality: Right;  . CYSTOSCOPY/URETEROSCOPY/HOLMIUM LASER/STENT PLACEMENT Left 12/24/2016   Procedure: LEFT URETEROSCOPY WITH HOLMIUM LASER AND STENT PLACEMENT;  Surgeon: Irine Seal, MD;  Location: [facility];  Service: Urology;  Laterality: Left;  . EXTRACORPOREAL SHOCK WAVE LITHOTRIPSY  left 04-01-2016;  1980's  . HOLMIUM LASER APPLICATION Right 77/41/2878   Procedure: HOLMIUM LASER APPLICATION;  Surgeon: Irine Seal, MD;  Location: [facility];  Service: Urology;  Laterality: Right;  . KNEE ARTHROSCOPY Bilateral right 2005//  left ?  [name] TOTAL HIP ARTHROPLASTY  10/28/2011   Procedure: TOTAL HIP ARTHROPLASTY;  Surgeon: Gearlean Alf, MD;  Location: [facility];  Service: Orthopedics;  Laterality: Right;  . TOTAL KNEE ARTHROPLASTY Bilateral left 03-09-2007//  right 2006  . TRANSTHORACIC ECHOCARDIOGRAM  10/06/2006   normal echo,  ef 55-60%  . TUBAL LIGATION      Social History   Social History  . Marital status: Married    Spouse name: N/A  . Number of children: N/A  . Years of education: N/A   Occupational History  . Not on file.   Social History Main Topics  . Smoking status: Current Every Day Smoker    Packs/day: 0.50    Years: 42.00    Types: Cigarettes  . Smokeless tobacco: Never Used  . Alcohol use No  . Drug use: No  . Sexual activity: Not on file   Other Topics Concern  . Not on file   Social History Narrative  . No narrative on file    Mobility: Rolling walker and cane Work history: Not obtained   No Known Allergies  Family History  Problem Relation Age of Onset  . Kidney disease Mother   . Diabetes Mother   . Congestive Heart Failure Father   . Heart disease Brother   . Alcohol abuse Brother   . Anesthesia problems Neg Hx   . Hypotension Neg Hx   . Malignant hyperthermia Neg Hx   . Pseudochol deficiency Neg Hx      Prior to Admission medications   Medication Sig Start Date End Date Taking? Authorizing Provider  amLODipine-benazepril (LOTREL) 10-40 MG capsule TAKE ONE CAPSULE EACH MORNING 09/26/16  Yes Hassell Done, Mary-Margaret, FNP  amoxicillin-clavulanate (AUGMENTIN) 500-125 MG tablet Take 1 tablet (500 mg total) by mouth at bedtime. 12/24/16  Yes Irine Seal, MD  calcium-vitamin D (OSCAL WITH D) 500-200 MG-UNIT tablet Take 1 tablet by mouth daily. 05/09/16  Yes Cherre Robins, PharmD  fenofibrate (TRICOR) 145 MG tablet Take 1 tablet (145 mg total) by mouth daily. 01/01/16  Yes Hawks, Christy A, FNP  HYDROcodone-acetaminophen (NORCO) 5-325 MG tablet Take 1 tablet by mouth every 6 (six) hours as needed for moderate pain. 12/24/16  Yes Irine Seal, MD  lansoprazole (PREVACID) 30 MG capsule Take 30 mg by mouth daily as needed (indigestion).  08/19/16  Yes [provider]  omeprazole (PRILOSEC) 20 MG capsule Take 20 mg by mouth daily as needed (heartburn).  08/27/16  Yes [provider]  PREMARIN vaginal cream Apply 1 application topically daily as needed (irritation).  12/06/16  Yes [provider]  rosuvastatin (CRESTOR) 20 MG tablet TAKE ONE TABLET DAILY AT BEDTIME 10/22/16  Yes  Hawks, Christy A, FNP  traMADol (ULTRAM) 50 MG tablet Take 1-2 tablets (50-100 mg total) by mouth every 12 (twelve) hours as needed. Patient taking differently: Take 50-100 mg by mouth every 12 (twelve) hours as needed for moderate pain.  10/10/16  Yes Hawks, Christy A, FNP  triamcinolone cream (KENALOG) 0.1 % Apply 1 application topically daily as needed (itching).  10/01/16  Yes [provider]  alendronate (FOSAMAX) 70 MG tablet Take 1 tablet (70 mg total) by mouth every 7 (seven) days. Take with a full glass of water on an empty stomach. Patient not taking: Reported on 01/09/2017 05/09/16   [name], FNP  ALPRAZolam Duanne Moron) 0.25 MG tablet Take 1 tablet (0.25 mg total) by mouth 2 (two) times daily as  needed for anxiety. Patient not taking: Reported on 01/09/2017 07/12/16   Evelina Dun A, FNP  cyclobenzaprine (FLEXERIL) 5 MG tablet TAKE ONE TABLET BY MOUTH THREE TIMES DAILY AS NEEDED FOR MUSCLE SPASM Patient not taking: Reported on 01/09/2017 05/10/15   [name], FNP  hydrOXYzine (ATARAX/VISTARIL) 25 MG tablet Take 1 tablet (25 mg total) by mouth 3 (three) times daily as needed. Patient not taking: Reported on 01/09/2017 11/07/16   Eustaquio Maize, MD    Physical Exam: Vitals:   01/09/17 1245 01/09/17 1300 01/09/17 1330 01/09/17 1345  BP: (!) 147/74 (!) 150/73 (!) 153/76 134/74  Pulse: 71 68 69   Resp: 19 18 16 17   Temp:      TempSrc:      SpO2: 95% 100% 100%       Constitutional: NAD, calm, comfortable Eyes: PERRL, lids and conjunctivae normal ENMT: Mucous membranes are moist. Posterior pharynx clear of any exudate or lesions.Normal dentition.  Neck: normal, supple, no masses, no thyromegaly Respiratory: clear to auscultation bilaterally, no wheezing, no crackles. Normal respiratory effort. No accessory muscle use.  Cardiovascular: Occasionally irregular rate and rhythm, no murmurs / rubs / gallops. No extremity edema. 2+ pedal pulses. No carotid bruits. Patient just returned from radiology and telemetry monitor not yet reapplied  Abdomen: no tenderness, no masses palpated. No hepatosplenomegaly. Bowel sounds positive.  Musculoskeletal: no clubbing / cyanosis. No joint deformity upper and lower extremities. Good ROM, no contractures. Normal muscle tone.  Skin: no rashes, lesions, ulcers. No induration Neurologic: CN 2-12 grossly intact. Sensation intact, DTR normal. Strength 5/5 x all 4 extremities.  Psychiatric: Normal judgment and insight. Alert and oriented x 3. Normal mood.    Labs on Admission: I have personally reviewed following labs and imaging studies  CBC:  Recent Labs Lab 01/09/17 0928 01/09/17 0959  WBC 4.9  --   NEUTROABS 2.4  --   HGB 13.0 14.3   HCT 40.5 42.0  MCV 92.5  --   PLT 299  --    Basic Metabolic Panel:  Recent Labs Lab 01/09/17 0928 01/09/17 0959  NA 137 139  K 4.3 4.2  CL 105 104  CO2 24  --   GLUCOSE 149* 151*  BUN 8 11  CREATININE 0.66 0.60  CALCIUM 10.4*  --    GFR: CrCl cannot be calculated (Unknown ideal weight.). Liver Function Tests:  Recent Labs Lab 01/09/17 0928  AST 46*  ALT 33  ALKPHOS 65  BILITOT 0.6  PROT 7.8  ALBUMIN 3.9   No results for input(s): LIPASE, AMYLASE in the last 168 hours. No results for input(s): AMMONIA in the last 168 hours. Coagulation Profile:  Recent Labs Lab 01/09/17 0928  INR 0.99  Cardiac Enzymes: No results for input(s): CKTOTAL, CKMB, CKMBINDEX, TROPONINI in the last 168 hours. BNP (last 3 results) No results for input(s): PROBNP in the last 8760 hours. HbA1C: No results for input(s): HGBA1C in the last 72 hours. CBG: No results for input(s): GLUCAP in the last 168 hours. Lipid Profile: No results for input(s): CHOL, HDL, LDLCALC, TRIG, CHOLHDL, LDLDIRECT in the last 72 hours. Thyroid Function Tests: No results for input(s): TSH, T4TOTAL, FREET4, T3FREE, THYROIDAB in the last 72 hours. Anemia Panel: No results for input(s): VITAMINB12, FOLATE, FERRITIN, TIBC, IRON, RETICCTPCT in the last 72 hours. Urine analysis:    Component Value Date/Time   COLORURINE YELLOW 01/09/2017 1217   APPEARANCEUR HAZY (A) 01/09/2017 1217   APPEARANCEUR Clear 11/07/2016 0955   LABSPEC 1.012 01/09/2017 1217   PHURINE 6.0 01/09/2017 1217   GLUCOSEU NEGATIVE 01/09/2017 1217   HGBUR SMALL (A) 01/09/2017 1217   BILIRUBINUR NEGATIVE 01/09/2017 1217   BILIRUBINUR Negative 11/07/2016 0955   KETONESUR NEGATIVE 01/09/2017 1217   PROTEINUR 100 (A) 01/09/2017 1217   UROBILINOGEN negative 08/17/2015 1409   UROBILINOGEN 0.2 04/13/2015 1451   NITRITE NEGATIVE 01/09/2017 1217   LEUKOCYTESUR SMALL (A) 01/09/2017 1217   LEUKOCYTESUR 2+ (A) 11/07/2016 0955   Sepsis  Labs: @LABRCNTIP (procalcitonin:4,lacticidven:4) )No results found for this or any previous visit (from the past 240 hour(s)).   Radiological Exams on Admission: Ct Head Wo Contrast  Result Date: 01/09/2017 CLINICAL DATA:  81 year old female with difficulty speaking since yesterday evening. No headache or other complaints. EXAM: CT HEAD WITHOUT CONTRAST TECHNIQUE: Contiguous axial images were obtained from the base of the skull through the vertex without intravenous contrast. COMPARISON:  Head CT 04/13/2015. FINDINGS: Brain: Patchy and confluent areas of decreased attenuation are noted throughout the deep and periventricular white matter of the cerebral hemispheres bilaterally, compatible with chronic microvascular ischemic disease. No evidence of acute infarction, hemorrhage, hydrocephalus, extra-axial collection or mass lesion/mass effect. Vascular: No hyperdense vessel or unexpected calcification. Skull: Normal. Negative for fracture or focal lesion. Sinuses/Orbits: Small amount of mucosal thickening in the posterior right ethmoids. Small amount of dependent frothy secretions in the sphenoid sinus lying dependently. Trace left mastoid effusion. Other: None. IMPRESSION: 1. No acute intracranial abnormalities to account for the patient's symptoms. 2. Chronic microvascular ischemic changes in the cerebral white matter, as above. 3. Mild paranasal sinus disease and trace left mastoid effusion incidentally noted. Electronically Signed   By: Vinnie Langton M.D.   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 -Frequent neuro checks -Stat CTA head and neck -Begin full dose aspirin -Echocardiogram -Hemoglobin A1c and lipid panel-change to high dose statin (Lipitor 80 mg) -PT/OT/SLP evaluation -risk factor modification re smoking cessation  Active Problems:   Acute metabolic encephalopathy -Patient presents with above symptoms that also could be related to dehydration and/or UTI -MRI + for CVA    ? Atrial Fibrillation -EKG documents PACs -RN reports brief runs of atrial fibrillation but not captured on telemetry strip or 12-lead -Current telemetry with sinus rhythm, biphasic P waves and frequent PACs; discussed  at length with neurologist Rory Percy who is highly suspicious of underlying atrial fibrillation-no full dose anticoagulation in setting of acute stroke but recommends beginning aspirin and statin -Continue telemetry -Obtain echocardiogram -CHADVASc = 4 so if determined to have atrial fibrillation will need to begin anticoagulation once appropriate after acute CVA    Hypertension -Current blood pressure controlled -Continue preadmission amlodipine benazepril since renal function is normal    History of kidney stones -Patient has recently undergone lithotripsy with double-J stent placement to left ureter on 6/26 with removal of double-J stent on 6/30 -Urologist is Dr. Jeffie Pollock -Creatinine is normal therefore do not suspect stent obstruction and/or hydronephrosis    Abnormal urinalysis -Presents with abnormal urinalysis ?? consistent with UTI and/or volume depletion status -Was on UTI prophylaxis with Augmentin prior to arrival-hold given prior UTI resistance pattern -History of multidrug resistant Klebsiella in the past (resistant to ampicillin, Cipro, Levaquin and Macrodantin) -Also history of multidrug resistant Citrobacter (resistant to cefazolin, cephalothin,  tetracycline, and trimethoprim sulfa) in April 2018 -Obtain urine culture -Empiric Rocephin    OA (osteoarthritis)/osteoporosis/vitamin D deficiency -Continue home medications    Hyperlipidemia -Continue fenofibrate  -Crestor changed to high-dose Lipitor in setting of acute stroke     DVT prophylaxis: Lovenox Code Status: Full Family Communication: Daughters  Disposition Plan: Home Consults called: Neurology/Arora    Samella Parr ANP-BC [facility] Pager [phone]   If 7PM-7AM, please contact night-coverage [email or web address] Password [email or web address]  01/09/2017, 2:22 PM

## 2017-01-09 NOTE — ED Notes (Signed)
Unsuccessful attempt at giving report to 76M.

## 2017-01-09 NOTE — Consult Note (Signed)
Requesting Physician: Dr. Laverta Baltimore    Chief Complaint: Aphasia and slurred speech starting 7/11  History obtained from:  Patient, chart and family  HPI:                                                                                                                                      Caitlin Mccarthy is an 81 y.o. female who began having trouble speaking beginning the morning of 7/11. She was unable "to get [her] words out", although she knew what she wanted to say and recalls being about to understand those around her. [name] stated that these symptoms resolved sometime this morning. She denies any numbness or weakness in her extremities, dizziness, blurry or double vision.  Daughter at bedside stated that she was repeating 'uh' yesterday and not babbling. Further, she appeared confused in a manner that is typical of when she has a UTI. To her daughter, [name] apshe is almost back to normal, just slightly more confused than baseline and occasionally slurring her words.   Notably, she does not have a diagnosis of atrial fibrillation, nor is she on any anticoagulation, but was switching between NSR with PACs and afib on my visit. She denies chest pain and palpitations  She denies any stroke-like symptoms in the past, but does have multiple risk factors for stroke (below).  Pertinent Imaging: CT head 7/12: No acute abnormalities. Chronic small vessel disease MR brain pending  Date last known well: Date: 01/08/2017 Time last known well: Unable to determine  tPA Given: No: Out of window, not LVO  Modified Rankin Score: 1  Stroke Risk Factors - atrial fibrillation, diabetes mellitus, hypercoagulable state, hyperlipidemia, hypertension and smoking   Past Medical History:  Diagnosis Date  . Anxiety   . Depression   . GERD (gastroesophageal reflux disease)   . History of acute pyelonephritis    last episode 04-13-2015  w/ sepsis  . History of kidney stones   . History of recurrent  UTIs    MULTIPLE  . Hyperlipidemia   . Hypertension   . OA (osteoarthritis)    knees, hips, hands  . Osteoporosis   . Poor memory    especially when has uti  . Renal calculus, left   . Type 2 diabetes mellitus ([place])   . Vitamin D deficiency 05/10/2016    Past Surgical History:  Procedure Laterality Date  . CATARACT EXTRACTION W/PHACO  06/06/2011   Procedure: CATARACT EXTRACTION PHACO AND INTRAOCULAR LENS PLACEMENT (IOC);  Surgeon: Tonny Branch;  Location: [facility];  Service: Ophthalmology;  Laterality: Right;  CDE=12.77  . CATARACT EXTRACTION W/PHACO  06/27/2011   Procedure: CATARACT EXTRACTION PHACO AND INTRAOCULAR LENS PLACEMENT (IOC);  Surgeon: Tonny Branch;  Location: [facility];  Service: Ophthalmology;  Laterality: Left;  CDE:13.96  . CYSTOSCOPY WITH URETEROSCOPY AND STENT PLACEMENT Right 04/27/2015   Procedure: CYSTOSCOPY WITH RIGHT URETEROSCOPY, BASKET REMOVAL OF STONE, REMOVAL  OF RIGHT NEPHROSTOMY TUBE;  Surgeon: Irine Seal, MD;  Location: [facility];  Service: Urology;  Laterality: Right;  . CYSTOSCOPY/URETEROSCOPY/HOLMIUM LASER/STENT PLACEMENT Left 12/24/2016   Procedure: LEFT URETEROSCOPY WITH HOLMIUM LASER AND STENT PLACEMENT;  Surgeon: Irine Seal, MD;  Location: [facility];  Service: Urology;  Laterality: Left;  . EXTRACORPOREAL SHOCK WAVE LITHOTRIPSY  left 04-01-2016;  1980's  . HOLMIUM LASER APPLICATION Right 67/06/4579   Procedure: HOLMIUM LASER APPLICATION;  Surgeon: Irine Seal, MD;  Location: [facility];  Service: Urology;  Laterality: Right;  . KNEE ARTHROSCOPY Bilateral right 2005//  left ?  [name] TOTAL HIP ARTHROPLASTY  10/28/2011   Procedure: TOTAL HIP ARTHROPLASTY;  Surgeon: Gearlean Alf, MD;  Location: [facility];  Service: Orthopedics;  Laterality: Right;  . TOTAL KNEE ARTHROPLASTY Bilateral left 03-09-2007//  right 2006  . TRANSTHORACIC ECHOCARDIOGRAM  10/06/2006   normal echo,  ef 55-60%  . TUBAL LIGATION      Family  History  Problem Relation Age of Onset  . Kidney disease Mother   . Diabetes Mother   . Congestive Heart Failure Father   . Heart disease Brother   . Alcohol abuse Brother   . Anesthesia problems Neg Hx   . Hypotension Neg Hx   . Malignant hyperthermia Neg Hx   . Pseudochol deficiency Neg Hx      reports that she has been smoking Cigarettes.  She has a 21.00 pack-year smoking history. She has never used smokeless tobacco. She reports that she does not drink alcohol or use drugs.  No Known Allergies  Medications:                                                                                                                       No outpatient prescriptions have been marked as taking for the 01/09/17 encounter [facility] Encounter).    Review Of Systems:                                                                                                           History obtained from the patient  General: Negative for fatigue, fever, night sweats, weight gain or weight loss Psychological: Positive for occasional anxiety Ophthalmic: Negative for blurry or double vision, loss of vision in any field or eye pain ENT: Negative for abrupt loss of hearing, tinnitus or vertigo Respiratory: Negative for cough, SOB or wheezing Cardiovascular: As above. Negative for chest pain, edema Gastrointestinal: Negative for abdominal pain, nausea/vomiting or stool incontinence Genito-Urinary: Negative for dysuria Musculoskeletal: Negative  for joint swelling or muscular weakness Neurological: As noted in HPI Dermatological: Negative for rash or skin changes, numbness or tingling  Blood pressure (!) 161/60, pulse 71, temperature 98.2 F (36.8 C), resp. rate 18, SpO2 95 %.   Physical Examination:                                                                                                      General: WDWN female.  HEENT:  Normocephalic, no lesions, without obvious abnormality.  Normal external eye and  conjunctiva.  Normal external ears. Normal external nose, mucus membranes and septum.  Normal pharynx. Cardiovascular: irregularly irregular rhythm, S1, S2 normal and NSR and PACs, pulses palpable throughout   Pulmonary: chest clear, no wheezing, rales, normal symmetric air entry Abdomen: soft, non-tender Extremities: no joint deformities, effusion, or inflammation. Bilateral knee replacements, left hip replacement Musculoskeletal: no joint tenderness, deformity or swelling. Tone and bulk normal throughout; no atrophy noted Skin: warm and dry, no hyperpigmentation, vitiligo, or suspicious lesions  Neurological Examination:                                                                                               Mental Status: KAPRICE KAGE is alert, oriented, thought content appropriate.  Speech fluent, occasionally dysarthric without evidence of aphasia. Able to follow 3-step commands without difficulty. Cranial Nerves: II: Visual fields grossly normal, pupils are equal, round, reactive to light and accommodation. III,IV, VI: Ptosis not present, extra-ocular muscle movements intact bilaterally V,VII: Smile and eyebrow raise is symmetric. Facial light touch and pinprick sensation intact bilaterally VIII: Hearing grossly intact to loud conversational voice IX,X: Uvula and palate rise symmetrically XI: SCM and bilateral shoulder shrug strength 5/5 XII: Midline tongue extension Motor: Right :     Upper extremity   4+/5   Left:     Upper extremity   5/5 Unable to accurately assess lower extremity strength secondary to hip pain Arm and leg drift present, left>right Sensory: Pinprick and temperature sensed less on left than right extremities. Light touch intact throughout, bilaterally Deep Tendon Reflexes: 2+ and symmetric throughout BUE. AJ 2+ and symmetric. Dropped patellar reflexes Plantars: Right: downgoing   Left: downgoing Cerebellar: Finger-to-nose test with evidence ataxia, more on  the left. Gait: Deferred  Lab Results: Basic Metabolic Panel:  Recent Labs Lab 01/09/17 0928 01/09/17 0959  NA 137 139  K 4.3 4.2  CL 105 104  CO2 24  --   GLUCOSE 149* 151*  BUN 8 11  CREATININE 0.66 0.60  CALCIUM 10.4*  --     Liver Function Tests:  Recent Labs Lab 01/09/17 0928  AST 46*  ALT 33  ALKPHOS 65  BILITOT  0.6  PROT 7.8  ALBUMIN 3.9   No results for input(s): LIPASE, AMYLASE in the last 168 hours. No results for input(s): AMMONIA in the last 168 hours.  CBC:  Recent Labs Lab 01/09/17 0928 01/09/17 0959  WBC 4.9  --   NEUTROABS 2.4  --   HGB 13.0 14.3  HCT 40.5 42.0  MCV 92.5  --   PLT 299  --     Cardiac Enzymes: No results for input(s): CKTOTAL, CKMB, CKMBINDEX, TROPONINI in the last 168 hours.  Lipid Panel: No results for input(s): CHOL, TRIG, HDL, CHOLHDL, VLDL, LDLCALC in the last 168 hours.  CBG: No results for input(s): GLUCAP in the last 168 hours.  Microbiology: Results for orders placed or performed during the hospital encounter of 12/24/16  Stone analysis     Status: None   Collection Time: 12/24/16 10:19 AM  Result Value Ref Range Status   Color Tan  Final   Size Comment mm Final    Comment: Specimen received as fragments.   Stone Weight KSTONE 94.0 mg Corrected   Nidus No Nidus visualized  Corrected   Ca Oxalate,Monohydr. 55 % Corrected   Ca phos cry stone ql IR 30 % Corrected   Magnesium Ammon Phos 15 % Corrected   Composition Comment  Corrected    Comment: Percentage (Represents the % composition)   Photo Comment  Corrected    Comment: Photograph will follow under separate cover.   Comment: Comment  Corrected    Comment: (NOTE) Physician questions regarding Calculi Analysis contact [facility] at: [phone].    PLEASE NOTE: Comment  Corrected    Comment: (NOTE) Calculi report with photograph will follow via computer, mail or courier delivery.    Disclaimer - Kidney Stone Analysis: Comment  Corrected     Comment: (NOTE) This test was developed and its performance characteristics determined by [facility]. It has not been cleared or approved by the Food and Drug Administration. Performed At: [facility] [place], [place] [id number] Lindon Romp MD OX:[id number]     Coagulation Studies:  Recent Labs  01/09/17 0928  LABPROT 13.0  INR 0.99    Imaging: Ct Head Wo Contrast  Result Date: 01/09/2017 CLINICAL DATA:  81 year old female with difficulty speaking since yesterday evening. No headache or other complaints. EXAM: CT HEAD WITHOUT CONTRAST TECHNIQUE: Contiguous axial images were obtained from the base of the skull through the vertex without intravenous contrast. COMPARISON:  Head CT 04/13/2015. FINDINGS: Brain: Patchy and confluent areas of decreased attenuation are noted throughout the deep and periventricular white matter of the cerebral hemispheres bilaterally, compatible with chronic microvascular ischemic disease. No evidence of acute infarction, hemorrhage, hydrocephalus, extra-axial collection or mass lesion/mass effect. Vascular: No hyperdense vessel or unexpected calcification. Skull: Normal. Negative for fracture or focal lesion. Sinuses/Orbits: Small amount of mucosal thickening in the posterior right ethmoids. Small amount of dependent frothy secretions in the sphenoid sinus lying dependently. Trace left mastoid effusion. Other: None. IMPRESSION: 1. No acute intracranial abnormalities to account for the patient's symptoms. 2. Chronic microvascular ischemic changes in the cerebral white matter, as above. 3. Mild paranasal sinus disease and trace left mastoid effusion incidentally noted. Electronically Signed   By: Vinnie Langton M.D.   On: 01/09/2017 10:35    Assessment and plan per attending neurologist.  Lupita Raider PA-C [facility] [phone]  01/09/2017, 12:33 PM Attending addendum Patient seen and examined in the emergency  room CC: concern of stroke. 81 year old woman with past  history of smoking, hypertension, diabetes, hyperlipidemia and remote skin cancer status post surgery and admission, started having trouble with words on 01/08/2017. She said she was unable to get words out although she knew exactly what she wanted to say. This symptomatology waxed and waned and because of recurrence of these symptoms multiple times in the past day, she decided to come in for a checkup.  Review of systems as above  On exam CVS: S1S2+, possibly irregular rhythm Neuro Mental status examination is unremarkable Cranial nerve examination does not reveal any abnormality Her speech is clear but she does have subtle word finding difficulty. Motor exam reveals subtle right upper extremity weakness/5, rest of the motor exam within normal limits. Subtle decrease in sensation on the left, more so over the area of her skin cancer resection. 2+ deep tendon reflexes all over except bilateral patella. Downgoing plantars bilaterally No gross dysmetria observed  Noncontrast CT scan was unremarkable MRI of the brain obtained in the emergency room shows scattered embolic looking left MCA territory infarcts. Telemetry had Afib alarms, but it seems more like sinus with PACs. Suspect true underlying Afib due to distribution of strokes.  Assessment and plan 81 year old with multiple stroke risk factors presenting to the emergency room for evaluation of word finding difficulty that started yesterday.  She is outside of the TPA window and his symptoms to suggest large vessel occlusion for intra-arterial intervention.  Etiology is most likely cardioembolic v. atheorembolic (smoker, HLD etc)  Impression Cardioembolic Acute Ischemic Stroke HTN HLD DM Tobacco abuse   Recommendations 1. HgbA1c, fasting lipid panel 2. MRI, MRA  of the brain without contrast 3. Frequent neuro checks 4. Echocardiogram 5. Carotid dopplers 6.  Prophylactic therapy-Antiplatelet med: Aspirin - dose 325mg  PO or 300mg  PR as well as Atorvastatin 80mg  PO daily. 7. Risk factor modification 8. Telemetry monitoring 9. PT consult, OT consult, Speech consult 10. If no Afib is detected during this admission, she might need long term cardiac monitoring and eventual lifelong anticoagulation if Afib is found. Decision will be made by stroke team based on clinical course and test results.  Please page stroke NP  Or  PA  Or MD  from 8am -4 pm as this patient will be followed by the stroke team at this point.   You can look them up on [email or web address]    Discussed with the patient in detail the importance of MRSA modification including smoking cessation

## 2017-01-09 NOTE — ED Notes (Signed)
Pt still at MRI

## 2017-01-09 NOTE — ED Provider Notes (Signed)
Emergency Department Provider Note   I have reviewed the triage vital signs and the nursing notes.   HISTORY  Chief Complaint Aphasia   HPI Caitlin Mccarthy is a 81 y.o. female with PMH of GERD, HLD, HTN, and DM presents to the emergency department for evaluation of speech change starting yesterday. The patient reports having significant difficulty saying what she was trying to say. She describes it as a "stuttering" which she has never done in the past. Patient states she had no difficulty thinking of the words she wanted to say but when he came to actually saying the word she was unable to do so. Symptoms were severe yesterday morning and so she decided to not talk for most of the day. It's unclear if they fully resolved but this morning she again had severe difficulty getting out words. No new numbness or weakness. No gait instability. No difficulty swallowing. When family came over and saw her in this state they presented to the emergency department. Patient with no prior history of stroke. She does note some right hand weakness that has been present for the last year. She notes difficulty holding objects in that hand and frequent only dropping them. No head injury. The patient did have ureteral stent placed in the last 2 weeks for kidney stone. The only new medication she is on is amoxicillin as a preventative for UTI in the post-op setting. No fever or chills.   Past Medical History:  Diagnosis Date  . Anxiety   . Depression   . GERD (gastroesophageal reflux disease)   . History of acute pyelonephritis    last episode 04-13-2015  w/ sepsis  . History of kidney stones   . History of recurrent UTIs    MULTIPLE  . Hyperlipidemia   . Hypertension   . OA (osteoarthritis)    knees, hips, hands  . Osteoporosis   . Poor memory    especially when has uti  . Renal calculus, left   . Type 2 diabetes mellitus ([place])   . Vitamin D deficiency 05/10/2016    Patient Active Problem  List   Diagnosis Date Noted  . Hypertension 01/09/2017  . OA (osteoarthritis) 01/09/2017  . History of kidney stones 01/09/2017  . Hyperlipidemia 01/09/2017  . Abnormal urinalysis 01/09/2017  . Acute metabolic encephalopathy 18/29/9371  . Dysarthria 01/09/2017  . Anxiety 01/09/2017  . Glaucoma 05/10/2016  . Vitamin D deficiency 05/10/2016  . Current smoker 05/10/2016  . Osteoarthritis of both hands 05/10/2016  . History of total right hip replacement 05/10/2016  . History of total knee replacement, bilateral 05/10/2016  . Abnormal immunoelectrophoresis 05/10/2016  . Osteoporosis 05/09/2016  . Type 2 diabetes mellitus ([place]) 02/05/2016  . Overweight (BMI 25.0-29.9) 12/28/2015  . GAD (generalized anxiety disorder) 12/28/2015  . Hypokalemia 06/02/2015  . Acute encephalopathy 04/13/2015  . Chronic pain 04/13/2015  . Metabolic syndrome 69/67/8938  . Arthritis 11/29/2013  . Essential hypertension, benign 10/02/2012  . Hyperlipidemia with target LDL less than 100 10/02/2012  . GERD (gastroesophageal reflux disease) 10/02/2012  . Depression 10/02/2012  . OA (osteoarthritis) of hip 10/28/2011    Past Surgical History:  Procedure Laterality Date  . CATARACT EXTRACTION W/PHACO  06/06/2011   Procedure: CATARACT EXTRACTION PHACO AND INTRAOCULAR LENS PLACEMENT (IOC);  Surgeon: Tonny Branch;  Location: [facility];  Service: Ophthalmology;  Laterality: Right;  CDE=12.77  . CATARACT EXTRACTION W/PHACO  06/27/2011   Procedure: CATARACT EXTRACTION PHACO AND INTRAOCULAR LENS PLACEMENT (IOC);  Surgeon: Levada Dy  Hunt;  Location: [facility];  Service: Ophthalmology;  Laterality: Left;  CDE:13.96  . CYSTOSCOPY WITH URETEROSCOPY AND STENT PLACEMENT Right 04/27/2015   Procedure: CYSTOSCOPY WITH RIGHT URETEROSCOPY, BASKET REMOVAL OF STONE, REMOVAL OF RIGHT NEPHROSTOMY TUBE;  Surgeon: Irine Seal, MD;  Location: [facility];  Service: Urology;  Laterality: Right;  . CYSTOSCOPY/URETEROSCOPY/HOLMIUM  LASER/STENT PLACEMENT Left 12/24/2016   Procedure: LEFT URETEROSCOPY WITH HOLMIUM LASER AND STENT PLACEMENT;  Surgeon: Irine Seal, MD;  Location: [facility];  Service: Urology;  Laterality: Left;  . EXTRACORPOREAL SHOCK WAVE LITHOTRIPSY  left 04-01-2016;  1980's  . HOLMIUM LASER APPLICATION Right 75/64/3329   Procedure: HOLMIUM LASER APPLICATION;  Surgeon: Irine Seal, MD;  Location: [facility];  Service: Urology;  Laterality: Right;  . KNEE ARTHROSCOPY Bilateral right 2005//  left ?  [name] TOTAL HIP ARTHROPLASTY  10/28/2011   Procedure: TOTAL HIP ARTHROPLASTY;  Surgeon: Gearlean Alf, MD;  Location: [facility];  Service: Orthopedics;  Laterality: Right;  . TOTAL KNEE ARTHROPLASTY Bilateral left 03-09-2007//  right 2006  . TRANSTHORACIC ECHOCARDIOGRAM  10/06/2006   normal echo,  ef 55-60%  . TUBAL LIGATION      Current Outpatient Rx  . Order #: [id number] Class: Normal  . Order #: [id number] Class: Normal  . Order #: [id number] Class: OTC  . Order #: [id number] Class: Normal  . Order #: [id number] Class: Print  . Order #: [id number] Class: Historical Med  . Order #: [id number] Class: Historical Med  . Order #: [id number] Class: Historical Med  . Order #: [id number] Class: Normal  . Order #: [id number] Class: Print  . Order #: [id number] Class: Historical Med  . Order #: [id number] Class: Normal  . Order #: [id number] Class: Print  . Order #: [id number] Class: Normal  . Order #: [id number] Class: Normal    Allergies Patient has no known allergies.  Family History  Problem Relation Age of Onset  . Kidney disease Mother   . Diabetes Mother   . Congestive Heart Failure Father   . Heart disease Brother   . Alcohol abuse Brother   . Anesthesia problems Neg Hx   . Hypotension Neg Hx   . Malignant hyperthermia Neg Hx   . Pseudochol deficiency Neg Hx     Social History Social History  Substance Use Topics  . Smoking status: Current Every Day Smoker    Packs/day: 0.50    Years:  42.00    Types: Cigarettes  . Smokeless tobacco: Never Used  . Alcohol use No    Review of Systems  Constitutional: No fever/chills Eyes: No visual changes. ENT: No sore throat. Cardiovascular: Denies chest pain. Respiratory: Denies shortness of breath. Gastrointestinal: No abdominal pain.  No nausea, no vomiting.  No diarrhea.  No constipation. Genitourinary: Negative for dysuria. Musculoskeletal: Negative for back pain. Skin: Negative for rash. Neurological: Negative for headaches, focal weakness or numbness. Positive speech difficulty.   10-point ROS otherwise negative.  ____________________________________________   PHYSICAL EXAM:  VITAL SIGNS: ED Triage Vitals [01/09/17 0926]  Enc Vitals Group     BP (!) 161/60     Pulse Rate 71     Resp 18     Temp 98 F (36.7 C)     Temp Source Oral     SpO2 95 %   Constitutional: Alert and oriented. Well appearing and in no acute distress. Eyes: Conjunctivae are normal. PERRL. EOMI.  Head: Atraumatic. Nose: No congestion/rhinnorhea. Mouth/Throat: Mucous membranes are moist.   Neck: No stridor.  Cardiovascular: Normal rate, regular rhythm. Good peripheral circulation.  Grossly normal heart sounds.   Respiratory: Normal respiratory effort.  No retractions. Lungs CTAB. Gastrointestinal: Soft and nontender. No distention.  Musculoskeletal: No lower extremity tenderness nor edema. No gross deformities of extremities. Neurologic:  Normal speech and language. One brief episode of stuttering but otherwise normal, fluid speech. CN exam 2-12 normal. No pronator drift. Slight decreased grip strength in the right hand otherwise not motor or sensory deficits.  Skin:  Skin is warm, dry and intact. No rash noted.   ____________________________________________   LABS (all labs ordered are listed, but only abnormal results are displayed)  Labs Reviewed  COMPREHENSIVE METABOLIC PANEL - Abnormal; Notable for the following:       Result  Value   Glucose, Bld 149 (*)    Calcium 10.4 (*)    AST 46 (*)    All other components within normal limits  URINALYSIS, ROUTINE W REFLEX MICROSCOPIC - Abnormal; Notable for the following:    APPearance HAZY (*)    Hgb urine dipstick SMALL (*)    Protein, ur 100 (*)    Leukocytes, UA SMALL (*)    Bacteria, UA RARE (*)    Squamous Epithelial / LPF 6-30 (*)    All other components within normal limits  I-STAT CHEM 8, ED - Abnormal; Notable for the following:    Glucose, Bld 151 (*)    All other components within normal limits  URINE CULTURE  PROTIME-INR  APTT  CBC  DIFFERENTIAL  I-STAT TROPOININ, ED  CBG MONITORING, ED   ____________________________________________  EKG   EKG Interpretation  Date/Time:  Thursday January 09 2017 12:22:45 EDT Ventricular Rate:  53 PR Interval:  180 QRS Duration: 87 QT Interval:  409 QTC Calculation: 384 R Axis:   78 Text Interpretation:  Sinus rhythm Supraventricular bigeminy No STEMI.  Confirmed by Nanda Quinton [phone]) on 01/09/2017 1:49:34 PM       ____________________________________________  RADIOLOGY  Ct Head Wo Contrast  Result Date: 01/09/2017 CLINICAL DATA:  81 year old female with difficulty speaking since yesterday evening. No headache or other complaints. EXAM: CT HEAD WITHOUT CONTRAST TECHNIQUE: Contiguous axial images were obtained from the base of the skull through the vertex without intravenous contrast. COMPARISON:  Head CT 04/13/2015. FINDINGS: Brain: Patchy and confluent areas of decreased attenuation are noted throughout the deep and periventricular white matter of the cerebral hemispheres bilaterally, compatible with chronic microvascular ischemic disease. No evidence of acute infarction, hemorrhage, hydrocephalus, extra-axial collection or mass lesion/mass effect. Vascular: No hyperdense vessel or unexpected calcification. Skull: Normal. Negative for fracture or focal lesion. Sinuses/Orbits: Small amount of mucosal  thickening in the posterior right ethmoids. Small amount of dependent frothy secretions in the sphenoid sinus lying dependently. Trace left mastoid effusion. Other: None. IMPRESSION: 1. No acute intracranial abnormalities to account for the patient's symptoms. 2. Chronic microvascular ischemic changes in the cerebral white matter, as above. 3. Mild paranasal sinus disease and trace left mastoid effusion incidentally noted. Electronically Signed   By: Vinnie Langton M.D.   On: 01/09/2017 10:35    ____________________________________________   PROCEDURES  Procedure(s) performed:   Procedures  None ____________________________________________   INITIAL IMPRESSION / ASSESSMENT AND PLAN / ED COURSE  Pertinent labs & imaging results that were available during my care of the patient were reviewed by me and considered in my medical decision making (see chart for details).  Patient resents to the emergency department for evaluation of stuttering speech that started yesterday. Became more severe this morning so presented to the  ED. Only new medicine is Augmentin as post-op prevention after recent ureteral stent placement. No fever. Suspect possible TIA. Will follow UA, obtain MRI, and discuss with Neurology.   12:40 PM Discussed case with Neurology Dr. Rory Percy who will consult on patient. Paged admission team for TIA evaluation.   Discussed patient's case with Hospitalist. Patient and family (if present) updated with plan. Care transferred to Hospitalist service.  I reviewed all nursing notes, vitals, pertinent old records, EKGs, labs, imaging (as available).  ____________________________________________  FINAL CLINICAL IMPRESSION(S) / ED DIAGNOSES  Final diagnoses:  Transient cerebral ischemia, unspecified type  Aphasia     MEDICATIONS GIVEN DURING THIS VISIT:  Medications  cefTRIAXone (ROCEPHIN) 1 g in dextrose 5 % 50 mL IVPB (not administered)  0.9 %  sodium chloride infusion  (not administered)     NEW OUTPATIENT MEDICATIONS STARTED DURING THIS VISIT:  None   Note:  This document was prepared using Dragon voice recognition software and may include unintentional dictation errors.  Nanda Quinton, MD Emergency Medicine    Long, [facility], MD 01/09/17 1440

## 2017-01-09 NOTE — ED Notes (Signed)
On way to MRI 

## 2017-01-09 NOTE — ED Triage Notes (Signed)
Pt here for aphasia and some slurred speech starting yesterday; pt denies any other weakness

## 2017-01-09 NOTE — ED Notes (Signed)
Given ginger ale and peanut butter/crackers.

## 2017-01-10 ENCOUNTER — Inpatient Hospital Stay (HOSPITAL_COMMUNITY): Payer: Medicare HMO

## 2017-01-10 DIAGNOSIS — I360 Nonrheumatic tricuspid (valve) stenosis: Secondary | ICD-10-CM | POA: Diagnosis not present

## 2017-01-10 DIAGNOSIS — Z96653 Presence of artificial knee joint, bilateral: Secondary | ICD-10-CM | POA: Diagnosis present

## 2017-01-10 DIAGNOSIS — R4701 Aphasia: Secondary | ICD-10-CM | POA: Diagnosis present

## 2017-01-10 DIAGNOSIS — E781 Pure hyperglyceridemia: Secondary | ICD-10-CM | POA: Diagnosis present

## 2017-01-10 DIAGNOSIS — Z7983 Long term (current) use of bisphosphonates: Secondary | ICD-10-CM | POA: Diagnosis not present

## 2017-01-10 DIAGNOSIS — N390 Urinary tract infection, site not specified: Secondary | ICD-10-CM | POA: Diagnosis present

## 2017-01-10 DIAGNOSIS — E119 Type 2 diabetes mellitus without complications: Secondary | ICD-10-CM | POA: Diagnosis present

## 2017-01-10 DIAGNOSIS — K219 Gastro-esophageal reflux disease without esophagitis: Secondary | ICD-10-CM | POA: Diagnosis present

## 2017-01-10 DIAGNOSIS — I63412 Cerebral infarction due to embolism of left middle cerebral artery: Secondary | ICD-10-CM | POA: Diagnosis present

## 2017-01-10 DIAGNOSIS — I6930 Unspecified sequelae of cerebral infarction: Secondary | ICD-10-CM | POA: Diagnosis present

## 2017-01-10 DIAGNOSIS — G9341 Metabolic encephalopathy: Secondary | ICD-10-CM | POA: Diagnosis present

## 2017-01-10 DIAGNOSIS — M810 Age-related osteoporosis without current pathological fracture: Secondary | ICD-10-CM | POA: Diagnosis present

## 2017-01-10 DIAGNOSIS — F17210 Nicotine dependence, cigarettes, uncomplicated: Secondary | ICD-10-CM | POA: Diagnosis present

## 2017-01-10 DIAGNOSIS — R471 Dysarthria and anarthria: Secondary | ICD-10-CM | POA: Diagnosis present

## 2017-01-10 DIAGNOSIS — Z1611 Resistance to penicillins: Secondary | ICD-10-CM | POA: Diagnosis present

## 2017-01-10 DIAGNOSIS — M1990 Unspecified osteoarthritis, unspecified site: Secondary | ICD-10-CM | POA: Diagnosis present

## 2017-01-10 DIAGNOSIS — I4891 Unspecified atrial fibrillation: Secondary | ICD-10-CM | POA: Diagnosis present

## 2017-01-10 DIAGNOSIS — R29700 NIHSS score 0: Secondary | ICD-10-CM | POA: Diagnosis present

## 2017-01-10 DIAGNOSIS — Z1624 Resistance to multiple antibiotics: Secondary | ICD-10-CM | POA: Diagnosis present

## 2017-01-10 DIAGNOSIS — I10 Essential (primary) hypertension: Secondary | ICD-10-CM | POA: Diagnosis present

## 2017-01-10 DIAGNOSIS — J382 Nodules of vocal cords: Secondary | ICD-10-CM | POA: Diagnosis present

## 2017-01-10 DIAGNOSIS — F419 Anxiety disorder, unspecified: Secondary | ICD-10-CM | POA: Diagnosis present

## 2017-01-10 DIAGNOSIS — Z8673 Personal history of transient ischemic attack (TIA), and cerebral infarction without residual deficits: Secondary | ICD-10-CM | POA: Diagnosis present

## 2017-01-10 DIAGNOSIS — E785 Hyperlipidemia, unspecified: Secondary | ICD-10-CM | POA: Diagnosis present

## 2017-01-10 DIAGNOSIS — Z961 Presence of intraocular lens: Secondary | ICD-10-CM | POA: Diagnosis present

## 2017-01-10 DIAGNOSIS — Z96641 Presence of right artificial hip joint: Secondary | ICD-10-CM | POA: Diagnosis present

## 2017-01-10 DIAGNOSIS — Z87440 Personal history of urinary (tract) infections: Secondary | ICD-10-CM | POA: Diagnosis not present

## 2017-01-10 LAB — LIPID PANEL
CHOLESTEROL: 222 mg/dL — AB (ref 0–200)
HDL: 31 mg/dL — AB (ref >40)
LDL Cholesterol: UNDETERMINED mg/dL (ref 0–99)
Total CHOL/HDL Ratio: 7.2 RATIO
Triglycerides: 490 mg/dL — ABNORMAL HIGH (ref <150)
VLDL: UNDETERMINED mg/dL (ref 0–40)

## 2017-01-10 LAB — ECHOCARDIOGRAM COMPLETE
AOASC: 29 cm
CHL CUP MV DEC (S): 324
CHL CUP RV SYS PRESS: 39 mmHg
EWDT: 324 ms
FS: 30 % (ref 28–44)
Height: 71 in
IV/PV OW: 1.45
LA diam index: 1.75 cm/m2
LA vol: 52.8 mL
LASIZE: 39 mm
LAVOLA4C: 50.4 mL
LAVOLIN: 23.6 mL/m2
LEFT ATRIUM END SYS DIAM: 39 mm
LVOT area: 2.54 cm2
LVOT diameter: 18 mm
MV pk A vel: 126 m/s
MV pk E vel: 108 m/s
MVAP: 2.32 cm2
MVPG: 5 mmHg
MVSPHT: 95 ms
PW: 10.4 mm — AB (ref 0.6–1.1)
Reg peak vel: 302 cm/s
TAPSE: 19.9 mm
TR max vel: 302 cm/s
Weight: 3440 oz

## 2017-01-10 LAB — BASIC METABOLIC PANEL
Anion gap: 6 (ref 5–15)
BUN: 9 mg/dL (ref 6–20)
CHLORIDE: 108 mmol/L (ref 101–111)
CO2: 25 mmol/L (ref 22–32)
Calcium: 10.1 mg/dL (ref 8.9–10.3)
Creatinine, Ser: 0.66 mg/dL (ref 0.44–1.00)
GFR calc non Af Amer: >60 mL/min (ref >60)
Glucose, Bld: 152 mg/dL — ABNORMAL HIGH (ref 65–99)
POTASSIUM: 3.8 mmol/L (ref 3.5–5.1)
SODIUM: 139 mmol/L (ref 135–145)

## 2017-01-10 LAB — URINE CULTURE

## 2017-01-10 MED ORDER — APIXABAN 5 MG PO TABS
5.0000 mg | ORAL_TABLET | Freq: Two times a day (BID) | ORAL | Status: DC
  Administered 2017-01-10 – 2017-01-11 (×3): 5 mg via ORAL
  Filled 2017-01-10 (×3): qty 1

## 2017-01-10 NOTE — Progress Notes (Signed)
OT Cancellation Note  Patient Details Name: LETANYA FROH MRN: [id number] DOB: 05-22-1936   Cancelled Treatment:    Reason Eval/Treat Not Completed: After consulting with PT, OT screened, no needs identified, will sign off  Jaci Carrel 01/10/2017, 12:37 PM  Hulda Humphrey OTR/L [phone]

## 2017-01-10 NOTE — Discharge Instructions (Signed)

## 2017-01-10 NOTE — Progress Notes (Signed)
Pt started on Eliquis. CM provided her the 30 day free card. Patient is going to check with her pharmacy on the cost after the 30 days. She will inform her PCP if the cost is too much.

## 2017-01-10 NOTE — Progress Notes (Signed)
STROKE TEAM PROGRESS NOTE   HISTORY OF PRESENT ILLNESS (per record) Caitlin Mccarthy is a 81 y.o. female with a history of atrial fibrillation, diabetes mellitus, hypercoagulable state, hyperlipidemia, hypertension and smoking who presented with aphasia and slurred speech starting 7/11.  She was unable "to get [her] words out", although she knew what she wanted to say and recalls being about to understand those around her. Caitlin Mccarthy stated that these symptoms resolved sometime this morning. She denies any numbness or weakness in her extremities, dizziness, blurry or double vision.  Daughter at bedside stated that she was repeating 'uh' yesterday and not babbling. Further, she appeared confused in a manner that is typical of when she has a UTI.  Per her daughter, Caitlin Mccarthy is almost back to normal, just slightly more confused than baseline and occasionally slurring her words. Notably, she does not have a diagnosis of atrial fibrillation, nor is she on any anticoagulation, but was switching between NSR with PACs and afib on my visit. She denies chest pain and palpitations.  CT head 7/12: No acute abnormalities. Chronic small vessel disease.   MR brain pending  Date last known well: Date: 01/08/2017 Time last known well: Unable to determine Modified Rankin Score: 1  Patient was not administered IV t-PA secondary to arriving outside of the treatment window. She was admitted to General Neurology for further evaluation and treatment.   SUBJECTIVE (INTERVAL HISTORY) Her daughter is at the bedside.  The patient is awake, alert, and follows all commands appropriately.  Afib without anticoagulation.  Started on Eliquis.   OBJECTIVE Temp:  [97.6 F (36.4 C)-98.8 F (37.1 C)] 98.8 F (37.1 C) (07/12 2020) Pulse Rate:  [60-72] 71 (07/13 0419) Cardiac Rhythm: Atrial fibrillation (07/12 2020) Resp:  [16-25] 18 (07/13 0419) BP: (127-161)/(46-76) 160/50 (07/13 0419) SpO2:  [92 %-100 %] 95 % (07/13  0419)  CBC:  Recent Labs Lab 01/09/17 0928 01/09/17 0959  WBC 4.9  --   NEUTROABS 2.4  --   HGB 13.0 14.3  HCT 40.5 42.0  MCV 92.5  --   PLT 299  --     Basic Metabolic Panel:  Recent Labs Lab 01/09/17 0928 01/09/17 0959 01/10/17 0405  NA 137 139 139  K 4.3 4.2 3.8  CL 105 104 108  CO2 24  --  25  GLUCOSE 149* 151* 152*  BUN 8 11 9   CREATININE 0.66 0.60 0.66  CALCIUM 10.4*  --  10.1    Lipid Panel:    Component Value Date/Time   CHOL 222 (H) 01/10/2017 0405   CHOL 278 (H) 10/10/2016 1138   CHOL 158 10/02/2012 1301   TRIG 490 (H) 01/10/2017 0405   TRIG 229 (H) 11/24/2014 1126   TRIG 126 10/02/2012 1301   HDL 31 (L) 01/10/2017 0405   HDL 40 10/10/2016 1138   HDL 42 11/24/2014 1126   HDL 41 10/02/2012 1301   CHOLHDL 7.2 01/10/2017 0405   VLDL UNABLE TO CALCULATE IF TRIGLYCERIDE OVER 400 mg/dL 01/10/2017 0405   LDLCALC UNABLE TO CALCULATE IF TRIGLYCERIDE OVER 400 mg/dL 01/10/2017 0405   LDLCALC 173 (H) 10/10/2016 1138   LDLCALC 100 (H) 11/29/2013 1223   LDLCALC 92 10/02/2012 1301   HgbA1c: No results found for: HGBA1C Urine Drug Screen: No results found for: LABOPIA, COCAINSCRNUR, LABBENZ, AMPHETMU, THCU, LABBARB  Alcohol Level No results found for: ETH  IMAGING  Ct Angio Head and Neck W and Wo Contrast 01/09/2017 IMPRESSION: CTA neck: 1. No large vessel  occlusion, aneurysm, or high-grade stenosis of the carotid and vertebral arteries. 2. Mixed plaque of left vertebral artery with several small soft plaque ulcerations. 3. Enhancing nodule measuring up to 10 mm within the right anterior larynx at level of false cords effacing right paraglottic fat, possibly arising from the right laryngeal ventricle. Laryngeal carcinoma is possible, ENT consultation recommended. CTA head: 1. No large vessel occlusion, aneurysm, or significant stenosis of the circle of Willis. 2. Intracranial atherosclerosis with areas of mild stenosis in the anterior and posterior circulation.     Ct Head Wo Contrast 01/09/2017 IMPRESSION: 1. No acute intracranial abnormalities to account for the patient's symptoms. 2. Chronic microvascular ischemic changes in the cerebral white matter, as above. 3. Mild paranasal sinus disease and trace left mastoid effusion incidentally noted.  Mr Brain Wo Contrast 01/09/2017 IMPRESSION: Scattered small embolic infarctions within the left MCA territory affecting the posterior frontal and parietal region. No swelling or hemorrhage. Mild chronic small-vessel ischemic change of the white matter elsewhere.       PHYSICAL EXAM Pleasant elderly Caucasian lady not in distress. . Afebrile. Head is nontraumatic. Neck is supple without bruit.    Cardiac exam no murmur or gallop. Lungs are clear to auscultation. Distal pulses are well felt. Neurological Exam ;  Awake  Alert oriented x 3. Normal speech and language.eye movements full without nystagmus.fundi were not visualized. Vision acuity and fields appear normal. Hearing is normal. Palatal movements are normal. Face symmetric. Tongue midline. Normal strength, tone, reflexes and coordination. Normal sensation. Gait deferred.  ASSESSMENT/PLAN Ms. Caitlin Mccarthy is a 81 y.o. female with history of atrial fibrillation, diabetes mellitus, hypercoagulable state, hyperlipidemia, hypertension and smoking who presented with aphasia and slurred speech starting 7/11. She did not receive IV t-PA due to arriving outside of the treatment window.   Stroke: Scattered small embolic infarctions within the left MCA territory affecting the posterior frontal and parietal region secondary to atrial fibrillation not on chronic anticoagulation  Resultant  No deficits  CT head: no stroke  MRI head:  Scattered small embolic infarctions within the left MCA territory affecting the posterior frontal and parietal region. No swelling or hemorrhage. Mild chronic small-vessel ischemic change of the white matter elsewhere.   CTA  head/neck: CTA neck: 1. No LVO, aneurysm, or high-grade stenosis of the carotid and vertebral arteries. 2. Diffuse atherosclerosis, 3. Incidental finding of enhancing laryngeal nodule, CTA head: 1. No large vessel occlusion, aneurysm, or significant stenosis of the circle of Willis. 2. Intracranial atherosclerosis with areas of mild stenosis in the anterior and posterior circulation.    2D Echo: EF 55-60%. No source of embolus  LDL > 400  HgbA1c pending   SCDs for VTE prophylaxis  Diet Heart Room service appropriate? Yes; Fluid consistency: Thin  No antithrombotic prior to admission, now on Eliquis (apixaban) daily   Patient counseled to be compliant with her antithrombotic medications  Ongoing aggressive stroke risk factor management  Therapy recommendations:   pending  Disposition:  pending  Hypertension  Stable  Permissive hypertension (OK if < 220/120) but gradually normalize in 5-7 days  Long-term BP goal normotensive  Hyperlipidemia  Home meds: rosuvastatin 20mg  PO daily  LDL >400, goal < 70  Switched to atorvastatin 80mg  PO daily  Continue statin at discharge  Diabetes  HgbA1c pending, goal < 7.0  Controlled  Other Stroke Risk Factors  Advanced age  Cigarette smoker, advised to stop smoking  Other Active Problems  None  Hospital day # 0  I have personally examined this patient, reviewed notes, independently viewed imaging studies, participated in medical decision making and plan of care.ROS completed by me personally and pertinent positives fully documented  I have made any additions or clarifications directly to the above note.  She presented with transient expressive aphasia secondary to embolic left MCA branch infarct from atrial fibrillation. Recommend change aspirin to eliquis for anticoagulation and add statin. Continue ongoing stroke workup. Long discussion at the bedside with the patient and daughter and answered questions about her embolic  stroke, anticoagulation and risk-benefit discussion. Greater than 50% time during this 25 minute visit was spent on counseling and coordination of care about atrial fibrillation, embolic stroke, discussion about risk-benefit of long-term anticoagulation and answered questions. Discussed with Dr. Hillard Danker, MD Medical Director [facility] Pager: [phone] 01/10/2017 4:29 PM   To contact Stroke Continuity provider, please refer to [email or web address] After hours, contact General Neurology

## 2017-01-10 NOTE — Care Management Note (Signed)
Case Management Note  Patient Details  Name: Caitlin Mccarthy MRN: [id number] Date of Birth: May 19, 1936  Subjective/Objective:     Pt admitted with CVA. She is from home with her spouse.                Action/Plan: Awaiting PT/OT recommendations. CM following for d/c needs, physician orders.   Expected Discharge Date:                  Expected Discharge Plan:     In-House Referral:     Discharge planning Services     Post Acute Care Choice:    Choice offered to:     DME Arranged:    DME Agency:     HH Arranged:    HH Agency:     Status of Service:  In process, will continue to follow  If discussed at Long Length of Stay Meetings, dates discussed:    Additional Comments:  Pollie Friar, RN 01/10/2017, 11:46 AM

## 2017-01-10 NOTE — Progress Notes (Signed)
  Echocardiogram 2D Echocardiogram has been performed.  Caitlin Mccarthy [name] 01/10/2017, 1:47 PM

## 2017-01-10 NOTE — Evaluation (Signed)
Physical Therapy Evaluation Patient Details Name: Caitlin Mccarthy MRN: [id number] DOB: 1935/10/08 Today's Date: 01/10/2017   History of Present Illness  Pt is a 81 y/o female admitted from home secondary to dysarthria. 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 PMH including but not limited to HLD, HTN and DM.  Clinical Impression  Pt presented supine in bed with HOB elevated, awake and willing to participate in therapy session. Prior to admission, pt reported that she was independent with ADLs and mod I with use of rollator to ambulate. Pt ambulated in hallway with use of RW and supervision for safety. Pt with mild instability but no overt LOB or need for physical assistance. Pt appears to be back to her baseline with functional mobility. No further acute PT needs identified at this time. PT signing off.    Follow Up Recommendations No PT follow up    Equipment Recommendations  None recommended by PT    Recommendations for Other Services       Precautions / Restrictions Precautions Precautions: Fall Restrictions Weight Bearing Restrictions: No      Mobility  Bed Mobility Overal bed mobility: Modified Independent                Transfers Overall transfer level: Modified independent Equipment used: Rolling walker (2 wheeled)                Ambulation/Gait Ambulation/Gait assistance: Supervision Ambulation Distance (Feet): 150 Feet Assistive device: Rolling walker (2 wheeled) Gait Pattern/deviations: Step-through pattern;Decreased stride length Gait velocity: decreased Gait velocity interpretation: Below normal speed for age/gender General Gait Details: mild instability but no overt LOB or need for physical assistance, supervision for safety  Stairs            Wheelchair Mobility    Modified Rankin (Stroke Patients Only) Modified Rankin (Stroke Patients Only) Pre-Morbid Rankin Score: Moderately severe disability Modified Rankin: Moderately severe  disability     Balance Overall balance assessment: Needs assistance Sitting-balance support: Feet supported Sitting balance-Leahy Scale: Normal     Standing balance support: During functional activity;Bilateral upper extremity supported Standing balance-Leahy Scale: Poor Standing balance comment: reliant on bilateral UEs on RW                             Pertinent Vitals/Pain Pain Assessment: No/denies pain    Home Living Family/patient expects to be discharged to:: Private residence Living Arrangements: Spouse/significant other Available Help at Discharge: Family;Available PRN/intermittently Type of Home: House Home Access: Stairs to enter Entrance Stairs-Rails: None Entrance Stairs-Number of Steps: 1 Home Layout: One level Home Equipment: Clinical cytogeneticist - 4 wheels;Shower seat - built in;Cane - single point      Prior Function Level of Independence: Independent with assistive device(s)         Comments: pt ambulates with use of rollator     Hand Dominance   Dominant Hand: Right    Extremity/Trunk Assessment   Upper Extremity Assessment Upper Extremity Assessment: Overall WFL for tasks assessed    Lower Extremity Assessment Lower Extremity Assessment: Overall WFL for tasks assessed    Cervical / Trunk Assessment Cervical / Trunk Assessment: Normal  Communication   Communication: No difficulties  Cognition Arousal/Alertness: Awake/alert Behavior During Therapy: WFL for tasks assessed/performed Overall Cognitive Status: Within Functional Limits for tasks assessed  General Comments      Exercises     Assessment/Plan    PT Assessment Patent does not need any further PT services  PT Problem List         PT Treatment Interventions      PT Goals (Current goals can be found in the Care Plan section)  Acute Rehab PT Goals Patient Stated Goal: return home today    Frequency      Barriers to discharge        Co-evaluation               AM-PAC PT "6 Clicks" Daily Activity  Outcome Measure Difficulty turning over in bed (including adjusting bedclothes, sheets and blankets)?: None Difficulty moving from lying on back to sitting on the side of the bed? : None Difficulty sitting down on and standing up from a chair with arms (e.g., wheelchair, bedside commode, etc,.)?: A Little Help needed moving to and from a bed to chair (including a wheelchair)?: None Help needed walking in hospital room?: None Help needed climbing 3-5 steps with a railing? : A Little 6 Click Score: 22    End of Session   Activity Tolerance: Patient tolerated treatment well Patient left: in bed;with call bell/phone within reach;with family/visitor present Nurse Communication: Mobility status PT Visit Diagnosis: Other symptoms and signs involving the nervous system (R29.898)    Time: 1100-1114 PT Time Calculation (min) (ACUTE ONLY): 14 min   Charges:   PT Evaluation $PT Eval Moderate Complexity: 1 Procedure     PT G Codes:        Sherie Don, PT, DPT [place] 01/10/2017, 11:49 AM

## 2017-01-10 NOTE — Progress Notes (Signed)
ANTICOAGULATION CONSULT NOTE - Initial Consult  Pharmacy Consult for Eliquis Indication: atrial fibrillation and stroke  No Known Allergies  Patient Measurements: Height: 5\' 11"  (180.3 cm) Weight: 215 lb (97.5 kg) IBW/kg (Calculated) : 70.8  Vital Signs: Temp: 97.6 F (36.4 C) (07/13 0930) Temp Source: Oral (07/13 0930) BP: 141/74 (07/13 0930) Pulse Rate: 64 (07/13 0930)  Labs:  Recent Labs  01/09/17 0928 01/09/17 0959 01/10/17 0405  HGB 13.0 14.3  --   HCT 40.5 42.0  --   PLT 299  --   --   APTT 33  --   --   LABPROT 13.0  --   --   INR 0.99  --   --   CREATININE 0.66 0.60 0.66    Estimated Creatinine Clearance: 74.6 mL/min (by C-G formula based on SCr of 0.66 mg/dL).   Medical History: Past Medical History:  Diagnosis Date  . Anxiety   . Depression   . GERD (gastroesophageal reflux disease)   . History of acute pyelonephritis    last episode 04-13-2015  w/ sepsis  . History of kidney stones   . History of recurrent UTIs    MULTIPLE  . Hyperlipidemia   . Hypertension   . OA (osteoarthritis)    knees, hips, hands  . Osteoporosis   . Poor memory    especially when has uti  . Renal calculus, left   . Type 2 diabetes mellitus ([place])   . Vitamin D deficiency 05/10/2016   Assessment:  81 yr old female admitted 4/62 with new embolic stroke.  Aspirin 325 mg PO daily begun 7/12 pm and given this morning. Now to begin Eliquis.  Lovenox 40 mg sq q24h begin 7/12 pm for VTE prophylaxis.  One dose given ~7pm on 01/09/17.        For full-dose Eliquis.  81 yrs old, 97.5 kg, Scr 0.66.  Goal of Therapy:  appropriate Eliquis dose for indication Monitor platelets by anticoagulation protocol: Yes   Plan:   Discontinue Lovenox.  Begin Eliquis 5 mg PO BID.  Decrease Aspirin 325 mg to 81 mg or stop?  Arty Baumgartner, [place] Pager: [phone] 01/10/2017,12:17 PM

## 2017-01-10 NOTE — Evaluation (Signed)
Speech Language Pathology Evaluation Patient Details Name: Caitlin Mccarthy MRN: [id number] DOB: 08-25-1935 Today's Date: 01/10/2017 Time: 3545-6256 SLP Time Calculation (min) (ACUTE ONLY): 15 min  Problem List:  Patient Active Problem List   Diagnosis Date Noted  . CVA (cerebral vascular accident) ([place]) 01/10/2017  . Hypertension 01/09/2017  . OA (osteoarthritis) 01/09/2017  . History of kidney stones 01/09/2017  . Hyperlipidemia 01/09/2017  . Abnormal urinalysis 01/09/2017  . Acute metabolic encephalopathy 38/93/7342  . Dysarthria 01/09/2017  . Anxiety 01/09/2017  . Embolic stroke ([place]) 87/68/1157  . Glaucoma 05/10/2016  . Vitamin D deficiency 05/10/2016  . Current smoker 05/10/2016  . Osteoarthritis of both hands 05/10/2016  . History of total right hip replacement 05/10/2016  . History of total knee replacement, bilateral 05/10/2016  . Abnormal immunoelectrophoresis 05/10/2016  . Osteoporosis 05/09/2016  . Type 2 diabetes mellitus ([place]) 02/05/2016  . Overweight (BMI 25.0-29.9) 12/28/2015  . GAD (generalized anxiety disorder) 12/28/2015  . Hypokalemia 06/02/2015  . Acute encephalopathy 04/13/2015  . Chronic pain 04/13/2015  . Metabolic syndrome 26/20/3559  . Arthritis 11/29/2013  . Essential hypertension, benign 10/02/2012  . Hyperlipidemia with target LDL less than 100 10/02/2012  . GERD (gastroesophageal reflux disease) 10/02/2012  . Depression 10/02/2012  . OA (osteoarthritis) of hip 10/28/2011   Past Medical History:  Past Medical History:  Diagnosis Date  . Anxiety   . Depression   . GERD (gastroesophageal reflux disease)   . History of acute pyelonephritis    last episode 04-13-2015  w/ sepsis  . History of kidney stones   . History of recurrent UTIs    MULTIPLE  . Hyperlipidemia   . Hypertension   . OA (osteoarthritis)    knees, hips, hands  . Osteoporosis   . Poor memory    especially when has uti  . Renal calculus, left   . Type 2 diabetes  mellitus ([place])   . Vitamin D deficiency 05/10/2016   Past Surgical History:  Past Surgical History:  Procedure Laterality Date  . CATARACT EXTRACTION W/PHACO  06/06/2011   Procedure: CATARACT EXTRACTION PHACO AND INTRAOCULAR LENS PLACEMENT (IOC);  Surgeon: Tonny Branch;  Location: [facility];  Service: Ophthalmology;  Laterality: Right;  CDE=12.77  . CATARACT EXTRACTION W/PHACO  06/27/2011   Procedure: CATARACT EXTRACTION PHACO AND INTRAOCULAR LENS PLACEMENT (IOC);  Surgeon: Tonny Branch;  Location: [facility];  Service: Ophthalmology;  Laterality: Left;  CDE:13.96  . CYSTOSCOPY WITH URETEROSCOPY AND STENT PLACEMENT Right 04/27/2015   Procedure: CYSTOSCOPY WITH RIGHT URETEROSCOPY, BASKET REMOVAL OF STONE, REMOVAL OF RIGHT NEPHROSTOMY TUBE;  Surgeon: Irine Seal, MD;  Location: [facility];  Service: Urology;  Laterality: Right;  . CYSTOSCOPY/URETEROSCOPY/HOLMIUM LASER/STENT PLACEMENT Left 12/24/2016   Procedure: LEFT URETEROSCOPY WITH HOLMIUM LASER AND STENT PLACEMENT;  Surgeon: Irine Seal, MD;  Location[facility];  Service: Urology;  Laterality: Left;  . EXTRACORPOREAL SHOCK WAVE LITHOTRIPSY  left 04-01-2016;  1980's  . FRACTURE SURGERY    . HOLMIUM LASER APPLICATION Right 74/16/3845   Procedure: HOLMIUM LASER APPLICATION;  Surgeon: Irine Seal, MD;  Location[facility];  Service: Urology;  Laterality: Right;  . KNEE ARTHROSCOPY Bilateral right 2005//  left ?  [name] TOTAL HIP ARTHROPLASTY  10/28/2011   Procedure: TOTAL HIP ARTHROPLASTY;  Surgeon: Gearlean Alf, MD;  Location: [facility];  Service: Orthopedics;  Laterality: Right;  . TOTAL KNEE ARTHROPLASTY Bilateral left 03-09-2007//  right 2006  . TRANSTHORACIC ECHOCARDIOGRAM  10/06/2006   normal echo,  ef 55-60%  . TUBAL LIGATION     HPI:  Pt is a 81 y/o female admitted from home secondary to dysarthria. 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 PMH including but not limited to HLD, HTN and DM.   Assessment  / Plan / Recommendation Clinical Impression  Patient and her daughter report she is functioning at cognitive-linguistic baseline. Pt denies changes in speech, language, thinking. Spontaneous speech within normal limits, Y/N questions 10/10, sequential commands 10/10 (with one repetition), repetition 9/10, naming 10/10. Provided education re: potential deficits after stroke. No further skilled ST recommended at this time. Education complete; SLP will s/o.    SLP Assessment  SLP Recommendation/Assessment: Patient does not need any further Speech Lanaguage Pathology Services SLP Visit Diagnosis: Aphasia (R47.01)    Follow Up Recommendations  None    Frequency and Duration           SLP Evaluation Cognition  Overall Cognitive Status: Within Functional Limits for tasks assessed Arousal/Alertness: Awake/alert Orientation Level: Oriented X4 Attention: Sustained Sustained Attention: Appears intact Memory: Appears intact Awareness: Appears intact Problem Solving: Appears intact       Comprehension  Auditory Comprehension Overall Auditory Comprehension: Appears within functional limits for tasks assessed Yes/No Questions: Within Functional Limits Commands: Within Functional Limits Conversation: Complex Visual Recognition/Discrimination Discrimination: Within Function Limits Reading Comprehension Reading Status: Not tested    Expression Expression Primary Mode of Expression: Verbal Verbal Expression Overall Verbal Expression: Appears within functional limits for tasks assessed Written Expression Dominant Hand: Right Written Expression: Not tested   Oral / Motor  Oral Motor/Sensory Function Overall Oral Motor/Sensory Function: Within functional limits Motor Speech Overall Motor Speech: Appears within functional limits for tasks assessed Respiration: Within functional limits Phonation: Normal Resonance: Within functional limits Articulation: Within functional  limitis Intelligibility: Intelligible Motor Planning: Witnin functional limits Motor Speech Errors: Not applicable   [place], [place], [place] Pathologist [phone]  Aliene Altes 01/10/2017, 2:15 PM

## 2017-01-10 NOTE — Progress Notes (Signed)
[facility] PROGRESS NOTE  Caitlin Mccarthy WJX:[id number] DOB: 1935/07/18 DOA: 01/09/2017   PCP: [name], FNP     Assessment/Plan: Principal Problem:   Dysarthria Active Problems:   Hypertension   OA (osteoarthritis)   History of kidney stones   Hyperlipidemia   Abnormal urinalysis   Acute metabolic encephalopathy   Anxiety   Embolic stroke (HCC)   CVA (cerebral vascular accident) ([place])     81 y.o. female with a Past Medical History significant for hypertension, hyperlipidemia and recurrent UTIs who presents with dysarthria and aphasia. Patient found to have acute embolic infarct on MRI. Stroke workup pending  Assessment and plan Acute Cardioembolic CVA 1. [id number] pending, fasting lipid panel -triglycerides 490, LDL not calculated 2. MRI brain showed scattered embolic infarctions,  CTA head and neck does not show large vessel occlusion or aneurysm 3. Frequent neuro checks 4. Echocardiogram pending 5. Carotid dopplers pending 6. Neurology had a high suspicion for embolic CVA and started patient on eliquis, ASA discontinued  7. Risk factor modification 8. Telemetry monitoring -first-degree AV block ,PAC's 9. PT consult, OT consult, Speech consult pending    Hypertriglyceridemia Patient already on Lipitor and fenofibrate  10 mm laryngeal nodule Patient will need outpatient ENT consultation  ? Atrial Fibrillation sv PAC's  -EKG documents PACs -Current telemetry with sinus rhythm, biphasic P waves and frequent PACs;   highly suspicious of underlying atrial fibrillation- patient may need TEE/ILR -Continue telemetry 2-D echo pending -CHADVASc = 4 so if determined to have atrial fibrillation will need to begin anticoagulation once appropriate after acute CVA    Hypertension -Current blood pressure controlled -Continue preadmission amlodipine benazepril since renal function is normal    History of kidney stones -Patient has recently  undergone lithotripsy with double-J stent placement to left ureter on 6/26 with removal of double-J stent on 6/30 -Urologist is Dr. Jeffie Pollock -Creatinine is normal therefore do not suspect stent obstruction and/or hydronephrosis    Abnormal urinalysis -Presents with abnormal urinalysis ?? consistent with UTI  -Was on UTI prophylaxis with Augmentin prior to now started on Rocephin  Follow urine culture    OA (osteoarthritis)/osteoporosis/vitamin D deficiency -Continue home medications    Hyperlipidemia -Continue fenofibrate  -Crestor changed to high-dose Lipitor in setting of acute stroke   DVT prophylaxsis Lovenox  Code Status:  Full code    Family Communication: Discussed in detail with the patient/Daughter, all imaging results, lab results explained to the patient   Disposition Plan:  Anticipate discharge tomorrow      Consultants:   Neurology  Procedures: None  Antibiotics: Anti-infectives    Start     Dose/Rate Route Frequency Ordered Stop   01/09/17 1500  cefTRIAXone (ROCEPHIN) 1 g in dextrose 5 % 50 mL IVPB     1 g 100 mL/hr over 30 Minutes Intravenous Every 24 hours 01/09/17 1414           HPI/Subjective: Patient states that her symptoms have resolved, initial symptoms lasted about 20 minutes   Objective: Vitals:   01/10/17 0017 01/10/17 0204 01/10/17 0419 01/10/17 0930  BP: (!) 153/56 (!) 141/62 (!) 160/50 (!) 141/74  Pulse: 63 70 71 64  Resp: 16 16 18 16   Temp:    97.6 F (36.4 C)  TempSrc:    Oral  SpO2: 93% 92% 95% 96%    Intake/Output Summary (Last 24 hours) at 01/10/17 1026 Last data filed at 01/10/17 0420  Gross per 24 hour  Intake  1052.5 ml  Output                0 ml  Net           1052.5 ml    Exam:  Examination:  General exam: Appears calm and comfortable  Respiratory system: Clear to auscultation. Respiratory effort normal. Cardiovascular system: S1 & S2 heard, RRR. No JVD, murmurs, rubs, gallops or clicks. No  pedal edema. Gastrointestinal system: Abdomen is nondistended, soft and nontender. No organomegaly or masses felt. Normal bowel sounds heard. Central nervous system: Alert and oriented. No focal neurological deficits. Extremities: Symmetric 5 x 5 power. Skin: No rashes, lesions or ulcers Psychiatry: Judgement and insight appear normal. Mood & affect appropriate.     Data Reviewed: I have personally reviewed following labs and imaging studies  Micro Results No results found for this or any previous visit (from the past 240 hour(s)).  Radiology Reports Ct Angio Head W Or Wo Contrast  Result Date: 01/09/2017 CLINICAL DATA:  81 y/o  F; embolic stroke. EXAM: CT ANGIOGRAPHY HEAD AND NECK TECHNIQUE: Multidetector CT imaging of the head and neck was performed using the standard protocol during bolus administration of intravenous contrast. Multiplanar CT image reconstructions and MIPs were obtained to evaluate the vascular anatomy. Carotid stenosis measurements (when applicable) are obtained utilizing NASCET criteria, using the distal internal carotid diameter as the denominator. CONTRAST:  50 cc Isovue 370 COMPARISON:  None. FINDINGS: CT HEAD FINDINGS Brain: Scattered hypoattenuating foci in the left MCA territory corresponding to infarctions on the prior MRI. No evidence for new large territory infarct, intracranial hemorrhage, or significant mass effect. Mild chronic microvascular ischemic changes of white matter for age and parenchymal volume loss of the brain. Vascular: As below. Skull: Normal. Negative for fracture or focal lesion. Sinuses: Maxillary sinus fluid level. Orbits: Bilateral intra-ocular lens replacement. Review of the MIP images confirms the above findings CTA NECK FINDINGS Aortic arch: Standard branching. Imaged portion shows no evidence of aneurysm or dissection. No significant stenosis of the major arch vessel origins. Moderate mixed atherosclerotic plaque. Right carotid system: No  evidence of dissection, stenosis (50% or greater) or occlusion. Mild calcified plaque of the carotid bifurcation with minimal 20-30% stenosis. Moderate proximal tortuosity of ICA. Left carotid system: No evidence of dissection, stenosis (50% or greater) or occlusion. Moderate mixed plaque of the left carotid bifurcation without significant stenosis. Several small areas of plaque ulceration. Vertebral arteries: Codominant. No evidence of dissection, stenosis (50% or greater) or occlusion. Mixed plaque of left vertebral artery origin with mild approximately 50% stenosis. Moderate proximal tortuosity of ICA. Skeleton: Mild cervical spondylosis greatest at C5-6 level. No high-grade bony canal stenosis. Other neck: Left thyroid nodules measuring up to 7 mm. 8 x 7 x 10 mm enhancing nodule (AP x ML x CC series 5, image 120 and series 8, image 108) within the right anterior aspect of the larynx at level of false cords effacing right paraglottic fat, possibly within the right laryngeal ventricle. Right internal laryngocele. Upper chest: Negative. Review of the MIP images confirms the above findings CTA HEAD FINDINGS Anterior circulation: No significant stenosis, proximal occlusion, aneurysm, or vascular malformation. Atherosclerotic changes with calcifications in the carotid siphons multiple short segments of mild stenosis in the anterior middle cerebral artery distributions. Posterior circulation: No significant stenosis, proximal occlusion, aneurysm, or vascular malformation. Atherosclerotic changes with vertebral artery calcification in short segments of mild stenosis in the proximal posterior cerebral arteries. Venous sinuses: As permitted by contrast timing,  patent. Anatomic variants: Patent anterior and left posterior communicating arteries. No right posterior communicating artery identified, likely hypoplastic or absent. Delayed phase: No abnormal intracranial enhancement. Review of the MIP images confirms the above  findings IMPRESSION: CT head: 1. Scattered hypoattenuating foci and left MCA territory corresponding to acute infarcts on MRI. 2. No new acute infarct or intracranial hemorrhage identified. No abnormal enhancement of the brain. 3. Mild chronic microvascular ischemic changes of the brain. CTA neck: 1. No large vessel occlusion, aneurysm, or high-grade stenosis of the carotid and vertebral arteries. 2. Mixed plaque of left vertebral artery with several small soft plaque ulcerations. 3. Enhancing nodule measuring up to 10 mm within the right anterior larynx at level of false cords effacing right paraglottic fat, possibly arising from the right laryngeal ventricle. Laryngeal carcinoma is possible, ENT consultation recommended. CTA head: 1. No large vessel occlusion, aneurysm, or significant stenosis of the circle of Willis. 2. Intracranial atherosclerosis with areas of mild stenosis in the anterior and posterior circulation. These results will be called to the ordering clinician or representative by the Radiologist Assistant, and communication documented in the PACS or zVision Dashboard. Electronically Signed   By: Kristine Garbe M.D.   On: 01/09/2017 18:21   Ct Head Wo Contrast  Result Date: 01/09/2017 CLINICAL DATA:  81 year old female with difficulty speaking since yesterday evening. No headache or other complaints. EXAM: CT HEAD WITHOUT CONTRAST TECHNIQUE: Contiguous axial images were obtained from the base of the skull through the vertex without intravenous contrast. COMPARISON:  Head CT 04/13/2015. FINDINGS: Brain: Patchy and confluent areas of decreased attenuation are noted throughout the deep and periventricular white matter of the cerebral hemispheres bilaterally, compatible with chronic microvascular ischemic disease. No evidence of acute infarction, hemorrhage, hydrocephalus, extra-axial collection or mass lesion/mass effect. Vascular: No hyperdense vessel or unexpected calcification. Skull:  Normal. Negative for fracture or focal lesion. Sinuses/Orbits: Small amount of mucosal thickening in the posterior right ethmoids. Small amount of dependent frothy secretions in the sphenoid sinus lying dependently. Trace left mastoid effusion. Other: None. IMPRESSION: 1. No acute intracranial abnormalities to account for the patient's symptoms. 2. Chronic microvascular ischemic changes in the cerebral white matter, as above. 3. Mild paranasal sinus disease and trace left mastoid effusion incidentally noted. Electronically Signed   By: Vinnie Langton M.D.   On: 01/09/2017 10:35   Ct Angio Neck W Or Wo Contrast  Result Date: 01/09/2017 CLINICAL DATA:  81 y/o  F; embolic stroke. EXAM: CT ANGIOGRAPHY HEAD AND NECK TECHNIQUE: Multidetector CT imaging of the head and neck was performed using the standard protocol during bolus administration of intravenous contrast. Multiplanar CT image reconstructions and MIPs were obtained to evaluate the vascular anatomy. Carotid stenosis measurements (when applicable) are obtained utilizing NASCET criteria, using the distal internal carotid diameter as the denominator. CONTRAST:  50 cc Isovue 370 COMPARISON:  None. FINDINGS: CT HEAD FINDINGS Brain: Scattered hypoattenuating foci in the left MCA territory corresponding to infarctions on the prior MRI. No evidence for new large territory infarct, intracranial hemorrhage, or significant mass effect. Mild chronic microvascular ischemic changes of white matter for age and parenchymal volume loss of the brain. Vascular: As below. Skull: Normal. Negative for fracture or focal lesion. Sinuses: Maxillary sinus fluid level. Orbits: Bilateral intra-ocular lens replacement. Review of the MIP images confirms the above findings CTA NECK FINDINGS Aortic arch: Standard branching. Imaged portion shows no evidence of aneurysm or dissection. No significant stenosis of the major arch vessel origins. Moderate mixed atherosclerotic  plaque. Right  carotid system: No evidence of dissection, stenosis (50% or greater) or occlusion. Mild calcified plaque of the carotid bifurcation with minimal 20-30% stenosis. Moderate proximal tortuosity of ICA. Left carotid system: No evidence of dissection, stenosis (50% or greater) or occlusion. Moderate mixed plaque of the left carotid bifurcation without significant stenosis. Several small areas of plaque ulceration. Vertebral arteries: Codominant. No evidence of dissection, stenosis (50% or greater) or occlusion. Mixed plaque of left vertebral artery origin with mild approximately 50% stenosis. Moderate proximal tortuosity of ICA. Skeleton: Mild cervical spondylosis greatest at C5-6 level. No high-grade bony canal stenosis. Other neck: Left thyroid nodules measuring up to 7 mm. 8 x 7 x 10 mm enhancing nodule (AP x ML x CC series 5, image 120 and series 8, image 108) within the right anterior aspect of the larynx at level of false cords effacing right paraglottic fat, possibly within the right laryngeal ventricle. Right internal laryngocele. Upper chest: Negative. Review of the MIP images confirms the above findings CTA HEAD FINDINGS Anterior circulation: No significant stenosis, proximal occlusion, aneurysm, or vascular malformation. Atherosclerotic changes with calcifications in the carotid siphons multiple short segments of mild stenosis in the anterior middle cerebral artery distributions. Posterior circulation: No significant stenosis, proximal occlusion, aneurysm, or vascular malformation. Atherosclerotic changes with vertebral artery calcification in short segments of mild stenosis in the proximal posterior cerebral arteries. Venous sinuses: As permitted by contrast timing, patent. Anatomic variants: Patent anterior and left posterior communicating arteries. No right posterior communicating artery identified, likely hypoplastic or absent. Delayed phase: No abnormal intracranial enhancement. Review of the MIP images  confirms the above findings IMPRESSION: CT head: 1. Scattered hypoattenuating foci and left MCA territory corresponding to acute infarcts on MRI. 2. No new acute infarct or intracranial hemorrhage identified. No abnormal enhancement of the brain. 3. Mild chronic microvascular ischemic changes of the brain. CTA neck: 1. No large vessel occlusion, aneurysm, or high-grade stenosis of the carotid and vertebral arteries. 2. Mixed plaque of left vertebral artery with several small soft plaque ulcerations. 3. Enhancing nodule measuring up to 10 mm within the right anterior larynx at level of false cords effacing right paraglottic fat, possibly arising from the right laryngeal ventricle. Laryngeal carcinoma is possible, ENT consultation recommended. CTA head: 1. No large vessel occlusion, aneurysm, or significant stenosis of the circle of Willis. 2. Intracranial atherosclerosis with areas of mild stenosis in the anterior and posterior circulation. These results will be called to the ordering clinician or representative by the Radiologist Assistant, and communication documented in the PACS or zVision Dashboard. Electronically Signed   By: Kristine Garbe M.D.   On: 01/09/2017 18:21   Mr Brain Wo Contrast  Result Date: 01/09/2017 CLINICAL DATA:  Aphasia beginning yesterday. EXAM: MRI HEAD WITHOUT CONTRAST TECHNIQUE: Multiplanar, multiecho pulse sequences of the brain and surrounding structures were obtained without intravenous contrast. COMPARISON:  CT same day FINDINGS: Brain: Diffusion imaging shows scattered cortical and subcortical infarctions in the left posterior frontal and parietal region consistent with embolic infarctions in the left MCA territory. None of these is larger than a cm. No other vascular territory insult. Brainstem and cerebellum are normal. Cerebral hemispheres otherwise show chronic small-vessel ischemic changes of the deep and subcortical white matter. Old infarction of the anterior  corpus callosum. No mass lesion, hemorrhage, hydrocephalus or extra-axial collection. Vascular: Major vessels at the base of the brain show flow. Skull and upper cervical spine: Negative Sinuses/Orbits: Clear/normal Other: None significant IMPRESSION: Scattered small embolic  infarctions within the left MCA territory affecting the posterior frontal and parietal region. No swelling or hemorrhage. Mild chronic small-vessel ischemic change of the white matter elsewhere. Electronically Signed   By: Nelson Chimes M.D.   On: 01/09/2017 14:50     CBC  Recent Labs Lab 01/09/17 0928 01/09/17 0959  WBC 4.9  --   HGB 13.0 14.3  HCT 40.5 42.0  PLT 299  --   MCV 92.5  --   MCH 29.7  --   MCHC 32.1  --   RDW 13.7  --   LYMPHSABS 2.1  --   MONOABS 0.3  --   EOSABS 0.1  --   BASOSABS 0.0  --     Chemistries   Recent Labs Lab 01/09/17 0928 01/09/17 0959 01/10/17 0405  NA 137 139 139  K 4.3 4.2 3.8  CL 105 104 108  CO2 24  --  25  GLUCOSE 149* 151* 152*  BUN 8 11 9   CREATININE 0.66 0.60 0.66  CALCIUM 10.4*  --  10.1  AST 46*  --   --   ALT 33  --   --   ALKPHOS 65  --   --   BILITOT 0.6  --   --    ------------------------------------------------------------------------------------------------------------------ CrCl cannot be calculated (Unknown ideal weight.). ------------------------------------------------------------------------------------------------------------------ No results for input(s): HGBA1C in the last 72 hours. ------------------------------------------------------------------------------------------------------------------  Recent Labs  01/10/17 0405  CHOL 222*  HDL 31*  LDLCALC UNABLE TO CALCULATE IF TRIGLYCERIDE OVER 400 mg/dL  TRIG 490*  CHOLHDL 7.2   ------------------------------------------------------------------------------------------------------------------ No results for input(s): TSH, T4TOTAL, T3FREE, THYROIDAB in the last 72 hours.  Invalid  input(s): FREET3 ------------------------------------------------------------------------------------------------------------------ No results for input(s): VITAMINB12, FOLATE, FERRITIN, TIBC, IRON, RETICCTPCT in the last 72 hours.  Coagulation profile  Recent Labs Lab 01/09/17 0928  INR 0.99    No results for input(s): DDIMER in the last 72 hours.  Cardiac Enzymes No results for input(s): CKMB, TROPONINI, MYOGLOBIN in the last 168 hours.  Invalid input(s): CK ------------------------------------------------------------------------------------------------------------------ Invalid input(s): POCBNP   CBG: No results for input(s): GLUCAP in the last 168 hours.     Studies: Ct Angio Head W Or Wo Contrast  Result Date: 01/09/2017 CLINICAL DATA:  81 y/o  F; embolic stroke. EXAM: CT ANGIOGRAPHY HEAD AND NECK TECHNIQUE: Multidetector CT imaging of the head and neck was performed using the standard protocol during bolus administration of intravenous contrast. Multiplanar CT image reconstructions and MIPs were obtained to evaluate the vascular anatomy. Carotid stenosis measurements (when applicable) are obtained utilizing NASCET criteria, using the distal internal carotid diameter as the denominator. CONTRAST:  50 cc Isovue 370 COMPARISON:  None. FINDINGS: CT HEAD FINDINGS Brain: Scattered hypoattenuating foci in the left MCA territory corresponding to infarctions on the prior MRI. No evidence for new large territory infarct, intracranial hemorrhage, or significant mass effect. Mild chronic microvascular ischemic changes of white matter for age and parenchymal volume loss of the brain. Vascular: As below. Skull: Normal. Negative for fracture or focal lesion. Sinuses: Maxillary sinus fluid level. Orbits: Bilateral intra-ocular lens replacement. Review of the MIP images confirms the above findings CTA NECK FINDINGS Aortic arch: Standard branching. Imaged portion shows no evidence of aneurysm or  dissection. No significant stenosis of the major arch vessel origins. Moderate mixed atherosclerotic plaque. Right carotid system: No evidence of dissection, stenosis (50% or greater) or occlusion. Mild calcified plaque of the carotid bifurcation with minimal 20-30% stenosis. Moderate proximal tortuosity of ICA. Left carotid system:  No evidence of dissection, stenosis (50% or greater) or occlusion. Moderate mixed plaque of the left carotid bifurcation without significant stenosis. Several small areas of plaque ulceration. Vertebral arteries: Codominant. No evidence of dissection, stenosis (50% or greater) or occlusion. Mixed plaque of left vertebral artery origin with mild approximately 50% stenosis. Moderate proximal tortuosity of ICA. Skeleton: Mild cervical spondylosis greatest at C5-6 level. No high-grade bony canal stenosis. Other neck: Left thyroid nodules measuring up to 7 mm. 8 x 7 x 10 mm enhancing nodule (AP x ML x CC series 5, image 120 and series 8, image 108) within the right anterior aspect of the larynx at level of false cords effacing right paraglottic fat, possibly within the right laryngeal ventricle. Right internal laryngocele. Upper chest: Negative. Review of the MIP images confirms the above findings CTA HEAD FINDINGS Anterior circulation: No significant stenosis, proximal occlusion, aneurysm, or vascular malformation. Atherosclerotic changes with calcifications in the carotid siphons multiple short segments of mild stenosis in the anterior middle cerebral artery distributions. Posterior circulation: No significant stenosis, proximal occlusion, aneurysm, or vascular malformation. Atherosclerotic changes with vertebral artery calcification in short segments of mild stenosis in the proximal posterior cerebral arteries. Venous sinuses: As permitted by contrast timing, patent. Anatomic variants: Patent anterior and left posterior communicating arteries. No right posterior communicating artery  identified, likely hypoplastic or absent. Delayed phase: No abnormal intracranial enhancement. Review of the MIP images confirms the above findings IMPRESSION: CT head: 1. Scattered hypoattenuating foci and left MCA territory corresponding to acute infarcts on MRI. 2. No new acute infarct or intracranial hemorrhage identified. No abnormal enhancement of the brain. 3. Mild chronic microvascular ischemic changes of the brain. CTA neck: 1. No large vessel occlusion, aneurysm, or high-grade stenosis of the carotid and vertebral arteries. 2. Mixed plaque of left vertebral artery with several small soft plaque ulcerations. 3. Enhancing nodule measuring up to 10 mm within the right anterior larynx at level of false cords effacing right paraglottic fat, possibly arising from the right laryngeal ventricle. Laryngeal carcinoma is possible, ENT consultation recommended. CTA head: 1. No large vessel occlusion, aneurysm, or significant stenosis of the circle of Willis. 2. Intracranial atherosclerosis with areas of mild stenosis in the anterior and posterior circulation. These results will be called to the ordering clinician or representative by the Radiologist Assistant, and communication documented in the PACS or zVision Dashboard. Electronically Signed   By: Kristine Garbe M.D.   On: 01/09/2017 18:21   Ct Head Wo Contrast  Result Date: 01/09/2017 CLINICAL DATA:  81 year old female with difficulty speaking since yesterday evening. No headache or other complaints. EXAM: CT HEAD WITHOUT CONTRAST TECHNIQUE: Contiguous axial images were obtained from the base of the skull through the vertex without intravenous contrast. COMPARISON:  Head CT 04/13/2015. FINDINGS: Brain: Patchy and confluent areas of decreased attenuation are noted throughout the deep and periventricular white matter of the cerebral hemispheres bilaterally, compatible with chronic microvascular ischemic disease. No evidence of acute infarction,  hemorrhage, hydrocephalus, extra-axial collection or mass lesion/mass effect. Vascular: No hyperdense vessel or unexpected calcification. Skull: Normal. Negative for fracture or focal lesion. Sinuses/Orbits: Small amount of mucosal thickening in the posterior right ethmoids. Small amount of dependent frothy secretions in the sphenoid sinus lying dependently. Trace left mastoid effusion. Other: None. IMPRESSION: 1. No acute intracranial abnormalities to account for the patient's symptoms. 2. Chronic microvascular ischemic changes in the cerebral white matter, as above. 3. Mild paranasal sinus disease and trace left mastoid effusion incidentally noted. Electronically  Signed   By: Vinnie Langton M.D.   On: 01/09/2017 10:35   Ct Angio Neck W Or Wo Contrast  Result Date: 01/09/2017 CLINICAL DATA:  81 y/o  F; embolic stroke. EXAM: CT ANGIOGRAPHY HEAD AND NECK TECHNIQUE: Multidetector CT imaging of the head and neck was performed using the standard protocol during bolus administration of intravenous contrast. Multiplanar CT image reconstructions and MIPs were obtained to evaluate the vascular anatomy. Carotid stenosis measurements (when applicable) are obtained utilizing NASCET criteria, using the distal internal carotid diameter as the denominator. CONTRAST:  50 cc Isovue 370 COMPARISON:  None. FINDINGS: CT HEAD FINDINGS Brain: Scattered hypoattenuating foci in the left MCA territory corresponding to infarctions on the prior MRI. No evidence for new large territory infarct, intracranial hemorrhage, or significant mass effect. Mild chronic microvascular ischemic changes of white matter for age and parenchymal volume loss of the brain. Vascular: As below. Skull: Normal. Negative for fracture or focal lesion. Sinuses: Maxillary sinus fluid level. Orbits: Bilateral intra-ocular lens replacement. Review of the MIP images confirms the above findings CTA NECK FINDINGS Aortic arch: Standard branching. Imaged portion shows  no evidence of aneurysm or dissection. No significant stenosis of the major arch vessel origins. Moderate mixed atherosclerotic plaque. Right carotid system: No evidence of dissection, stenosis (50% or greater) or occlusion. Mild calcified plaque of the carotid bifurcation with minimal 20-30% stenosis. Moderate proximal tortuosity of ICA. Left carotid system: No evidence of dissection, stenosis (50% or greater) or occlusion. Moderate mixed plaque of the left carotid bifurcation without significant stenosis. Several small areas of plaque ulceration. Vertebral arteries: Codominant. No evidence of dissection, stenosis (50% or greater) or occlusion. Mixed plaque of left vertebral artery origin with mild approximately 50% stenosis. Moderate proximal tortuosity of ICA. Skeleton: Mild cervical spondylosis greatest at C5-6 level. No high-grade bony canal stenosis. Other neck: Left thyroid nodules measuring up to 7 mm. 8 x 7 x 10 mm enhancing nodule (AP x ML x CC series 5, image 120 and series 8, image 108) within the right anterior aspect of the larynx at level of false cords effacing right paraglottic fat, possibly within the right laryngeal ventricle. Right internal laryngocele. Upper chest: Negative. Review of the MIP images confirms the above findings CTA HEAD FINDINGS Anterior circulation: No significant stenosis, proximal occlusion, aneurysm, or vascular malformation. Atherosclerotic changes with calcifications in the carotid siphons multiple short segments of mild stenosis in the anterior middle cerebral artery distributions. Posterior circulation: No significant stenosis, proximal occlusion, aneurysm, or vascular malformation. Atherosclerotic changes with vertebral artery calcification in short segments of mild stenosis in the proximal posterior cerebral arteries. Venous sinuses: As permitted by contrast timing, patent. Anatomic variants: Patent anterior and left posterior communicating arteries. No right posterior  communicating artery identified, likely hypoplastic or absent. Delayed phase: No abnormal intracranial enhancement. Review of the MIP images confirms the above findings IMPRESSION: CT head: 1. Scattered hypoattenuating foci and left MCA territory corresponding to acute infarcts on MRI. 2. No new acute infarct or intracranial hemorrhage identified. No abnormal enhancement of the brain. 3. Mild chronic microvascular ischemic changes of the brain. CTA neck: 1. No large vessel occlusion, aneurysm, or high-grade stenosis of the carotid and vertebral arteries. 2. Mixed plaque of left vertebral artery with several small soft plaque ulcerations. 3. Enhancing nodule measuring up to 10 mm within the right anterior larynx at level of false cords effacing right paraglottic fat, possibly arising from the right laryngeal ventricle. Laryngeal carcinoma is possible, ENT consultation recommended. CTA  head: 1. No large vessel occlusion, aneurysm, or significant stenosis of the circle of Willis. 2. Intracranial atherosclerosis with areas of mild stenosis in the anterior and posterior circulation. These results will be called to the ordering clinician or representative by the Radiologist Assistant, and communication documented in the PACS or zVision Dashboard. Electronically Signed   By: Kristine Garbe M.D.   On: 01/09/2017 18:21   Mr Brain Wo Contrast  Result Date: 01/09/2017 CLINICAL DATA:  Aphasia beginning yesterday. EXAM: MRI HEAD WITHOUT CONTRAST TECHNIQUE: Multiplanar, multiecho pulse sequences of the brain and surrounding structures were obtained without intravenous contrast. COMPARISON:  CT same day FINDINGS: Brain: Diffusion imaging shows scattered cortical and subcortical infarctions in the left posterior frontal and parietal region consistent with embolic infarctions in the left MCA territory. None of these is larger than a cm. No other vascular territory insult. Brainstem and cerebellum are normal. Cerebral  hemispheres otherwise show chronic small-vessel ischemic changes of the deep and subcortical white matter. 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 No mass lesion, hemorrhage, hydrocephalus or extra-axial collection. Vascular: Major vessels at the base of the brain show flow. 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 No swelling or hemorrhage. Mild chronic small-vessel ischemic change of the white matter elsewhere. Electronically Signed   By: Nelson Chimes M.D.   On: 01/09/2017 14:50      No results found for: HGBA1C Lab Results  Component Value Date   LDLCALC UNABLE TO CALCULATE IF TRIGLYCERIDE OVER 400 mg/dL 01/10/2017   CREATININE 0.66 01/10/2017       Scheduled Meds: . amLODipine  10 mg Oral Daily   And  . benazepril  40 mg Oral Daily  . aspirin EC  325 mg Oral Daily  . atorvastatin  80 mg Oral q1800  . calcium-vitamin D  1 tablet Oral Daily  . enoxaparin (LOVENOX) injection  40 mg Subcutaneous Q24H  . fenofibrate  160 mg Oral Daily  . pantoprazole  40 mg Oral Daily   Continuous Infusions: . sodium chloride 75 mL/hr at 01/10/17 0712  . cefTRIAXone (ROCEPHIN)  IV Stopped (01/09/17 1842)     LOS: 0 days    Time spent: >30 MINS    Reyne Dumas  [facility] Pager [phone]. If 7PM-7AM, please contact night-coverage at [email or web address], password [email or web address] 01/10/2017, 10:26 AM  LOS: 0 days

## 2017-01-11 DIAGNOSIS — R471 Dysarthria and anarthria: Secondary | ICD-10-CM

## 2017-01-11 DIAGNOSIS — J382 Nodules of vocal cords: Secondary | ICD-10-CM | POA: Diagnosis present

## 2017-01-11 LAB — HEMOGLOBIN A1C
Hgb A1c MFr Bld: 7.3 % — ABNORMAL HIGH (ref 4.8–5.6)
Mean Plasma Glucose: 163 mg/dL

## 2017-01-11 MED ORDER — ATORVASTATIN CALCIUM 80 MG PO TABS: 80.0000 mg | ORAL_TABLET | Freq: Every day | ORAL | 0 refills | Status: DC

## 2017-01-11 MED ORDER — AMOXICILLIN-POT CLAVULANATE 875-125 MG PO TABS: 1.0000 | ORAL_TABLET | Freq: Two times a day (BID) | ORAL | 0 refills | Status: AC

## 2017-01-11 MED ORDER — DEXTROSE 5 % IV SOLN
1.0000 g | INTRAVENOUS | Status: DC
  Administered 2017-01-11: 1 g via INTRAVENOUS
  Filled 2017-01-11: qty 10

## 2017-01-11 MED ORDER — ACETAMINOPHEN 325 MG PO TABS: 650.0000 mg | ORAL_TABLET | ORAL | 1 refills | Status: DC | PRN

## 2017-01-11 MED ORDER — APIXABAN 5 MG PO TABS: 5.0000 mg | ORAL_TABLET | Freq: Two times a day (BID) | ORAL | 1 refills | Status: DC

## 2017-01-11 NOTE — Discharge Summary (Addendum)
Physician Discharge Summary  Caitlin Mccarthy DDU:[id number] DOB: 1936-05-07 DOA: 01/09/2017  PCP: [name], FNP  Admit date: 01/09/2017 Discharge date: 01/11/2017  Admitted From:home Disposition:  Home with outatient physical therapy  Recommendations for Outpatient Follow-up:  1. Follow up with PCP in 1-2 days 2. Please obtain BMP/CBC in one week 3. As patient lives outside of our area, I will refer her back to her physician Dr. [name]. We'll request that he refer the patient for outpatient physical therapy   Home Health:no Equipment/Devices:one  Discharge Condition:stable CODE STATUS:ull code Diet recommendation: Heart Healthy  Brief/Interim Summary: 81 y.o.femalewith a Past Medical History significant for hypertension, hyperlipidemia and recurrent UTIs who presents with dysarthria and aphasia. Patient found to have acute embolic infarct on MRI. Stroke workup mRI showed scatterd embolic infrction CT of the head and neck did not show any large vessel occlusion or aneurysm. Hemoglobin A1c was elevated at 7.3. Urinalysis was slightly abnormal book rule out multiple organisms suggestive of vaginal contamination. Given patient's recent hany urinary tract infection she'll complete a fivent. Today she'll receive 1 dose of ceftriaxone IV prior to dischas of Augmentin at discharge.  Echocardiogram showed: Study Conclusions  - Left ventricle: The cavity size was normal. There was mild   concentric hypertrophy. Systolic function was normal. The   estimated ejection fraction was in the range of 55% to 60%. Wall   motion was normal; there were no regional wall motion   abnormalities. Doppler parameters are consistent with abnormal   left ventricular relaxation (grade 1 diastolic dysfunction). - Aortic valve: Transvalvular velocity was within the normal range.   There was no stenosis. There was no regurgitation. - Mitral valve: Transvalvular velocity was within the normal  range.   There was no evidence for stenosis. There was trivial   regurgitation. Valve area by pressure half-time: 2.32 cm^2. - Right ventricle: The cavity size was normal. Wall thickness was   normal. Systolic function was normal. - Atrial septum: No defect or patent foramen ovale was identified   by color flow Doppler. - Tricuspid valve: There was mild regurgitation. - Pulmonary arteries: Systolic pressure was within the normal   range. PA peak pressure: 39 mm Hg (S).  CT scan of the head and neck shows: IMPRESSION: CT head:  1. Scattered hypoattenuating foci and left MCA territory corresponding to acute infarcts on MRI. 2. No new acute infarct or intracranial hemorrhage identified. No abnormal enhancement of the brain. 3. Mild chronic microvascular ischemic changes of the brain.  CTA neck:  1. No large vessel occlusion, aneurysm, or high-grade stenosis of the carotid and vertebral arteries. 2. Mixed plaque of left vertebral artery with several small soft plaque ulcerations. 3. Enhancing nodule measuring up to 10 mm within the right anterior larynx at level of false cords effacing right paraglottic fat, possibly arising from the right laryngeal ventricle. Laryngeal carcinoma is possible, ENT consultation recommended. CTA head:  1. No large vessel occlusion, aneurysm, or significant stenosis of the circle of Willis. 2. Intracranial atherosclerosis with areas of mild stenosis in the anterior and posterior circulation.  And the patient had CT of the neck carotid Dopplers were felt unnecessary. It is recommended that she follow up with ENT for evaluation of vocal cord nodule  Discharge Diagnoses:  Principal Problem:   Dysarthria Active Problems:   Hypertension   OA (osteoarthritis)   History of kidney stones   Hyperlipidemia   Abnormal urinalysis   Acute metabolic encephalopathy   Anxiety   Embolic  stroke [facility])   CVA (cerebral vascular accident) [facility])   Vocal  cord nodule    Discharge Instructions:  Follow-up with Dr. [name] in 1-2 days. Dr. [name] 20 range ENT evaluation for vocal cord nodule. Complete antibiotics as ped. Start Eliquis s directed Investigate cost of Eliquis as outpatient. If Eliquis is too expensive May switch to other anticoagulant per Dr. [name]. Patient given a 30 day supply coupon at discharge for Eliqius.  Discharge Instructions    Diet - low sodium heart healthy    Complete by:  As directed    Discharge instructions    Complete by:  As directed    Follow-up with Dr. [name] in 1-2 days.  Start in outpatient physical therapy program.  Complete Augmentin tablets for UTI for 2 more days.  Get referral to ENT physician from Dr. [name] to evaluate nodule on vocal cords.   Increase activity slowly    Complete by:  As directed      Allergies as of 01/11/2017   No Known Allergies     Medication List    STOP taking these medications   alendronate 70 MG tablet Commonly known as:  FOSAMAX   ALPRAZolam 0.25 MG tablet Commonly known as:  XANAX   cyclobenzaprine 5 MG tablet Commonly known as:  FLEXERIL   HYDROcodone-acetaminophen 5-325 MG tablet Commonly known as:  NORCO   hydrOXYzine 25 MG tablet Commonly known as:  ATARAX/VISTARIL   omeprazole 20 MG capsule Commonly known as:  PRILOSEC     TAKE these medications   acetaminophen 325 MG tablet Commonly known as:  TYLENOL Take 2 tablets (650 mg total) by mouth every 4 (four) hours as needed for mild pain (or temp > 37.5 C (99.5 F)).   amLODipine-benazepril 10-40 MG capsule Commonly known as:  LOTREL TAKE ONE CAPSULE EACH MORNING   amoxicillin-clavulanate 500-125 MG tablet Commonly known as:  AUGMENTIN Take 1 tablet (500 mg total) by mouth at bedtime. What changed:  Another medication with the same name was added. Make sure you understand how and when to take each.   amoxicillin-clavulanate 875-125 MG tablet Commonly known as:  AUGMENTIN Take 1 tablet  by mouth 2 (two) times daily. What changed:  You were already taking a medication with the same name, and this prescription was added. Make sure you understand how and when to take each.   apixaban 5 MG Tabs tablet Commonly known as:  ELIQUIS Take 1 tablet (5 mg total) by mouth 2 (two) times daily.   calcium-vitamin D 500-200 MG-UNIT tablet Commonly known as:  OSCAL WITH D Take 1 tablet by mouth daily.   fenofibrate 145 MG tablet Commonly known as:  TRICOR Take 1 tablet (145 mg total) by mouth daily.   lansoprazole 30 MG capsule Commonly known as:  PREVACID Take 30 mg by mouth daily as needed (indigestion).   PREMARIN vaginal cream Generic drug:  conjugated estrogens Apply 1 application topically daily as needed (irritation).   rosuvastatin 20 MG tablet Commonly known as:  CRESTOR TAKE ONE TABLET DAILY AT BEDTIME   traMADol 50 MG tablet Commonly known as:  ULTRAM Take 1-2 tablets (50-100 mg total) by mouth every 12 (twelve) hours as needed. What changed:  reasons to take this   triamcinolone cream 0.1 % Commonly known as:  KENALOG Apply 1 application topically daily as needed (itching).       No Known Allergies  Consultations:  neurology   Procedures/Studies: Ct Angio Head W Or Wo Contrast  Result Date: 01/09/2017 CLINICAL DATA:  81 y/o  F; embolic stroke. EXAM: CT ANGIOGRAPHY HEAD AND NECK TECHNIQUE: Multidetector CT imaging of the head and neck was performed using the standard protocol during bolus administration of intravenous contrast. Multiplanar CT image reconstructions and MIPs were obtained to evaluate the vascular anatomy. Carotid stenosis measurements (when applicable) are obtained utilizing NASCET criteria, using the distal internal carotid diameter as the denominator. CONTRAST:  50 cc Isovue 370 COMPARISON:  None. FINDINGS: CT HEAD FINDINGS Brain: Scattered hypoattenuating foci in the left MCA territory corresponding to infarctions on the prior MRI. No  evidence for new large territory infarct, intracranial hemorrhage, or significant mass effect. Mild chronic microvascular ischemic changes of white matter for age and parenchymal volume loss of the brain. Vascular: As below. Skull: Normal. Negative for fracture or focal lesion. Sinuses: Maxillary sinus fluid level. Orbits: Bilateral intra-ocular lens replacement. Review of the MIP images confirms the above findings CTA NECK FINDINGS Aortic arch: Standard branching. Imaged portion shows no evidence of aneurysm or dissection. No significant stenosis of the major arch vessel origins. Moderate mixed atherosclerotic plaque. Right carotid system: No evidence of dissection, stenosis (50% or greater) or occlusion. Mild calcified plaque of the carotid bifurcation with minimal 20-30% stenosis. Moderate proximal tortuosity of ICA. Left carotid system: No evidence of dissection, stenosis (50% or greater) or occlusion. Moderate mixed plaque of the left carotid bifurcation without significant stenosis. Several small areas of plaque ulceration. Vertebral arteries: Codominant. No evidence of dissection, stenosis (50% or greater) or occlusion. Mixed plaque of left vertebral artery origin with mild approximately 50% stenosis. Moderate proximal tortuosity of ICA. Skeleton: Mild cervical spondylosis greatest at C5-6 level. No high-grade bony canal stenosis. Other neck: Left thyroid nodules measuring up to 7 mm. 8 x 7 x 10 mm enhancing nodule (AP x ML x CC series 5, image 120 and series 8, image 108) within the right anterior aspect of the larynx at level of false cords effacing right paraglottic fat, possibly within the right laryngeal ventricle. Right internal laryngocele. Upper chest: Negative. Review of the MIP images confirms the above findings CTA HEAD FINDINGS Anterior circulation: No significant stenosis, proximal occlusion, aneurysm, or vascular malformation. Atherosclerotic changes with calcifications in the carotid siphons  multiple short segments of mild stenosis in the anterior middle cerebral artery distributions. Posterior circulation: No significant stenosis, proximal occlusion, aneurysm, or vascular malformation. Atherosclerotic changes with vertebral artery calcification in short segments of mild stenosis in the proximal posterior cerebral arteries. Venous sinuses: As permitted by contrast timing, patent. Anatomic variants: Patent anterior and left posterior communicating arteries. No right posterior communicating artery identified, likely hypoplastic or absent. Delayed phase: No abnormal intracranial enhancement. Review of the MIP images confirms the above findings IMPRESSION: CT head: 1. Scattered hypoattenuating foci and left MCA territory corresponding to acute infarcts on MRI. 2. No new acute infarct or intracranial hemorrhage identified. No abnormal enhancement of the brain. 3. Mild chronic microvascular ischemic changes of the brain. CTA neck: 1. No large vessel occlusion, aneurysm, or high-grade stenosis of the carotid and vertebral arteries. 2. Mixed plaque of left vertebral artery with several small soft plaque ulcerations. 3. Enhancing nodule measuring up to 10 mm within the right anterior larynx at level of false cords effacing right paraglottic fat, possibly arising from the right laryngeal ventricle. Laryngeal carcinoma is possible, ENT consultation recommended. CTA head: 1. No large vessel occlusion, aneurysm, or significant stenosis of the circle of Willis. 2. Intracranial atherosclerosis with areas of mild stenosis  in the anterior and posterior circulation. These results will be called to the ordering clinician or representative by the Radiologist Assistant, and communication documented in the PACS or zVision Dashboard. Electronically Signed   By: Kristine Garbe M.D.   On: 01/09/2017 18:21   Ct Head Wo Contrast  Result Date: 01/09/2017 CLINICAL DATA:  81 year old female with difficulty speaking  since yesterday evening. No headache or other complaints. EXAM: CT HEAD WITHOUT CONTRAST TECHNIQUE: Contiguous axial images were obtained from the base of the skull through the vertex without intravenous contrast. COMPARISON:  Head CT 04/13/2015. FINDINGS: Brain: Patchy and confluent areas of decreased attenuation are noted throughout the deep and periventricular white matter of the cerebral hemispheres bilaterally, compatible with chronic microvascular ischemic disease. No evidence of acute infarction, hemorrhage, hydrocephalus, extra-axial collection or mass lesion/mass effect. Vascular: No hyperdense vessel or unexpected calcification. Skull: Normal. Negative for fracture or focal lesion. Sinuses/Orbits: Small amount of mucosal thickening in the posterior right ethmoids. Small amount of dependent frothy secretions in the sphenoid sinus lying dependently. Trace left mastoid effusion. Other: None. IMPRESSION: 1. No acute intracranial abnormalities to account for the patient's symptoms. 2. Chronic microvascular ischemic changes in the cerebral white matter, as above. 3. Mild paranasal sinus disease and trace left mastoid effusion incidentally noted. Electronically Signed   By: Vinnie Langton M.D.   On: 01/09/2017 10:35   Ct Angio Neck W Or Wo Contrast  Result Date: 01/09/2017 CLINICAL DATA:  81 y/o  F; embolic stroke. EXAM: CT ANGIOGRAPHY HEAD AND NECK TECHNIQUE: Multidetector CT imaging of the head and neck was performed using the standard protocol during bolus administration of intravenous contrast. Multiplanar CT image reconstructions and MIPs were obtained to evaluate the vascular anatomy. Carotid stenosis measurements (when applicable) are obtained utilizing NASCET criteria, using the distal internal carotid diameter as the denominator. CONTRAST:  50 cc Isovue 370 COMPARISON:  None. FINDINGS: CT HEAD FINDINGS Brain: Scattered hypoattenuating foci in the left MCA territory corresponding to infarctions on  the prior MRI. No evidence for new large territory infarct, intracranial hemorrhage, or significant mass effect. Mild chronic microvascular ischemic changes of white matter for age and parenchymal volume loss of the brain. Vascular: As below. Skull: Normal. Negative for fracture or focal lesion. Sinuses: Maxillary sinus fluid level. Orbits: Bilateral intra-ocular lens replacement. Review of the MIP images confirms the above findings CTA NECK FINDINGS Aortic arch: Standard branching. Imaged portion shows no evidence of aneurysm or dissection. No significant stenosis of the major arch vessel origins. Moderate mixed atherosclerotic plaque. Right carotid system: No evidence of dissection, stenosis (50% or greater) or occlusion. Mild calcified plaque of the carotid bifurcation with minimal 20-30% stenosis. Moderate proximal tortuosity of ICA. Left carotid system: No evidence of dissection, stenosis (50% or greater) or occlusion. Moderate mixed plaque of the left carotid bifurcation without significant stenosis. Several small areas of plaque ulceration. Vertebral arteries: Codominant. No evidence of dissection, stenosis (50% or greater) or occlusion. Mixed plaque of left vertebral artery origin with mild approximately 50% stenosis. Moderate proximal tortuosity of ICA. Skeleton: Mild cervical spondylosis greatest at C5-6 level. No high-grade bony canal stenosis. Other neck: Left thyroid nodules measuring up to 7 mm. 8 x 7 x 10 mm enhancing nodule (AP x ML x CC series 5, image 120 and series 8, image 108) within the right anterior aspect of the larynx at level of false cords effacing right paraglottic fat, possibly within the right laryngeal ventricle. Right internal laryngocele. Upper chest: Negative. Review of  the MIP images confirms the above findings CTA HEAD FINDINGS Anterior circulation: No significant stenosis, proximal occlusion, aneurysm, or vascular malformation. Atherosclerotic changes with calcifications in the  carotid siphons multiple short segments of mild stenosis in the anterior middle cerebral artery distributions. Posterior circulation: No significant stenosis, proximal occlusion, aneurysm, or vascular malformation. Atherosclerotic changes with vertebral artery calcification in short segments of mild stenosis in the proximal posterior cerebral arteries. Venous sinuses: As permitted by contrast timing, patent. Anatomic variants: Patent anterior and left posterior communicating arteries. No right posterior communicating artery identified, likely hypoplastic or absent. Delayed phase: No abnormal intracranial enhancement. Review of the MIP images confirms the above findings IMPRESSION: CT head: 1. Scattered hypoattenuating foci and left MCA territory corresponding to acute infarcts on MRI. 2. No new acute infarct or intracranial hemorrhage identified. No abnormal enhancement of the brain. 3. Mild chronic microvascular ischemic changes of the brain. CTA neck: 1. No large vessel occlusion, aneurysm, or high-grade stenosis of the carotid and vertebral arteries. 2. Mixed plaque of left vertebral artery with several small soft plaque ulcerations. 3. Enhancing nodule measuring up to 10 mm within the right anterior larynx at level of false cords effacing right paraglottic fat, possibly arising from the right laryngeal ventricle. Laryngeal carcinoma is possible, ENT consultation recommended. CTA head: 1. No large vessel occlusion, aneurysm, or significant stenosis of the circle of Willis. 2. Intracranial atherosclerosis with areas of mild stenosis in the anterior and posterior circulation. These results will be called to the ordering clinician or representative by the Radiologist Assistant, and communication documented in the PACS or zVision Dashboard. Electronically Signed   By: Kristine Garbe M.D.   On: 01/09/2017 18:21   Mr Brain Wo Contrast  Result Date: 01/09/2017 CLINICAL DATA:  Aphasia beginning yesterday.  EXAM: MRI HEAD WITHOUT CONTRAST TECHNIQUE: Multiplanar, multiecho pulse sequences of the brain and surrounding structures were obtained without intravenous contrast. COMPARISON:  CT same day FINDINGS: Brain: Diffusion imaging shows scattered cortical and subcortical infarctions in the left posterior frontal and parietal region consistent with embolic infarctions in the left MCA territory. None of these is larger than a cm. No other vascular territory insult. Brainstem and cerebellum are normal. Cerebral hemispheres otherwise show chronic small-vessel ischemic changes of the deep and subcortical white matter. 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 No mass lesion, hemorrhage, hydrocephalus or extra-axial collection. Vascular: Major vessels at the base of the brain show flow. 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 No swelling or hemorrhage. Mild chronic small-vessel ischemic change of the white matter elsewhere. Electronically Signed   By: Nelson Chimes M.D.   On: 01/09/2017 14:50   (Echo, Carotid, EGD, Colonoscopy, ERCP)    Subjective:   Discharge Exam: Vitals:   01/11/17 0525 01/11/17 0927  BP: (!) 160/83 (!) 153/53  Pulse: 68 74  Resp: 18 18  Temp: 97.8 F (36.6 C) 98 F (36.7 C)   Vitals:   01/10/17 2146 01/11/17 0129 01/11/17 0525 01/11/17 0927  BP: (!) 155/67 139/69 (!) 160/83 (!) 153/53  Pulse: 70 66 68 74  Resp: 18 18 18 18   Temp: 98.6 F (37 C) 98.8 F (37.1 C) 97.8 F (36.6 C) 98 F (36.7 C)  TempSrc: Oral Oral Oral Oral  SpO2: 96% 95% 97% 96%  Weight:      Height:        General:  Pt is alert, awake, not in acute distress Cardiovascular: RRR, S1/S2 +, no rubs, no gallops Respiratory: CTA bilaterally, no wheezing, no rhonchi Abdominal: Soft, NT, ND, bowel sounds + Extremities: no  edema, no cyanosis    The results of significant diagnostics from this hospitalization (including imaging, microbiology, ancillary and laboratory) are listed below for reference.     Microbiology: Recent Results (from the past 240 hour(s))  Culture, Urine     Status: Abnormal   Collection Time: 01/09/17 12:17 PM  Result Value Ref Range Status   Specimen Description URINE, CLEAN CATCH  Final   Special Requests NONE  Final   Culture MULTIPLE ORGANISMS PRESENT, NONE PREDOMINANT (A)  Final   Report Status 01/10/2017 FINAL  Final     Labs: BNP (last 3 results) No results for input(s): BNP in the last 8760 hours. Basic Metabolic Panel:  Recent Labs Lab 01/09/17 0928 01/09/17 0959 01/10/17 0405  NA 137 139 139  K 4.3 4.2 3.8  CL 105 104 108  CO2 24  --  25  GLUCOSE 149* 151* 152*  BUN 8 11 9   CREATININE 0.66 0.60 0.66  CALCIUM 10.4*  --  10.1   Liver Function Tests:  Recent Labs Lab 01/09/17 0928  AST 46*  ALT 33  ALKPHOS 65  BILITOT 0.6  PROT 7.8  ALBUMIN 3.9   No results for input(s): LIPASE, AMYLASE in the last 168 hours. No results for input(s): AMMONIA in the last 168 hours. CBC:  Recent Labs Lab 01/09/17 0928 01/09/17 0959  WBC 4.9  --   NEUTROABS 2.4  --   HGB 13.0 14.3  HCT 40.5 42.0  MCV 92.5  --   PLT 299  --    Cardiac Enzymes: No results for input(s): CKTOTAL, CKMB, CKMBINDEX, TROPONINI in the last 168 hours. BNP: Invalid input(s): POCBNP CBG: No results for input(s): GLUCAP in the last 168 hours. D-Dimer No results for input(s): DDIMER in the last 72 hours. Hgb A1c  Recent Labs  01/10/17 0405  HGBA1C 7.3*   Lipid Profile  Recent Labs  01/10/17 0405  CHOL 222*  HDL 31*  LDLCALC UNABLE TO CALCULATE IF TRIGLYCERIDE OVER 400 mg/dL  TRIG 490*  CHOLHDL 7.2   Thyroid function studies No results for input(s): TSH, T4TOTAL, T3FREE, THYROIDAB in the last 72 hours.  Invalid input(s): FREET3 Anemia work up No results for  input(s): VITAMINB12, FOLATE, FERRITIN, TIBC, IRON, RETICCTPCT in the last 72 hours. Urinalysis    Component Value Date/Time   COLORURINE YELLOW 01/09/2017 1217   APPEARANCEUR HAZY (A) 01/09/2017 1217   APPEARANCEUR Clear 11/07/2016 0955   LABSPEC 1.012 01/09/2017 1217   PHURINE 6.0 01/09/2017 1217   GLUCOSEU NEGATIVE 01/09/2017 1217   HGBUR SMALL (A) 01/09/2017 1217   BILIRUBINUR NEGATIVE 01/09/2017 1217   BILIRUBINUR Negative 11/07/2016 0955   KETONESUR NEGATIVE 01/09/2017 1217   PROTEINUR 100 (A) 01/09/2017 1217   UROBILINOGEN negative 08/17/2015 1409   UROBILINOGEN 0.2 04/13/2015 1451   NITRITE NEGATIVE 01/09/2017 1217   LEUKOCYTESUR SMALL (A) 01/09/2017 1217   LEUKOCYTESUR 2+ (A) 11/07/2016 0955   Sepsis Labs Invalid input(s): PROCALCITONIN,  WBC,  LACTICIDVEN Microbiology Recent Results (from the past 240 hour(s))  Culture, Urine     Status: Abnormal   Collection Time: 01/09/17 12:17 PM  Result Value Ref Range Status   Specimen Description URINE, CLEAN CATCH  Final   Special Requests NONE  Final   Culture MULTIPLE ORGANISMS PRESENT, NONE PREDOMINANT (A)  Final  Report Status 01/10/2017 FINAL  Final     Time coordinating discharge: Over 30 minutes  SIGNED:   Lady Deutscher, MD, FACP [facility] 01/11/2017, 11:19 AM Pager   If 7PM-7AM, please contact night-coverage [email or web address] Password [email or web address]

## 2017-01-11 NOTE — Progress Notes (Signed)
Pt had some pink tinge urine during shift but the other urine were clear when checked. Pt denies any discomfort or pain but she expressed concern about her urine color and taking Eliquis. Will continue to monitor pt closely. Delia Heady RN

## 2017-01-11 NOTE — Care Management Note (Signed)
Case Management Note  Patient Details  Name: SELENA SWAMINATHAN MRN: [id number] Date of Birth: 1936-01-15  Subjective/Objective:                 Patient with order to DC to home today. Chart reviewed. No Home Health or Equipment needs, no unacknowledged Case Management consults or medication needs identified at the time of this note. Plan for DC to home. If needs arise today prior to discharge, please call Carles Collet RN CM at [phone].    Action/Plan:   Expected Discharge Date:  01/11/17               Expected Discharge Plan:  Home/Self Care  In-House Referral:     Discharge planning Services  CM Consult  Post Acute Care Choice:    Choice offered to:     DME Arranged:    DME Agency:     HH Arranged:    HH Agency:     Status of Service:  Completed, signed off  If discussed at [employer] of Stay Meetings, dates discussed:    Additional Comments:  Carles Collet, RN 01/11/2017, 10:54 AM

## 2017-01-11 NOTE — Progress Notes (Signed)
Patient ready for discharge to home; discharge instructions given and reviewed; Rx's sent electronically; daughter at bedside to review discharge instructions; patient dressed and ready; discharged out via wheelchair.

## 2017-01-13 ENCOUNTER — Telehealth: Payer: Self-pay | Admitting: *Deleted

## 2017-01-13 NOTE — Telephone Encounter (Addendum)
Call Completed and Appointment Scheduled: Yes, Date: 01/16/17 with Evelina Dun, FNP   DISCHARGE INFORMATION Date of Discharge:01/11/17   Discharge Facility: [facility]  Principal Discharge Diagnosis: CVA  Patient and/or caregiver is knowledgeable of his/her condition(s) and treatment: Yes  MEDICATION RECONCILIATION Current medication list reviewed with patient:Yes  Outpatient Encounter Prescriptions as of 01/13/2017  Medication Sig  . acetaminophen (TYLENOL) 325 MG tablet Take 2 tablets (650 mg total) by mouth every 4 (four) hours as needed for mild pain (or temp > 37.5 C (99.5 F)).  [name] amLODipine-benazepril (LOTREL) 10-40 MG capsule TAKE ONE CAPSULE EACH MORNING  . amoxicillin-clavulanate (AUGMENTIN) 500-125 MG tablet Take 1 tablet (500 mg total) by mouth at bedtime.  [name] amoxicillin-clavulanate (AUGMENTIN) 875-125 MG tablet Take 1 tablet by mouth 2 (two) times daily.  [name] apixaban (ELIQUIS) 5 MG TABS tablet Take 1 tablet (5 mg total) by mouth 2 (two) times daily.  . calcium-vitamin D (OSCAL WITH D) 500-200 MG-UNIT tablet Take 1 tablet by mouth daily.  . fenofibrate (TRICOR) 145 MG tablet Take 1 tablet (145 mg total) by mouth daily.  . lansoprazole (PREVACID) 30 MG capsule Take 30 mg by mouth daily as needed (indigestion).   [name] PREMARIN vaginal cream Apply 1 application topically daily as needed (irritation).   . rosuvastatin (CRESTOR) 20 MG tablet TAKE ONE TABLET DAILY AT BEDTIME  . traMADol (ULTRAM) 50 MG tablet Take 1-2 tablets (50-100 mg total) by mouth every 12 (twelve) hours as needed. (Patient taking differently: Take 50-100 mg by mouth every 12 (twelve) hours as needed for moderate pain. )  . triamcinolone cream (KENALOG) 0.1 % Apply 1 application topically daily as needed (itching).    No facility-administered encounter medications on file as of 01/13/2017.     Discharge Medications reviewed and reconciled with current medications.yes  Patient is able to obtain needed  medications:Yes  ACTIVITIES OF DAILY LIVING  Is the patient able to perform his/her own ADLs: Yes.    Patient is receiving home health services: No.  PATIENT EDUCATION Questions/Concerns Discussed: Patient is still taking Augmentin and will finish it today. No concerns or questions at this time. She will f/u sooner than scheduled appt if necessary. Needs BMP and CBC at visit.

## 2017-01-16 ENCOUNTER — Encounter: Payer: Self-pay | Admitting: Family

## 2017-01-16 ENCOUNTER — Ambulatory Visit (INDEPENDENT_AMBULATORY_CARE_PROVIDER_SITE_OTHER): Payer: Medicare HMO | Admitting: Family

## 2017-01-16 VITALS — BP 156/77 | HR 64 | Temp 97.3°F | Ht 71.0 in | Wt 210.6 lb

## 2017-01-16 DIAGNOSIS — Z09 Encounter for follow-up examination after completed treatment for conditions other than malignant neoplasm: Secondary | ICD-10-CM | POA: Diagnosis not present

## 2017-01-16 DIAGNOSIS — M150 Primary generalized (osteo)arthritis: Secondary | ICD-10-CM | POA: Diagnosis not present

## 2017-01-16 DIAGNOSIS — I6930 Unspecified sequelae of cerebral infarction: Secondary | ICD-10-CM

## 2017-01-16 DIAGNOSIS — M159 Polyosteoarthritis, unspecified: Secondary | ICD-10-CM

## 2017-01-16 NOTE — Patient Instructions (Signed)
Stroke Prevention Some medical conditions and behaviors are associated with an increased chance of having a stroke. You may prevent a stroke by making healthy choices and managing medical conditions. How can I reduce my risk of having a stroke?  Stay physically active. Get at least 30 minutes of activity on most or all days.  Do not smoke. It may also be helpful to avoid exposure to secondhand smoke.  Limit alcohol use. Moderate alcohol use is considered to be:  No more than 2 drinks per day for men.  No more than 1 drink per day for nonpregnant women.  Eat healthy foods. This involves:  Eating 5 or more servings of fruits and vegetables a day.  Making dietary changes that address high blood pressure (hypertension), high cholesterol, diabetes, or obesity.  Manage your cholesterol levels.  Making food choices that are high in fiber and low in saturated fat, trans fat, and cholesterol may control cholesterol levels.  Take any prescribed medicines to control cholesterol as directed by your health care provider.  Manage your diabetes.  Controlling your carbohydrate and sugar intake is recommended to manage diabetes.  Take any prescribed medicines to control diabetes as directed by your health care provider.  Control your hypertension.  Making food choices that are low in salt (sodium), saturated fat, trans fat, and cholesterol is recommended to manage hypertension.  Ask your health care provider if you need treatment to lower your blood pressure. Take any prescribed medicines to control hypertension as directed by your health care provider.  If you are 18-39 years of age, have your blood pressure checked every 3-5 years. If you are 40 years of age or older, have your blood pressure checked every year.  Maintain a healthy weight.  Reducing calorie intake and making food choices that are low in sodium, saturated fat, trans fat, and cholesterol are recommended to manage  weight.  Stop drug abuse.  Avoid taking birth control pills.  Talk to your health care provider about the risks of taking birth control pills if you are over 35 years old, smoke, get migraines, or have ever had a blood clot.  Get evaluated for sleep disorders (sleep apnea).  Talk to your health care provider about getting a sleep evaluation if you snore a lot or have excessive sleepiness.  Take medicines only as directed by your health care provider.  For some people, aspirin or blood thinners (anticoagulants) are helpful in reducing the risk of forming abnormal blood clots that can lead to stroke. If you have the irregular heart rhythm of atrial fibrillation, you should be on a blood thinner unless there is a good reason you cannot take them.  Understand all your medicine instructions.  Make sure that other conditions (such as anemia or atherosclerosis) are addressed. Get help right away if:  You have sudden weakness or numbness of the face, arm, or leg, especially on one side of the body.  Your face or eyelid droops to one side.  You have sudden confusion.  You have trouble speaking (aphasia) or understanding.  You have sudden trouble seeing in one or both eyes.  You have sudden trouble walking.  You have dizziness.  You have a loss of balance or coordination.  You have a sudden, severe headache with no known cause.  You have new chest pain or an irregular heartbeat. Any of these symptoms may represent a serious problem that is an emergency. Do not wait to see if the symptoms will go away.   Get medical help at once. Call your local emergency services (911 in [place] Do not drive yourself to the hospital. This information is not intended to replace advice given to you by your health care provider. Make sure you discuss any questions you have with your health care provider. Document Released: 07/25/2004 Document Revised: 11/23/2015 Document Reviewed: 12/18/2012 Elsevier  Interactive Patient Education  2017 Elsevier Inc.  

## 2017-01-16 NOTE — Progress Notes (Signed)
Subjective:    Patient ID: Caitlin Mccarthy, female    DOB: 10/19/1935, 80 y.o.   MRN: [id number]  HPI PT presents to the office today for hospital follow up. PT went to the ED with aphasia on 01/09/17 and was diagnosed with CVA. PT was discharged on 01/11/17. PT was contacted by RN on 01/13/17 by telephone.   PT was started on Eliquis. MRI showed Acute Embolic  Infarct. She was also discharged on Augmentin for UTI. PT denies any UTI symptoms at this time.   PT denies any weakness, aphasia, or new unsteady gait.  PT does have arthritis and would like Physical Therapy.   Review of Systems  Musculoskeletal: Positive for arthralgias.  All other systems reviewed and are negative.      Objective:   Physical Exam  Constitutional: She is oriented to person, place, and time. She appears well-developed and well-nourished. No distress.  HENT:  Head: Normocephalic and atraumatic.  Right Ear: External ear normal.  Left Ear: External ear normal.  Nose: Nose normal.  Mouth/Throat: Oropharynx is clear and moist.  Eyes: Pupils are equal, round, and reactive to light.  Neck: Normal range of motion. Neck supple. No thyromegaly present.  Cardiovascular: Normal rate, regular rhythm, normal heart sounds and intact distal pulses.   No murmur heard. Pulmonary/Chest: Effort normal and breath sounds normal. No respiratory distress. She has no wheezes.  Abdominal: Soft. Bowel sounds are normal. She exhibits no distension. There is no tenderness.  Musculoskeletal: Normal range of motion. She exhibits no edema or tenderness.  Neurological: She is alert and oriented to person, place, and time. She has normal reflexes. No cranial nerve deficit.  Skin: Skin is warm and dry.  Psychiatric: She has a normal mood and affect. Her behavior is normal. Judgment and thought content normal.  Vitals reviewed.   BP (!) 156/77   Pulse 64   Temp (!) 97.3 F (36.3 C) (Oral)   Ht 5' 11" (1.803 m)   Wt 210 lb 9.6  oz (95.5 kg)   BMI 29.37 kg/m       Assessment & Plan:  1. Hospital discharge follow-up - CMP14+EGFR - CBC with Differential/Platelet  2. Late effect of cerebrovascular accident (CVA) - CMP14+EGFR - CBC with Differential/Platelet - Ambulatory referral to Physical Therapy  3. Primary osteoarthritis involving multiple joints - CMP14+EGFR - CBC with Differential/Platelet - Ambulatory referral to Physical Therapy  Labs pending Continue Elquis  Referral to Physical therapy  RTO in 3 months     , FNP  

## 2017-01-17 LAB — CBC WITH DIFFERENTIAL/PLATELET
BASOS ABS: 0 10*3/uL (ref 0.0–0.2)
Basos: 0 %
EOS (ABSOLUTE): 0.1 10*3/uL (ref 0.0–0.4)
Eos: 1 %
HEMOGLOBIN: 12.8 g/dL (ref 11.1–15.9)
Hematocrit: 38.4 % (ref 34.0–46.6)
IMMATURE GRANS (ABS): 0 10*3/uL (ref 0.0–0.1)
IMMATURE GRANULOCYTES: 0 %
LYMPHS: 50 %
Lymphocytes Absolute: 2.8 10*3/uL (ref 0.7–3.1)
MCH: 30.4 pg (ref 26.6–33.0)
MCHC: 33.3 g/dL (ref 31.5–35.7)
MCV: 91 fL (ref 79–97)
MONOCYTES: 10 %
Monocytes Absolute: 0.6 10*3/uL (ref 0.1–0.9)
NEUTROS ABS: 2.2 10*3/uL (ref 1.4–7.0)
NEUTROS PCT: 39 %
PLATELETS: 250 10*3/uL (ref 150–379)
RBC: 4.21 x10E6/uL (ref 3.77–5.28)
RDW: 14.5 % (ref 12.3–15.4)
WBC: 5.7 10*3/uL (ref 3.4–10.8)

## 2017-01-17 LAB — CMP14+EGFR
ALT: 32 IU/L (ref 0–32)
AST: 41 IU/L — AB (ref 0–40)
Albumin/Globulin Ratio: 1.4 (ref 1.2–2.2)
Albumin: 4.4 g/dL (ref 3.5–4.8)
Alkaline Phosphatase: 64 IU/L (ref 39–117)
BILIRUBIN TOTAL: 0.4 mg/dL (ref 0.0–1.2)
BUN/Creatinine Ratio: 22 (ref 12–28)
BUN: 15 mg/dL (ref 8–27)
CALCIUM: 11.3 mg/dL — AB (ref 8.7–10.3)
CHLORIDE: 100 mmol/L (ref 96–106)
CO2: 24 mmol/L (ref 20–29)
Creatinine, Ser: 0.69 mg/dL (ref 0.57–1.00)
GFR calc non Af Amer: 84 mL/min/{1.73_m2} (ref >59)
GFR, EST AFRICAN AMERICAN: 96 mL/min/{1.73_m2} (ref >59)
GLUCOSE: 90 mg/dL (ref 65–99)
Globulin, Total: 3.1 g/dL (ref 1.5–4.5)
POTASSIUM: 4.1 mmol/L (ref 3.5–5.2)
Sodium: 138 mmol/L (ref 134–144)
TOTAL PROTEIN: 7.5 g/dL (ref 6.0–8.5)

## 2017-01-22 DIAGNOSIS — M6281 Muscle weakness (generalized): Secondary | ICD-10-CM | POA: Diagnosis not present

## 2017-01-22 DIAGNOSIS — R262 Difficulty in walking, not elsewhere classified: Secondary | ICD-10-CM | POA: Diagnosis not present

## 2017-01-24 DIAGNOSIS — R262 Difficulty in walking, not elsewhere classified: Secondary | ICD-10-CM | POA: Diagnosis not present

## 2017-01-24 DIAGNOSIS — M6281 Muscle weakness (generalized): Secondary | ICD-10-CM | POA: Diagnosis not present

## 2017-01-27 DIAGNOSIS — M6281 Muscle weakness (generalized): Secondary | ICD-10-CM | POA: Diagnosis not present

## 2017-01-27 DIAGNOSIS — R262 Difficulty in walking, not elsewhere classified: Secondary | ICD-10-CM | POA: Diagnosis not present

## 2017-01-29 DIAGNOSIS — M6281 Muscle weakness (generalized): Secondary | ICD-10-CM | POA: Diagnosis not present

## 2017-01-29 DIAGNOSIS — R262 Difficulty in walking, not elsewhere classified: Secondary | ICD-10-CM | POA: Diagnosis not present

## 2017-02-03 ENCOUNTER — Ambulatory Visit (INDEPENDENT_AMBULATORY_CARE_PROVIDER_SITE_OTHER): Payer: Commercial Managed Care - HMO | Admitting: Cardiovascular Disease

## 2017-02-03 ENCOUNTER — Encounter: Payer: Self-pay | Admitting: Cardiovascular Disease

## 2017-02-03 VITALS — BP 161/67 | HR 70 | Ht 70.0 in | Wt 210.0 lb

## 2017-02-03 DIAGNOSIS — E785 Hyperlipidemia, unspecified: Secondary | ICD-10-CM | POA: Diagnosis not present

## 2017-02-03 DIAGNOSIS — I10 Essential (primary) hypertension: Secondary | ICD-10-CM

## 2017-02-03 DIAGNOSIS — I499 Cardiac arrhythmia, unspecified: Secondary | ICD-10-CM | POA: Diagnosis not present

## 2017-02-03 DIAGNOSIS — I63419 Cerebral infarction due to embolism of unspecified middle cerebral artery: Secondary | ICD-10-CM

## 2017-02-03 NOTE — Progress Notes (Signed)
CARDIOLOGY CONSULT NOTE  Patient ID: SWAY GUTTIERREZ MRN: [id number] DOB/AGE: 79-Dec-1937 79 y.o.  Admit date: (Not on file) Primary Physician: [name], [place] Referring Physician: Hassell Done   Reason for Consultation: atrial fibrillation  HPI: Caitlin Mccarthy is a 81 y.o. female who is being seen today for the evaluation of Atrial fibrillation at the request of Kluth, Mary-Margaret, *.   She has a history of hypertension, diabetes, and hyperlipidemia. She also has a history of tobacco abuse.  She was recently hospitalized for transient expressive aphasia secondary to embolic left MCA branch infarct and was found to be in atrial fibrillation. This was a new diagnosis for her. She was started on Eliquis for anticoagulation.  Echocardiogram 01/10/17: Normal left ventricular systolic function and regional wall motion, LVEF 55-60%, mild LVH, grade 1 diastolic dysfunction, mild tricuspid regurgitation, and normal left atrial size.  ECGs performed on 12/24/16 and 01/09/17 on demonstrated sinus Caitlin with sinus arrhythmia and PACs.  Upon review of the hospitalist note by Dr. Allyson Sabal on 01/10/17, telemetry demonstrated sinus Caitlin with frequent PACs.  Neurology notes by Dr. Leonie Man on 01/10/17 mention sinus Caitlin with PACs and atrial fibrillation.  I personally interpreted telemetry strips on 01/09/17 which demonstrated sinus Caitlin with PACs.  She denies a history of chest pain and shortness of breath. She has no prior history of heart disease. She occasionally has palpitations lasting seconds. She had been taking aspirin prior to her stroke but then stopped it for lithotripsy.  She is here with her daughter.  No Known Allergies  Current Outpatient Prescriptions  Medication Sig Dispense Refill  . acetaminophen (TYLENOL) 325 MG tablet Take 2 tablets (650 mg total) by mouth every 4 (four) hours as needed for mild pain (or temp > 37.5 C (99.5 F)). 30 tablet 1  .  amLODipine-benazepril (LOTREL) 10-40 MG capsule TAKE ONE CAPSULE EACH MORNING 90 capsule 1  . amoxicillin-clavulanate (AUGMENTIN) 500-125 MG tablet Take 1 tablet (500 mg total) by mouth at bedtime. 30 tablet 2  . apixaban (ELIQUIS) 5 MG TABS tablet Take 1 tablet (5 mg total) by mouth 2 (two) times daily. 60 tablet 1  . calcium-vitamin D (OSCAL WITH D) 500-200 MG-UNIT tablet Take 1 tablet by mouth daily.    . fenofibrate (TRICOR) 145 MG tablet Take 1 tablet (145 mg total) by mouth daily. 30 tablet 5  . lansoprazole (PREVACID) 30 MG capsule Take 30 mg by mouth daily as needed (indigestion).     [name] PREMARIN vaginal cream Apply 1 application topically daily as needed (irritation).     . rosuvastatin (CRESTOR) 20 MG tablet TAKE ONE TABLET DAILY AT BEDTIME 90 tablet 1  . traMADol (ULTRAM) 50 MG tablet Take 1-2 tablets (50-100 mg total) by mouth every 12 (twelve) hours as needed. (Patient taking differently: Take 50-100 mg by mouth every 12 (twelve) hours as needed for moderate pain. ) 90 tablet 2  . triamcinolone cream (KENALOG) 0.1 % Apply 1 application topically daily as needed (itching).      No current facility-administered medications for this visit.     Past Medical History:  Diagnosis Date  . Anxiety   . Depression   . GERD (gastroesophageal reflux disease)   . History of acute pyelonephritis    last episode 04-13-2015  w/ sepsis  . History of kidney stones   . History of recurrent UTIs    MULTIPLE  . Hyperlipidemia   . Hypertension   . OA (osteoarthritis)  knees, hips, hands  . Osteoporosis   . Poor memory    especially when has uti  . Renal calculus, left   . Type 2 diabetes mellitus ([place])   . Vitamin D deficiency 05/10/2016    Past Surgical History:  Procedure Laterality Date  . CATARACT EXTRACTION W/PHACO  06/06/2011   Procedure: CATARACT EXTRACTION PHACO AND INTRAOCULAR LENS PLACEMENT (IOC);  Surgeon: Tonny Branch;  Location: [facility];  Service: Ophthalmology;  Laterality:  Right;  CDE=12.77  . CATARACT EXTRACTION W/PHACO  06/27/2011   Procedure: CATARACT EXTRACTION PHACO AND INTRAOCULAR LENS PLACEMENT (IOC);  Surgeon: Tonny Branch;  Location: [facility];  Service: Ophthalmology;  Laterality: Left;  CDE:13.96  . CYSTOSCOPY WITH URETEROSCOPY AND STENT PLACEMENT Right 04/27/2015   Procedure: CYSTOSCOPY WITH RIGHT URETEROSCOPY, BASKET REMOVAL OF STONE, REMOVAL OF RIGHT NEPHROSTOMY TUBE;  Surgeon: Irine Seal, MD;  Location: [facility];  Service: Urology;  Laterality: Right;  . CYSTOSCOPY/URETEROSCOPY/HOLMIUM LASER/STENT PLACEMENT Left 12/24/2016   Procedure: LEFT URETEROSCOPY WITH HOLMIUM LASER AND STENT PLACEMENT;  Surgeon: Irine Seal, MD;  Location: [facility];  Service: Urology;  Laterality: Left;  . EXTRACORPOREAL SHOCK WAVE LITHOTRIPSY  left 04-01-2016;  1980's  . FRACTURE SURGERY    . HOLMIUM LASER APPLICATION Right 14/48/1856   Procedure: HOLMIUM LASER APPLICATION;  Surgeon: Irine Seal, MD;  Location: [facility];  Service: Urology;  Laterality: Right;  . KNEE ARTHROSCOPY Bilateral right 2005//  left ?  [name] TOTAL HIP ARTHROPLASTY  10/28/2011   Procedure: TOTAL HIP ARTHROPLASTY;  Surgeon: Gearlean Alf, MD;  Location: [facility];  Service: Orthopedics;  Laterality: Right;  . TOTAL KNEE ARTHROPLASTY Bilateral left 03-09-2007//  right 2006  . TRANSTHORACIC ECHOCARDIOGRAM  10/06/2006   normal echo,  ef 55-60%  . TUBAL LIGATION      Social History   Social History  . Marital status: Married    Spouse name: N/A  . Number of children: N/A  . Years of education: N/A   Occupational History  . Not on file.   Social History Main Topics  . Smoking status: Current Every Day Smoker    Packs/day: 0.25    Years: 42.00    Types: Cigarettes  . Smokeless tobacco: Never Used  . Alcohol use No  . Drug use: No  . Sexual activity: Not on file   Other Topics Concern  . Not on file   Social History Narrative  . No narrative on  file     No family history of premature CAD in 1st degree relatives.  Current Meds  Medication Sig  . acetaminophen (TYLENOL) 325 MG tablet Take 2 tablets (650 mg total) by mouth every 4 (four) hours as needed for mild pain (or temp > 37.5 C (99.5 F)).  [name] amLODipine-benazepril (LOTREL) 10-40 MG capsule TAKE ONE CAPSULE EACH MORNING  . amoxicillin-clavulanate (AUGMENTIN) 500-125 MG tablet Take 1 tablet (500 mg total) by mouth at bedtime.  [name] apixaban (ELIQUIS) 5 MG TABS tablet Take 1 tablet (5 mg total) by mouth 2 (two) times daily.  . calcium-vitamin D (OSCAL WITH D) 500-200 MG-UNIT tablet Take 1 tablet by mouth daily.  . fenofibrate (TRICOR) 145 MG tablet Take 1 tablet (145 mg total) by mouth daily.  . lansoprazole (PREVACID) 30 MG capsule Take 30 mg by mouth daily as needed (indigestion).   [name] PREMARIN vaginal cream Apply 1 application topically daily as needed (irritation).   . rosuvastatin (CRESTOR) 20 MG tablet TAKE ONE TABLET DAILY  AT BEDTIME  . traMADol (ULTRAM) 50 MG tablet Take 1-2 tablets (50-100 mg total) by mouth every 12 (twelve) hours as needed. (Patient taking differently: Take 50-100 mg by mouth every 12 (twelve) hours as needed for moderate pain. )  . triamcinolone cream (KENALOG) 0.1 % Apply 1 application topically daily as needed (itching).       Review of systems complete and found to be negative unless listed above in HPI    Physical exam Blood pressure (!) 179/73, pulse 70, height 5\' 10"  (1.778 m), weight 210 lb (95.3 kg), SpO2 96 %. General: NAD Neck: No JVD, no thyromegaly or thyroid nodule.  Lungs: Clear to auscultation bilaterally with normal respiratory effort. CV: Nondisplaced PMI. Regular rate and Caitlin, normal S1/S2, no S3/S4, no murmur.  No peripheral edema.  No carotid bruit.    Abdomen: Soft, nontender, no distention.  Skin: Intact without lesions or rashes.  Neurologic: Alert and oriented x 3.  Psych: Normal affect. Extremities: No clubbing or  cyanosis.  HEENT: Normal.   ECG: Most recent ECG reviewed.   Labs: Lab Results  Component Value Date/Time   K 4.1 01/16/2017 12:52 PM   K 3.4 (L) 08/15/2015 02:50 PM   BUN 15 01/16/2017 12:52 PM   BUN 18.8 08/15/2015 02:50 PM   CREATININE 0.69 01/16/2017 12:52 PM   CREATININE 0.9 08/15/2015 02:50 PM   ALT 32 01/16/2017 12:52 PM   ALT 27 08/15/2015 02:50 PM   HGB 12.8 01/16/2017 12:52 PM   HGB 14.5 08/15/2015 02:50 PM     Lipids: Lab Results  Component Value Date/Time   LDLCALC UNABLE TO CALCULATE IF TRIGLYCERIDE OVER 400 mg/dL 01/10/2017 04:05 AM   LDLCALC 173 (H) 10/10/2016 11:38 AM   LDLCALC 100 (H) 11/29/2013 12:23 PM   LDLCALC 92 10/02/2012 01:01 PM   CHOL 222 (H) 01/10/2017 04:05 AM   CHOL 278 (H) 10/10/2016 11:38 AM   CHOL 158 10/02/2012 01:01 PM   TRIG 490 (H) 01/10/2017 04:05 AM   TRIG 229 (H) 11/24/2014 11:26 AM   TRIG 126 10/02/2012 01:01 PM   HDL 31 (L) 01/10/2017 04:05 AM   HDL 40 10/10/2016 11:38 AM   HDL 42 11/24/2014 11:26 AM   HDL 41 10/02/2012 01:01 PM        ASSESSMENT AND PLAN:  1. Arrhythmia: Atrial fibrillation is suspected but I find no actual documentation of this. I will obtain a 30 day event monitor. She will remain on Eliquis until atrial fibrillation is ruled out. At that time I would recommend aspirin if there is no evidence of atrial fibrillation. If there is no atrial fibrillation detected, I would recommend an implantable loop recorder.  2. Hypertension: Markedly elevated. She is on 10 mg of amlodipine and 40 mg of benazepril. Renal function was normal on 01/10/17. I have asked the patient to check blood pressure readings 4 times per week, at different times throughout the day, in order to get a better approximation of mean BP values. These results will be provided to me at the end of that period so that I can determine if antihypertensive medication titration is indicated.  3. Left MCA CVA: At this point it is deemed secondary to atrial  fibrillation for which she is taking Eliquis. As stated above, I will obtain a 30 day event monitor to see if there is indeed atrial fibrillation. If there is no evidence for this, I would recommend switching Eliquis to aspirin.  4. Hypercholesterolemia: Lipids 01/10/17 demonstrated total cholesterol  222, triglycerides 490, HDL 31, LDL 173. She is on Crestor 20 g daily.  Disposition: Follow up in 6 weeks.   Signed: Kate Sable, M.D., F.A.C.C.  02/03/2017, 9:31 AM

## 2017-02-03 NOTE — Patient Instructions (Addendum)
Medication Instructions:  Continue all current medications.  Labwork: none  Testing/Procedures: Your physician has recommended that you wear a 30 day event monitor. Event monitors are medical devices that record the heart's electrical activity. Doctors most often us these monitors to diagnose arrhythmias. Arrhythmias are problems with the speed or rhythm of the heartbeat. The monitor is a small, portable device. You can wear one while you do your normal daily activities. This is usually used to diagnose what is causing palpitations/syncope (passing out).  Office will contact with results via phone or letter.    Follow-Up: 6 weeks    Any Other Special Instructions Will Be Listed Below (If Applicable).   If you need a refill on your cardiac medications before your next appointment, please call your pharmacy.  

## 2017-02-05 DIAGNOSIS — M6281 Muscle weakness (generalized): Secondary | ICD-10-CM | POA: Diagnosis not present

## 2017-02-05 DIAGNOSIS — R262 Difficulty in walking, not elsewhere classified: Secondary | ICD-10-CM | POA: Diagnosis not present

## 2017-02-07 DIAGNOSIS — R262 Difficulty in walking, not elsewhere classified: Secondary | ICD-10-CM | POA: Diagnosis not present

## 2017-02-07 DIAGNOSIS — M6281 Muscle weakness (generalized): Secondary | ICD-10-CM | POA: Diagnosis not present

## 2017-02-10 ENCOUNTER — Ambulatory Visit (INDEPENDENT_AMBULATORY_CARE_PROVIDER_SITE_OTHER): Payer: Medicare HMO

## 2017-02-10 DIAGNOSIS — I480 Paroxysmal atrial fibrillation: Secondary | ICD-10-CM | POA: Diagnosis not present

## 2017-02-10 DIAGNOSIS — I499 Cardiac arrhythmia, unspecified: Secondary | ICD-10-CM | POA: Diagnosis not present

## 2017-02-10 DIAGNOSIS — M6281 Muscle weakness (generalized): Secondary | ICD-10-CM | POA: Diagnosis not present

## 2017-02-10 DIAGNOSIS — R262 Difficulty in walking, not elsewhere classified: Secondary | ICD-10-CM | POA: Diagnosis not present

## 2017-02-12 ENCOUNTER — Other Ambulatory Visit: Payer: Self-pay | Admitting: Family

## 2017-02-12 DIAGNOSIS — R262 Difficulty in walking, not elsewhere classified: Secondary | ICD-10-CM | POA: Diagnosis not present

## 2017-02-12 DIAGNOSIS — M6281 Muscle weakness (generalized): Secondary | ICD-10-CM | POA: Diagnosis not present

## 2017-02-17 DIAGNOSIS — Z85828 Personal history of other malignant neoplasm of skin: Secondary | ICD-10-CM | POA: Diagnosis not present

## 2017-02-17 DIAGNOSIS — L570 Actinic keratosis: Secondary | ICD-10-CM | POA: Diagnosis not present

## 2017-02-17 DIAGNOSIS — C44729 Squamous cell carcinoma of skin of left lower limb, including hip: Secondary | ICD-10-CM | POA: Diagnosis not present

## 2017-02-17 DIAGNOSIS — D485 Neoplasm of uncertain behavior of skin: Secondary | ICD-10-CM | POA: Diagnosis not present

## 2017-02-19 DIAGNOSIS — R262 Difficulty in walking, not elsewhere classified: Secondary | ICD-10-CM | POA: Diagnosis not present

## 2017-02-19 DIAGNOSIS — M6281 Muscle weakness (generalized): Secondary | ICD-10-CM | POA: Diagnosis not present

## 2017-02-20 ENCOUNTER — Other Ambulatory Visit: Payer: Self-pay | Admitting: Family

## 2017-02-21 DIAGNOSIS — R262 Difficulty in walking, not elsewhere classified: Secondary | ICD-10-CM | POA: Diagnosis not present

## 2017-02-21 DIAGNOSIS — M6281 Muscle weakness (generalized): Secondary | ICD-10-CM | POA: Diagnosis not present

## 2017-02-26 DIAGNOSIS — R262 Difficulty in walking, not elsewhere classified: Secondary | ICD-10-CM | POA: Diagnosis not present

## 2017-02-26 DIAGNOSIS — M6281 Muscle weakness (generalized): Secondary | ICD-10-CM | POA: Diagnosis not present

## 2017-02-28 DIAGNOSIS — R262 Difficulty in walking, not elsewhere classified: Secondary | ICD-10-CM | POA: Diagnosis not present

## 2017-02-28 DIAGNOSIS — M6281 Muscle weakness (generalized): Secondary | ICD-10-CM | POA: Diagnosis not present

## 2017-03-05 DIAGNOSIS — R292 Abnormal reflex: Secondary | ICD-10-CM | POA: Diagnosis not present

## 2017-03-05 DIAGNOSIS — M6281 Muscle weakness (generalized): Secondary | ICD-10-CM | POA: Diagnosis not present

## 2017-03-07 ENCOUNTER — Encounter: Payer: Self-pay | Admitting: Cardiovascular Disease

## 2017-03-07 ENCOUNTER — Ambulatory Visit (INDEPENDENT_AMBULATORY_CARE_PROVIDER_SITE_OTHER): Payer: Medicare HMO | Admitting: Cardiovascular Disease

## 2017-03-07 VITALS — BP 152/80 | HR 79 | Ht 70.0 in | Wt 209.0 lb

## 2017-03-07 DIAGNOSIS — E785 Hyperlipidemia, unspecified: Secondary | ICD-10-CM | POA: Diagnosis not present

## 2017-03-07 DIAGNOSIS — I639 Cerebral infarction, unspecified: Secondary | ICD-10-CM | POA: Diagnosis not present

## 2017-03-07 DIAGNOSIS — I499 Cardiac arrhythmia, unspecified: Secondary | ICD-10-CM

## 2017-03-07 DIAGNOSIS — I4891 Unspecified atrial fibrillation: Secondary | ICD-10-CM

## 2017-03-07 DIAGNOSIS — I10 Essential (primary) hypertension: Secondary | ICD-10-CM | POA: Diagnosis not present

## 2017-03-07 MED ORDER — CHLORTHALIDONE 25 MG PO TABS: 25.0000 mg | ORAL_TABLET | Freq: Every day | ORAL | 6 refills | Status: DC

## 2017-03-07 NOTE — Progress Notes (Signed)
SUBJECTIVE: The patient returns for follow-up after undergoing cardiovascular testing performed for the evaluation of arrhythmia.  I have been able to review most of the monitoring strips which have demonstrated sinus rhythm with frequent PACs. There has been no evidence of each or fibrillation.  She is undergoing physical therapy 3 days a week and is regaining her strength.  She denies chest pain, palpitations, shortness of breath.  She is here with her daughter, Rojelio Brenner, who owns [employer] in [place].  The patient has another daughter and 3 grandchildren altogether.    Review of Systems: As per "subjective", otherwise negative.  No Known Allergies  Current Outpatient Prescriptions  Medication Sig Dispense Refill  . acetaminophen (TYLENOL) 325 MG tablet Take 2 tablets (650 mg total) by mouth every 4 (four) hours as needed for mild pain (or temp > 37.5 C (99.5 F)). 30 tablet 1  . amLODipine-benazepril (LOTREL) 10-40 MG capsule TAKE ONE CAPSULE EACH MORNING 90 capsule 1  . amoxicillin-clavulanate (AUGMENTIN) 500-125 MG tablet Take 1 tablet (500 mg total) by mouth at bedtime. 30 tablet 2  . calcium-vitamin D (OSCAL WITH D) 500-200 MG-UNIT tablet Take 1 tablet by mouth daily.    [name] ELIQUIS 5 MG TABS tablet TAKE ONE TABLET BY MOUTH TWICE DAILY 60 tablet 4  . fenofibrate (TRICOR) 145 MG tablet Take 1 tablet (145 mg total) by mouth daily. 30 tablet 5  . lansoprazole (PREVACID) 30 MG capsule Take 30 mg by mouth daily as needed (indigestion).     [name] PREMARIN vaginal cream Apply 1 application topically daily as needed (irritation).     . rosuvastatin (CRESTOR) 20 MG tablet TAKE ONE TABLET DAILY AT BEDTIME 90 tablet 1  . traMADol (ULTRAM) 50 MG tablet Take 1-2 tablets (50-100 mg total) by mouth every 12 (twelve) hours as needed. (Patient taking differently: Take 50-100 mg by mouth every 12 (twelve) hours as needed for moderate pain. ) 90 tablet 2  . triamcinolone cream (KENALOG)  0.1 % Apply 1 application topically daily as needed (itching).      No current facility-administered medications for this visit.     Past Medical History:  Diagnosis Date  . Anxiety   . Depression   . GERD (gastroesophageal reflux disease)   . History of acute pyelonephritis    last episode 04-13-2015  w/ sepsis  . History of kidney stones   . History of recurrent UTIs    MULTIPLE  . Hyperlipidemia   . Hypertension   . OA (osteoarthritis)    knees, hips, hands  . Osteoporosis   . Poor memory    especially when has uti  . Renal calculus, left   . Type 2 diabetes mellitus ([place])   . Vitamin D deficiency 05/10/2016    Past Surgical History:  Procedure Laterality Date  . CATARACT EXTRACTION W/PHACO  06/06/2011   Procedure: CATARACT EXTRACTION PHACO AND INTRAOCULAR LENS PLACEMENT (IOC);  Surgeon: Tonny Branch;  Location: [facility];  Service: Ophthalmology;  Laterality: Right;  CDE=12.77  . CATARACT EXTRACTION W/PHACO  06/27/2011   Procedure: CATARACT EXTRACTION PHACO AND INTRAOCULAR LENS PLACEMENT (IOC);  Surgeon: Tonny Branch;  Location: [facility];  Service: Ophthalmology;  Laterality: Left;  CDE:13.96  . CYSTOSCOPY WITH URETEROSCOPY AND STENT PLACEMENT Right 04/27/2015   Procedure: CYSTOSCOPY WITH RIGHT URETEROSCOPY, BASKET REMOVAL OF STONE, REMOVAL OF RIGHT NEPHROSTOMY TUBE;  Surgeon: Irine Seal, MD;  Location: [facility];  Service: Urology;  Laterality: Right;  .  CYSTOSCOPY/URETEROSCOPY/HOLMIUM LASER/STENT PLACEMENT Left 12/24/2016   Procedure: LEFT URETEROSCOPY WITH HOLMIUM LASER AND STENT PLACEMENT;  Surgeon: Irine Seal, MD;  Location: [facility];  Service: Urology;  Laterality: Left;  . EXTRACORPOREAL SHOCK WAVE LITHOTRIPSY  left 04-01-2016;  1980's  . FRACTURE SURGERY    . HOLMIUM LASER APPLICATION Right 35/00/9381   Procedure: HOLMIUM LASER APPLICATION;  Surgeon: Irine Seal, MD;  Location: [facility];  Service: Urology;  Laterality:  Right;  . KNEE ARTHROSCOPY Bilateral right 2005//  left ?  [name] TOTAL HIP ARTHROPLASTY  10/28/2011   Procedure: TOTAL HIP ARTHROPLASTY;  Surgeon: Gearlean Alf, MD;  Location: [facility];  Service: Orthopedics;  Laterality: Right;  . TOTAL KNEE ARTHROPLASTY Bilateral left 03-09-2007//  right 2006  . TRANSTHORACIC ECHOCARDIOGRAM  10/06/2006   normal echo,  ef 55-60%  . TUBAL LIGATION      Social History   Social History  . Marital status: Married    Spouse name: N/A  . Number of children: N/A  . Years of education: N/A   Occupational History  . Not on file.   Social History Main Topics  . Smoking status: Current Every Day Smoker    Packs/day: 0.25    Years: 42.00    Types: Cigarettes  . Smokeless tobacco: Never Used  . Alcohol use No  . Drug use: No  . Sexual activity: Not on file   Other Topics Concern  . Not on file   Social History Narrative  . No narrative on file     Vitals:   03/07/17 1056  BP: (!) 152/80  Pulse: 79  SpO2: 96%  Weight: 209 lb (94.8 kg)  Height: 5\' 10"  (1.778 m)    Wt Readings from Last 3 Encounters:  03/07/17 209 lb (94.8 kg)  02/03/17 210 lb (95.3 kg)  01/16/17 210 lb 9.6 oz (95.5 kg)     PHYSICAL EXAM General: NAD HEENT: Normal. Neck: No JVD, no thyromegaly. Lungs: Clear to auscultation bilaterally with normal respiratory effort. CV: Nondisplaced PMI.  Regular rate and rhythm, normal S1/S2, no S3/S4, no murmur. No pretibial or periankle edema.     Abdomen: Soft, nontender, no distention.  Neurologic: Alert and oriented.  Psych: Normal affect. Musculoskeletal: No gross deformities.    ECG: Most recent ECG reviewed.   Labs: Lab Results  Component Value Date/Time   K 4.1 01/16/2017 12:52 PM   K 3.4 (L) 08/15/2015 02:50 PM   BUN 15 01/16/2017 12:52 PM   BUN 18.8 08/15/2015 02:50 PM   CREATININE 0.69 01/16/2017 12:52 PM   CREATININE 0.9 08/15/2015 02:50 PM   ALT 32 01/16/2017 12:52 PM   ALT 27 08/15/2015 02:50 PM   HGB  12.8 01/16/2017 12:52 PM   HGB 14.5 08/15/2015 02:50 PM     Lipids: Lab Results  Component Value Date/Time   LDLCALC UNABLE TO CALCULATE IF TRIGLYCERIDE OVER 400 mg/dL 01/10/2017 04:05 AM   LDLCALC 173 (H) 10/10/2016 11:38 AM   LDLCALC 100 (H) 11/29/2013 12:23 PM   LDLCALC 92 10/02/2012 01:01 PM   CHOL 222 (H) 01/10/2017 04:05 AM   CHOL 278 (H) 10/10/2016 11:38 AM   CHOL 158 10/02/2012 01:01 PM   TRIG 490 (H) 01/10/2017 04:05 AM   TRIG 229 (H) 11/24/2014 11:26 AM   TRIG 126 10/02/2012 01:01 PM   HDL 31 (L) 01/10/2017 04:05 AM   HDL 40 10/10/2016 11:38 AM   HDL 42 11/24/2014 11:26 AM   HDL 41 10/02/2012 01:01  PM       ASSESSMENT AND PLAN:  1. Arrhythmia: Atrial fibrillation is suspected but I find no actual documentation of this. I have been able to review most of the monitoring strips which have demonstrated sinus rhythm with frequent PACs. There has been no evidence of each or fibrillation. She will remain on Eliquis until atrial fibrillation is ruled out. At that time I would recommend aspirin if there is no evidence of atrial fibrillation. As event monitoring did not detect atrial fibrillation, I have recommended an implantable loop recorder. I will make an EP referral.  2. Hypertension: Remains elevated. She is on 10 mg of amlodipine and 40 mg of benazepril. Renal function was normal on 01/10/17. I personally reviewed her blood pressure log. Blood pressures consistently elevated. I will start chlorthalidone 25 mg daily and check a basic embolic panel in one week.  3. Left MCA CVA: At this point it is deemed secondary to atrial fibrillation for which she is taking Eliquis.  I have been able to review most of the monitoring strips which have demonstrated sinus rhythm with frequent PACs. There has been no evidence of each or fibrillation. She will remain on Eliquis until atrial fibrillation is ruled out. At that time I would recommend aspirin if there is no evidence of atrial  fibrillation. As event monitoring did not detect atrial fibrillation, I have recommended an implantable loop recorder. I will make an EP referral.  4. Hypercholesterolemia: Lipids 01/10/17 demonstrated total cholesterol 222, triglycerides 490, HDL 31, LDL 173. She is on Crestor 20 mg daily.     Disposition: Follow up 3 months.   Kate Sable, M.D., F.A.C.C.

## 2017-03-07 NOTE — Patient Instructions (Addendum)
Medication Instructions:   Begin Chlorthalidone 25mg  daily.   Continue all other medications.    Labwork:  BMET - order given today.   Due in 1 week.  Office will contact with results via phone or letter.    Testing/Procedures: none  Follow-Up: 3 months   Any Other Special Instructions Will Be Listed Below (If Applicable). You have been referred to:  EP (electrophysiology) to be evaluated for implantable loop recorder.    If you need a refill on your cardiac medications before your next appointment, please call your pharmacy.

## 2017-03-07 NOTE — Addendum Note (Signed)
Addended by: Laurine Blazer on: 03/07/2017 11:40 AM   Modules accepted: Orders

## 2017-03-13 ENCOUNTER — Other Ambulatory Visit: Payer: Self-pay | Admitting: Nurse Practitioner

## 2017-03-13 DIAGNOSIS — I10 Essential (primary) hypertension: Secondary | ICD-10-CM

## 2017-03-17 ENCOUNTER — Ambulatory Visit (INDEPENDENT_AMBULATORY_CARE_PROVIDER_SITE_OTHER): Payer: Medicare HMO | Admitting: Nurse Practitioner

## 2017-03-17 ENCOUNTER — Encounter: Payer: Self-pay | Admitting: Nurse Practitioner

## 2017-03-17 VITALS — BP 148/78 | HR 84 | Ht 70.0 in | Wt 206.8 lb

## 2017-03-17 DIAGNOSIS — D485 Neoplasm of uncertain behavior of skin: Secondary | ICD-10-CM | POA: Diagnosis not present

## 2017-03-17 DIAGNOSIS — E785 Hyperlipidemia, unspecified: Secondary | ICD-10-CM

## 2017-03-17 DIAGNOSIS — I639 Cerebral infarction, unspecified: Secondary | ICD-10-CM | POA: Diagnosis not present

## 2017-03-17 DIAGNOSIS — L570 Actinic keratosis: Secondary | ICD-10-CM | POA: Diagnosis not present

## 2017-03-17 DIAGNOSIS — Z85828 Personal history of other malignant neoplasm of skin: Secondary | ICD-10-CM | POA: Diagnosis not present

## 2017-03-17 DIAGNOSIS — C44622 Squamous cell carcinoma of skin of right upper limb, including shoulder: Secondary | ICD-10-CM | POA: Diagnosis not present

## 2017-03-17 DIAGNOSIS — I10 Essential (primary) hypertension: Secondary | ICD-10-CM

## 2017-03-17 NOTE — Patient Instructions (Signed)
Stressed the importance of management of risk factors to prevent further stroke Continue Eliquis for secondary stroke prevention and suspected at [facility] strict control of hypertension with blood pressure goal below 130/90, today's [phone] continue antihypertensive medications Control of diabetes with hemoglobin A1c below 6.5 followed by primary care most recent hemoglobin A1c 7.3 Cholesterol with LDL cholesterol less than 70, followed by primary care,  continue statin drugs crestor Continue PT then HEP  eat healthy diet with whole grains,  fresh fruits and vegetables F/U in 6 months

## 2017-03-17 NOTE — Progress Notes (Signed)
[facility]  PATIENT: Caitlin Mccarthy DOB: 07/25/1935   REASON FOR VISIT: Hospital follow-up for left MCA CVA HISTORY FROM: Patient and daughter [place] (per record) [name] a 81 y.o.femalewith a history of atrial fibrillation, diabetes mellitus, hypercoagulable state, hyperlipidemia, hypertension and smoking who presented with aphasia and slurred speech starting 7/11.  She was unable "to get [her] words out", although she knew what she wanted to say and recalls being about to understand those around her. Ms. Heidler stated that these symptoms resolved sometime this morning. She denies any numbness or weakness in her extremities, dizziness, blurry or double vision.  Daughter at bedside stated that she was repeating 'uh' yesterday and not babbling. Further, she appeared confused in a manner that is typical of when she has a UTI.  Per her daughter, Ms. How is almost back to normal, just slightly more confused than baseline and occasionally slurring her words. Notably, she does not have a diagnosis of atrial fibrillation, nor is she on any anticoagulation, but was switching between NSR with PACs and afib on my visit. She denies chest pain and palpitations.  CT head 7/12: No acute abnormalities. Chronic small vessel disease.   MR brain pending Date last known well: Date: 01/08/2017 Time last known well: Unable to determine Modified Rankin Score: 1 Patient was not administered IV t-PA secondary to arriving outside of the treatment window. She was admitted to General Neurology for further evaluation and treatment. Interval history 09/17/2018CM Ms. Farinas, 81 year old female returns for follow-up stroke clinic after hospitalization. MRI of the brain scattered small embolic infarctions within the left MCA territory. Mild chronic small vessel ischemic change of the white matter elsewhere. CTA head and neck no  large vessel occlusion. 2-D echo EF 55-60% no source of embolus. LDL 173 Hemoglobin A1c 7.3. She is currently on eliquis for secondary stroke prevention & presumed atrial fibrillation. 30 day event monitoring without evidence of atrial fib just PACs. She is  to get a loop recorder. She denies further stroke or TIA symptoms. She has minimal bruising or bleeding. Blood pressure in the office today 148/78 she was recently placed on Chlorthalidone . She is currently receiving physical therapy and she claims is beneficial. She ambulates with single-point cane. She returns for reevaluation   REVIEW OF SYSTEMS: Full 14 system review of systems performed and notable only for those listed, all others are neg:  Constitutional: neg  Cardiovascular: neg Ear/Nose/Throat: neg  Skin: neg Eyes: neg Respiratory: neg Gastroitestinal: neg  Hematology/Lymphatic: neg  Endocrine: neg Musculoskeletal: Joint pain Allergy/Immunology: neg Neurological: neg Psychiatric: neg Sleep : neg   ALLERGIES: No Known Allergies  HOME MEDICATIONS: Outpatient Medications Prior to Visit  Medication Sig Dispense Refill  . acetaminophen (TYLENOL) 325 MG tablet Take 2 tablets (650 mg total) by mouth every 4 (four) hours as needed for mild pain (or temp > 37.5 C (99.5 F)). 30 tablet 1  . amoxicillin-clavulanate (AUGMENTIN) 500-125 MG tablet Take 1 tablet (500 mg total) by mouth at bedtime. 30 tablet 2  . calcium-vitamin D (OSCAL WITH D) 500-200 MG-UNIT tablet Take 1 tablet by mouth daily.    . chlorthalidone (HYGROTON) 25 MG tablet Take 1 tablet (25 mg total) by mouth daily. 30 tablet 6  . ELIQUIS 5 MG TABS tablet TAKE ONE TABLET BY MOUTH TWICE DAILY 60 tablet 4  . fenofibrate (TRICOR) 145 MG tablet Take 1 tablet (145 mg total) by mouth daily.  30 tablet 5  . lansoprazole (PREVACID) 30 MG capsule Take 30 mg by mouth daily as needed (indigestion).     [name] PREMARIN vaginal cream Apply 1 application topically daily as needed  (irritation).     . rosuvastatin (CRESTOR) 20 MG tablet TAKE ONE TABLET DAILY AT BEDTIME 90 tablet 1  . traMADol (ULTRAM) 50 MG tablet Take 1-2 tablets (50-100 mg total) by mouth every 12 (twelve) hours as needed. (Patient taking differently: Take 50-100 mg by mouth every 12 (twelve) hours as needed for moderate pain. ) 90 tablet 2  . triamcinolone cream (KENALOG) 0.1 % Apply 1 application topically daily as needed (itching).     [name] amLODipine-benazepril (LOTREL) 10-40 MG capsule TAKE ONE CAPSULE EACH MORNING 90 capsule 0   No facility-administered medications prior to visit.     PAST MEDICAL HISTORY: Past Medical History:  Diagnosis Date  . Anxiety   . Depression   . GERD (gastroesophageal reflux disease)   . History of acute pyelonephritis    last episode 04-13-2015  w/ sepsis  . History of kidney stones   . History of recurrent UTIs    MULTIPLE  . Hyperlipidemia   . Hypertension   . OA (osteoarthritis)    knees, hips, hands  . Osteoporosis   . Poor memory    especially when has uti  . Renal calculus, left   . Stroke ([place])   . Type 2 diabetes mellitus ([place])   . Vitamin D deficiency 05/10/2016    PAST SURGICAL HISTORY: Past Surgical History:  Procedure Laterality Date  . CATARACT EXTRACTION W/PHACO  06/06/2011   Procedure: CATARACT EXTRACTION PHACO AND INTRAOCULAR LENS PLACEMENT (IOC);  Surgeon: Tonny Branch;  Location: [facility];  Service: Ophthalmology;  Laterality: Right;  CDE=12.77  . CATARACT EXTRACTION W/PHACO  06/27/2011   Procedure: CATARACT EXTRACTION PHACO AND INTRAOCULAR LENS PLACEMENT (IOC);  Surgeon: Tonny Branch;  Location: [facility];  Service: Ophthalmology;  Laterality: Left;  CDE:13.96  . CYSTOSCOPY WITH URETEROSCOPY AND STENT PLACEMENT Right 04/27/2015   Procedure: CYSTOSCOPY WITH RIGHT URETEROSCOPY, BASKET REMOVAL OF STONE, REMOVAL OF RIGHT NEPHROSTOMY TUBE;  Surgeon: Irine Seal, MD;  Location: [facility] [facility];  Service: Urology;  Laterality: Right;  .  CYSTOSCOPY/URETEROSCOPY/HOLMIUM LASER/STENT PLACEMENT Left 12/24/2016   Procedure: LEFT URETEROSCOPY WITH HOLMIUM LASER AND STENT PLACEMENT;  Surgeon: Irine Seal, MD;  Location: [facility];  Service: Urology;  Laterality: Left;  . EXTRACORPOREAL SHOCK WAVE LITHOTRIPSY  left 04-01-2016;  1980's  . FRACTURE SURGERY    . HOLMIUM LASER APPLICATION Right 27/08/5007   Procedure: HOLMIUM LASER APPLICATION;  Surgeon: Irine Seal, MD;  Location: [facility];  Service: Urology;  Laterality: Right;  . KNEE ARTHROSCOPY Bilateral right 2005//  left ?  [name] TOTAL HIP ARTHROPLASTY  10/28/2011   Procedure: TOTAL HIP ARTHROPLASTY;  Surgeon: Gearlean Alf, MD;  Location: [facility];  Service: Orthopedics;  Laterality: Right;  . TOTAL KNEE ARTHROPLASTY Bilateral left 03-09-2007//  right 2006  . TRANSTHORACIC ECHOCARDIOGRAM  10/06/2006   normal echo,  ef 55-60%  . TUBAL LIGATION      FAMILY HISTORY: Family History  Problem Relation Age of Onset  . Kidney disease Mother   . Diabetes Mother   . Congestive Heart Failure Father   . Heart disease Brother   . Alcohol abuse Brother   . Anesthesia problems Neg Hx   . Hypotension Neg Hx   . Malignant hyperthermia Neg Hx   . Pseudochol deficiency Neg  Hx     SOCIAL HISTORY: Social History   Social History  . Marital status: Married    Spouse name: N/A  . Number of children: N/A  . Years of education: N/A   Occupational History  . Not on file.   Social History Main Topics  . Smoking status: Current Every Day Smoker    Packs/day: 0.25    Years: 42.00    Types: Cigarettes  . Smokeless tobacco: Never Used  . Alcohol use No  . Drug use: No  . Sexual activity: Not on file   Other Topics Concern  . Not on file   Social History Narrative  . No narrative on file     PHYSICAL EXAM  Vitals:   03/17/17 1347  BP: (!) 148/78  Pulse: 84  Weight: 206 lb 12.8 oz (93.8 kg)  Height: 5\' 10"  (1.778 m)   Body mass index is  29.67 kg/m.  Generalized: Well developed, Obese female in no acute distress  Head: normocephalic and atraumatic,. Oropharynx benign  Neck: Supple, no carotid bruits  Cardiac: Regular rate rhythm, no murmur  Musculoskeletal: No deformity   Neurological examination   Mentation: Alert oriented to time, place, history taking. Attention span and concentration appropriate. Recent and remote memory intact.  Follows all commands speech and language fluent.   Cranial nerve II-XII: Fundoscopic exam reveals sharp disc margins.Pupils were equal round reactive to light extraocular movements were full, visual field were full on confrontational test. Facial sensation and strength were normal. hearing was intact to finger rubbing bilaterally. Uvula tongue midline. head turning and shoulder shrug were normal and symmetric.Tongue protrusion into cheek strength was normal. Motor: normal bulk and tone, full strength in the BUE, mild weakness BLE,  Sensory: normal and symmetric to light touch, pinprick, and  Vibration, in the upper and lower extremities  Coordination: finger-nose-finger, heel-to-shin bilaterally, no dysmetria Reflexes: 1+ upper lower and symmetric plantar responses were flexor bilaterally. Gait and Station: Rising up from seated position without assistance, normal stance,  moderate stride, good arm swing, smooth turning,  Favors left hip when walking with single-point cane  DIAGNOSTIC DATA (LABS, IMAGING, TESTING) - I reviewed patient records, labs, notes, testing and imaging myself where available.  Lab Results  Component Value Date   WBC 5.7 01/16/2017   HGB 12.8 01/16/2017   HCT 38.4 01/16/2017   MCV 91 01/16/2017   PLT 250 01/16/2017      Component Value Date/Time   NA 138 01/16/2017 1252   NA 137 08/15/2015 1450   K 4.1 01/16/2017 1252   K 3.4 (L) 08/15/2015 1450   CL 100 01/16/2017 1252   CO2 24 01/16/2017 1252   CO2 26 08/15/2015 1450   GLUCOSE 90 01/16/2017 1252   GLUCOSE  152 (H) 01/10/2017 0405   GLUCOSE 128 08/15/2015 1450   BUN 15 01/16/2017 1252   BUN 18.8 08/15/2015 1450   CREATININE 0.69 01/16/2017 1252   CREATININE 0.9 08/15/2015 1450   CALCIUM 11.3 (H) 01/16/2017 1252   CALCIUM 11.5 (H) 08/15/2015 1450   PROT 7.5 01/16/2017 1252   PROT 8.6 (H) 08/15/2015 1450   ALBUMIN 4.4 01/16/2017 1252   ALBUMIN 4.0 08/15/2015 1450   AST 41 (H) 01/16/2017 1252   AST 27 08/15/2015 1450   ALT 32 01/16/2017 1252   ALT 27 08/15/2015 1450   ALKPHOS 64 01/16/2017 1252   ALKPHOS 66 08/15/2015 1450   BILITOT 0.4 01/16/2017 1252   BILITOT 0.47 08/15/2015 1450   GFRNONAA  84 01/16/2017 1252   GFRNONAA 88 10/02/2012 1301   GFRAA 96 01/16/2017 1252   GFRAA >89 10/02/2012 1301   Lab Results  Component Value Date   CHOL 222 (H) 01/10/2017   HDL 31 (L) 01/10/2017   LDLCALC UNABLE TO CALCULATE IF TRIGLYCERIDE OVER 400 mg/dL 01/10/2017   TRIG 490 (H) 01/10/2017   CHOLHDL 7.2 01/10/2017   Lab Results  Component Value Date   HGBA1C 7.3 (H) 01/10/2017    ASSESSMENT AND PLAN  81 y.o. year old female  here to follow-up post hospitalization for left MCA infarct. She has risk factors of hypertension hyperlipidemia and suspected atrial fibrillation. She is to get a loop recorder. She has some mild weakness in her lower extremities and is currently receiving physical therapy.The patient is a current patient of Dr. Leonie Man  who is out of the office today . This note is sent to the work in doctor.      Stressed the importance of management of risk factors to prevent further stroke Continue Eliquis for secondary stroke prevention and suspected at [facility] strict control of hypertension with blood pressure goal below 130/90, today's [phone] continue antihypertensive medications Control of diabetes with hemoglobin A1c below 6.5 followed by primary care most recent hemoglobin A1c 7.3 Cholesterol with LDL cholesterol less than 70, followed by primary care,  continue  statin drugs crestor Continue PT then HEP  eat healthy diet with whole grains,  fresh fruits and vegetables F/U in 6 months Discussed risk for recurrent stroke/ TIA and answered additional questions This was a visit requiring 30 minutes of  medical decision making of high complexity with extensive review of history, hospital chart, counseling and answering questions Dennie Bible, [facility], [facility], APRN  [facility] [address], [place], [place] [address] [phone]

## 2017-03-18 ENCOUNTER — Telehealth: Payer: Self-pay | Admitting: *Deleted

## 2017-03-18 ENCOUNTER — Other Ambulatory Visit: Payer: Self-pay | Admitting: Family

## 2017-03-18 NOTE — Telephone Encounter (Signed)
Notes recorded by Laurine Blazer, LPN on 03/17/9149 at 56:97 AM EDT Daughter [facility]) notified. Copy to pmd. ------  Notes recorded by Herminio Commons, MD on 03/17/2017 at 5:22 PM EDT No abnormal heart rhythms.

## 2017-03-20 NOTE — Progress Notes (Signed)
I reviewed above note and agree with the assessment and plan.  As mentioned in the note, cardiology has reviewed all the EKGs and did 30 day event monitoring, all of them did not show afib, but frequent PVCs. They have referred pt for loop recorder. OK to continue eliquis at this time, but needs close follow up regarding the loop result.   Rosalin Hawking, MD PhD Stroke Neurology 03/20/2017 4:26 PM

## 2017-03-25 ENCOUNTER — Telehealth: Payer: Self-pay | Admitting: Cardiovascular Disease

## 2017-03-25 NOTE — Telephone Encounter (Signed)
Caitlin Mccarthy called in regards to the heart monitor that patient wore. States that she is uncertain As to what was actually said.  [phone].

## 2017-03-25 NOTE — Telephone Encounter (Signed)
Patients daughter notified and verbalized understanding 

## 2017-03-25 NOTE — Telephone Encounter (Signed)
Tell her to keep her appointment with Dr. Rayann Heman tomorrow and she can discuss it with him and obtain his opinion.

## 2017-03-25 NOTE — Telephone Encounter (Signed)
Patient is having second thoughts regarding loop recorder. Patient curious since monitor results were normal if this is still an urgent need? Patient has appointment with [facility] at 11:30 tomorrow. Daughter states patient has had a lot going on with other health issues (skin cancer) and if this could be postponed they would prefer that. If its urgent and needs to be done ASAP they will do it.

## 2017-03-26 ENCOUNTER — Encounter: Payer: Self-pay | Admitting: Internal Medicine

## 2017-03-26 ENCOUNTER — Ambulatory Visit (INDEPENDENT_AMBULATORY_CARE_PROVIDER_SITE_OTHER): Payer: Medicare HMO | Admitting: Internal Medicine

## 2017-03-26 VITALS — BP 140/78 | HR 87 | Ht 70.0 in | Wt 206.0 lb

## 2017-03-26 DIAGNOSIS — I63419 Cerebral infarction due to embolism of unspecified middle cerebral artery: Secondary | ICD-10-CM

## 2017-03-26 NOTE — Progress Notes (Signed)
ELECTROPHYSIOLOGY CONSULT NOTE  Patient ID: Caitlin Mccarthy MRN: [id number], DOB/AGE: 79/09/1935   Admit date: (Not on file) Date of Consult: 03/26/2017  Primary Physician: [name], [place] Primary Cardiologist: Dr Bronson Ing Reason for Consultation: Cryptogenic stroke; recommendations regarding Implantable Loop Recorder, referred by Dr Bronson Ing  History of Present Illness Caitlin Mccarthy was admitted on 01/09/17 with acute CVA. she has been monitored on telemetry which had sinus with frequent PACs but no documented arrhythmias. No cause has been identified. Inpatient stroke work-up was performed by Dr Leonie Man.  He felt that due to irregular heart beat that she should be started on eliquis.  She has taken eliquis sinice that time.  She has since followed up with Dr Bronson Ing.  30 day monitor did not show any afib.  He feels that better characterization of her heart rhythm and long term monitoring to evaluated for afib is required.   EP has been asked to evaluate for placement of an implantable loop recorder to monitor for atrial fibrillation post stroke.  Past Medical History Past Medical History:  Diagnosis Date  . Anxiety   . Depression   . GERD (gastroesophageal reflux disease)   . History of acute pyelonephritis    last episode 04-13-2015  w/ sepsis  . History of kidney stones   . History of recurrent UTIs    MULTIPLE  . Hyperlipidemia   . Hypertension   . OA (osteoarthritis)    knees, hips, hands  . Osteoporosis   . Poor memory    especially when has uti  . Renal calculus, left   . Stroke ([place])   . Type 2 diabetes mellitus ([place])   . Vitamin D deficiency 05/10/2016    Past Surgical History Past Surgical History:  Procedure Laterality Date  . CATARACT EXTRACTION W/PHACO  06/06/2011   Procedure: CATARACT EXTRACTION PHACO AND INTRAOCULAR LENS PLACEMENT (IOC);  Surgeon: Tonny Branch;  Location: [facility];  Service: Ophthalmology;  Laterality: Right;  CDE=12.77  .  CATARACT EXTRACTION W/PHACO  06/27/2011   Procedure: CATARACT EXTRACTION PHACO AND INTRAOCULAR LENS PLACEMENT (IOC);  Surgeon: Tonny Branch;  Location: [facility];  Service: Ophthalmology;  Laterality: Left;  CDE:13.96  . CYSTOSCOPY WITH URETEROSCOPY AND STENT PLACEMENT Right 04/27/2015   Procedure: CYSTOSCOPY WITH RIGHT URETEROSCOPY, BASKET REMOVAL OF STONE, REMOVAL OF RIGHT NEPHROSTOMY TUBE;  Surgeon: Irine Seal, MD;  Location: [facility];  Service: Urology;  Laterality: Right;  . CYSTOSCOPY/URETEROSCOPY/HOLMIUM LASER/STENT PLACEMENT Left 12/24/2016   Procedure: LEFT URETEROSCOPY WITH HOLMIUM LASER AND STENT PLACEMENT;  Surgeon: Irine Seal, MD;  Location: [facility];  Service: Urology;  Laterality: Left;  . EXTRACORPOREAL SHOCK WAVE LITHOTRIPSY  left 04-01-2016;  1980's  . FRACTURE SURGERY    . HOLMIUM LASER APPLICATION Right 73/53/2992   Procedure: HOLMIUM LASER APPLICATION;  Surgeon: Irine Seal, MD;  Location: [facility];  Service: Urology;  Laterality: Right;  . KNEE ARTHROSCOPY Bilateral right 2005//  left ?  [name] TOTAL HIP ARTHROPLASTY  10/28/2011   Procedure: TOTAL HIP ARTHROPLASTY;  Surgeon: Gearlean Alf, MD;  Location: [facility];  Service: Orthopedics;  Laterality: Right;  . TOTAL KNEE ARTHROPLASTY Bilateral left 03-09-2007//  right 2006  . TRANSTHORACIC ECHOCARDIOGRAM  10/06/2006   normal echo,  ef 55-60%  . TUBAL LIGATION      Allergies/Intolerances No Known Allergies  Medicines reviewed   Social History Social History   Social History  . Marital status: Married    Spouse name: N/A  .  Number of children: 2  . Years of education: 11   Occupational History  .      retired   Social History Main Topics  . Smoking status: Current Every Day Smoker    Packs/day: 0.25    Years: 42.00    Types: Cigarettes  . Smokeless tobacco: Never Used     Comment: 03/17/17 haven't qiut completely  . Alcohol use No  . Drug use: No  . Sexual  activity: Not on file   Other Topics Concern  . Not on file   Social History Narrative   Lives with husband    The patient has a family history CHF (father)  Review of Systems General: No chills, fever, night sweats or weight changes  Cardiovascular:  No chest pain, dyspnea on exertion, edema, orthopnea, palpitations, paroxysmal nocturnal dyspnea Dermatological: No rash, lesions or masses Respiratory: No cough, dyspnea Urologic: No hematuria, dysuria Abdominal: No nausea, vomiting, diarrhea, bright red blood per rectum, melena, or hematemesis Neurologic: No visual changes, weakness, changes in mental status All other systems reviewed and are otherwise negative except as noted above.  Physical Exam Blood pressure 140/78, pulse 87, height 5\' 10"  (1.778 m), weight 206 lb (93.4 kg), SpO2 94 %.  General: Well developed, well appearing 81 y.o. female in no acute distress. HEENT: Normocephalic, atraumatic. EOMs intact. Sclera nonicteric. Oropharynx clear.  Neck: Supple without bruits. No JVD. Lungs: Respirations regular and unlabored, CTA bilaterally. No wheezes, rales or rhonchi. Heart: RRR. S1, S2 present. No murmurs, rub, S3 or S4. Abdomen: Soft, non-tender, non-distended. BS present x 4 quadrants. No hepatosplenomegaly.  Extremities: No clubbing, cyanosis or edema. DP/PT/Radials 2+ and equal bilaterally. Psych: Normal affect. Neuro: Alert and oriented X 3. Moves all extremities spontaneously. Musculoskeletal: No kyphosis. Skin: Intact. Warm and dry. No rashes or petechiae in exposed areas.   Labs Lab Results  Component Value Date   WBC 5.7 01/16/2017   HGB 12.8 01/16/2017   HCT 38.4 01/16/2017   MCV 91 01/16/2017   PLT 250 01/16/2017   No results for input(s): NA, K, CL, CO2, BUN, CREATININE, CALCIUM, PROT, BILITOT, ALKPHOS, ALT, AST, GLUCOSE in the last 168 hours.  Invalid input(s): LABALBU No results for input(s): INR in the last 72 hours.  Radiology/Studies No results  found.  Echocardiogram  Echo from 01/10/17 is reviewed  12-lead ECG all ekgs in epic are reviewed and reveal no afib Telemetry strips in epic from hospitalization in July reveal only sinus with PACs  30 day monitor is reviewed and reveals no afib   Assessment and Plan 1. Cryptogenic stroke The patient is s/p stroke.  Though she has not had afib well documented, she has been placed on eliquis.  Dr Bronson Ing feels that better characterization of her heart rhythm and long term monitoring to evaluated for afib is required.  I agree.  I worry about risks of long term anticoagulation in the absence of documented afib.  The indication for loop recorder insertion / monitoring for AF in setting of cryptogenic stroke was discussed with the patient and her daughter. The loop recorder insertion procedure was reviewed in detail including risks and benefits. These risks include but are not limited to bleeding and infection. The patient expressed verbal understanding and agrees to proceed. The patient was also counseled regarding wound care and device follow-up.  She will follow with Dr Bronson Ing post implant.  I would advise that we continue eliquis for at least 6 months during which long term monitoring is  performed.  If she does not have afib detected then at that time we could consider stopping eliquis and continuing long term monitoring.  Army Fossa 03/26/2017, 11:57 AM

## 2017-03-26 NOTE — Patient Instructions (Signed)
Medication Instructions:  Your physician recommends that you continue on your current medications as directed. Please refer to the Current Medication list given to you today.   Labwork: None ordered   Testing/Procedures:  LINQ implant 04/01/2017  Please arrive at [facility] at 6:30am    Follow-Up: Your physician recommends that you schedule a follow-up appointment in: 10-14 days in device clinic for wound check.   Any Other Special Instructions Will Be Listed Below (If Applicable).     If you need a refill on your cardiac medications before your next appointment, please call your pharmacy.

## 2017-03-31 ENCOUNTER — Telehealth: Payer: Self-pay | Admitting: Cardiovascular Disease

## 2017-03-31 ENCOUNTER — Encounter: Payer: Self-pay | Admitting: Family

## 2017-03-31 ENCOUNTER — Ambulatory Visit (INDEPENDENT_AMBULATORY_CARE_PROVIDER_SITE_OTHER): Payer: Medicare HMO | Admitting: Family

## 2017-03-31 VITALS — BP 142/85 | HR 75 | Temp 98.4°F | Ht 70.0 in | Wt 207.0 lb

## 2017-03-31 DIAGNOSIS — I4891 Unspecified atrial fibrillation: Secondary | ICD-10-CM | POA: Diagnosis not present

## 2017-03-31 DIAGNOSIS — I10 Essential (primary) hypertension: Secondary | ICD-10-CM

## 2017-03-31 NOTE — Progress Notes (Signed)
   Subjective:    Patient ID: Caitlin Mccarthy, female    DOB: Aug 18, 1935, 81 y.o.   MRN: [id number]  Pt presents to the office today to recheck lab work after adding [place] 25 mg from her Cardiologists. Per pt she was told to stop amlodipine 10 mg and benazepril 40 mg.  Hypertension  This is a chronic problem. The current episode started more than 1 year ago. The problem has been waxing and waning since onset. The problem is controlled. Associated symptoms include malaise/fatigue. Pertinent negatives include no peripheral edema or shortness of breath. Risk factors for coronary artery disease include dyslipidemia, obesity, post-menopausal state and sedentary lifestyle. The current treatment provides moderate improvement. Hypertensive end-organ damage includes CAD/MI and CVA. There is no history of a thyroid problem.     Review of Systems  Constitutional: Positive for fatigue and malaise/fatigue.  Respiratory: Negative for shortness of breath.   All other systems reviewed and are negative.      Objective:   Physical Exam  Constitutional: She is oriented to person, place, and time. She appears well-developed and well-nourished. No distress.  HENT:  Head: Normocephalic.  Eyes: Pupils are equal, round, and reactive to light.  Neck: Normal range of motion. Neck supple. No thyromegaly present.  Cardiovascular: Normal rate, regular rhythm, normal heart sounds and intact distal pulses.   No murmur heard. Pulmonary/Chest: Effort normal and breath sounds normal. No respiratory distress. She has no wheezes.  Abdominal: Soft. Bowel sounds are normal. She exhibits no distension. There is no tenderness.  Musculoskeletal: Normal range of motion. She exhibits no edema or tenderness.  Neurological: She is alert and oriented to person, place, and time.  Skin: Skin is warm and dry.  Psychiatric: She has a normal mood and affect. Her behavior is normal. Judgment and thought content normal.  Vitals  reviewed.     BP 127/77   Pulse 85   Temp 98.4 F (36.9 C) (Oral)   Ht '5\' 10"'$  (1.778 m)   Wt 207 lb (93.9 kg)   BMI 29.70 kg/m      Assessment & Plan:  1. Essential hypertension, benign Pt's BP is stable Advised pt to call Cardiologists and verify she was suppose to stop her amlodipine 10 mg and benazepril 40 mg? Her BP is one of the better readings she has had? Labs pending - BMP8+EGFR    Evelina Dun, FNP

## 2017-03-31 NOTE — Patient Instructions (Signed)

## 2017-03-31 NOTE — Telephone Encounter (Signed)
Since 9/7 office visit - pt assumed with addition of chlorthalidone she should stop Lotrel (did not read AVS)

## 2017-03-31 NOTE — Addendum Note (Signed)
Addended by: Liliane Bade on: 03/31/2017 11:30 AM   Modules accepted: Orders

## 2017-03-31 NOTE — Telephone Encounter (Signed)
BP was normal at PCP's office. How long has she been off of Lotrel?

## 2017-03-31 NOTE — Telephone Encounter (Signed)
Caitlin Mccarthy -daughter called .  They are concerned about patient's BP medications  Daughter states that she is taking the Chlorthalidone 25mg  but not taking any other BP medications.  . Please call [phone].

## 2017-03-31 NOTE — Telephone Encounter (Signed)
Caitlin Commons, MD  Caitlin Mccarthy, CMA        BP normal at PCP's office today. Patient had stopped taking Lotrel and was only taking chlorthalidone (she was supposed to be taking both given her h/o stroke).  She is getting a loop recorder implant tomorrow.  Will need to follow up on labs done today.    Will f/u on labs done at [facility]

## 2017-03-31 NOTE — Telephone Encounter (Signed)
Pt daughter wanted to confirm with Dr Bronson Ing if pt should be taking Lotrel and chlorthalidone - as per 9/7 OV pt was to be taking both but Lotrel not currently on medication list - pt c/o fatigue and has not been taking Lotrel only chlorthalidone. Pt had labs done today at [facility] - daughter did not have BP reading at this time.

## 2017-04-01 ENCOUNTER — Encounter (HOSPITAL_COMMUNITY): Payer: Self-pay | Admitting: Internal Medicine

## 2017-04-01 ENCOUNTER — Ambulatory Visit (HOSPITAL_COMMUNITY)
Admission: RE | Admit: 2017-04-01 | Discharge: 2017-04-01 | Disposition: A | Discharge status: Home / Self Care | Payer: Medicare HMO | Source: Ambulatory Visit | Attending: Internal Medicine | Admitting: Internal Medicine

## 2017-04-01 ENCOUNTER — Encounter (HOSPITAL_COMMUNITY)
Admission: RE | Disposition: A | Discharge status: Home / Self Care | Payer: Self-pay | Source: Ambulatory Visit | Attending: Internal Medicine

## 2017-04-01 DIAGNOSIS — E119 Type 2 diabetes mellitus without complications: Secondary | ICD-10-CM | POA: Diagnosis not present

## 2017-04-01 DIAGNOSIS — M19041 Primary osteoarthritis, right hand: Secondary | ICD-10-CM | POA: Insufficient documentation

## 2017-04-01 DIAGNOSIS — E559 Vitamin D deficiency, unspecified: Secondary | ICD-10-CM | POA: Diagnosis not present

## 2017-04-01 DIAGNOSIS — Z7901 Long term (current) use of anticoagulants: Secondary | ICD-10-CM | POA: Insufficient documentation

## 2017-04-01 DIAGNOSIS — M160 Bilateral primary osteoarthritis of hip: Secondary | ICD-10-CM | POA: Diagnosis not present

## 2017-04-01 DIAGNOSIS — Z79899 Other long term (current) drug therapy: Secondary | ICD-10-CM | POA: Insufficient documentation

## 2017-04-01 DIAGNOSIS — F419 Anxiety disorder, unspecified: Secondary | ICD-10-CM | POA: Insufficient documentation

## 2017-04-01 DIAGNOSIS — E785 Hyperlipidemia, unspecified: Secondary | ICD-10-CM | POA: Insufficient documentation

## 2017-04-01 DIAGNOSIS — I10 Essential (primary) hypertension: Secondary | ICD-10-CM | POA: Diagnosis not present

## 2017-04-01 DIAGNOSIS — M19042 Primary osteoarthritis, left hand: Secondary | ICD-10-CM | POA: Diagnosis not present

## 2017-04-01 DIAGNOSIS — F17210 Nicotine dependence, cigarettes, uncomplicated: Secondary | ICD-10-CM | POA: Insufficient documentation

## 2017-04-01 DIAGNOSIS — K219 Gastro-esophageal reflux disease without esophagitis: Secondary | ICD-10-CM | POA: Insufficient documentation

## 2017-04-01 DIAGNOSIS — M170 Bilateral primary osteoarthritis of knee: Secondary | ICD-10-CM | POA: Diagnosis not present

## 2017-04-01 DIAGNOSIS — M810 Age-related osteoporosis without current pathological fracture: Secondary | ICD-10-CM | POA: Insufficient documentation

## 2017-04-01 DIAGNOSIS — I639 Cerebral infarction, unspecified: Secondary | ICD-10-CM | POA: Diagnosis not present

## 2017-04-01 DIAGNOSIS — I6789 Other cerebrovascular disease: Secondary | ICD-10-CM

## 2017-04-01 HISTORY — PX: LOOP RECORDER INSERTION: EP1214

## 2017-04-01 LAB — BASIC METABOLIC PANEL
BUN/Creatinine Ratio: 23 (ref 12–28)
BUN: 19 mg/dL (ref 8–27)
CALCIUM: 11 mg/dL — AB (ref 8.7–10.3)
CHLORIDE: 95 mmol/L — AB (ref 96–106)
CO2: 30 mmol/L — AB (ref 20–29)
Creatinine, Ser: 0.81 mg/dL (ref 0.57–1.00)
GFR calc Af Amer: 80 mL/min/{1.73_m2} (ref >59)
GFR calc non Af Amer: 70 mL/min/{1.73_m2} (ref >59)
Glucose: 184 mg/dL — ABNORMAL HIGH (ref 65–99)
POTASSIUM: 4.1 mmol/L (ref 3.5–5.2)
Sodium: 137 mmol/L (ref 134–144)

## 2017-04-01 SURGERY — LOOP RECORDER INSERTION

## 2017-04-01 MED ORDER — LIDOCAINE-EPINEPHRINE 1 %-1:100000 IJ SOLN
INTRAMUSCULAR | Status: DC | PRN
  Administered 2017-04-01: 10 mL

## 2017-04-01 MED ORDER — LIDOCAINE-EPINEPHRINE 1 %-1:100000 IJ SOLN
INTRAMUSCULAR | Status: AC
  Filled 2017-04-01: qty 1

## 2017-04-01 SURGICAL SUPPLY — 2 items
LOOP REVEAL LINQSYS (Prosthesis & Implant Heart) ×1 IMPLANT
PACK LOOP INSERTION (CUSTOM PROCEDURE TRAY) ×2 IMPLANT

## 2017-04-01 NOTE — Discharge Instructions (Signed)
See chart. Pt given instructions verbally and in writing.  Pt watched information video

## 2017-04-09 DIAGNOSIS — N200 Calculus of kidney: Secondary | ICD-10-CM | POA: Diagnosis not present

## 2017-04-09 DIAGNOSIS — R3915 Urgency of urination: Secondary | ICD-10-CM | POA: Diagnosis not present

## 2017-04-09 DIAGNOSIS — N3020 Other chronic cystitis without hematuria: Secondary | ICD-10-CM | POA: Diagnosis not present

## 2017-04-10 ENCOUNTER — Telehealth: Payer: Self-pay | Admitting: Internal Medicine

## 2017-04-10 NOTE — Telephone Encounter (Signed)
Informed patient's daughter that remote transmissions have been coming through. Patient's daughter verbalized understanding.

## 2017-04-10 NOTE — Telephone Encounter (Signed)
°  New Prob  Calling to verify transmissions from patient have been received by our office[place] verify transmissions are going through okay.

## 2017-04-14 ENCOUNTER — Ambulatory Visit (INDEPENDENT_AMBULATORY_CARE_PROVIDER_SITE_OTHER): Payer: Self-pay | Admitting: *Deleted

## 2017-04-14 DIAGNOSIS — I4891 Unspecified atrial fibrillation: Secondary | ICD-10-CM

## 2017-04-14 DIAGNOSIS — I63419 Cerebral infarction due to embolism of unspecified middle cerebral artery: Secondary | ICD-10-CM

## 2017-04-14 LAB — CUP PACEART INCLINIC DEVICE CHECK
Date Time Interrogation Session: 20181015113224
MDC IDC PG IMPLANT DT: 20181002

## 2017-04-14 NOTE — Progress Notes (Signed)
Loop wound check in clinic. Steri-strips removed by patient prior to appointment. Incision edges approximated, wound well healed. Patient educated about wound care and Carelink monitoring. Battery status: good. R-waves 0.24mV. 0 symptom episodes, 0 tachy episodes. 2 AF episodes (<0.1% burden)- ECGs show sinus rhythm with ectopy. Monthly summary reports and ROV with JA PRN.

## 2017-04-21 ENCOUNTER — Ambulatory Visit (INDEPENDENT_AMBULATORY_CARE_PROVIDER_SITE_OTHER): Payer: Medicare HMO | Admitting: Nurse Practitioner

## 2017-04-21 ENCOUNTER — Encounter: Payer: Self-pay | Admitting: Nurse Practitioner

## 2017-04-21 VITALS — BP 132/72 | HR 78 | Temp 97.8°F | Ht 70.0 in | Wt 209.0 lb

## 2017-04-21 DIAGNOSIS — E1165 Type 2 diabetes mellitus with hyperglycemia: Secondary | ICD-10-CM

## 2017-04-21 DIAGNOSIS — Z23 Encounter for immunization: Secondary | ICD-10-CM | POA: Diagnosis not present

## 2017-04-21 DIAGNOSIS — E785 Hyperlipidemia, unspecified: Secondary | ICD-10-CM

## 2017-04-21 LAB — BAYER DCA HB A1C WAIVED: HB A1C (BAYER DCA - WAIVED): 7.3 % — ABNORMAL HIGH (ref <7.0)

## 2017-04-21 MED ORDER — CANAGLIFLOZIN 100 MG PO TABS: 100.0000 mg | ORAL_TABLET | Freq: Every day | ORAL | 0 refills | Status: DC

## 2017-04-21 NOTE — Progress Notes (Signed)
   Subjective:    Patient ID: Caitlin Mccarthy, female    DOB: August 23, 1935, 81 y.o.   MRN: [id number]  HPI  Patient comes in today to discuss her diabetes. She is currently on no meds. She was on metformin and stopped it 6 months ago because it caused such bad diarrhea. She says that her blood sugars are running in the 190's early in mornings. Her last HGBA1c was 6.8 back on May 2018.  *cardiologist wanted her cholesterol rechecked  Review of Systems  Constitutional: Negative for activity change and appetite change.  HENT: Negative.   Eyes: Negative for pain.  Respiratory: Negative for shortness of breath.   Cardiovascular: Negative for chest pain, palpitations and leg swelling.  Gastrointestinal: Negative for abdominal pain.  Endocrine: Negative for polydipsia.  Genitourinary: Negative.   Skin: Negative for rash.  Neurological: Negative for dizziness, weakness and headaches.  Hematological: Does not bruise/bleed easily.  Psychiatric/Behavioral: Negative.   All other systems reviewed and are negative.      Objective:   Physical Exam  Constitutional: She is oriented to person, place, and time. She appears well-developed and well-nourished.  HENT:  Nose: Nose normal.  Mouth/Throat: Oropharynx is clear and moist.  Eyes: EOM are normal.  Neck: Trachea normal, normal range of motion and full passive range of motion without pain. Neck supple. No JVD present. Carotid bruit is not present. No thyromegaly present.  Cardiovascular: Normal rate, regular rhythm, normal heart sounds and intact distal pulses.  Exam reveals no gallop and no friction rub.   No murmur heard. Pulmonary/Chest: Effort normal and breath sounds normal.  Abdominal: Soft. Bowel sounds are normal. She exhibits no distension and no mass. There is no tenderness.  Musculoskeletal: Normal range of motion.  Lymphadenopathy:    She has no cervical adenopathy.  Neurological: She is alert and oriented to person, place, and  time. She has normal reflexes.  Skin: Skin is warm and dry.  Psychiatric: She has a normal mood and affect. Her behavior is normal. Judgment and thought content normal.   BP 132/72   Pulse 78   Temp 97.8 F (36.6 C) (Oral)   Ht 5\' 10"  (1.778 m)   Wt 209 lb (94.8 kg)   BMI 29.99 kg/m   hgba1c 7.3%      Assessment & Plan:  1. Type 2 diabetes mellitus with hyperglycemia, without long-term current use of insulin (HCC) Added invokana to meds Follow up in 1 month kep diary of blood sugars - Bayer DCA Hb A1c Waived  2. Hyperlipidemia with target LDL less than 100 Low fat discussed - Lipid panel  Mary-Margaret Hassell Done, FNP

## 2017-04-21 NOTE — Patient Instructions (Signed)

## 2017-04-22 LAB — LIPID PANEL
CHOLESTEROL TOTAL: 151 mg/dL (ref 100–199)
Chol/HDL Ratio: 4.2 ratio (ref 0.0–4.4)
HDL: 36 mg/dL — ABNORMAL LOW (ref >39)
LDL Calculated: 77 mg/dL (ref 0–99)
Triglycerides: 189 mg/dL — ABNORMAL HIGH (ref 0–149)
VLDL Cholesterol Cal: 38 mg/dL (ref 5–40)

## 2017-04-28 ENCOUNTER — Other Ambulatory Visit: Payer: Self-pay | Admitting: Family

## 2017-04-29 NOTE — Progress Notes (Signed)
Office Visit Note  Patient: Caitlin Mccarthy             Date of Birth: 16-Jan-1936           MRN: [id number]             PCP: [name], FNP Referring: [name], * Visit Date: 05/12/2017 Occupation: @GUAROCC @    Subjective:  Osteoarthritis (Bil pain, severe)   History of Present Illness: Caitlin Mccarthy is a 81 y.o. female with history of osteoarthritis and osteoporosis.She continues to have discomfort in her bilateral knees which are replaced. Her right total hip replacement is doing well. She states she's been having some tingling and burning sensation in her feet. She also has insomnia due to that.  Activities of Daily Living:  Patient reports morning stiffness for 24 hours.   Patient Denies nocturnal pain.  Difficulty dressing/grooming: Denies Difficulty climbing stairs: Reports Difficulty getting out of chair: Reports Difficulty using hands for taps, buttons, cutlery, and/or writing: Reports   Review of Systems  Constitutional: Positive for fatigue and weakness. Negative for night sweats, weight gain and weight loss.  HENT: Positive for mouth dryness. Negative for mouth sores, trouble swallowing, trouble swallowing and nose dryness.   Eyes: Positive for dryness. Negative for pain, redness, itching and visual disturbance.  Respiratory: Negative for cough, shortness of breath and difficulty breathing.   Cardiovascular: Negative for chest pain, palpitations, hypertension, irregular heartbeat and swelling in legs/feet.  Gastrointestinal: Positive for heartburn. Negative for blood in stool, constipation and diarrhea.  Endocrine: Negative for cold intolerance, excessive thirst and increased urination.  Genitourinary: Negative for difficulty urinating and vaginal dryness.  Musculoskeletal: Positive for arthralgias, gait problem, joint pain and morning stiffness. Negative for joint swelling, myalgias, muscle weakness, muscle tenderness and myalgias.    Skin: Negative for color change, rash, hair loss, skin tightness, ulcers and sensitivity to sunlight.  Allergic/Immunologic: Negative for susceptible to infections.  Neurological: Positive for light-headedness. Negative for dizziness, numbness, memory loss and night sweats.  Hematological: Negative for bruising/bleeding tendency and swollen glands.  Psychiatric/Behavioral: Positive for sleep disturbance. Negative for depressed mood. The patient is not nervous/anxious.     PMFS History:  Patient Active Problem List   Diagnosis Date Noted  . Vocal cord nodule 01/11/2017  . CVA (cerebral vascular accident) ([place]) 01/10/2017  . Hypertension 01/09/2017  . OA (osteoarthritis) 01/09/2017  . History of kidney stones 01/09/2017  . Hyperlipidemia 01/09/2017  . Abnormal urinalysis 01/09/2017  . Acute metabolic encephalopathy 71/11/2692  . Dysarthria 01/09/2017  . Anxiety 01/09/2017  . Embolic stroke ([place]) 85/46/2703  . Glaucoma 05/10/2016  . Vitamin D deficiency 05/10/2016  . Current smoker 05/10/2016  . Osteoarthritis of both hands 05/10/2016  . History of total right hip replacement 05/10/2016  . History of total knee replacement, bilateral 05/10/2016  . Abnormal immunoelectrophoresis 05/10/2016  . Osteoporosis 05/09/2016  . Type 2 diabetes mellitus ([place]) 02/05/2016  . Overweight (BMI 25.0-29.9) 12/28/2015  . GAD (generalized anxiety disorder) 12/28/2015  . Hypokalemia 06/02/2015  . Acute encephalopathy 04/13/2015  . Chronic pain 04/13/2015  . Metabolic syndrome 50/03/3817  . Arthritis 11/29/2013  . Essential hypertension, benign 10/02/2012  . Hyperlipidemia with target LDL less than 100 10/02/2012  . GERD (gastroesophageal reflux disease) 10/02/2012  . Depression 10/02/2012  . OA (osteoarthritis) of hip 10/28/2011    Past Medical History:  Diagnosis Date  . Anxiety   . Depression   . GERD (gastroesophageal reflux disease)   . History of  acute pyelonephritis    last episode  04-13-2015  w/ sepsis  . History of kidney stones   . History of recurrent UTIs    MULTIPLE  . Hyperlipidemia   . Hypertension   . OA (osteoarthritis)    knees, hips, hands  . Osteoporosis   . Poor memory    especially when has uti  . Renal calculus, left   . Stroke ([place])   . Type 2 diabetes mellitus ([place])   . Vitamin D deficiency 05/10/2016    Family History  Problem Relation Age of Onset  . Kidney disease Mother   . Diabetes Mother   . Congestive Heart Failure Father   . Heart failure Father   . Heart disease Brother   . Alcohol abuse Brother   . Anesthesia problems Neg Hx   . Hypotension Neg Hx   . Malignant hyperthermia Neg Hx   . Pseudochol deficiency Neg Hx    Past Surgical History:  Procedure Laterality Date  . EXTRACORPOREAL SHOCK WAVE LITHOTRIPSY  left 04-01-2016;  1980's  . FRACTURE SURGERY    . HIP ARTHROPLASTY    . KNEE ARTHROPLASTY    . KNEE ARTHROSCOPY Bilateral right 2005//  left ?  [name] TOTAL KNEE ARTHROPLASTY Bilateral left 03-09-2007//  right 2006  . TRANSTHORACIC ECHOCARDIOGRAM  10/06/2006   normal echo,  ef 55-60%  . TUBAL LIGATION     Social History   Social History Narrative   Lives with husband     Objective: Vital Signs: BP (!) 115/49 (BP Location: Left Arm, Patient Position: Sitting, Cuff Size: Normal)   Pulse 72   Resp 18   Ht 5\' 10"  (1.778 m)   Wt 211 lb (95.7 kg)   BMI 30.28 kg/m    Physical Exam  Constitutional: She is oriented to person, place, and time. She appears well-developed and well-nourished.  HENT:  Head: Normocephalic and atraumatic.  Eyes: Conjunctivae and EOM are normal.  Neck: Normal range of motion.  Cardiovascular: Normal rate, regular rhythm, normal heart sounds and intact distal pulses.  Pulmonary/Chest: Effort normal and breath sounds normal.  Abdominal: Soft. Bowel sounds are normal.  Lymphadenopathy:    She has no cervical adenopathy.  Neurological: She is alert and oriented to person, place, and time.    Skin: Skin is warm and dry. Capillary refill takes less than 2 seconds.  Psychiatric: She has a normal mood and affect. Her behavior is normal.  Nursing note and vitals reviewed.    Musculoskeletal Exam: c-spine and thoracic lumbar spine good range of motion. She has good range of motion of her shoulders elbows. She has osteoarthritic changes in her hands with PIP/DIP thickening. Her right total hip replacement is doing well. She had no swelling in her knee joints but she has discomfort range of motion of her bilateral knees which has been replaced. No synovitis was noted in any of her joints on exam today.  CDAI Exam: No CDAI exam completed.    Investigation: No additional findings. CBC Latest Ref Rng & Units 01/16/2017 01/09/2017 01/09/2017  WBC 3.4 - 10.8 x10E3/uL 5.7 - 4.9  Hemoglobin 11.1 - 15.9 g/dL 12.8 14.3 13.0  Hematocrit 34.0 - 46.6 % 38.4 42.0 40.5  Platelets 150 - 379 x10E3/uL 250 - 299   CMP Latest Ref Rng & Units 03/31/2017 01/16/2017 01/10/2017  Glucose 65 - 99 mg/dL 184(H) 90 152(H)  BUN 8 - 27 mg/dL 19 15 9   Creatinine 0.57 - 1.00 mg/dL 0.81 0.69 0.66  Sodium 134 - 144 mmol/L 137 138 139  Potassium 3.5 - 5.2 mmol/L 4.1 4.1 3.8  Chloride 96 - 106 mmol/L 95(L) 100 108  CO2 20 - 29 mmol/L 30(H) 24 25  Calcium 8.7 - 10.3 mg/dL 11.0(H) 11.3(H) 10.1  Total Protein 6.0 - 8.5 g/dL - 7.5 -  Total Bilirubin 0.0 - 1.2 mg/dL - 0.4 -  Alkaline Phos 39 - 117 IU/L - 64 -  AST 0 - 40 IU/L - 41(H) -  ALT 0 - 32 IU/L - 32 -    Imaging: No results found.  Speciality Comments: No specialty comments available.    Procedures:  No procedures performed Allergies: Patient has no known allergies.   Assessment / Plan:     Visit Diagnoses: Primary osteoarthritis of both hands: Joint protection and muscle strengthening discussed.  Primary osteoarthritis of left TMA:UQJFHLK discomfort  History of total right hip replacement: Doing well  History of total knee replacement,  bilateral: She continues to have chronic pain. She ran out of tramadol.I've advised her to contact her PCP to get refill.she complains of instability and lower extremity weakness. She doesn't really well in the past with physical therapy. We discussed possible physical therapy in future she will contact me.  Other osteoporosis without current pathological fracture - Most recent DEXA was reviewed high which is consistent with osteoporosis. I noted that she's been started on Fosamax by her PCP. She was questioning about her vitamin D level. I do not have those results available.  History of vitamin D deficiency: She is supposed to continue on vitamin D supplement.  History of hypertension: Her blood pressure was low today. She states she feels dizzy at times. Have advised her to schedule an appointment with her PCP to adjust her blood pressure medications.  Her other medical problems are listed as follows:  History of gastroesophageal reflux (GERD)  History of obesity  History of depression  History of glaucoma  History of hyperlipidemia - with target LDL less than 100  Current smoker  Type 2 diabetes mellitus with hyperglycemia, without long-term current use of insulin ([place])    Orders: No orders of the defined types were placed in this encounter.  No orders of the defined types were placed in this encounter.   Face-to-face time spent with patient was 30 minutes. Greater than50% of time was spent in counseling and coordination of care.  Follow-Up Instructions: Return in about 1 year (around 05/12/2018) for Osteoarthritis.   Bo Merino, MD  Note - This record has been created using Editor, commissioning.  Chart creation errors have been sought, but may not always  have been located. Such creation errors do not reflect on  the standard of medical care.[place]

## 2017-05-01 ENCOUNTER — Ambulatory Visit (INDEPENDENT_AMBULATORY_CARE_PROVIDER_SITE_OTHER): Payer: Medicare HMO | Admitting: *Deleted

## 2017-05-01 DIAGNOSIS — I63419 Cerebral infarction due to embolism of unspecified middle cerebral artery: Secondary | ICD-10-CM | POA: Diagnosis not present

## 2017-05-02 NOTE — Progress Notes (Signed)
Carelink Summary Report / Loop Recorder 

## 2017-05-05 LAB — CUP PACEART REMOTE DEVICE CHECK
Date Time Interrogation Session: 20181101120943
MDC IDC PG IMPLANT DT: 20181002

## 2017-05-06 ENCOUNTER — Other Ambulatory Visit: Payer: Self-pay | Admitting: Internal Medicine

## 2017-05-07 ENCOUNTER — Other Ambulatory Visit: Payer: Self-pay | Admitting: Internal Medicine

## 2017-05-12 ENCOUNTER — Telehealth: Payer: Self-pay | Admitting: Cardiovascular Disease

## 2017-05-12 ENCOUNTER — Ambulatory Visit: Payer: Commercial Managed Care - HMO | Admitting: Rheumatology

## 2017-05-12 ENCOUNTER — Encounter: Payer: Self-pay | Admitting: Rheumatology

## 2017-05-12 VITALS — BP 115/49 | HR 72 | Resp 18 | Ht 70.0 in | Wt 211.0 lb

## 2017-05-12 DIAGNOSIS — Z8639 Personal history of other endocrine, nutritional and metabolic disease: Secondary | ICD-10-CM

## 2017-05-12 DIAGNOSIS — Z8679 Personal history of other diseases of the circulatory system: Secondary | ICD-10-CM

## 2017-05-12 DIAGNOSIS — Z8719 Personal history of other diseases of the digestive system: Secondary | ICD-10-CM | POA: Diagnosis not present

## 2017-05-12 DIAGNOSIS — M19041 Primary osteoarthritis, right hand: Secondary | ICD-10-CM | POA: Diagnosis not present

## 2017-05-12 DIAGNOSIS — Z96641 Presence of right artificial hip joint: Secondary | ICD-10-CM

## 2017-05-12 DIAGNOSIS — Z8659 Personal history of other mental and behavioral disorders: Secondary | ICD-10-CM | POA: Diagnosis not present

## 2017-05-12 DIAGNOSIS — M818 Other osteoporosis without current pathological fracture: Secondary | ICD-10-CM | POA: Diagnosis not present

## 2017-05-12 DIAGNOSIS — Z96653 Presence of artificial knee joint, bilateral: Secondary | ICD-10-CM

## 2017-05-12 DIAGNOSIS — F17200 Nicotine dependence, unspecified, uncomplicated: Secondary | ICD-10-CM | POA: Diagnosis not present

## 2017-05-12 DIAGNOSIS — M1612 Unilateral primary osteoarthritis, left hip: Secondary | ICD-10-CM

## 2017-05-12 DIAGNOSIS — Z8669 Personal history of other diseases of the nervous system and sense organs: Secondary | ICD-10-CM

## 2017-05-12 DIAGNOSIS — E1165 Type 2 diabetes mellitus with hyperglycemia: Secondary | ICD-10-CM | POA: Diagnosis not present

## 2017-05-12 DIAGNOSIS — M19042 Primary osteoarthritis, left hand: Secondary | ICD-10-CM

## 2017-05-12 MED ORDER — CHLORTHALIDONE 25 MG PO TABS: 12.5000 mg | ORAL_TABLET | Freq: Every day | ORAL | 3 refills | Status: DC

## 2017-05-12 NOTE — Telephone Encounter (Signed)
BP running 115/49 - 99/60 for the last 3 weeks - denies dizziness/SOB/chest pain. Says some weakness this morning but denies any weakness prior. No recent med changes. Concerned chlorthalidone may be causing lower BP readings.

## 2017-05-12 NOTE — Telephone Encounter (Signed)
Patient's BP reading have been running lower than normal.   Would like to discuss if medication could be causing

## 2017-05-12 NOTE — Telephone Encounter (Signed)
Daughter notified and verbalized understanding.  

## 2017-05-12 NOTE — Telephone Encounter (Signed)
Can try reducing to 12.5 mg daily and monitor BP's.

## 2017-05-21 DIAGNOSIS — N3020 Other chronic cystitis without hematuria: Secondary | ICD-10-CM | POA: Diagnosis not present

## 2017-05-21 DIAGNOSIS — N200 Calculus of kidney: Secondary | ICD-10-CM | POA: Diagnosis not present

## 2017-05-26 ENCOUNTER — Ambulatory Visit: Payer: Medicare HMO | Admitting: Nurse Practitioner

## 2017-05-26 ENCOUNTER — Encounter: Payer: Self-pay | Admitting: Nurse Practitioner

## 2017-05-26 VITALS — BP 118/68 | HR 73 | Temp 97.3°F | Ht 70.0 in | Wt 213.0 lb

## 2017-05-26 DIAGNOSIS — E1165 Type 2 diabetes mellitus with hyperglycemia: Secondary | ICD-10-CM

## 2017-05-26 DIAGNOSIS — M545 Low back pain, unspecified: Secondary | ICD-10-CM

## 2017-05-26 LAB — BAYER DCA HB A1C WAIVED: HB A1C (BAYER DCA - WAIVED): 7.4 % — ABNORMAL HIGH (ref <7.0)

## 2017-05-26 MED ORDER — METHYLPREDNISOLONE ACETATE 80 MG/ML IJ SUSP: 80.0000 mg | Freq: Once | INTRAMUSCULAR | Status: DC

## 2017-05-26 MED ORDER — TRAMADOL HCL 50 MG PO TABS: 50.0000 mg | ORAL_TABLET | Freq: Two times a day (BID) | ORAL | 0 refills | Status: DC | PRN

## 2017-05-26 MED ORDER — CYCLOBENZAPRINE HCL 5 MG PO TABS
5.0000 mg | ORAL_TABLET | Freq: Three times a day (TID) | ORAL | 0 refills | Status: DC | PRN
Start: 2017-05-26 — End: 2017-10-10

## 2017-05-26 MED ORDER — METHYLPREDNISOLONE ACETATE 40 MG/ML IJ SUSP
80.0000 mg | Freq: Once | INTRAMUSCULAR | Status: AC
  Administered 2017-05-26: 80 mg via INTRAMUSCULAR

## 2017-05-26 NOTE — Patient Instructions (Signed)

## 2017-05-26 NOTE — Progress Notes (Signed)
   Subjective:    Patient ID: Caitlin Mccarthy, female    DOB: 1935-11-06, 81 y.o.   MRN: [id number]  HPI  Patient comes in today for 1 month follow up. She was seen on 04/21/17 with hgba1c of 7.3% which was increasing. She had been on metformin which she stopped taking because it caused such bad diarrhea. We started her on invokana. She says she that she is feeling better. She does not check her blood sugars because it makes her nervous. * she is c/o left flank pain-not coming from her kidney stones. She says pain is on left lower back now and it hurts to move certain ways. Rates pain 10/10 currently.  Review of Systems  Constitutional: Negative for activity change and appetite change.  HENT: Negative.   Eyes: Negative for pain.  Respiratory: Negative for shortness of breath.   Cardiovascular: Negative for chest pain, palpitations and leg swelling.  Gastrointestinal: Negative for abdominal pain.  Endocrine: Negative for polydipsia.  Genitourinary: Negative.   Skin: Negative for rash.  Neurological: Negative for dizziness, weakness and headaches.  Hematological: Does not bruise/bleed easily.  Psychiatric/Behavioral: Negative.   All other systems reviewed and are negative.      Objective:   Physical Exam  Constitutional: She is oriented to person, place, and time. She appears well-developed and well-nourished.  HENT:  Nose: Nose normal.  Mouth/Throat: Oropharynx is clear and moist.  Eyes: EOM are normal.  Neck: Trachea normal, normal range of motion and full passive range of motion without pain. Neck supple. No JVD present. Carotid bruit is not present. No thyromegaly present.  Cardiovascular: Normal rate, regular rhythm, normal heart sounds and intact distal pulses. Exam reveals no gallop and no friction rub.  No murmur heard. Pulmonary/Chest: Effort normal and breath sounds normal.  Abdominal: Soft. Bowel sounds are normal. She exhibits no distension and no mass. There is no  tenderness.  Musculoskeletal: Normal range of motion.  Pain with bearing weight on left leg. decreae ROM of lumbar spine due to pain with any movement Some increase pain on palpation (-) slr bil motor strength and sensation distally intact  Lymphadenopathy:    She has no cervical adenopathy.  Neurological: She is alert and oriented to person, place, and time. She has normal reflexes.  Skin: Skin is warm and dry.  Psychiatric: She has a normal mood and affect. Her behavior is normal. Judgment and thought content normal.   BP 118/68   Pulse 73   Temp (!) 97.3 F (36.3 C) (Oral)   Ht 5\' 10"  (1.778 m)   Wt 213 lb (96.6 kg)   BMI 30.56 kg/m   hgba1c 7.4%     Assessment & Plan:  1. Type 2 diabetes mellitus with hyperglycemia, without long-term current use of insulin (HCC) Going to continue invokana Blood sugars may go up some with depomedrol Watch carbs in diet - Bayer DCA Hb A1c Waived  2. Acute left-sided low back pain without sciatica Moist heat to left lower back - methylPREDNISolone acetate (DEPO-MEDROL) injection 80 mg - cyclobenzaprine (FLEXERIL) 5 MG tablet; Take 1 tablet (5 mg total) by mouth 3 (three) times daily as needed for muscle spasms.  Dispense: 30 tablet; Refill: 0 - traMADol (ULTRAM) 50 MG tablet; Take 1-2 tablets (50-100 mg total) by mouth every 12 (twelve) hours as needed (pain).  Dispense: 30 tablet; Refill: 0  Mary-Margaret Hassell Done, FNP

## 2017-05-28 ENCOUNTER — Ambulatory Visit: Payer: Medicare HMO

## 2017-06-02 ENCOUNTER — Other Ambulatory Visit: Payer: Self-pay | Admitting: Internal Medicine

## 2017-06-02 ENCOUNTER — Ambulatory Visit (INDEPENDENT_AMBULATORY_CARE_PROVIDER_SITE_OTHER): Payer: Medicare HMO | Admitting: *Deleted

## 2017-06-02 DIAGNOSIS — I63419 Cerebral infarction due to embolism of unspecified middle cerebral artery: Secondary | ICD-10-CM

## 2017-06-02 NOTE — Progress Notes (Signed)
Carelink Summary Report / Loop Recorder 

## 2017-06-10 LAB — CUP PACEART REMOTE DEVICE CHECK
Implantable Pulse Generator Implant Date: 20181002
MDC IDC SESS DTM: 20181201131017

## 2017-06-12 ENCOUNTER — Ambulatory Visit: Payer: Medicare HMO | Admitting: Cardiovascular Disease

## 2017-06-20 ENCOUNTER — Telehealth: Payer: Self-pay | Admitting: *Deleted

## 2017-06-20 NOTE — Telephone Encounter (Signed)
Spoke with patient, advised of Dr. Jackalyn Lombard recommendations to reprogram LINQ for false AF detections.  Plan to make AF detection less sensitive and extend episode duration to >/= 47min.  Patient is agreeable to appointment in the [facility] on 07/17/17 at 10:30am.  She is appreciative of call and denies questions or concerns at this time.

## 2017-06-23 ENCOUNTER — Other Ambulatory Visit: Payer: Self-pay | Admitting: Nurse Practitioner

## 2017-06-23 DIAGNOSIS — I10 Essential (primary) hypertension: Secondary | ICD-10-CM

## 2017-06-26 ENCOUNTER — Telehealth: Payer: Self-pay | Admitting: Internal Medicine

## 2017-06-26 DIAGNOSIS — R350 Frequency of micturition: Secondary | ICD-10-CM | POA: Diagnosis not present

## 2017-06-26 DIAGNOSIS — N3020 Other chronic cystitis without hematuria: Secondary | ICD-10-CM | POA: Diagnosis not present

## 2017-06-26 DIAGNOSIS — N200 Calculus of kidney: Secondary | ICD-10-CM | POA: Diagnosis not present

## 2017-06-26 NOTE — Telephone Encounter (Signed)
Daughter called stating that her mother fell last Friday. Family is concerned that the fall may have done something to her Device  . Please call [phone].

## 2017-06-26 NOTE — Telephone Encounter (Signed)
Nightly transmissions received. No new episodes.  Ms. Caitlin Mccarthy reports that her mother fell about 3 nights ago while she was getting up to go to the bathroom.  Brady and pause detection are programmed off d/t implant for cryptogenic stroke. I advised Caitlin Mccarthy in reinforce taking Caitlin Mccarthy's time when she is changing positions to allow her BP to adjust. She verbalizes understanding and is appreciative.

## 2017-06-26 NOTE — Telephone Encounter (Signed)
Will forward to device clinic

## 2017-06-30 ENCOUNTER — Ambulatory Visit (INDEPENDENT_AMBULATORY_CARE_PROVIDER_SITE_OTHER): Payer: Medicare HMO | Admitting: *Deleted

## 2017-06-30 DIAGNOSIS — I63419 Cerebral infarction due to embolism of unspecified middle cerebral artery: Secondary | ICD-10-CM

## 2017-06-30 NOTE — Progress Notes (Signed)
Carelink Summary Report / Loop Recorder 

## 2017-07-02 ENCOUNTER — Other Ambulatory Visit: Payer: Self-pay | Admitting: Internal Medicine

## 2017-07-07 ENCOUNTER — Ambulatory Visit (INDEPENDENT_AMBULATORY_CARE_PROVIDER_SITE_OTHER): Payer: Self-pay | Admitting: *Deleted

## 2017-07-07 DIAGNOSIS — I639 Cerebral infarction, unspecified: Secondary | ICD-10-CM

## 2017-07-07 LAB — CUP PACEART INCLINIC DEVICE CHECK
Implantable Pulse Generator Implant Date: 20181002
MDC IDC SESS DTM: 20190107115749

## 2017-07-07 NOTE — Progress Notes (Signed)
Loop check in clinic. Battery status: Good. R-waves  0.76mV. 0 symptom episodes, 0 tachy episodes, 0 pause episodes, 0 brady episodes. 6 AF episodes~ appear SR w/ PACs. Per JA recommendations AF episodes reprogrammed to >66min and AF detection less sensitive.

## 2017-07-10 LAB — CUP PACEART REMOTE DEVICE CHECK
Date Time Interrogation Session: 20181231130818
Implantable Pulse Generator Implant Date: 20181002

## 2017-07-11 ENCOUNTER — Other Ambulatory Visit: Payer: Self-pay

## 2017-07-11 ENCOUNTER — Encounter: Payer: Self-pay | Admitting: Cardiovascular Disease

## 2017-07-11 ENCOUNTER — Ambulatory Visit: Payer: Medicare HMO | Admitting: Cardiovascular Disease

## 2017-07-11 VITALS — BP 103/63 | HR 75 | Ht 70.0 in | Wt 204.8 lb

## 2017-07-11 DIAGNOSIS — E785 Hyperlipidemia, unspecified: Secondary | ICD-10-CM | POA: Diagnosis not present

## 2017-07-11 DIAGNOSIS — I63419 Cerebral infarction due to embolism of unspecified middle cerebral artery: Secondary | ICD-10-CM | POA: Diagnosis not present

## 2017-07-11 DIAGNOSIS — I499 Cardiac arrhythmia, unspecified: Secondary | ICD-10-CM

## 2017-07-11 DIAGNOSIS — I10 Essential (primary) hypertension: Secondary | ICD-10-CM | POA: Diagnosis not present

## 2017-07-11 NOTE — Patient Instructions (Signed)
Your physician recommends that you schedule a follow-up appointment in: 4 MONTHS WITH DR KONESWARAN  Your physician recommends that you continue on your current medications as directed. Please refer to the Current Medication list given to you today.  Thank you for choosing [facility]!!    

## 2017-07-11 NOTE — Progress Notes (Signed)
SUBJECTIVE: The patient presents for routine follow-up.  I previously referred her to EP for loop recorder implantation to assess for atrial fibrillation given her history of cryptogenic stroke.  She is here with her another daughter who lives in [place].  The patient has another daughter, Rojelio Brenner, and 3 grandchildren altogether.  I reviewed the most recent device interrogation performed yesterday.  There was sinus rhythm with PACs but no evidence of atrial fibrillation.  It was recommended by Dr. Rayann Heman to continue Eliquis for at least 6 months during which long-term monitoring is performed.  If she does not have atrial fibrillation detected at that time, we could consider stopping Eliquis and continue long-term monitoring.  She denies chest pain, palpitations, dizziness, shortness of breath.  She does feel colder after starting Eliquis.    Review of Systems: As per "subjective", otherwise negative.  No Known Allergies  Current Outpatient Medications  Medication Sig Dispense Refill  . acetaminophen (TYLENOL) 325 MG tablet Take 2 tablets (650 mg total) by mouth every 4 (four) hours as needed for mild pain (or temp > 37.5 C (99.5 F)). 30 tablet 1  . ALPRAZolam (XANAX) 0.25 MG tablet Take 0.25 mg by mouth as needed for anxiety.     [name] amLODipine-benazepril (LOTREL) 10-40 MG capsule TAKE ONE CAPSULE EACH MORNING 90 capsule 0  . calcium-vitamin D (OSCAL WITH D) 500-200 MG-UNIT tablet Take 1 tablet by mouth daily.    . canagliflozin (INVOKANA) 100 MG TABS tablet Take 1 tablet (100 mg total) by mouth daily before breakfast. 30 tablet 0  . cephALEXin (KEFLEX) 250 MG capsule Take 250 mg by mouth at bedtime.     . chlorthalidone (HYGROTON) 25 MG tablet Take 0.5 tablets (12.5 mg total) daily by mouth. 45 tablet 3  . cyclobenzaprine (FLEXERIL) 5 MG tablet Take 1 tablet (5 mg total) by mouth 3 (three) times daily as needed for muscle spasms. 30 tablet 0  . ELIQUIS 5 MG TABS tablet TAKE ONE  TABLET BY MOUTH TWICE DAILY (Patient taking differently: TAKE 5mg  BY MOUTH TWICE DAILY) 60 tablet 4  . fenofibrate (TRICOR) 145 MG tablet TAKE ONE (1) TABLET EACH DAY (Patient taking differently: TAKE 145mg  by mouth once daily) 90 tablet 0  . lansoprazole (PREVACID) 30 MG capsule Take 30 mg by mouth daily as needed (indigestion).     [name] PREMARIN vaginal cream Apply 1 application topically daily as needed (irritation).     . rosuvastatin (CRESTOR) 20 MG tablet TAKE ONE TABLET DAILY AT BEDTIME (Patient taking differently: TAKE 20mg  by mouth AT BEDTIME) 90 tablet 1  . traMADol (ULTRAM) 50 MG tablet Take 1-2 tablets (50-100 mg total) by mouth every 12 (twelve) hours as needed (pain). 30 tablet 0  . triamcinolone cream (KENALOG) 0.1 % Apply 1 application topically daily as needed (itching).      No current facility-administered medications for this visit.     Past Medical History:  Diagnosis Date  . Anxiety   . Depression   . GERD (gastroesophageal reflux disease)   . History of acute pyelonephritis    last episode 04-13-2015  w/ sepsis  . History of kidney stones   . History of recurrent UTIs    MULTIPLE  . Hyperlipidemia   . Hypertension   . OA (osteoarthritis)    knees, hips, hands  . Osteoporosis   . Poor memory    especially when has uti  . Renal calculus, left   . Stroke ([place])   .  Type 2 diabetes mellitus ([place])   . Vitamin D deficiency 05/10/2016    Past Surgical History:  Procedure Laterality Date  . CATARACT EXTRACTION W/PHACO  06/06/2011   Procedure: CATARACT EXTRACTION PHACO AND INTRAOCULAR LENS PLACEMENT (IOC);  Surgeon: Tonny Branch;  Location: [facility];  Service: Ophthalmology;  Laterality: Right;  CDE=12.77  . CATARACT EXTRACTION W/PHACO  06/27/2011   Procedure: CATARACT EXTRACTION PHACO AND INTRAOCULAR LENS PLACEMENT (IOC);  Surgeon: Tonny Branch;  Location: [facility];  Service: Ophthalmology;  Laterality: Left;  CDE:13.96  . CYSTOSCOPY WITH URETEROSCOPY AND STENT PLACEMENT  Right 04/27/2015   Procedure: CYSTOSCOPY WITH RIGHT URETEROSCOPY, BASKET REMOVAL OF STONE, REMOVAL OF RIGHT NEPHROSTOMY TUBE;  Surgeon: Irine Seal, MD;  Location: [facility];  Service: Urology;  Laterality: Right;  . CYSTOSCOPY/URETEROSCOPY/HOLMIUM LASER/STENT PLACEMENT Left 12/24/2016   Procedure: LEFT URETEROSCOPY WITH HOLMIUM LASER AND STENT PLACEMENT;  Surgeon: Irine Seal, MD;  Location[facility];  Service: Urology;  Laterality: Left;  . EXTRACORPOREAL SHOCK WAVE LITHOTRIPSY  left 04-01-2016;  1980's  . FRACTURE SURGERY    . HIP ARTHROPLASTY    . HOLMIUM LASER APPLICATION Right 32/20/2542   Procedure: HOLMIUM LASER APPLICATION;  Surgeon: Irine Seal, MD;  [facility];  Service: Urology;  Laterality: Right;  . KNEE ARTHROPLASTY    . KNEE ARTHROSCOPY Bilateral right 2005//  left ?  [name] LOOP RECORDER INSERTION N/A 04/01/2017   Procedure: LOOP RECORDER INSERTION;  Surgeon: Thompson Grayer, MD;  Location: [facility];  Service: Cardiovascular;  Laterality: N/A;  . TOTAL HIP ARTHROPLASTY  10/28/2011   Procedure: TOTAL HIP ARTHROPLASTY;  Surgeon: Gearlean Alf, MD;  Location: [facility];  Service: Orthopedics;  Laterality: Right;  . TOTAL KNEE ARTHROPLASTY Bilateral left 03-09-2007//  right 2006  . TRANSTHORACIC ECHOCARDIOGRAM  10/06/2006   normal echo,  ef 55-60%  . TUBAL LIGATION      Social History   Socioeconomic History  . Marital status: Married    Spouse name: Not on file  . Number of children: 2  . Years of education: 32  . Highest education level: Not on file  Social Needs  . Financial resource strain: Not on file  . Food insecurity - worry: Not on file  . Food insecurity - inability: Not on file  . Transportation needs - medical: Not on file  . Transportation needs - non-medical: Not on file  Occupational History    Comment: retired  Tobacco Use  . Smoking status: Current Every Day Smoker    Packs/day: 0.25     Years: 42.00    Pack years: 10.50    Types: Cigarettes  . Smokeless tobacco: Never Used  . Tobacco comment: 03/17/17 haven't qiut completely  Substance and Sexual Activity  . Alcohol use: No  . Drug use: No  . Sexual activity: Not on file  Other Topics Concern  . Not on file  Social History Narrative   Lives with husband     Vitals:   07/11/17 0947  BP: 103/63  Pulse: 75  Weight: 204 lb 12.8 oz (92.9 kg)  Height: 5\' 10"  (1.778 m)    Wt Readings from Last 3 Encounters:  07/11/17 204 lb 12.8 oz (92.9 kg)  05/26/17 213 lb (96.6 kg)  05/12/17 211 lb (95.7 kg)     PHYSICAL EXAM General: NAD HEENT: Normal. Neck: No JVD, no thyromegaly. Lungs: Clear to auscultation bilaterally with normal respiratory effort. CV: Regular rate and mostly regular rhythm  with premature contractions appreciated, normal S1/S2, no S3/S4, no murmur. No pretibial or periankle edema.  No carotid bruit.   Abdomen: Soft, nontender, no distention.  Neurologic: Alert and oriented.  Psych: Normal affect. Skin: Normal. Musculoskeletal: No gross deformities.    ECG: Most recent ECG reviewed.   Labs: Lab Results  Component Value Date/Time   K 4.1 03/31/2017 09:33 AM   K 3.4 (L) 08/15/2015 02:50 PM   BUN 19 03/31/2017 09:33 AM   BUN 18.8 08/15/2015 02:50 PM   CREATININE 0.81 03/31/2017 09:33 AM   CREATININE 0.9 08/15/2015 02:50 PM   ALT 32 01/16/2017 12:52 PM   ALT 27 08/15/2015 02:50 PM   HGB 12.8 01/16/2017 12:52 PM   HGB 14.5 08/15/2015 02:50 PM     Lipids: Lab Results  Component Value Date/Time   LDLCALC 77 04/21/2017 12:10 PM   LDLCALC 100 (H) 11/29/2013 12:23 PM   LDLCALC 92 10/02/2012 01:01 PM   CHOL 151 04/21/2017 12:10 PM   CHOL 158 10/02/2012 01:01 PM   TRIG 189 (H) 04/21/2017 12:10 PM   TRIG 229 (H) 11/24/2014 11:26 AM   TRIG 126 10/02/2012 01:01 PM   HDL 36 (L) 04/21/2017 12:10 PM   HDL 42 11/24/2014 11:26 AM   HDL 41 10/02/2012 01:01 PM       ASSESSMENT AND PLAN: 1.   Arrhythmia: Symptomatically stable.  She now has an implantable loop recorder with no evidence of atrial fibrillation thus far. I reviewed the most recent device interrogation performed yesterday.  There was sinus rhythm with PACs but no evidence of atrial fibrillation. It was recommended by Dr. Rayann Heman to continue Eliquis for at least 6 months during which long-term monitoring is performed.  If she does not have atrial fibrillation detected at that time, we could consider stopping Eliquis and continue long-term monitoring.  2.  Hypertension: Controlled on present therapy which includes amlodipine, benazepril, and chlorthalidone.  No changes.  3.  Left MCA CVA: See #1.  4. Hypercholesterolemia: Lipids 01/10/17 demonstrated total cholesterol 222, triglycerides 490, HDL 31, LDL 173. She is on Crestor 20 mg daily.    Disposition: Follow up 4 months   Kate Sable, M.D., F.A.C.C.

## 2017-07-16 ENCOUNTER — Other Ambulatory Visit: Payer: Self-pay | Admitting: Family

## 2017-07-16 DIAGNOSIS — E785 Hyperlipidemia, unspecified: Secondary | ICD-10-CM

## 2017-07-16 DIAGNOSIS — N200 Calculus of kidney: Secondary | ICD-10-CM | POA: Diagnosis not present

## 2017-07-16 DIAGNOSIS — R310 Gross hematuria: Secondary | ICD-10-CM | POA: Diagnosis not present

## 2017-07-28 ENCOUNTER — Other Ambulatory Visit: Payer: Self-pay | Admitting: Nurse Practitioner

## 2017-07-30 ENCOUNTER — Ambulatory Visit (INDEPENDENT_AMBULATORY_CARE_PROVIDER_SITE_OTHER): Payer: Medicare HMO | Admitting: *Deleted

## 2017-07-30 DIAGNOSIS — I63419 Cerebral infarction due to embolism of unspecified middle cerebral artery: Secondary | ICD-10-CM | POA: Diagnosis not present

## 2017-07-30 NOTE — Progress Notes (Signed)
Carelink Summary Report / Loop Recorder 

## 2017-08-12 LAB — CUP PACEART REMOTE DEVICE CHECK
Implantable Pulse Generator Implant Date: 20181002
MDC IDC SESS DTM: 20190130134055

## 2017-08-13 DIAGNOSIS — Z85828 Personal history of other malignant neoplasm of skin: Secondary | ICD-10-CM | POA: Diagnosis not present

## 2017-08-13 DIAGNOSIS — D485 Neoplasm of uncertain behavior of skin: Secondary | ICD-10-CM | POA: Diagnosis not present

## 2017-08-13 DIAGNOSIS — L570 Actinic keratosis: Secondary | ICD-10-CM | POA: Diagnosis not present

## 2017-08-13 DIAGNOSIS — C44722 Squamous cell carcinoma of skin of right lower limb, including hip: Secondary | ICD-10-CM | POA: Diagnosis not present

## 2017-09-01 ENCOUNTER — Ambulatory Visit (INDEPENDENT_AMBULATORY_CARE_PROVIDER_SITE_OTHER): Payer: Medicare HMO | Admitting: *Deleted

## 2017-09-01 DIAGNOSIS — I63419 Cerebral infarction due to embolism of unspecified middle cerebral artery: Secondary | ICD-10-CM

## 2017-09-01 NOTE — Progress Notes (Signed)
Carelink Summary Report / Loop Recorder 

## 2017-09-11 NOTE — Progress Notes (Deleted)
[facility]  PATIENT: Caitlin Mccarthy DOB: [birth year]   REASON FOR VISIT: Hospital follow-up for left MCA CVA HISTORY FROM: Patient and daughter [place] (per record) [name] a 82 y.o.femalewith a history of atrial fibrillation, diabetes mellitus, hypercoagulable state, hyperlipidemia, hypertension and smoking who presented with aphasia and slurred speech starting 7/11.  She was unable "to get [her] words out", although she knew what she wanted to say and recalls being about to understand those around her. [name] stated that these symptoms resolved sometime this morning. She denies any numbness or weakness in her extremities, dizziness, blurry or double vision.  Daughter at bedside stated that she was repeating 'uh' yesterday and not babbling. Further, she appeared confused in a manner that is typical of when she has a UTI.  Per her daughter, [name] is almost back to normal, just slightly more confused than baseline and occasionally slurring her words. Notably, she does not have a diagnosis of atrial fibrillation, nor is she on any anticoagulation, but was switching between NSR with PACs and afib on my visit. She denies chest pain and palpitations.  CT head 7/12: No acute abnormalities. Chronic small vessel disease.   MR brain pending Date last known well: Date: 01/08/2017 Time last known well: Unable to determine Modified Rankin Score: 1 Patient was not administered IV t-PA secondary to arriving outside of the treatment window. She was admitted to General Neurology for further evaluation and treatment. Interval history 09/17/2018CM Ms. Spacek, 82 year old female returns for follow-up stroke clinic after hospitalization. MRI of the brain scattered small embolic infarctions within the left MCA territory. Mild chronic small vessel ischemic change of the white matter elsewhere. CTA head and neck no  large vessel occlusion. 2-D echo EF 55-60% no source of embolus. LDL 173 Hemoglobin A1c 7.3. She is currently on eliquis for secondary stroke prevention & presumed atrial fibrillation. 30 day event monitoring without evidence of atrial fib just PACs. She is  to get a loop recorder. She denies further stroke or TIA symptoms. She has minimal bruising or bleeding. Blood pressure in the office today 148/78 she was recently placed on Chlorthalidone . She is currently receiving physical therapy and she claims is beneficial. She ambulates with single-point cane. She returns for reevaluation   REVIEW OF SYSTEMS: Full 14 system review of systems performed and notable only for those listed, all others are neg:  Constitutional: neg  Cardiovascular: neg Ear/Nose/Throat: neg  Skin: neg Eyes: neg Respiratory: neg Gastroitestinal: neg  Hematology/Lymphatic: neg  Endocrine: neg Musculoskeletal: Joint pain Allergy/Immunology: neg Neurological: neg Psychiatric: neg Sleep : neg   ALLERGIES: No Known Allergies  HOME MEDICATIONS: Outpatient Medications Prior to Visit  Medication Sig Dispense Refill  . acetaminophen (TYLENOL) 325 MG tablet Take 2 tablets (650 mg total) by mouth every 4 (four) hours as needed for mild pain (or temp > 37.5 C (99.5 F)). 30 tablet 1  . ALPRAZolam (XANAX) 0.25 MG tablet Take 0.25 mg by mouth as needed for anxiety.     [name] amLODipine-benazepril (LOTREL) 10-40 MG capsule TAKE ONE CAPSULE EACH MORNING 90 capsule 0  . calcium-vitamin D (OSCAL WITH D) 500-200 MG-UNIT tablet Take 1 tablet by mouth daily.    . canagliflozin (INVOKANA) 100 MG TABS tablet Take 1 tablet (100 mg total) by mouth daily before breakfast. 30 tablet 0  . cephALEXin (KEFLEX) 250 MG capsule Take 250 mg by mouth at bedtime.     [name]  chlorthalidone (HYGROTON) 25 MG tablet Take 0.5 tablets (12.5 mg total) daily by mouth. 45 tablet 3  . cyclobenzaprine (FLEXERIL) 5 MG tablet Take 1 tablet (5 mg total) by mouth 3 (three)  times daily as needed for muscle spasms. 30 tablet 0  . ELIQUIS 5 MG TABS tablet TAKE ONE TABLET BY MOUTH TWICE DAILY 60 tablet 4  . fenofibrate (TRICOR) 145 MG tablet TAKE ONE (1) TABLET EACH DAY (Patient taking differently: TAKE 145mg  by mouth once daily) 90 tablet 0  . lansoprazole (PREVACID) 30 MG capsule Take 30 mg by mouth daily as needed (indigestion).     [name] PREMARIN vaginal cream Apply 1 application topically daily as needed (irritation).     . rosuvastatin (CRESTOR) 20 MG tablet TAKE ONE TABLET DAILY AT BEDTIME 90 tablet 0  . traMADol (ULTRAM) 50 MG tablet Take 1-2 tablets (50-100 mg total) by mouth every 12 (twelve) hours as needed (pain). 30 tablet 0  . triamcinolone cream (KENALOG) 0.1 % Apply 1 application topically daily as needed (itching).      No facility-administered medications prior to visit.     PAST MEDICAL HISTORY: Past Medical History:  Diagnosis Date  . Anxiety   . Depression   . GERD (gastroesophageal reflux disease)   . History of acute pyelonephritis    last episode 04-13-2015  w/ sepsis  . History of kidney stones   . History of recurrent UTIs    MULTIPLE  . Hyperlipidemia   . Hypertension   . OA (osteoarthritis)    knees, hips, hands  . Osteoporosis   . Poor memory    especially when has uti  . Renal calculus, left   . Stroke ([place])   . Type 2 diabetes mellitus ([place])   . Vitamin D deficiency 05/10/2016    PAST SURGICAL HISTORY: Past Surgical History:  Procedure Laterality Date  . CATARACT EXTRACTION W/PHACO  06/06/2011   Procedure: CATARACT EXTRACTION PHACO AND INTRAOCULAR LENS PLACEMENT (IOC);  Surgeon: Tonny Branch;  Location: [facility];  Service: Ophthalmology;  Laterality: Right;  CDE=12.77  . CATARACT EXTRACTION W/PHACO  06/27/2011   Procedure: CATARACT EXTRACTION PHACO AND INTRAOCULAR LENS PLACEMENT (IOC);  Surgeon: Tonny Branch;  Location: [facility];  Service: Ophthalmology;  Laterality: Left;  CDE:13.96  . CYSTOSCOPY WITH URETEROSCOPY AND STENT  PLACEMENT Right 04/27/2015   Procedure: CYSTOSCOPY WITH RIGHT URETEROSCOPY, BASKET REMOVAL OF STONE, REMOVAL OF RIGHT NEPHROSTOMY TUBE;  Surgeon: Irine Seal, MD;  Location: [place];  Service: Urology;  Laterality: Right;  . CYSTOSCOPY/URETEROSCOPY/HOLMIUM LASER/STENT PLACEMENT Left 12/24/2016   Procedure: LEFT URETEROSCOPY WITH HOLMIUM LASER AND STENT PLACEMENT;  Surgeon: Irine Seal, MD;  [facility];  Service: Urology;  Laterality: Left;  . EXTRACORPOREAL SHOCK WAVE LITHOTRIPSY  left 04-01-2016;  1980's  . FRACTURE SURGERY    . HIP ARTHROPLASTY    . HOLMIUM LASER APPLICATION Right 98/33/8250   Procedure: HOLMIUM LASER APPLICATION;  Surgeon: Irine Seal, MD;  Location: [facility];  Service: Urology;  Laterality: Right;  . KNEE ARTHROPLASTY    . KNEE ARTHROSCOPY Bilateral right 2005//  left ?  [name] LOOP RECORDER INSERTION N/A 04/01/2017   Procedure: LOOP RECORDER INSERTION;  Surgeon: Thompson Grayer, MD;  Location: [place] [facility];  Service: Cardiovascular;  Laterality: N/A;  . TOTAL HIP ARTHROPLASTY  10/28/2011   Procedure: TOTAL HIP ARTHROPLASTY;  Surgeon: Gearlean Alf, MD;  Location: [facility];  Service: Orthopedics;  Laterality: Right;  . TOTAL KNEE ARTHROPLASTY  Bilateral left 03-09-2007//  right 2006  . TRANSTHORACIC ECHOCARDIOGRAM  10/06/2006   normal echo,  ef 55-60%  . TUBAL LIGATION      FAMILY HISTORY: Family History  Problem Relation Age of Onset  . Kidney disease Mother   . Diabetes Mother   . Congestive Heart Failure Father   . Heart failure Father   . Heart disease Brother   . Alcohol abuse Brother   . Anesthesia problems Neg Hx   . Hypotension Neg Hx   . Malignant hyperthermia Neg Hx   . Pseudochol deficiency Neg Hx     SOCIAL HISTORY: Social History   Socioeconomic History  . Marital status: Married    Spouse name: Not on file  . Number of children: 2  . Years of education: 90  . Highest education level:  Not on file  Social Needs  . Financial resource strain: Not on file  . Food insecurity - worry: Not on file  . Food insecurity - inability: Not on file  . Transportation needs - medical: Not on file  . Transportation needs - non-medical: Not on file  Occupational History    Comment: retired  Tobacco Use  . Smoking status: Current Every Day Smoker    Packs/day: 0.25    Years: 42.00    Pack years: 10.50    Types: Cigarettes  . Smokeless tobacco: Never Used  . Tobacco comment: 03/17/17 haven't qiut completely  Substance and Sexual Activity  . Alcohol use: No  . Drug use: No  . Sexual activity: Not on file  Other Topics Concern  . Not on file  Social History Narrative   Lives with husband     PHYSICAL EXAM  There were no vitals filed for this visit. There is no height or weight on file to calculate BMI.  Generalized: Well developed, Obese female in no acute distress  Head: normocephalic and atraumatic,. Oropharynx benign  Neck: Supple, no carotid bruits  Cardiac: Regular rate rhythm, no murmur  Musculoskeletal: No deformity   Neurological examination   Mentation: Alert oriented to time, place, history taking. Attention span and concentration appropriate. Recent and remote memory intact.  Follows all commands speech and language fluent.   Cranial nerve II-XII: Fundoscopic exam reveals sharp disc margins.Pupils were equal round reactive to light extraocular movements were full, visual field were full on confrontational test. Facial sensation and strength were normal. hearing was intact to finger rubbing bilaterally. Uvula tongue midline. head turning and shoulder shrug were normal and symmetric.Tongue protrusion into cheek strength was normal. Motor: normal bulk and tone, full strength in the BUE, mild weakness BLE,  Sensory: normal and symmetric to light touch, pinprick, and  Vibration, in the upper and lower extremities  Coordination: finger-nose-finger, heel-to-shin  bilaterally, no dysmetria Reflexes: 1+ upper lower and symmetric plantar responses were flexor bilaterally. Gait and Station: Rising up from seated position without assistance, normal stance,  moderate stride, good arm swing, smooth turning,  Favors left hip when walking with single-point cane  DIAGNOSTIC DATA (LABS, IMAGING, TESTING) - I reviewed patient records, labs, notes, testing and imaging myself where available.  Lab Results  Component Value Date   WBC 5.7 01/16/2017   HGB 12.8 01/16/2017   HCT 38.4 01/16/2017   MCV 91 01/16/2017   PLT 250 01/16/2017      Component Value Date/Time   NA 137 03/31/2017 0933   NA 137 08/15/2015 1450   K 4.1 03/31/2017 0933   K 3.4 (L) 08/15/2015  1450   CL 95 (L) 03/31/2017 0933   CO2 30 (H) 03/31/2017 0933   CO2 26 08/15/2015 1450   GLUCOSE 184 (H) 03/31/2017 0933   GLUCOSE 152 (H) 01/10/2017 0405   GLUCOSE 128 08/15/2015 1450   BUN 19 03/31/2017 0933   BUN 18.8 08/15/2015 1450   CREATININE 0.81 03/31/2017 0933   CREATININE 0.9 08/15/2015 1450   CALCIUM 11.0 (H) 03/31/2017 0933   CALCIUM 11.5 (H) 08/15/2015 1450   PROT 7.5 01/16/2017 1252   PROT 8.6 (H) 08/15/2015 1450   ALBUMIN 4.4 01/16/2017 1252   ALBUMIN 4.0 08/15/2015 1450   AST 41 (H) 01/16/2017 1252   AST 27 08/15/2015 1450   ALT 32 01/16/2017 1252   ALT 27 08/15/2015 1450   ALKPHOS 64 01/16/2017 1252   ALKPHOS 66 08/15/2015 1450   BILITOT 0.4 01/16/2017 1252   BILITOT 0.47 08/15/2015 1450   GFRNONAA 70 03/31/2017 0933   GFRNONAA 88 10/02/2012 1301   GFRAA 80 03/31/2017 0933   GFRAA >89 10/02/2012 1301   Lab Results  Component Value Date   CHOL 151 04/21/2017   HDL 36 (L) 04/21/2017   LDLCALC 77 04/21/2017   TRIG 189 (H) 04/21/2017   CHOLHDL 4.2 04/21/2017   Lab Results  Component Value Date   HGBA1C 7.3 (H) 01/10/2017    ASSESSMENT AND PLAN  82 y.o. year old female  here to follow-up post hospitalization for left MCA infarct. She has risk factors of  hypertension hyperlipidemia and suspected atrial fibrillation. She is to get a loop recorder. She has some mild weakness in her lower extremities and is currently receiving physical therapy.The patient is a current patient of Dr. Leonie Man  who is out of the office today . This note is sent to the work in doctor.      Stressed the importance of management of risk factors to prevent further stroke Continue Eliquis for secondary stroke prevention and suspected at [facility] strict control of hypertension with blood pressure goal below 130/90, today's [phone] continue antihypertensive medications Control of diabetes with hemoglobin A1c below 6.5 followed by primary care most recent hemoglobin A1c 7.3 Cholesterol with LDL cholesterol less than 70, followed by primary care,  continue statin drugs crestor Continue PT then HEP  eat healthy diet with whole grains,  fresh fruits and vegetables F/U in 6 months Discussed risk for recurrent stroke/ TIA and answered additional questions This was a visit requiring 30 minutes of  medical decision making of high complexity with extensive review of history, hospital chart, counseling and answering questions Dennie Bible, [facility], [facility], APRN  [facility] [address], [place], [place] [address] [phone]

## 2017-09-15 ENCOUNTER — Ambulatory Visit: Payer: Medicare HMO | Admitting: Nurse Practitioner

## 2017-09-15 ENCOUNTER — Telehealth: Payer: Self-pay | Admitting: *Deleted

## 2017-09-15 NOTE — Telephone Encounter (Signed)
Patient called today and cancelled follow up due to illness.  She rescheduled.

## 2017-09-16 ENCOUNTER — Encounter: Payer: Self-pay | Admitting: Nurse Practitioner

## 2017-09-24 ENCOUNTER — Other Ambulatory Visit: Payer: Self-pay | Admitting: Nurse Practitioner

## 2017-09-24 DIAGNOSIS — I10 Essential (primary) hypertension: Secondary | ICD-10-CM

## 2017-10-06 ENCOUNTER — Ambulatory Visit (INDEPENDENT_AMBULATORY_CARE_PROVIDER_SITE_OTHER): Payer: Medicare HMO | Admitting: *Deleted

## 2017-10-06 DIAGNOSIS — I63419 Cerebral infarction due to embolism of unspecified middle cerebral artery: Secondary | ICD-10-CM | POA: Diagnosis not present

## 2017-10-06 DIAGNOSIS — N200 Calculus of kidney: Secondary | ICD-10-CM | POA: Diagnosis not present

## 2017-10-06 DIAGNOSIS — N3020 Other chronic cystitis without hematuria: Secondary | ICD-10-CM | POA: Diagnosis not present

## 2017-10-07 NOTE — Progress Notes (Signed)
Carelink Summary Report / Loop Recorder 

## 2017-10-10 ENCOUNTER — Ambulatory Visit (INDEPENDENT_AMBULATORY_CARE_PROVIDER_SITE_OTHER): Payer: Medicare HMO | Admitting: Nurse Practitioner

## 2017-10-10 ENCOUNTER — Encounter: Payer: Self-pay | Admitting: Nurse Practitioner

## 2017-10-10 VITALS — BP 129/60 | HR 82 | Temp 97.0°F | Ht 70.0 in | Wt 205.0 lb

## 2017-10-10 DIAGNOSIS — I6930 Unspecified sequelae of cerebral infarction: Secondary | ICD-10-CM

## 2017-10-10 DIAGNOSIS — E781 Pure hyperglyceridemia: Secondary | ICD-10-CM | POA: Diagnosis not present

## 2017-10-10 DIAGNOSIS — I10 Essential (primary) hypertension: Secondary | ICD-10-CM

## 2017-10-10 DIAGNOSIS — M545 Low back pain, unspecified: Secondary | ICD-10-CM

## 2017-10-10 DIAGNOSIS — F17200 Nicotine dependence, unspecified, uncomplicated: Secondary | ICD-10-CM

## 2017-10-10 DIAGNOSIS — E1165 Type 2 diabetes mellitus with hyperglycemia: Secondary | ICD-10-CM | POA: Diagnosis not present

## 2017-10-10 DIAGNOSIS — E559 Vitamin D deficiency, unspecified: Secondary | ICD-10-CM | POA: Diagnosis not present

## 2017-10-10 DIAGNOSIS — M818 Other osteoporosis without current pathological fracture: Secondary | ICD-10-CM

## 2017-10-10 DIAGNOSIS — F411 Generalized anxiety disorder: Secondary | ICD-10-CM | POA: Diagnosis not present

## 2017-10-10 DIAGNOSIS — E663 Overweight: Secondary | ICD-10-CM | POA: Diagnosis not present

## 2017-10-10 DIAGNOSIS — F339 Major depressive disorder, recurrent, unspecified: Secondary | ICD-10-CM | POA: Diagnosis not present

## 2017-10-10 DIAGNOSIS — G8929 Other chronic pain: Secondary | ICD-10-CM

## 2017-10-10 DIAGNOSIS — M1612 Unilateral primary osteoarthritis, left hip: Secondary | ICD-10-CM

## 2017-10-10 DIAGNOSIS — K219 Gastro-esophageal reflux disease without esophagitis: Secondary | ICD-10-CM

## 2017-10-10 LAB — BAYER DCA HB A1C WAIVED: HB A1C (BAYER DCA - WAIVED): 7.9 % — ABNORMAL HIGH (ref <7.0)

## 2017-10-10 MED ORDER — METFORMIN HCL 1000 MG PO TABS: 1000.0000 mg | ORAL_TABLET | Freq: Two times a day (BID) | ORAL | 1 refills | Status: DC

## 2017-10-10 MED ORDER — SERTRALINE HCL 50 MG PO TABS: 50.0000 mg | ORAL_TABLET | Freq: Every day | ORAL | 5 refills | Status: DC

## 2017-10-10 MED ORDER — CANAGLIFLOZIN 100 MG PO TABS: 100.0000 mg | ORAL_TABLET | Freq: Every day | ORAL | 5 refills | Status: DC

## 2017-10-10 MED ORDER — TRAMADOL HCL 50 MG PO TABS: 50.0000 mg | ORAL_TABLET | Freq: Two times a day (BID) | ORAL | 0 refills | Status: DC | PRN

## 2017-10-10 MED ORDER — ROSUVASTATIN CALCIUM 20 MG PO TABS: 20.0000 mg | ORAL_TABLET | Freq: Every day | ORAL | 1 refills | Status: DC

## 2017-10-10 MED ORDER — APIXABAN 5 MG PO TABS: 5.0000 mg | ORAL_TABLET | Freq: Two times a day (BID) | ORAL | 5 refills | Status: DC

## 2017-10-10 MED ORDER — AMLODIPINE BESY-BENAZEPRIL HCL 10-40 MG PO CAPS: ORAL_CAPSULE | ORAL | 1 refills | Status: DC

## 2017-10-10 NOTE — Patient Instructions (Signed)

## 2017-10-10 NOTE — Progress Notes (Signed)
Subjective:    Patient ID: Caitlin Mccarthy, female    DOB: 26-Apr-1936, 82 y.o.   MRN: [id number]  HPI   Caitlin Mccarthy is here today for follow up of chronic medical problem.  Outpatient Encounter Medications as of 10/10/2017  Medication Sig  . acetaminophen (TYLENOL) 325 MG tablet Take 2 tablets (650 mg total) by mouth every 4 (four) hours as needed for mild pain (or temp > 37.5 C (99.5 F)).  [name] ALPRAZolam (XANAX) 0.25 MG tablet Take 0.25 mg by mouth as needed for anxiety.   [name] amLODipine-benazepril (LOTREL) 10-40 MG capsule TAKE ONE CAPSULE EACH MORNING  . calcium-vitamin D (OSCAL WITH D) 500-200 MG-UNIT tablet Take 1 tablet by mouth daily.  . canagliflozin (INVOKANA) 100 MG TABS tablet Take 1 tablet (100 mg total) by mouth daily before breakfast.  . cephALEXin (KEFLEX) 250 MG capsule Take 250 mg by mouth at bedtime.   . cyclobenzaprine (FLEXERIL) 5 MG tablet Take 1 tablet (5 mg total) by mouth 3 (three) times daily as needed for muscle spasms.  [name] ELIQUIS 5 MG TABS tablet TAKE ONE TABLET BY MOUTH TWICE DAILY  . fenofibrate (TRICOR) 145 MG tablet TAKE ONE (1) TABLET EACH DAY (Patient taking differently: TAKE 17m by mouth once daily)  . lansoprazole (PREVACID) 30 MG capsule Take 30 mg by mouth daily as needed (indigestion).   .Marland KitchenPREMARIN vaginal cream Apply 1 application topically daily as needed (irritation).   . rosuvastatin (CRESTOR) 20 MG tablet TAKE ONE TABLET DAILY AT BEDTIME  . traMADol (ULTRAM) 50 MG tablet Take 1-2 tablets (50-100 mg total) by mouth every 12 (twelve) hours as needed (pain).  . triamcinolone cream (KENALOG) 0.1 % Apply 1 application topically daily as needed (itching).   . chlorthalidone (HYGROTON) 25 MG tablet Take 0.5 tablets (12.5 mg total) daily by mouth.     1. Essential hypertension, benign  No c/o chest pain, sob or headache. Does not check blood pressure at home. BP Readings from Last 3 Encounters:  07/11/17 103/63  05/26/17 118/68  05/12/17 (!)  115/49     2. Late effect of cerebrovascular accident (CVA)  Has some slight left sided weakness and uses a cane to walk.  3. Gastroesophageal reflux disease without esophagitis  Takes prevacid daily- works well to keep symptoms under control  4. Type 2 diabetes mellitus with hyperglycemia, without long-term current use of insulin (HCC) last HGBA1C was 7.3%. She does not check her blood sugars everyday. When she does check them fating they are below 150. No hypoglycemia  5. Other osteoporosis without current pathological fracture  Last Dexa scan was 01/31/16 with t score of -2.6. She does very little weight bearng exercise.  6. Primary osteoarthritis of left hip  Had a right hip replacement and needs left hip replacement  7. Vitamin D deficiency  Does not remember to take vitamin d daily  8. Overweight (BMI 25.0-29.9)  No recent weight changes  9. Pure hyperglyceridemia  Does not watch diet at all  10. GAD (generalized anxiety disorder)  Stays stressed- not taking any meds for this  11. Episode of recurrent major depressive disorder, unspecified depression episode severity ([place] patient not currently on anything. She says she has been very depressed. She has triied lexapro in the past and it did not help.  12. Current smoker  due to tress she says she is not able to stop smoking  13. Other chronic pain  She takes ultram daily    New  complaints: None other then worsening depression Depression screen [facility] 2/9 10/10/2017 05/26/2017 04/21/2017  Decreased Interest 3 0 2  Down, Depressed, Hopeless 3 0 2  PHQ - 2 Score 6 0 4  Altered sleeping 2 - 2  Tired, decreased energy 3 - 3  Change in appetite 1 - 0  Feeling bad or failure about yourself  2 - 0  Trouble concentrating 2 - 2  Moving slowly or fidgety/restless 2 - 0  Suicidal thoughts 0 - 0  PHQ-9 Score 18 - 11  Difficult doing work/chores Very difficult - -  Some recent data might be hidden     Social history: Lives with  husband- not able to get out of house like she would like to. Has a brother with pancreatic cancer  Review of Systems  Constitutional: Negative for activity change and appetite change.  HENT: Negative.   Eyes: Negative for pain.  Respiratory: Negative for shortness of breath.   Cardiovascular: Negative for chest pain, palpitations and leg swelling.  Gastrointestinal: Negative for abdominal pain.  Endocrine: Negative for polydipsia.  Genitourinary: Negative.   Musculoskeletal: Positive for arthralgias (right thigh pain).  Skin: Negative for rash.  Neurological: Negative for dizziness, weakness and headaches.  Hematological: Does not bruise/bleed easily.  Psychiatric/Behavioral: Positive for sleep disturbance. Negative for suicidal ideas. The patient is nervous/anxious.   All other systems reviewed and are negative.      Objective:   Physical Exam  Constitutional: She is oriented to person, place, and time. She appears well-developed and well-nourished.  HENT:  Nose: Nose normal.  Mouth/Throat: Oropharynx is clear and moist.  Eyes: EOM are normal.  Neck: Trachea normal, normal range of motion and full passive range of motion without pain. Neck supple. No JVD present. Carotid bruit is not present. No thyromegaly present.  Cardiovascular: Normal rate, regular rhythm, normal heart sounds and intact distal pulses. Exam reveals no gallop and no friction rub.  No murmur heard. Pulmonary/Chest: Effort normal and breath sounds normal.  Abdominal: Soft. Bowel sounds are normal. She exhibits no distension and no mass. There is no tenderness.  Musculoskeletal: Normal range of motion.  Pain right thigh with walking. Gait unsteady-uses cane on right Unable to get on exam table.  Lymphadenopathy:    She has no cervical adenopathy.  Neurological: She is alert and oriented to person, place, and time. She has normal reflexes.  Skin: Skin is warm and dry.  Psychiatric: She has a normal mood and  affect. Her behavior is normal. Judgment and thought content normal.   BP 129/60   Pulse 82   Temp (!) 97 F (36.1 C) (Oral)   Ht '5\' 10"'  (1.778 m)   Wt 205 lb (93 kg)   BMI 29.41 kg/m   hgba1c 7.9%    Assessment & Plan:  1. Essential hypertension, benign Low sodium diet - CMP14+EGFR - amLODipine-benazepril (LOTREL) 10-40 MG capsule; TAKE ONE CAPSULE EACH MORNING  Dispense: 90 capsule; Refill: 1  2. Late effect of cerebrovascular accident (CVA) Fall precautions - apixaban (ELIQUIS) 5 MG TABS tablet; Take 1 tablet (5 mg total) by mouth 2 (two) times daily.  Dispense: 60 tablet; Refill: 5  3. Gastroesophageal reflux disease without esophagitis Avoid spicy foods Do not eat 2 hours prior to bedtime  4. Type 2 diabetes mellitus with hyperglycemia, without long-term current use of insulin (HCC) Stricter carb counting Wants to go back on metformin- rx sent in and cancelled invokana - Bayer DCA Hb A1c Waived - Lipid  panel - canagliflozin (INVOKANA) 100 MG TABS tablet; Take 1 tablet (100 mg total) by mouth daily before breakfast.  Dispense: 30 tablet; Refill: 5  5. Other osteoporosis without current pathological fracture cannnot tolerate weight bearing exercise  6. Primary osteoarthritis of left hip This is currently not bothering hr  7. Vitamin D deficiency Needs to get back on vitamind supplements  8. Overweight (BMI 25.0-29.9) Discussed diet and exercise for person with BMI >25 Will recheck weight in 3-6 months  9. Pure hyperglyceridemia Low fat diet - rosuvastatin (CRESTOR) 20 MG tablet; Take 1 tablet (20 mg total) by mouth at bedtime.  Dispense: 90 tablet; Refill: 1  10. GAD (generalized anxiety disorder) Stress management  11. Episode of recurrent major depressive disorder, unspecified depression episode severity ([place]) Started on zoloft- patient will let me know how she is doing - sertraline (ZOLOFT) 50 MG tablet; Take 1 tablet (50 mg total) by mouth daily.   Dispense: 30 tablet; Refill: 5  12. Current smoker Encouraged to stop smoking  13. Other chronic pain hse is goiing to follow up with ortho to see why right thigh has started hurting - traMADol (ULTRAM) 50 MG tablet; Take 1-2 tablets (50-100 mg total) by mouth every 12 (twelve) hours as needed (pain).  Dispense: 60 tablet; Refill: 0    Labs pending Health maintenance reviewed Diet and exercise encouraged Continue all meds Follow up  In 3 month   [place], FNP s

## 2017-10-11 LAB — CMP14+EGFR
ALBUMIN: 4.3 g/dL (ref 3.5–4.8)
ALK PHOS: 57 IU/L (ref 39–117)
ALT: 31 IU/L (ref 0–32)
AST: 43 IU/L — AB (ref 0–40)
Albumin/Globulin Ratio: 1.3 (ref 1.2–2.2)
BUN/Creatinine Ratio: 21 (ref 12–28)
BUN: 20 mg/dL (ref 8–27)
CHLORIDE: 99 mmol/L (ref 96–106)
CO2: 23 mmol/L (ref 20–29)
Calcium: 11.3 mg/dL — ABNORMAL HIGH (ref 8.7–10.3)
Creatinine, Ser: 0.96 mg/dL (ref 0.57–1.00)
GFR calc Af Amer: 65 mL/min/{1.73_m2} (ref >59)
GFR calc non Af Amer: 56 mL/min/{1.73_m2} — ABNORMAL LOW (ref >59)
GLOBULIN, TOTAL: 3.3 g/dL (ref 1.5–4.5)
GLUCOSE: 171 mg/dL — AB (ref 65–99)
Potassium: 5.1 mmol/L (ref 3.5–5.2)
SODIUM: 138 mmol/L (ref 134–144)
Total Protein: 7.6 g/dL (ref 6.0–8.5)

## 2017-10-11 LAB — LIPID PANEL
CHOLESTEROL TOTAL: 226 mg/dL — AB (ref 100–199)
Chol/HDL Ratio: 6.6 ratio — ABNORMAL HIGH (ref 0.0–4.4)
HDL: 34 mg/dL — ABNORMAL LOW (ref >39)
LDL Calculated: 133 mg/dL — ABNORMAL HIGH (ref 0–99)
TRIGLYCERIDES: 296 mg/dL — AB (ref 0–149)
VLDL Cholesterol Cal: 59 mg/dL — ABNORMAL HIGH (ref 5–40)

## 2017-10-14 ENCOUNTER — Encounter: Payer: Self-pay | Admitting: Cardiovascular Disease

## 2017-10-14 ENCOUNTER — Ambulatory Visit: Payer: Medicare HMO | Admitting: Cardiovascular Disease

## 2017-10-14 VITALS — BP 120/58 | HR 80 | Ht 70.0 in | Wt 214.0 lb

## 2017-10-14 DIAGNOSIS — I499 Cardiac arrhythmia, unspecified: Secondary | ICD-10-CM | POA: Diagnosis not present

## 2017-10-14 DIAGNOSIS — E785 Hyperlipidemia, unspecified: Secondary | ICD-10-CM

## 2017-10-14 DIAGNOSIS — I10 Essential (primary) hypertension: Secondary | ICD-10-CM

## 2017-10-14 DIAGNOSIS — I63419 Cerebral infarction due to embolism of unspecified middle cerebral artery: Secondary | ICD-10-CM | POA: Diagnosis not present

## 2017-10-14 LAB — CUP PACEART REMOTE DEVICE CHECK
Date Time Interrogation Session: 20190304174110
Implantable Pulse Generator Implant Date: 20181002
MDC IDC PG IMPLANT DT: 20181002
MDC IDC SESS DTM: 20190406183905

## 2017-10-14 NOTE — Patient Instructions (Signed)

## 2017-10-14 NOTE — Progress Notes (Signed)
SUBJECTIVE: The patient presents for routine follow-up.  I reviewed her most recent device interrogation performed on 07/30/17 which showed no evidence of arrhythmias with a normal histogram.  Device function was also normal.  Specifically, there was no evidence of atrial fibrillation.  It was recommended by Dr. Rayann Heman to continue Eliquis for at least 6 months during which long-term monitoring is performed.  If she does not have atrial fibrillation detected at that time, we could consider stopping Eliquis and continue long-term monitoring.  I reviewed lipids performed on 10/10/17 which showed total cholesterol 226, triglycerides 296, HDL 34, LDL 133.  The patient denies any symptoms of chest pain, palpitations, shortness of breath, lightheadedness, dizziness, leg swelling, orthopnea, PND, and syncope.  ECG performed in the office today which I ordered and personally interpreted demonstrates normal sinus rhythm with no ischemic ST segment or T-wave abnormalities, nor any arrhythmias.    Review of Systems: As per "subjective", otherwise negative.  No Known Allergies  Current Outpatient Medications  Medication Sig Dispense Refill  . amLODipine-benazepril (LOTREL) 10-40 MG capsule TAKE ONE CAPSULE EACH MORNING 90 capsule 1  . apixaban (ELIQUIS) 5 MG TABS tablet Take 1 tablet (5 mg total) by mouth 2 (two) times daily. 60 tablet 5  . calcium-vitamin D (OSCAL WITH D) 500-200 MG-UNIT tablet Take 1 tablet by mouth daily.    . canagliflozin (INVOKANA) 100 MG TABS tablet Take 1 tablet (100 mg total) by mouth daily before breakfast. 30 tablet 5  . metFORMIN (GLUCOPHAGE) 1000 MG tablet Take 1 tablet (1,000 mg total) by mouth 2 (two) times daily with a meal. 180 tablet 1  . PREMARIN vaginal cream Apply 1 application topically daily as needed (irritation).     . rosuvastatin (CRESTOR) 20 MG tablet Take 1 tablet (20 mg total) by mouth at bedtime. 90 tablet 1  . sertraline (ZOLOFT) 50 MG tablet Take  1 tablet (50 mg total) by mouth daily. 30 tablet 5  . traMADol (ULTRAM) 50 MG tablet Take 1-2 tablets (50-100 mg total) by mouth every 12 (twelve) hours as needed (pain). 60 tablet 0  . UNKNOWN TO PATIENT antibiotic for UTI     No current facility-administered medications for this visit.     Past Medical History:  Diagnosis Date  . Anxiety   . Depression   . GERD (gastroesophageal reflux disease)   . History of acute pyelonephritis    last episode 04-13-2015  w/ sepsis  . History of kidney stones   . History of recurrent UTIs    MULTIPLE  . Hyperlipidemia   . Hypertension   . OA (osteoarthritis)    knees, hips, hands  . Osteoporosis   . Poor memory    especially when has uti  . Renal calculus, left   . Stroke ([place])   . Type 2 diabetes mellitus ([place])   . Vitamin D deficiency 05/10/2016    Past Surgical History:  Procedure Laterality Date  . CATARACT EXTRACTION W/PHACO  06/06/2011   Procedure: CATARACT EXTRACTION PHACO AND INTRAOCULAR LENS PLACEMENT (IOC);  Surgeon: Tonny Branch;  Location: [facility];  Service: Ophthalmology;  Laterality: Right;  CDE=12.77  . CATARACT EXTRACTION W/PHACO  06/27/2011   Procedure: CATARACT EXTRACTION PHACO AND INTRAOCULAR LENS PLACEMENT (IOC);  Surgeon: Tonny Branch;  Location: [facility];  Service: Ophthalmology;  Laterality: Left;  CDE:13.96  . CYSTOSCOPY WITH URETEROSCOPY AND STENT PLACEMENT Right 04/27/2015   Procedure: CYSTOSCOPY WITH RIGHT URETEROSCOPY, BASKET REMOVAL OF STONE, REMOVAL OF RIGHT  NEPHROSTOMY TUBE;  Surgeon: Irine Seal, MD;  Location: [facility];  Service: Urology;  Laterality: Right;  . CYSTOSCOPY/URETEROSCOPY/HOLMIUM LASER/STENT PLACEMENT Left 12/24/2016   Procedure: LEFT URETEROSCOPY WITH HOLMIUM LASER AND STENT PLACEMENT;  Surgeon: Irine Seal, MD;  Location: [facility];  Service: Urology;  Laterality: Left;  . EXTRACORPOREAL SHOCK WAVE LITHOTRIPSY  left 04-01-2016;  1980's  . FRACTURE SURGERY    . HIP  ARTHROPLASTY    . HOLMIUM LASER APPLICATION Right 16/04/9603   Procedure: HOLMIUM LASER APPLICATION;  Surgeon: Irine Seal, MD;  Location: [facility];  Service: Urology;  Laterality: Right;  . KNEE ARTHROPLASTY    . KNEE ARTHROSCOPY Bilateral right 2005//  left ?  [name] LOOP RECORDER INSERTION N/A 04/01/2017   Procedure: LOOP RECORDER INSERTION;  Surgeon: Thompson Grayer, MD;  Location: [facility];  Service: Cardiovascular;  Laterality: N/A;  . TOTAL HIP ARTHROPLASTY  10/28/2011   Procedure: TOTAL HIP ARTHROPLASTY;  Surgeon: Gearlean Alf, MD;  Location: [facility];  Service: Orthopedics;  Laterality: Right;  . TOTAL KNEE ARTHROPLASTY Bilateral left 03-09-2007//  right 2006  . TRANSTHORACIC ECHOCARDIOGRAM  10/06/2006   normal echo,  ef 55-60%  . TUBAL LIGATION      Social History   Socioeconomic History  . Marital status: Married    Spouse name: Not on file  . Number of children: 2  . Years of education: 57  . Highest education level: Not on file  Occupational History    Comment: retired  Scientific laboratory technician  . Financial resource strain: Not on file  . Food insecurity:    Worry: Not on file    Inability: Not on file  . Transportation needs:    Medical: Not on file    Non-medical: Not on file  Tobacco Use  . Smoking status: Current Every Day Smoker    Packs/day: 0.25    Years: 42.00    Pack years: 10.50    Types: Cigarettes  . Smokeless tobacco: Never Used  . Tobacco comment: 03/17/17 haven't qiut completely  Substance and Sexual Activity  . Alcohol use: No  . Drug use: No  . Sexual activity: Not on file  Lifestyle  . Physical activity:    Days per week: Not on file    Minutes per session: Not on file  . Stress: Not on file  Relationships  . Social connections:    Talks on phone: Not on file    Gets together: Not on file    Attends religious service: Not on file    Active member of club or organization: Not on file    Attends meetings of clubs or  organizations: Not on file    Relationship status: Not on file  . Intimate partner violence:    Fear of current or ex partner: Not on file    Emotionally abused: Not on file    Physically abused: Not on file    Forced sexual activity: Not on file  Other Topics Concern  . Not on file  Social History Narrative   Lives with husband     Vitals:   10/14/17 1023  BP: (!) 120/58  Pulse: 80  SpO2: 96%  Weight: 214 lb (97.1 kg)  Height: 5\' 10"  (1.778 m)    Wt Readings from Last 3 Encounters:  10/14/17 214 lb (97.1 kg)  10/10/17 205 lb (93 kg)  07/11/17 204 lb 12.8 oz (92.9 kg)     PHYSICAL EXAM General:  NAD HEENT: Normal. Neck: No JVD, no thyromegaly. Lungs: Clear to auscultation bilaterally with normal respiratory effort. CV: Regular rate and rhythm, normal S1/S2, no S3/S4, no murmur. No pretibial or periankle edema.   Abdomen: Soft, nontender, no distention.  Neurologic: Alert and oriented.  Psych: Normal affect. Skin: Normal. Musculoskeletal: No gross deformities.    ECG: Most recent ECG reviewed.   Labs: Lab Results  Component Value Date/Time   K 5.1 10/10/2017 03:06 PM   K 3.4 (L) 08/15/2015 02:50 PM   BUN 20 10/10/2017 03:06 PM   BUN 18.8 08/15/2015 02:50 PM   CREATININE 0.96 10/10/2017 03:06 PM   CREATININE 0.9 08/15/2015 02:50 PM   ALT 31 10/10/2017 03:06 PM   ALT 27 08/15/2015 02:50 PM   HGB 12.8 01/16/2017 12:52 PM   HGB 14.5 08/15/2015 02:50 PM     Lipids: Lab Results  Component Value Date/Time   LDLCALC 133 (H) 10/10/2017 03:06 PM   LDLCALC 100 (H) 11/29/2013 12:23 PM   LDLCALC 92 10/02/2012 01:01 PM   CHOL 226 (H) 10/10/2017 03:06 PM   CHOL 158 10/02/2012 01:01 PM   TRIG 296 (H) 10/10/2017 03:06 PM   TRIG 229 (H) 11/24/2014 11:26 AM   TRIG 126 10/02/2012 01:01 PM   HDL 34 (L) 10/10/2017 03:06 PM   HDL 42 11/24/2014 11:26 AM   HDL 41 10/02/2012 01:01 PM       ASSESSMENT AND PLAN:  1.  Arrhythmia: Symptomatically stable.  Most recent  device interrogation from January 2019 reviewed above.  There has been no evidence of atrial fibrillation thus far. It was recommended by Dr. Rayann Heman to continue Eliquis for at least 6 months during which long-term monitoring is performed.    He commented that if she does not have atrial fibrillation detected at that time, we could consider stopping Eliquis and continue long-term monitoring.  As she is doing well and denies any bleeding problems, I will continue Eliquis.  Her next device interrogation is on May 9 and this issue could be revisited at that time.  2.  Hypertension: Controlled on present therapy which includes amlodipine and benazepril.  She is no longer on chlorthalidone.  No changes.  3.  Left MCA CVA: See #1.  I would also recommend increasing Crestor to 40 mg.  See discussion of lipids and #4.  4. Dyslipidemia: Lipids from April 2019 reviewed above. She is on Crestor 20mg  daily.  Given her history of stroke, I would recommend increasing Crestor to 40 mg.     Disposition: Follow up 6 months   Kate Sable, M.D., F.A.C.C.

## 2017-10-23 ENCOUNTER — Ambulatory Visit: Payer: Medicare HMO | Admitting: Nurse Practitioner

## 2017-10-27 ENCOUNTER — Ambulatory Visit: Payer: Medicare HMO | Admitting: Nurse Practitioner

## 2017-10-27 DIAGNOSIS — N3020 Other chronic cystitis without hematuria: Secondary | ICD-10-CM | POA: Diagnosis not present

## 2017-10-27 DIAGNOSIS — R310 Gross hematuria: Secondary | ICD-10-CM | POA: Diagnosis not present

## 2017-10-27 DIAGNOSIS — N200 Calculus of kidney: Secondary | ICD-10-CM | POA: Diagnosis not present

## 2017-11-06 ENCOUNTER — Ambulatory Visit (INDEPENDENT_AMBULATORY_CARE_PROVIDER_SITE_OTHER): Payer: Medicare HMO | Admitting: *Deleted

## 2017-11-06 DIAGNOSIS — I63419 Cerebral infarction due to embolism of unspecified middle cerebral artery: Secondary | ICD-10-CM

## 2017-11-08 ENCOUNTER — Other Ambulatory Visit: Payer: Self-pay | Admitting: Nurse Practitioner

## 2017-11-08 DIAGNOSIS — M545 Low back pain, unspecified: Secondary | ICD-10-CM

## 2017-11-10 NOTE — Progress Notes (Signed)
Carelink Summary Report / Loop Recorder 

## 2017-12-01 LAB — CUP PACEART REMOTE DEVICE CHECK
Date Time Interrogation Session: 20190509201018
MDC IDC PG IMPLANT DT: 20181002

## 2017-12-09 ENCOUNTER — Ambulatory Visit (INDEPENDENT_AMBULATORY_CARE_PROVIDER_SITE_OTHER): Payer: Medicare HMO | Admitting: *Deleted

## 2017-12-09 DIAGNOSIS — I63419 Cerebral infarction due to embolism of unspecified middle cerebral artery: Secondary | ICD-10-CM | POA: Diagnosis not present

## 2017-12-10 NOTE — Progress Notes (Signed)
Carelink Summary Report / Loop Recorder 

## 2017-12-16 DIAGNOSIS — Z96641 Presence of right artificial hip joint: Secondary | ICD-10-CM | POA: Insufficient documentation

## 2017-12-19 DIAGNOSIS — M170 Bilateral primary osteoarthritis of knee: Secondary | ICD-10-CM | POA: Diagnosis not present

## 2018-01-12 ENCOUNTER — Ambulatory Visit (INDEPENDENT_AMBULATORY_CARE_PROVIDER_SITE_OTHER): Payer: Medicare HMO | Admitting: *Deleted

## 2018-01-12 DIAGNOSIS — R8279 Other abnormal findings on microbiological examination of urine: Secondary | ICD-10-CM | POA: Diagnosis not present

## 2018-01-12 DIAGNOSIS — N3020 Other chronic cystitis without hematuria: Secondary | ICD-10-CM | POA: Diagnosis not present

## 2018-01-12 DIAGNOSIS — I63419 Cerebral infarction due to embolism of unspecified middle cerebral artery: Secondary | ICD-10-CM

## 2018-01-12 NOTE — Progress Notes (Signed)
Carelink Summary Report / Loop Recorder 

## 2018-01-15 LAB — CUP PACEART REMOTE DEVICE CHECK
Date Time Interrogation Session: 20190611203909
Implantable Pulse Generator Implant Date: 20181002

## 2018-01-23 ENCOUNTER — Encounter: Payer: Self-pay | Admitting: Family

## 2018-01-23 ENCOUNTER — Ambulatory Visit (INDEPENDENT_AMBULATORY_CARE_PROVIDER_SITE_OTHER): Payer: Medicare HMO | Admitting: Family

## 2018-01-23 VITALS — BP 121/63 | HR 77 | Temp 97.0°F | Ht 70.0 in | Wt 212.0 lb

## 2018-01-23 DIAGNOSIS — E785 Hyperlipidemia, unspecified: Secondary | ICD-10-CM

## 2018-01-23 DIAGNOSIS — R300 Dysuria: Secondary | ICD-10-CM

## 2018-01-23 DIAGNOSIS — F17200 Nicotine dependence, unspecified, uncomplicated: Secondary | ICD-10-CM | POA: Diagnosis not present

## 2018-01-23 DIAGNOSIS — M545 Low back pain, unspecified: Secondary | ICD-10-CM

## 2018-01-23 DIAGNOSIS — N3001 Acute cystitis with hematuria: Secondary | ICD-10-CM | POA: Diagnosis not present

## 2018-01-23 DIAGNOSIS — F411 Generalized anxiety disorder: Secondary | ICD-10-CM

## 2018-01-23 DIAGNOSIS — M1612 Unilateral primary osteoarthritis, left hip: Secondary | ICD-10-CM

## 2018-01-23 DIAGNOSIS — F339 Major depressive disorder, recurrent, unspecified: Secondary | ICD-10-CM

## 2018-01-23 DIAGNOSIS — I10 Essential (primary) hypertension: Secondary | ICD-10-CM

## 2018-01-23 DIAGNOSIS — G8929 Other chronic pain: Secondary | ICD-10-CM | POA: Diagnosis not present

## 2018-01-23 DIAGNOSIS — E1165 Type 2 diabetes mellitus with hyperglycemia: Secondary | ICD-10-CM | POA: Diagnosis not present

## 2018-01-23 DIAGNOSIS — E781 Pure hyperglyceridemia: Secondary | ICD-10-CM

## 2018-01-23 DIAGNOSIS — E559 Vitamin D deficiency, unspecified: Secondary | ICD-10-CM

## 2018-01-23 LAB — URINALYSIS, COMPLETE
BILIRUBIN UA: NEGATIVE
KETONES UA: NEGATIVE
Nitrite, UA: POSITIVE — AB
PH UA: 5.5 (ref 5.0–7.5)
PROTEIN UA: NEGATIVE
SPEC GRAV UA: 1.01 (ref 1.005–1.030)
Urobilinogen, Ur: 0.2 mg/dL (ref 0.2–1.0)

## 2018-01-23 LAB — MICROSCOPIC EXAMINATION

## 2018-01-23 LAB — BAYER DCA HB A1C WAIVED: HB A1C (BAYER DCA - WAIVED): 9.4 % — ABNORMAL HIGH (ref <7.0)

## 2018-01-23 MED ORDER — CEPHALEXIN 500 MG PO CAPS: 500.0000 mg | ORAL_CAPSULE | Freq: Two times a day (BID) | ORAL | 0 refills | Status: DC

## 2018-01-23 MED ORDER — TRAMADOL HCL 50 MG PO TABS: ORAL_TABLET | ORAL | 2 refills | Status: DC

## 2018-01-23 MED ORDER — DULOXETINE HCL 30 MG PO CPEP: 30.0000 mg | ORAL_CAPSULE | Freq: Every day | ORAL | 1 refills | Status: DC

## 2018-01-23 NOTE — Addendum Note (Signed)
Addended by: Evelina Dun A on: 01/23/2018 01:51 PM   Modules accepted: Orders

## 2018-01-23 NOTE — Patient Instructions (Signed)

## 2018-01-23 NOTE — Progress Notes (Addendum)
Subjective:    Patient ID: Caitlin Mccarthy, female    DOB: [birth year], 82 y.o.   MRN: [id number]  Chief Complaint  Patient presents with  . freguent voiding    Urinary Frequency   This is a new problem. The current episode started in the past 7 days. The problem occurs every urination. The problem has been unchanged. The patient is experiencing no pain. Associated symptoms include frequency, nausea and urgency. Pertinent negatives include no flank pain, hematuria, hesitancy or vomiting. She has tried increased fluids for the symptoms. The treatment provided mild relief. Her past medical history is significant for recurrent UTIs.  Depression         This is a chronic problem.  The current episode started more than 1 year ago.   The onset quality is gradual.   The problem occurs intermittently.  Associated symptoms include decreased concentration, irritable, restlessness, decreased interest and sad.  Associated symptoms include no helplessness and no hopelessness.  Past treatments include SSRIs - Selective serotonin reuptake inhibitors.  Past medical history includes anxiety.   Anxiety  Presents for follow-up visit. Symptoms include decreased concentration, excessive worry, irritability, nausea, nervous/anxious behavior and restlessness. Patient reports no shortness of breath. The severity of symptoms is moderate.    Arthritis  Presents for follow-up visit. Affected locations include the right knee, left knee, left hip and right hip. Her pain is at a severity of 9/10.  Diabetes  She presents for her follow-up diabetic visit. She has type 2 diabetes mellitus. Hypoglycemia symptoms include nervousness/anxiousness.  Hypertension  This is a chronic problem. The current episode started more than 1 year ago. The problem has been resolved since onset. The problem is controlled. Associated symptoms include anxiety. Pertinent negatives include no peripheral edema or shortness of breath. Risk factors  for coronary artery disease include diabetes mellitus, dyslipidemia and obesity. The current treatment provides moderate improvement. There is no history of kidney disease, CAD/MI or heart failure.  Hyperlipidemia  This is a chronic problem. The current episode started more than 1 year ago. Recent lipid tests were reviewed and are normal. Exacerbating diseases include obesity. Pertinent negatives include no shortness of breath. The current treatment provides moderate improvement of lipids.      Review of Systems  Constitutional: Positive for irritability.  Respiratory: Negative for shortness of breath.   Gastrointestinal: Positive for nausea. Negative for vomiting.  Genitourinary: Positive for frequency and urgency. Negative for flank pain, hematuria and hesitancy.  Musculoskeletal: Positive for arthritis.  Psychiatric/Behavioral: Positive for decreased concentration and depression. The patient is nervous/anxious.   All other systems reviewed and are negative.      Objective:   Physical Exam  Constitutional: She is oriented to person, place, and time. She appears well-developed and well-nourished. She is irritable. No distress.  HENT:  Head: Normocephalic and atraumatic.  Right Ear: External ear normal.  Left Ear: External ear normal.  Mouth/Throat: Oropharynx is clear and moist.  Eyes: Pupils are equal, round, and reactive to light.  Neck: Normal range of motion. Neck supple. No thyromegaly present.  Cardiovascular: Normal rate, regular rhythm, normal heart sounds and intact distal pulses.  No murmur heard. Pulmonary/Chest: Effort normal and breath sounds normal. No respiratory distress. She has no wheezes.  Abdominal: Soft. Bowel sounds are normal. She exhibits no distension. There is no tenderness.  Musculoskeletal: She exhibits edema (trace BLE). She exhibits no tenderness.  Generalized weakness, using rolling walker   Neurological: She is alert and oriented to  person, place,  and time. She has normal reflexes. No cranial nerve deficit.  Skin: Skin is warm and dry.  Psychiatric: She has a normal mood and affect. Her behavior is normal. Judgment and thought content normal.  Vitals reviewed.     BP 121/63   Pulse 77   Temp (!) 97 F (36.1 C) (Oral)   Ht _0  (1.778 m)   Wt 212 lb (96.2 kg)   BMI 30.42 kg/m      Assessment & Plan:  JAZYAH BUTSCH comes in today with chief complaint of freguent voiding   Diagnosis and orders addressed:  1. Dysuria - Urinalysis, Complete  2. Acute left-sided low back pain without sciatica - traMADol (ULTRAM) 50 MG tablet; TAKE 1 TO 2 TABLETS EVERY 12 HOURS AS NEEDED FOR PAIN  Dispense: 60 tablet; Refill: 2  3. Other chronic pain  4. Episode of recurrent major depressive disorder, unspecified depression episode severity ([place]) Will stop Zoloft and start cymbalta today Stress management discussed - DULoxetine (CYMBALTA) 30 MG capsule; Take 1 capsule (30 mg total) by mouth daily.  Dispense: 90 capsule; Refill: 1  5. GAD (generalized anxiety disorder) - DULoxetine (CYMBALTA) 30 MG capsule; Take 1 capsule (30 mg total) by mouth daily.  Dispense: 90 capsule; Refill: 1  6. Primary osteoarthritis of left hip  7. Acute cystitis with hematuria Force fluids RTO prn Culture pending - Urine Culture - cephALEXin (KEFLEX) 500 MG capsule; Take 1 capsule (500 mg total) by mouth 2 (two) times daily.  Dispense: 14 capsule; Refill: 0  8. Current smoker - CMP14+EGFR - CBC with Differential/Platelet  9. Essential hypertension, benign - CMP14+EGFR - CBC with Differential/Platelet  10. Pure hyperglyceridemia - CMP14+EGFR - CBC with Differential/Platelet  11. Type 2 diabetes mellitus with hyperglycemia, without long-term current use of insulin (HCC) - CMP14+EGFR - CBC with Differential/Platelet - Bayer DCA Hb A1c Waived  12. Vitamin D deficiency - CMP14+EGFR - CBC with Differential/Platelet  13. Hyperlipidemia,  unspecified hyperlipidemia type - CMP14+EGFR - CBC with Differential/Platelet - Lipid panel   Labs pending Health Maintenance reviewed Diet and exercise encouraged  Follow up plan: 6 weeks to recheck GAD and Depression    Evelina Dun, FNP

## 2018-01-24 LAB — CMP14+EGFR
ALBUMIN: 4.4 g/dL (ref 3.5–4.8)
ALK PHOS: 59 IU/L (ref 39–117)
ALT: 31 IU/L (ref 0–32)
AST: 37 IU/L (ref 0–40)
Albumin/Globulin Ratio: 1.2 (ref 1.2–2.2)
BUN / CREAT RATIO: 26 (ref 12–28)
BUN: 30 mg/dL — AB (ref 8–27)
Bilirubin Total: <0.2 mg/dL (ref 0.0–1.2)
CO2: 21 mmol/L (ref 20–29)
Calcium: 11.9 mg/dL — ABNORMAL HIGH (ref 8.7–10.3)
Chloride: 99 mmol/L (ref 96–106)
Creatinine, Ser: 1.15 mg/dL — ABNORMAL HIGH (ref 0.57–1.00)
GFR calc Af Amer: 52 mL/min/{1.73_m2} — ABNORMAL LOW (ref >59)
GFR calc non Af Amer: 45 mL/min/{1.73_m2} — ABNORMAL LOW (ref >59)
GLUCOSE: 363 mg/dL — AB (ref 65–99)
Globulin, Total: 3.7 g/dL (ref 1.5–4.5)
Potassium: 5.7 mmol/L — ABNORMAL HIGH (ref 3.5–5.2)
Sodium: 137 mmol/L (ref 134–144)
TOTAL PROTEIN: 8.1 g/dL (ref 6.0–8.5)

## 2018-01-24 LAB — CBC WITH DIFFERENTIAL/PLATELET
BASOS: 0 %
Basophils Absolute: 0 10*3/uL (ref 0.0–0.2)
EOS (ABSOLUTE): 0.1 10*3/uL (ref 0.0–0.4)
EOS: 1 %
HEMATOCRIT: 34.8 % (ref 34.0–46.6)
Hemoglobin: 11 g/dL — ABNORMAL LOW (ref 11.1–15.9)
IMMATURE GRANULOCYTES: 0 %
Immature Grans (Abs): 0 10*3/uL (ref 0.0–0.1)
LYMPHS ABS: 2.8 10*3/uL (ref 0.7–3.1)
Lymphs: 42 %
MCH: 29.6 pg (ref 26.6–33.0)
MCHC: 31.6 g/dL (ref 31.5–35.7)
MCV: 94 fL (ref 79–97)
MONOS ABS: 0.5 10*3/uL (ref 0.1–0.9)
Monocytes: 8 %
NEUTROS ABS: 3.3 10*3/uL (ref 1.4–7.0)
NEUTROS PCT: 49 %
Platelets: 247 10*3/uL (ref 150–450)
RBC: 3.71 x10E6/uL — ABNORMAL LOW (ref 3.77–5.28)
RDW: 13.2 % (ref 12.3–15.4)
WBC: 6.8 10*3/uL (ref 3.4–10.8)

## 2018-01-24 LAB — LIPID PANEL
CHOL/HDL RATIO: 8 ratio — AB (ref 0.0–4.4)
Cholesterol, Total: 271 mg/dL — ABNORMAL HIGH (ref 100–199)
HDL: 34 mg/dL — ABNORMAL LOW (ref >39)
LDL Calculated: 160 mg/dL — ABNORMAL HIGH (ref 0–99)
Triglycerides: 383 mg/dL — ABNORMAL HIGH (ref 0–149)
VLDL Cholesterol Cal: 77 mg/dL — ABNORMAL HIGH (ref 5–40)

## 2018-01-26 LAB — URINE CULTURE

## 2018-01-27 ENCOUNTER — Other Ambulatory Visit: Payer: Self-pay | Admitting: Family

## 2018-01-27 DIAGNOSIS — E875 Hyperkalemia: Secondary | ICD-10-CM

## 2018-01-27 MED ORDER — CIPROFLOXACIN HCL 500 MG PO TABS: 500.0000 mg | ORAL_TABLET | Freq: Two times a day (BID) | ORAL | 0 refills | Status: DC

## 2018-01-27 MED ORDER — SEMAGLUTIDE(0.25 OR 0.5MG/DOS) 2 MG/1.5ML ~~LOC~~ SOPN: PEN_INJECTOR | SUBCUTANEOUS | 1 refills | Status: DC

## 2018-01-28 ENCOUNTER — Encounter: Payer: Self-pay | Admitting: Family

## 2018-02-03 DIAGNOSIS — H43813 Vitreous degeneration, bilateral: Secondary | ICD-10-CM | POA: Diagnosis not present

## 2018-02-03 DIAGNOSIS — E119 Type 2 diabetes mellitus without complications: Secondary | ICD-10-CM | POA: Diagnosis not present

## 2018-02-03 DIAGNOSIS — H353131 Nonexudative age-related macular degeneration, bilateral, early dry stage: Secondary | ICD-10-CM | POA: Diagnosis not present

## 2018-02-03 DIAGNOSIS — Z7984 Long term (current) use of oral hypoglycemic drugs: Secondary | ICD-10-CM | POA: Diagnosis not present

## 2018-02-06 ENCOUNTER — Ambulatory Visit (INDEPENDENT_AMBULATORY_CARE_PROVIDER_SITE_OTHER): Payer: Medicare HMO | Admitting: Family

## 2018-02-06 ENCOUNTER — Encounter: Payer: Self-pay | Admitting: Family

## 2018-02-06 VITALS — BP 112/60 | HR 78 | Temp 96.9°F | Ht 70.0 in | Wt 207.0 lb

## 2018-02-06 DIAGNOSIS — K219 Gastro-esophageal reflux disease without esophagitis: Secondary | ICD-10-CM | POA: Diagnosis not present

## 2018-02-06 DIAGNOSIS — R110 Nausea: Secondary | ICD-10-CM

## 2018-02-06 DIAGNOSIS — Z87440 Personal history of urinary (tract) infections: Secondary | ICD-10-CM

## 2018-02-06 DIAGNOSIS — E1165 Type 2 diabetes mellitus with hyperglycemia: Secondary | ICD-10-CM | POA: Diagnosis not present

## 2018-02-06 DIAGNOSIS — R350 Frequency of micturition: Secondary | ICD-10-CM

## 2018-02-06 MED ORDER — METFORMIN HCL ER 500 MG PO TB24: 1000.0000 mg | ORAL_TABLET | Freq: Every day | ORAL | 1 refills | Status: DC

## 2018-02-06 MED ORDER — OMEPRAZOLE 20 MG PO CPDR: 20.0000 mg | DELAYED_RELEASE_CAPSULE | Freq: Every day | ORAL | 3 refills | Status: DC

## 2018-02-06 NOTE — Progress Notes (Signed)
Subjective:    Patient ID: Caitlin Mccarthy, female    DOB: 1936/01/13, 82 y.o.   MRN: [id number]  Chief Complaint  Patient presents with  . recheck bladder infection    has nausea at times   PT presents to the office today to recheck Urine. Pt was seen on 01/23/18 with confusion and diagnosed with UTI. She has also had nausea and belching over the last 3-4 months.   Urinary Frequency   This is a recurrent problem. The current episode started 1 to 4 weeks ago. The problem occurs intermittently. The problem has been waxing and waning. The pain is at a severity of 0/10. The patient is experiencing no pain. Associated symptoms include frequency, nausea and urgency. Pertinent negatives include no hematuria. She has tried increased fluids for the symptoms. The treatment provided mild relief.  Diabetes  She presents for her follow-up diabetic visit. She has type 2 diabetes mellitus. Her disease course has been stable. Pertinent negatives for diabetes include no blurred vision and no foot paresthesias. Her weight is stable. She is following a diabetic diet. Her overall blood glucose range is 180-200 mg/dl.      Review of Systems  Eyes: Negative for blurred vision.  Gastrointestinal: Positive for nausea.  Genitourinary: Positive for frequency and urgency. Negative for hematuria.  All other systems reviewed and are negative.      Objective:   Physical Exam  Constitutional: She is oriented to person, place, and time. She appears well-developed and well-nourished. No distress.  HENT:  Head: Normocephalic and atraumatic.  Right Ear: External ear normal.  Left Ear: External ear normal.  Mouth/Throat: Oropharynx is clear and moist.  Eyes: Pupils are equal, round, and reactive to light.  Neck: Normal range of motion. Neck supple. No thyromegaly present.  Cardiovascular: Normal rate, regular rhythm, normal heart sounds and intact distal pulses.  No murmur heard. Pulmonary/Chest: Effort  normal and breath sounds normal. No respiratory distress. She has no wheezes.  Abdominal: Soft. Bowel sounds are normal. She exhibits no distension. There is no tenderness.  Belching   Musculoskeletal: Normal range of motion. She exhibits no edema or tenderness.  Neurological: She is alert and oriented to person, place, and time. She has normal reflexes. No cranial nerve deficit.  Skin: Skin is warm and dry.  Psychiatric: She has a normal mood and affect. Her behavior is normal. Judgment and thought content normal.  Vitals reviewed.     BP 112/60   Pulse 78   Temp (!) 96.9 F (36.1 C) (Oral)   Ht 5\' 10"  (1.778 m)   Wt 207 lb (93.9 kg)   BMI 29.70 kg/m      Assessment & Plan:  Caitlin Mccarthy comes in today with chief complaint of recheck bladder infection (has nausea at times)   Diagnosis and orders addressed:  1. History of UTI - Urinalysis, Complete  2. Urinary frequency - Urine Culture  3. Nausea  4. Gastroesophageal reflux disease, esophagitis presence not specified Start Prilosec today -Diet discussed- Avoid fried, spicy, citrus foods, caffeine and alcohol -Do not eat 2-3 hours before bedtime -Encouraged small frequent meals -Avoid NSAID's - omeprazole (PRILOSEC) 20 MG capsule; Take 1 capsule (20 mg total) by mouth daily.  Dispense: 30 capsule; Refill: 3  5. Type 2 diabetes mellitus with hyperglycemia, without long-term current use of insulin (HCC) Will change metformin to XR related to diarrhea - Glucose Hemocue Waived   Labs pending Health Maintenance reviewed Diet and exercise encouraged  Follow up plan: 1 month to recheck nausea and DM   Caitlin Dun, FNP

## 2018-02-06 NOTE — Patient Instructions (Signed)

## 2018-02-08 LAB — URINE CULTURE

## 2018-02-10 LAB — URINALYSIS, COMPLETE
BILIRUBIN UA: NEGATIVE
GLUCOSE, UA: NEGATIVE
KETONES UA: NEGATIVE
NITRITE UA: NEGATIVE
SPEC GRAV UA: 1.015 (ref 1.005–1.030)
UUROB: 0.2 mg/dL (ref 0.2–1.0)
pH, UA: 5 (ref 5.0–7.5)

## 2018-02-10 LAB — MICROSCOPIC EXAMINATION
Epithelial Cells (non renal): >10 /hpf — AB (ref 0–10)
Renal Epithel, UA: NONE SEEN /hpf

## 2018-02-10 LAB — GLUCOSE HEMOCUE WAIVED: Glu Hemocue Waived: 140 mg/dL — ABNORMAL HIGH (ref 65–99)

## 2018-02-11 MED ORDER — ONDANSETRON 4 MG PO TBDP
4.0000 mg | ORAL_TABLET | Freq: Three times a day (TID) | ORAL | 0 refills | Status: DC | PRN
Start: 2018-02-11 — End: 2019-02-17

## 2018-02-13 ENCOUNTER — Ambulatory Visit (INDEPENDENT_AMBULATORY_CARE_PROVIDER_SITE_OTHER): Payer: Medicare HMO | Admitting: *Deleted

## 2018-02-13 DIAGNOSIS — I63419 Cerebral infarction due to embolism of unspecified middle cerebral artery: Secondary | ICD-10-CM | POA: Diagnosis not present

## 2018-02-16 NOTE — Progress Notes (Signed)
Carelink Summary Report / Loop Recorder 

## 2018-02-23 ENCOUNTER — Other Ambulatory Visit: Payer: Self-pay | Admitting: Family

## 2018-02-23 DIAGNOSIS — F339 Major depressive disorder, recurrent, unspecified: Secondary | ICD-10-CM

## 2018-02-23 DIAGNOSIS — F411 Generalized anxiety disorder: Secondary | ICD-10-CM

## 2018-02-24 LAB — CUP PACEART REMOTE DEVICE CHECK
Implantable Pulse Generator Implant Date: 20181002
MDC IDC SESS DTM: 20190714224034

## 2018-03-09 ENCOUNTER — Other Ambulatory Visit: Payer: Self-pay | Admitting: Cardiovascular Disease

## 2018-03-18 ENCOUNTER — Ambulatory Visit (INDEPENDENT_AMBULATORY_CARE_PROVIDER_SITE_OTHER): Payer: Medicare HMO | Admitting: *Deleted

## 2018-03-18 DIAGNOSIS — I63419 Cerebral infarction due to embolism of unspecified middle cerebral artery: Secondary | ICD-10-CM

## 2018-03-19 NOTE — Progress Notes (Signed)
Carelink Summary Report / Loop Recorder 

## 2018-03-23 LAB — CUP PACEART REMOTE DEVICE CHECK
Date Time Interrogation Session: 20190816223655
MDC IDC PG IMPLANT DT: 20181002

## 2018-03-30 LAB — CUP PACEART REMOTE DEVICE CHECK
Implantable Pulse Generator Implant Date: 20181002
MDC IDC SESS DTM: 20190918233617

## 2018-04-06 ENCOUNTER — Ambulatory Visit: Payer: Medicare HMO | Admitting: Family

## 2018-04-06 ENCOUNTER — Encounter: Payer: Self-pay | Admitting: Family

## 2018-04-06 ENCOUNTER — Ambulatory Visit (INDEPENDENT_AMBULATORY_CARE_PROVIDER_SITE_OTHER): Payer: Medicare HMO | Admitting: Family

## 2018-04-06 VITALS — BP 122/69 | HR 82 | Temp 97.2°F | Ht 70.0 in | Wt 204.4 lb

## 2018-04-06 DIAGNOSIS — Z23 Encounter for immunization: Secondary | ICD-10-CM | POA: Diagnosis not present

## 2018-04-06 DIAGNOSIS — N3000 Acute cystitis without hematuria: Secondary | ICD-10-CM | POA: Diagnosis not present

## 2018-04-06 DIAGNOSIS — F17200 Nicotine dependence, unspecified, uncomplicated: Secondary | ICD-10-CM

## 2018-04-06 DIAGNOSIS — K219 Gastro-esophageal reflux disease without esophagitis: Secondary | ICD-10-CM | POA: Diagnosis not present

## 2018-04-06 DIAGNOSIS — Z8619 Personal history of other infectious and parasitic diseases: Secondary | ICD-10-CM | POA: Diagnosis not present

## 2018-04-06 DIAGNOSIS — E1165 Type 2 diabetes mellitus with hyperglycemia: Secondary | ICD-10-CM | POA: Diagnosis not present

## 2018-04-06 DIAGNOSIS — I10 Essential (primary) hypertension: Secondary | ICD-10-CM

## 2018-04-06 DIAGNOSIS — M160 Bilateral primary osteoarthritis of hip: Secondary | ICD-10-CM | POA: Diagnosis not present

## 2018-04-06 LAB — URINALYSIS, COMPLETE
BILIRUBIN UA: NEGATIVE
GLUCOSE, UA: NEGATIVE
KETONES UA: NEGATIVE
Nitrite, UA: POSITIVE — AB
RBC UA: NEGATIVE
SPEC GRAV UA: 1.02 (ref 1.005–1.030)
UUROB: 0.2 mg/dL (ref 0.2–1.0)
pH, UA: 5 (ref 5.0–7.5)

## 2018-04-06 LAB — BAYER DCA HB A1C WAIVED: HB A1C: 7.8 % — AB (ref <7.0)

## 2018-04-06 LAB — MICROSCOPIC EXAMINATION
Renal Epithel, UA: NONE SEEN /hpf
WBC, UA: >30 /hpf — AB (ref 0–5)

## 2018-04-06 MED ORDER — CEPHALEXIN 500 MG PO CAPS: 500.0000 mg | ORAL_CAPSULE | Freq: Two times a day (BID) | ORAL | 0 refills | Status: DC

## 2018-04-06 NOTE — Progress Notes (Signed)
Subjective:    Patient ID: Caitlin Mccarthy, female    DOB: [birth year], 82 y.o.   MRN: [id number]  Chief Complaint  Patient presents with  . Diabetes    two month recheck    Diabetes  She presents for her follow-up diabetic visit. She has type 2 diabetes mellitus. There are no hypoglycemic associated symptoms. Associated symptoms include foot paresthesias. Pertinent negatives for diabetes include no blurred vision and no visual change. Symptoms are stable. Diabetic complications include heart disease, nephropathy and peripheral neuropathy. Risk factors for coronary artery disease include diabetes mellitus, dyslipidemia, hypertension, sedentary lifestyle and post-menopausal. She is following a generally unhealthy diet. Her overall blood glucose range is 110-130 mg/dl.  Urinary Frequency   This is a recurrent problem. The current episode started more than 1 month ago. The problem occurs intermittently. The problem has been waxing and waning. The patient is experiencing no pain. Associated symptoms include frequency and urgency. Pertinent negatives include no flank pain, hematuria or hesitancy.  Hypertension  This is a chronic problem. The current episode started more than 1 year ago. The problem has been resolved since onset. The problem is controlled. Associated symptoms include malaise/fatigue. Pertinent negatives include no blurred vision, peripheral edema or shortness of breath.  Arthritis  Presents for follow-up visit. She complains of pain, stiffness and joint swelling. The symptoms have been stable. Affected locations include the right knee and left knee. Her pain is at a severity of 9/10.  Gastroesophageal Reflux  She complains of heartburn and a hoarse voice. She reports no belching or no choking. This is a chronic problem. The current episode started more than 1 year ago. The problem occurs occasionally. Risk factors include obesity and smoking/tobacco exposure. She has tried a PPI for  the symptoms. The treatment provided moderate relief.      Review of Systems  Constitutional: Positive for malaise/fatigue.  HENT: Positive for hoarse voice.   Eyes: Negative for blurred vision.  Respiratory: Negative for choking and shortness of breath.   Gastrointestinal: Positive for heartburn.  Genitourinary: Positive for frequency and urgency. Negative for flank pain, hematuria and hesitancy.  Musculoskeletal: Positive for arthritis, joint swelling and stiffness.  All other systems reviewed and are negative.      Objective:   Physical Exam  Constitutional: She is oriented to person, place, and time. She appears well-developed and well-nourished. No distress.  HENT:  Head: Normocephalic and atraumatic.  Right Ear: External ear normal.  Left Ear: External ear normal.  Mouth/Throat: Oropharynx is clear and moist.  Eyes: Pupils are equal, round, and reactive to light.  Neck: Normal range of motion. Neck supple. No thyromegaly present.  Cardiovascular: Normal rate, regular rhythm, normal heart sounds and intact distal pulses.  No murmur heard. Pulmonary/Chest: Effort normal and breath sounds normal. No respiratory distress. She has no wheezes.  Abdominal: Soft. Bowel sounds are normal. She exhibits no distension. There is no tenderness.  Musculoskeletal: Normal range of motion. She exhibits no edema or tenderness.  Using rolling walker, pain in bilateral knees with flexion  Neurological: She is alert and oriented to person, place, and time. She has normal reflexes. No cranial nerve deficit.  Skin: Skin is warm and dry.  Psychiatric: She has a normal mood and affect. Her behavior is normal. Judgment and thought content normal.  Vitals reviewed.     BP 122/69   Pulse 82   Temp (!) 97.2 F (36.2 C) (Oral)   Ht _0  (1.778 m)  Wt 204 lb 6.4 oz (92.7 kg)   BMI 29.33 kg/m      Assessment & Plan:  Caitlin Mccarthy comes in today with chief complaint of Diabetes (two  month recheck)   Diagnosis and orders addressed:  1. History of infection - Urinalysis, Complete - CMP14+EGFR - CBC with Differential/Platelet  2. Type 2 diabetes mellitus with hyperglycemia, without long-term current use of insulin (HCC) - Bayer DCA Hb A1c Waived - CMP14+EGFR - CBC with Differential/Platelet  3. Current smoker - CMP14+EGFR - CBC with Differential/Platelet  4. Essential hypertension, benign - CMP14+EGFR - CBC with Differential/Platelet  5. Gastroesophageal reflux disease without esophagitis - CMP14+EGFR - CBC with Differential/Platelet  6. Primary osteoarthritis of both hips - CMP14+EGFR - CBC with Differential/Platelet  7. Acute cystitis without hematuria Force fluids AZO over the counter X2 days RTO prn Culture pending - cephALEXin (KEFLEX) 500 MG capsule; Take 1 capsule (500 mg total) by mouth 2 (two) times daily.  Dispense: 14 capsule; Refill: 0 - Urine Culture   Labs pending Health Maintenance reviewed Diet and exercise encouraged  Follow up plan: 3 months    Evelina Dun, FNP

## 2018-04-06 NOTE — Patient Instructions (Signed)

## 2018-04-07 DIAGNOSIS — H04123 Dry eye syndrome of bilateral lacrimal glands: Secondary | ICD-10-CM | POA: Diagnosis not present

## 2018-04-07 DIAGNOSIS — Z9849 Cataract extraction status, unspecified eye: Secondary | ICD-10-CM | POA: Diagnosis not present

## 2018-04-07 DIAGNOSIS — H353131 Nonexudative age-related macular degeneration, bilateral, early dry stage: Secondary | ICD-10-CM | POA: Diagnosis not present

## 2018-04-07 DIAGNOSIS — Z961 Presence of intraocular lens: Secondary | ICD-10-CM | POA: Diagnosis not present

## 2018-04-07 LAB — CMP14+EGFR
A/G RATIO: 1.3 (ref 1.2–2.2)
ALBUMIN: 4.4 g/dL (ref 3.5–4.8)
ALK PHOS: 62 IU/L (ref 39–117)
ALT: 32 IU/L (ref 0–32)
AST: 48 IU/L — ABNORMAL HIGH (ref 0–40)
BILIRUBIN TOTAL: 0.3 mg/dL (ref 0.0–1.2)
BUN / CREAT RATIO: 25 (ref 12–28)
BUN: 27 mg/dL (ref 8–27)
CHLORIDE: 101 mmol/L (ref 96–106)
CO2: 22 mmol/L (ref 20–29)
Calcium: 11.5 mg/dL — ABNORMAL HIGH (ref 8.7–10.3)
Creatinine, Ser: 1.1 mg/dL — ABNORMAL HIGH (ref 0.57–1.00)
GFR calc Af Amer: 55 mL/min/{1.73_m2} — ABNORMAL LOW (ref >59)
GFR calc non Af Amer: 48 mL/min/{1.73_m2} — ABNORMAL LOW (ref >59)
GLUCOSE: 124 mg/dL — AB (ref 65–99)
Globulin, Total: 3.5 g/dL (ref 1.5–4.5)
POTASSIUM: 4.5 mmol/L (ref 3.5–5.2)
SODIUM: 139 mmol/L (ref 134–144)
Total Protein: 7.9 g/dL (ref 6.0–8.5)

## 2018-04-07 LAB — CBC WITH DIFFERENTIAL/PLATELET
BASOS ABS: 0 10*3/uL (ref 0.0–0.2)
Basos: 1 %
EOS (ABSOLUTE): 0.1 10*3/uL (ref 0.0–0.4)
Eos: 2 %
Hematocrit: 35.7 % (ref 34.0–46.6)
Hemoglobin: 11.7 g/dL (ref 11.1–15.9)
Immature Grans (Abs): 0 10*3/uL (ref 0.0–0.1)
Immature Granulocytes: 0 %
LYMPHS ABS: 3.1 10*3/uL (ref 0.7–3.1)
Lymphs: 47 %
MCH: 28.7 pg (ref 26.6–33.0)
MCHC: 32.8 g/dL (ref 31.5–35.7)
MCV: 88 fL (ref 79–97)
MONOS ABS: 0.6 10*3/uL (ref 0.1–0.9)
Monocytes: 9 %
NEUTROS ABS: 2.7 10*3/uL (ref 1.4–7.0)
Neutrophils: 41 %
Platelets: 297 10*3/uL (ref 150–450)
RBC: 4.08 x10E6/uL (ref 3.77–5.28)
RDW: 13.5 % (ref 12.3–15.4)
WBC: 6.5 10*3/uL (ref 3.4–10.8)

## 2018-04-09 LAB — URINE CULTURE

## 2018-04-13 ENCOUNTER — Other Ambulatory Visit: Payer: Self-pay | Admitting: Family

## 2018-04-13 MED ORDER — CIPROFLOXACIN HCL 500 MG PO TABS: 500.0000 mg | ORAL_TABLET | Freq: Two times a day (BID) | ORAL | 0 refills | Status: DC

## 2018-04-20 ENCOUNTER — Ambulatory Visit: Payer: Medicare HMO | Admitting: *Deleted

## 2018-04-20 ENCOUNTER — Ambulatory Visit (INDEPENDENT_AMBULATORY_CARE_PROVIDER_SITE_OTHER): Payer: Medicare HMO | Admitting: *Deleted

## 2018-04-20 DIAGNOSIS — I63419 Cerebral infarction due to embolism of unspecified middle cerebral artery: Secondary | ICD-10-CM | POA: Diagnosis not present

## 2018-04-21 NOTE — Progress Notes (Signed)
Carelink Summary Report / Loop Recorder 

## 2018-04-27 NOTE — Progress Notes (Signed)
Office Visit Note  Patient: Caitlin Mccarthy             Date of Birth: 05-Apr-1936           MRN: [id number]             PCP: [name], FNP Referring: [name], * Visit Date: 05/11/2018 Occupation: @GUAROCC @  Subjective:  Pain in multiple joints   History of Present Illness: Caitlin Mccarthy is a 82 y.o. female with history of osteoarthritis and osteoporosis.  She is no longer on Fosamax for osteoporosis.  She is unsure when her last bone density scan is.  She denies any recent falls or lower back pain at this time.  She reports she continues to have chronic right knee pain following the knee replacement years ago.  She also continues to have right hip pain, and states the pain radiates to the right groin.  She walks with a walker on a daily basis.  She reports intermittent right knee swelling, and she has stiffness in both knee joints.  She has difficulty going up and down steps and getting up from a chair.  She followed up with Dr. Wynelle Link and had a XR of the right knee which was normal.   She reports pain and stiffness in both hands but denies any joint swelling.     Activities of Daily Living:  Patient reports morning stiffness for 20 minutes.   Patient Denies nocturnal pain.  Difficulty dressing/grooming: Denies Difficulty climbing stairs: Reports Difficulty getting out of chair: Reports Difficulty using hands for taps, buttons, cutlery, and/or writing: Reports  Review of Systems  Constitutional: Positive for fatigue.  HENT: Negative for mouth sores, mouth dryness and nose dryness.   Eyes: Positive for dryness. Negative for pain and visual disturbance.  Respiratory: Negative for cough, hemoptysis, shortness of breath and difficulty breathing.   Cardiovascular: Negative for chest pain, palpitations, hypertension and swelling in legs/feet.  Gastrointestinal: Positive for constipation. Negative for blood in stool and diarrhea.  Endocrine: Negative for  increased urination.  Genitourinary: Positive for nocturia. Negative for painful urination.  Musculoskeletal: Positive for arthralgias, joint pain and morning stiffness. Negative for joint swelling, myalgias, muscle weakness, muscle tenderness and myalgias.  Skin: Positive for redness (left hand ). Negative for color change, pallor, rash, hair loss, nodules/bumps, skin tightness, ulcers and sensitivity to sunlight.  Allergic/Immunologic: Negative for susceptible to infections.  Neurological: Negative for dizziness, numbness, headaches and weakness.  Hematological: Negative for swollen glands.  Psychiatric/Behavioral: Positive for depressed mood. Negative for sleep disturbance. The patient is not nervous/anxious.     PMFS History:  Patient Active Problem List   Diagnosis Date Noted  . Vocal cord nodule 01/11/2017  . Late effect of cerebrovascular accident (CVA) 01/10/2017  . OA (osteoarthritis) 01/09/2017  . History of kidney stones 01/09/2017  . Hyperlipidemia 01/09/2017  . Embolic stroke ([place]) 25/42/7062  . Glaucoma 05/10/2016  . Vitamin D deficiency 05/10/2016  . Current smoker 05/10/2016  . Osteoarthritis of both hands 05/10/2016  . History of total right hip replacement 05/10/2016  . History of total knee replacement, bilateral 05/10/2016  . Osteoporosis 05/09/2016  . Type 2 diabetes mellitus ([place]) 02/05/2016  . Overweight (BMI 25.0-29.9) 12/28/2015  . GAD (generalized anxiety disorder) 12/28/2015  . Hypokalemia 06/02/2015  . Chronic pain 04/13/2015  . Essential hypertension, benign 10/02/2012  . GERD (gastroesophageal reflux disease) 10/02/2012  . Depression 10/02/2012  . OA (osteoarthritis) of hip 10/28/2011    Past Medical  History:  Diagnosis Date  . Anxiety   . Depression   . GERD (gastroesophageal reflux disease)   . History of acute pyelonephritis    last episode 04-13-2015  w/ sepsis  . History of kidney stones   . History of recurrent UTIs    MULTIPLE  .  Hyperlipidemia   . Hypertension   . OA (osteoarthritis)    knees, hips, hands  . Osteoporosis   . Poor memory    especially when has uti  . Renal calculus, left   . Stroke ([place])   . Type 2 diabetes mellitus ([place])   . Vitamin D deficiency 05/10/2016    Family History  Problem Relation Age of Onset  . Kidney disease Mother   . Diabetes Mother   . Congestive Heart Failure Father   . Heart failure Father   . Heart disease Brother   . Alcohol abuse Brother   . Anesthesia problems Neg Hx   . Hypotension Neg Hx   . Malignant hyperthermia Neg Hx   . Pseudochol deficiency Neg Hx    Past Surgical History:  Procedure Laterality Date  . CATARACT EXTRACTION W/PHACO  06/06/2011   Procedure: CATARACT EXTRACTION PHACO AND INTRAOCULAR LENS PLACEMENT (IOC);  Surgeon: Tonny Branch;  Location: [facility];  Service: Ophthalmology;  Laterality: Right;  CDE=12.77  . CATARACT EXTRACTION W/PHACO  06/27/2011   Procedure: CATARACT EXTRACTION PHACO AND INTRAOCULAR LENS PLACEMENT (IOC);  Surgeon: Tonny Branch;  Location: [facility];  Service: Ophthalmology;  Laterality: Left;  CDE:13.96  . CYSTOSCOPY WITH URETEROSCOPY AND STENT PLACEMENT Right 04/27/2015   Procedure: CYSTOSCOPY WITH RIGHT URETEROSCOPY, BASKET REMOVAL OF STONE, REMOVAL OF RIGHT NEPHROSTOMY TUBE;  Surgeon: Irine Seal, MD;  Location: [facility];  Service: Urology;  Laterality: Right;  . CYSTOSCOPY/URETEROSCOPY/HOLMIUM LASER/STENT PLACEMENT Left 12/24/2016   Procedure: LEFT URETEROSCOPY WITH HOLMIUM LASER AND STENT PLACEMENT;  Surgeon: Irine Seal, MD;  Location: [facility];  Service: Urology;  Laterality: Left;  . EXTRACORPOREAL SHOCK WAVE LITHOTRIPSY  left 04-01-2016;  1980's  . FRACTURE SURGERY    . HIP ARTHROPLASTY    . HOLMIUM LASER APPLICATION Right 86/76/1950   Procedure: HOLMIUM LASER APPLICATION;  Surgeon: Irine Seal, MD;  [facility];  Service: Urology;  Laterality: Right;  . KNEE  ARTHROPLASTY    . KNEE ARTHROSCOPY Bilateral right 2005//  left ?  [name] LOOP RECORDER INSERTION N/A 04/01/2017   Procedure: LOOP RECORDER INSERTION;  Surgeon: Thompson Grayer, MD;  Location: [facility];  Service: Cardiovascular;  Laterality: N/A;  . TOTAL HIP ARTHROPLASTY  10/28/2011   Procedure: TOTAL HIP ARTHROPLASTY;  Surgeon: Gearlean Alf, MD;  Location: [facility];  Service: Orthopedics;  Laterality: Right;  . TOTAL KNEE ARTHROPLASTY Bilateral left 03-09-2007//  right 2006  . TRANSTHORACIC ECHOCARDIOGRAM  10/06/2006   normal echo,  ef 55-60%  . TUBAL LIGATION     Social History   Social History Narrative   Lives with husband    Objective: Vital Signs: BP 118/70 (BP Location: Left Arm, Patient Position: Sitting, Cuff Size: Normal)   Pulse 83   Resp 12   Ht 5\' 10"  (1.778 m)   Wt 204 lb 6.4 oz (92.7 kg)   BMI 29.33 kg/m    Physical Exam  Constitutional: She is oriented to person, place, and time. She appears well-developed and well-nourished.  HENT:  Head: Normocephalic and atraumatic.  Eyes: Conjunctivae and EOM are normal.  Neck: Normal range of motion.  Cardiovascular: Normal rate, regular rhythm, normal heart sounds and intact distal pulses.  Pulmonary/Chest: Effort normal and breath sounds normal.  Abdominal: Soft. Bowel sounds are normal.  Lymphadenopathy:    She has no cervical adenopathy.  Neurological: She is alert and oriented to person, place, and time.  Skin: Skin is warm and dry. Capillary refill takes less than 2 seconds.  Psychiatric: She has a normal mood and affect. Her behavior is normal.  Nursing note and vitals reviewed.    Musculoskeletal Exam: C-spine good ROM.  Thoracic kyphosis.  No midline spinal tenderness.  No SI joint tenderness. Shoulder joints, elbow joints, wrist joints, MCPs, PIPs, and DIPs good ROM with no synovitis.  PIP and DIP synovial thickening.  CMC joint synovial thickening. Right hip replacement discomfort with ROM.  Bilateral knee  replacements good ROM.  No warmth or effusion of knee joints.  No tenderness or swelling of ankle joints.   CDAI Exam: CDAI Score: Not documented Patient Global Assessment: Not documented; Provider Global Assessment: Not documented Swollen: Not documented; Tender: Not documented Joint Exam   Not documented   There is currently no information documented on the homunculus. Go to the Rheumatology activity and complete the homunculus joint exam.  Investigation: No additional findings.  Imaging: No results found.  Recent Labs: Lab Results  Component Value Date   WBC 6.5 04/06/2018   HGB 11.7 04/06/2018   PLT 297 04/06/2018   NA 139 04/06/2018   K 4.5 04/06/2018   CL 101 04/06/2018   CO2 22 04/06/2018   GLUCOSE 124 (H) 04/06/2018   BUN 27 04/06/2018   CREATININE 1.10 (H) 04/06/2018   BILITOT 0.3 04/06/2018   ALKPHOS 62 04/06/2018   AST 48 (H) 04/06/2018   ALT 32 04/06/2018   PROT 7.9 04/06/2018   ALBUMIN 4.4 04/06/2018   CALCIUM 11.5 (H) 04/06/2018   GFRAA 55 (L) 04/06/2018    Speciality Comments: No specialty comments available.  Procedures:  No procedures performed Allergies: Iodinated diagnostic agents and Latex   Assessment / Plan:     Visit Diagnoses: Primary osteoarthritis of both hands: She has PIP and DIP synovial thickening.  Bilateral CMC joint synovial thickening.  No tenderness or synovitis noted.  She reports stiffness in both hands.  Joint protection and muscle strengthening were discussed. A handout of hand exercises was provided to the patient.   Primary osteoarthritis of left hip: Doing well.  Good ROM.  No groin pain at this time.   History of total right hip replacement: She has chronic right groin pain.  She walks with a walker.  She was advised to follow up with Dr. Wynelle Link.   History of total knee replacement, bilateral: No warmth or effusion.  She has good ROM.  She has chronic right knee pain.  She followed up with Dr. Wynelle Link who obtained a XR,  which revealed normal alignment per patient. She continues to walk with a walker. She was advised to follow up with Dr. Wynelle Link if her pain persists.   Other osteoporosis without current pathological fracture - She is no longer taking Fosamax. PCP orders DEXA.  Most recent DEXA was 01/31/16 and BMD measured at forearm radius 33% is 0.658 g/cm2 with T-score of -2.6. She has not had any recent falls or fractures.  She walks with a walker.  She has no midline spinal tenderness. Fall prevention discussed.  She was encouraged to take calcium and vitamin D.  She is overdue for her next bone density scan.  A handout of information regarding fall prevention was provided to the patient.   History of vitamin D deficiency: She is no longer taking calcium and vitamin D.  Other medical conditions are listed as follows:   Type 2 diabetes mellitus with hyperglycemia, without long-term current use of insulin (HCC)  History of hyperlipidemia  History of gastroesophageal reflux (GERD)  History of hypertension  History of depression  History of obesity  History of glaucoma  Current smoker   Orders: No orders of the defined types were placed in this encounter.  No orders of the defined types were placed in this encounter.     Follow-Up Instructions: Return in about 1 year (around 05/12/2019) for Osteoarthritis, Osteoporosis.   Ofilia Neas, PA-C   I examined and evaluated the patient with Hazel Sams PA.  Patient had osteoarthritis in multiple joints as described above.  No synovitis was noted on my examination.  She has history of osteoporosis.  I have advised her to get a bone density and then contact us after the results are available.  The plan of care was discussed as noted above.  Bo Merino, MD  Note - This record has been created using Editor, commissioning.  Chart creation errors have been sought, but may not always  have been located. Such creation errors do not reflect on  the  standard of medical care.

## 2018-04-28 DIAGNOSIS — N3020 Other chronic cystitis without hematuria: Secondary | ICD-10-CM | POA: Diagnosis not present

## 2018-04-28 DIAGNOSIS — R3915 Urgency of urination: Secondary | ICD-10-CM | POA: Diagnosis not present

## 2018-04-29 ENCOUNTER — Ambulatory Visit: Payer: Medicare HMO | Admitting: Cardiovascular Disease

## 2018-04-29 ENCOUNTER — Encounter: Payer: Self-pay | Admitting: Cardiovascular Disease

## 2018-04-29 VITALS — BP 115/56 | HR 82 | Ht 70.0 in | Wt 208.6 lb

## 2018-04-29 DIAGNOSIS — I499 Cardiac arrhythmia, unspecified: Secondary | ICD-10-CM

## 2018-04-29 DIAGNOSIS — Z8673 Personal history of transient ischemic attack (TIA), and cerebral infarction without residual deficits: Secondary | ICD-10-CM | POA: Diagnosis not present

## 2018-04-29 DIAGNOSIS — I10 Essential (primary) hypertension: Secondary | ICD-10-CM | POA: Diagnosis not present

## 2018-04-29 DIAGNOSIS — E785 Hyperlipidemia, unspecified: Secondary | ICD-10-CM

## 2018-04-29 MED ORDER — ASPIRIN EC 81 MG PO TBEC: 81.0000 mg | DELAYED_RELEASE_TABLET | Freq: Every day | ORAL | Status: DC

## 2018-04-29 MED ORDER — ROSUVASTATIN CALCIUM 40 MG PO TABS: 40.0000 mg | ORAL_TABLET | Freq: Every day | ORAL | 6 refills | Status: DC

## 2018-04-29 NOTE — Progress Notes (Signed)
SUBJECTIVE: The patient presents for routine follow-up.  I reviewed her most recent device interrogation on 03/30/2018 which did not demonstrate any evidence of arrhythmia including atrial fibrillation or sinus pauses.  There was no tachycardia.  The patient denies any symptoms of chest pain, palpitations, shortness of breath, lightheadedness, dizziness, leg swelling, orthopnea, PND, and syncope.  She is here with her daughter, Rojelio Brenner, who owns [employer] in [place].  Her son is studying business at [employer] and plays golf for the college team.  The patient has been taking apixaban twice daily without bleeding problems.      Review of Systems: As per "subjective", otherwise negative.  Allergies  Allergen Reactions  . Iodinated Diagnostic Agents   . Latex     Current Outpatient Medications  Medication Sig Dispense Refill  . amLODipine-benazepril (LOTREL) 10-40 MG capsule TAKE ONE CAPSULE EACH MORNING 90 capsule 1  . apixaban (ELIQUIS) 5 MG TABS tablet Take 1 tablet (5 mg total) by mouth 2 (two) times daily. 60 tablet 5  . calcium-vitamin D (OSCAL WITH D) 500-200 MG-UNIT tablet Take 1 tablet by mouth daily.    . canagliflozin (INVOKANA) 100 MG TABS tablet Take 1 tablet (100 mg total) by mouth daily before breakfast. 30 tablet 5  . chlorthalidone (HYGROTON) 25 MG tablet     . DULoxetine (CYMBALTA) 30 MG capsule TAKE ONE (1) CAPSULE EACH DAY 90 capsule 0  . fenofibrate (TRICOR) 145 MG tablet fenofibrate nanocrystallized 145 mg tablet    . metFORMIN (GLUCOPHAGE XR) 500 MG 24 hr tablet Take 2 tablets (1,000 mg total) by mouth daily with breakfast. 180 tablet 1  . omeprazole (PRILOSEC) 20 MG capsule Take 1 capsule (20 mg total) by mouth daily. 30 capsule 3  . ondansetron (ZOFRAN ODT) 4 MG disintegrating tablet Take 1 tablet (4 mg total) by mouth every 8 (eight) hours as needed for nausea or vomiting. 20 tablet 0  . OZEMPIC 0.25 or 0.5 MG/DOSE SOPN INJECT 0.25MG  SQ  ONCE WEEKLY FOR 28 DAYSTHEN 0.5MG  ONCE WEEKLY 1.5 mL 2  . PREMARIN vaginal cream Apply 1 application topically daily as needed (irritation).     . rosuvastatin (CRESTOR) 20 MG tablet Take 1 tablet (20 mg total) by mouth at bedtime. 90 tablet 1  . traMADol (ULTRAM) 50 MG tablet TAKE 1 TO 2 TABLETS EVERY 12 HOURS AS NEEDED FOR PAIN 60 tablet 2   No current facility-administered medications for this visit.     Past Medical History:  Diagnosis Date  . Anxiety   . Depression   . GERD (gastroesophageal reflux disease)   . History of acute pyelonephritis    last episode 04-13-2015  w/ sepsis  . History of kidney stones   . History of recurrent UTIs    MULTIPLE  . Hyperlipidemia   . Hypertension   . OA (osteoarthritis)    knees, hips, hands  . Osteoporosis   . Poor memory    especially when has uti  . Renal calculus, left   . Stroke ([place])   . Type 2 diabetes mellitus ([place])   . Vitamin D deficiency 05/10/2016    Past Surgical History:  Procedure Laterality Date  . CATARACT EXTRACTION W/PHACO  06/06/2011   Procedure: CATARACT EXTRACTION PHACO AND INTRAOCULAR LENS PLACEMENT (IOC);  Surgeon: Tonny Branch;  [facility]: [facility];  Service: Ophthalmology;  Laterality: Right;  CDE=12.77  . CATARACT EXTRACTION W/PHACO  06/27/2011   Procedure: CATARACT EXTRACTION PHACO AND INTRAOCULAR  LENS PLACEMENT (IOC);  Surgeon: Tonny Branch;  Location: [facility];  Service: Ophthalmology;  Laterality: Left;  CDE:13.96  . CYSTOSCOPY WITH URETEROSCOPY AND STENT PLACEMENT Right 04/27/2015   Procedure: CYSTOSCOPY WITH RIGHT URETEROSCOPY, BASKET REMOVAL OF STONE, REMOVAL OF RIGHT NEPHROSTOMY TUBE;  Surgeon: Irine Seal, MD;  Location: [facility];  Service: Urology;  Laterality: Right;  . CYSTOSCOPY/URETEROSCOPY/HOLMIUM LASER/STENT PLACEMENT Left 12/24/2016   Procedure: LEFT URETEROSCOPY WITH HOLMIUM LASER AND STENT PLACEMENT;  Surgeon: Irine Seal, MD;  Location: [facility];  Service: Urology;   Laterality: Left;  . EXTRACORPOREAL SHOCK WAVE LITHOTRIPSY  left 04-01-2016;  1980's  . FRACTURE SURGERY    . HIP ARTHROPLASTY    . HOLMIUM LASER APPLICATION Right 64/40/3474   Procedure: HOLMIUM LASER APPLICATION;  Surgeon: Irine Seal, MD;  Location: [facility];  Service: Urology;  Laterality: Right;  . KNEE ARTHROPLASTY    . KNEE ARTHROSCOPY Bilateral right 2005//  left ?  [name] LOOP RECORDER INSERTION N/A 04/01/2017   Procedure: LOOP RECORDER INSERTION;  Surgeon: Thompson Grayer, MD;  Location: [place] [facility];  Service: Cardiovascular;  Laterality: N/A;  . TOTAL HIP ARTHROPLASTY  10/28/2011   Procedure: TOTAL HIP ARTHROPLASTY;  Surgeon: Gearlean Alf, MD;  [facility];  Service: Orthopedics;  Laterality: Right;  . TOTAL KNEE ARTHROPLASTY Bilateral left 03-09-2007//  right 2006  . TRANSTHORACIC ECHOCARDIOGRAM  10/06/2006   normal echo,  ef 55-60%  . TUBAL LIGATION      Social History   Socioeconomic History  . Marital status: Married    Spouse name: Not on file  . Number of children: 2  . Years of education: 22  . Highest education level: Not on file  Occupational History    Comment: retired  Scientific laboratory technician  . Financial resource strain: Not on file  . Food insecurity:    Worry: Not on file    Inability: Not on file  . Transportation needs:    Medical: Not on file    Non-medical: Not on file  Tobacco Use  . Smoking status: Current Every Day Smoker    Packs/day: 0.25    Years: 42.00    Pack years: 10.50    Types: Cigarettes  . Smokeless tobacco: Never Used  . Tobacco comment: 03/17/17 haven't qiut completely  Substance and Sexual Activity  . Alcohol use: No  . Drug use: No  . Sexual activity: Not on file  Lifestyle  . Physical activity:    Days per week: Not on file    Minutes per session: Not on file  . Stress: Not on file  Relationships  . Social connections:    Talks on phone: Not on file    Gets together: Not on file    Attends religious  service: Not on file    Active member of club or organization: Not on file    Attends meetings of clubs or organizations: Not on file    Relationship status: Not on file  . Intimate partner violence:    Fear of current or ex partner: Not on file    Emotionally abused: Not on file    Physically abused: Not on file    Forced sexual activity: Not on file  Other Topics Concern  . Not on file  Social History Narrative   Lives with husband     Vitals:   04/29/18 1136  BP: (!) 115/56  Pulse: 82  SpO2: 95%  Weight: 208 lb 9.6  oz (94.6 kg)  Height: 5\' 10"  (1.778 m)    Wt Readings from Last 3 Encounters:  04/29/18 208 lb 9.6 oz (94.6 kg)  04/06/18 204 lb 6.4 oz (92.7 kg)  02/06/18 207 lb (93.9 kg)     PHYSICAL EXAM General: NAD HEENT: Normal. Neck: No JVD, no thyromegaly. Lungs: Clear to auscultation bilaterally with normal respiratory effort. CV: Regular rate and rhythm, normal S1/S2, no S3/S4, no murmur. No pretibial or periankle edema.  No carotid bruit.   Abdomen: Soft, nontender, no distention.  Neurologic: Alert and oriented.  Psych: Normal affect. Skin: Normal. Musculoskeletal: No gross deformities.    ECG: Reviewed above under Subjective   Labs: Lab Results  Component Value Date/Time   K 4.5 04/06/2018 01:18 PM   K 3.4 (L) 08/15/2015 02:50 PM   BUN 27 04/06/2018 01:18 PM   BUN 18.8 08/15/2015 02:50 PM   CREATININE 1.10 (H) 04/06/2018 01:18 PM   CREATININE 0.9 08/15/2015 02:50 PM   ALT 32 04/06/2018 01:18 PM   ALT 27 08/15/2015 02:50 PM   HGB 11.7 04/06/2018 01:18 PM   HGB 14.5 08/15/2015 02:50 PM     Lipids: Lab Results  Component Value Date/Time   LDLCALC 160 (H) 01/23/2018 02:42 PM   LDLCALC 100 (H) 11/29/2013 12:23 PM   LDLCALC 92 10/02/2012 01:01 PM   CHOL 271 (H) 01/23/2018 02:42 PM   CHOL 158 10/02/2012 01:01 PM   TRIG 383 (H) 01/23/2018 02:42 PM   TRIG 229 (H) 11/24/2014 11:26 AM   TRIG 126 10/02/2012 01:01 PM   HDL 34 (L) 01/23/2018  02:42 PM   HDL 42 11/24/2014 11:26 AM   HDL 41 10/02/2012 01:01 PM       ASSESSMENT AND PLAN:  1. Arrhythmia: Symptomatically stable. Most recent device interrogation from September 2019 reviewed above.  There has been no evidence of atrial fibrillation thus far. It was recommended by Dr. Rayann Heman to continue Eliquis for at least 6 months during which long-term monitoring is performed.   He commented that if she does not have atrial fibrillation detected at that time, we could consider stopping Eliquis and continue long-term monitoring.   There has been no evidence of atrial fibrillation and for this reason I will discontinue Eliquis.    Given her history of CVA, I will start aspirin 81 mg daily.  2. Hypertension: Controlled on present therapy which includes amlodipine and benazepril. No changes.  3. Left MCA CVA: See #1.   Given that there has been no evidence of atrial fibrillation, I will stop Eliquis and start aspirin 81 mg. I we will also increase Crestor to 40 mg.  See discussion of lipids in #4.  4.Dyslipidemia: Lipids from July 2019 reviewed above.  LDL markedly elevated at 160.  She is on Crestor 20mg  daily.  Given her history of stroke, I will increase Crestor to 40 mg.    Disposition: Follow up 1 year.   Kate Sable, M.D., F.A.C.C.

## 2018-04-29 NOTE — Patient Instructions (Addendum)
Medication Instructions:   Stop Eliquis.   Begin Aspirin 81mg  daily.  Increase Crestor to 40mg  daily.   Continue all other medications.    Labwork: none  Testing/Procedures: none  Follow-Up: Your physician wants you to follow up in:  1 year.  You will receive a reminder letter in the mail one-two months in advance.  If you don't receive a letter, please call our office to schedule the follow up appointment   Any Other Special Instructions Will Be Listed Below (If Applicable).  If you need a refill on your cardiac medications before your next appointment, please call your pharmacy.

## 2018-05-08 LAB — CUP PACEART REMOTE DEVICE CHECK
Date Time Interrogation Session: 20191021233943
MDC IDC PG IMPLANT DT: 20181002

## 2018-05-11 ENCOUNTER — Encounter: Payer: Self-pay | Admitting: Rheumatology

## 2018-05-11 ENCOUNTER — Ambulatory Visit: Payer: Medicare HMO | Admitting: Rheumatology

## 2018-05-11 VITALS — BP 118/70 | HR 83 | Resp 12 | Ht 70.0 in | Wt 204.4 lb

## 2018-05-11 DIAGNOSIS — Z8679 Personal history of other diseases of the circulatory system: Secondary | ICD-10-CM | POA: Diagnosis not present

## 2018-05-11 DIAGNOSIS — M1612 Unilateral primary osteoarthritis, left hip: Secondary | ICD-10-CM | POA: Diagnosis not present

## 2018-05-11 DIAGNOSIS — M19042 Primary osteoarthritis, left hand: Secondary | ICD-10-CM

## 2018-05-11 DIAGNOSIS — Z8639 Personal history of other endocrine, nutritional and metabolic disease: Secondary | ICD-10-CM | POA: Diagnosis not present

## 2018-05-11 DIAGNOSIS — Z8719 Personal history of other diseases of the digestive system: Secondary | ICD-10-CM

## 2018-05-11 DIAGNOSIS — F17200 Nicotine dependence, unspecified, uncomplicated: Secondary | ICD-10-CM

## 2018-05-11 DIAGNOSIS — M818 Other osteoporosis without current pathological fracture: Secondary | ICD-10-CM | POA: Diagnosis not present

## 2018-05-11 DIAGNOSIS — Z8669 Personal history of other diseases of the nervous system and sense organs: Secondary | ICD-10-CM

## 2018-05-11 DIAGNOSIS — Z8659 Personal history of other mental and behavioral disorders: Secondary | ICD-10-CM

## 2018-05-11 DIAGNOSIS — Z96641 Presence of right artificial hip joint: Secondary | ICD-10-CM | POA: Diagnosis not present

## 2018-05-11 DIAGNOSIS — Z96653 Presence of artificial knee joint, bilateral: Secondary | ICD-10-CM | POA: Diagnosis not present

## 2018-05-11 DIAGNOSIS — E1165 Type 2 diabetes mellitus with hyperglycemia: Secondary | ICD-10-CM

## 2018-05-11 DIAGNOSIS — M19041 Primary osteoarthritis, right hand: Secondary | ICD-10-CM | POA: Diagnosis not present

## 2018-05-11 NOTE — Patient Instructions (Signed)
Fall Prevention in the Home Falls can cause injuries. They can happen to people of all ages. There are many things you can do to make your home safe and to help prevent falls. What can I do on the outside of my home?  Regularly fix the edges of walkways and driveways and fix any cracks.  Remove anything that might make you trip as you walk through a door, such as a raised step or threshold.  Trim any bushes or trees on the path to your home.  Use bright outdoor lighting.  Clear any walking paths of anything that might make someone trip, such as rocks or tools.  Regularly check to see if handrails are loose or broken. Make sure that both sides of any steps have handrails.  Any raised decks and porches should have guardrails on the edges.  Have any leaves, snow, or ice cleared regularly.  Use sand or salt on walking paths during winter.  Clean up any spills in your garage right away. This includes oil or grease spills. What can I do in the bathroom?  Use night lights.  Install grab bars by the toilet and in the tub and shower. Do not use towel bars as grab bars.  Use non-skid mats or decals in the tub or shower.  If you need to sit down in the shower, use a plastic, non-slip stool.  Keep the floor dry. Clean up any water that spills on the floor as soon as it happens.  Remove soap buildup in the tub or shower regularly.  Attach bath mats securely with double-sided non-slip rug tape.  Do not have throw rugs and other things on the floor that can make you trip. What can I do in the bedroom?  Use night lights.  Make sure that you have a light by your bed that is easy to reach.  Do not use any sheets or blankets that are too big for your bed. They should not hang down onto the floor.  Have a firm chair that has side arms. You can use this for support while you get dressed.  Do not have throw rugs and other things on the floor that can make you trip. What can I do in the  kitchen?  Clean up any spills right away.  Avoid walking on wet floors.  Keep items that you use a lot in easy-to-reach places.  If you need to reach something above you, use a strong step stool that has a grab bar.  Keep electrical cords out of the way.  Do not use floor polish or wax that makes floors slippery. If you must use wax, use non-skid floor wax.  Do not have throw rugs and other things on the floor that can make you trip. What can I do with my stairs?  Do not leave any items on the stairs.  Make sure that there are handrails on both sides of the stairs and use them. Fix handrails that are broken or loose. Make sure that handrails are as long as the stairways.  Check any carpeting to make sure that it is firmly attached to the stairs. Fix any carpet that is loose or worn.  Avoid having throw rugs at the top or bottom of the stairs. If you do have throw rugs, attach them to the floor with carpet tape.  Make sure that you have a light switch at the top of the stairs and the bottom of the stairs. If you do   not have them, ask someone to add them for you. What else can I do to help prevent falls?  Wear shoes that: ? Do not have high heels. ? Have rubber bottoms. ? Are comfortable and fit you well. ? Are closed at the toe. Do not wear sandals.  If you use a stepladder: ? Make sure that it is fully opened. Do not climb a closed stepladder. ? Make sure that both sides of the stepladder are locked into place. ? Ask someone to hold it for you, if possible.  Clearly mark and make sure that you can see: ? Any grab bars or handrails. ? First and last steps. ? Where the edge of each step is.  Use tools that help you move around (mobility aids) if they are needed. These include: ? Canes. ? Walkers. ? Scooters. ? Crutches.  Turn on the lights when you go into a dark area. Replace any light bulbs as soon as they burn out.  Set up your furniture so you have a clear path.  Avoid moving your furniture around.  If any of your floors are uneven, fix them.  If there are any pets around you, be aware of where they are.  Review your medicines with your doctor. Some medicines can make you feel dizzy. This can increase your chance of falling. Ask your doctor what other things that you can do to help prevent falls. This information is not intended to replace advice given to you by your health care provider. Make sure you discuss any questions you have with your health care provider. Document Released: 04/13/2009 Document Revised: 11/23/2015 Document Reviewed: 07/22/2014 Elsevier Interactive Patient Education  2018 [place] Exercises Hand exercises can be helpful to almost anyone. These exercises can strengthen the hands, improve flexibility and movement, and increase blood flow to the hands. These results can make work and daily tasks easier. Hand exercises can be especially helpful for people who have joint pain from arthritis or have nerve damage from overuse (carpal tunnel syndrome). These exercises can also help people who have injured a hand. Most of these hand exercises are fairly gentle stretching routines. You can do them often throughout the day. Still, it is a good idea to ask your health care provider which exercises would be best for you. Warming your hands before exercise may help to reduce stiffness. You can do this with gentle massage or by placing your hands in warm water for 15 minutes. Also, make sure you pay attention to your level of hand pain as you begin an exercise routine. Exercises Knuckle Bend Repeat this exercise 5-10 times with each hand. 1. Stand or sit with your arm, hand, and all five fingers pointed straight up. Make sure your wrist is straight. 2. Gently and slowly bend your fingers down and inward until the tips of your fingers are touching the tops of your palm. 3. Hold this position for a few seconds. 4. Extend your fingers out  to their original position, all pointing straight up again.  Finger Fan Repeat this exercise 5-10 times with each hand. 1. Hold your arm and hand out in front of you. Keep your wrist straight. 2. Squeeze your hand into a fist. 3. Hold this position for a few seconds. 4. Edison Simon out, or spread apart, your hand and fingers as much as possible, stretching every joint fully.  Tabletop Repeat this exercise 5-10 times with each hand. 1. Stand or sit with your arm, hand, and all  five fingers pointed straight up. Make sure your wrist is straight. 2. Gently and slowly bend your fingers at the knuckles where they meet the hand until your hand is making an upside-down L shape. Your fingers should form a tabletop. 3. Hold this position for a few seconds. 4. Extend your fingers out to their original position, all pointing straight up again.  Making Os Repeat this exercise 5-10 times with each hand. 1. Stand or sit with your arm, hand, and all five fingers pointed straight up. Make sure your wrist is straight. 2. Make an O shape by touching your pointer finger to your thumb. Hold for a few seconds. Then open your hand wide. 3. Repeat this motion with each finger on your hand.  Table Spread Repeat this exercise 5-10 times with each hand. 1. Place your hand on a table with your palm facing down. Make sure your wrist is straight. 2. Spread your fingers out as much as possible. Hold this position for a few seconds. 3. Slide your fingers back together again. Hold for a few seconds.  Ball Grip  Repeat this exercise 10-15 times with each hand. 1. Hold a tennis ball or another soft ball in your hand. 2. While slowly increasing pressure, squeeze the ball as hard as possible. 3. Squeeze as hard as you can for 3-5 seconds. 4. Relax and repeat.  Wrist Curls Repeat this exercise 10-15 times with each hand. 1. Sit in a chair that has armrests. 2. Hold a light weight in your hand, such as a dumbbell that  weighs 1-3 pounds (0.5-1.4 kg). Ask your health care provider what weight would be best for you. 3. Rest your hand just over the end of the chair arm with your palm facing up. 4. Gently pivot your wrist up and down while holding the weight. Do not twist your wrist from side to side.  Contact a health care provider if:  Your hand pain or discomfort gets much worse when you do an exercise.  Your hand pain or discomfort does not improve within 2 hours after you exercise. If you have any of these problems, stop doing these exercises right away. Do not do them again unless your health care provider says that you can. Get help right away if:  You develop sudden, severe hand pain. If this happens, stop doing these exercises right away. Do not do them again unless your health care provider says that you can. This information is not intended to replace advice given to you by your health care provider. Make sure you discuss any questions you have with your health care provider. Document Released: 05/29/2015 Document Revised: 11/23/2015 Document Reviewed: 12/26/2014 Elsevier Interactive Patient Education  [employer].

## 2018-05-18 DIAGNOSIS — N3020 Other chronic cystitis without hematuria: Secondary | ICD-10-CM | POA: Diagnosis not present

## 2018-05-18 DIAGNOSIS — N200 Calculus of kidney: Secondary | ICD-10-CM | POA: Diagnosis not present

## 2018-05-25 ENCOUNTER — Ambulatory Visit (INDEPENDENT_AMBULATORY_CARE_PROVIDER_SITE_OTHER): Payer: Medicare HMO

## 2018-05-25 DIAGNOSIS — I639 Cerebral infarction, unspecified: Secondary | ICD-10-CM | POA: Diagnosis not present

## 2018-05-25 DIAGNOSIS — H524 Presbyopia: Secondary | ICD-10-CM | POA: Diagnosis not present

## 2018-05-25 LAB — HM DIABETES EYE EXAM

## 2018-05-25 NOTE — Progress Notes (Signed)
Carelink Summary Report / Loop Recorder 

## 2018-06-11 ENCOUNTER — Ambulatory Visit (INDEPENDENT_AMBULATORY_CARE_PROVIDER_SITE_OTHER): Payer: Medicare HMO | Admitting: Family

## 2018-06-11 ENCOUNTER — Encounter: Payer: Self-pay | Admitting: Family

## 2018-06-11 VITALS — BP 129/56 | HR 81 | Temp 96.6°F | Ht 70.0 in | Wt 202.0 lb

## 2018-06-11 DIAGNOSIS — E781 Pure hyperglyceridemia: Secondary | ICD-10-CM | POA: Diagnosis not present

## 2018-06-11 DIAGNOSIS — F411 Generalized anxiety disorder: Secondary | ICD-10-CM | POA: Diagnosis not present

## 2018-06-11 DIAGNOSIS — E559 Vitamin D deficiency, unspecified: Secondary | ICD-10-CM

## 2018-06-11 DIAGNOSIS — I10 Essential (primary) hypertension: Secondary | ICD-10-CM

## 2018-06-11 DIAGNOSIS — M545 Low back pain, unspecified: Secondary | ICD-10-CM

## 2018-06-11 DIAGNOSIS — G8929 Other chronic pain: Secondary | ICD-10-CM | POA: Diagnosis not present

## 2018-06-11 DIAGNOSIS — Z0289 Encounter for other administrative examinations: Secondary | ICD-10-CM | POA: Insufficient documentation

## 2018-06-11 DIAGNOSIS — F17200 Nicotine dependence, unspecified, uncomplicated: Secondary | ICD-10-CM

## 2018-06-11 DIAGNOSIS — M1612 Unilateral primary osteoarthritis, left hip: Secondary | ICD-10-CM | POA: Diagnosis not present

## 2018-06-11 DIAGNOSIS — I6930 Unspecified sequelae of cerebral infarction: Secondary | ICD-10-CM

## 2018-06-11 DIAGNOSIS — E876 Hypokalemia: Secondary | ICD-10-CM | POA: Diagnosis not present

## 2018-06-11 DIAGNOSIS — F339 Major depressive disorder, recurrent, unspecified: Secondary | ICD-10-CM

## 2018-06-11 DIAGNOSIS — E785 Hyperlipidemia, unspecified: Secondary | ICD-10-CM

## 2018-06-11 DIAGNOSIS — E1165 Type 2 diabetes mellitus with hyperglycemia: Secondary | ICD-10-CM | POA: Diagnosis not present

## 2018-06-11 MED ORDER — TRAMADOL HCL 50 MG PO TABS: ORAL_TABLET | ORAL | 2 refills | Status: DC

## 2018-06-11 NOTE — Patient Instructions (Signed)
Diabetes Mellitus and Nutrition When you have diabetes (diabetes mellitus), it is very important to have healthy eating habits because your blood sugar (glucose) levels are greatly affected by what you eat and drink. Eating healthy foods in the appropriate amounts, at about the same times every day, can help you:  Control your blood glucose.  Lower your risk of heart disease.  Improve your blood pressure.  Reach or maintain a healthy weight.  Every person with diabetes is different, and each person has different needs for a meal plan. Your health care provider may recommend that you work with a diet and nutrition specialist (dietitian) to make a meal plan that is best for you. Your meal plan may vary depending on factors such as:  The calories you need.  The medicines you take.  Your weight.  Your blood glucose, blood pressure, and cholesterol levels.  Your activity level.  Other health conditions you have, such as heart or kidney disease.  How do carbohydrates affect me? Carbohydrates affect your blood glucose level more than any other type of food. Eating carbohydrates naturally increases the amount of glucose in your blood. Carbohydrate counting is a method for keeping track of how many carbohydrates you eat. Counting carbohydrates is important to keep your blood glucose at a healthy level, especially if you use insulin or take certain oral diabetes medicines. It is important to know how many carbohydrates you can safely have in each meal. This is different for every person. Your dietitian can help you calculate how many carbohydrates you should have at each meal and for snack. Foods that contain carbohydrates include:  Bread, cereal, rice, pasta, and crackers.  Potatoes and corn.  Peas, beans, and lentils.  Milk and yogurt.  Fruit and juice.  Desserts, such as cakes, cookies, ice cream, and candy.  How does alcohol affect me? Alcohol can cause a sudden decrease in blood  glucose (hypoglycemia), especially if you use insulin or take certain oral diabetes medicines. Hypoglycemia can be a life-threatening condition. Symptoms of hypoglycemia (sleepiness, dizziness, and confusion) are similar to symptoms of having too much alcohol. If your health care provider says that alcohol is safe for you, follow these guidelines:  Limit alcohol intake to no more than 1 drink per day for nonpregnant women and 2 drinks per day for men. One drink equals 12 oz of beer, 5 oz of wine, or 1 oz of hard liquor.  Do not drink on an empty stomach.  Keep yourself hydrated with water, diet soda, or unsweetened iced tea.  Keep in mind that regular soda, juice, and other mixers may contain a lot of sugar and must be counted as carbohydrates.  What are tips for following this plan? Reading food labels  Start by checking the serving size on the label. The amount of calories, carbohydrates, fats, and other nutrients listed on the label are based on one serving of the food. Many foods contain more than one serving per package.  Check the total grams (g) of carbohydrates in one serving. You can calculate the number of servings of carbohydrates in one serving by dividing the total carbohydrates by 15. For example, if a food has 30 g of total carbohydrates, it would be equal to 2 servings of carbohydrates.  Check the number of grams (g) of saturated and trans fats in one serving. Choose foods that have low or no amount of these fats.  Check the number of milligrams (mg) of sodium in one serving. Most people   should limit total sodium intake to less than 2,300 mg per day.  Always check the nutrition information of foods labeled as "low-fat" or "nonfat". These foods may be higher in added sugar or refined carbohydrates and should be avoided.  Talk to your dietitian to identify your daily goals for nutrients listed on the label. Shopping  Avoid buying canned, premade, or processed foods. These  foods tend to be high in fat, sodium, and added sugar.  Shop around the outside edge of the grocery store. This includes fresh fruits and vegetables, bulk grains, fresh meats, and fresh dairy. Cooking  Use low-heat cooking methods, such as baking, instead of high-heat cooking methods like deep frying.  Cook using healthy oils, such as olive, canola, or sunflower oil.  Avoid cooking with butter, cream, or high-fat meats. Meal planning  Eat meals and snacks regularly, preferably at the same times every day. Avoid going long periods of time without eating.  Eat foods high in fiber, such as fresh fruits, vegetables, beans, and whole grains. Talk to your dietitian about how many servings of carbohydrates you can eat at each meal.  Eat 4-6 ounces of lean protein each day, such as lean meat, chicken, fish, eggs, or tofu. 1 ounce is equal to 1 ounce of meat, chicken, or fish, 1 egg, or 1/4 cup of tofu.  Eat some foods each day that contain healthy fats, such as avocado, nuts, seeds, and fish. Lifestyle   Check your blood glucose regularly.  Exercise at least 30 minutes 5 or more days each week, or as told by your health care provider.  Take medicines as told by your health care provider.  Do not use any products that contain nicotine or tobacco, such as cigarettes and e-cigarettes. If you need help quitting, ask your health care provider.  Work with a counselor or diabetes educator to identify strategies to manage stress and any emotional and social challenges. What are some questions to ask my health care provider?  Do I need to meet with a diabetes educator?  Do I need to meet with a dietitian?  What number can I call if I have questions?  When are the best times to check my blood glucose? Where to find more information:  American Diabetes Association: diabetes.org/food-and-fitness/food  Academy of Nutrition and Dietetics:  www.eatright.org/resources/health/diseases-and-conditions/diabetes  National Institute of Diabetes and Digestive and Kidney Diseases (NIH): www.niddk.nih.gov/health-information/diabetes/overview/diet-eating-physical-activity Summary  A healthy meal plan will help you control your blood glucose and maintain a healthy lifestyle.  Working with a diet and nutrition specialist (dietitian) can help you make a meal plan that is best for you.  Keep in mind that carbohydrates and alcohol have immediate effects on your blood glucose levels. It is important to count carbohydrates and to use alcohol carefully. This information is not intended to replace advice given to you by your health care provider. Make sure you discuss any questions you have with your health care provider. Document Released: 03/14/2005 Document Revised: 07/22/2016 Document Reviewed: 07/22/2016 Elsevier Interactive Patient Education  2018 [employer] Inc[employer]  

## 2018-06-11 NOTE — Progress Notes (Signed)
Subjective:    Patient ID: Caitlin Mccarthy, female    DOB: [birth year], 82 y.o.   MRN: [id number]  Chief Complaint  Patient presents with  . pain management    refills   PT presents to the office today for chronic follow up. She is followed by Cardiologists annually late effect of CVA. She is followed by Urologists annually for frequent UTI's.  Diabetes  She presents for her follow-up diabetic visit. She has type 2 diabetes mellitus. Hypoglycemia symptoms include nervousness/anxiousness. Associated symptoms include foot paresthesias. Pertinent negatives for diabetes include no blurred vision and no foot ulcerations. Symptoms are stable. Diabetic complications include a CVA and heart disease. Risk factors for coronary artery disease include diabetes mellitus, dyslipidemia, obesity, hypertension, sedentary lifestyle and post-menopausal. She is following a generally healthy diet. Her overall blood glucose range is 140-180 mg/dl. Eye exam is current.  Hyperlipidemia  This is a chronic problem. The current episode started more than 1 year ago. The problem is uncontrolled. Recent lipid tests were reviewed and are high. Exacerbating diseases include obesity. Pertinent negatives include no shortness of breath. Current antihyperlipidemic treatment includes statins. The current treatment provides moderate improvement of lipids. Risk factors for coronary artery disease include diabetes mellitus, dyslipidemia, family history, hypertension and a sedentary lifestyle.  Hypertension  This is a chronic problem. The current episode started more than 1 year ago. The problem has been waxing and waning since onset. The problem is controlled. Associated symptoms include anxiety and malaise/fatigue. Pertinent negatives include no blurred vision, peripheral edema or shortness of breath. Risk factors for coronary artery disease include dyslipidemia, diabetes mellitus, obesity and sedentary lifestyle. The current treatment  provides moderate improvement. Hypertensive end-organ damage includes CAD/MI and CVA.  Gastroesophageal Reflux  She complains of belching and heartburn. She reports no coughing. This is a chronic problem. The current episode started more than 1 year ago. The problem occurs occasionally. The problem has been waxing and waning. She has tried a PPI for the symptoms. The treatment provided moderate relief.  Arthritis  Presents for follow-up visit. She complains of pain and stiffness. Affected locations include the right knee, left knee, right hip and left hip. Her pain is at a severity of 9/10.  Anxiety  Presents for follow-up visit. Symptoms include depressed mood, excessive worry, irritability and nervous/anxious behavior. Patient reports no shortness of breath.    Depression         This is a chronic problem.  The current episode started more than 1 year ago.   The onset quality is gradual.   The problem occurs intermittently.  Associated symptoms include irritable and sad.  Associated symptoms include no helplessness and no hopelessness.  Past medical history includes anxiety.       Review of Systems  Constitutional: Positive for irritability and malaise/fatigue.  Eyes: Negative for blurred vision.  Respiratory: Negative for cough and shortness of breath.   Gastrointestinal: Positive for heartburn.  Musculoskeletal: Positive for arthritis and stiffness.  Psychiatric/Behavioral: Positive for depression. The patient is nervous/anxious.   All other systems reviewed and are negative.      Objective:   Physical Exam Vitals signs reviewed.  Constitutional:      General: She is irritable. She is not in acute distress.    Appearance: She is well-developed.  HENT:     Head: Normocephalic and atraumatic.     Right Ear: External ear normal.  Eyes:     Pupils: Pupils are equal, round, and reactive to  light.  Neck:     Musculoskeletal: Normal range of motion and neck supple.     Thyroid: No  thyromegaly.  Cardiovascular:     Rate and Rhythm: Normal rate and regular rhythm.     Heart sounds: Normal heart sounds. No murmur.  Pulmonary:     Effort: Pulmonary effort is normal. No respiratory distress.     Breath sounds: Normal breath sounds. No wheezing.  Abdominal:     General: Bowel sounds are normal. There is no distension.     Palpations: Abdomen is soft.     Tenderness: There is no abdominal tenderness.  Musculoskeletal:        General: No tenderness.     Comments: Using rolling walker, pain in right hip with external rotation  Skin:    General: Skin is warm and dry.  Neurological:     Mental Status: She is alert and oriented to person, place, and time.     Cranial Nerves: No cranial nerve deficit.     Deep Tendon Reflexes: Reflexes are normal and symmetric.  Psychiatric:        Behavior: Behavior normal.        Thought Content: Thought content normal.        Judgment: Judgment normal.     BP (!) 129/56   Pulse 81   Temp (!) 96.6 F (35.9 C) (Oral)   Ht _0  (1.778 m)   Wt 202 lb (91.6 kg)   BMI 28.98 kg/m      Assessment & Plan:  Caitlin Mccarthy comes in today with chief complaint of pain management (refills)   Diagnosis and orders addressed:  1. Essential hypertension, benign - CMP14+EGFR - CBC with Differential/Platelet  2. Type 2 diabetes mellitus with hyperglycemia, without long-term current use of insulin (HCC) - Bayer DCA Hb A1c Waived - CMP14+EGFR - CBC with Differential/Platelet - Microalbumin / creatinine urine ratio  3. Primary osteoarthritis of left hip - CMP14+EGFR - CBC with Differential/Platelet - traMADol (ULTRAM) 50 MG tablet; TAKE 1 TO 2 TABLETS EVERY 12 HOURS AS NEEDED FOR PAIN  Dispense: 60 tablet; Refill: 2  4. Hypokalemia - CMP14+EGFR - CBC with Differential/Platelet  5. Other chronic pain - CMP14+EGFR - CBC with Differential/Platelet - traMADol (ULTRAM) 50 MG tablet; TAKE 1 TO 2 TABLETS EVERY 12 HOURS AS NEEDED  FOR PAIN  Dispense: 60 tablet; Refill: 2  6. Episode of recurrent major depressive disorder, unspecified depression episode severity ([place]) - CMP14+EGFR - CBC with Differential/Platelet  7. GAD (generalized anxiety disorder) - CMP14+EGFR - CBC with Differential/Platelet  8. Pure hypertriglyceridemia - CMP14+EGFR - CBC with Differential/Platelet - Lipid panel  9. Late effect of cerebrovascular accident (CVA) - CMP14+EGFR - CBC with Differential/Platelet  10. Vitamin D deficiency - CMP14+EGFR - CBC with Differential/Platelet - VITAMIN D 25 Hydroxy (Vit-D Deficiency, Fractures)  11. Current smoker - CMP14+EGFR - CBC with Differential/Platelet  12. Hyperlipidemia, unspecified hyperlipidemia type - CMP14+EGFR - CBC with Differential/Platelet - Lipid panel  13. Acute left-sided low back pain without sciatica - traMADol (ULTRAM) 50 MG tablet; TAKE 1 TO 2 TABLETS EVERY 12 HOURS AS NEEDED FOR PAIN  Dispense: 60 tablet; Refill: 2  14. Pain management contract signed - traMADol (ULTRAM) 50 MG tablet; TAKE 1 TO 2 TABLETS EVERY 12 HOURS AS NEEDED FOR PAIN  Dispense: 60 tablet; Refill: 2   Labs pending Health Maintenance reviewed Diet and exercise encouraged  Follow up plan: 3 month   Evelina Dun, FNP

## 2018-06-12 ENCOUNTER — Other Ambulatory Visit: Payer: Self-pay | Admitting: Family

## 2018-06-12 LAB — LIPID PANEL
CHOLESTEROL TOTAL: 154 mg/dL (ref 100–199)
Chol/HDL Ratio: 4.4 ratio (ref 0.0–4.4)
HDL: 35 mg/dL — ABNORMAL LOW (ref >39)
LDL Calculated: 61 mg/dL (ref 0–99)
Triglycerides: 291 mg/dL — ABNORMAL HIGH (ref 0–149)
VLDL Cholesterol Cal: 58 mg/dL — ABNORMAL HIGH (ref 5–40)

## 2018-06-12 LAB — CBC WITH DIFFERENTIAL/PLATELET
BASOS ABS: 0.1 10*3/uL (ref 0.0–0.2)
Basos: 1 %
EOS (ABSOLUTE): 0.2 10*3/uL (ref 0.0–0.4)
Eos: 2 %
Hematocrit: 36.4 % (ref 34.0–46.6)
Hemoglobin: 12.3 g/dL (ref 11.1–15.9)
IMMATURE GRANULOCYTES: 0 %
Immature Grans (Abs): 0 10*3/uL (ref 0.0–0.1)
LYMPHS: 36 %
Lymphocytes Absolute: 3.2 10*3/uL — ABNORMAL HIGH (ref 0.7–3.1)
MCH: 30.3 pg (ref 26.6–33.0)
MCHC: 33.8 g/dL (ref 31.5–35.7)
MCV: 90 fL (ref 79–97)
Monocytes Absolute: 0.6 10*3/uL (ref 0.1–0.9)
Monocytes: 7 %
NEUTROS ABS: 4.9 10*3/uL (ref 1.4–7.0)
NEUTROS PCT: 54 %
PLATELETS: 301 10*3/uL (ref 150–450)
RBC: 4.06 x10E6/uL (ref 3.77–5.28)
RDW: 13.8 % (ref 12.3–15.4)
WBC: 8.9 10*3/uL (ref 3.4–10.8)

## 2018-06-12 LAB — CMP14+EGFR
A/G RATIO: 1.3 (ref 1.2–2.2)
ALK PHOS: 64 IU/L (ref 39–117)
ALT: 27 IU/L (ref 0–32)
AST: 35 IU/L (ref 0–40)
Albumin: 4.4 g/dL (ref 3.5–4.8)
BILIRUBIN TOTAL: 0.3 mg/dL (ref 0.0–1.2)
BUN/Creatinine Ratio: 25 (ref 12–28)
BUN: 21 mg/dL (ref 8–27)
CHLORIDE: 97 mmol/L (ref 96–106)
CO2: 25 mmol/L (ref 20–29)
Calcium: 11.6 mg/dL — ABNORMAL HIGH (ref 8.7–10.3)
Creatinine, Ser: 0.84 mg/dL (ref 0.57–1.00)
GFR calc Af Amer: 76 mL/min/{1.73_m2} (ref >59)
GFR calc non Af Amer: 66 mL/min/{1.73_m2} (ref >59)
Globulin, Total: 3.5 g/dL (ref 1.5–4.5)
Glucose: 105 mg/dL — ABNORMAL HIGH (ref 65–99)
POTASSIUM: 4.2 mmol/L (ref 3.5–5.2)
Sodium: 138 mmol/L (ref 134–144)
Total Protein: 7.9 g/dL (ref 6.0–8.5)

## 2018-06-12 LAB — MICROALBUMIN / CREATININE URINE RATIO
Creatinine, Urine: 84.5 mg/dL
Microalb/Creat Ratio: 79.3 mg/g creat — ABNORMAL HIGH (ref 0.0–30.0)
Microalbumin, Urine: 67 ug/mL

## 2018-06-12 LAB — VITAMIN D 25 HYDROXY (VIT D DEFICIENCY, FRACTURES): Vit D, 25-Hydroxy: 10.9 ng/mL — ABNORMAL LOW (ref 30.0–100.0)

## 2018-06-12 LAB — BAYER DCA HB A1C WAIVED: HB A1C (BAYER DCA - WAIVED): 6.5 % (ref <7.0)

## 2018-06-12 MED ORDER — VITAMIN D (ERGOCALCIFEROL) 1.25 MG (50000 UNIT) PO CAPS: 50000.0000 [IU] | ORAL_CAPSULE | ORAL | 3 refills | Status: DC

## 2018-06-16 ENCOUNTER — Other Ambulatory Visit: Payer: Self-pay | Admitting: Nurse Practitioner

## 2018-06-23 ENCOUNTER — Other Ambulatory Visit: Payer: Self-pay | Admitting: Nurse Practitioner

## 2018-06-23 ENCOUNTER — Other Ambulatory Visit: Payer: Self-pay | Admitting: Family

## 2018-06-23 DIAGNOSIS — I10 Essential (primary) hypertension: Secondary | ICD-10-CM

## 2018-06-25 ENCOUNTER — Ambulatory Visit: Payer: Medicare HMO

## 2018-06-26 LAB — CUP PACEART REMOTE DEVICE CHECK
Date Time Interrogation Session: 20191227013954
Implantable Pulse Generator Implant Date: 20181002

## 2018-07-12 LAB — CUP PACEART REMOTE DEVICE CHECK
Date Time Interrogation Session: 20191124004026
Implantable Pulse Generator Implant Date: 20181002

## 2018-07-20 ENCOUNTER — Ambulatory Visit: Payer: Medicare HMO | Admitting: *Deleted

## 2018-07-23 ENCOUNTER — Other Ambulatory Visit: Payer: Self-pay | Admitting: Family

## 2018-07-28 ENCOUNTER — Ambulatory Visit (INDEPENDENT_AMBULATORY_CARE_PROVIDER_SITE_OTHER): Payer: Medicare HMO

## 2018-07-28 DIAGNOSIS — I639 Cerebral infarction, unspecified: Secondary | ICD-10-CM | POA: Diagnosis not present

## 2018-07-29 NOTE — Progress Notes (Signed)
Carelink Summary Report / Loop Recorder 

## 2018-07-30 ENCOUNTER — Telehealth: Payer: Self-pay | Admitting: Family

## 2018-07-30 LAB — CUP PACEART REMOTE DEVICE CHECK
Date Time Interrogation Session: 20200129013542
Implantable Pulse Generator Implant Date: 20181002

## 2018-07-30 NOTE — Telephone Encounter (Signed)
Apt made

## 2018-07-31 ENCOUNTER — Encounter: Payer: Self-pay | Admitting: Family

## 2018-07-31 ENCOUNTER — Ambulatory Visit (INDEPENDENT_AMBULATORY_CARE_PROVIDER_SITE_OTHER): Payer: Medicare HMO | Admitting: Family

## 2018-07-31 VITALS — BP 135/65 | HR 79 | Temp 97.1°F | Ht 70.0 in | Wt 200.0 lb

## 2018-07-31 DIAGNOSIS — R110 Nausea: Secondary | ICD-10-CM

## 2018-07-31 DIAGNOSIS — F339 Major depressive disorder, recurrent, unspecified: Secondary | ICD-10-CM | POA: Diagnosis not present

## 2018-07-31 DIAGNOSIS — R0982 Postnasal drip: Secondary | ICD-10-CM

## 2018-07-31 DIAGNOSIS — F411 Generalized anxiety disorder: Secondary | ICD-10-CM

## 2018-07-31 MED ORDER — FLUTICASONE PROPIONATE 50 MCG/ACT NA SUSP: 2.0000 | Freq: Every day | NASAL | 6 refills | Status: DC

## 2018-07-31 MED ORDER — FENOFIBRATE 145 MG PO TABS: ORAL_TABLET | ORAL | 2 refills | Status: DC

## 2018-07-31 MED ORDER — DULOXETINE HCL 60 MG PO CPEP: 60.0000 mg | ORAL_CAPSULE | Freq: Every day | ORAL | 3 refills | Status: DC

## 2018-07-31 NOTE — Progress Notes (Signed)
Subjective:    Patient ID: Caitlin Mccarthy, female    DOB: Dec 21, 1935, 84 y.o.   MRN: [id number]  Chief Complaint  Patient presents with  . Nausea    HPI Pt presents to the office today with complaints of intermittent nausea that started several months. She denies any vomiting. She thinks anxiety makes the nausea worse and reports postnasal drip. She states she gets nausea when she has had to travel out of town or when she has been out at a store by herself and was worried she was going to fall.   She uses zofran as needed and uses cold air that helps. She reports the nausea lasts for 15 mins and occurs 2-3 times a month.   She does has GERD, but taking her prilosec 20 mg.     Review of Systems  Psychiatric/Behavioral: The patient is nervous/anxious.   All other systems reviewed and are negative.      Objective:   Physical Exam Vitals signs reviewed.  Constitutional:      General: She is not in acute distress.    Appearance: She is well-developed.  HENT:     Head: Normocephalic and atraumatic.     Right Ear: Tympanic membrane normal.     Left Ear: Tympanic membrane normal.  Eyes:     Pupils: Pupils are equal, round, and reactive to light.  Neck:     Musculoskeletal: Normal range of motion and neck supple.     Thyroid: No thyromegaly.  Cardiovascular:     Rate and Rhythm: Normal rate and regular rhythm.     Heart sounds: Normal heart sounds. No murmur.  Pulmonary:     Effort: Pulmonary effort is normal. No respiratory distress.     Breath sounds: Normal breath sounds. No wheezing.  Abdominal:     General: Bowel sounds are normal. There is no distension.     Palpations: Abdomen is soft.     Tenderness: There is no abdominal tenderness.  Musculoskeletal: Normal range of motion.        General: No tenderness.  Skin:    General: Skin is warm and dry.  Neurological:     Mental Status: She is alert and oriented to person, place, and time.     Cranial Nerves: No  cranial nerve deficit.     Deep Tendon Reflexes: Reflexes are normal and symmetric.  Psychiatric:        Behavior: Behavior normal.        Thought Content: Thought content normal.        Judgment: Judgment normal.       BP 135/65   Pulse 79   Temp (!) 97.1 F (36.2 C) (Oral)   Ht 5\' 10"  (1.778 m)   Wt 200 lb (90.7 kg)   BMI 28.70 kg/m      Assessment & Plan:  Caitlin Mccarthy comes in today with chief complaint of Nausea   Diagnosis and orders addressed:  1. Episode of recurrent major depressive disorder, unspecified depression episode severity ([place]) - DULoxetine (CYMBALTA) 60 MG capsule; Take 1 capsule (60 mg total) by mouth daily.  Dispense: 90 capsule; Refill: 3  2. GAD (generalized anxiety disorder) - DULoxetine (CYMBALTA) 60 MG capsule; Take 1 capsule (60 mg total) by mouth daily.  Dispense: 90 capsule; Refill: 3  3. Postnasal drip Start Flonase every night - fluticasone (FLONASE) 50 MCG/ACT nasal spray; Place 2 sprays into both nostrils daily.  Dispense: 16 g; Refill: 6  4.  Nausea   I believe most of her symptoms are being caused by anxiety. We will increase her Cymbalta to 60 mg from 30 mg Stress management discussed RTO in 6 weeks     Evelina Dun, FNP

## 2018-07-31 NOTE — Patient Instructions (Signed)

## 2018-08-07 ENCOUNTER — Other Ambulatory Visit: Payer: Self-pay | Admitting: Family

## 2018-08-09 DIAGNOSIS — R52 Pain, unspecified: Secondary | ICD-10-CM | POA: Diagnosis not present

## 2018-08-09 DIAGNOSIS — R05 Cough: Secondary | ICD-10-CM | POA: Diagnosis not present

## 2018-08-09 DIAGNOSIS — J22 Unspecified acute lower respiratory infection: Secondary | ICD-10-CM | POA: Diagnosis not present

## 2018-08-09 DIAGNOSIS — E119 Type 2 diabetes mellitus without complications: Secondary | ICD-10-CM | POA: Diagnosis not present

## 2018-08-17 DIAGNOSIS — N200 Calculus of kidney: Secondary | ICD-10-CM | POA: Diagnosis not present

## 2018-08-17 DIAGNOSIS — N3020 Other chronic cystitis without hematuria: Secondary | ICD-10-CM | POA: Diagnosis not present

## 2018-08-31 ENCOUNTER — Ambulatory Visit (INDEPENDENT_AMBULATORY_CARE_PROVIDER_SITE_OTHER): Payer: Medicare HMO | Admitting: *Deleted

## 2018-08-31 ENCOUNTER — Other Ambulatory Visit: Payer: Self-pay | Admitting: Cardiovascular Disease

## 2018-08-31 DIAGNOSIS — I4891 Unspecified atrial fibrillation: Secondary | ICD-10-CM

## 2018-08-31 DIAGNOSIS — I639 Cerebral infarction, unspecified: Secondary | ICD-10-CM | POA: Diagnosis not present

## 2018-08-31 LAB — CUP PACEART REMOTE DEVICE CHECK
Date Time Interrogation Session: 20200302013742
Implantable Pulse Generator Implant Date: 20181002

## 2018-09-01 ENCOUNTER — Ambulatory Visit (INDEPENDENT_AMBULATORY_CARE_PROVIDER_SITE_OTHER): Payer: Medicare HMO | Admitting: Family Medicine

## 2018-09-01 ENCOUNTER — Encounter: Payer: Self-pay | Admitting: Family Medicine

## 2018-09-01 VITALS — BP 129/63 | HR 78 | Temp 97.6°F | Ht 70.0 in | Wt 198.4 lb

## 2018-09-01 DIAGNOSIS — J9801 Acute bronchospasm: Secondary | ICD-10-CM

## 2018-09-01 MED ORDER — MOXIFLOXACIN HCL 400 MG PO TABS: 400.0000 mg | ORAL_TABLET | Freq: Every day | ORAL | 0 refills | Status: DC

## 2018-09-01 MED ORDER — BETAMETHASONE SOD PHOS & ACET 6 (3-3) MG/ML IJ SUSP
6.0000 mg | Freq: Once | INTRAMUSCULAR | Status: AC
  Administered 2018-09-01: 6 mg via INTRAMUSCULAR

## 2018-09-01 MED ORDER — BENZONATATE 200 MG PO CAPS: 200.0000 mg | ORAL_CAPSULE | Freq: Three times a day (TID) | ORAL | 0 refills | Status: DC | PRN

## 2018-09-01 NOTE — Progress Notes (Signed)
Chief Complaint  Patient presents with  . Cough    pt here today c/o cough for over 2 weeks. She was seen at Urgent Care 2 weeks ago and given steroid shot and antibiotic shot and zpak    HPI  Patient presents today for cough ongoing for about 3 weeks.  It is been dry.  It is not been associated with shortness of breath or fever..  However she has had some chills intermittently.  She went to urgent care about a week into this that would be 2 weeks ago and was given a steroid shot.  She was also given some antibiotics, Z-Pak.  The cough has been persistent without any change with the exception of actually increasing during the last week.  She denies nausea vomiting diarrhea.  No body muscle ache headache or sore throat.  She did have some earache at onset.  PMH: Smoking status noted ROS: Per HPI  Objective: BP 129/63   Pulse 78   Temp 97.6 F (36.4 C) (Oral)   Ht 5\' 10"  (1.778 m)   Wt 198 lb 6 oz (90 kg)   BMI 28.46 kg/m  Gen: NAD, alert, cooperative with exam HEENT: NCAT, EOMI, PERRL CV: RRR, good S1/S2, no murmur Resp: CTABL, no wheezes, non-labored Abd: SNTND, BS present, no guarding or organomegaly Ext: No edema, warm Neuro: Alert and oriented, No gross deficits  Assessment and plan:  1. Cough due to bronchospasm     Meds ordered this encounter  Medications  . moxifloxacin (AVELOX) 400 MG tablet    Sig: Take 1 tablet (400 mg total) by mouth daily. Take all of these, for infection    Dispense:  7 tablet    Refill:  0  . betamethasone acetate-betamethasone sodium phosphate (CELESTONE) injection 6 mg  . benzonatate (TESSALON) 200 MG capsule    Sig: Take 1 capsule (200 mg total) by mouth 3 (three) times daily as needed for cough.    Dispense:  20 capsule    Refill:  0    No orders of the defined types were placed in this encounter.   Follow up as needed.  Claretta Fraise, MD

## 2018-09-07 NOTE — Progress Notes (Signed)
Carelink Summary Report / Loop Recorder 

## 2018-09-12 ENCOUNTER — Other Ambulatory Visit: Payer: Self-pay | Admitting: Family

## 2018-09-12 DIAGNOSIS — M1612 Unilateral primary osteoarthritis, left hip: Secondary | ICD-10-CM

## 2018-09-12 DIAGNOSIS — M545 Low back pain, unspecified: Secondary | ICD-10-CM

## 2018-09-12 DIAGNOSIS — Z0289 Encounter for other administrative examinations: Secondary | ICD-10-CM

## 2018-09-12 DIAGNOSIS — G8929 Other chronic pain: Secondary | ICD-10-CM

## 2018-09-14 ENCOUNTER — Other Ambulatory Visit: Payer: Self-pay

## 2018-09-14 ENCOUNTER — Ambulatory Visit (INDEPENDENT_AMBULATORY_CARE_PROVIDER_SITE_OTHER): Payer: Medicare HMO | Admitting: Family

## 2018-09-14 ENCOUNTER — Ambulatory Visit (INDEPENDENT_AMBULATORY_CARE_PROVIDER_SITE_OTHER): Payer: Medicare HMO

## 2018-09-14 ENCOUNTER — Encounter: Payer: Self-pay | Admitting: Family

## 2018-09-14 VITALS — BP 120/63 | HR 75 | Temp 97.0°F | Ht 70.0 in | Wt 199.2 lb

## 2018-09-14 DIAGNOSIS — F339 Major depressive disorder, recurrent, unspecified: Secondary | ICD-10-CM | POA: Diagnosis not present

## 2018-09-14 DIAGNOSIS — F17200 Nicotine dependence, unspecified, uncomplicated: Secondary | ICD-10-CM | POA: Diagnosis not present

## 2018-09-14 DIAGNOSIS — F411 Generalized anxiety disorder: Secondary | ICD-10-CM

## 2018-09-14 DIAGNOSIS — G8929 Other chronic pain: Secondary | ICD-10-CM

## 2018-09-14 DIAGNOSIS — K219 Gastro-esophageal reflux disease without esophagitis: Secondary | ICD-10-CM

## 2018-09-14 DIAGNOSIS — E663 Overweight: Secondary | ICD-10-CM

## 2018-09-14 DIAGNOSIS — Z0289 Encounter for other administrative examinations: Secondary | ICD-10-CM

## 2018-09-14 DIAGNOSIS — J209 Acute bronchitis, unspecified: Secondary | ICD-10-CM

## 2018-09-14 DIAGNOSIS — I6930 Unspecified sequelae of cerebral infarction: Secondary | ICD-10-CM | POA: Diagnosis not present

## 2018-09-14 DIAGNOSIS — M545 Low back pain, unspecified: Secondary | ICD-10-CM

## 2018-09-14 DIAGNOSIS — Z79891 Long term (current) use of opiate analgesic: Secondary | ICD-10-CM | POA: Diagnosis not present

## 2018-09-14 DIAGNOSIS — M1612 Unilateral primary osteoarthritis, left hip: Secondary | ICD-10-CM | POA: Diagnosis not present

## 2018-09-14 DIAGNOSIS — E1165 Type 2 diabetes mellitus with hyperglycemia: Secondary | ICD-10-CM | POA: Diagnosis not present

## 2018-09-14 DIAGNOSIS — I10 Essential (primary) hypertension: Secondary | ICD-10-CM | POA: Diagnosis not present

## 2018-09-14 DIAGNOSIS — R05 Cough: Secondary | ICD-10-CM | POA: Diagnosis not present

## 2018-09-14 DIAGNOSIS — E781 Pure hyperglyceridemia: Secondary | ICD-10-CM

## 2018-09-14 DIAGNOSIS — R059 Cough, unspecified: Secondary | ICD-10-CM

## 2018-09-14 LAB — BAYER DCA HB A1C WAIVED: HB A1C: 6.3 % (ref <7.0)

## 2018-09-14 MED ORDER — TRAMADOL HCL 50 MG PO TABS: ORAL_TABLET | ORAL | 2 refills | Status: DC

## 2018-09-14 NOTE — Progress Notes (Signed)
Subjective:    Patient ID: Caitlin Mccarthy, female    DOB: Nov 09, 1935, 83 y.o.   MRN: [id number]  Chief Complaint  Patient presents with   Medical Management of Chronic Issues   Cough   PT presents to the office today for chronic follow up. She is followed by Urologists annually for frequent UTI's.PT states she had a CVA 2018.   Hypertension  This is a chronic problem. The current episode started more than 1 year ago. The problem has been resolved since onset. The problem is controlled. Associated symptoms include anxiety. Pertinent negatives include no blurred vision, chest pain, malaise/fatigue, peripheral edema or shortness of breath. Risk factors for coronary artery disease include dyslipidemia, obesity, sedentary lifestyle and smoking/tobacco exposure. The current treatment provides moderate improvement. Hypertensive end-organ damage includes CAD/MI and CVA.  Gastroesophageal Reflux  She reports no chest pain, no coughing or no heartburn. This is a chronic problem. The current episode started more than 1 year ago. The problem occurs occasionally. The problem has been waxing and waning. The symptoms are aggravated by certain foods. She has tried a PPI for the symptoms. The treatment provided moderate relief.  Diabetes  She presents for her follow-up diabetic visit. She has type 2 diabetes mellitus. Her disease course has been stable. Hypoglycemia symptoms include nervousness/anxiousness. Associated symptoms include foot paresthesias. Pertinent negatives for diabetes include no blurred vision, no chest pain and no visual change. Symptoms are stable. Diabetic complications include a CVA, heart disease and nephropathy. Risk factors for coronary artery disease include dyslipidemia, diabetes mellitus, hypertension and sedentary lifestyle. Her overall blood glucose range is 90-110 mg/dl. Eye exam is current.  Arthritis  Presents for follow-up visit. She complains of pain and stiffness. The  symptoms have been stable. Affected locations include the left hip, right hip, left knee and right knee. Her pain is at a severity of 9/10.  Hyperlipidemia  This is a chronic problem. The current episode started more than 1 year ago. The problem is controlled. Recent lipid tests were reviewed and are normal. Exacerbating diseases include obesity. Pertinent negatives include no chest pain or shortness of breath. Current antihyperlipidemic treatment includes statins. The current treatment provides moderate improvement of lipids. Risk factors for coronary artery disease include dyslipidemia, diabetes mellitus, hypertension and a sedentary lifestyle.  Anxiety  Presents for follow-up visit. Symptoms include depressed mood, excessive worry, irritability, nervous/anxious behavior and restlessness. Patient reports no chest pain or shortness of breath. Symptoms occur occasionally. The severity of symptoms is moderate.    Depression         This is a chronic problem.  The current episode started more than 1 year ago.   The onset quality is gradual.   The problem occurs intermittently.  Associated symptoms include irritable, restlessness and sad.  Associated symptoms include no helplessness and no hopelessness.  Past treatments include SNRIs - Serotonin and norepinephrine reuptake inhibitors.  Past medical history includes anxiety.   Cough  This is a recurrent problem. The current episode started more than 1 month ago. The problem has been waxing and waning. The problem occurs every few minutes. The cough is non-productive. Pertinent negatives include no chest pain, heartburn or shortness of breath. She has tried oral steroids and rest for the symptoms. Her past medical history is significant for pneumonia.      Review of Systems  Constitutional: Positive for irritability. Negative for malaise/fatigue.  Eyes: Negative for blurred vision.  Respiratory: Negative for cough and shortness of breath.  Cardiovascular: Negative for chest pain.  Gastrointestinal: Negative for heartburn.  Musculoskeletal: Positive for arthritis and stiffness.  Psychiatric/Behavioral: Positive for depression. The patient is nervous/anxious.   All other systems reviewed and are negative.      Objective:   Physical Exam Vitals signs reviewed.  Constitutional:      General: She is irritable. She is not in acute distress.    Appearance: She is well-developed.  HENT:     Head: Normocephalic and atraumatic.     Right Ear: Tympanic membrane normal.     Left Ear: Tympanic membrane normal.  Eyes:     Pupils: Pupils are equal, round, and reactive to light.  Neck:     Musculoskeletal: Normal range of motion and neck supple.     Thyroid: No thyromegaly.  Cardiovascular:     Rate and Rhythm: Normal rate and regular rhythm.     Heart sounds: Normal heart sounds. No murmur.  Pulmonary:     Effort: Pulmonary effort is normal. No respiratory distress.     Breath sounds: Normal breath sounds. No wheezing.     Comments: Intermittent nonproductive cough Abdominal:     General: Bowel sounds are normal. There is no distension.     Palpations: Abdomen is soft.     Tenderness: There is no abdominal tenderness.  Musculoskeletal:        General: No tenderness.     Comments: Using rolling walker, generalized weakness  Skin:    General: Skin is warm and dry.  Neurological:     Mental Status: She is alert and oriented to person, place, and time.     Cranial Nerves: No cranial nerve deficit.     Deep Tendon Reflexes: Reflexes are normal and symmetric.  Psychiatric:        Behavior: Behavior normal.        Thought Content: Thought content normal.        Judgment: Judgment normal.     BP 120/63    Pulse 75    Temp (!) 97 F (36.1 C) (Oral)    Ht _0  (1.778 m)    Wt 199 lb 3.2 oz (90.4 kg)    BMI 28.58 kg/m      Assessment & Plan:  Caitlin Mccarthy comes in today with chief complaint of Medical Management  of Chronic Issues and Cough   Diagnosis and orders addressed:  1. Essential hypertension, benign - CMP14+EGFR - CBC with Differential/Platelet  2. Gastroesophageal reflux disease without esophagitis - CMP14+EGFR - CBC with Differential/Platelet  3. Type 2 diabetes mellitus with hyperglycemia, without long-term current use of insulin (HCC) - CMP14+EGFR - CBC with Differential/Platelet - Bayer DCA Hb A1c Waived  4. Primary osteoarthritis of left hip - CMP14+EGFR - CBC with Differential/Platelet - traMADol (ULTRAM) 50 MG tablet; TAKE 1 TO 2 TABLETS EVERY 12 HOURS AS NEEDED FOR PAIN  Dispense: 60 tablet; Refill: 2  5. Current smoker - CMP14+EGFR - CBC with Differential/Platelet  6. Episode of recurrent major depressive disorder, unspecified depression episode severity ([place]) - CMP14+EGFR - CBC with Differential/Platelet  7. GAD (generalized anxiety disorder) - CMP14+EGFR - CBC with Differential/Platelet  8. Pure hypertriglyceridemia - CMP14+EGFR - CBC with Differential/Platelet - Lipid panel  9. Late effect of cerebrovascular accident (CVA) - CMP14+EGFR - CBC with Differential/Platelet - Lipid panel  10. Pain management contract signed PT reviewed in [place] controlled database- No red flags - CMP14+EGFR - CBC with Differential/Platelet - traMADol (ULTRAM) 50 MG tablet; TAKE 1 TO  2 TABLETS EVERY 12 HOURS AS NEEDED FOR PAIN  Dispense: 60 tablet; Refill: 2 - ToxASSURE Select 13 (MW), Urine  11. Overweight (BMI 25.0-29.9) - CMP14+EGFR - CBC with Differential/Platelet  12. Cough - CMP14+EGFR - CBC with Differential/Platelet  13. Acute bronchitis, unspecified organism - CMP14+EGFR - CBC with Differential/Platelet - DG Chest 2 View; Future  14. Acute left-sided low back pain without sciatica  15. Other chronic pain   Labs pending Health Maintenance reviewed Diet and exercise encouraged  Follow up plan: 3 months    Evelina Dun, FNP

## 2018-09-14 NOTE — Patient Instructions (Signed)
Cough, Adult  Coughing is a reflex that clears your throat and your airways. Coughing helps to heal and protect your lungs. It is normal to cough occasionally, but a cough that happens with other symptoms or lasts a long time may be a sign of a condition that needs treatment. A cough may last only 2-3 weeks (acute), or it may last longer than 8 weeks (chronic). What are the causes? Coughing is commonly caused by:  Breathing in substances that irritate your lungs.  A viral or bacterial respiratory infection.  Allergies.  Asthma.  Postnasal drip.  Smoking.  Acid backing up from the stomach into the esophagus (gastroesophageal reflux).  Certain medicines.  Chronic lung problems, including COPD (or rarely, lung cancer).  Other medical conditions such as heart failure. Follow these instructions at home: Pay attention to any changes in your symptoms. Take these actions to help with your discomfort:  Take medicines only as told by your health care provider. ? If you were prescribed an antibiotic medicine, take it as told by your health care provider. Do not stop taking the antibiotic even if you start to feel better. ? Talk with your health care provider before you take a cough suppressant medicine.  Drink enough fluid to keep your urine clear or pale yellow.  If the air is dry, use a cold steam vaporizer or humidifier in your bedroom or your home to help loosen secretions.  Avoid anything that causes you to cough at work or at home.  If your cough is worse at night, try sleeping in a semi-upright position.  Avoid cigarette smoke. If you smoke, quit smoking. If you need help quitting, ask your health care provider.  Avoid caffeine.  Avoid alcohol.  Rest as needed. Contact a health care provider if:  You have new symptoms.  You cough up pus.  Your cough does not get better after 2-3 weeks, or your cough gets worse.  You cannot control your cough with suppressant  medicines and you are losing sleep.  You develop pain that is getting worse or pain that is not controlled with pain medicines.  You have a fever.  You have unexplained weight loss.  You have night sweats. Get help right away if:  You cough up blood.  You have difficulty breathing.  Your heartbeat is very fast. This information is not intended to replace advice given to you by your health care provider. Make sure you discuss any questions you have with your health care provider. Document Released: 12/14/2010 Document Revised: 11/23/2015 Document Reviewed: 08/24/2014 Elsevier Interactive Patient Education  2019 [employer] Inc.  

## 2018-09-15 ENCOUNTER — Other Ambulatory Visit: Payer: Self-pay | Admitting: *Deleted

## 2018-09-15 ENCOUNTER — Other Ambulatory Visit: Payer: Self-pay | Admitting: Family

## 2018-09-15 LAB — CMP14+EGFR
ALT: 23 IU/L (ref 0–32)
AST: 32 IU/L (ref 0–40)
Albumin/Globulin Ratio: 1.6 (ref 1.2–2.2)
Albumin: 4.8 g/dL — ABNORMAL HIGH (ref 3.7–4.7)
Alkaline Phosphatase: 55 IU/L (ref 39–117)
BUN/Creatinine Ratio: 23 (ref 12–28)
BUN: 23 mg/dL (ref 8–27)
Bilirubin Total: 0.3 mg/dL (ref 0.0–1.2)
CO2: 24 mmol/L (ref 20–29)
CREATININE: 1 mg/dL (ref 0.57–1.00)
Calcium: 12.4 mg/dL — ABNORMAL HIGH (ref 8.7–10.3)
Chloride: 100 mmol/L (ref 96–106)
GFR calc Af Amer: 61 mL/min/{1.73_m2} (ref >59)
GFR calc non Af Amer: 53 mL/min/{1.73_m2} — ABNORMAL LOW (ref >59)
Globulin, Total: 3 g/dL (ref 1.5–4.5)
Glucose: 87 mg/dL (ref 65–99)
Potassium: 4.4 mmol/L (ref 3.5–5.2)
Sodium: 139 mmol/L (ref 134–144)
TOTAL PROTEIN: 7.8 g/dL (ref 6.0–8.5)

## 2018-09-15 LAB — LIPID PANEL
Chol/HDL Ratio: 3.7 ratio (ref 0.0–4.4)
Cholesterol, Total: 155 mg/dL (ref 100–199)
HDL: 42 mg/dL (ref >39)
LDL Calculated: 61 mg/dL (ref 0–99)
Triglycerides: 259 mg/dL — ABNORMAL HIGH (ref 0–149)
VLDL CHOLESTEROL CAL: 52 mg/dL — AB (ref 5–40)

## 2018-09-15 LAB — CBC WITH DIFFERENTIAL/PLATELET
BASOS: 1 %
Basophils Absolute: 0.1 10*3/uL (ref 0.0–0.2)
EOS (ABSOLUTE): 0.1 10*3/uL (ref 0.0–0.4)
Eos: 1 %
HEMATOCRIT: 40.8 % (ref 34.0–46.6)
HEMOGLOBIN: 13.4 g/dL (ref 11.1–15.9)
Immature Grans (Abs): 0 10*3/uL (ref 0.0–0.1)
Immature Granulocytes: 0 %
Lymphocytes Absolute: 3 10*3/uL (ref 0.7–3.1)
Lymphs: 43 %
MCH: 30 pg (ref 26.6–33.0)
MCHC: 32.8 g/dL (ref 31.5–35.7)
MCV: 91 fL (ref 79–97)
MONOCYTES: 9 %
Monocytes Absolute: 0.6 10*3/uL (ref 0.1–0.9)
Neutrophils Absolute: 3.1 10*3/uL (ref 1.4–7.0)
Neutrophils: 46 %
Platelets: 236 10*3/uL (ref 150–450)
RBC: 4.47 x10E6/uL (ref 3.77–5.28)
RDW: 13.2 % (ref 11.7–15.4)
WBC: 6.9 10*3/uL (ref 3.4–10.8)

## 2018-09-18 LAB — TOXASSURE SELECT 13 (MW), URINE

## 2018-09-22 ENCOUNTER — Other Ambulatory Visit: Payer: Self-pay | Admitting: Family

## 2018-10-02 ENCOUNTER — Ambulatory Visit (INDEPENDENT_AMBULATORY_CARE_PROVIDER_SITE_OTHER): Payer: Medicare HMO | Admitting: *Deleted

## 2018-10-02 ENCOUNTER — Other Ambulatory Visit: Payer: Self-pay

## 2018-10-02 DIAGNOSIS — I639 Cerebral infarction, unspecified: Secondary | ICD-10-CM | POA: Diagnosis not present

## 2018-10-03 LAB — CUP PACEART REMOTE DEVICE CHECK
Date Time Interrogation Session: 20200404020626
Implantable Pulse Generator Implant Date: 20181002

## 2018-10-08 NOTE — Progress Notes (Signed)
Carelink Summary Report / Loop Recorder 

## 2018-11-02 ENCOUNTER — Other Ambulatory Visit: Payer: Self-pay | Admitting: Nurse Practitioner

## 2018-11-11 ENCOUNTER — Ambulatory Visit (INDEPENDENT_AMBULATORY_CARE_PROVIDER_SITE_OTHER): Payer: Medicare HMO | Admitting: *Deleted

## 2018-11-11 ENCOUNTER — Other Ambulatory Visit: Payer: Self-pay

## 2018-11-11 DIAGNOSIS — I639 Cerebral infarction, unspecified: Secondary | ICD-10-CM

## 2018-11-11 DIAGNOSIS — I4891 Unspecified atrial fibrillation: Secondary | ICD-10-CM

## 2018-11-11 LAB — CUP PACEART REMOTE DEVICE CHECK
Date Time Interrogation Session: 20200513074242
Implantable Pulse Generator Implant Date: 20181002

## 2018-11-30 NOTE — Progress Notes (Signed)
Carelink Summary Report / Loop Recorder 

## 2018-12-03 DIAGNOSIS — M25561 Pain in right knee: Secondary | ICD-10-CM | POA: Diagnosis not present

## 2018-12-03 DIAGNOSIS — Z96641 Presence of right artificial hip joint: Secondary | ICD-10-CM | POA: Diagnosis not present

## 2018-12-03 DIAGNOSIS — Z96652 Presence of left artificial knee joint: Secondary | ICD-10-CM | POA: Diagnosis not present

## 2018-12-03 DIAGNOSIS — Z96653 Presence of artificial knee joint, bilateral: Secondary | ICD-10-CM | POA: Diagnosis not present

## 2018-12-03 DIAGNOSIS — Z96651 Presence of right artificial knee joint: Secondary | ICD-10-CM | POA: Diagnosis not present

## 2018-12-03 DIAGNOSIS — Z471 Aftercare following joint replacement surgery: Secondary | ICD-10-CM | POA: Diagnosis not present

## 2018-12-03 DIAGNOSIS — M25562 Pain in left knee: Secondary | ICD-10-CM | POA: Diagnosis not present

## 2018-12-14 ENCOUNTER — Ambulatory Visit (INDEPENDENT_AMBULATORY_CARE_PROVIDER_SITE_OTHER): Payer: Medicare HMO | Admitting: *Deleted

## 2018-12-14 ENCOUNTER — Other Ambulatory Visit: Payer: Self-pay | Admitting: Family

## 2018-12-14 ENCOUNTER — Other Ambulatory Visit: Payer: Self-pay | Admitting: Cardiovascular Disease

## 2018-12-14 DIAGNOSIS — M1612 Unilateral primary osteoarthritis, left hip: Secondary | ICD-10-CM

## 2018-12-14 DIAGNOSIS — E785 Hyperlipidemia, unspecified: Secondary | ICD-10-CM

## 2018-12-14 DIAGNOSIS — Z0289 Encounter for other administrative examinations: Secondary | ICD-10-CM

## 2018-12-14 DIAGNOSIS — I639 Cerebral infarction, unspecified: Secondary | ICD-10-CM

## 2018-12-14 LAB — CUP PACEART REMOTE DEVICE CHECK
Date Time Interrogation Session: 20200615094153
Implantable Pulse Generator Implant Date: 20181002

## 2018-12-15 ENCOUNTER — Ambulatory Visit (INDEPENDENT_AMBULATORY_CARE_PROVIDER_SITE_OTHER): Payer: Medicare HMO | Admitting: Family

## 2018-12-15 ENCOUNTER — Other Ambulatory Visit: Payer: Self-pay

## 2018-12-15 ENCOUNTER — Encounter: Payer: Self-pay | Admitting: Family

## 2018-12-15 DIAGNOSIS — F339 Major depressive disorder, recurrent, unspecified: Secondary | ICD-10-CM | POA: Diagnosis not present

## 2018-12-15 DIAGNOSIS — F17200 Nicotine dependence, unspecified, uncomplicated: Secondary | ICD-10-CM | POA: Diagnosis not present

## 2018-12-15 DIAGNOSIS — M1612 Unilateral primary osteoarthritis, left hip: Secondary | ICD-10-CM | POA: Diagnosis not present

## 2018-12-15 DIAGNOSIS — E1165 Type 2 diabetes mellitus with hyperglycemia: Secondary | ICD-10-CM

## 2018-12-15 DIAGNOSIS — E781 Pure hyperglyceridemia: Secondary | ICD-10-CM

## 2018-12-15 DIAGNOSIS — I10 Essential (primary) hypertension: Secondary | ICD-10-CM | POA: Diagnosis not present

## 2018-12-15 DIAGNOSIS — F411 Generalized anxiety disorder: Secondary | ICD-10-CM | POA: Diagnosis not present

## 2018-12-15 DIAGNOSIS — K219 Gastro-esophageal reflux disease without esophagitis: Secondary | ICD-10-CM | POA: Diagnosis not present

## 2018-12-15 NOTE — Progress Notes (Signed)
Virtual Visit via telephone Note  Attempted to call patient at 10:39 AM, her daughter answered and told me to to try her mother's house number. I have attempted multiple times at 10:40AM, 10:55AM, and 11:00AMand only get a busy signal.   I connected with [name] on 12/15/18 at 11:33 AM by telephone and verified that I am speaking with the correct person using two identifiers. Caitlin Mccarthy is currently located at home and  daughter is currently with her during visit. The provider, Evelina Dun, FNP is located in their office at time of visit.  I discussed the limitations, risks, security and privacy concerns of performing an evaluation and management service by telephone and the availability of in person appointments. I also discussed with the patient that there may be a patient responsible charge related to this service. The patient expressed understanding and agreed to proceed.   History and Present Illness:  PT calls the office chronic follow up. She is followed by Urologists annually for frequent UTI's.PT states she had a CVA 2018.  Diabetes She presents for her follow-up diabetic visit. She has type 2 diabetes mellitus. Her disease course has been stable. Hypoglycemia symptoms include nervousness/anxiousness. Associated symptoms include fatigue and foot paresthesias. Pertinent negatives for diabetes include no blurred vision. There are no hypoglycemic complications. Symptoms are stable. Diabetic complications include a CVA, heart disease and peripheral neuropathy. Risk factors for coronary artery disease include dyslipidemia, diabetes mellitus, hypertension, sedentary lifestyle and post-menopausal. Her overall blood glucose range is 130-140 mg/dl. Eye exam is current.  Hypertension This is a chronic problem. The current episode started more than 1 year ago. The problem is controlled. Associated symptoms include anxiety, malaise/fatigue and shortness of breath. Pertinent  negatives include no blurred vision or peripheral edema. Risk factors for coronary artery disease include dyslipidemia, diabetes mellitus, obesity, sedentary lifestyle and smoking/tobacco exposure. The current treatment provides mild improvement. Hypertensive end-organ damage includes CVA.  Hyperlipidemia This is a chronic problem. The current episode started more than 1 year ago. The problem is controlled. Recent lipid tests were reviewed and are normal. Exacerbating diseases include obesity. Associated symptoms include shortness of breath. Current antihyperlipidemic treatment includes statins. The current treatment provides moderate improvement of lipids. Risk factors for coronary artery disease include dyslipidemia, diabetes mellitus, hypertension, post-menopausal and a sedentary lifestyle.  Depression        This is a chronic problem.  The current episode started more than 1 year ago.   The onset quality is gradual.   The problem occurs intermittently.  Associated symptoms include fatigue, irritable and restlessness.  Associated symptoms include not sad.  Past medical history includes anxiety.   Anxiety Presents for follow-up visit. Symptoms include excessive worry, irritability, nervous/anxious behavior, restlessness and shortness of breath. Symptoms occur most days.    Gastroesophageal Reflux She complains of belching and a hoarse voice. This is a chronic problem. The current episode started more than 1 year ago. The problem occurs occasionally. The problem has been waxing and waning. Associated symptoms include fatigue. She has tried a PPI for the symptoms. The treatment provided moderate relief.  Arthritis Presents for follow-up visit. She complains of pain and stiffness. Affected locations include the right knee and left knee. Her pain is at a severity of 9/10. Associated symptoms include fatigue.      Review of Systems  Constitutional: Positive for fatigue, irritability and  malaise/fatigue.  HENT: Positive for hoarse voice.   Eyes: Negative for blurred vision.  Respiratory:  Positive for shortness of breath.   Musculoskeletal: Positive for arthritis, joint pain and stiffness.  Psychiatric/Behavioral: Positive for depression. The patient is nervous/anxious.   All other systems reviewed and are negative.    Observations/Objective: No SOB or distress noted  Assessment and Plan: DEAMBER BUCKHALTER Mccarthy in today with chief complaint of No chief complaint on file.   Diagnosis and orders addressed:  1. Essential hypertension, benign - CMP14+EGFR - CBC with Differential/Platelet  2. Gastroesophageal reflux disease without esophagitis - CMP14+EGFR - CBC with Differential/Platelet  3. Type 2 diabetes mellitus with hyperglycemia, without long-term current use of insulin (HCC) - CMP14+EGFR - CBC with Differential/Platelet - Bayer DCA Hb A1c Waived  4. Primary osteoarthritis of left hip - CMP14+EGFR - CBC with Differential/Platelet  5. Pure hypertriglyceridemia - CMP14+EGFR - CBC with Differential/Platelet - Lipid panel  6. GAD (generalized anxiety disorder) - CMP14+EGFR - CBC with Differential/Platelet  7. Episode of recurrent major depressive disorder, unspecified depression episode severity ([place]) - CMP14+EGFR - CBC with Differential/Platelet  8. Current smoker - CMP14+EGFR - CBC with Differential/Platelet   Labs pending Health Maintenance reviewed Diet and exercise encouraged  Follow up plan: 3 month      I discussed the assessment and treatment plan with the patient. The patient was provided an opportunity to ask questions and all were answered. The patient agreed with the plan and demonstrated an understanding of the instructions.   The patient was advised to call back or seek an in-person evaluation if the symptoms worsen or if the condition fails to improve as anticipated.  The above assessment and management plan was  discussed with the patient. The patient verbalized understanding of and has agreed to the management plan. Patient is aware to call the clinic if symptoms persist or worsen. Patient is aware when to return to the clinic for a follow-up visit. Patient educated on when it is appropriate to go to the emergency department.   Time call ended:  11:50 AM  I provided 17 minutes of non-face-to-face time during this encounter.    Evelina Dun, FNP   Verbal

## 2018-12-18 ENCOUNTER — Other Ambulatory Visit: Payer: Self-pay

## 2018-12-18 ENCOUNTER — Other Ambulatory Visit: Payer: Medicare HMO

## 2018-12-18 DIAGNOSIS — E875 Hyperkalemia: Secondary | ICD-10-CM

## 2018-12-19 LAB — PTH, INTACT AND CALCIUM: PTH: 54 pg/mL (ref 15–65)

## 2018-12-19 LAB — BMP8+EGFR
BUN/Creatinine Ratio: 26 (ref 12–28)
BUN: 24 mg/dL (ref 8–27)
CO2: 25 mmol/L (ref 20–29)
Calcium: 11.8 mg/dL — ABNORMAL HIGH (ref 8.7–10.3)
Chloride: 99 mmol/L (ref 96–106)
Creatinine, Ser: 0.93 mg/dL (ref 0.57–1.00)
GFR calc Af Amer: 67 mL/min/{1.73_m2} (ref >59)
GFR calc non Af Amer: 58 mL/min/{1.73_m2} — ABNORMAL LOW (ref >59)
Glucose: 127 mg/dL — ABNORMAL HIGH (ref 65–99)
Potassium: 4.7 mmol/L (ref 3.5–5.2)
Sodium: 135 mmol/L (ref 134–144)

## 2018-12-19 LAB — CALCIUM, IONIZED: Calcium, Ion: 5.9 mg/dL — ABNORMAL HIGH (ref 4.5–5.6)

## 2018-12-22 NOTE — Progress Notes (Signed)
Carelink Summary Report / Loop Recorder 

## 2018-12-23 DIAGNOSIS — N200 Calculus of kidney: Secondary | ICD-10-CM | POA: Diagnosis not present

## 2018-12-23 DIAGNOSIS — N3020 Other chronic cystitis without hematuria: Secondary | ICD-10-CM | POA: Diagnosis not present

## 2018-12-23 DIAGNOSIS — N952 Postmenopausal atrophic vaginitis: Secondary | ICD-10-CM | POA: Diagnosis not present

## 2018-12-25 ENCOUNTER — Other Ambulatory Visit: Payer: Self-pay | Admitting: Family

## 2018-12-25 DIAGNOSIS — I10 Essential (primary) hypertension: Secondary | ICD-10-CM

## 2018-12-29 ENCOUNTER — Other Ambulatory Visit: Payer: Self-pay | Admitting: Family

## 2018-12-29 DIAGNOSIS — R0602 Shortness of breath: Secondary | ICD-10-CM

## 2018-12-29 DIAGNOSIS — Z122 Encounter for screening for malignant neoplasm of respiratory organs: Secondary | ICD-10-CM

## 2018-12-31 ENCOUNTER — Other Ambulatory Visit: Payer: Self-pay

## 2018-12-31 ENCOUNTER — Ambulatory Visit (INDEPENDENT_AMBULATORY_CARE_PROVIDER_SITE_OTHER): Payer: Medicare HMO

## 2018-12-31 ENCOUNTER — Encounter: Payer: Self-pay | Admitting: Physician Assistant

## 2018-12-31 ENCOUNTER — Ambulatory Visit (INDEPENDENT_AMBULATORY_CARE_PROVIDER_SITE_OTHER): Payer: Medicare HMO | Admitting: Physician Assistant

## 2018-12-31 VITALS — BP 139/58 | HR 80 | Temp 97.7°F | Ht 70.0 in

## 2018-12-31 DIAGNOSIS — S92404A Nondisplaced unspecified fracture of right great toe, initial encounter for closed fracture: Secondary | ICD-10-CM | POA: Diagnosis not present

## 2018-12-31 DIAGNOSIS — M79671 Pain in right foot: Secondary | ICD-10-CM

## 2018-12-31 DIAGNOSIS — S92414A Nondisplaced fracture of proximal phalanx of right great toe, initial encounter for closed fracture: Secondary | ICD-10-CM | POA: Diagnosis not present

## 2019-01-04 ENCOUNTER — Encounter: Payer: Self-pay | Admitting: Physician Assistant

## 2019-01-04 ENCOUNTER — Telehealth: Payer: Self-pay | Admitting: Physician Assistant

## 2019-01-04 NOTE — Telephone Encounter (Signed)
Caitlin Mccarthy aware referral has already been changed, states she has an orthopedic going to try to get it with them

## 2019-01-04 NOTE — Progress Notes (Signed)
BP (!) 139/58   Pulse 80   Temp 97.7 F (36.5 C) (Oral)   Ht 5\' 10"  (1.778 m)   BMI 28.58 kg/m    Subjective:    Patient ID: Caitlin Mccarthy, female    DOB: [birth year], 83 y.o.   MRN: [id number]  HPI: Caitlin Mccarthy is a 83 y.o. female presenting on 12/31/2018 for Foot Pain (right )  The patient had an injury to her right foot.  It landed across the top of the foot and she has had a lot of pain and swelling since then.  There is some bruising.  She does her to weight-bear.  She has never had trauma before.  She is walking with a walker.  And trying to wear wide open shoes.  X-ray was performed today and final reading did show very small fracture in the first meta tarsal, nondisplaced.  There were other wounds in the metatarsals also nondisplaced.  She was called on 01/01/2019 to let her know to have as little weightbearing as possible and an appointment with orthopedics will be made.  Also a orthopedic shoe can be called into the pharmacy until she gets to orthopedics.  Past Medical History:  Diagnosis Date  . Anxiety   . Depression   . GERD (gastroesophageal reflux disease)   . History of acute pyelonephritis    last episode 04-13-2015  w/ sepsis  . History of kidney stones   . History of recurrent UTIs    MULTIPLE  . Hyperlipidemia   . Hypertension   . OA (osteoarthritis)    knees, hips, hands  . Osteoporosis   . Poor memory    especially when has uti  . Renal calculus, left   . Stroke ([place])   . Type 2 diabetes mellitus ([place])   . Vitamin D deficiency 05/10/2016   Relevant past medical, surgical, family and social history reviewed and updated as indicated. Interim medical history since our last visit reviewed. Allergies and medications reviewed and updated. DATA REVIEWED: CHART IN EPIC  Family History reviewed for pertinent findings.  Review of Systems  Constitutional: Negative.   HENT: Negative.   Eyes: Negative.   Respiratory: Negative.   Gastrointestinal:  Negative.   Genitourinary: Negative.   Musculoskeletal: Positive for arthralgias, gait problem and joint swelling.    Allergies as of 12/31/2018      Reactions   Iodinated Diagnostic Agents    Latex       Medication List       Accurate as of December 31, 2018 11:59 PM. If you have any questions, ask your nurse or doctor.        amLODipine-benazepril 10-40 MG capsule Commonly known as: LOTREL TAKE ONE CAPSULE EACH MORNING   aspirin EC 81 MG tablet Take 1 tablet (81 mg total) by mouth daily.   canagliflozin 100 MG Tabs tablet Commonly known as: Invokana Take 1 tablet (100 mg total) by mouth daily before breakfast.   chlorthalidone 25 MG tablet Commonly known as: HYGROTON TAKE 1/2 TABLET DAILY   DULoxetine 60 MG capsule Commonly known as: Cymbalta Take 1 capsule (60 mg total) by mouth daily.   fenofibrate 145 MG tablet Commonly known as: TRICOR TAKE ONE (1) TABLET EACH DAY   fluticasone 50 MCG/ACT nasal spray Commonly known as: FLONASE Place 2 sprays into both nostrils daily.   metFORMIN 500 MG 24 hr tablet Commonly known as: GLUCOPHAGE-XR TAKE 2 TABLETS EVERY MORNING WITH BREAKFAST   nystatin powder  Commonly known as: MYCOSTATIN/NYSTOP APPLY TOPICALLY FOUR TIMES DAILY   omeprazole 20 MG capsule Commonly known as: PRILOSEC Take 1 capsule (20 mg total) by mouth daily.   ondansetron 4 MG disintegrating tablet Commonly known as: Zofran ODT Take 1 tablet (4 mg total) by mouth every 8 (eight) hours as needed for nausea or vomiting.   Ozempic (0.25 or 0.5 MG/DOSE) 2 MG/1.5ML Sopn Generic drug: Semaglutide(0.25 or 0.5MG /DOS) INJECT 0.5MG  SQ ONCE A WEEK   Premarin vaginal cream Generic drug: conjugated estrogens Apply 1 application topically daily as needed (irritation).   rosuvastatin 40 MG tablet Commonly known as: CRESTOR TAKE ONE (1) TABLET EACH DAY   traMADol 50 MG tablet Commonly known as: ULTRAM TAKE 1 TO 2 TABLETS EVERY 12 HOURS AS NEEDED FOR PAIN    Vitamin D (Ergocalciferol) 1.25 MG (50000 UT) Caps capsule Commonly known as: DRISDOL Take 1 capsule (50,000 Units total) by mouth every 7 (seven) days.          Objective:    BP (!) 139/58   Pulse 80   Temp 97.7 F (36.5 C) (Oral)   Ht 5\' 10"  (1.778 m)   BMI 28.58 kg/m   Allergies  Allergen Reactions  . Iodinated Diagnostic Agents   . Latex     Wt Readings from Last 3 Encounters:  09/14/18 199 lb 3.2 oz (90.4 kg)  09/01/18 198 lb 6 oz (90 kg)  07/31/18 200 lb (90.7 kg)    Physical Exam Constitutional:      Appearance: She is well-developed.  HENT:     Head: Normocephalic and atraumatic.  Eyes:     Conjunctiva/sclera: Conjunctivae normal.     Pupils: Pupils are equal, round, and reactive to light.  Cardiovascular:     Rate and Rhythm: Normal rate and regular rhythm.     Heart sounds: Normal heart sounds.  Pulmonary:     Effort: Pulmonary effort is normal.  Musculoskeletal:     Right foot: Decreased range of motion. Tenderness and swelling present. No deformity.       Feet:  Skin:    General: Skin is warm and dry.     Findings: No rash.  Neurological:     Mental Status: She is alert and oriented to person, place, and time.     Deep Tendon Reflexes: Reflexes are normal and symmetric.         Assessment & Plan:   1. Right foot pain - DG Foot Complete Right; Future - Ambulatory referral to Orthopedic Surgery  2. Closed nondisplaced fracture of phalanx of right great toe, unspecified phalanx, initial encounter - Ambulatory referral to Orthopedic Surgery   Continue all other maintenance medications as listed above.  Follow up plan: No follow-ups on file.  Educational handout given for [facility] PA-C [facility] [address]  [place], [place] [address] [phone]   01/04/2019, 10:22 AM

## 2019-01-05 ENCOUNTER — Telehealth: Payer: Self-pay | Admitting: Family

## 2019-01-05 ENCOUNTER — Ambulatory Visit: Payer: Medicare HMO | Admitting: Orthopedic Surgery

## 2019-01-05 DIAGNOSIS — M79671 Pain in right foot: Secondary | ICD-10-CM | POA: Diagnosis not present

## 2019-01-05 DIAGNOSIS — S92341A Displaced fracture of fourth metatarsal bone, right foot, initial encounter for closed fracture: Secondary | ICD-10-CM | POA: Diagnosis not present

## 2019-01-05 DIAGNOSIS — S92331A Displaced fracture of third metatarsal bone, right foot, initial encounter for closed fracture: Secondary | ICD-10-CM | POA: Diagnosis not present

## 2019-01-05 DIAGNOSIS — S92321A Displaced fracture of second metatarsal bone, right foot, initial encounter for closed fracture: Secondary | ICD-10-CM | POA: Diagnosis not present

## 2019-01-05 DIAGNOSIS — S92343A Displaced fracture of fourth metatarsal bone, unspecified foot, initial encounter for closed fracture: Secondary | ICD-10-CM | POA: Insufficient documentation

## 2019-01-05 DIAGNOSIS — S92333A Displaced fracture of third metatarsal bone, unspecified foot, initial encounter for closed fracture: Secondary | ICD-10-CM | POA: Insufficient documentation

## 2019-01-05 NOTE — Telephone Encounter (Signed)
FYI called to schedule patients CT central scheduling states she does not meet the requirements to get this done she has to be between the Ages of 58-77.

## 2019-01-11 ENCOUNTER — Ambulatory Visit (INDEPENDENT_AMBULATORY_CARE_PROVIDER_SITE_OTHER): Payer: Medicare HMO

## 2019-01-11 ENCOUNTER — Other Ambulatory Visit: Payer: Self-pay

## 2019-01-11 DIAGNOSIS — Z0000 Encounter for general adult medical examination without abnormal findings: Secondary | ICD-10-CM

## 2019-01-11 NOTE — Progress Notes (Addendum)
MEDICARE ANNUAL WELLNESS VISIT  01/11/2019  Telephone Visit Disclaimer This Medicare AWV was conducted by telephone due to national recommendations for restrictions regarding the COVID-19 Pandemic (e.g. social distancing).  I verified, using two identifiers, that I am speaking with Caitlin Mccarthy or their authorized healthcare agent. I discussed the limitations, risks, security, and privacy concerns of performing an evaluation and management service by telephone and the potential availability of an in-person appointment in the future. The patient expressed understanding and agreed to proceed.   Subjective:  Caitlin Mccarthy is a 83 y.o. female patient of Hawks, Theador Hawthorne, FNP who had a Medicare Annual Wellness Visit today via telephone. Caitlin Mccarthy is Retired and lives with their spouse. she has 2 children. she reports that she is socially active and does interact with friends/family regularly. she is minimally physically active.  Patient Care Team: [name], FNP as PCP - General (Family Medicine) Herminio Commons, MD as PCP - Cardiology (Cardiology)  Advanced Directives 01/11/2019 04/01/2017 01/09/2017 01/09/2017 04/01/2016 08/15/2015 04/27/2015  Does Patient Have a Medical Advance Directive? Yes No No No No No No  Type of Advance Directive Living will - - - - - -  Does patient want to make changes to medical advance directive? No - Patient declined - - - No - Patient declined - -  Copy of [place] in Chart? - - - - No - copy requested - -  Would patient like information on creating a medical advance directive? - No - Patient declined No - Patient declined - Yes - Scientist, clinical (histocompatibility and immunogenetics) given Yes - Scientist, clinical (histocompatibility and immunogenetics) given Yes - Scientist, clinical (histocompatibility and immunogenetics) given  Pre-existing out of facility DNR order (yellow form or pink MOST form) - - - - - - -    Hospital Utilization Over the Past 12 Months: # of hospitalizations or ER visits: 0 # of surgeries: 0  Review  of Systems    Patient reports that her overall health is unchanged compared to last year.  Patient Reported Readings (BP, Pulse, CBG, Weight, etc) none  Review of Systems: History obtained from chart review  All other systems negative.  Pain Assessment Pain : 0-10 Pain Score: 5  Pain Type: Chronic pain Pain Location: Knee Pain Orientation: Left, Right Pain Descriptors / Indicators: Throbbing, Sharp Pain Onset: More than a month ago Pain Frequency: Several days a week Effect of Pain on Daily Activities: Makes walking difficult at times. Must always use a walker     Current Medications & Allergies (verified) Allergies as of 01/11/2019      Reactions   Iodinated Diagnostic Agents    Latex       Medication List       Accurate as of January 11, 2019  2:32 PM. If you have any questions, ask your nurse or doctor.        amLODipine-benazepril 10-40 MG capsule Commonly known as: LOTREL TAKE ONE CAPSULE EACH MORNING   aspirin EC 81 MG tablet Take 1 tablet (81 mg total) by mouth daily.   canagliflozin 100 MG Tabs tablet Commonly known as: Invokana Take 1 tablet (100 mg total) by mouth daily before breakfast.   chlorthalidone 25 MG tablet Commonly known as: HYGROTON TAKE 1/2 TABLET DAILY   DULoxetine 60 MG capsule Commonly known as: Cymbalta Take 1 capsule (60 mg total) by mouth daily.   fenofibrate 145 MG tablet Commonly known as: TRICOR TAKE ONE (1) TABLET EACH DAY   fluticasone 50  MCG/ACT nasal spray Commonly known as: FLONASE Place 2 sprays into both nostrils daily.   metFORMIN 500 MG 24 hr tablet Commonly known as: GLUCOPHAGE-XR TAKE 2 TABLETS EVERY MORNING WITH BREAKFAST   nystatin powder Commonly known as: MYCOSTATIN/NYSTOP APPLY TOPICALLY FOUR TIMES DAILY   omeprazole 20 MG capsule Commonly known as: PRILOSEC Take 1 capsule (20 mg total) by mouth daily.   ondansetron 4 MG disintegrating tablet Commonly known as: Zofran ODT Take 1 tablet (4 mg  total) by mouth every 8 (eight) hours as needed for nausea or vomiting.   Ozempic (0.25 or 0.5 MG/DOSE) 2 MG/1.5ML Sopn Generic drug: Semaglutide(0.25 or 0.5MG /DOS) INJECT 0.5MG  SQ ONCE A WEEK   Premarin vaginal cream Generic drug: conjugated estrogens Apply 1 application topically daily as needed (irritation).   rosuvastatin 40 MG tablet Commonly known as: CRESTOR TAKE ONE (1) TABLET EACH DAY   traMADol 50 MG tablet Commonly known as: ULTRAM TAKE 1 TO 2 TABLETS EVERY 12 HOURS AS NEEDED FOR PAIN   Vitamin D (Ergocalciferol) 1.25 MG (50000 UT) Caps capsule Commonly known as: DRISDOL Take 1 capsule (50,000 Units total) by mouth every 7 (seven) days.       History (reviewed): Past Medical History:  Diagnosis Date  . Anxiety   . Depression   . GERD (gastroesophageal reflux disease)   . History of acute pyelonephritis    last episode 04-13-2015  w/ sepsis  . History of kidney stones   . History of recurrent UTIs    MULTIPLE  . Hyperlipidemia   . Hypertension   . OA (osteoarthritis)    knees, hips, hands  . Osteoporosis   . Poor memory    especially when has uti  . Renal calculus, left   . Stroke ([place])   . Type 2 diabetes mellitus ([place])   . Vitamin D deficiency 05/10/2016   Past Surgical History:  Procedure Laterality Date  . CATARACT EXTRACTION W/PHACO  06/06/2011   Procedure: CATARACT EXTRACTION PHACO AND INTRAOCULAR LENS PLACEMENT (IOC);  Surgeon: Tonny Branch;  Location: [facility];  Service: Ophthalmology;  Laterality: Right;  CDE=12.77  . CATARACT EXTRACTION W/PHACO  06/27/2011   Procedure: CATARACT EXTRACTION PHACO AND INTRAOCULAR LENS PLACEMENT (IOC);  Surgeon: Tonny Branch;  Location: [facility];  Service: Ophthalmology;  Laterality: Left;  CDE:13.96  . CYSTOSCOPY WITH URETEROSCOPY AND STENT PLACEMENT Right 04/27/2015   Procedure: CYSTOSCOPY WITH RIGHT URETEROSCOPY, BASKET REMOVAL OF STONE, REMOVAL OF RIGHT NEPHROSTOMY TUBE;  Surgeon: Irine Seal, MD;  Location: [facility];  Service: Urology;  Laterality: Right;  . CYSTOSCOPY/URETEROSCOPY/HOLMIUM LASER/STENT PLACEMENT Left 12/24/2016   Procedure: LEFT URETEROSCOPY WITH HOLMIUM LASER AND STENT PLACEMENT;  Surgeon: Irine Seal, MD;  Location: [facility];  Service: Urology;  Laterality: Left;  . EXTRACORPOREAL SHOCK WAVE LITHOTRIPSY  left 04-01-2016;  1980's  . FRACTURE SURGERY    . HIP ARTHROPLASTY    . HOLMIUM LASER APPLICATION Right 81/27/5170   Procedure: HOLMIUM LASER APPLICATION;  Surgeon: Irine Seal, MD;  Location: [facility];  Service: Urology;  Laterality: Right;  . KNEE ARTHROPLASTY    . KNEE ARTHROSCOPY Bilateral right 2005//  left ?  [name] LOOP RECORDER INSERTION N/A 04/01/2017   Procedure: LOOP RECORDER INSERTION;  Surgeon: Thompson Grayer, MD;  Location: [facility];  Service: Cardiovascular;  Laterality: N/A;  . TOTAL HIP ARTHROPLASTY  10/28/2011   Procedure: TOTAL HIP ARTHROPLASTY;  Surgeon: Gearlean Alf, MD;  Location: [facility];  Service: Orthopedics;  Laterality: Right;  . TOTAL KNEE ARTHROPLASTY Bilateral left 03-09-2007//  right 2006  . TRANSTHORACIC ECHOCARDIOGRAM  10/06/2006   normal echo,  ef 55-60%  . TUBAL LIGATION     Family History  Problem Relation Age of Onset  . Kidney disease Mother   . Diabetes Mother   . Congestive Heart Failure Father   . Heart failure Father   . Heart disease Brother   . Alcohol abuse Brother   . Anesthesia problems Neg Hx   . Hypotension Neg Hx   . Malignant hyperthermia Neg Hx   . Pseudochol deficiency Neg Hx    Social History   Socioeconomic History  . Marital status: Married    Spouse name: Not on file  . Number of children: 2  . Years of education: 29  . Highest education level: Not on file  Occupational History  . Occupation: Retired    Comment: retired  Scientific laboratory technician  . Financial resource strain: Not very hard  . Food insecurity    Worry: Never true    Inability: Never true  .  Transportation needs    Medical: No    Non-medical: No  Tobacco Use  . Smoking status: Current Every Day Smoker    Packs/day: 0.25    Years: 42.00    Pack years: 10.50    Types: Cigarettes  . Smokeless tobacco: Never Used  . Tobacco comment: 03/17/17 haven't qiut completely  Substance and Sexual Activity  . Alcohol use: No  . Drug use: No  . Sexual activity: Not Currently    Birth control/protection: Post-menopausal  Lifestyle  . Physical activity    Days per week: 3 days    Minutes per session: 30 min  . Stress: Not at all  Relationships  . Social connections    Talks on phone: More than three times a week    Gets together: More than three times a week    Attends religious service: Not on file    Active member of club or organization: Not on file    Attends meetings of clubs or organizations: Not on file    Relationship status: Married  Other Topics Concern  . Not on file  Social History Narrative   Lives with husband    Activities of Daily Living In your present state of health, do you have any difficulty performing the following activities: 01/11/2019 01/11/2019  Hearing? N Y  Comment - Pt does not wear hearing aids  Vision? Tempie Donning  Comment Wears reading glasses Reading glasses  Difficulty concentrating or making decisions? N N  Walking or climbing stairs? Y Y  Dressing or bathing? N N  Doing errands, shopping? Tempie Donning  Preparing Food and eating ? N N  Using the Toilet? N N  In the past six months, have you accidently leaked urine? N N  Do you have problems with loss of bowel control? N N  Managing your Medications? Y Y  Managing your Finances? Tempie Donning  Housekeeping or managing your Housekeeping? Y Y  Some recent data might be hidden    Patient Literacy How often do you need to have someone help you when you read instructions, pamphlets, or other written materials from your doctor or pharmacy?: 1 - Never What is the last grade level you completed in school?: 11th grade   Exercise Current Exercise Habits: The patient does not participate in regular exercise at present, Exercise limited by: orthopedic condition(s)  Diet Patient reports consuming 3 meals  a day and 2 snack(s) a day Patient reports that her primary diet is: Diabetic Patient reports that she does have regular access to food.   Depression Screen PHQ 2/9 Scores 12/31/2018 09/14/2018 09/01/2018 07/31/2018 06/11/2018 04/06/2018 02/06/2018  PHQ - 2 Score 0 0 0 0 0 0 0  PHQ- 9 Score - - - - 0 - -     Fall Risk Fall Risk  12/31/2018 09/14/2018 09/01/2018 07/31/2018 06/11/2018  Falls in the past year? 1 0 0 0 0  Number falls in past yr: 0 - - - -  Injury with Fall? 1 - - - -  Risk for fall due to : - - - - -  Risk for fall due to: Comment - - - - -     Objective:  Caitlin Mccarthy seemed alert and oriented and she participated appropriately during our telephone visit.  Blood Pressure Weight BMI  BP Readings from Last 3 Encounters:  12/31/18 (!) 139/58  09/14/18 120/63  09/01/18 129/63   Wt Readings from Last 3 Encounters:  09/14/18 199 lb 3.2 oz (90.4 kg)  09/01/18 198 lb 6 oz (90 kg)  07/31/18 200 lb (90.7 kg)   BMI Readings from Last 1 Encounters:  12/31/18 28.58 kg/m    *Unable to obtain current vital signs, weight, and BMI due to telephone visit type  Hearing/Vision  . [name] did not seem to have difficulty with hearing/understanding during the telephone conversation . Reports that she has had a formal eye exam by an eye care professional within the past year . Reports that she has not had a formal hearing evaluation within the past year *Unable to fully assess hearing and vision during telephone visit type  Cognitive Function: 6CIT Screen 01/11/2019  What Year? 0 points  What month? 0 points  What time? 0 points  Count back from 20 0 points  Months in reverse 0 points  Repeat phrase 0 points  Total Score 0   (Normal:0-7, Significant for Dysfunction: >8)  Normal Cognitive  Function Screening: Yes   Immunization & Health Maintenance Record Immunization History  Administered Date(s) Administered  . Influenza, High Dose Seasonal PF 04/21/2017, 04/06/2018  . Influenza,inj,Quad PF,6+ Mos 04/02/2013, 04/06/2014, 04/14/2015, 04/05/2016  . Influenza,inj,quad, With Preservative 03/31/2017  . Pneumococcal Conjugate-13 11/24/2014  . Pneumococcal Polysaccharide-23 06/08/2003, 10/12/2010  . Pneumococcal-Unspecified 03/31/2017  . Td 10/12/2010  . Tdap 10/12/2010    Health Maintenance  Topic Date Due  . FOOT EXAM  10/11/2018  . INFLUENZA VACCINE  01/30/2019  . HEMOGLOBIN A1C  03/17/2019  . OPHTHALMOLOGY EXAM  05/26/2019  . TETANUS/TDAP  10/11/2020  . DEXA SCAN  Completed  . PNA vac Low Risk Adult  Completed       Assessment  This is a routine wellness examination for Caitlin Mccarthy.  Health Maintenance: Due or Overdue Health Maintenance Due  Topic Date Due  . FOOT EXAM  10/11/2018    Caitlin Mccarthy does not need a referral for Community Assistance: Care Management:   no Social Work:    no Prescription Assistance:  no Nutrition/Diabetes Education:  no   Plan:  Personalized Goals Goals Addressed            This Visit's Progress   . DIET - EAT MORE FRUITS AND VEGETABLES      . Exercise 150 min/wk Moderate Activity       Walk with walker as much as possible      Personalized Health Maintenance &  Screening Recommendations  Smoking cessation counseling  Lung Cancer Screening Recommended: yes  Ordered in previous encounter (Low Dose CT Chest recommended if Age 22-80 years, 30 pack-year currently smoking OR have quit w/in past 15 years) Hepatitis C Screening recommended: no  Patients DOB not with guidelines HIV Screening recommended: no  Advanced Directives: Written information was not prepared per patient's request.  Referrals & Orders No orders of the defined types were placed in this encounter.   Follow-up Plan . Follow-up with  [name], FNP as planned   I have personally reviewed and noted the following in the patient's chart:   . Medical and social history . Use of alcohol, tobacco or illicit drugs  . Current medications and supplements . Functional ability and status . Nutritional status . Physical activity . Advanced directives . List of other physicians . Hospitalizations, surgeries, and ER visits in previous 12 months . Vitals . Screenings to include cognitive, depression, and falls . Referrals and appointments  In addition, I have reviewed and discussed with Caitlin Mccarthy certain preventive protocols, quality metrics, and best practice recommendations. A written personalized care plan for preventive services as well as general preventive health recommendations is available and can be mailed to the patient at her request.      Rolena Infante LPN 3/00/5110   I have reviewed and agree with the above AWV documentation.   Evelina Dun, FNP

## 2019-01-15 ENCOUNTER — Other Ambulatory Visit: Payer: Self-pay | Admitting: Family

## 2019-01-15 DIAGNOSIS — Z0289 Encounter for other administrative examinations: Secondary | ICD-10-CM

## 2019-01-15 DIAGNOSIS — M1612 Unilateral primary osteoarthritis, left hip: Secondary | ICD-10-CM

## 2019-01-15 NOTE — Telephone Encounter (Signed)
Patient has been seen a couple times since regular follow up. Do you still want her to come in for a med refill?

## 2019-01-17 LAB — CUP PACEART REMOTE DEVICE CHECK
Date Time Interrogation Session: 20200718093534
Implantable Pulse Generator Implant Date: 20181002

## 2019-01-18 ENCOUNTER — Ambulatory Visit (INDEPENDENT_AMBULATORY_CARE_PROVIDER_SITE_OTHER): Payer: Medicare HMO | Admitting: *Deleted

## 2019-01-18 DIAGNOSIS — I639 Cerebral infarction, unspecified: Secondary | ICD-10-CM

## 2019-01-20 ENCOUNTER — Telehealth: Payer: Self-pay | Admitting: Family

## 2019-01-20 NOTE — Chronic Care Management (AMB) (Signed)
°  Chronic Care Management   Outreach Note  01/20/2019 Name: Caitlin Mccarthy MRN: [id number] DOB: Aug 15, 1935  Referred by: [name], FNP Reason for referral : Chronic Care Management (Initial CCM outreach )   An unsuccessful telephone outreach was attempted today. The patient was referred to the case management team by for assistance with chronic care management and care coordination.   Follow Up Plan: A HIPPA compliant phone message was left for the patient providing contact information and requesting a return call.  The care management team will reach out to the patient again over the next 7 days.   [place]  [email or web address]   ??[phone]

## 2019-01-21 DIAGNOSIS — S92341A Displaced fracture of fourth metatarsal bone, right foot, initial encounter for closed fracture: Secondary | ICD-10-CM | POA: Diagnosis not present

## 2019-01-21 DIAGNOSIS — M79671 Pain in right foot: Secondary | ICD-10-CM | POA: Diagnosis not present

## 2019-01-21 DIAGNOSIS — S92331A Displaced fracture of third metatarsal bone, right foot, initial encounter for closed fracture: Secondary | ICD-10-CM | POA: Diagnosis not present

## 2019-01-27 NOTE — Chronic Care Management (AMB) (Signed)
°  Chronic Care Management   Outreach Note  01/27/2019 Name: Caitlin Mccarthy MRN: [id number] DOB: September 01, 1935  Referred by: [name], FNP Reason for referral : Chronic Care Management (Initial CCM outreach ) and Chronic Care Management (Second CCM outreach was unsuccessful. )   A second unsuccessful telephone outreach was attempted today. The patient was referred to the case management team for assistance with chronic care management and care coordination.   Follow Up Plan: The care management team will reach out to the patient again over the next 7 days.   [place]  [email or web address]   ??[phone]

## 2019-02-02 NOTE — Progress Notes (Signed)
Carelink Summary Report / Loop Recorder 

## 2019-02-10 ENCOUNTER — Telehealth: Payer: Self-pay | Admitting: Family

## 2019-02-10 NOTE — Chronic Care Management (AMB) (Signed)
Chronic Care Management   Note  02/10/2019 Name: SANGEETA YOUSE MRN: [id number] DOB: [birth year]  ZILLAH ALEXIE is a 83 y.o. year old female who is a primary care patient of [name], FNP. I reached out to [name] by phone today in response to a referral sent by Ms. Mosetta Anis Karren's health plan.    Ms. Bauer was given information about Chronic Care Management services today including:  1. CCM service includes personalized support from designated clinical staff supervised by her physician, including individualized plan of care and coordination with other care providers 2. 24/7 contact phone numbers for assistance for urgent and routine care needs. 3. Service will only be billed when office clinical staff spend 20 minutes or more in a month to coordinate care. 4. Only one practitioner may furnish and bill the service in a calendar month. 5. The patient may stop CCM services at any time (effective at the end of the month) by phone call to the office staff. 6. The patient will be responsible for cost sharing (co-pay) of up to 20% of the service fee (after annual deductible is met).  Patients daughter Rebeca Alert agreed to services and verbal consent obtained.   Follow up plan: Telephone appointment with CCM team member scheduled for: 03/01/2019  [place]  [email or web address]   ??[phone]

## 2019-02-12 ENCOUNTER — Other Ambulatory Visit: Payer: Self-pay | Admitting: Family

## 2019-02-12 DIAGNOSIS — M1612 Unilateral primary osteoarthritis, left hip: Secondary | ICD-10-CM

## 2019-02-12 DIAGNOSIS — Z0289 Encounter for other administrative examinations: Secondary | ICD-10-CM

## 2019-02-15 NOTE — Chronic Care Management (AMB) (Signed)
Encounter made in error. 

## 2019-02-17 ENCOUNTER — Ambulatory Visit (INDEPENDENT_AMBULATORY_CARE_PROVIDER_SITE_OTHER): Payer: Medicare HMO | Admitting: Physician Assistant

## 2019-02-17 DIAGNOSIS — Z0289 Encounter for other administrative examinations: Secondary | ICD-10-CM | POA: Diagnosis not present

## 2019-02-17 DIAGNOSIS — N3000 Acute cystitis without hematuria: Secondary | ICD-10-CM

## 2019-02-17 DIAGNOSIS — R110 Nausea: Secondary | ICD-10-CM

## 2019-02-17 DIAGNOSIS — M1612 Unilateral primary osteoarthritis, left hip: Secondary | ICD-10-CM | POA: Diagnosis not present

## 2019-02-17 MED ORDER — SULFAMETHOXAZOLE-TRIMETHOPRIM 800-160 MG PO TABS: 1.0000 | ORAL_TABLET | Freq: Two times a day (BID) | ORAL | 0 refills | Status: DC

## 2019-02-17 MED ORDER — ONDANSETRON 4 MG PO TBDP: 4.0000 mg | ORAL_TABLET | Freq: Three times a day (TID) | ORAL | 0 refills | Status: DC | PRN

## 2019-02-17 MED ORDER — TRAMADOL HCL 50 MG PO TABS: 50.0000 mg | ORAL_TABLET | Freq: Four times a day (QID) | ORAL | 0 refills | Status: DC

## 2019-02-18 ENCOUNTER — Other Ambulatory Visit: Payer: Self-pay | Admitting: Family

## 2019-02-18 ENCOUNTER — Other Ambulatory Visit: Payer: Medicare HMO

## 2019-02-18 ENCOUNTER — Other Ambulatory Visit: Payer: Self-pay

## 2019-02-18 ENCOUNTER — Ambulatory Visit (INDEPENDENT_AMBULATORY_CARE_PROVIDER_SITE_OTHER): Payer: Medicare HMO | Admitting: *Deleted

## 2019-02-18 ENCOUNTER — Encounter: Payer: Self-pay | Admitting: Physician Assistant

## 2019-02-18 DIAGNOSIS — S92341A Displaced fracture of fourth metatarsal bone, right foot, initial encounter for closed fracture: Secondary | ICD-10-CM | POA: Diagnosis not present

## 2019-02-18 DIAGNOSIS — S92331A Displaced fracture of third metatarsal bone, right foot, initial encounter for closed fracture: Secondary | ICD-10-CM | POA: Diagnosis not present

## 2019-02-18 DIAGNOSIS — N3000 Acute cystitis without hematuria: Secondary | ICD-10-CM | POA: Diagnosis not present

## 2019-02-18 DIAGNOSIS — Z0189 Encounter for other specified special examinations: Secondary | ICD-10-CM | POA: Diagnosis not present

## 2019-02-18 DIAGNOSIS — I63439 Cerebral infarction due to embolism of unspecified posterior cerebral artery: Secondary | ICD-10-CM

## 2019-02-18 LAB — CUP PACEART REMOTE DEVICE CHECK
Date Time Interrogation Session: 20200820100752
Implantable Pulse Generator Implant Date: 20181002

## 2019-02-18 LAB — MICROSCOPIC EXAMINATION: Renal Epithel, UA: NONE SEEN /hpf

## 2019-02-18 LAB — URINALYSIS, COMPLETE
Bilirubin, UA: NEGATIVE
Glucose, UA: NEGATIVE
Ketones, UA: NEGATIVE
Nitrite, UA: POSITIVE — AB
Protein,UA: NEGATIVE
Specific Gravity, UA: 1.025 (ref 1.005–1.030)
Urobilinogen, Ur: 0.2 mg/dL (ref 0.2–1.0)
pH, UA: 6.5 (ref 5.0–7.5)

## 2019-02-18 MED ORDER — CEPHALEXIN 500 MG PO CAPS: 500.0000 mg | ORAL_CAPSULE | Freq: Two times a day (BID) | ORAL | 0 refills | Status: DC

## 2019-02-18 NOTE — Addendum Note (Signed)
Addended by: Earlene Plater on: 02/18/2019 01:18 PM   Modules accepted: Orders

## 2019-02-18 NOTE — Progress Notes (Signed)
Telephone visit  Subjective: CB:ULAG, nausea uti PCP: [name], FNP TXM:IWOEHOZY Caitlin Mccarthy is a 83 y.o. female calls for telephone consult today. Patient provides verbal consent for consult held via phone.  Patient is identified with 2 separate identifiers.  At this time the entire area is on COVID-19 social distancing and stay home orders are in place.  Patient is of higher risk and therefore we are performing this by a virtual method.  Location of patient: home Location of provider: [facility] Others present for call: no  This patient has had several days of dysuria, frequency and nocturia. There is also pain over the bladder in the suprapubic region, no back pain. Denies leakage or hematuria.  Denies fever or chills. No pain in flank area.  She has also been experiencing nausea for about 2 days.  The patient's been dealing with long-term joint pain.  I can see that Alyse Low has given her tramadol in the past.  She is refill.  I will send that in and then forward this note to her PCP.   ROS: Per HPI  Allergies  Allergen Reactions  . Iodinated Diagnostic Agents   . Latex    Past Medical History:  Diagnosis Date  . Anxiety   . Depression   . GERD (gastroesophageal reflux disease)   . History of acute pyelonephritis    last episode 04-13-2015  w/ sepsis  . History of kidney stones   . History of recurrent UTIs    MULTIPLE  . Hyperlipidemia   . Hypertension   . OA (osteoarthritis)    knees, hips, hands  . Osteoporosis   . Poor memory    especially when has uti  . Renal calculus, left   . Stroke ([place])   . Type 2 diabetes mellitus ([place])   . Vitamin D deficiency 05/10/2016    Current Outpatient Medications:  .  amLODipine-benazepril (LOTREL) 10-40 MG capsule, TAKE ONE CAPSULE EACH MORNING, Disp: 90 capsule, Rfl: 1 .  aspirin EC 81 MG tablet, Take 1 tablet (81 mg total) by mouth daily., Disp: , Rfl:  .  canagliflozin (INVOKANA) 100 MG TABS tablet, Take 1 tablet  (100 mg total) by mouth daily before breakfast., Disp: 30 tablet, Rfl: 5 .  cephALEXin (KEFLEX) 500 MG capsule, Take 1 capsule (500 mg total) by mouth 2 (two) times daily., Disp: 14 capsule, Rfl: 0 .  chlorthalidone (HYGROTON) 25 MG tablet, TAKE 1/2 TABLET DAILY, Disp: 45 tablet, Rfl: 3 .  DULoxetine (CYMBALTA) 60 MG capsule, Take 1 capsule (60 mg total) by mouth daily., Disp: 90 capsule, Rfl: 3 .  fenofibrate (TRICOR) 145 MG tablet, TAKE ONE (1) TABLET EACH DAY, Disp: 90 tablet, Rfl: 2 .  fluticasone (FLONASE) 50 MCG/ACT nasal spray, Place 2 sprays into both nostrils daily., Disp: 16 g, Rfl: 6 .  metFORMIN (GLUCOPHAGE-XR) 500 MG 24 hr tablet, TAKE 2 TABLETS EVERY MORNING WITH BREAKFAST, Disp: 180 tablet, Rfl: 0 .  nystatin (MYCOSTATIN/NYSTOP) powder, APPLY TOPICALLY FOUR TIMES DAILY, Disp: 60 g, Rfl: 1 .  omeprazole (PRILOSEC) 20 MG capsule, Take 1 capsule (20 mg total) by mouth daily., Disp: 30 capsule, Rfl: 3 .  ondansetron (ZOFRAN ODT) 4 MG disintegrating tablet, Take 1 tablet (4 mg total) by mouth every 8 (eight) hours as needed for nausea or vomiting., Disp: 20 tablet, Rfl: 0 .  OZEMPIC, 0.25 OR 0.5 MG/DOSE, 2 MG/1.5ML SOPN, INJECT 0.5MG  SQ ONCE A WEEK, Disp: 1.5 mL, Rfl: 2 .  PREMARIN vaginal  cream, Apply 1 application topically daily as needed (irritation). , Disp: , Rfl:  .  rosuvastatin (CRESTOR) 40 MG tablet, TAKE ONE (1) TABLET EACH DAY, Disp: 30 tablet, Rfl: 6 .  sulfamethoxazole-trimethoprim (BACTRIM DS) 800-160 MG tablet, Take 1 tablet by mouth 2 (two) times daily., Disp: 14 tablet, Rfl: 0 .  traMADol (ULTRAM) 50 MG tablet, Take 1 tablet (50 mg total) by mouth 4 (four) times daily., Disp: 60 tablet, Rfl: 0 .  Vitamin D, Ergocalciferol, (DRISDOL) 1.25 MG (50000 UT) CAPS capsule, Take 1 capsule (50,000 Units total) by mouth every 7 (seven) days., Disp: 12 capsule, Rfl: 3  Assessment/ Plan: 83 y.o. female   1. Primary osteoarthritis of left hip - traMADol (ULTRAM) 50 MG tablet; Take  1 tablet (50 mg total) by mouth 4 (four) times daily.  Dispense: 60 tablet; Refill: 0  2. Pain management contract signed - traMADol (ULTRAM) 50 MG tablet; Take 1 tablet (50 mg total) by mouth 4 (four) times daily.  Dispense: 60 tablet; Refill: 0  3. Nausea - ondansetron (ZOFRAN ODT) 4 MG disintegrating tablet; Take 1 tablet (4 mg total) by mouth every 8 (eight) hours as needed for nausea or vomiting.  Dispense: 20 tablet; Refill: 0  4. Acute cystitis without hematuria - sulfamethoxazole-trimethoprim (BACTRIM DS) 800-160 MG tablet; Take 1 tablet by mouth 2 (two) times daily.  Dispense: 14 tablet; Refill: 0   No follow-ups on file.  Continue all other maintenance medications as listed above.  Start time: 1:30 PM End time: 1:42 PM  Meds ordered this encounter  Medications  . sulfamethoxazole-trimethoprim (BACTRIM DS) 800-160 MG tablet    Sig: Take 1 tablet by mouth 2 (two) times daily.    Dispense:  14 tablet    Refill:  0    Order Specific Question:   Supervising Provider    Answer:   Janora Norlander [[id number]]  . ondansetron (ZOFRAN ODT) 4 MG disintegrating tablet    Sig: Take 1 tablet (4 mg total) by mouth every 8 (eight) hours as needed for nausea or vomiting.    Dispense:  20 tablet    Refill:  0    Order Specific Question:   Supervising Provider    Answer:   Janora Norlander [[id number]]  . traMADol (ULTRAM) 50 MG tablet    Sig: Take 1 tablet (50 mg total) by mouth 4 (four) times daily.    Dispense:  60 tablet    Refill:  0    Order Specific Question:   Supervising Provider    Answer:   Janora Norlander [[id number]]    Particia Nearing PA-C [place] [phone]

## 2019-02-23 ENCOUNTER — Other Ambulatory Visit: Payer: Self-pay | Admitting: Family

## 2019-02-23 LAB — URINE CULTURE

## 2019-02-23 MED ORDER — AMOXICILLIN-POT CLAVULANATE 875-125 MG PO TABS: 1.0000 | ORAL_TABLET | Freq: Two times a day (BID) | ORAL | 0 refills | Status: AC

## 2019-02-26 NOTE — Progress Notes (Signed)
Carelink Summary Report / Loop Recorder 

## 2019-03-01 ENCOUNTER — Ambulatory Visit (INDEPENDENT_AMBULATORY_CARE_PROVIDER_SITE_OTHER): Payer: Medicare HMO | Admitting: *Deleted

## 2019-03-01 DIAGNOSIS — F339 Major depressive disorder, recurrent, unspecified: Secondary | ICD-10-CM

## 2019-03-01 DIAGNOSIS — E1165 Type 2 diabetes mellitus with hyperglycemia: Secondary | ICD-10-CM | POA: Diagnosis not present

## 2019-03-01 DIAGNOSIS — F411 Generalized anxiety disorder: Secondary | ICD-10-CM

## 2019-03-01 DIAGNOSIS — I6930 Unspecified sequelae of cerebral infarction: Secondary | ICD-10-CM

## 2019-03-01 NOTE — Patient Instructions (Signed)
Visit Information  Goals Addressed            This Visit's Progress     Patient Stated   . "I don't want to fall" (pt-stated)       Current Barriers:  . Physical deconditioning  . Balance problems  . Broken toes  Nurse Case Manager Clinical Goal(s):  [name] Over the next 30 days, the patient will demonstrate ongoing self health care management ability as evidenced by not having any falls*  Interventions:  . Evaluation of current treatment plan related to fall prevention and patient's adherence to plan as established by provider. . Fall Risk Assessment . Advised patient to continue using fall prevention safety measures around her home . Advised to move carefully and change positions slowly to minimize risk for falls . Encouraged patient to continue using rolling walker with seat . Discussed plans with patient for ongoing care management follow up and provided patient with direct contact information for care management team  Patient Self Care Activities:  . Currently UNABLE TO independently run errands . Calls provider office for new concerns or questions  Initial goal documentation     . "I want to make sure the bladder infection clears up" (pt-stated)       Current Barriers:  . Chronic Disease Management support and education needs related to recurrent UTIs  Nurse Case Manager Clinical Goal(s):  [name] Over the next 7 days, patient will complete Augmentin as prescribed for UTI . Over the next 7 days, after completion of Augmentin, patient will return a sterile urine specimen for UA to verify resolution  Interventions:  . Evaluation of current treatment plan related to UTI treatment and patient's adherence to plan as established by provider. . Advised patient to pick up a sterile urine cup that will be left at the front window . Reviewed medications with patient and discussed Augmentin as current treatment and Keflex and Septra as previous treatments  Patient Self Care Activities:   . Currently UNABLE TO independently run errands . Calls provider office for new concerns or questions  Initial goal documentation     . "I would like to build up the strength in my legs" (pt-stated)       Current Barriers:  . Chronic Disease Management support and education needs related to latent effects of CVA on balance and physical activity ability  Nurse Case Manager Clinical Goal(s):  [name] Over the next 30 days, patient will work with [facility] to address needs related to strength training  and reconditioning   Interventions:  . Evaluation of current treatment plan related to physical fittness and patient's adherence to plan as established by provider. . Discussed plans with patient for ongoing care management follow up and provided patient with direct contact information for care management team . Provided patient with printed educational materials related to lower extremity strengthening exercises  Patient Self Care Activities:  . Currently UNABLE TO independently Run Errands . Calls provider office for new concerns or questions  Initial goal documentation     . "I would like to manage my stress better" (pt-stated)       Current Barriers:  . Chronic Disease Management support and education needs related to stress management and depression/anxiety.   Nurse Case Manager Clinical Goal(s):  [name] Over the next 30 days, patient will work with CM clinical social worker to discuss stress management and it's relationship with depression/anxiety.   Interventions:  . Evaluation of current treatment plan related to depression and  patient's adherence to plan as established by provider. . Reviewed medications with patient and discussed Cymbalta 22m . Collaborated with STheadore Nan LCSW regarding stress management and depression . Recommended talking with our LCSW . Discussed HPI with patient. Onset of stress and depression was prior Covid-19 quarantine recommendations but has gotten worse  since those restrictions were recommended o States that she gets upset because she can't do things that she used to be able to do . Discussed current management strategies. Patient stated that she will walk from room to room in her house when feeling stressed/anxious. This helps some.  . Chart reviewed: Patient is not prescribe any anti-anxiety medications  Patient Self Care Activities:  . Currently UNABLE TO independently run errands . Calls provider office for new concerns or questions  Initial goal documentation     . "I would like to sleep better at night" (pt-stated)       Current Barriers:  . nocturia  Nurse Case Manager Clinical Goal(s):  .Marland KitchenOver the next 30 days, patient will work with PCP to address needs related to disturbed sleep due to nocturia  Interventions:  . Discussed cause of sleep disturbance with patient o States that she gets up to urinate 4 or 5 times a night o She is unable to get back to sleep sometimes after getting up . Will collaborate with CEvelina Dun FNP regarding frequent nocturia and its effect on her sleep . RNCM will discuss with PCP if a bladder control medication is appropriate  Patient Self Care Activities:  . Currently UNABLE TO independently run errands . Performs ADL's independently  Initial goal documentation        The patient verbalized understanding of instructions provided today and declined a print copy of patient instruction materials.    [name] given information about Chronic Care Management services today including:  1. CCM service includes personalized support from designated clinical staff supervised by her physician, including individualized plan of care and coordination with other care providers 2. 24/7 contact phone numbers for assistance for urgent and routine care needs. 3. Service will only be billed when office clinical staff spend 20 minutes or more in a month to coordinate care. 4. Only one practitioner  may furnish and bill the service in a calendar month. 5. The patient may stop CCM services at any time (effective at the end of the month) by phone call to the office staff. 6. The patient will be responsible for cost sharing (co-pay) of up to 20% of the service fee (after annual deductible is met).  Patient agreed to services and verbal consent obtained.     Plan:  The care management team will reach out to the patient again over the next 30 days.   RNCM will mail educational materials for seated lower extremity exercises RNCM will collaborate with PCP to order another U/A and place urine cup at front desk   KChong SicilianBSN, RN-BC [place] [phone]

## 2019-03-01 NOTE — Chronic Care Management (AMB) (Addendum)
Chronic Care Management   Initial Visit Note  03/01/2019 Name: Caitlin Mccarthy MRN: [id number] DOB: [birth year]  Referred by: Caitlin Balloon, FNP Reason for referral : No chief complaint on file.   Caitlin Mccarthy is a 83 y.o. year old female who is a primary care patient of Caitlin Balloon, FNP. The CCM team was consulted for assistance with chronic disease management and care coordination needs.   Review of patient status, including review of consultants reports, relevant laboratory and other test results, and collaboration with appropriate care team members and the patient's provider was performed as part of comprehensive patient evaluation and provision of chronic care management services.    SDOH (Social Determinants of Health) screening performed today: Depression   Tobacco Use Stress Physical Activity. See Care Plan for related entries.   Subjective: "I think I'm doing pretty good I just need to build my strength back up and I think I'm afraid of falling again even though I didn't break anything."  I spoke with Caitlin Mccarthy by telephone today. Her daughter was present in the room with her during our visit.    Objective: Medications: Outpatient Encounter Medications as of 03/01/2019  Medication Sig  . amLODipine-benazepril (LOTREL) 10-40 MG capsule TAKE ONE CAPSULE EACH MORNING  . amoxicillin-clavulanate (AUGMENTIN) 875-125 MG tablet Take 1 tablet by mouth 2 (two) times daily for 10 days.  Caitlin Mccarthy aspirin EC 81 MG tablet Take 1 tablet (81 mg total) by mouth daily.  . canagliflozin (INVOKANA) 100 MG TABS tablet Take 1 tablet (100 mg total) by mouth daily before breakfast.  . chlorthalidone (HYGROTON) 25 MG tablet TAKE 1/2 TABLET DAILY  . DULoxetine (CYMBALTA) 60 MG capsule Take 1 capsule (60 mg total) by mouth daily.  . fenofibrate (TRICOR) 145 MG tablet TAKE ONE (1) TABLET EACH DAY  . fluticasone (FLONASE) 50 MCG/ACT nasal spray Place 2 sprays into both nostrils daily.  . metFORMIN  (GLUCOPHAGE-XR) 500 MG 24 hr tablet TAKE 2 TABLETS EVERY MORNING WITH BREAKFAST  . nystatin (MYCOSTATIN/NYSTOP) powder APPLY TOPICALLY FOUR TIMES DAILY  . omeprazole (PRILOSEC) 20 MG capsule Take 1 capsule (20 mg total) by mouth daily.  . ondansetron (ZOFRAN ODT) 4 MG disintegrating tablet Take 1 tablet (4 mg total) by mouth every 8 (eight) hours as needed for nausea or vomiting.  Caitlin Mccarthy OZEMPIC, 0.25 OR 0.5 MG/DOSE, 2 MG/1.5ML SOPN INJECT 0.5MG  SQ ONCE A WEEK  . PREMARIN vaginal cream Apply 1 application topically daily as needed (irritation).   . rosuvastatin (CRESTOR) 40 MG tablet TAKE ONE (1) TABLET EACH DAY  . traMADol (ULTRAM) 50 MG tablet Take 1 tablet (50 mg total) by mouth 4 (four) times daily.  . Vitamin D, Ergocalciferol, (DRISDOL) 1.25 MG (50000 UT) CAPS capsule Take 1 capsule (50,000 Units total) by mouth every 7 (seven) days.  . [DISCONTINUED] cephALEXin (KEFLEX) 500 MG capsule Take 1 capsule (500 mg total) by mouth 2 (two) times daily.  . [DISCONTINUED] sulfamethoxazole-trimethoprim (BACTRIM DS) 800-160 MG tablet Take 1 tablet by mouth 2 (two) times daily.   No facility-administered encounter medications on file as of 03/01/2019.    Lab Results  Component Value Date   HGBA1C 6.3 09/14/2018   HGBA1C 6.5 06/11/2018   HGBA1C 7.8 (H) 04/06/2018   Lab Results  Component Value Date   LDLCALC 61 09/14/2018   CREATININE 0.93 12/18/2018   Fall Risk  03/01/2019 12/31/2018 09/14/2018 09/01/2018 07/31/2018  Falls in the past year? 0 1 0 0 0  Number falls  in past yr: 0 0 - - -  Injury with Fall? 0 1 - - -  Risk for fall due to : History of fall(s);Impaired balance/gait - - - -  Risk for fall due to: Comment - - - - -  Follow up Falls prevention discussed - - - -   Depression screen [facility] 2/9 03/01/2019 03/01/2019 12/31/2018 09/14/2018 09/01/2018  Decreased Interest 1 0 0 0 0  Down, Depressed, Hopeless 1 1 0 0 0  PHQ - 2 Score 2 1 0 0 0  Altered sleeping 3 - - - -  Tired, decreased energy - - - - -   Change in appetite - - - - -  Feeling bad or failure about yourself  - - - - -  Trouble concentrating - - - - -  Moving slowly or fidgety/restless - - - - -  Suicidal thoughts - - - - -  PHQ-9 Score 5 - - - -  Difficult doing work/chores - - - - -  Some recent data might be hidden    Assessment: Goals Addressed            This Visit's Progress     Patient Stated   . "I don't want to fall" (pt-stated)       Current Barriers:  . Physical deconditioning  . Balance problems  . Broken toes  Nurse Case Manager Clinical Goal(s):  Caitlin Mccarthy Over the next 30 days, the patient will demonstrate ongoing self health care management ability as evidenced by not having any falls*  Interventions:  . Evaluation of current treatment plan related to fall prevention and patient's adherence to plan as established by provider. . Fall Risk Assessment . Advised patient to continue using fall prevention safety measures around her home . Advised to move carefully and change positions slowly to minimize risk for falls . Encouraged patient to continue using rolling walker with seat . Discussed plans with patient for ongoing care management follow up and provided patient with direct contact information for care management team  Patient Self Care Activities:  . Currently UNABLE TO independently run errands . Calls provider office for new concerns or questions  Initial goal documentation     . "I want to make sure the bladder infection clears up" (pt-stated)       Current Barriers:  . Chronic Disease Management support and education needs related to recurrent UTIs  Nurse Case Manager Clinical Goal(s):  Caitlin Mccarthy Over the next 7 days, patient will complete Augmentin as prescribed for UTI . Over the next 7 days, after completion of Augmentin, patient will return a sterile urine specimen for UA to verify resolution  Interventions:  . Evaluation of current treatment plan related to UTI treatment and patient's  adherence to plan as established by provider. . Advised patient to pick up a sterile urine cup that will be left at the front window . Reviewed medications with patient and discussed Augmentin as current treatment and Keflex and Septra as previous treatments  Patient Self Care Activities:  . Currently UNABLE TO independently run errands . Calls provider office for new concerns or questions  Initial goal documentation     . "I would like to build up the strength in my legs" (pt-stated)       Current Barriers:  . Chronic Disease Management support and education needs related to latent effects of CVA on balance and physical activity ability  Nurse Case Manager Clinical Goal(s):  Caitlin Mccarthy Over the next  30 days, patient will work with [facility] to address needs related to strength training  and reconditioning   Interventions:  . Evaluation of current treatment plan related to physical fittness and patient's adherence to plan as established by provider. . Discussed plans with patient for ongoing care management follow up and provided patient with direct contact information for care management team . Provided patient with printed educational materials related to lower extremity strengthening exercises  Patient Self Care Activities:  . Currently UNABLE TO independently Run Errands . Calls provider office for new concerns or questions  Initial goal documentation     . "I would like to manage my stress better" (pt-stated)       Current Barriers:  . Chronic Disease Management support and education needs related to stress management and depression/anxiety.   Nurse Case Manager Clinical Goal(s):  Caitlin Mccarthy Over the next 30 days, patient will work with CM clinical social worker to discuss stress management and it's relationship with depression/anxiety.   Interventions:  . Evaluation of current treatment plan related to depression and patient's adherence to plan as established by provider. . Reviewed medications  with patient and discussed Cymbalta 60mg  . Collaborated with Theadore Nan, LCSW regarding stress management and depression . Recommended talking with our LCSW . Discussed HPI with patient. Onset of stress and depression was prior Covid-19 quarantine recommendations but has gotten worse since those restrictions were recommended o States that she gets upset because she can't do things that she used to be able to do . Discussed current management strategies. Patient stated that she will walk from room to room in her house when feeling stressed/anxious. This helps some.  . Chart reviewed: Patient is not prescribe any anti-anxiety medications  Patient Self Care Activities:  . Currently UNABLE TO independently run errands . Calls provider office for new concerns or questions  Initial goal documentation     . "I would like to sleep better at night" (pt-stated)       Current Barriers:  . nocturia  Nurse Case Manager Clinical Goal(s):  Caitlin Mccarthy Over the next 30 days, patient will work with PCP to address needs related to disturbed sleep due to nocturia  Interventions:  . Discussed cause of sleep disturbance with patient o States that she gets up to urinate 4 or 5 times a night o She is unable to get back to sleep sometimes after getting up . Will collaborate with Evelina Dun, FNP regarding frequent nocturia and its effect on her sleep . RNCM will discuss with PCP if a bladder control medication is appropriate  Patient Self Care Activities:  . Currently UNABLE TO independently run errands . Performs ADL's independently  Initial goal documentation         Plan:  The care management team will reach out to the patient again over the next 30 days.   RNCM will mail educational materials for seated lower extremity exercises RNCM will collaborate with PCP to order another U/A and place urine cup at front desk   Chong Sicilian BSN, RN-BC [place] / [place]: [phone]   I have reviewed and agree with the above  documentation.   Evelina Dun, FNP

## 2019-03-09 ENCOUNTER — Other Ambulatory Visit: Payer: Self-pay

## 2019-03-09 ENCOUNTER — Other Ambulatory Visit: Payer: Medicare HMO

## 2019-03-09 ENCOUNTER — Other Ambulatory Visit: Payer: Self-pay | Admitting: Family

## 2019-03-09 DIAGNOSIS — R399 Unspecified symptoms and signs involving the genitourinary system: Secondary | ICD-10-CM | POA: Diagnosis not present

## 2019-03-09 LAB — URINALYSIS, COMPLETE
Bilirubin, UA: NEGATIVE
Glucose, UA: NEGATIVE
Ketones, UA: NEGATIVE
Nitrite, UA: NEGATIVE
Protein,UA: NEGATIVE
RBC, UA: NEGATIVE
Specific Gravity, UA: 1.015 (ref 1.005–1.030)
Urobilinogen, Ur: 0.2 mg/dL (ref 0.2–1.0)
pH, UA: 6 (ref 5.0–7.5)

## 2019-03-09 LAB — MICROSCOPIC EXAMINATION

## 2019-03-10 LAB — URINE CULTURE

## 2019-03-22 ENCOUNTER — Other Ambulatory Visit: Payer: Self-pay | Admitting: Physician Assistant

## 2019-03-22 DIAGNOSIS — Z0289 Encounter for other administrative examinations: Secondary | ICD-10-CM

## 2019-03-22 DIAGNOSIS — M1612 Unilateral primary osteoarthritis, left hip: Secondary | ICD-10-CM

## 2019-03-23 ENCOUNTER — Ambulatory Visit (INDEPENDENT_AMBULATORY_CARE_PROVIDER_SITE_OTHER): Payer: Medicare HMO | Admitting: *Deleted

## 2019-03-23 DIAGNOSIS — I6930 Unspecified sequelae of cerebral infarction: Secondary | ICD-10-CM

## 2019-03-23 LAB — CUP PACEART REMOTE DEVICE CHECK
Date Time Interrogation Session: 20200922100629
Implantable Pulse Generator Implant Date: 20181002

## 2019-03-26 ENCOUNTER — Telehealth: Payer: Medicare HMO

## 2019-03-26 ENCOUNTER — Ambulatory Visit: Payer: Medicare HMO | Admitting: *Deleted

## 2019-03-26 DIAGNOSIS — M1612 Unilateral primary osteoarthritis, left hip: Secondary | ICD-10-CM

## 2019-03-26 DIAGNOSIS — E1165 Type 2 diabetes mellitus with hyperglycemia: Secondary | ICD-10-CM

## 2019-03-29 NOTE — Chronic Care Management (AMB) (Addendum)
Chronic Care Management   Follow Up Note   03/26/2019 Name: Caitlin Mccarthy MRN: [id number] DOB: 03-17-1936  Referred by: [name], FNP Reason for referral : Chronic Care Management (RN CCM follow up)   Caitlin Mccarthy is a 83 y.o. year old female who is a primary care patient of [name], FNP. The CCM team was consulted for assistance with chronic disease management and care coordination needs.    Review of patient status, including review of consultants reports, relevant laboratory and other test results, and collaboration with appropriate care team members and the patient's provider was performed as part of comprehensive patient evaluation and provision of chronic care management services. We also discussed that her prior UTI has resolved.    I spoke with Ms Caitlin Mccarthy by telephone. She is due for a visit with her PCP and is scheduled for 04/06/2019. She will need labwork, including an A1C at that visit and will need a refill on Tramadol. Today we discussed bilateral knee pain and bilateral lower leg pain.   Outpatient Encounter Medications as of 03/26/2019  Medication Sig  . amLODipine-benazepril (LOTREL) 10-40 MG capsule TAKE ONE CAPSULE EACH MORNING  . aspirin EC 81 MG tablet Take 1 tablet (81 mg total) by mouth daily.  . canagliflozin (INVOKANA) 100 MG TABS tablet Take 1 tablet (100 mg total) by mouth daily before breakfast.  . chlorthalidone (HYGROTON) 25 MG tablet TAKE 1/2 TABLET DAILY  . DULoxetine (CYMBALTA) 60 MG capsule Take 1 capsule (60 mg total) by mouth daily.  . fenofibrate (TRICOR) 145 MG tablet TAKE ONE (1) TABLET EACH DAY  . fluticasone (FLONASE) 50 MCG/ACT nasal spray Place 2 sprays into both nostrils daily.  . metFORMIN (GLUCOPHAGE-XR) 500 MG 24 hr tablet TAKE 2 TABLETS EVERY MORNING WITH BREAKFAST  . nystatin (MYCOSTATIN/NYSTOP) powder APPLY TOPICALLY FOUR TIMES DAILY  . omeprazole (PRILOSEC) 20 MG capsule Take 1 capsule (20 mg total) by mouth daily.   . ondansetron (ZOFRAN ODT) 4 MG disintegrating tablet Take 1 tablet (4 mg total) by mouth every 8 (eight) hours as needed for nausea or vomiting.  Caitlin Mccarthy OZEMPIC, 0.25 OR 0.5 MG/DOSE, 2 MG/1.5ML SOPN INJECT 0.5MG  SQ ONCE A WEEK  . PREMARIN vaginal cream Apply 1 application topically daily as needed (irritation).   . rosuvastatin (CRESTOR) 40 MG tablet TAKE ONE (1) TABLET EACH DAY  . traMADol (ULTRAM) 50 MG tablet Take 1 tablet (50 mg total) by mouth 4 (four) times daily. (Patient not taking: Reported on 03/26/2019)  . Vitamin D, Ergocalciferol, (DRISDOL) 1.25 MG (50000 UT) CAPS capsule Take 1 capsule (50,000 Units total) by mouth every 7 (seven) days.   No facility-administered encounter medications on file as of 03/26/2019.      Goals Addressed            This Visit's Progress     Patient Stated   . COMPLETED: "I want to make sure the bladder infection clears up" (pt-stated)       Current Barriers:  . Chronic Disease Management support and education needs related to recurrent UTIs  Nurse Case Manager Clinical Goal(s):  Caitlin Mccarthy Over the next 7 days, patient will complete Augmentin as prescribed for UTI . Over the next 7 days, after completion of Augmentin, patient will return a sterile urine specimen for UA to verify resolution  Interventions:  . Evaluation of current treatment plan related to UTI treatment and patient's adherence to plan as established by provider. . Advised patient to pick up  a sterile urine cup that will be left at the front window . Reviewed medications with patient and discussed Augmentin as current treatment and Keflex and Septra as previous treatments  Patient Self Care Activities:  . Currently UNABLE TO independently run errands . Calls provider office for new concerns or questions  Initial goal documentation  03/26/2019 Urine specimen was clear of infection. Urinary symptoms resolved.      Caitlin Mccarthy "I would like for the pain in my knees and legs to get better"  (pt-stated)       Current Barriers:  . Chronic Disease Management support and education needs related to cause of knee and lower leg pain.  Nurse Case Manager Clinical Goal(s):  Caitlin Mccarthy Over the next 15 days, patient will attend all scheduled medical appointments: Evelina Dun, FNP 04/06/2019  Interventions:  . Discussed HPI of pain o Bilateral knee pain and lower leg pain. Describes it as burning and says that they feel cold. Increased pain with walking. This is not a new problem. They have been hurting for "a while". o Tramadol helps the pain but she ran out . Verified upcoming appointment with PCP on 04/06/19 . Discussed medications o Taking ibuprofen 400mg  daily and Tylenol 500mg  once in the morning o Discussed risks of NSAID use in diabetics . Chart reviewed. Noted history of OA and bilateral knee replacements. Neuropathy or PAD not listed on problem list. Has history of CVA and is a smoker so PAD likely.  . Encouraged to seek emergency medical care if needed  Patient Self Care Activities:  . Performs ADL's independently . Performs IADL's independently  Initial goal documentation        Follow Up The care management team will reach out to the patient again over the next 30 days.  Follow up with provider re: Evelina Dun, FNP on 04/06/19 CCM contact information provided. Patient will reach out as needed.   Chong Sicilian, BSN, RN-BC Embedded Chronic Care Manager [facility] [place] [facility] Direct Dial: [phone]   I have reviewed and agree with the above  documentation.   Evelina Dun, FNP

## 2019-03-29 NOTE — Patient Instructions (Signed)
Visit Information  Goals Addressed            This Visit's Progress     Patient Stated   . COMPLETED: "I want to make sure the bladder infection clears up" (pt-stated)       Current Barriers:  . Chronic Disease Management support and education needs related to recurrent UTIs  Nurse Case Manager Clinical Goal(s):  [name] Over the next 7 days, patient will complete Augmentin as prescribed for UTI . Over the next 7 days, after completion of Augmentin, patient will return a sterile urine specimen for UA to verify resolution  Interventions:  . Evaluation of current treatment plan related to UTI treatment and patient's adherence to plan as established by provider. . Advised patient to pick up a sterile urine cup that will be left at the front window . Reviewed medications with patient and discussed Augmentin as current treatment and Keflex and Septra as previous treatments  Patient Self Care Activities:  . Currently UNABLE TO independently run errands . Calls provider office for new concerns or questions  Initial goal documentation  03/26/2019 Urine specimen was clear of infection. Urinary symptoms resolved.      [name] "I would like for the pain in my knees and legs to get better" (pt-stated)       Current Barriers:  . Chronic Disease Management support and education needs related to cause of knee and lower leg pain.  Nurse Case Manager Clinical Goal(s):  [name] Over the next 15 days, patient will attend all scheduled medical appointments: Evelina Dun, FNP 04/06/2019  Interventions:  . Discussed HPI of pain o Bilateral knee pain and lower leg pain. Describes it as burning and says that they feel cold. Increased pain with walking. This is not a new problem. They have been hurting for "a while". o Tramadol helps the pain but she ran out . Verified upcoming appointment with PCP on 04/06/19 . Discussed medications o Taking ibuprofen 400mg  daily and Tylenol 500mg  once in the morning o Discussed  risks of NSAID use in diabetics . Chart reviewed. Noted history of OA and bilateral knee replacements. Neuropathy or PAD not listed on problem list. Has history of CVA and is a smoker so PAD likely.  . Encouraged to seek emergency medical care if needed  Patient Self Care Activities:  . Performs ADL's independently . Performs IADL's independently  Initial goal documentation        The patient verbalized understanding of instructions provided today and declined a print copy of patient instruction materials.    Follow Up The care management team will reach out to the patient again over the next 30 days.  Follow up with provider re: Evelina Dun, FNP on 04/06/19 CCM contact information provided. Patient will reach out as needed.   Chong Sicilian, BSN, RN-BC Embedded Chronic Care Manager [facility] [place] [facility] Direct Dial: [phone]

## 2019-04-01 DIAGNOSIS — N3020 Other chronic cystitis without hematuria: Secondary | ICD-10-CM | POA: Diagnosis not present

## 2019-04-01 DIAGNOSIS — N200 Calculus of kidney: Secondary | ICD-10-CM | POA: Diagnosis not present

## 2019-04-01 DIAGNOSIS — R1084 Generalized abdominal pain: Secondary | ICD-10-CM | POA: Diagnosis not present

## 2019-04-01 DIAGNOSIS — N952 Postmenopausal atrophic vaginitis: Secondary | ICD-10-CM | POA: Diagnosis not present

## 2019-04-01 NOTE — Progress Notes (Signed)
Carelink Summary Report / Loop Recorder 

## 2019-04-02 ENCOUNTER — Other Ambulatory Visit: Payer: Self-pay | Admitting: Family

## 2019-04-05 ENCOUNTER — Other Ambulatory Visit: Payer: Self-pay

## 2019-04-06 ENCOUNTER — Ambulatory Visit (INDEPENDENT_AMBULATORY_CARE_PROVIDER_SITE_OTHER): Payer: Medicare HMO | Admitting: Family

## 2019-04-06 ENCOUNTER — Encounter: Payer: Self-pay | Admitting: Family

## 2019-04-06 VITALS — BP 129/71 | HR 74 | Temp 96.6°F | Ht 70.0 in | Wt 195.6 lb

## 2019-04-06 DIAGNOSIS — E1165 Type 2 diabetes mellitus with hyperglycemia: Secondary | ICD-10-CM

## 2019-04-06 DIAGNOSIS — E663 Overweight: Secondary | ICD-10-CM

## 2019-04-06 DIAGNOSIS — R350 Frequency of micturition: Secondary | ICD-10-CM | POA: Diagnosis not present

## 2019-04-06 DIAGNOSIS — M1612 Unilateral primary osteoarthritis, left hip: Secondary | ICD-10-CM | POA: Diagnosis not present

## 2019-04-06 DIAGNOSIS — F17200 Nicotine dependence, unspecified, uncomplicated: Secondary | ICD-10-CM | POA: Diagnosis not present

## 2019-04-06 DIAGNOSIS — Z23 Encounter for immunization: Secondary | ICD-10-CM | POA: Diagnosis not present

## 2019-04-06 DIAGNOSIS — Z0289 Encounter for other administrative examinations: Secondary | ICD-10-CM

## 2019-04-06 DIAGNOSIS — E1142 Type 2 diabetes mellitus with diabetic polyneuropathy: Secondary | ICD-10-CM

## 2019-04-06 DIAGNOSIS — I10 Essential (primary) hypertension: Secondary | ICD-10-CM | POA: Diagnosis not present

## 2019-04-06 LAB — BAYER DCA HB A1C WAIVED: HB A1C (BAYER DCA - WAIVED): 5.9 % (ref <7.0)

## 2019-04-06 MED ORDER — TRAMADOL HCL 50 MG PO TABS: 50.0000 mg | ORAL_TABLET | Freq: Four times a day (QID) | ORAL | 2 refills | Status: DC

## 2019-04-06 MED ORDER — GABAPENTIN 100 MG PO CAPS: 100.0000 mg | ORAL_CAPSULE | Freq: Three times a day (TID) | ORAL | 3 refills | Status: DC

## 2019-04-06 NOTE — Progress Notes (Signed)
Subjective:    Patient ID: Caitlin Mccarthy, female    DOB: 01/02/1936, 83 y.o.   MRN: [id number]  Chief Complaint  Patient presents with  . burn,tingling feet,  joint pain   PT presents to the office today with bilateral feet burning pain of 9 out 10. She reports this has been going over a year and seems to becoming worse. She has taken tylenol with no relief.   She reports her blood sugars have been really good since starting the Ozempic. However, she states she can not afford it because it has been over $200 a month. She is also complaining of chronic nausea since starting it.  Diabetes She presents for her follow-up diabetic visit. She has type 2 diabetes mellitus. Associated symptoms include fatigue and foot paresthesias. Pertinent negatives for diabetes include no blurred vision and no visual change. Diabetic complications include heart disease. Risk factors for coronary artery disease include diabetes mellitus, dyslipidemia, hypertension, sedentary lifestyle and post-menopausal. She is following a diabetic diet. Her overall blood glucose range is 90-110 mg/dl. Eye exam is not current.  Hypertension This is a chronic problem. The current episode started more than 1 year ago. The problem has been resolved since onset. The problem is controlled. Pertinent negatives include no blurred vision, peripheral edema or shortness of breath. Risk factors for coronary artery disease include dyslipidemia, obesity and sedentary lifestyle.  Arthritis Presents for follow-up visit. She complains of stiffness and joint warmth. The symptoms have been stable. Affected locations include the right knee, left knee, right MCP and left MCP. Her pain is at a severity of 6/10. Associated symptoms include fatigue.      Review of Systems  Constitutional: Positive for fatigue.  Eyes: Negative for blurred vision.  Respiratory: Negative for shortness of breath.   Musculoskeletal: Positive for arthritis and  stiffness.  All other systems reviewed and are negative.      Objective:   Physical Exam Vitals signs reviewed.  Constitutional:      General: She is not in acute distress.    Appearance: She is well-developed.  HENT:     Head: Normocephalic and atraumatic.     Right Ear: Tympanic membrane normal.     Left Ear: Tympanic membrane normal.  Eyes:     Pupils: Pupils are equal, round, and reactive to light.  Neck:     Musculoskeletal: Normal range of motion and neck supple.     Thyroid: No thyromegaly.  Cardiovascular:     Rate and Rhythm: Normal rate and regular rhythm.     Heart sounds: Normal heart sounds. No murmur.  Pulmonary:     Effort: Pulmonary effort is normal. No respiratory distress.     Breath sounds: Normal breath sounds. No wheezing.  Abdominal:     General: Bowel sounds are normal. There is no distension.     Palpations: Abdomen is soft.     Tenderness: There is no abdominal tenderness.  Musculoskeletal:        General: No tenderness.     Comments: Using rolling walker  Skin:    General: Skin is warm and dry.  Neurological:     Mental Status: She is alert and oriented to person, place, and time.     Cranial Nerves: No cranial nerve deficit.     Deep Tendon Reflexes: Reflexes are normal and symmetric.  Psychiatric:        Behavior: Behavior normal.        Thought Content: Thought content  normal.        Judgment: Judgment normal.     BP 129/71   Pulse 74   Temp (!) 96.6 F (35.9 C) (Temporal)   Ht _0  (1.778 m)   Wt 195 lb 9.6 oz (88.7 kg)   SpO2 96%   BMI 28.07 kg/m      Assessment & Plan:  Caitlin Mccarthy comes in today with chief complaint of burn,tingling feet,  joint pain   Diagnosis and orders addressed:  1. Essential hypertension, benign - CMP14+EGFR - CBC with Differential/Platelet  2. Type 2 diabetes mellitus with hyperglycemia, without long-term current use of insulin ([place]) Will stop Ozempic today given cost and nausea -  CMP14+EGFR - CBC with Differential/Platelet - Bayer DCA Hb A1c Waived - Microalbumin / creatinine urine ratio - gabapentin (NEURONTIN) 100 MG capsule; Take 1 capsule (100 mg total) by mouth 3 (three) times daily.  Dispense: 90 capsule; Refill: 3  3. Overweight (BMI 25.0-29.9) - CMP14+EGFR - CBC with Differential/Platelet  4. Current smoker Smoking cessation discussed - CMP14+EGFR - CBC with Differential/Platelet  5. Diabetic polyneuropathy associated with type 2 diabetes mellitus (HCC) Gabapentin started today Sedation precautions discussed - CMP14+EGFR - CBC with Differential/Platelet - gabapentin (NEURONTIN) 100 MG capsule; Take 1 capsule (100 mg total) by mouth 3 (three) times daily.  Dispense: 90 capsule; Refill: 3  6. Primary osteoarthritis of left hip - traMADol (ULTRAM) 50 MG tablet; Take 1 tablet (50 mg total) by mouth 4 (four) times daily.  Dispense: 60 tablet; Refill: 2  7. Pain management contract signed - traMADol (ULTRAM) 50 MG tablet; Take 1 tablet (50 mg total) by mouth 4 (four) times daily.  Dispense: 60 tablet; Refill: 2  8. Urinary frequency - Urinalysis, Complete - Urine Culture   Labs pending Pt reviewed in [place] controlled- No red flags noted  Health Maintenance reviewed Diet and exercise encouraged  Follow up plan: 2 months to recheck DM and  neuropathy   Evelina Dun, FNP

## 2019-04-06 NOTE — Patient Instructions (Signed)

## 2019-04-07 LAB — CMP14+EGFR
ALT: 23 IU/L (ref 0–32)
AST: 24 IU/L (ref 0–40)
Albumin/Globulin Ratio: 1.7 (ref 1.2–2.2)
Albumin: 4.8 g/dL — ABNORMAL HIGH (ref 3.7–4.7)
Alkaline Phosphatase: 51 IU/L (ref 39–117)
BUN/Creatinine Ratio: 26 (ref 12–28)
BUN: 23 mg/dL (ref 8–27)
Bilirubin Total: 0.4 mg/dL (ref 0.0–1.2)
CO2: 26 mmol/L (ref 20–29)
Calcium: 11.7 mg/dL — ABNORMAL HIGH (ref 8.7–10.3)
Chloride: 101 mmol/L (ref 96–106)
Creatinine, Ser: 0.88 mg/dL (ref 0.57–1.00)
GFR calc Af Amer: 72 mL/min/{1.73_m2} (ref >59)
GFR calc non Af Amer: 62 mL/min/{1.73_m2} (ref >59)
Globulin, Total: 2.8 g/dL (ref 1.5–4.5)
Glucose: 103 mg/dL — ABNORMAL HIGH (ref 65–99)
Potassium: 4.6 mmol/L (ref 3.5–5.2)
Sodium: 139 mmol/L (ref 134–144)
Total Protein: 7.6 g/dL (ref 6.0–8.5)

## 2019-04-07 LAB — CBC WITH DIFFERENTIAL/PLATELET
Basophils Absolute: 0 10*3/uL (ref 0.0–0.2)
Basos: 1 %
EOS (ABSOLUTE): 0.1 10*3/uL (ref 0.0–0.4)
Eos: 1 %
Hematocrit: 40.2 % (ref 34.0–46.6)
Hemoglobin: 13.3 g/dL (ref 11.1–15.9)
Immature Grans (Abs): 0 10*3/uL (ref 0.0–0.1)
Immature Granulocytes: 0 %
Lymphocytes Absolute: 3.1 10*3/uL (ref 0.7–3.1)
Lymphs: 49 %
MCH: 30.6 pg (ref 26.6–33.0)
MCHC: 33.1 g/dL (ref 31.5–35.7)
MCV: 92 fL (ref 79–97)
Monocytes Absolute: 0.6 10*3/uL (ref 0.1–0.9)
Monocytes: 9 %
Neutrophils Absolute: 2.5 10*3/uL (ref 1.4–7.0)
Neutrophils: 40 %
Platelets: 235 10*3/uL (ref 150–450)
RBC: 4.35 x10E6/uL (ref 3.77–5.28)
RDW: 12.3 % (ref 11.7–15.4)
WBC: 6.2 10*3/uL (ref 3.4–10.8)

## 2019-04-07 LAB — URINE CULTURE: Organism ID, Bacteria: NO GROWTH

## 2019-04-07 LAB — MICROALBUMIN / CREATININE URINE RATIO
Creatinine, Urine: 78.3 mg/dL
Microalb/Creat Ratio: 41 mg/g creat — ABNORMAL HIGH (ref 0–29)
Microalbumin, Urine: 32.2 ug/mL

## 2019-04-08 ENCOUNTER — Other Ambulatory Visit: Payer: Self-pay | Admitting: Family

## 2019-04-08 DIAGNOSIS — J439 Emphysema, unspecified: Secondary | ICD-10-CM

## 2019-04-08 DIAGNOSIS — F17200 Nicotine dependence, unspecified, uncomplicated: Secondary | ICD-10-CM

## 2019-04-09 ENCOUNTER — Other Ambulatory Visit: Payer: Self-pay | Admitting: Family

## 2019-04-09 DIAGNOSIS — J439 Emphysema, unspecified: Secondary | ICD-10-CM

## 2019-04-15 DIAGNOSIS — H52223 Regular astigmatism, bilateral: Secondary | ICD-10-CM | POA: Diagnosis not present

## 2019-04-15 DIAGNOSIS — H524 Presbyopia: Secondary | ICD-10-CM | POA: Diagnosis not present

## 2019-04-25 LAB — CUP PACEART REMOTE DEVICE CHECK
Date Time Interrogation Session: 20201025100720
Implantable Pulse Generator Implant Date: 20181002

## 2019-04-26 ENCOUNTER — Ambulatory Visit (INDEPENDENT_AMBULATORY_CARE_PROVIDER_SITE_OTHER): Payer: Medicare HMO | Admitting: *Deleted

## 2019-04-26 DIAGNOSIS — I6930 Unspecified sequelae of cerebral infarction: Secondary | ICD-10-CM

## 2019-04-27 NOTE — Progress Notes (Deleted)
Office Visit Note  Patient: Caitlin Mccarthy             Date of Birth: [birth year]           MRN: [id number]             PCP: [name], FNP Referring: [name], FNP Visit Date: 05/10/2019 Occupation: @GUAROCC @  Subjective:  No chief complaint on file.   History of Present Illness: Caitlin Mccarthy is a 83 y.o. female ***   Activities of Daily Living:  Patient reports morning stiffness for *** {minute/hour:19697}.   Patient {ACTIONS;DENIES/REPORTS:21021675::"Denies"} nocturnal pain.  Difficulty dressing/grooming: {ACTIONS;DENIES/REPORTS:21021675::"Denies"} Difficulty climbing stairs: {ACTIONS;DENIES/REPORTS:21021675::"Denies"} Difficulty getting out of chair: {ACTIONS;DENIES/REPORTS:21021675::"Denies"} Difficulty using hands for taps, buttons, cutlery, and/or writing: {ACTIONS;DENIES/REPORTS:21021675::"Denies"}  No Rheumatology ROS completed.   PMFS History:  Patient Active Problem List   Diagnosis Date Noted  . Pain management contract signed 06/11/2018  . Vocal cord nodule 01/11/2017  . Late effect of cerebrovascular accident (CVA) 01/10/2017  . OA (osteoarthritis) 01/09/2017  . History of kidney stones 01/09/2017  . Hyperlipidemia 01/09/2017  . Embolic stroke ([employer]) [id number]  . Glaucoma 05/10/2016  . Vitamin D deficiency 05/10/2016  . Current smoker 05/10/2016  . Osteoarthritis of both hands 05/10/2016  . History of total right hip replacement 05/10/2016  . History of total knee replacement, bilateral 05/10/2016  . Osteoporosis 05/09/2016  . Type 2 diabetes mellitus ([place]) 02/05/2016  . Overweight (BMI 25.0-29.9) 12/28/2015  . GAD (generalized anxiety disorder) 12/28/2015  . Hypokalemia 06/02/2015  . Chronic pain 04/13/2015  . Essential hypertension, benign 10/02/2012  . GERD (gastroesophageal reflux disease) 10/02/2012  . Depression 10/02/2012  . OA (osteoarthritis) of hip 10/28/2011    Past Medical History:  Diagnosis Date  . Anxiety    . Depression   . GERD (gastroesophageal reflux disease)   . History of acute pyelonephritis    last episode 04-13-2015  w/ sepsis  . History of kidney stones   . History of recurrent UTIs    MULTIPLE  . Hyperlipidemia   . Hypertension   . OA (osteoarthritis)    knees, hips, hands  . Osteoporosis   . Poor memory    especially when has uti  . Renal calculus, left   . Stroke ([place])   . Type 2 diabetes mellitus ([place])   . Vitamin D deficiency 05/10/2016    Family History  Problem Relation Age of Onset  . Kidney disease Mother   . Diabetes Mother   . Congestive Heart Failure Father   . Heart failure Father   . Heart disease Brother   . Alcohol abuse Brother   . Anesthesia problems Neg Hx   . Hypotension Neg Hx   . Malignant hyperthermia Neg Hx   . Pseudochol deficiency Neg Hx    Past Surgical History:  Procedure Laterality Date  . CATARACT EXTRACTION W/PHACO  06/06/2011   Procedure: CATARACT EXTRACTION PHACO AND INTRAOCULAR LENS PLACEMENT (IOC);  Surgeon: Tonny Branch;  Location: [facility];  Service: Ophthalmology;  Laterality: Right;  CDE=12.77  . CATARACT EXTRACTION W/PHACO  06/27/2011   Procedure: CATARACT EXTRACTION PHACO AND INTRAOCULAR LENS PLACEMENT (IOC);  Surgeon: Tonny Branch;  Location: [facility];  Service: Ophthalmology;  Laterality: Left;  CDE:13.96  . CYSTOSCOPY WITH URETEROSCOPY AND STENT PLACEMENT Right 04/27/2015   Procedure: CYSTOSCOPY WITH RIGHT URETEROSCOPY, BASKET REMOVAL OF STONE, REMOVAL OF RIGHT NEPHROSTOMY TUBE;  Surgeon: Irine Seal, MD;  Location: [facility];  Service: Urology;  Laterality:  Right;  [name] CYSTOSCOPY/URETEROSCOPY/HOLMIUM LASER/STENT PLACEMENT Left 12/24/2016   Procedure: LEFT URETEROSCOPY WITH HOLMIUM LASER AND STENT PLACEMENT;  Surgeon: Irine Seal, MD;  Location: [facility];  Service: Urology;  Laterality: Left;  . EXTRACORPOREAL SHOCK WAVE LITHOTRIPSY  left 04-01-2016;  1980's  . FRACTURE SURGERY    . HIP ARTHROPLASTY     . HOLMIUM LASER APPLICATION Right [id number]   Procedure: HOLMIUM LASER APPLICATION;  Surgeon: Irine Seal, MD;  Location: [facility];  Service: Urology;  Laterality: Right;  . KNEE ARTHROPLASTY    . KNEE ARTHROSCOPY Bilateral right 2005//  left ?  [name] LOOP RECORDER INSERTION N/A 04/01/2017   Procedure: LOOP RECORDER INSERTION;  Surgeon: Thompson Grayer, MD;  Location: [facility];  Service: Cardiovascular;  Laterality: N/A;  . TOTAL HIP ARTHROPLASTY  10/28/2011   Procedure: TOTAL HIP ARTHROPLASTY;  Surgeon: Gearlean Alf, MD;  Location: [facility];  Service: Orthopedics;  Laterality: Right;  . TOTAL KNEE ARTHROPLASTY Bilateral left 03-09-2007//  right 2006  . TRANSTHORACIC ECHOCARDIOGRAM  10/06/2006   normal echo,  ef 55-60%  . TUBAL LIGATION     Social History   Social History Narrative   Lives with husband   Immunization History  Administered Date(s) Administered  . Fluad Quad(high Dose 65+) 04/06/2019  . Influenza, High Dose Seasonal PF 04/21/2017, 04/06/2018  . Influenza,inj,Quad PF,6+ Mos 04/02/2013, 04/06/2014, 04/14/2015, 04/05/2016  . Influenza,inj,quad, With Preservative 03/31/2017, 03/31/2018  . Pneumococcal Conjugate-13 11/24/2014  . Pneumococcal Polysaccharide-23 06/08/2003, 10/12/2010  . Pneumococcal-Unspecified 03/31/2017  . Td 10/12/2010  . Tdap 10/12/2010     Objective: Vital Signs: There were no vitals taken for this visit.   Physical Exam   Musculoskeletal Exam: ***  CDAI Exam: CDAI Score: - Patient Global: -; Provider Global: - Swollen: -; Tender: - Joint Exam   No joint exam has been documented for this visit   There is currently no information documented on the homunculus. Go to the Rheumatology activity and complete the homunculus joint exam.  Investigation: No additional findings.  Imaging: No results found.  Recent Labs: Lab Results  Component Value Date   WBC 6.2 04/06/2019   HGB 13.3 04/06/2019   PLT 235 04/06/2019    NA 139 04/06/2019   K 4.6 04/06/2019   CL 101 04/06/2019   CO2 26 04/06/2019   GLUCOSE 103 (H) 04/06/2019   BUN 23 04/06/2019   CREATININE 0.88 04/06/2019   BILITOT 0.4 04/06/2019   ALKPHOS 51 04/06/2019   AST 24 04/06/2019   ALT 23 04/06/2019   PROT 7.6 04/06/2019   ALBUMIN 4.8 (H) 04/06/2019   CALCIUM 11.7 (H) 04/06/2019   GFRAA 72 04/06/2019    Speciality Comments: No specialty comments available.  Procedures:  No procedures performed Allergies: Iodinated diagnostic agents and Latex   Assessment / Plan:     Visit Diagnoses: No diagnosis found.  Orders: No orders of the defined types were placed in this encounter.  No orders of the defined types were placed in this encounter.   Face-to-face time spent with patient was *** minutes. Greater than 50% of time was spent in counseling and coordination of care.  Follow-Up Instructions: No follow-ups on file.   Earnestine Mealing, CMA  Note - This record has been created using Editor, commissioning.  Chart creation errors have been sought, but may not always  have been located. Such creation errors do not reflect on  the standard of medical care.

## 2019-04-29 ENCOUNTER — Telehealth: Payer: Medicare HMO

## 2019-05-03 ENCOUNTER — Other Ambulatory Visit: Payer: Self-pay

## 2019-05-03 ENCOUNTER — Ambulatory Visit (HOSPITAL_COMMUNITY)
Admission: RE | Admit: 2019-05-03 | Discharge: 2019-05-03 | Disposition: A | Discharge status: Home / Self Care | Payer: Medicare HMO | Source: Ambulatory Visit | Attending: Family | Admitting: Family

## 2019-05-03 DIAGNOSIS — J439 Emphysema, unspecified: Secondary | ICD-10-CM | POA: Diagnosis not present

## 2019-05-03 DIAGNOSIS — R918 Other nonspecific abnormal finding of lung field: Secondary | ICD-10-CM | POA: Diagnosis not present

## 2019-05-08 ENCOUNTER — Other Ambulatory Visit: Payer: Self-pay | Admitting: Cardiovascular Disease

## 2019-05-10 ENCOUNTER — Ambulatory Visit: Payer: Self-pay | Admitting: Rheumatology

## 2019-05-13 ENCOUNTER — Ambulatory Visit: Payer: Medicare HMO | Admitting: *Deleted

## 2019-05-13 ENCOUNTER — Encounter: Payer: Self-pay | Admitting: *Deleted

## 2019-05-14 NOTE — Progress Notes (Signed)
Carelink Summary Report / Loop Recorder 

## 2019-05-25 ENCOUNTER — Telehealth: Payer: Self-pay | Admitting: Cardiovascular Disease

## 2019-05-25 NOTE — Telephone Encounter (Signed)
Virtual Visit Pre-Appointment Phone Call  "(Name), I am calling you today to discuss your upcoming appointment. We are currently trying to limit exposure to the virus that causes COVID-19 by seeing patients at home rather than in the office."  1. "What is the BEST phone number to call the day of the visit?" - include this in appointment notes  2. Do you have or have access to (through a family member/friend) a smartphone with video capability that we can use for your visit?" a. If yes - list this number in appt notes as cell (if different from BEST phone #) and list the appointment type as a VIDEO visit in appointment notes b. If no - list the appointment type as a PHONE visit in appointment notes  3. Confirm consent - "In the setting of the current Covid19 crisis, you are scheduled for a (phone or video) visit with your provider on (date) at (time).  Just as we do with many in-office visits, in order for you to participate in this visit, we must obtain consent.  If you'd like, I can send this to your mychart (if signed up) or email for you to review.  Otherwise, I can obtain your verbal consent now.  All virtual visits are billed to your insurance company just like a normal visit would be.  By agreeing to a virtual visit, we'd like you to understand that the technology does not allow for your provider to perform an examination, and thus may limit your provider's ability to fully assess your condition. If your provider identifies any concerns that need to be evaluated in person, we will make arrangements to do so.  Finally, though the technology is pretty good, we cannot assure that it will always work on either your or our end, and in the setting of a video visit, we may have to convert it to a phone-only visit.  In either situation, we cannot ensure that we have a secure connection.  Are you willing to proceed?" STAFF: Did the patient verbally acknowledge consent to telehealth visit? Document  YES/NO here: yes  4. Advise patient to be prepared - "Two hours prior to your appointment, go ahead and check your blood pressure, pulse, oxygen saturation, and your weight (if you have the equipment to check those) and write them all down. When your visit starts, your provider will ask you for this information. If you have an Apple Watch or Kardia device, please plan to have heart rate information ready on the day of your appointment. Please have a pen and paper handy nearby the day of the visit as well."  5. Give patient instructions for MyChart download to smartphone OR Doximity/Doxy.me as below if video visit (depending on what platform provider is using)  6. Inform patient they will receive a phone call 15 minutes prior to their appointment time (may be from unknown caller ID) so they should be prepared to answer    [place] has been deemed a candidate for a follow-up tele-health visit to limit community exposure during the Covid-19 pandemic. I spoke with the patient via phone to ensure availability of phone/video source, confirm preferred email & phone number, and discuss instructions and expectations.  I reminded [name] to be prepared with any vital sign and/or heart rhythm information that could potentially be obtained via home monitoring, at the time of her visit. I reminded [name] to expect a phone call prior to  her visit.  Weston Anna 05/25/2019 11:46 AM   INSTRUCTIONS FOR DOWNLOADING THE MYCHART APP TO SMARTPHONE  - The patient must first make sure to have activated MyChart and know their login information - If Apple, go to [employer] and type in MyChart in the search bar and download the app. If Android, ask patient to go to [employer] and type in [place] [facility] in the search bar and download the app. The app is free but as with any other app downloads, their phone may require them to verify saved payment information or  Apple/Android password.  - The patient will need to then log into the app with their MyChart username and password, and select [facility] as their healthcare provider to link the account. When it is time for your visit, go to the MyChart app, find appointments, and click Begin Video Visit. Be sure to Select Allow for your device to access the Microphone and Camera for your visit. You will then be connected, and your provider will be with you shortly.  **If they have any issues connecting, or need assistance please contact MyChart service desk [phone])**  **If using a computer, in order to ensure the best quality for their visit they will need to use either of the following Internet Browsers: [employer], or Google Chrome**  IF USING DOXIMITY or DOXY.ME - The patient will receive a link just prior to their visit by text.     FULL LENGTH CONSENT FOR TELE-HEALTH VISIT   I hereby voluntarily request, consent and authorize [facility] and its employed or contracted physicians, physician assistants, nurse practitioners or other licensed health care professionals (the Practitioner), to provide me with telemedicine health care services (the Services") as deemed necessary by the treating Practitioner. I acknowledge and consent to receive the Services by the Practitioner via telemedicine. I understand that the telemedicine visit will involve communicating with the Practitioner through live audiovisual communication technology and the disclosure of certain medical information by electronic transmission. I acknowledge that I have been given the opportunity to request an in-person assessment or other available alternative prior to the telemedicine visit and am voluntarily participating in the telemedicine visit.  I understand that I have the right to withhold or withdraw my consent to the use of telemedicine in the course of my care at any time, without affecting my right to future care  or treatment, and that the Practitioner or I may terminate the telemedicine visit at any time. I understand that I have the right to inspect all information obtained and/or recorded in the course of the telemedicine visit and may receive copies of available information for a reasonable fee.  I understand that some of the potential risks of receiving the Services via telemedicine include:   Delay or interruption in medical evaluation due to technological equipment failure or disruption;  Information transmitted may not be sufficient (e.g. poor resolution of images) to allow for appropriate medical decision making by the Practitioner; and/or   In rare instances, security protocols could fail, causing a breach of personal health information.  Furthermore, I acknowledge that it is my responsibility to provide information about my medical history, conditions and care that is complete and accurate to the best of my ability. I acknowledge that Practitioner's advice, recommendations, and/or decision may be based on factors not within their control, such as incomplete or inaccurate data provided by me or distortions of diagnostic images or specimens that may result from electronic transmissions. I  understand that the practice of medicine is not an exact science and that Practitioner makes no warranties or guarantees regarding treatment outcomes. I acknowledge that I will receive a copy of this consent concurrently upon execution via email to the email address I last provided but may also request a printed copy by calling the office of [facility].    I understand that my insurance will be billed for this visit.   I have read or had this consent read to me.  I understand the contents of this consent, which adequately explains the benefits and risks of the Services being provided via telemedicine.   I have been provided ample opportunity to ask questions regarding this consent and the Services and have had  my questions answered to my satisfaction.  I give my informed consent for the services to be provided through the use of telemedicine in my medical care  By participating in this telemedicine visit I agree to the above.

## 2019-05-28 LAB — CUP PACEART REMOTE DEVICE CHECK
Date Time Interrogation Session: 20201127090157
Implantable Pulse Generator Implant Date: 20181002

## 2019-05-31 ENCOUNTER — Ambulatory Visit (INDEPENDENT_AMBULATORY_CARE_PROVIDER_SITE_OTHER): Payer: Medicare HMO | Admitting: *Deleted

## 2019-05-31 DIAGNOSIS — I63439 Cerebral infarction due to embolism of unspecified posterior cerebral artery: Secondary | ICD-10-CM | POA: Diagnosis not present

## 2019-06-03 ENCOUNTER — Telehealth (INDEPENDENT_AMBULATORY_CARE_PROVIDER_SITE_OTHER): Payer: Medicare HMO | Admitting: Cardiovascular Disease

## 2019-06-03 ENCOUNTER — Encounter: Payer: Self-pay | Admitting: Cardiovascular Disease

## 2019-06-03 VITALS — Ht 70.0 in | Wt 195.0 lb

## 2019-06-03 DIAGNOSIS — E785 Hyperlipidemia, unspecified: Secondary | ICD-10-CM

## 2019-06-03 DIAGNOSIS — Z8673 Personal history of transient ischemic attack (TIA), and cerebral infarction without residual deficits: Secondary | ICD-10-CM

## 2019-06-03 DIAGNOSIS — I499 Cardiac arrhythmia, unspecified: Secondary | ICD-10-CM | POA: Diagnosis not present

## 2019-06-03 DIAGNOSIS — I10 Essential (primary) hypertension: Secondary | ICD-10-CM

## 2019-06-03 NOTE — Patient Instructions (Signed)
Medication Instructions:  Continue all current medications.  Labwork: none  Testing/Procedures: none  Follow-Up: As needed.    Any Other Special Instructions Will Be Listed Below (If Applicable).  If you need a refill on your cardiac medications before your next appointment, please call your pharmacy.  

## 2019-06-03 NOTE — Progress Notes (Signed)
Virtual Visit via Telephone Note   This visit type was conducted due to national recommendations for restrictions regarding the COVID-19 Pandemic (e.g. social distancing) in an effort to limit this patient's exposure and mitigate transmission in our community.  Due to her co-morbid illnesses, this patient is at least at moderate risk for complications without adequate follow up.  This format is felt to be most appropriate for this patient at this time.  The patient did not have access to video technology/had technical difficulties with video requiring transitioning to audio format only (telephone).  All issues noted in this document were discussed and addressed.  No physical exam could be performed with this format.  Please refer to the patient's chart for her  consent to telehealth for [facility].   Date:  06/03/2019   ID:  Caitlin Mccarthy, DOB 08/20/1935, MRN [id number]  Patient Location: Home Provider Location: Office  PCP:  [name], FNP  Cardiologist:  Kate Sable, MD  Electrophysiologist:  None   Evaluation Performed:  Follow-Up Visit  Chief Complaint:  Arrhythmia  History of Present Illness:    Caitlin Mccarthy is a 83 y.o. female with history of CVA.  Most recent device interrogation in November 2020 showed no arrhythmias and specifically no evidence of atrial fibrillation.  The patient denies any symptoms of chest pain, palpitations, shortness of breath, lightheadedness, dizziness, leg swelling, orthopnea, PND, and syncope.  She's been doing some housework.   Soc Hx: Her daughter who usually accompanies her, [place], owns [employer] in [place].  Her son is studying business at [employer] and plays golf for the college team.   Past Medical History:  Diagnosis Date   Anxiety    Depression    GERD (gastroesophageal reflux disease)    History of acute pyelonephritis    last episode 04-13-2015  w/ sepsis   History of kidney  stones    History of recurrent UTIs    MULTIPLE   Hyperlipidemia    Hypertension    OA (osteoarthritis)    knees, hips, hands   Osteoporosis    Poor memory    especially when has uti   Renal calculus, left    Stroke ([place])    Type 2 diabetes mellitus ([place])    Vitamin D deficiency 05/10/2016   Past Surgical History:  Procedure Laterality Date   CATARACT EXTRACTION W/PHACO  06/06/2011   Procedure: CATARACT EXTRACTION PHACO AND INTRAOCULAR LENS PLACEMENT ([place]);  Surgeon: Tonny Branch;  Location: [facility];  Service: Ophthalmology;  Laterality: Right;  CDE=12.77   CATARACT EXTRACTION W/PHACO  06/27/2011   Procedure: CATARACT EXTRACTION PHACO AND INTRAOCULAR LENS PLACEMENT ([facility]);  Surgeon: Tonny Branch;  Location: [facility];  Service: Ophthalmology;  Laterality: Left;  CDE:13.96   CYSTOSCOPY WITH URETEROSCOPY AND STENT PLACEMENT Right 04/27/2015   Procedure: CYSTOSCOPY WITH RIGHT URETEROSCOPY, BASKET REMOVAL OF STONE, REMOVAL OF RIGHT NEPHROSTOMY TUBE;  Surgeon: Irine Seal, MD;  Location: [facility];  Service: Urology;  Laterality: Right;   CYSTOSCOPY/URETEROSCOPY/HOLMIUM LASER/STENT PLACEMENT Left 12/24/2016   Procedure: LEFT URETEROSCOPY WITH HOLMIUM LASER AND STENT PLACEMENT;  Surgeon: Irine Seal, MD;  Location[facility];  Service: Urology;  Laterality: Left;   EXTRACORPOREAL SHOCK WAVE LITHOTRIPSY  left 04-01-2016;  1980's   FRACTURE SURGERY     HIP ARTHROPLASTY     HOLMIUM LASER APPLICATION Right [id number]   Procedure: HOLMIUM LASER APPLICATION;  Surgeon: Irine Seal, MD;  Location: [facility];  Service: Urology;  Laterality: Right;   KNEE ARTHROPLASTY     KNEE ARTHROSCOPY Bilateral right 2005//  left ?   LOOP RECORDER INSERTION N/A 04/01/2017   Procedure: LOOP RECORDER INSERTION;  Surgeon: Thompson Grayer, MD;  Location: [facility];  Service: Cardiovascular;  Laterality: N/A;   TOTAL HIP ARTHROPLASTY  10/28/2011    Procedure: TOTAL HIP ARTHROPLASTY;  Surgeon: Gearlean Alf, MD;  Location: [facility];  Service: Orthopedics;  Laterality: Right;   TOTAL KNEE ARTHROPLASTY Bilateral left 03-09-2007//  right 2006   TRANSTHORACIC ECHOCARDIOGRAM  10/06/2006   normal echo,  ef 55-60%   TUBAL LIGATION       Current Meds  Medication Sig   amLODipine-benazepril (LOTREL) 10-40 MG capsule TAKE ONE CAPSULE EACH MORNING   aspirin EC 81 MG tablet Take 1 tablet (81 mg total) by mouth daily.   chlorthalidone (HYGROTON) 25 MG tablet TAKE 1/2 TABLET DAILY   DULoxetine (CYMBALTA) 60 MG capsule Take 1 capsule (60 mg total) by mouth daily.   fenofibrate (TRICOR) 145 MG tablet TAKE ONE (1) TABLET EACH DAY   fluticasone (FLONASE) 50 MCG/ACT nasal spray Place 2 sprays into both nostrils daily.   gabapentin (NEURONTIN) 100 MG capsule Take 100 mg by mouth daily.   metFORMIN (GLUCOPHAGE-XR) 500 MG 24 hr tablet TAKE 2 TABLETS EVERY MORNING WITH BREAKFAST   nystatin (MYCOSTATIN/NYSTOP) powder APPLY TOPICALLY FOUR TIMES DAILY   omeprazole (PRILOSEC) 20 MG capsule Take 1 capsule (20 mg total) by mouth daily.   ondansetron (ZOFRAN ODT) 4 MG disintegrating tablet Take 1 tablet (4 mg total) by mouth every 8 (eight) hours as needed for nausea or vomiting.   PREMARIN vaginal cream Apply 1 application topically daily as needed (irritation).    rosuvastatin (CRESTOR) 40 MG tablet TAKE ONE (1) TABLET EACH DAY   traMADol (ULTRAM) 50 MG tablet Take 1 tablet (50 mg total) by mouth 4 (four) times daily.   Vitamin D, Ergocalciferol, (DRISDOL) 1.25 MG (50000 UT) CAPS capsule Take 1 capsule (50,000 Units total) by mouth every 7 (seven) days.     Allergies:   Iodinated diagnostic agents and Latex   Social History   Tobacco Use   Smoking status: Current Every Day Smoker    Packs/day: 0.25    Years: 42.00    Pack years: 10.50    Types: Cigarettes   Smokeless tobacco: Never Used   Tobacco comment: 1 pack last 4 days     Substance Use Topics   Alcohol use: No   Drug use: No     Family Hx: The patient's family history includes Alcohol abuse in her brother; Congestive Heart Failure in her father; Diabetes in her mother; Heart disease in her brother; Heart failure in her father; Kidney disease in her mother. There is no history of Anesthesia problems, Hypotension, Malignant hyperthermia, or Pseudochol deficiency.  ROS:   Please see the history of present illness.     All other systems reviewed and are negative.   Prior CV studies:   The following studies were reviewed today:  Echocardiogram 01/10/2017:  Study Conclusions  - Left ventricle: The cavity size was normal. There was mild   concentric hypertrophy. Systolic function was normal. The   estimated ejection fraction was in the range of 55% to 60%. Wall   motion was normal; there were no regional wall motion   abnormalities. Doppler parameters are consistent with abnormal   left ventricular relaxation (grade 1 diastolic dysfunction). - Aortic valve:  Transvalvular velocity was within the normal range.   There was no stenosis. There was no regurgitation. - Mitral valve: Transvalvular velocity was within the normal range.   There was no evidence for stenosis. There was trivial   regurgitation. Valve area by pressure half-time: 2.32 cm^2. - Right ventricle: The cavity size was normal. Wall thickness was   normal. Systolic function was normal. - Atrial septum: No defect or patent foramen ovale was identified   by color flow Doppler. - Tricuspid valve: There was mild regurgitation. - Pulmonary arteries: Systolic pressure was within the normal   range. PA peak pressure: 39 mm Hg (S).  Labs/Other Tests and Data Reviewed:    EKG:  No ECG reviewed.  Recent Labs: 04/06/2019: ALT 23; BUN 23; Creatinine, Ser 0.88; Hemoglobin 13.3; Platelets 235; Potassium 4.6; Sodium 139   Recent Lipid Panel Lab Results  Component Value Date/Time   CHOL 155  09/14/2018 01:06 PM   CHOL 158 10/02/2012 01:01 PM   TRIG 259 (H) 09/14/2018 01:06 PM   TRIG 229 (H) 11/24/2014 11:26 AM   TRIG 126 10/02/2012 01:01 PM   HDL 42 09/14/2018 01:06 PM   HDL 42 11/24/2014 11:26 AM   HDL 41 10/02/2012 01:01 PM   CHOLHDL 3.7 09/14/2018 01:06 PM   CHOLHDL 7.2 01/10/2017 04:05 AM   LDLCALC 61 09/14/2018 01:06 PM   LDLCALC 100 (H) 11/29/2013 12:23 PM   LDLCALC 92 10/02/2012 01:01 PM    Wt Readings from Last 3 Encounters:  06/03/19 195 lb (88.5 kg)  04/06/19 195 lb 9.6 oz (88.7 kg)  09/14/18 199 lb 3.2 oz (90.4 kg)     Objective:    Vital Signs:  Ht 5\' 10"  (1.778 m)    Wt 195 lb (88.5 kg)    BMI 27.98 kg/m    VITAL SIGNS:  reviewed  ASSESSMENT & PLAN:    1. Arrhythmia: Symptomatically stable.Most recent device interrogation from November 2020 reviewed above. There has been no evidence ofatrial fibrillation thus far. Plan per EP is to continue long-term monitoring.    2. Hypertension:  No changes.  3. Left MCA CVA: See discussion in #1.  Continue aspirin and rosuvastatin.  4.Dyslipidemia: Continue rosuvastatin.     COVID-19 Education: The signs and symptoms of COVID-19 were discussed with the patient and how to seek care for testing (follow up with PCP or arrange E-visit).  The importance of social distancing was discussed today.  Time:   Today, I have spent 5 minutes with the patient with telehealth technology discussing the above problems.     Medication Adjustments/Labs and Tests Ordered: Current medicines are reviewed at length with the patient today.  Concerns regarding medicines are outlined above.   Tests Ordered: No orders of the defined types were placed in this encounter.   Medication Changes: No orders of the defined types were placed in this encounter.   Follow Up:  Virtual Visit  prn  Signed, Kate Sable, MD  06/03/2019 10:09 AM    [place]

## 2019-06-12 ENCOUNTER — Other Ambulatory Visit: Payer: Self-pay | Admitting: Family

## 2019-06-12 DIAGNOSIS — Z0289 Encounter for other administrative examinations: Secondary | ICD-10-CM

## 2019-06-12 DIAGNOSIS — M1612 Unilateral primary osteoarthritis, left hip: Secondary | ICD-10-CM

## 2019-06-22 ENCOUNTER — Other Ambulatory Visit: Payer: Self-pay | Admitting: Family

## 2019-06-22 DIAGNOSIS — I10 Essential (primary) hypertension: Secondary | ICD-10-CM

## 2019-06-23 NOTE — Progress Notes (Signed)
ILR remote 

## 2019-07-01 ENCOUNTER — Ambulatory Visit (INDEPENDENT_AMBULATORY_CARE_PROVIDER_SITE_OTHER): Payer: Medicare HMO | Admitting: *Deleted

## 2019-07-01 DIAGNOSIS — I63439 Cerebral infarction due to embolism of unspecified posterior cerebral artery: Secondary | ICD-10-CM | POA: Diagnosis not present

## 2019-07-01 LAB — CUP PACEART REMOTE DEVICE CHECK
Date Time Interrogation Session: 20201230231854
Implantable Pulse Generator Implant Date: 20181002

## 2019-07-02 NOTE — Progress Notes (Signed)
ILR remote 

## 2019-07-05 ENCOUNTER — Other Ambulatory Visit: Payer: Self-pay | Admitting: Family

## 2019-07-07 ENCOUNTER — Other Ambulatory Visit: Payer: Self-pay

## 2019-07-08 ENCOUNTER — Other Ambulatory Visit: Payer: Medicare HMO

## 2019-07-08 ENCOUNTER — Ambulatory Visit: Payer: Medicare HMO | Admitting: Family

## 2019-07-12 ENCOUNTER — Other Ambulatory Visit: Payer: Self-pay | Admitting: Family

## 2019-07-13 ENCOUNTER — Encounter: Payer: Self-pay | Admitting: Nurse Practitioner

## 2019-07-13 ENCOUNTER — Ambulatory Visit (INDEPENDENT_AMBULATORY_CARE_PROVIDER_SITE_OTHER): Payer: Medicare HMO | Admitting: Nurse Practitioner

## 2019-07-13 DIAGNOSIS — R410 Disorientation, unspecified: Secondary | ICD-10-CM

## 2019-07-13 DIAGNOSIS — R300 Dysuria: Secondary | ICD-10-CM

## 2019-07-13 LAB — URINALYSIS, COMPLETE
Bilirubin, UA: NEGATIVE
Glucose, UA: NEGATIVE
Ketones, UA: NEGATIVE
Nitrite, UA: NEGATIVE
Protein,UA: NEGATIVE
RBC, UA: NEGATIVE
Specific Gravity, UA: 1.02 (ref 1.005–1.030)
Urobilinogen, Ur: 0.2 mg/dL (ref 0.2–1.0)
pH, UA: 6.5 (ref 5.0–7.5)

## 2019-07-13 LAB — MICROSCOPIC EXAMINATION
Epithelial Cells (non renal): >10 /hpf — AB (ref 0–10)
Renal Epithel, UA: NONE SEEN /hpf

## 2019-07-13 MED ORDER — CEPHALEXIN 500 MG PO CAPS: 500.0000 mg | ORAL_CAPSULE | Freq: Two times a day (BID) | ORAL | 0 refills | Status: DC

## 2019-07-13 NOTE — Progress Notes (Signed)
   Virtual Visit via telephone Note Due to COVID-19 pandemic this visit was conducted virtually. This visit type was conducted due to national recommendations for restrictions regarding the COVID-19 Pandemic (e.g. social distancing, sheltering in place) in an effort to limit this patient's exposure and mitigate transmission in our community. All issues noted in this document were discussed and addressed.  A physical exam was not performed with this format.  I connected with Caitlin Mccarthy on 07/13/19 at 9:50 by telephone and verified that I am speaking with the correct person using two identifiers. Caitlin Mccarthy is currently located at home and her daughter is currently with her during visit. The provider, Mary-Margaret Hassell Done, FNP is located in their office at time of visit.  I discussed the limitations, risks, security and privacy concerns of performing an evaluation and management service by telephone and the availability of in person appointments. I also discussed with the patient that there may be a patient responsible charge related to this service. The patient expressed understanding and agreed to proceed.   History and Present Illness:   Chief Complaint: Urinary Tract Infection   HPI Patient calls in today c/o dysuria and frequency that started several days ago. She was a little disoriented yesterday evening. Has mild lower back pain with some nausea. Her daughter says she is going to the bathroom more frequently    Review of Systems  Genitourinary: Positive for dysuria, frequency and urgency.  Psychiatric/Behavioral: Positive for hallucinations.     Observations/Objective: Alert and oriented- answers all questions appropriately No distress    Assessment and Plan: Caitlin Mccarthy in today with chief complaint of Urinary Tract Infection   1. Dysuria Take medication as prescribe Cotton underwear Take shower not bath Cranberry juice, yogurt Force fluids AZO  over the counter X2 days Culture pending- family will bring in urine specimen RTO prn  - cephALEXin (KEFLEX) 500 MG capsule; Take 1 capsule (500 mg total) by mouth 2 (two) times daily.  Dispense: 14 capsule; Refill: 0 - Urine culture; Future - urinalysis- dip and micro; Future  2. Disorientation  - cephALEXin (KEFLEX) 500 MG capsule; Take 1 capsule (500 mg total) by mouth 2 (two) times daily.  Dispense: 14 capsule; Refill: 0   Follow Up Instructions: prn    I discussed the assessment and treatment plan with the patient. The patient was provided an opportunity to ask questions and all were answered. The patient agreed with the plan and demonstrated an understanding of the instructions.   The patient was advised to call back or seek an in-person evaluation if the symptoms worsen or if the condition fails to improve as anticipated.  The above assessment and management plan was discussed with the patient. The patient verbalized understanding of and has agreed to the management plan. Patient is aware to call the clinic if symptoms persist or worsen. Patient is aware when to return to the clinic for a follow-up visit. Patient educated on when it is appropriate to go to the emergency department.   Time call ended:  10:02  I provided 12 minutes of non-face-to-face time during this encounter.    Mary-Margaret Hassell Done, FNP

## 2019-07-13 NOTE — Addendum Note (Signed)
Addended by: Pollyann Kennedy F on: 07/13/2019 11:35 AM   Modules accepted: Orders

## 2019-07-14 LAB — URINE CULTURE

## 2019-07-23 ENCOUNTER — Ambulatory Visit (INDEPENDENT_AMBULATORY_CARE_PROVIDER_SITE_OTHER): Payer: Medicare HMO | Admitting: Family

## 2019-07-23 ENCOUNTER — Other Ambulatory Visit: Payer: Medicare HMO

## 2019-07-23 ENCOUNTER — Encounter: Payer: Self-pay | Admitting: Family

## 2019-07-23 ENCOUNTER — Other Ambulatory Visit: Payer: Self-pay

## 2019-07-23 VITALS — BP 133/77 | HR 70 | Temp 97.1°F | Ht 70.0 in | Wt 192.0 lb

## 2019-07-23 DIAGNOSIS — I6930 Unspecified sequelae of cerebral infarction: Secondary | ICD-10-CM

## 2019-07-23 DIAGNOSIS — I10 Essential (primary) hypertension: Secondary | ICD-10-CM | POA: Diagnosis not present

## 2019-07-23 DIAGNOSIS — M8949 Other hypertrophic osteoarthropathy, multiple sites: Secondary | ICD-10-CM | POA: Diagnosis not present

## 2019-07-23 DIAGNOSIS — M1612 Unilateral primary osteoarthritis, left hip: Secondary | ICD-10-CM

## 2019-07-23 DIAGNOSIS — N3281 Overactive bladder: Secondary | ICD-10-CM

## 2019-07-23 DIAGNOSIS — F411 Generalized anxiety disorder: Secondary | ICD-10-CM | POA: Diagnosis not present

## 2019-07-23 DIAGNOSIS — F339 Major depressive disorder, recurrent, unspecified: Secondary | ICD-10-CM

## 2019-07-23 DIAGNOSIS — E781 Pure hyperglyceridemia: Secondary | ICD-10-CM

## 2019-07-23 DIAGNOSIS — G8929 Other chronic pain: Secondary | ICD-10-CM | POA: Diagnosis not present

## 2019-07-23 DIAGNOSIS — M159 Polyosteoarthritis, unspecified: Secondary | ICD-10-CM

## 2019-07-23 DIAGNOSIS — K219 Gastro-esophageal reflux disease without esophagitis: Secondary | ICD-10-CM

## 2019-07-23 DIAGNOSIS — Z0289 Encounter for other administrative examinations: Secondary | ICD-10-CM | POA: Diagnosis not present

## 2019-07-23 DIAGNOSIS — E1165 Type 2 diabetes mellitus with hyperglycemia: Secondary | ICD-10-CM

## 2019-07-23 DIAGNOSIS — E663 Overweight: Secondary | ICD-10-CM

## 2019-07-23 DIAGNOSIS — F17200 Nicotine dependence, unspecified, uncomplicated: Secondary | ICD-10-CM

## 2019-07-23 LAB — BAYER DCA HB A1C WAIVED: HB A1C (BAYER DCA - WAIVED): 6.6 % (ref <7.0)

## 2019-07-23 MED ORDER — OMEPRAZOLE 20 MG PO CPDR: 20.0000 mg | DELAYED_RELEASE_CAPSULE | Freq: Two times a day (BID) | ORAL | 3 refills | Status: DC

## 2019-07-23 MED ORDER — BACLOFEN 5 MG PO TABS: 5.0000 mg | ORAL_TABLET | Freq: Three times a day (TID) | ORAL | 1 refills | Status: DC | PRN

## 2019-07-23 MED ORDER — TRAMADOL HCL 50 MG PO TABS: ORAL_TABLET | ORAL | 2 refills | Status: DC

## 2019-07-23 MED ORDER — MIRABEGRON ER 25 MG PO TB24: 25.0000 mg | ORAL_TABLET | Freq: Every day | ORAL | 2 refills | Status: DC

## 2019-07-23 NOTE — Progress Notes (Signed)
Subjective:    Patient ID: Caitlin Mccarthy, female    DOB: 1935-12-24, 84 y.o.   MRN: [id number]  Chief Complaint  Patient presents with  . Medical Management of Chronic Issues   PT calls the office chronic follow up. She is followed by Urologists annually for frequent UTI's.PT states she had a CVA 2018. She is followed by Cardiologists annually.  Diabetes She presents for her follow-up diabetic visit. She has type 2 diabetes mellitus. Her disease course has been stable. Hypoglycemia symptoms include nervousness/anxiousness. Associated symptoms include foot paresthesias. Pertinent negatives for diabetes include no blurred vision. Symptoms are stable. Diabetic complications include a CVA. Risk factors for coronary artery disease include diabetes mellitus, dyslipidemia, hypertension, sedentary lifestyle and post-menopausal. She is following a generally healthy diet. Her overall blood glucose range is 110-130 mg/dl.  Hypertension This is a chronic problem. The current episode started more than 1 year ago. The problem has been resolved since onset. The problem is controlled. Associated symptoms include malaise/fatigue and peripheral edema ("slight"). Pertinent negatives include no anxiety, blurred vision or shortness of breath. Risk factors for coronary artery disease include dyslipidemia, diabetes mellitus, obesity and sedentary lifestyle. The current treatment provides moderate improvement. Hypertensive end-organ damage includes CVA.  Gastroesophageal Reflux She complains of belching and heartburn. This is a chronic problem. The current episode started more than 1 year ago. The problem occurs frequently. The problem has been waxing and waning. Risk factors include obesity. She has tried a PPI for the symptoms. The treatment provided moderate relief.  Arthritis Presents for follow-up visit. She complains of pain. The symptoms have been stable. Affected locations include the right MCP, left MCP,  right knee and left knee (back). Her pain is at a severity of 9/10.  Hyperlipidemia This is a chronic problem. The current episode started more than 1 year ago. The problem is uncontrolled. Exacerbating diseases include obesity. Pertinent negatives include no shortness of breath. Current antihyperlipidemic treatment includes statins. The current treatment provides moderate improvement of lipids. Risk factors for coronary artery disease include dyslipidemia, diabetes mellitus, hypertension and a sedentary lifestyle.  Anxiety Presents for follow-up visit. Symptoms include depressed mood, excessive worry, irritability, nervous/anxious behavior and restlessness. Patient reports no shortness of breath. Symptoms occur occasionally. The severity of symptoms is moderate. The quality of sleep is good.    Nicotine Dependence Presents for follow-up visit. Symptoms include irritability. Her urge triggers include company of smokers. The symptoms have been stable. She smokes < 1/2 a pack of cigarettes per day. Compliance with prior treatments has been good.  Urinary Frequency  This is a chronic problem. The current episode started more than 1 year ago. The problem occurs every urination. The problem has been waxing and waning. Associated symptoms include frequency.      Review of Systems  Constitutional: Positive for irritability and malaise/fatigue.  Eyes: Negative for blurred vision.  Respiratory: Negative for shortness of breath.   Gastrointestinal: Positive for heartburn.  Genitourinary: Positive for frequency.  Musculoskeletal: Positive for arthritis.  Psychiatric/Behavioral: The patient is nervous/anxious.   All other systems reviewed and are negative.      Objective:   Physical Exam Vitals reviewed.  Constitutional:      General: She is not in acute distress.    Appearance: She is well-developed.  HENT:     Head: Normocephalic and atraumatic.     Right Ear: Tympanic membrane normal.      Left Ear: Tympanic membrane normal.  Eyes:  Pupils: Pupils are equal, round, and reactive to light.  Neck:     Thyroid: No thyromegaly.  Cardiovascular:     Rate and Rhythm: Normal rate and regular rhythm.     Heart sounds: Normal heart sounds. No murmur.  Pulmonary:     Effort: Pulmonary effort is normal. No respiratory distress.     Breath sounds: Normal breath sounds. No wheezing.  Abdominal:     General: Bowel sounds are normal. There is no distension.     Palpations: Abdomen is soft.     Tenderness: There is no abdominal tenderness.  Musculoskeletal:        General: No tenderness. Normal range of motion.     Cervical back: Normal range of motion and neck supple.  Skin:    General: Skin is warm and dry.  Neurological:     Mental Status: She is alert and oriented to person, place, and time.     Cranial Nerves: No cranial nerve deficit.     Deep Tendon Reflexes: Reflexes are normal and symmetric.  Psychiatric:        Behavior: Behavior normal.        Thought Content: Thought content normal.        Judgment: Judgment normal.       BP 133/77   Pulse 70   Temp (!) 97.1 F (36.2 C) (Temporal)   Ht _0  (1.778 m)   Wt 192 lb (87.1 kg)   BMI 27.55 kg/m      Assessment & Plan:  TOBEY LIPPARD comes in today with chief complaint of Medical Management of Chronic Issues   Diagnosis and orders addressed:  1. Type 2 diabetes mellitus with hyperglycemia, without long-term current use of insulin (HCC) - Bayer DCA Hb A1c Waived - CMP14+EGFR - CBC with Differential/Platelet  2. Essential hypertension, benign - CMP14+EGFR - CBC with Differential/Platelet  3. Gastroesophageal reflux disease without esophagitis Will increase Prilosec 20 mg BID -Diet discussed- Avoid fried, spicy, citrus foods, caffeine and alcohol -Do not eat 2-3 hours before bedtime -Encouraged small frequent meals -Avoid NSAID's - CMP14+EGFR - CBC with Differential/Platelet - omeprazole  (PRILOSEC) 20 MG capsule; Take 1 capsule (20 mg total) by mouth 2 (two) times daily before a meal.  Dispense: 180 capsule; Refill: 3  4. Primary osteoarthritis involving multiple joints - CMP14+EGFR - CBC with Differential/Platelet  5. Episode of recurrent major depressive disorder, unspecified depression episode severity ([place]) - CMP14+EGFR - CBC with Differential/Platelet  6. Other chronic pain - CMP14+EGFR - CBC with Differential/Platelet  7. Overweight (BMI 25.0-29.9) - CMP14+EGFR - CBC with Differential/Platelet  8. GAD (generalized anxiety disorder) - CMP14+EGFR - CBC with Differential/Platelet  9. Current smoker - CMP14+EGFR - CBC with Differential/Platelet  10. Pure hypertriglyceridemia - CMP14+EGFR - CBC with Differential/Platelet - Lipid panel  11. Late effect of cerebrovascular accident (CVA) - CMP14+EGFR - CBC with Differential/Platelet - ToxASSURE Select 13 (MW), Urine  12. Pain management contract signed - traMADol (ULTRAM) 50 MG tablet; TAKE ONE TABLET FOUR TIMES DAILY  Dispense: 60 tablet; Refill: 2 - CMP14+EGFR - CBC with Differential/Platelet - ToxASSURE Select 13 (MW), Urine  13. Primary osteoarthritis of left hip - traMADol (ULTRAM) 50 MG tablet; TAKE ONE TABLET FOUR TIMES DAILY  Dispense: 60 tablet; Refill: 2 - CMP14+EGFR - CBC with Differential/Platelet  14. Overactive bladder Will start Myrbetriq Avoid sodas and caffeine  - mirabegron ER (MYRBETRIQ) 25 MG TB24 tablet; Take 1 tablet (25 mg total) by mouth daily.  Dispense:  90 tablet; Refill: 2   Labs pending Controlled contract updated and drug screen up dated  Health Maintenance reviewed Diet and exercise encouraged  Follow up plan: 3 months   Evelina Dun, FNP

## 2019-07-23 NOTE — Patient Instructions (Signed)

## 2019-07-24 LAB — CMP14+EGFR
ALT: 14 IU/L (ref 0–32)
AST: 25 IU/L (ref 0–40)
Albumin/Globulin Ratio: 1.7 (ref 1.2–2.2)
Albumin: 4.5 g/dL (ref 3.6–4.6)
Alkaline Phosphatase: 55 IU/L (ref 39–117)
BUN/Creatinine Ratio: 17 (ref 12–28)
BUN: 22 mg/dL (ref 8–27)
Bilirubin Total: 0.3 mg/dL (ref 0.0–1.2)
CO2: 23 mmol/L (ref 20–29)
Calcium: 11.9 mg/dL — ABNORMAL HIGH (ref 8.7–10.3)
Chloride: 100 mmol/L (ref 96–106)
Creatinine, Ser: 1.29 mg/dL — ABNORMAL HIGH (ref 0.57–1.00)
GFR calc Af Amer: 45 mL/min/{1.73_m2} — ABNORMAL LOW (ref >59)
GFR calc non Af Amer: 39 mL/min/{1.73_m2} — ABNORMAL LOW (ref >59)
Globulin, Total: 2.7 g/dL (ref 1.5–4.5)
Glucose: 111 mg/dL — ABNORMAL HIGH (ref 65–99)
Potassium: 4.5 mmol/L (ref 3.5–5.2)
Sodium: 138 mmol/L (ref 134–144)
Total Protein: 7.2 g/dL (ref 6.0–8.5)

## 2019-07-24 LAB — LIPID PANEL
Chol/HDL Ratio: 4.2 ratio (ref 0.0–4.4)
Cholesterol, Total: 162 mg/dL (ref 100–199)
HDL: 39 mg/dL — ABNORMAL LOW (ref >39)
LDL Chol Calc (NIH): 86 mg/dL (ref 0–99)
Triglycerides: 220 mg/dL — ABNORMAL HIGH (ref 0–149)
VLDL Cholesterol Cal: 37 mg/dL (ref 5–40)

## 2019-07-24 LAB — CBC WITH DIFFERENTIAL/PLATELET
Basophils Absolute: 0.1 10*3/uL (ref 0.0–0.2)
Basos: 1 %
EOS (ABSOLUTE): 0.6 10*3/uL — ABNORMAL HIGH (ref 0.0–0.4)
Eos: 9 %
Hematocrit: 39.7 % (ref 34.0–46.6)
Hemoglobin: 13.1 g/dL (ref 11.1–15.9)
Immature Grans (Abs): 0 10*3/uL (ref 0.0–0.1)
Immature Granulocytes: 0 %
Lymphocytes Absolute: 2.8 10*3/uL (ref 0.7–3.1)
Lymphs: 42 %
MCH: 31 pg (ref 26.6–33.0)
MCHC: 33 g/dL (ref 31.5–35.7)
MCV: 94 fL (ref 79–97)
Monocytes Absolute: 0.6 10*3/uL (ref 0.1–0.9)
Monocytes: 9 %
Neutrophils Absolute: 2.6 10*3/uL (ref 1.4–7.0)
Neutrophils: 39 %
Platelets: 228 10*3/uL (ref 150–450)
RBC: 4.22 x10E6/uL (ref 3.77–5.28)
RDW: 12.2 % (ref 11.7–15.4)
WBC: 6.7 10*3/uL (ref 3.4–10.8)

## 2019-07-27 ENCOUNTER — Other Ambulatory Visit: Payer: Self-pay | Admitting: Family

## 2019-07-27 DIAGNOSIS — Z23 Encounter for immunization: Secondary | ICD-10-CM | POA: Diagnosis not present

## 2019-07-27 LAB — TOXASSURE SELECT 13 (MW), URINE

## 2019-08-02 ENCOUNTER — Ambulatory Visit (INDEPENDENT_AMBULATORY_CARE_PROVIDER_SITE_OTHER): Payer: Medicare HMO | Admitting: *Deleted

## 2019-08-02 DIAGNOSIS — I63439 Cerebral infarction due to embolism of unspecified posterior cerebral artery: Secondary | ICD-10-CM | POA: Diagnosis not present

## 2019-08-02 LAB — CUP PACEART REMOTE DEVICE CHECK
Date Time Interrogation Session: 20210201002746
Implantable Pulse Generator Implant Date: 20181002

## 2019-08-02 NOTE — Progress Notes (Signed)
ILR Remote 

## 2019-08-16 ENCOUNTER — Other Ambulatory Visit: Payer: Self-pay | Admitting: Cardiovascular Disease

## 2019-08-16 DIAGNOSIS — E785 Hyperlipidemia, unspecified: Secondary | ICD-10-CM

## 2019-08-24 DIAGNOSIS — Z23 Encounter for immunization: Secondary | ICD-10-CM | POA: Diagnosis not present

## 2019-09-02 ENCOUNTER — Ambulatory Visit (INDEPENDENT_AMBULATORY_CARE_PROVIDER_SITE_OTHER): Payer: Medicare HMO | Admitting: *Deleted

## 2019-09-02 DIAGNOSIS — I63439 Cerebral infarction due to embolism of unspecified posterior cerebral artery: Secondary | ICD-10-CM

## 2019-09-02 LAB — CUP PACEART REMOTE DEVICE CHECK
Date Time Interrogation Session: 20210304030912
Implantable Pulse Generator Implant Date: 20181002

## 2019-09-02 NOTE — Progress Notes (Signed)
ILR Remote 

## 2019-09-21 ENCOUNTER — Ambulatory Visit (INDEPENDENT_AMBULATORY_CARE_PROVIDER_SITE_OTHER): Payer: Medicare HMO

## 2019-09-21 ENCOUNTER — Ambulatory Visit (INDEPENDENT_AMBULATORY_CARE_PROVIDER_SITE_OTHER): Payer: Medicare HMO | Admitting: Family Medicine

## 2019-09-21 ENCOUNTER — Other Ambulatory Visit: Payer: Self-pay

## 2019-09-21 ENCOUNTER — Encounter: Payer: Self-pay | Admitting: Family Medicine

## 2019-09-21 VITALS — BP 136/76 | HR 79 | Temp 96.8°F | Ht 70.0 in | Wt 199.8 lb

## 2019-09-21 DIAGNOSIS — W19XXXA Unspecified fall, initial encounter: Secondary | ICD-10-CM

## 2019-09-21 DIAGNOSIS — R0782 Intercostal pain: Secondary | ICD-10-CM

## 2019-09-21 DIAGNOSIS — R410 Disorientation, unspecified: Secondary | ICD-10-CM | POA: Diagnosis not present

## 2019-09-21 DIAGNOSIS — R0781 Pleurodynia: Secondary | ICD-10-CM

## 2019-09-21 DIAGNOSIS — S299XXA Unspecified injury of thorax, initial encounter: Secondary | ICD-10-CM | POA: Diagnosis not present

## 2019-09-21 LAB — URINALYSIS, COMPLETE
Bilirubin, UA: NEGATIVE
Ketones, UA: NEGATIVE
Nitrite, UA: NEGATIVE
Protein,UA: NEGATIVE
Specific Gravity, UA: 1.025 (ref 1.005–1.030)
Urobilinogen, Ur: 0.2 mg/dL (ref 0.2–1.0)
pH, UA: 5.5 (ref 5.0–7.5)

## 2019-09-21 LAB — MICROSCOPIC EXAMINATION: Renal Epithel, UA: NONE SEEN /hpf

## 2019-09-21 NOTE — Progress Notes (Signed)
Subjective:  Patient ID: Caitlin Mccarthy, female    DOB: 07/03/1935  Age: 84 y.o. MRN: [id number]  CC: Fall and Chest Pain   HPI VICKI MELDRUM presents for having fallen on 3/13.  She has been sore in her anterior chest just to the right of midline and radiating to the left.  It has not made her short of breath.  There has been no bruising or bleeding.  She is concerned about rib fracture.  Her daughter states that she was confused yesterday. Better today. Has had UTI cause this in the past.  Wants a urine specimen checked to make sure that is not going to happen again.  Depression screen [facility] 2/9 09/21/2019 07/23/2019 04/06/2019  Decreased Interest 0 0 0  Down, Depressed, Hopeless 0 1 0  PHQ - 2 Score 0 1 0  Altered sleeping - - -  Tired, decreased energy - - -  Change in appetite - - -  Feeling bad or failure about yourself  - - -  Trouble concentrating - - -  Moving slowly or fidgety/restless - - -  Suicidal thoughts - - -  PHQ-9 Score - - -  Difficult doing work/chores - - -  Some recent data might be hidden    History [name] has a past medical history of Anxiety, Depression, GERD (gastroesophageal reflux disease), History of acute pyelonephritis, History of kidney stones, History of recurrent UTIs, Hyperlipidemia, Hypertension, OA (osteoarthritis), Osteoporosis, Poor memory, Renal calculus, left, Stroke ([place]), Type 2 diabetes mellitus ([place]), and Vitamin D deficiency (05/10/2016).   She has a past surgical history that includes Knee arthroscopy (Bilateral, right 2005//  left ?); Cataract extraction w/PHACO (06/06/2011); Cataract extraction w/PHACO (06/27/2011); Total hip arthroplasty (10/28/2011); Total knee arthroplasty (Bilateral, left 03-09-2007//  right 2006); Tubal ligation; Extracorporeal shock wave lithotripsy (left 04-01-2016;  1980's); Cystoscopy with ureteroscopy and stent placement (Right, 04/27/2015); Holmium laser application (Right, [id number]); transthoracic  echocardiogram (10/06/2006); Cystoscopy/ureteroscopy/holmium laser/stent placement (Left, 12/24/2016); Fracture surgery; LOOP RECORDER INSERTION (N/A, 04/01/2017); Knee Arthroplasty; and Hip Arthroplasty.   Her family history includes Alcohol abuse in her brother; Congestive Heart Failure in her father; Diabetes in her mother; Heart disease in her brother; Heart failure in her father; Kidney disease in her mother.She reports that she has been smoking cigarettes. She has a 10.50 pack-year smoking history. She has never used smokeless tobacco. She reports that she does not drink alcohol or use drugs.    ROS Review of Systems  Constitutional: Negative.   HENT: Negative.   Eyes: Negative for visual disturbance.  Respiratory: Negative for shortness of breath.   Cardiovascular: Positive for chest pain (noncardiac).  Gastrointestinal: Negative for abdominal pain.  Musculoskeletal: Negative for arthralgias.    Objective:  BP 136/76   Pulse 79   Temp (!) 96.8 F (36 C) (Temporal)   Ht 5\' 10"  (1.778 m)   Wt 199 lb 12.8 oz (90.6 kg)   BMI 28.67 kg/m   BP Readings from Last 3 Encounters:  09/21/19 136/76  07/23/19 133/77  04/06/19 129/71    Wt Readings from Last 3 Encounters:  09/21/19 199 lb 12.8 oz (90.6 kg)  07/23/19 192 lb (87.1 kg)  06/03/19 195 lb (88.5 kg)     Physical Exam Constitutional:      General: She is not in acute distress.    Appearance: She is well-developed.  Cardiovascular:     Rate and Rhythm: Normal rate and regular rhythm.  Pulmonary:     Breath sounds:  Normal breath sounds.  Abdominal:     Tenderness: There is guarding.  Musculoskeletal:     Left lower leg: Tenderness (anterior chest wall just right of the midline.) present.  Skin:    General: Skin is warm and dry.  Neurological:     Mental Status: She is alert and oriented to person, place, and time.   X-ray of ribs bilaterally shows no apparent fracture.    Assessment & Plan:   Jamie was  seen today for fall and chest pain.  Diagnoses and all orders for this visit:  Fall, initial encounter -     DG Chest 2 View; Future -     DG Ribs Unilateral Right; Future -     DG Ribs Unilateral Left; Future  Rib pain -     DG Chest 2 View; Future -     DG Ribs Unilateral Right; Future -     DG Ribs Unilateral Left; Future  Intercostal pain -     DG Chest 2 View; Future -     DG Ribs Unilateral Right; Future -     DG Ribs Unilateral Left; Future  Confusion -     Urinalysis, Complete -     Urine Culture  Other orders -     Microscopic Examination       I am having Caitlin Mccarthy maintain her Premarin, aspirin EC, Vitamin D (Ergocalciferol), DULoxetine, fenofibrate, fluticasone, ondansetron, chlorthalidone, gabapentin, amLODipine-benazepril, metFORMIN, nystatin, traMADol, Baclofen, mirabegron ER, omeprazole, and rosuvastatin.  Allergies as of 09/21/2019      Reactions   Iodinated Diagnostic Agents    Latex       Medication List       Accurate as of September 21, 2019 11:59 PM. If you have any questions, ask your nurse or doctor.        amLODipine-benazepril 10-40 MG capsule Commonly known as: LOTREL TAKE ONE CAPSULE EACH MORNING   aspirin EC 81 MG tablet Take 1 tablet (81 mg total) by mouth daily.   Baclofen 5 MG Tabs Take 5 mg by mouth 3 (three) times daily as needed.   chlorthalidone 25 MG tablet Commonly known as: HYGROTON TAKE 1/2 TABLET DAILY   DULoxetine 60 MG capsule Commonly known as: Cymbalta Take 1 capsule (60 mg total) by mouth daily.   fenofibrate 145 MG tablet Commonly known as: TRICOR TAKE ONE (1) TABLET EACH DAY   fluticasone 50 MCG/ACT nasal spray Commonly known as: FLONASE Place 2 sprays into both nostrils daily.   gabapentin 100 MG capsule Commonly known as: NEURONTIN Take 100 mg by mouth daily.   metFORMIN 500 MG 24 hr tablet Commonly known as: GLUCOPHAGE-XR TAKE 2 TABLETS EVERY MORNING WITH BREAKFAST   mirabegron ER 25  MG Tb24 tablet Commonly known as: Myrbetriq Take 1 tablet (25 mg total) by mouth daily.   nystatin powder Generic drug: nystatin APPLY 4 TIMES DAILY   omeprazole 20 MG capsule Commonly known as: PRILOSEC Take 1 capsule (20 mg total) by mouth 2 (two) times daily before a meal.   ondansetron 4 MG disintegrating tablet Commonly known as: Zofran ODT Take 1 tablet (4 mg total) by mouth every 8 (eight) hours as needed for nausea or vomiting.   Premarin vaginal cream Generic drug: conjugated estrogens Apply 1 application topically daily as needed (irritation).   rosuvastatin 40 MG tablet Commonly known as: CRESTOR TAKE ONE (1) TABLET EACH DAY   traMADol 50 MG tablet Commonly known as: ULTRAM TAKE ONE TABLET  FOUR TIMES DAILY   Vitamin D (Ergocalciferol) 1.25 MG (50000 UNIT) Caps capsule Commonly known as: DRISDOL Take 1 capsule (50,000 Units total) by mouth every 7 (seven) days.      Patient has contused her chest wall and does not appear to have suffered any serious injury.  Her confusion from yesterday has cleared and she is lucid and cognizant during today's evaluation.  I will base the possibility of UTI on the results of her urine culture  Follow-up: Return if symptoms worsen or fail to improve.  Claretta Fraise, M.D.

## 2019-09-22 ENCOUNTER — Encounter: Payer: Self-pay | Admitting: Family Medicine

## 2019-09-23 LAB — URINE CULTURE: Organism ID, Bacteria: NO GROWTH

## 2019-09-24 NOTE — Progress Notes (Signed)
Hello Pam[place]e,  Your lab result is normal and/or stable.Some minor variations that are not significant are commonly marked abnormal, but do not represent any medical problem for you.  Best regards, [place]ana Baldwin, M.D.

## 2019-09-28 ENCOUNTER — Other Ambulatory Visit: Payer: Self-pay

## 2019-09-28 DIAGNOSIS — R110 Nausea: Secondary | ICD-10-CM

## 2019-09-28 NOTE — Telephone Encounter (Signed)
Last office visit 07/23/2019 Last refill 02/17/2019, #20, no refills

## 2019-09-29 ENCOUNTER — Other Ambulatory Visit: Payer: Self-pay | Admitting: Family

## 2019-09-30 DIAGNOSIS — N3020 Other chronic cystitis without hematuria: Secondary | ICD-10-CM | POA: Diagnosis not present

## 2019-09-30 MED ORDER — ONDANSETRON 4 MG PO TBDP: 4.0000 mg | ORAL_TABLET | Freq: Three times a day (TID) | ORAL | 0 refills | Status: DC | PRN

## 2019-10-04 ENCOUNTER — Ambulatory Visit (INDEPENDENT_AMBULATORY_CARE_PROVIDER_SITE_OTHER): Payer: Medicare HMO | Admitting: *Deleted

## 2019-10-04 DIAGNOSIS — I63439 Cerebral infarction due to embolism of unspecified posterior cerebral artery: Secondary | ICD-10-CM

## 2019-10-04 LAB — CUP PACEART REMOTE DEVICE CHECK
Date Time Interrogation Session: 20210404041224
Implantable Pulse Generator Implant Date: 20181002

## 2019-10-05 NOTE — Progress Notes (Signed)
ILR Remote 

## 2019-10-09 ENCOUNTER — Other Ambulatory Visit: Payer: Self-pay | Admitting: Family

## 2019-10-20 DIAGNOSIS — N3020 Other chronic cystitis without hematuria: Secondary | ICD-10-CM | POA: Diagnosis not present

## 2019-10-25 ENCOUNTER — Encounter: Payer: Self-pay | Admitting: Nurse Practitioner

## 2019-10-25 ENCOUNTER — Ambulatory Visit: Payer: Medicare HMO

## 2019-10-25 ENCOUNTER — Ambulatory Visit (INDEPENDENT_AMBULATORY_CARE_PROVIDER_SITE_OTHER): Payer: Medicare HMO | Admitting: Nurse Practitioner

## 2019-10-25 ENCOUNTER — Other Ambulatory Visit: Payer: Self-pay | Admitting: Nurse Practitioner

## 2019-10-25 ENCOUNTER — Other Ambulatory Visit: Payer: Self-pay

## 2019-10-25 VITALS — BP 150/70 | HR 66 | Temp 97.4°F | Ht 70.0 in | Wt 199.8 lb

## 2019-10-25 DIAGNOSIS — M8949 Other hypertrophic osteoarthropathy, multiple sites: Secondary | ICD-10-CM | POA: Diagnosis not present

## 2019-10-25 DIAGNOSIS — E663 Overweight: Secondary | ICD-10-CM

## 2019-10-25 DIAGNOSIS — I6930 Unspecified sequelae of cerebral infarction: Secondary | ICD-10-CM

## 2019-10-25 DIAGNOSIS — E876 Hypokalemia: Secondary | ICD-10-CM

## 2019-10-25 DIAGNOSIS — E1165 Type 2 diabetes mellitus with hyperglycemia: Secondary | ICD-10-CM | POA: Diagnosis not present

## 2019-10-25 DIAGNOSIS — E2839 Other primary ovarian failure: Secondary | ICD-10-CM

## 2019-10-25 DIAGNOSIS — E781 Pure hyperglyceridemia: Secondary | ICD-10-CM

## 2019-10-25 DIAGNOSIS — I10 Essential (primary) hypertension: Secondary | ICD-10-CM

## 2019-10-25 DIAGNOSIS — K219 Gastro-esophageal reflux disease without esophagitis: Secondary | ICD-10-CM

## 2019-10-25 DIAGNOSIS — M150 Primary generalized (osteo)arthritis: Secondary | ICD-10-CM

## 2019-10-25 DIAGNOSIS — Z780 Asymptomatic menopausal state: Secondary | ICD-10-CM

## 2019-10-25 DIAGNOSIS — E559 Vitamin D deficiency, unspecified: Secondary | ICD-10-CM

## 2019-10-25 DIAGNOSIS — M818 Other osteoporosis without current pathological fracture: Secondary | ICD-10-CM | POA: Diagnosis not present

## 2019-10-25 DIAGNOSIS — M159 Polyosteoarthritis, unspecified: Secondary | ICD-10-CM

## 2019-10-25 DIAGNOSIS — F339 Major depressive disorder, recurrent, unspecified: Secondary | ICD-10-CM

## 2019-10-25 DIAGNOSIS — N3281 Overactive bladder: Secondary | ICD-10-CM | POA: Insufficient documentation

## 2019-10-25 DIAGNOSIS — R110 Nausea: Secondary | ICD-10-CM | POA: Insufficient documentation

## 2019-10-25 DIAGNOSIS — F411 Generalized anxiety disorder: Secondary | ICD-10-CM

## 2019-10-25 LAB — BAYER DCA HB A1C WAIVED: HB A1C (BAYER DCA - WAIVED): 6.7 % (ref <7.0)

## 2019-10-25 MED ORDER — DULOXETINE HCL 60 MG PO CPEP: 60.0000 mg | ORAL_CAPSULE | Freq: Every day | ORAL | 1 refills | Status: DC

## 2019-10-25 MED ORDER — FENOFIBRATE 145 MG PO TABS: 145.0000 mg | ORAL_TABLET | Freq: Every day | ORAL | 1 refills | Status: DC

## 2019-10-25 MED ORDER — MIRABEGRON ER 25 MG PO TB24: 25.0000 mg | ORAL_TABLET | Freq: Every day | ORAL | 2 refills | Status: DC

## 2019-10-25 MED ORDER — GABAPENTIN 100 MG PO CAPS: 100.0000 mg | ORAL_CAPSULE | Freq: Three times a day (TID) | ORAL | 2 refills | Status: DC

## 2019-10-25 MED ORDER — METFORMIN HCL ER 500 MG PO TB24: ORAL_TABLET | ORAL | 1 refills | Status: DC

## 2019-10-25 MED ORDER — AMLODIPINE BESY-BENAZEPRIL HCL 10-40 MG PO CAPS: 1.0000 | ORAL_CAPSULE | Freq: Every day | ORAL | 1 refills | Status: DC

## 2019-10-25 MED ORDER — CHLORTHALIDONE 25 MG PO TABS: 12.5000 mg | ORAL_TABLET | Freq: Every day | ORAL | 1 refills | Status: DC

## 2019-10-25 MED ORDER — ROSUVASTATIN CALCIUM 40 MG PO TABS: ORAL_TABLET | ORAL | 1 refills | Status: DC

## 2019-10-25 MED ORDER — OMEPRAZOLE 20 MG PO CPDR: 20.0000 mg | DELAYED_RELEASE_CAPSULE | Freq: Two times a day (BID) | ORAL | 3 refills | Status: DC

## 2019-10-25 MED ORDER — BACLOFEN 5 MG PO TABS: 5.0000 mg | ORAL_TABLET | Freq: Three times a day (TID) | ORAL | 1 refills | Status: DC | PRN

## 2019-10-25 NOTE — Progress Notes (Signed)
Subjective:    Patient ID: Caitlin Mccarthy, female    DOB: 1935-09-28, 84 y.o.   MRN: [id number]   Chief Complaint: Medical Management of Chronic Issues (3 month) and Nausea (intermittent)    HPI:  1. Type 2 diabetes mellitus with hyperglycemia, without long-term current use of insulin ([place]) Patient states adherence to diet and compliance with Metformin. Fasting blood sugars running below 174. Lowest blood sugar has been around 90's Lab Results  Component Value Date   HGBA1C 6.6 07/23/2019     2. Essential hypertension, benign No c/o chest Pin, sob or headache. Occassionally checks blood pressure at home. BP Readings from Last 3 Encounters:  10/25/19 (!) 146/71  09/21/19 136/76  07/23/19 133/77     3. Overweight (BMI 25.0-29.9) No recent weight changes Wt Readings from Last 3 Encounters:  10/25/19 199 lb 12.8 oz (90.6 kg)  09/21/19 199 lb 12.8 oz (90.6 kg)  07/23/19 192 lb (87.1 kg)  ' BMI Readings from Last 3 Encounters:  10/25/19 28.67 kg/m  09/21/19 28.67 kg/m  07/23/19 27.55 kg/m     4. Pure hypertriglyceridemia   5. Late effect of cerebrovascular accident (CVA) Denies residual effects from CVA.  6. Gastroesophageal reflux disease without esophagitis Patient states Prilosec isn't helping. Increased heartburn and nausea.  7. Primary osteoarthritis involving multiple joints   8. Other osteoporosis without current pathological fracture   9. Hypokalemia   10. Episode of recurrent major depressive disorder, unspecified depression episode severity [facility])  Depression screen [facility] 2/9 10/25/2019 09/21/2019 07/23/2019 04/06/2019 03/01/2019  Decreased Interest 3 0 0 0 1  Down, Depressed, Hopeless 2 0 1 0 1  PHQ - 2 Score 5 0 1 0 2  Altered sleeping 1 - - - 3  Tired, decreased energy 3 - - - -  Change in appetite 0 - - - -  Feeling bad or failure about yourself  1 - - - -  Trouble concentrating 2 - - - -  Moving slowly or fidgety/restless 0 - - - -   Suicidal thoughts 0 - - - -  PHQ-9 Score 12 - - - 5  Difficult doing work/chores Somewhat difficult - - - -  Some recent data might be hidden     11. GAD (generalized anxiety disorder)  GAD 7 : Generalized Anxiety Score 10/25/2019  Nervous, Anxious, on Edge 1  Control/stop worrying 3  Worry too much - different things 3  Trouble relaxing 3  Restless 3  Easily annoyed or irritable 2  Afraid - awful might happen 3  Total GAD 7 Score 18  Anxiety Difficulty Somewhat difficult     12. Vitamin D deficiency Patient is taking a Vitamin D supplement. Denies increased dietary calcium intake.  13. Overactive bladder Patient states Mybertriq isn't helping with her urgency.     Outpatient Encounter Medications as of 10/25/2019  Medication Sig  . amLODipine-benazepril (LOTREL) 10-40 MG capsule TAKE ONE CAPSULE EACH MORNING  . aspirin EC 81 MG tablet Take 1 tablet (81 mg total) by mouth daily.  . chlorthalidone (HYGROTON) 25 MG tablet TAKE 1/2 TABLET DAILY  . cholecalciferol (VITAMIN D3) 25 MCG (1000 UNIT) tablet Take 1,000 Units by mouth daily.  . DULoxetine (CYMBALTA) 60 MG capsule Take 1 capsule (60 mg total) by mouth daily.  . fenofibrate (TRICOR) 145 MG tablet TAKE ONE (1) TABLET EACH DAY  . gabapentin (NEURONTIN) 100 MG capsule Take 100 mg by mouth daily.  . metFORMIN (GLUCOPHAGE-XR) 500 MG 24 hr  tablet TAKE 2 TABLETS EVERY MORNING WITH BREAKFAST  . NYSTATIN powder APPLY 4 TIMES DAILY  . omeprazole (PRILOSEC) 20 MG capsule Take 1 capsule (20 mg total) by mouth 2 (two) times daily before a meal.  . ondansetron (ZOFRAN ODT) 4 MG disintegrating tablet Take 1 tablet (4 mg total) by mouth every 8 (eight) hours as needed for nausea or vomiting.  [name] PREMARIN vaginal cream Apply 1 application topically daily as needed (irritation).   . rosuvastatin (CRESTOR) 40 MG tablet TAKE ONE (1) TABLET EACH DAY  . traMADol (ULTRAM) 50 MG tablet TAKE ONE TABLET FOUR TIMES DAILY  . Baclofen 5 MG TABS  Take 5 mg by mouth 3 (three) times daily as needed. (Patient not taking: Reported on 10/25/2019)  . fluticasone (FLONASE) 50 MCG/ACT nasal spray Place 2 sprays into both nostrils daily. (Patient not taking: Reported on 10/25/2019)  . mirabegron ER (MYRBETRIQ) 25 MG TB24 tablet Take 1 tablet (25 mg total) by mouth daily. (Patient not taking: Reported on 10/25/2019)  . [DISCONTINUED] Vitamin D, Ergocalciferol, (DRISDOL) 1.25 MG (50000 UT) CAPS capsule Take 1 capsule (50,000 Units total) by mouth every 7 (seven) days. (Patient not taking: Reported on 10/25/2019)   No facility-administered encounter medications on file as of 10/25/2019.    Past Surgical History:  Procedure Laterality Date  . CATARACT EXTRACTION W/PHACO  06/06/2011   Procedure: CATARACT EXTRACTION PHACO AND INTRAOCULAR LENS PLACEMENT (IOC);  Surgeon: Tonny Branch;  Location: [facility];  Service: Ophthalmology;  Laterality: Right;  CDE=12.77  . CATARACT EXTRACTION W/PHACO  06/27/2011   Procedure: CATARACT EXTRACTION PHACO AND INTRAOCULAR LENS PLACEMENT (IOC);  Surgeon: Tonny Branch;  Location: [facility];  Service: Ophthalmology;  Laterality: Left;  CDE:13.96  . CYSTOSCOPY WITH URETEROSCOPY AND STENT PLACEMENT Right 04/27/2015   Procedure: CYSTOSCOPY WITH RIGHT URETEROSCOPY, BASKET REMOVAL OF STONE, REMOVAL OF RIGHT NEPHROSTOMY TUBE;  Surgeon: Irine Seal, MD;  Location: [place];  Service: Urology;  Laterality: Right;  . CYSTOSCOPY/URETEROSCOPY/HOLMIUM LASER/STENT PLACEMENT Left 12/24/2016   Procedure: LEFT URETEROSCOPY WITH HOLMIUM LASER AND STENT PLACEMENT;  Surgeon: Irine Seal, MD;  Location: [facility];  Service: Urology;  Laterality: Left;  . EXTRACORPOREAL SHOCK WAVE LITHOTRIPSY  left 04-01-2016;  1980's  . FRACTURE SURGERY    . HIP ARTHROPLASTY    . HOLMIUM LASER APPLICATION Right [id number]/[id number]   Procedure: HOLMIUM LASER APPLICATION;  Surgeon: Irine Seal, MD;  Location: [facility];  Service:  Urology;  Laterality: Right;  . KNEE ARTHROPLASTY    . KNEE ARTHROSCOPY Bilateral right 2005//  left ?  [name] LOOP RECORDER INSERTION N/A 04/01/2017   Procedure: LOOP RECORDER INSERTION;  Surgeon: Thompson Grayer, MD;  Location: [facility];  Service: Cardiovascular;  Laterality: N/A;  . TOTAL HIP ARTHROPLASTY  10/28/2011   Procedure: TOTAL HIP ARTHROPLASTY;  Surgeon: Gearlean Alf, MD;  Location: [facility];  Service: Orthopedics;  Laterality: Right;  . TOTAL KNEE ARTHROPLASTY Bilateral left 03-09-2007//  right 2006  . TRANSTHORACIC ECHOCARDIOGRAM  10/06/2006   normal echo,  ef 55-60%  . TUBAL LIGATION      Family History  Problem Relation Age of Onset  . Kidney disease Mother   . Diabetes Mother   . Congestive Heart Failure Father   . Heart failure Father   . Heart disease Brother   . Alcohol abuse Brother   . Anesthesia problems Neg Hx   . Hypotension Neg Hx   . Malignant hyperthermia Neg Hx   .  Pseudochol deficiency Neg Hx     New complaints: Patient has new complaints of nausea over the past month. This problem occurs throughout the day and is associated with dizziness. The nausea is aggravated by greasy foods and is relieved by ginger ale, crackers, and Zofran.   Social history: Denies new social complaints        Review of Systems  Gastrointestinal: Positive for nausea.  Musculoskeletal: Positive for arthralgias and gait problem.  Neurological: Positive for dizziness.  All other systems reviewed and are negative.      Objective:   Physical Exam Constitutional:      Appearance: Normal appearance.  HENT:     Head: Normocephalic.  Eyes:     Conjunctiva/sclera: Conjunctivae normal.  Cardiovascular:     Rate and Rhythm: Normal rate. Rhythm irregular.     Heart sounds: Normal heart sounds.  Pulmonary:     Breath sounds: Normal breath sounds.  Abdominal:     General: Bowel sounds are normal.     Palpations: Abdomen is soft.  Musculoskeletal:     Right lower  leg: No edema.     Left lower leg: No edema.  Neurological:     Mental Status: She is alert and oriented to person, place, and time.       BP (!) 150/70 (BP Location: Right Arm, Cuff Size: Normal)   Pulse 66   Temp (!) 97.4 F (36.3 C) (Temporal)   Ht '5\' 10"'  (1.778 m)   Wt 199 lb 12.8 oz (90.6 kg)   BMI 28.67 kg/m      Assessment & Plan:  CAIYA BETTES comes in today with chief complaint of Medical Management of Chronic Issues (3 month) and Nausea (intermittent)   Diagnosis and orders addressed:  1. Type 2 diabetes mellitus with hyperglycemia, without long-term current use of insulin (HCC) - CBC with Differential/Platelet - CMP14+EGFR - Bayer DCA Hb A1c Waived - metFORMIN (GLUCOPHAGE-XR) 500 MG 24 hr tablet; TAKE 2 TABLETS EVERY MORNING WITH BREAKFAST  Dispense: 180 tablet; Refill: 1  2. Essential hypertension, benign - CBC with Differential/Platelet - CMP14+EGFR - amLODipine-benazepril (LOTREL) 10-40 MG capsule; Take 1 capsule by mouth daily.  Dispense: 90 capsule; Refill: 1 - chlorthalidone (HYGROTON) 25 MG tablet; Take 0.5 tablets (12.5 mg total) by mouth daily.  Dispense: 90 tablet; Refill: 1  3. Overweight (BMI 25.0-29.9) - CBC with Differential/Platelet - CMP14+EGFR  4. Pure hypertriglyceridemia - fenofibrate (TRICOR) 145 MG tablet; Take 1 tablet (145 mg total) by mouth daily.  Dispense: 90 tablet; Refill: 1 - rosuvastatin (CRESTOR) 40 MG tablet; TAKE ONE (1) TABLET EACH DAY  Dispense: 90 tablet; Refill: 1  5. Late effect of cerebrovascular accident (CVA)   6. Gastroesophageal reflux disease without esophagitis - omeprazole (PRILOSEC) 20 MG capsule; Take 1 capsule (20 mg total) by mouth 2 (two) times daily before a meal.  Dispense: 180 capsule; Refill: 3  7. Primary osteoarthritis involving multiple joints   8. Other osteoporosis without current pathological fracture - gabapentin (NEURONTIN) 100 MG capsule; Take 1 capsule (100 mg total) by mouth 3  (three) times daily.  Dispense: 90 capsule; Refill: 2  9. Hypokalemia   10. Episode of recurrent major depressive disorder, unspecified depression episode severity ([place]) - DULoxetine (CYMBALTA) 60 MG capsule; Take 1 capsule (60 mg total) by mouth daily.  Dispense: 90 capsule; Refill: 1  11. GAD (generalized anxiety disorder) - DULoxetine (CYMBALTA) 60 MG capsule; Take 1 capsule (60 mg total) by mouth daily.  Dispense:  90 capsule; Refill: 1  12. Vitamin D deficiency  13. Nausea - Ambulatory referral to Gastroenterology for nausea and increased heartburn.  14. Overactive bladder Patient is not taking Myrbetriq. Will defer OAB symptoms at this time.   Labs pending Health Maintenance reviewed Diet and exercise encouraged  Follow up plan: Follow up in 3 months or sooner if new complaints arise.  Robynn Pane, RN, FNP student  Mary-Margaret Hassell Done, FNP

## 2019-10-25 NOTE — Patient Instructions (Signed)

## 2019-10-26 ENCOUNTER — Encounter: Payer: Self-pay | Admitting: Nurse Practitioner

## 2019-10-26 LAB — CBC WITH DIFFERENTIAL/PLATELET
Basophils Absolute: 0.1 10*3/uL (ref 0.0–0.2)
Basos: 1 %
EOS (ABSOLUTE): 0.2 10*3/uL (ref 0.0–0.4)
Eos: 3 %
Hematocrit: 40.2 % (ref 34.0–46.6)
Hemoglobin: 13.3 g/dL (ref 11.1–15.9)
Immature Grans (Abs): 0 10*3/uL (ref 0.0–0.1)
Immature Granulocytes: 0 %
Lymphocytes Absolute: 2.7 10*3/uL (ref 0.7–3.1)
Lymphs: 42 %
MCH: 30.8 pg (ref 26.6–33.0)
MCHC: 33.1 g/dL (ref 31.5–35.7)
MCV: 93 fL (ref 79–97)
Monocytes Absolute: 0.6 10*3/uL (ref 0.1–0.9)
Monocytes: 10 %
Neutrophils Absolute: 2.7 10*3/uL (ref 1.4–7.0)
Neutrophils: 44 %
Platelets: 215 10*3/uL (ref 150–450)
RBC: 4.32 x10E6/uL (ref 3.77–5.28)
RDW: 12.2 % (ref 11.7–15.4)
WBC: 6.3 10*3/uL (ref 3.4–10.8)

## 2019-10-26 LAB — CMP14+EGFR
ALT: 16 IU/L (ref 0–32)
AST: 29 IU/L (ref 0–40)
Albumin/Globulin Ratio: 1.8 (ref 1.2–2.2)
Albumin: 4.5 g/dL (ref 3.6–4.6)
Alkaline Phosphatase: 60 IU/L (ref 39–117)
BUN/Creatinine Ratio: 24 (ref 12–28)
BUN: 23 mg/dL (ref 8–27)
Bilirubin Total: 0.4 mg/dL (ref 0.0–1.2)
CO2: 24 mmol/L (ref 20–29)
Calcium: 11.8 mg/dL — ABNORMAL HIGH (ref 8.7–10.3)
Chloride: 100 mmol/L (ref 96–106)
Creatinine, Ser: 0.94 mg/dL (ref 0.57–1.00)
GFR calc Af Amer: 66 mL/min/{1.73_m2} (ref >59)
GFR calc non Af Amer: 57 mL/min/{1.73_m2} — ABNORMAL LOW (ref >59)
Globulin, Total: 2.5 g/dL (ref 1.5–4.5)
Glucose: 110 mg/dL — ABNORMAL HIGH (ref 65–99)
Potassium: 4.7 mmol/L (ref 3.5–5.2)
Sodium: 137 mmol/L (ref 134–144)
Total Protein: 7 g/dL (ref 6.0–8.5)

## 2019-11-03 DIAGNOSIS — R8271 Bacteriuria: Secondary | ICD-10-CM | POA: Diagnosis not present

## 2019-11-04 LAB — CUP PACEART REMOTE DEVICE CHECK
Date Time Interrogation Session: 20210505041614
Implantable Pulse Generator Implant Date: 20181002

## 2019-11-08 ENCOUNTER — Ambulatory Visit (INDEPENDENT_AMBULATORY_CARE_PROVIDER_SITE_OTHER): Payer: Medicare HMO | Admitting: *Deleted

## 2019-11-08 DIAGNOSIS — I63439 Cerebral infarction due to embolism of unspecified posterior cerebral artery: Secondary | ICD-10-CM | POA: Diagnosis not present

## 2019-11-09 ENCOUNTER — Encounter: Payer: Self-pay | Admitting: Nurse Practitioner

## 2019-11-09 ENCOUNTER — Ambulatory Visit: Payer: Medicare HMO | Admitting: Nurse Practitioner

## 2019-11-09 ENCOUNTER — Other Ambulatory Visit: Payer: Medicare HMO

## 2019-11-09 ENCOUNTER — Other Ambulatory Visit: Payer: Self-pay

## 2019-11-09 VITALS — BP 120/60 | HR 72 | Temp 97.5°F | Ht 70.0 in | Wt 202.8 lb

## 2019-11-09 DIAGNOSIS — K219 Gastro-esophageal reflux disease without esophagitis: Secondary | ICD-10-CM

## 2019-11-09 DIAGNOSIS — K5909 Other constipation: Secondary | ICD-10-CM

## 2019-11-09 DIAGNOSIS — R110 Nausea: Secondary | ICD-10-CM

## 2019-11-09 NOTE — Patient Instructions (Addendum)
If you are age 82 or older, your body mass index should be between 23-30. Your Body mass index is 29.1 kg/m. If this is out of the aforementioned range listed, please consider follow up with your Primary Care Provider.  If you are age 78 or younger, your body mass index should be between 19-25. Your Body mass index is 29.1 kg/m. If this is out of the aformentioned range listed, please consider follow up with your Primary Care Provider.   STOP Omeprazole for 14 days then do your stool study. (H.pylori test)  AFTER completing your stool study restart your Omeprazole 20 mg 1 capsule 30 minutes prior to breakfast.  Take Miralax 1 capful daily in 8 ounces of water or juice.  You have been scheduled to follow up with Caitlin Savoy, NP on December 01, 2019 at 11:00 am

## 2019-11-09 NOTE — Progress Notes (Signed)
Carelink Summary Report / Loop Recorder 

## 2019-11-09 NOTE — Progress Notes (Signed)
ASSESSMENT / PLAN:  84 year old female with PMH significant for hypertension 84 year old female with PMH significant for hypertension, anxiety, depression, hyperlipidemia, osteoporosis, osteoarthritis, vitamin D deficiency, diabetes, stroke  # Nausea without vomiting --Started 6 months ago. --Not associated with any significant weight loss --Query gastroparesis?  PUD in the setting of daily NSAIDs?  --Difficult for patient to not take NSAIDs given osteoarthritis.  She does take Tylenol.  --She has a history of H. pylori infection in 2017.[name] Hold PPI x14 days and check H. pylori stool antigen.  If positive then treat and see if symptoms improve.  If negative then she could still have PUD secondary to NSAIDs --Patient is not taking PPI on a regular basis.  I have asked her to start taking Prilosec every day 30 minutes before breakfast.  She needs to do this given NSAID use but also for uncontrolled GERD symptoms.  --I will see her back in a few weeks.  If nausea persists then will discuss EGD versus short trial of Reglan first --She can continue Zofran in the interim  # GERD manifested as pyrosis --She is NOT taking her PPI as directed.  It was prescribed at twice daily but she is taking 1 dose every few days --I have asked that she take Prilosec every morning 30 minutes before breakfast --Antireflux measures discussed. --I will see her back in follow-up in 3 weeks.  If still having reflux we may increase her PPI to twice daily and/or proceed with EGD if she is agreeable  # Chronic constipation --Intermittent symptoms --Continue good hydration --She can try MiraLAX 1 capful daily in 8 ounces of water  HPI:     Chief Complaint: Nausea  84 year old female referred by PCP for evaluation of nausea.  She is here with her daughter. Nausea started about 6 months ago, it is intermittent.  Generally not nauseated in the a.m.  Symptoms occur 2 to 3 hours after a meal.  Patient has a  history of diabetes, her blood sugars are usually under 200 at home . Some weeks she may have several episodes of nausea but then may go days without symptoms.  She does not vomit.  Antiemetics help the nausea.  She cannot correlate onset of nausea with starting any new medications.  She takes a daily baby aspirin.  She takes 2-3 doses of Aleve or ibuprofen a day on a chronic basis  Patient also complains of GERD with heartburn.  She has been prescribed twice daily Prilosec but admits to not taking it on a regular basis.  She goes days without taking a dose.  She does not go to bed on an empty stomach.  Drinks 3 cups of coffee a day  Patient also gives a history of chronic constipation.  Constipation occurs intermittently.  She averages about 5 glasses of water a day.  Never had a colonoscopy. No blood in stool.    Data Reviewed:  10/25/2019 CMP remarkable for calcium of 11.8.   CBC was normal   Past Medical History:  Diagnosis Date  . Anxiety   . Depression   . GERD (gastroesophageal reflux disease)   . History of acute pyelonephritis    last episode 04-13-2015  w/ sepsis  . History of kidney stones   . History of recurrent UTIs    MULTIPLE  . Hyperlipidemia   . Hypertension   . OA (osteoarthritis)    knees, hips, hands  . Osteoporosis   .  Poor memory    especially when has uti  . Renal calculus, left   . Stroke ([place])   . Type 2 diabetes mellitus ([place])   . Vitamin D deficiency 05/10/2016     Past Surgical History:  Procedure Laterality Date  . CATARACT EXTRACTION W/PHACO  06/06/2011   Procedure: CATARACT EXTRACTION PHACO AND INTRAOCULAR LENS PLACEMENT (IOC);  Surgeon: Tonny Branch;  Location: [facility];  Service: Ophthalmology;  Laterality: Right;  CDE=12.77  . CATARACT EXTRACTION W/PHACO  06/27/2011   Procedure: CATARACT EXTRACTION PHACO AND INTRAOCULAR LENS PLACEMENT (IOC);  Surgeon: Tonny Branch;  Location: [facility];  Service: Ophthalmology;  Laterality: Left;  CDE:13.96  .  CYSTOSCOPY WITH URETEROSCOPY AND STENT PLACEMENT Right 04/27/2015   Procedure: CYSTOSCOPY WITH RIGHT URETEROSCOPY, BASKET REMOVAL OF STONE, REMOVAL OF RIGHT NEPHROSTOMY TUBE;  Surgeon: Irine Seal, MD;  Location: [place];  Service: Urology;  Laterality: Right;  . CYSTOSCOPY/URETEROSCOPY/HOLMIUM LASER/STENT PLACEMENT Left 12/24/2016   Procedure: LEFT URETEROSCOPY WITH HOLMIUM LASER AND STENT PLACEMENT;  Surgeon: Irine Seal, MD;  Location: [facility];  Service: Urology;  Laterality: Left;  . EXTRACORPOREAL SHOCK WAVE LITHOTRIPSY  left 04-01-2016;  1980's  . FRACTURE SURGERY    . HIP ARTHROPLASTY    . HOLMIUM LASER APPLICATION Right [id number]   Procedure: HOLMIUM LASER APPLICATION;  Surgeon: Irine Seal, MD;  Location: [facility];  Service: Urology;  Laterality: Right;  . KNEE ARTHROPLASTY    . KNEE ARTHROSCOPY Bilateral right 2005//  left ?  [name] LOOP RECORDER INSERTION N/A 04/01/2017   Procedure: LOOP RECORDER INSERTION;  Surgeon: Thompson Grayer, MD;  Location: [place] [facility];  Service: Cardiovascular;  Laterality: N/A;  . TOTAL HIP ARTHROPLASTY  10/28/2011   Procedure: TOTAL HIP ARTHROPLASTY;  Surgeon: Gearlean Alf, MD;  Location: [facility];  Service: Orthopedics;  Laterality: Right;  . TOTAL KNEE ARTHROPLASTY Bilateral left 03-09-2007//  right 2006  . TRANSTHORACIC ECHOCARDIOGRAM  10/06/2006   normal echo,  ef 55-60%  . TUBAL LIGATION     Family History  Problem Relation Age of Onset  . Kidney disease Mother   . Diabetes Mother   . Congestive Heart Failure Father   . Heart failure Father   . Heart disease Brother   . Alcohol abuse Brother   . Anesthesia problems Neg Hx   . Hypotension Neg Hx   . Malignant hyperthermia Neg Hx   . Pseudochol deficiency Neg Hx    Social History   Tobacco Use  . Smoking status: Current Every Day Smoker    Packs/day: 0.25    Years: 42.00    Pack years: 10.50    Types: Cigarettes  . Smokeless  tobacco: Never Used  . Tobacco comment: 1 pack last 4 days   Substance Use Topics  . Alcohol use: No  . Drug use: No   Current Outpatient Medications  Medication Sig Dispense Refill  . amLODipine-benazepril (LOTREL) 10-40 MG capsule Take 1 capsule by mouth daily. 90 capsule 1  . aspirin EC 81 MG tablet Take 1 tablet (81 mg total) by mouth daily.    . Baclofen 5 MG TABS Take 5 mg by mouth 3 (three) times daily as needed. 90 tablet 1  . chlorthalidone (HYGROTON) 25 MG tablet Take 0.5 tablets (12.5 mg total) by mouth daily. 90 tablet 1  . cholecalciferol (VITAMIN D3) 25 MCG (1000 UNIT) tablet Take 1,000 Units by mouth daily.    . DULoxetine (CYMBALTA) 60 MG capsule Take  1 capsule (60 mg total) by mouth daily. 90 capsule 1  . fenofibrate (TRICOR) 145 MG tablet Take 1 tablet (145 mg total) by mouth daily. 90 tablet 1  . fluticasone (FLONASE) 50 MCG/ACT nasal spray Place 2 sprays into both nostrils daily. 16 g 6  . gabapentin (NEURONTIN) 100 MG capsule Take 1 capsule (100 mg total) by mouth 3 (three) times daily. 90 capsule 2  . metFORMIN (GLUCOPHAGE-XR) 500 MG 24 hr tablet TAKE 2 TABLETS EVERY MORNING WITH BREAKFAST 180 tablet 1  . NYSTATIN powder APPLY 4 TIMES DAILY 60 g 1  . omeprazole (PRILOSEC) 20 MG capsule Take 1 capsule (20 mg total) by mouth 2 (two) times daily before a meal. 180 capsule 3  . ondansetron (ZOFRAN ODT) 4 MG disintegrating tablet Take 1 tablet (4 mg total) by mouth every 8 (eight) hours as needed for nausea or vomiting. 20 tablet 0  . PREMARIN vaginal cream Apply 1 application topically daily as needed (irritation).     . rosuvastatin (CRESTOR) 40 MG tablet TAKE ONE (1) TABLET EACH DAY 90 tablet 1  . traMADol (ULTRAM) 50 MG tablet TAKE ONE TABLET FOUR TIMES DAILY 60 tablet 2   No current facility-administered medications for this visit.   Allergies  Allergen Reactions  . Iodinated Diagnostic Agents   . Latex      Review of Systems: Positive for anxiety, arthritis,  confusion, depression, fatigue, hearing problems, muscle pain and cramps, shortness of breath.   All other systems reviewed and negative except where noted in HPI.   Serum creatinine: 0.94 mg/dL 10/25/19 1035 Estimated creatinine clearance: 57.7 mL/min   Physical Exam:    Wt Readings from Last 3 Encounters:  11/09/19 202 lb 12.8 oz (92 kg)  10/25/19 199 lb 12.8 oz (90.6 kg)  09/21/19 199 lb 12.8 oz (90.6 kg)    BP 120/60   Pulse 72   Temp (!) 97.5 F (36.4 C)   Ht 5\' 10"  (1.778 m)   Wt 202 lb 12.8 oz (92 kg)   BMI 29.10 kg/m  Constitutional:  Pleasant female in no acute distress. Psychiatric: Normal mood and affect. Behavior is normal. EENT: Pupils normal.  Conjunctivae are normal. No scleral icterus. Neck supple.  Cardiovascular: Normal rate, regular rhythm, + murmur. No edema Pulmonary/chest: Effort normal and breath sounds normal. No wheezing, rales or rhonchi. Abdominal: Soft, nondistended, nontender. Bowel sounds active throughout. There are no masses palpable. No hepatomegaly. Neurological: Alert and oriented to person place and time. Skin: Skin is warm and dry. No rashes noted.  Tye Savoy, NP  11/09/2019, 11:31 AM  Cc:  Referring Provider Hassell Done, Mary-Margaret, *

## 2019-11-19 NOTE — Progress Notes (Signed)
Reviewed and agree with documentation and assessment and plan. K. Veena Nandigam , MD   

## 2019-11-24 ENCOUNTER — Other Ambulatory Visit: Payer: Medicare HMO

## 2019-11-24 DIAGNOSIS — K5909 Other constipation: Secondary | ICD-10-CM | POA: Diagnosis not present

## 2019-11-24 DIAGNOSIS — R110 Nausea: Secondary | ICD-10-CM | POA: Diagnosis not present

## 2019-11-25 LAB — HELICOBACTER PYLORI  SPECIAL ANTIGEN
MICRO NUMBER:: 10522527
SPECIMEN QUALITY: ADEQUATE

## 2019-11-27 ENCOUNTER — Other Ambulatory Visit: Payer: Self-pay | Admitting: Family

## 2019-11-27 DIAGNOSIS — M1612 Unilateral primary osteoarthritis, left hip: Secondary | ICD-10-CM

## 2019-11-27 DIAGNOSIS — Z0289 Encounter for other administrative examinations: Secondary | ICD-10-CM

## 2019-12-01 ENCOUNTER — Encounter: Payer: Self-pay | Admitting: Nurse Practitioner

## 2019-12-01 ENCOUNTER — Ambulatory Visit (INDEPENDENT_AMBULATORY_CARE_PROVIDER_SITE_OTHER): Payer: Medicare HMO | Admitting: Nurse Practitioner

## 2019-12-01 VITALS — BP 128/72 | HR 76 | Ht 70.0 in | Wt 201.4 lb

## 2019-12-01 DIAGNOSIS — K219 Gastro-esophageal reflux disease without esophagitis: Secondary | ICD-10-CM

## 2019-12-01 DIAGNOSIS — R110 Nausea: Secondary | ICD-10-CM | POA: Diagnosis not present

## 2019-12-01 DIAGNOSIS — K5909 Other constipation: Secondary | ICD-10-CM

## 2019-12-01 NOTE — Patient Instructions (Signed)
If you are age 82 or older, your body mass index should be between 23-30. Your Body mass index is 28.89 kg/m. If this is out of the aforementioned range listed, please consider follow up with your Primary Care Provider.  If you are age 39 or younger, your body mass index should be between 19-25. Your Body mass index is 28.89 kg/m. If this is out of the aformentioned range listed, please consider follow up with your Primary Care Provider.   Start Famotidine 20 mg three times weekly in the morning.   Continue Miralax every other day and can titrate upwards to once daily if needed.  Call with any recurrent symptoms, ask for Beth.

## 2019-12-01 NOTE — Progress Notes (Signed)
IMPRESSION and PLAN:   84 year old female with PMH significant for hypertension, anxiety, depression, hyperlipidemia, osteoporosis, osteoarthritis, vitamin D deficiency, diabetes, stroke  # Nausea --Totally resolved after discontinuation of ibuprofen following our last visit.  She is currently using Tylenol and Ultram as needed for arthritis --Given resolution of symptoms there is no need for further work-up.  Daughter/patient will call for any recurrent nausea  # GERD --She has a history of GERD and was in fact having heartburn up until recently.  She held PPI for H. pylori stool antigen study and never did resume it.  --Currently asymptomatic from a GERD standpoint but I am concerned that she may eventually get recurrent symptoms. --We will try famotidine 20 mg every morning 3 times a week.  Should she get recurrent/breakthrough symptoms, patient will call the office for further recommendations.   # Chronic constipation --Significantly improved with QOD. She is having 3 bowel movements a week.  Can titrate Miralax dose upward to once daily if desired --Call for recurrent problems      HPI:    Primary GI: Caitlin Bowie, MD   Chief complaint : follow up GERD, nausea and chronic constipation constipation  11/09/2019 -I saw her in clinic for evaluation of nausea of 6 months duration.  She also complained of heartburn and chronic constipation.  Regarding nausea, she has been taking NSAIDs for osteoarthritis.  She was not taking PPI on a regular basis despite history of GERD and ongoing heartburn.  She gave a history of H. pylori infection in 2017.  I asked her to discontinue ibuprofen and to use Tylenol for pain instead.  PPI was held for 2 weeks for H. pylori stool antigen study which returned negative.  She was prescribed Zofran to take as needed for nausea.  For constipation she was advised to start MiraLAX 1 capful in 8 ounces of water daily.  Patient is here with her  daughter for follow-up  HISTORY SINCE LAST VISIT: Caitlin Mccarthy feels much better.  She never did restart her PPI after holding it for the H. pylori stool antigen study.  Despite being off PPI altogether she is not having any GERD symptoms at present.  The nausea has totally resolved after immediately stopping ibuprofen following her last visit.  She has not required Zofran.  For constipation she has been taking MiraLAX every other day and having a bowel movement 3 times a week.  Patient is very pleased with the way she feels  Review of systems:     No chest pain, no SOB, no fevers, no urinary sx   Past Medical History:  Diagnosis Date  . Anxiety   . Depression   . GERD (gastroesophageal reflux disease)   . History of acute pyelonephritis    last episode 04-13-2015  w/ sepsis  . History of kidney stones   . History of recurrent UTIs    MULTIPLE  . Hyperlipidemia   . Hypertension   . OA (osteoarthritis)    knees, hips, hands  . Osteoporosis   . Poor memory    especially when has uti  . Renal calculus, left   . Stroke ([place])   . Type 2 diabetes mellitus ([place])   . Vitamin D deficiency 05/10/2016    Patient's surgical history, family medical history, social history, medications and allergies were all reviewed in Epic   Creatinine clearance cannot be calculated (Patient's most recent lab result is older than the maximum  21 days allowed.)  Current Outpatient Medications  Medication Sig Dispense Refill  . amLODipine-benazepril (LOTREL) 10-40 MG capsule Take 1 capsule by mouth daily. 90 capsule 1  . aspirin EC 81 MG tablet Take 1 tablet (81 mg total) by mouth daily.    . Baclofen 5 MG TABS Take 5 mg by mouth 3 (three) times daily as needed. 90 tablet 1  . chlorthalidone (HYGROTON) 25 MG tablet Take 0.5 tablets (12.5 mg total) by mouth daily. 90 tablet 1  . cholecalciferol (VITAMIN D3) 25 MCG (1000 UNIT) tablet Take 1,000 Units by mouth daily.    . DULoxetine (CYMBALTA) 60 MG capsule Take  1 capsule (60 mg total) by mouth daily. 90 capsule 1  . fenofibrate (TRICOR) 145 MG tablet Take 1 tablet (145 mg total) by mouth daily. 90 tablet 1  . fluticasone (FLONASE) 50 MCG/ACT nasal spray Place 2 sprays into both nostrils daily. 16 g 6  . gabapentin (NEURONTIN) 100 MG capsule Take 1 capsule (100 mg total) by mouth 3 (three) times daily. 90 capsule 2  . metFORMIN (GLUCOPHAGE-XR) 500 MG 24 hr tablet TAKE 2 TABLETS EVERY MORNING WITH BREAKFAST 180 tablet 1  . NYSTATIN powder APPLY 4 TIMES DAILY 60 g 1  . ondansetron (ZOFRAN ODT) 4 MG disintegrating tablet Take 1 tablet (4 mg total) by mouth every 8 (eight) hours as needed for nausea or vomiting. 20 tablet 0  . polyethylene glycol (MIRALAX / GLYCOLAX) 17 g packet Take 17 g by mouth daily.    [name] PREMARIN vaginal cream Apply 1 application topically daily as needed (irritation).     . rosuvastatin (CRESTOR) 40 MG tablet TAKE ONE (1) TABLET EACH DAY 90 tablet 1  . traMADol (ULTRAM) 50 MG tablet TAKE ONE TABLET FOUR TIMES DAILY 60 tablet 2   No current facility-administered medications for this visit.    Physical Exam:     BP 128/72   Pulse 76   Ht 5\' 10"  (1.778 m)   Wt 201 lb 6 oz (91.3 kg)   BMI 28.89 kg/m   GENERAL:  Pleasant female in NAD PSYCH: : Cooperative, normal affect CARDIAC:  RRR PULM: Normal respiratory effort, lungs CTA bilaterally, no wheezing ABDOMEN:  Limited exam performed in chair. Abdomen nondistended, soft, nontender  normal bowel sounds NEURO: Alert and oriented x 3, no focal neurologic deficits   Tye Savoy , NP 12/01/2019, 1:24 PM

## 2019-12-03 NOTE — Progress Notes (Signed)
Reviewed and agree with documentation and assessment and plan. K. Veena Terrina Docter , MD   

## 2019-12-10 DIAGNOSIS — E119 Type 2 diabetes mellitus without complications: Secondary | ICD-10-CM | POA: Diagnosis not present

## 2019-12-10 DIAGNOSIS — H40033 Anatomical narrow angle, bilateral: Secondary | ICD-10-CM | POA: Diagnosis not present

## 2019-12-13 ENCOUNTER — Ambulatory Visit (INDEPENDENT_AMBULATORY_CARE_PROVIDER_SITE_OTHER): Payer: Medicare HMO | Admitting: *Deleted

## 2019-12-13 DIAGNOSIS — I63439 Cerebral infarction due to embolism of unspecified posterior cerebral artery: Secondary | ICD-10-CM

## 2019-12-13 LAB — CUP PACEART REMOTE DEVICE CHECK
Date Time Interrogation Session: 20210613234048
Implantable Pulse Generator Implant Date: 20181002

## 2019-12-15 NOTE — Progress Notes (Signed)
Carelink Summary Report / Loop Recorder 

## 2019-12-20 ENCOUNTER — Other Ambulatory Visit: Payer: Self-pay | Admitting: Family

## 2019-12-20 DIAGNOSIS — Z0289 Encounter for other administrative examinations: Secondary | ICD-10-CM

## 2019-12-20 DIAGNOSIS — M1612 Unilateral primary osteoarthritis, left hip: Secondary | ICD-10-CM

## 2019-12-21 ENCOUNTER — Telehealth: Payer: Self-pay | Admitting: Nurse Practitioner

## 2019-12-21 NOTE — Telephone Encounter (Signed)
Aware not able to refill medication outside of an office visit. Appt made for 12/23/19

## 2019-12-21 NOTE — Telephone Encounter (Signed)
  Prescription Request  12/21/2019  What is the name of the medication or equipment? Tramadol  Have you contacted your pharmacy to request a refill? (if applicable) Yes  Which pharmacy would you like this sent to? [facility]   Patient notified that their request is being sent to the clinical staff for review and that they should receive a response within 2 business days.   MMM's pt.  She was seen 4 weeks ago & she let her date run out on her RX before she went to get filled.  Please call daughter.

## 2019-12-23 ENCOUNTER — Encounter: Payer: Self-pay | Admitting: Nurse Practitioner

## 2019-12-23 ENCOUNTER — Other Ambulatory Visit: Payer: Self-pay

## 2019-12-23 ENCOUNTER — Ambulatory Visit (INDEPENDENT_AMBULATORY_CARE_PROVIDER_SITE_OTHER): Payer: Medicare HMO | Admitting: Nurse Practitioner

## 2019-12-23 VITALS — BP 138/80 | HR 95 | Temp 97.1°F | Resp 20 | Ht 70.0 in | Wt 202.0 lb

## 2019-12-23 DIAGNOSIS — R300 Dysuria: Secondary | ICD-10-CM | POA: Diagnosis not present

## 2019-12-23 DIAGNOSIS — M159 Polyosteoarthritis, unspecified: Secondary | ICD-10-CM

## 2019-12-23 DIAGNOSIS — N3000 Acute cystitis without hematuria: Secondary | ICD-10-CM | POA: Diagnosis not present

## 2019-12-23 DIAGNOSIS — M8949 Other hypertrophic osteoarthropathy, multiple sites: Secondary | ICD-10-CM | POA: Diagnosis not present

## 2019-12-23 DIAGNOSIS — M1612 Unilateral primary osteoarthritis, left hip: Secondary | ICD-10-CM | POA: Diagnosis not present

## 2019-12-23 DIAGNOSIS — Z0289 Encounter for other administrative examinations: Secondary | ICD-10-CM

## 2019-12-23 LAB — MICROSCOPIC EXAMINATION
Renal Epithel, UA: NONE SEEN /hpf
WBC, UA: >30 /hpf — AB (ref 0–5)

## 2019-12-23 LAB — URINALYSIS, COMPLETE
Bilirubin, UA: NEGATIVE
Glucose, UA: NEGATIVE
Ketones, UA: NEGATIVE
Nitrite, UA: NEGATIVE
Protein,UA: NEGATIVE
Specific Gravity, UA: 1.025 (ref 1.005–1.030)
Urobilinogen, Ur: 0.2 mg/dL (ref 0.2–1.0)
pH, UA: 5 (ref 5.0–7.5)

## 2019-12-23 MED ORDER — TRAMADOL HCL 50 MG PO TABS: ORAL_TABLET | ORAL | 2 refills | Status: DC

## 2019-12-23 MED ORDER — CEPHALEXIN 500 MG PO CAPS: 500.0000 mg | ORAL_CAPSULE | Freq: Two times a day (BID) | ORAL | 0 refills | Status: DC

## 2019-12-23 NOTE — Progress Notes (Signed)
Subjective:    Patient ID: Caitlin Mccarthy, female    DOB: 07/25/1935, 84 y.o.   MRN: [id number]   Chief Complaint: Pain Management   HPI Patient comes in today for pain management: Pain assessment: Cause of pain- osteoarthritis Pain location- bil knee Pain on scale of 1-10- 8-10/10 Frequency- daily What increases pain-walking and standing increases pain What makes pain Better-sitting or laying Effects on ADL - none Any change in general medical condition-none  Current opioids rx- tramadol  # meds rx- #60 Effectiveness of current meds-helps relieve pain Adverse reactions form pain meds-none Morphine equivalent- no morphine equvilant on this rx  Pill count performed-No Last drug screen - 1/21 ( high risk q48m, moderate risk q74m, low risk yearly ) Urine drug screen today- No Was the [place] reviewed- yes  If yes were their any concerning findings? - no   Overdose risk: 1  Pain contract signed on:07/23/19  * had night sweat last nightt- wants urine checked to make sure is not getting UTI.   Review of Systems  Constitutional: Negative for diaphoresis.  Eyes: Negative for pain.  Respiratory: Negative for shortness of breath.   Cardiovascular: Negative for chest pain, palpitations and leg swelling.  Gastrointestinal: Negative for abdominal pain.  Endocrine: Negative for polydipsia.  Skin: Negative for rash.  Neurological: Negative for dizziness, weakness and headaches.  Hematological: Does not bruise/bleed easily.  All other systems reviewed and are negative.      Objective:   Physical Exam Vitals and nursing note reviewed.  Constitutional:      Appearance: Normal appearance. She is obese.  Cardiovascular:     Rate and Rhythm: Normal rate and regular rhythm.     Heart sounds: Normal heart sounds.  Pulmonary:     Breath sounds: Normal breath sounds.  Musculoskeletal:     Comments: Walking with walker Pain bil knees No effusion  Skin:    General: Skin is  warm.  Neurological:     General: No focal deficit present.     Mental Status: She is alert and oriented to person, place, and time.  Psychiatric:        Mood and Affect: Mood normal.        Behavior: Behavior normal.    BP 138/80   Pulse 95   Temp (!) 97.1 F (36.2 C) (Temporal)   Resp 20   Ht 5\' 10"  (1.778 m)   Wt 202 lb (91.6 kg)   SpO2 90%   BMI 28.98 kg/m         Assessment & Plan:  Caitlin Mccarthy in today with chief complaint of Pain Management   1. Dysuria - Urinalysis, Complete  2. Acute cystitis without hematuria Take medication as prescribe Cotton underwear Take shower not bath Cranberry juice, yogurt Force fluids AZO over the counter X2 days Culture pending RTO prn  - cephALEXin (KEFLEX) 500 MG capsule; Take 1 capsule (500 mg total) by mouth 2 (two) times daily.  Dispense: 14 capsule; Refill: 0 - Urine Culture  3. Primary osteoarthritis involving multiple joints rest  4. Pain management contract signed - traMADol (ULTRAM) 50 MG tablet; TAKE ONE TABLET FOUR TIMES DAILY  Dispense: 60 tablet; Refill: 2  5. Primary osteoarthritis of left hip - traMADol (ULTRAM) 50 MG tablet; TAKE ONE TABLET FOUR TIMES DAILY  Dispense: 60 tablet; Refill: 2    The above assessment and management plan was discussed with the patient. The patient verbalized understanding of and has agreed to the  management plan. Patient is aware to call the clinic if symptoms persist or worsen. Patient is aware when to return to the clinic for a follow-up visit. Patient educated on when it is appropriate to go to the emergency department.   Mary-Margaret Hassell Done, FNP

## 2019-12-23 NOTE — Patient Instructions (Signed)
Urinary Tract Infection, Adult A urinary tract infection (UTI) is an infection of any part of the urinary tract. The urinary tract includes:  The kidneys.  The ureters.  The bladder.  The urethra. These organs make, store, and get rid of pee (urine) in the body. What are the causes? This is caused by germs (bacteria) in your genital area. These germs grow and cause swelling (inflammation) of your urinary tract. What increases the risk? You are more likely to develop this condition if:  You have a small, thin tube (catheter) to drain pee.  You cannot control when you pee or poop (incontinence).  You are female, and: ? You use these methods to prevent pregnancy:  A medicine that kills sperm (spermicide).  A device that blocks sperm (diaphragm). ? You have low levels of a female hormone (estrogen). ? You are pregnant.  You have genes that add to your risk.  You are sexually active.  You take antibiotic medicines.  You have trouble peeing because of: ? A prostate that is bigger than normal, if you are female. ? A blockage in the part of your body that drains pee from the bladder (urethra). ? A kidney stone. ? A nerve condition that affects your bladder (neurogenic bladder). ? Not getting enough to drink. ? Not peeing often enough.  You have other conditions, such as: ? Diabetes. ? A weak disease-fighting system (immune system). ? Sickle cell disease. ? Gout. ? Injury of the spine. What are the signs or symptoms? Symptoms of this condition include:  Needing to pee right away (urgently).  Peeing often.  Peeing small amounts often.  Pain or burning when peeing.  Blood in the pee.  Pee that smells bad or not like normal.  Trouble peeing.  Pee that is cloudy.  Fluid coming from the vagina, if you are female.  Pain in the belly or lower back. Other symptoms include:  Throwing up (vomiting).  No urge to eat.  Feeling mixed up (confused).  Being tired  and grouchy (irritable).  A fever.  Watery poop (diarrhea). How is this treated? This condition may be treated with:  Antibiotic medicine.  Other medicines.  Drinking enough water. Follow these instructions at home:  Medicines  Take over-the-counter and prescription medicines only as told by your doctor.  If you were prescribed an antibiotic medicine, take it as told by your doctor. Do not stop taking it even if you start to feel better. General instructions  Make sure you: ? Pee until your bladder is empty. ? Do not hold pee for a long time. ? Empty your bladder after sex. ? Wipe from front to back after pooping if you are a female. Use each tissue one time when you wipe.  Drink enough fluid to keep your pee pale yellow.  Keep all follow-up visits as told by your doctor. This is important. Contact a doctor if:  You do not get better after 1-2 days.  Your symptoms go away and then come back. Get help right away if:  You have very bad back pain.  You have very bad pain in your lower belly.  You have a fever.  You are sick to your stomach (nauseous).  You are throwing up. Summary  A urinary tract infection (UTI) is an infection of any part of the urinary tract.  This condition is caused by germs in your genital area.  There are many risk factors for a UTI. These include having a small, thin   tube to drain pee and not being able to control when you pee or poop.  Treatment includes antibiotic medicines for germs.  Drink enough fluid to keep your pee pale yellow. This information is not intended to replace advice given to you by your health care provider. Make sure you discuss any questions you have with your health care provider. Document Revised: 06/04/2018 Document Reviewed: 12/25/2017 [employer] Patient Education  2020 [employer]  

## 2019-12-25 LAB — URINE CULTURE

## 2020-01-05 ENCOUNTER — Other Ambulatory Visit: Payer: Self-pay | Admitting: *Deleted

## 2020-01-05 DIAGNOSIS — I10 Essential (primary) hypertension: Secondary | ICD-10-CM

## 2020-01-05 MED ORDER — TRUEPLUS LANCETS 33G MISC: 0 refills | Status: AC

## 2020-01-05 MED ORDER — TRUE METRIX AIR GLUCOSE METER W/DEVICE KIT
PACK | 0 refills | Status: AC
Start: 2020-01-05 — End: ?

## 2020-01-05 MED ORDER — AMLODIPINE BESY-BENAZEPRIL HCL 10-40 MG PO CAPS: 1.0000 | ORAL_CAPSULE | Freq: Every day | ORAL | 0 refills | Status: DC

## 2020-01-05 MED ORDER — TRUE METRIX LEVEL 1 LOW VI SOLN
0 refills | Status: AC
Start: 2020-01-05 — End: ?

## 2020-01-05 MED ORDER — BD SWAB SINGLE USE REGULAR PADS: MEDICATED_PAD | 3 refills | Status: AC

## 2020-01-05 MED ORDER — TRUE METRIX BLOOD GLUCOSE TEST VI STRP
ORAL_STRIP | 3 refills | Status: AC
Start: 2020-01-05 — End: ?

## 2020-01-17 ENCOUNTER — Ambulatory Visit (INDEPENDENT_AMBULATORY_CARE_PROVIDER_SITE_OTHER): Payer: Medicare HMO | Admitting: *Deleted

## 2020-01-17 DIAGNOSIS — I63439 Cerebral infarction due to embolism of unspecified posterior cerebral artery: Secondary | ICD-10-CM | POA: Diagnosis not present

## 2020-01-17 DIAGNOSIS — R311 Benign essential microscopic hematuria: Secondary | ICD-10-CM | POA: Diagnosis not present

## 2020-01-17 DIAGNOSIS — N952 Postmenopausal atrophic vaginitis: Secondary | ICD-10-CM | POA: Diagnosis not present

## 2020-01-17 DIAGNOSIS — N3020 Other chronic cystitis without hematuria: Secondary | ICD-10-CM | POA: Diagnosis not present

## 2020-01-17 DIAGNOSIS — N200 Calculus of kidney: Secondary | ICD-10-CM | POA: Diagnosis not present

## 2020-01-17 LAB — CUP PACEART REMOTE DEVICE CHECK
Date Time Interrogation Session: 20210718230510
Implantable Pulse Generator Implant Date: 20181002

## 2020-01-19 DIAGNOSIS — H905 Unspecified sensorineural hearing loss: Secondary | ICD-10-CM | POA: Diagnosis not present

## 2020-01-19 NOTE — Progress Notes (Signed)
Carelink Summary Report / Loop Recorder 

## 2020-01-31 ENCOUNTER — Ambulatory Visit (INDEPENDENT_AMBULATORY_CARE_PROVIDER_SITE_OTHER): Payer: Medicare HMO | Admitting: *Deleted

## 2020-01-31 DIAGNOSIS — Z0000 Encounter for general adult medical examination without abnormal findings: Secondary | ICD-10-CM | POA: Diagnosis not present

## 2020-01-31 NOTE — Progress Notes (Signed)
MEDICARE ANNUAL WELLNESS VISIT  01/31/2020  Telephone Visit Disclaimer This Medicare AWV was conducted by telephone due to national recommendations for restrictions regarding the COVID-19 Pandemic (e.g. social distancing).  I verified, using two identifiers, that I am speaking with Caitlin Mccarthy or their authorized healthcare agent. I discussed the limitations, risks, security, and privacy concerns of performing an evaluation and management service by telephone and the potential availability of an in-person appointment in the future. The patient expressed understanding and agreed to proceed.   Subjective:  Caitlin Mccarthy is a 84 y.o. female patient of [name], [place] who had a Medicare Annual Wellness Visit today via telephone. Caitlin Mccarthy is Retired and lives with their spouse. she has 2 children. she reports that she is not socially active and does interact with friends/family regularly. she is not physically active and enjoys reading.  Patient Care Team: [name], FNP as PCP - General (Family Medicine) Herminio Commons, MD (Inactive) as PCP - Cardiology (Cardiology) Ilean China, RN as Registered Nurse  Advanced Directives 01/31/2020 01/11/2019 04/01/2017 01/09/2017 01/09/2017 04/01/2016 08/15/2015  Does Patient Have a Medical Advance Directive? Yes Yes No No No No No  Type of Advance Directive Living will;Healthcare Power of Attorney Living will - - - - -  Does patient want to make changes to medical advance directive? No - Patient declined No - Patient declined - - - No - Patient declined -  Copy of [place] in Chart? No - copy requested - - - - No - copy requested -  Would patient like information on creating a medical advance directive? - - No - Patient declined No - Patient declined - Yes - Scientist, clinical (histocompatibility and immunogenetics) given Yes - Scientist, clinical (histocompatibility and immunogenetics) given  Pre-existing out of facility DNR order (yellow form or pink MOST form) - - - - - - -     Hospital Utilization Over the Past 12 Months: # of hospitalizations or ER visits: 0 # of surgeries: 0  Review of Systems    Patient reports that her overall health is worse compared to last year.  History obtained from chart review and the patient  Patient Reported Readings (BP, Pulse, CBG, Weight, etc) none  Pain Assessment       Current Medications & Allergies (verified) Allergies as of 01/31/2020      Reactions   Iodinated Diagnostic Agents    Latex       Medication List       Accurate as of January 31, 2020  3:20 PM. If you have any questions, ask your nurse or doctor.        STOP taking these medications   cephALEXin 500 MG capsule Commonly known as: Keflex     TAKE these medications   amLODipine-benazepril 10-40 MG capsule Commonly known as: LOTREL Take 1 capsule by mouth daily.   aspirin EC 81 MG tablet Take 1 tablet (81 mg total) by mouth daily.   B-D SINGLE USE SWABS REGULAR Pads Test BS daily and as needed Dx E11.65   Baclofen 5 MG Tabs Take 5 mg by mouth 3 (three) times daily as needed.   chlorthalidone 25 MG tablet Commonly known as: HYGROTON Take 0.5 tablets (12.5 mg total) by mouth daily.   cholecalciferol 25 MCG (1000 UNIT) tablet Commonly known as: VITAMIN D3 Take 1,000 Units by mouth daily.   DULoxetine 60 MG capsule Commonly known as: Cymbalta Take 1 capsule (60 mg total) by mouth daily.  fenofibrate 145 MG tablet Commonly known as: TRICOR Take 1 tablet (145 mg total) by mouth daily.   fluticasone 50 MCG/ACT nasal spray Commonly known as: FLONASE Place 2 sprays into both nostrils daily.   gabapentin 100 MG capsule Commonly known as: NEURONTIN Take 1 capsule (100 mg total) by mouth 3 (three) times daily.   metFORMIN 500 MG 24 hr tablet Commonly known as: GLUCOPHAGE-XR TAKE 2 TABLETS EVERY MORNING WITH BREAKFAST   nystatin powder Generic drug: nystatin APPLY 4 TIMES DAILY   ondansetron 4 MG disintegrating  tablet Commonly known as: Zofran ODT Take 1 tablet (4 mg total) by mouth every 8 (eight) hours as needed for nausea or vomiting.   polyethylene glycol 17 g packet Commonly known as: MIRALAX / GLYCOLAX Take 17 g by mouth daily.   Premarin vaginal cream Generic drug: conjugated estrogens Apply 1 application topically daily as needed (irritation).   rosuvastatin 40 MG tablet Commonly known as: CRESTOR TAKE ONE (1) TABLET EACH DAY   traMADol 50 MG tablet Commonly known as: ULTRAM TAKE ONE TABLET FOUR TIMES DAILY   True Metrix Air Glucose Meter w/Device Kit Test BS daily and as needed Dx E11.65   True Metrix Blood Glucose Test test strip Generic drug: glucose blood Test BS daily and as needed Dx E11.65   True Metrix Level 1 Low Soln Use with glucose monitor Dx E11.65   TRUEplus Lancets 33G Misc Test BS daily and as needed Dx E11.65       History (reviewed): Past Medical History:  Diagnosis Date  . Anxiety   . Depression   . GERD (gastroesophageal reflux disease)   . History of acute pyelonephritis    last episode 04-13-2015  w/ sepsis  . History of kidney stones   . History of recurrent UTIs    MULTIPLE  . Hyperlipidemia   . Hypertension   . OA (osteoarthritis)    knees, hips, hands  . Osteoporosis   . Poor memory    especially when has uti  . Renal calculus, left   . Stroke ([place])   . Type 2 diabetes mellitus ([place])   . Vitamin D deficiency 05/10/2016   Past Surgical History:  Procedure Laterality Date  . CATARACT EXTRACTION W/PHACO  06/06/2011   Procedure: CATARACT EXTRACTION PHACO AND INTRAOCULAR LENS PLACEMENT (IOC);  Surgeon: Tonny Branch;  Location: [facility];  Service: Ophthalmology;  Laterality: Right;  CDE=12.77  . CATARACT EXTRACTION W/PHACO  06/27/2011   Procedure: CATARACT EXTRACTION PHACO AND INTRAOCULAR LENS PLACEMENT (IOC);  Surgeon: Tonny Branch;  Location: [facility];  Service: Ophthalmology;  Laterality: Left;  CDE:13.96  . CYSTOSCOPY WITH URETEROSCOPY  AND STENT PLACEMENT Right 04/27/2015   Procedure: CYSTOSCOPY WITH RIGHT URETEROSCOPY, BASKET REMOVAL OF STONE, REMOVAL OF RIGHT NEPHROSTOMY TUBE;  Surgeon: Irine Seal, MD;  Location: [facility];  Service: Urology;  Laterality: Right;  . CYSTOSCOPY/URETEROSCOPY/HOLMIUM LASER/STENT PLACEMENT Left 12/24/2016   Procedure: LEFT URETEROSCOPY WITH HOLMIUM LASER AND STENT PLACEMENT;  Surgeon: Irine Seal, MD;  Location: [facility];  Service: Urology;  Laterality: Left;  . EXTRACORPOREAL SHOCK WAVE LITHOTRIPSY  left 04-01-2016;  1980's  . FRACTURE SURGERY    . HIP ARTHROPLASTY    . HOLMIUM LASER APPLICATION Right 09/81/1914   Procedure: HOLMIUM LASER APPLICATION;  Surgeon: Irine Seal, MD;  Location: [facility];  Service: Urology;  Laterality: Right;  . KNEE ARTHROPLASTY    . KNEE ARTHROSCOPY Bilateral right 2005//  left ?  [name] LOOP RECORDER  INSERTION N/A 04/01/2017   Procedure: LOOP RECORDER INSERTION;  Surgeon: Thompson Grayer, MD;  Location: [facility];  Service: Cardiovascular;  Laterality: N/A;  . TOTAL HIP ARTHROPLASTY  10/28/2011   Procedure: TOTAL HIP ARTHROPLASTY;  Surgeon: Gearlean Alf, MD;  Location: [facility];  Service: Orthopedics;  Laterality: Right;  . TOTAL KNEE ARTHROPLASTY Bilateral left 03-09-2007//  right 2006  . TRANSTHORACIC ECHOCARDIOGRAM  10/06/2006   normal echo,  ef 55-60%  . TUBAL LIGATION     Family History  Problem Relation Age of Onset  . Kidney disease Mother   . Diabetes Mother   . Congestive Heart Failure Father   . Heart failure Father   . Heart disease Brother   . Alcohol abuse Brother   . Anesthesia problems Neg Hx   . Hypotension Neg Hx   . Malignant hyperthermia Neg Hx   . Pseudochol deficiency Neg Hx    Social History   Socioeconomic History  . Marital status: Married    Spouse name: Clare Gandy  . Number of children: 2  . Years of education: 73  . Highest education level: Not on file  Occupational History   . Occupation: Retired    Comment: retired  Tobacco Use  . Smoking status: Current Every Day Smoker    Packs/day: 0.25    Years: 42.00    Pack years: 10.50    Types: Cigarettes  . Smokeless tobacco: Never Used  . Tobacco comment: 1 pack last 4 days   Vaping Use  . Vaping Use: Never used  Substance and Sexual Activity  . Alcohol use: No  . Drug use: No  . Sexual activity: Not Currently    Birth control/protection: Post-menopausal  Other Topics Concern  . Not on file  Social History Narrative   Lives with husband   Social Determinants of Health   Financial Resource Strain: Low Risk   . Difficulty of Paying Living Expenses: Not very hard  Food Insecurity: No Food Insecurity  . Worried About Charity fundraiser in the Last Year: Never true  . Ran Out of Food in the Last Year: Never true  Transportation Needs: No Transportation Needs  . Lack of Transportation (Medical): No  . Lack of Transportation (Non-Medical): No  Physical Activity: Inactive  . Days of Exercise per Week: 0 days  . Minutes of Exercise per Session: 0 min  Stress: No Stress Concern Present  . Feeling of Stress : Only a little  Social Connections: Moderately Integrated  . Frequency of Communication with Friends and Family: More than three times a week  . Frequency of Social Gatherings with Friends and Family: More than three times a week  . Attends Religious Services: 1 to 4 times per year  . Active Member of Clubs or Organizations: No  . Attends Archivist Meetings: Never  . Marital Status: Married    Activities of Daily Living In your present state of health, do you have any difficulty performing the following activities: 01/31/2020 03/01/2019  Hearing? Y N  Comment getting new hearing aid this wee, -  Vision? N Y  Comment got new glasses -  Difficulty concentrating or making decisions? N N  Walking or climbing stairs? Y Y  Comment uses walker -  Dressing or bathing? N N  Doing errands,  shopping? Tempie Donning  Comment daughters do -  Conservation officer, nature and eating ? N N  Using the Toilet? N N  In the past six months, have  you accidently leaked urine? N N  Do you have problems with loss of bowel control? N N  Managing your Medications? N Y  Managing your Finances? Tempie Donning  Comment daughters pay -  Housekeeping or managing your Housekeeping? Y Y  Some recent data might be hidden    Patient Education/ Literacy    Exercise Current Exercise Habits: The patient does not participate in regular exercise at present, Exercise limited by: orthopedic condition(s)  Diet Patient reports consuming 3 meals a day and 0 snack(s) a day Patient reports that her primary diet is: Regular Patient reports that she does have regular access to food.   Depression Screen PHQ 2/9 Scores 01/31/2020 12/23/2019 10/25/2019 09/21/2019 07/23/2019 04/06/2019 03/01/2019  PHQ - 2 Score 3 0 5 0 1 0 2  PHQ- 9 Score 6 0 12 - - - 5     Fall Risk Fall Risk  01/31/2020 12/23/2019 10/25/2019 09/21/2019 07/23/2019  Falls in the past year? 0 0 1 1 0  Number falls in past yr: - - 1 1 -  Injury with Fall? - - 1 1 -  Comment - - - - -  Risk for fall due to : - - Impaired balance/gait;Impaired mobility;History of fall(s) History of fall(s);Impaired balance/gait;Impaired mobility -  Risk for fall due to: Comment - - - - -  Follow up - - Falls evaluation completed Falls evaluation completed -     Objective:  Caitlin Mccarthy seemed alert and oriented and she participated appropriately during our telephone visit.  Blood Pressure Weight BMI  BP Readings from Last 3 Encounters:  12/23/19 138/80  12/01/19 128/72  11/09/19 120/60   Wt Readings from Last 3 Encounters:  12/23/19 202 lb (91.6 kg)  12/01/19 201 lb 6 oz (91.3 kg)  11/09/19 202 lb 12.8 oz (92 kg)   BMI Readings from Last 1 Encounters:  12/23/19 28.98 kg/m    *Unable to obtain current vital signs, weight, and BMI due to telephone visit type  Hearing/Vision   . [name] did not seem to have difficulty with hearing/understanding during the telephone conversation . Reports that she has had a formal eye exam by an eye care professional within the past year . Reports that she has had a formal hearing evaluation within the past year *Unable to fully assess hearing and vision during telephone visit type  Cognitive Function: 6CIT Screen 01/31/2020 01/11/2019  What Year? 0 points 0 points  What month? 0 points 0 points  What time? 0 points 0 points  Count back from 20 0 points 0 points  Months in reverse 2 points 0 points  Repeat phrase 2 points 0 points  Total Score 4 0   (Normal:0-7, Significant for Dysfunction: >8)  Normal Cognitive Function Screening: Yes   Immunization & Health Maintenance Record Immunization History  Administered Date(s) Administered  . Fluad Quad(high Dose 65+) 04/06/2019  . Influenza, High Dose Seasonal PF 04/21/2017, 04/06/2018  . Influenza,inj,Quad PF,6+ Mos 04/02/2013, 04/06/2014, 04/14/2015, 04/05/2016  . Influenza,inj,quad, With Preservative 03/31/2017, 03/31/2018  . Pneumococcal Conjugate-13 11/24/2014  . Pneumococcal Polysaccharide-23 06/08/2003, 10/12/2010  . Pneumococcal-Unspecified 03/31/2017  . Td 10/12/2010  . Tdap 10/12/2010    Health Maintenance  Topic Date Due  . COVID-19 Vaccine (1) Never done  . OPHTHALMOLOGY EXAM  05/26/2019  . INFLUENZA VACCINE  01/30/2020  . FOOT EXAM  04/05/2020  . HEMOGLOBIN A1C  04/25/2020  . TETANUS/TDAP  10/11/2020  . DEXA SCAN  Completed  . PNA vac Low  Risk Adult  Completed       Assessment  This is a routine wellness examination for Caitlin Mccarthy.  Health Maintenance: Due or Overdue Health Maintenance Due  Topic Date Due  . COVID-19 Vaccine (1) Never done  . OPHTHALMOLOGY EXAM  05/26/2019  . INFLUENZA VACCINE  01/30/2020    Caitlin Mccarthy does not need a referral for Community Assistance: Care Management:   no Social Work:    no Prescription  Assistance:  no Nutrition/Diabetes Education:  no   Plan:  Personalized Goals Goals Addressed              This Visit's Progress   .  "I don't want to fall" (pt-stated)   On track     Current Barriers:  . Physical deconditioning  . Balance problems  . Broken toes  Nurse Case Manager Clinical Goal(s):  [name] Over the next 30 days, the patient will demonstrate ongoing self health care management ability as evidenced by not having any falls*  Interventions:  . Evaluation of current treatment plan related to fall prevention and patient's adherence to plan as established by provider. . Fall Risk Assessment . Advised patient to continue using fall prevention safety measures around her home . Advised to move carefully and change positions slowly to minimize risk for falls . Encouraged patient to continue using rolling walker with seat . Discussed plans with patient for ongoing care management follow up and provided patient with direct contact information for care management team  Patient Self Care Activities:  . Currently UNABLE TO independently run errands . Calls provider office for new concerns or questions  Initial goal documentation     .  Exercise 150 min/wk Moderate Activity   Not on track     Walk with walker as much as possible      Personalized Health Maintenance & Screening Recommendations  shingles vaccine  Lung Cancer Screening Recommended: no (Low Dose CT Chest recommended if Age 6-80 years, 30 pack-year currently smoking OR have quit w/in past 15 years) Hepatitis C Screening recommended: no HIV Screening recommended: no  Advanced Directives: Written information was not prepared per patient's request.  Referrals & Orders No orders of the defined types were placed in this encounter.   Follow-up Plan . Follow-up with [name], FNP as planned 02/03/20 . Pt had Covid vaccines will call back or bring dates in to office. [name] Pt just had eye exam and  obtained new glasses this month. . Pt just had hearing exam and hearing aids should be here this week.  . Offer shingles vaccine . Pt remains independent in the home, but must be accompanied if running errand. . Pt voices no concerns to this nurse today . Avs printed and mailed    I have personally reviewed and noted the following in the patient's chart:   . Medical and social history . Use of alcohol, tobacco or illicit drugs  . Current medications and supplements . Functional ability and status . Nutritional status . Physical activity . Advanced directives . List of other physicians . Hospitalizations, surgeries, and ER visits in previous 12 months . Vitals . Screenings to include cognitive, depression, and falls . Referrals and appointments  In addition, I have reviewed and discussed with Caitlin Mccarthy certain preventive protocols, quality metrics, and best practice recommendations. A written personalized care plan for preventive services as well as general preventive health recommendations is available and can be mailed to the patient at her  request.      Rana Snare, LPN  [id number]

## 2020-02-03 ENCOUNTER — Ambulatory Visit (INDEPENDENT_AMBULATORY_CARE_PROVIDER_SITE_OTHER): Payer: Medicare HMO | Admitting: Nurse Practitioner

## 2020-02-03 ENCOUNTER — Encounter: Payer: Self-pay | Admitting: Nurse Practitioner

## 2020-02-03 ENCOUNTER — Other Ambulatory Visit: Payer: Self-pay

## 2020-02-03 VITALS — BP 125/68 | HR 78 | Temp 97.5°F | Resp 20 | Ht 70.0 in | Wt 203.0 lb

## 2020-02-03 DIAGNOSIS — K219 Gastro-esophageal reflux disease without esophagitis: Secondary | ICD-10-CM

## 2020-02-03 DIAGNOSIS — E781 Pure hyperglyceridemia: Secondary | ICD-10-CM

## 2020-02-03 DIAGNOSIS — N3281 Overactive bladder: Secondary | ICD-10-CM | POA: Diagnosis not present

## 2020-02-03 DIAGNOSIS — I10 Essential (primary) hypertension: Secondary | ICD-10-CM | POA: Diagnosis not present

## 2020-02-03 DIAGNOSIS — F339 Major depressive disorder, recurrent, unspecified: Secondary | ICD-10-CM | POA: Diagnosis not present

## 2020-02-03 DIAGNOSIS — Z23 Encounter for immunization: Secondary | ICD-10-CM

## 2020-02-03 DIAGNOSIS — M818 Other osteoporosis without current pathological fracture: Secondary | ICD-10-CM

## 2020-02-03 DIAGNOSIS — M1612 Unilateral primary osteoarthritis, left hip: Secondary | ICD-10-CM

## 2020-02-03 DIAGNOSIS — E876 Hypokalemia: Secondary | ICD-10-CM | POA: Diagnosis not present

## 2020-02-03 DIAGNOSIS — E1165 Type 2 diabetes mellitus with hyperglycemia: Secondary | ICD-10-CM | POA: Diagnosis not present

## 2020-02-03 DIAGNOSIS — E559 Vitamin D deficiency, unspecified: Secondary | ICD-10-CM

## 2020-02-03 DIAGNOSIS — J382 Nodules of vocal cords: Secondary | ICD-10-CM

## 2020-02-03 DIAGNOSIS — I6930 Unspecified sequelae of cerebral infarction: Secondary | ICD-10-CM | POA: Diagnosis not present

## 2020-02-03 DIAGNOSIS — F17200 Nicotine dependence, unspecified, uncomplicated: Secondary | ICD-10-CM

## 2020-02-03 DIAGNOSIS — Z0289 Encounter for other administrative examinations: Secondary | ICD-10-CM

## 2020-02-03 DIAGNOSIS — F411 Generalized anxiety disorder: Secondary | ICD-10-CM

## 2020-02-03 DIAGNOSIS — E663 Overweight: Secondary | ICD-10-CM

## 2020-02-03 DIAGNOSIS — G8929 Other chronic pain: Secondary | ICD-10-CM

## 2020-02-03 LAB — CBC WITH DIFFERENTIAL/PLATELET
Basophils Absolute: 0.1 10*3/uL (ref 0.0–0.2)
Basos: 1 %
EOS (ABSOLUTE): 0.2 10*3/uL (ref 0.0–0.4)
Eos: 4 %
Hematocrit: 38.8 % (ref 34.0–46.6)
Hemoglobin: 13.3 g/dL (ref 11.1–15.9)
Immature Grans (Abs): 0 10*3/uL (ref 0.0–0.1)
Immature Granulocytes: 0 %
Lymphocytes Absolute: 2.2 10*3/uL (ref 0.7–3.1)
Lymphs: 42 %
MCH: 31.2 pg (ref 26.6–33.0)
MCHC: 34.3 g/dL (ref 31.5–35.7)
MCV: 91 fL (ref 79–97)
Monocytes Absolute: 0.6 10*3/uL (ref 0.1–0.9)
Monocytes: 11 %
Neutrophils Absolute: 2.2 10*3/uL (ref 1.4–7.0)
Neutrophils: 42 %
Platelets: 233 10*3/uL (ref 150–450)
RBC: 4.26 x10E6/uL (ref 3.77–5.28)
RDW: 12.3 % (ref 11.7–15.4)
WBC: 5.2 10*3/uL (ref 3.4–10.8)

## 2020-02-03 LAB — CMP14+EGFR
ALT: 18 IU/L (ref 0–32)
AST: 29 IU/L (ref 0–40)
Albumin/Globulin Ratio: 1.7 (ref 1.2–2.2)
Albumin: 4.5 g/dL (ref 3.6–4.6)
Alkaline Phosphatase: 54 IU/L (ref 48–121)
BUN/Creatinine Ratio: 24 (ref 12–28)
BUN: 27 mg/dL (ref 8–27)
Bilirubin Total: 0.3 mg/dL (ref 0.0–1.2)
CO2: 23 mmol/L (ref 20–29)
Calcium: 12 mg/dL — ABNORMAL HIGH (ref 8.7–10.3)
Chloride: 99 mmol/L (ref 96–106)
Creatinine, Ser: 1.11 mg/dL — ABNORMAL HIGH (ref 0.57–1.00)
GFR calc Af Amer: 54 mL/min/{1.73_m2} — ABNORMAL LOW (ref >59)
GFR calc non Af Amer: 47 mL/min/{1.73_m2} — ABNORMAL LOW (ref >59)
Globulin, Total: 2.7 g/dL (ref 1.5–4.5)
Glucose: 164 mg/dL — ABNORMAL HIGH (ref 65–99)
Potassium: 4 mmol/L (ref 3.5–5.2)
Sodium: 134 mmol/L (ref 134–144)
Total Protein: 7.2 g/dL (ref 6.0–8.5)

## 2020-02-03 LAB — LIPID PANEL
Chol/HDL Ratio: 4 ratio (ref 0.0–4.4)
Cholesterol, Total: 159 mg/dL (ref 100–199)
HDL: 40 mg/dL (ref >39)
LDL Chol Calc (NIH): 84 mg/dL (ref 0–99)
Triglycerides: 210 mg/dL — ABNORMAL HIGH (ref 0–149)
VLDL Cholesterol Cal: 35 mg/dL (ref 5–40)

## 2020-02-03 LAB — BAYER DCA HB A1C WAIVED: HB A1C (BAYER DCA - WAIVED): 7.1 % — ABNORMAL HIGH (ref <7.0)

## 2020-02-03 MED ORDER — GABAPENTIN 100 MG PO CAPS: 100.0000 mg | ORAL_CAPSULE | Freq: Three times a day (TID) | ORAL | 2 refills | Status: DC

## 2020-02-03 MED ORDER — DULOXETINE HCL 60 MG PO CPEP: 60.0000 mg | ORAL_CAPSULE | Freq: Every day | ORAL | 1 refills | Status: DC

## 2020-02-03 MED ORDER — AMLODIPINE BESY-BENAZEPRIL HCL 10-40 MG PO CAPS: 1.0000 | ORAL_CAPSULE | Freq: Every day | ORAL | 1 refills | Status: DC

## 2020-02-03 MED ORDER — TRAMADOL HCL 50 MG PO TABS: ORAL_TABLET | ORAL | 2 refills | Status: DC

## 2020-02-03 MED ORDER — CHLORTHALIDONE 25 MG PO TABS: 12.5000 mg | ORAL_TABLET | Freq: Every day | ORAL | 1 refills | Status: DC

## 2020-02-03 MED ORDER — FENOFIBRATE 145 MG PO TABS: 145.0000 mg | ORAL_TABLET | Freq: Every day | ORAL | 1 refills | Status: DC

## 2020-02-03 MED ORDER — ROSUVASTATIN CALCIUM 40 MG PO TABS: ORAL_TABLET | ORAL | 1 refills | Status: DC

## 2020-02-03 NOTE — Addendum Note (Signed)
Addended by: Rolena Infante on: 02/03/2020 11:38 AM   Modules accepted: Orders

## 2020-02-03 NOTE — Patient Instructions (Signed)

## 2020-02-03 NOTE — Progress Notes (Signed)
Subjective:    Patient ID: Caitlin Mccarthy, female    DOB: Jan 17, 1936, 84 y.o.   MRN: [id number]   Chief Complaint: medical management of chronic issues     HPI:  1. Essential hypertension, benign No c/o chest pain, sob or headache. Does not check blood pressure at home. BP Readings from Last 3 Encounters:  02/03/20 125/68  12/23/19 138/80  12/01/19 128/72     2. Type 2 diabetes mellitus with hyperglycemia, without long-term current udoes not hse of insulin (HCC) Fasting blood sugars are running around 100-180. She denies any low blood sugar. Lab Results  Component Value Date   HGBA1C 6.7 10/25/2019     3. Pure hypertriglyceridemia Does try to watch diet but is not able to do much exercise. Lab Results  Component Value Date   CHOL 162 07/23/2019   HDL 39 (L) 07/23/2019   LDLCALC 86 07/23/2019   TRIG 220 (H) 07/23/2019   CHOLHDL 4.2 07/23/2019    4. Hypokalemia No c/o lower ext cramping. Lab Results  Component Value Date   K 4.7 10/25/2019     5. Late effect of cerebrovascular accident (CVA) No residual effects other then weakness  6. Gastroesophageal reflux disease without esophagitis Has not been having any reflux symptoms.  7. Vocal cord nodule No change  In voice. No problems swallowing. Patient said just let it go.  8. Overactive bladder Not going as much as she use to.  9. Episode of recurrent major depressive disorder, unspecified depression episode severity ([place]) Is on cymbalta daily and is doing well. Depression screen [facility] 2/9 02/03/2020 01/31/2020 12/23/2019  Decreased Interest 0 1 0  Down, Depressed, Hopeless 0 2 0  PHQ - 2 Score 0 3 0  Altered sleeping - 1 0  Tired, decreased energy - 1 0  Change in appetite - 0 0  Feeling bad or failure about yourself  - 0 0  Trouble concentrating - 1 0  Moving slowly or fidgety/restless - 0 0  Suicidal thoughts - 0 0  PHQ-9 Score - 6 0  Difficult doing work/chores - Not difficult at all Not  difficult at all  Some recent data might be hidden     10. GAD (generalized anxiety disorder) Is doing well. GAD 7 : Generalized Anxiety Score 12/23/2019 10/25/2019  Nervous, Anxious, on Edge 0 1  Control/stop worrying 0 3  Worry too much - different things 0 3  Trouble relaxing 0 3  Restless 0 3  Easily annoyed or irritable 0 2  Afraid - awful might happen 0 3  Total GAD 7 Score 0 18  Anxiety Difficulty Not difficult at all Somewhat difficult      11. Current smoker Has no desire to quit smoking  12. Other chronic pain Has chronic low back pain. Is on ultram 2x a day and is doing well.  13. Vitamin D deficiency Takes daily vitamin d supplement  14. Overweight (BMI 25.0-29.9) No recent eight changes Wt Readings from Last 3 Encounters:  02/03/20 203 lb (92.1 kg)  12/23/19 202 lb (91.6 kg)  12/01/19 201 lb 6 oz (91.3 kg)   BMI Readings from Last 3 Encounters:  02/03/20 29.13 kg/m  12/23/19 28.98 kg/m  12/01/19 28.89 kg/m       Outpatient Encounter Medications as of 02/03/2020  Medication Sig   Alcohol Swabs (B-D SINGLE USE SWABS REGULAR) PADS Test BS daily and as needed Dx E11.65   amLODipine-benazepril (LOTREL) 10-40 MG capsule Take 1 capsule  by mouth daily.   aspirin EC 81 MG tablet Take 1 tablet (81 mg total) by mouth daily.   Baclofen 5 MG TABS Take 5 mg by mouth 3 (three) times daily as needed.   Blood Glucose Calibration (TRUE METRIX LEVEL 1) Low SOLN Use with glucose monitor Dx E11.65   Blood Glucose Monitoring Suppl (TRUE METRIX AIR GLUCOSE METER) w/Device KIT Test BS daily and as needed Dx E11.65   chlorthalidone (HYGROTON) 25 MG tablet Take 0.5 tablets (12.5 mg total) by mouth daily.   cholecalciferol (VITAMIN D3) 25 MCG (1000 UNIT) tablet Take 1,000 Units by mouth daily.   DULoxetine (CYMBALTA) 60 MG capsule Take 1 capsule (60 mg total) by mouth daily.   fenofibrate (TRICOR) 145 MG tablet Take 1 tablet (145 mg total) by mouth daily.    fluticasone (FLONASE) 50 MCG/ACT nasal spray Place 2 sprays into both nostrils daily. (Patient not taking: Reported on 01/31/2020)   gabapentin (NEURONTIN) 100 MG capsule Take 1 capsule (100 mg total) by mouth 3 (three) times daily.   glucose blood (TRUE METRIX BLOOD GLUCOSE TEST) test strip Test BS daily and as needed Dx E11.65   metFORMIN (GLUCOPHAGE-XR) 500 MG 24 hr tablet TAKE 2 TABLETS EVERY MORNING WITH BREAKFAST   NYSTATIN powder APPLY 4 TIMES DAILY   ondansetron (ZOFRAN ODT) 4 MG disintegrating tablet Take 1 tablet (4 mg total) by mouth every 8 (eight) hours as needed for nausea or vomiting.   polyethylene glycol (MIRALAX / GLYCOLAX) 17 g packet Take 17 g by mouth daily.   PREMARIN vaginal cream Apply 1 application topically daily as needed (irritation).    rosuvastatin (CRESTOR) 40 MG tablet TAKE ONE (1) TABLET EACH DAY   traMADol (ULTRAM) 50 MG tablet TAKE ONE TABLET FOUR TIMES DAILY   TRUEplus Lancets 33G MISC Test BS daily and as needed Dx E11.65   No facility-administered encounter medications on file as of 02/03/2020.    Past Surgical History:  Procedure Laterality Date   CATARACT EXTRACTION W/PHACO  06/06/2011   Procedure: CATARACT EXTRACTION PHACO AND INTRAOCULAR LENS PLACEMENT (IOC);  Surgeon: Tonny Branch;  Location: [facility];  Service: Ophthalmology;  Laterality: Right;  CDE=12.77   CATARACT EXTRACTION W/PHACO  06/27/2011   Procedure: CATARACT EXTRACTION PHACO AND INTRAOCULAR LENS PLACEMENT (IOC);  Surgeon: Tonny Branch;  Location: [facility];  Service: Ophthalmology;  Laterality: Left;  CDE:13.96   CYSTOSCOPY WITH URETEROSCOPY AND STENT PLACEMENT Right 04/27/2015   Procedure: CYSTOSCOPY WITH RIGHT URETEROSCOPY, BASKET REMOVAL OF STONE, REMOVAL OF RIGHT NEPHROSTOMY TUBE;  Surgeon: Irine Seal, MD;  Location: [facility];  Service: Urology;  Laterality: Right;   CYSTOSCOPY/URETEROSCOPY/HOLMIUM LASER/STENT PLACEMENT Left 12/24/2016   Procedure: LEFT URETEROSCOPY  WITH HOLMIUM LASER AND STENT PLACEMENT;  Surgeon: Irine Seal, MD;  Location: [facility];  Service: Urology;  Laterality: Left;   EXTRACORPOREAL SHOCK WAVE LITHOTRIPSY  left 04-01-2016;  1980's   FRACTURE SURGERY     HIP ARTHROPLASTY     HOLMIUM LASER APPLICATION Right 18/56/3149   Procedure: HOLMIUM LASER APPLICATION;  Surgeon: Irine Seal, MD;  Location: [facility];  Service: Urology;  Laterality: Right;   KNEE ARTHROPLASTY     KNEE ARTHROSCOPY Bilateral right 2005//  left ?   LOOP RECORDER INSERTION N/A 04/01/2017   Procedure: LOOP RECORDER INSERTION;  Surgeon: Thompson Grayer, MD;  Location: [place] [facility];  Service: Cardiovascular;  Laterality: N/A;   TOTAL HIP ARTHROPLASTY  10/28/2011   Procedure: TOTAL HIP ARTHROPLASTY;  Surgeon: Gearlean Alf, MD;  Location: [facility];  Service: Orthopedics;  Laterality: Right;   TOTAL KNEE ARTHROPLASTY Bilateral left 03-09-2007//  right 2006   TRANSTHORACIC ECHOCARDIOGRAM  10/06/2006   normal echo,  ef 55-60%   TUBAL LIGATION      Family History  Problem Relation Age of Onset   Kidney disease Mother    Diabetes Mother    Congestive Heart Failure Father    Heart failure Father    Heart disease Brother    Alcohol abuse Brother    Anesthesia problems Neg Hx    Hypotension Neg Hx    Malignant hyperthermia Neg Hx    Pseudochol deficiency Neg Hx     New complaints: None today  Social history: Lives with her husband. Her daughter helps her daily  Controlled substance contract: n/a    Review of Systems  Constitutional: Negative for diaphoresis.  Eyes: Negative for pain.  Respiratory: Negative for shortness of breath.   Cardiovascular: Negative for chest pain, palpitations and leg swelling.  Gastrointestinal: Negative for abdominal pain.  Endocrine: Negative for polydipsia.  Skin: Negative for rash.  Neurological: Negative for dizziness, weakness and headaches.  Hematological: Does  not bruise/bleed easily.  All other systems reviewed and are negative.      Objective:   Physical Exam Vitals and nursing note reviewed.  Constitutional:      General: She is not in acute distress.    Appearance: Normal appearance. She is well-developed.  HENT:     Head: Normocephalic.     Nose: Nose normal.  Eyes:     Pupils: Pupils are equal, round, and reactive to light.  Neck:     Vascular: No carotid bruit or JVD.  Cardiovascular:     Rate and Rhythm: Normal rate and regular rhythm.     Heart sounds: Normal heart sounds.  Pulmonary:     Effort: Pulmonary effort is normal. No respiratory distress.     Breath sounds: Normal breath sounds. No wheezing or rales.  Chest:     Chest wall: No tenderness.  Abdominal:     General: Bowel sounds are normal. There is no distension or abdominal bruit.     Palpations: Abdomen is soft. There is no hepatomegaly, splenomegaly, mass or pulsatile mass.     Tenderness: There is no abdominal tenderness.  Musculoskeletal:        General: Normal range of motion.     Cervical back: Normal range of motion and neck supple.  Lymphadenopathy:     Cervical: No cervical adenopathy.  Skin:    General: Skin is warm and dry.  Neurological:     Mental Status: She is alert and oriented to person, place, and time.     Deep Tendon Reflexes: Reflexes are normal and symmetric.  Psychiatric:        Behavior: Behavior normal.        Thought Content: Thought content normal.        Judgment: Judgment normal.    BP 125/68    Pulse 78    Temp (!) 97.5 F (36.4 C) (Temporal)    Resp 20    Ht '5\' 10"'  (1.778 m)    Wt 203 lb (92.1 kg)    SpO2 92%    BMI 29.13 kg/m   HGBA1c 7.1%       Assessment & Plan:  EDANA AGUADO comes in today with chief complaint of Medical Management of Chronic Issues   Diagnosis and orders addressed:  1. Essential hypertension, benign Low sodium diet - CBC with Differential/Platelet - CMP14+EGFR - amLODipine-benazepril  (LOTREL) 10-40 MG capsule; Take 1 capsule by mouth daily.  Dispense: 90 capsule; Refill: 1 - chlorthalidone (HYGROTON) 25 MG tablet; Take 0.5 tablets (12.5 mg total) by mouth daily.  Dispense: 90 tablet; Refill: 1  2. Type 2 diabetes mellitus with hyperglycemia, without long-term current use of insulin (HCC) Continue to watch carbs in diet - Bayer DCA Hb A1c Waived  3. Pure hypertriglyceridemia Low fat diet - Lipid panel - fenofibrate (TRICOR) 145 MG tablet; Take 1 tablet (145 mg total) by mouth daily.  Dispense: 90 tablet; Refill: 1 - rosuvastatin (CRESTOR) 40 MG tablet; TAKE ONE (1) TABLET EACH DAY  Dispense: 90 tablet; Refill: 1  4. Hypokalemia  5. Late effect of cerebrovascular accident (CVA) Fall prevention  6. Gastroesophageal reflux disease without esophagitis Avoid spicy foods Do not eat 2 hours prior to bedtime  7. Vocal cord nodule Patient doe snot want top have rechecked  8. Overactive bladder  9. Episode of recurrent major depressive disorder, unspecified depression episode severity ([place]) Stress management - DULoxetine (CYMBALTA) 60 MG capsule; Take 1 capsule (60 mg total) by mouth daily.  Dispense: 90 capsule; Refill: 1  10. GAD (generalized anxiety disorder) - DULoxetine (CYMBALTA) 60 MG capsule; Take 1 capsule (60 mg total) by mouth daily.  Dispense: 90 capsule; Refill: 1  11. Current smoker Smoking cessation  12. Other chronic pain  13. Vitamin D deficiency Continue daily vitamin d supplement  14. Overweight (BMI 25.0-29.9) Discussed diet and exercise for person with BMI >25 Will recheck weight in 3-6 months  15. Other osteoporosis without current pathological fracture - gabapentin (NEURONTIN) 100 MG capsule; Take 1 capsule (100 mg total) by mouth 3 (three) times daily.  Dispense: 90 capsule; Refill: 2  16. Pain management contract signed - traMADol (ULTRAM) 50 MG tablet; TAKE ONE TABLET FOUR TIMES DAILY  Dispense: 60 tablet; Refill: 2  17. Primary  osteoarthritis of left hip - traMADol (ULTRAM) 50 MG tablet; TAKE ONE TABLET FOUR TIMES DAILY  Dispense: 60 tablet; Refill: 2   Labs pending Health Maintenance reviewed Diet and exercise encouraged  Follow up plan: 3 months   Mary-Margaret Hassell Done, FNP

## 2020-02-07 DIAGNOSIS — H905 Unspecified sensorineural hearing loss: Secondary | ICD-10-CM | POA: Diagnosis not present

## 2020-02-20 LAB — CUP PACEART REMOTE DEVICE CHECK
Date Time Interrogation Session: 20210818232721
Implantable Pulse Generator Implant Date: 20181002

## 2020-02-21 ENCOUNTER — Ambulatory Visit (INDEPENDENT_AMBULATORY_CARE_PROVIDER_SITE_OTHER): Payer: Medicare HMO | Admitting: *Deleted

## 2020-02-21 DIAGNOSIS — I63419 Cerebral infarction due to embolism of unspecified middle cerebral artery: Secondary | ICD-10-CM

## 2020-02-25 NOTE — Progress Notes (Signed)
Carelink Summary Report / Loop Recorder 

## 2020-03-22 NOTE — Progress Notes (Signed)
Patient: home Provider or nurse: office

## 2020-03-23 ENCOUNTER — Other Ambulatory Visit: Payer: Self-pay

## 2020-03-23 ENCOUNTER — Encounter (INDEPENDENT_AMBULATORY_CARE_PROVIDER_SITE_OTHER): Payer: Medicare HMO | Admitting: Nurse Practitioner

## 2020-03-23 DIAGNOSIS — Z23 Encounter for immunization: Secondary | ICD-10-CM | POA: Diagnosis not present

## 2020-03-27 ENCOUNTER — Ambulatory Visit (INDEPENDENT_AMBULATORY_CARE_PROVIDER_SITE_OTHER): Payer: Medicare HMO | Admitting: Emergency Medicine

## 2020-03-27 DIAGNOSIS — I63419 Cerebral infarction due to embolism of unspecified middle cerebral artery: Secondary | ICD-10-CM | POA: Diagnosis not present

## 2020-03-27 NOTE — Chronic Care Management (AMB) (Signed)
  Chronic Care Management   Outreach Note  05/13/2019 Name: Caitlin Mccarthy MRN: [id number] DOB: 1936/03/13  Referred by: [name], FNP Reason for referral : Chronic Care Management (RN Follow-up)   An unsuccessful telephone follow-up was attempted today. The patient was referred to the case management team for assistance with care management and care coordination.   Follow Up Plan: A HIPAA compliant phone message was left for the patient providing contact information and requesting a return call.   Chong Sicilian, BSN, RN-BC Embedded Chronic Care Manager [facility] / [place] [facility] Management Direct Dial: [phone]

## 2020-03-28 LAB — CUP PACEART REMOTE DEVICE CHECK
Date Time Interrogation Session: 20210918232248
Implantable Pulse Generator Implant Date: 20181002

## 2020-03-29 NOTE — Progress Notes (Signed)
Carelink Summary Report / Loop Recorder 

## 2020-04-19 ENCOUNTER — Ambulatory Visit (INDEPENDENT_AMBULATORY_CARE_PROVIDER_SITE_OTHER): Payer: Medicare HMO

## 2020-04-19 DIAGNOSIS — I63419 Cerebral infarction due to embolism of unspecified middle cerebral artery: Secondary | ICD-10-CM | POA: Diagnosis not present

## 2020-04-20 LAB — CUP PACEART REMOTE DEVICE CHECK
Date Time Interrogation Session: 20211019233246
Implantable Pulse Generator Implant Date: 20181002

## 2020-04-22 ENCOUNTER — Other Ambulatory Visit: Payer: Self-pay | Admitting: Family

## 2020-04-22 DIAGNOSIS — R110 Nausea: Secondary | ICD-10-CM

## 2020-04-25 NOTE — Progress Notes (Signed)
Carelink Summary Report / Loop Recorder 

## 2020-05-08 ENCOUNTER — Other Ambulatory Visit: Payer: Self-pay

## 2020-05-08 ENCOUNTER — Encounter: Payer: Self-pay | Admitting: Nurse Practitioner

## 2020-05-08 ENCOUNTER — Ambulatory Visit (INDEPENDENT_AMBULATORY_CARE_PROVIDER_SITE_OTHER): Payer: Medicare HMO | Admitting: Nurse Practitioner

## 2020-05-08 ENCOUNTER — Ambulatory Visit: Payer: Medicare HMO

## 2020-05-08 VITALS — BP 116/62 | HR 71 | Temp 97.1°F | Resp 20 | Ht 70.0 in | Wt 196.0 lb

## 2020-05-08 DIAGNOSIS — N3281 Overactive bladder: Secondary | ICD-10-CM

## 2020-05-08 DIAGNOSIS — Z780 Asymptomatic menopausal state: Secondary | ICD-10-CM

## 2020-05-08 DIAGNOSIS — E1165 Type 2 diabetes mellitus with hyperglycemia: Secondary | ICD-10-CM | POA: Diagnosis not present

## 2020-05-08 DIAGNOSIS — M1612 Unilateral primary osteoarthritis, left hip: Secondary | ICD-10-CM

## 2020-05-08 DIAGNOSIS — E781 Pure hyperglyceridemia: Secondary | ICD-10-CM

## 2020-05-08 DIAGNOSIS — F339 Major depressive disorder, recurrent, unspecified: Secondary | ICD-10-CM | POA: Diagnosis not present

## 2020-05-08 DIAGNOSIS — M818 Other osteoporosis without current pathological fracture: Secondary | ICD-10-CM

## 2020-05-08 DIAGNOSIS — I10 Essential (primary) hypertension: Secondary | ICD-10-CM | POA: Diagnosis not present

## 2020-05-08 DIAGNOSIS — E663 Overweight: Secondary | ICD-10-CM

## 2020-05-08 DIAGNOSIS — E2839 Other primary ovarian failure: Secondary | ICD-10-CM

## 2020-05-08 DIAGNOSIS — G8929 Other chronic pain: Secondary | ICD-10-CM

## 2020-05-08 DIAGNOSIS — F411 Generalized anxiety disorder: Secondary | ICD-10-CM

## 2020-05-08 DIAGNOSIS — Z0289 Encounter for other administrative examinations: Secondary | ICD-10-CM

## 2020-05-08 DIAGNOSIS — K219 Gastro-esophageal reflux disease without esophagitis: Secondary | ICD-10-CM

## 2020-05-08 DIAGNOSIS — E876 Hypokalemia: Secondary | ICD-10-CM

## 2020-05-08 DIAGNOSIS — I6930 Unspecified sequelae of cerebral infarction: Secondary | ICD-10-CM

## 2020-05-08 LAB — BAYER DCA HB A1C WAIVED: HB A1C (BAYER DCA - WAIVED): 5.9 % (ref <7.0)

## 2020-05-08 MED ORDER — TRAMADOL HCL 50 MG PO TABS: ORAL_TABLET | ORAL | 2 refills | Status: DC

## 2020-05-08 NOTE — Patient Instructions (Signed)

## 2020-05-08 NOTE — Progress Notes (Signed)
Subjective:    Patient ID: Caitlin Mccarthy, female    DOB: May 01, 1936, 84 y.o.   MRN: [id number]   Chief Complaint: Medical Management of Chronic Issues    HPI:  1. Essential hypertension, benign No c/o chest pain, sob or headache. Does not check blood pressure at home. BP Readings from Last 3 Encounters:  05/08/20 116/62  02/03/20 125/68  12/23/19 138/80     2. Type 2 diabetes mellitus with hyperglycemia, without long-term current use of insulin (HCC) Fasting blood sugars was 150 this morning. She does not check everyday. denoes any low readings. Lab Results  Component Value Date   HGBA1C 7.1 (H) 02/03/2020     3. Pure hypertriglyceridemia Does not really watch diet and does little exercise due to balance Lab Results  Component Value Date   CHOL 159 02/03/2020   HDL 40 02/03/2020   LDLCALC 84 02/03/2020   TRIG 210 (H) 02/03/2020   CHOLHDL 4.0 02/03/2020    4. Hypokalemia No c/o lower ext cramping Lab Results  Component Value Date   K 4.0 02/03/2020     5. Gastroesophageal reflux disease without esophagitis No recent symptoms  6. Other osteoporosis without current pathological fracture Has chronic low back pain. Last dexascan was done today and we will review results when we get them  7. Overactive bladder hse uses premarin cream for vaginal wall, but she stills pee frequently  8. Episode of recurrent major depressive disorder, unspecified depression episode severity ([place]) Is on cymbalta and is doing well. Depression screen [facility] 2/9 05/08/2020 02/03/2020 01/31/2020  Decreased Interest 0 0 1  Down, Depressed, Hopeless 0 0 2  PHQ - 2 Score 0 0 3  Altered sleeping 1 - 1  Tired, decreased energy 1 - 1  Change in appetite 0 - 0  Feeling bad or failure about yourself  0 - 0  Trouble concentrating 1 - 1  Moving slowly or fidgety/restless 0 - 0  Suicidal thoughts 0 - 0  PHQ-9 Score 3 - 6  Difficult doing work/chores Not difficult at all - Not difficult at  all  Some recent data might be hidden     9. GAD (generalized anxiety disorder) Being cooped up in house causes anxiety. GAD 7 : Generalized Anxiety Score 05/08/2020 12/23/2019 10/25/2019  Nervous, Anxious, on Edge 0 0 1  Control/stop worrying 0 0 3  Worry too much - different things 0 0 3  Trouble relaxing 0 0 3  Restless 0 0 3  Easily annoyed or irritable 0 0 2  Afraid - awful might happen 0 0 3  Total GAD 7 Score 0 0 18  Anxiety Difficulty Not difficult at all Not difficult at all Somewhat difficult      10. Late effect of cerebrovascular accident (CVA) Has weak lower ext but is bilaterally. No actual permanent affects from CVA. She walks with a walker due to lower ext weakness.  11. Other chronic pain She takes tramadol daily for pain. They help make her pain tolerable. Rates pain 9/10 currently.   12. Overweight (BMI 25.0-29.9) Weight is down 7lbs  Wt Readings from Last 3 Encounters:  05/08/20 196 lb (88.9 kg)  02/03/20 203 lb (92.1 kg)  12/23/19 202 lb (91.6 kg)   BMI Readings from Last 3 Encounters:  05/08/20 28.12 kg/m  02/03/20 29.13 kg/m  12/23/19 28.98 kg/m     Outpatient Encounter Medications as of 05/08/2020  Medication Sig   Alcohol Swabs (B-D SINGLE USE SWABS REGULAR)  PADS Test BS daily and as needed Dx E11.65   amLODipine-benazepril (LOTREL) 10-40 MG capsule Take 1 capsule by mouth daily.   aspirin EC 81 MG tablet Take 1 tablet (81 mg total) by mouth daily.   Baclofen 5 MG TABS Take 5 mg by mouth 3 (three) times daily as needed.   Blood Glucose Calibration (TRUE METRIX LEVEL 1) Low SOLN Use with glucose monitor Dx E11.65   Blood Glucose Monitoring Suppl (TRUE METRIX AIR GLUCOSE METER) w/Device KIT Test BS daily and as needed Dx E11.65   chlorthalidone (HYGROTON) 25 MG tablet Take 0.5 tablets (12.5 mg total) by mouth daily.   cholecalciferol (VITAMIN D3) 25 MCG (1000 UNIT) tablet Take 1,000 Units by mouth daily.   DULoxetine (CYMBALTA) 60 MG  capsule Take 1 capsule (60 mg total) by mouth daily.   fenofibrate (TRICOR) 145 MG tablet Take 1 tablet (145 mg total) by mouth daily.   fluticasone (FLONASE) 50 MCG/ACT nasal spray Place 2 sprays into both nostrils daily.   gabapentin (NEURONTIN) 100 MG capsule Take 1 capsule (100 mg total) by mouth 3 (three) times daily.   glucose blood (TRUE METRIX BLOOD GLUCOSE TEST) test strip Test BS daily and as needed Dx E11.65   metFORMIN (GLUCOPHAGE-XR) 500 MG 24 hr tablet TAKE 2 TABLETS EVERY MORNING WITH BREAKFAST   NYSTATIN powder APPLY 4 TIMES DAILY   ondansetron (ZOFRAN-ODT) 4 MG disintegrating tablet TAKE 1 TABLET EVERY 8 HOURS AS NEEDED FOR NAUSEA AND VOMITING   polyethylene glycol (MIRALAX / GLYCOLAX) 17 g packet Take 17 g by mouth daily.   PREMARIN vaginal cream Apply 1 application topically daily as needed (irritation).    rosuvastatin (CRESTOR) 40 MG tablet TAKE ONE (1) TABLET EACH DAY   traMADol (ULTRAM) 50 MG tablet TAKE ONE TABLET FOUR TIMES DAILY   TRUEplus Lancets 33G MISC Test BS daily and as needed Dx E11.65   No facility-administered encounter medications on file as of 05/08/2020.    Past Surgical History:  Procedure Laterality Date   CATARACT EXTRACTION W/PHACO  06/06/2011   Procedure: CATARACT EXTRACTION PHACO AND INTRAOCULAR LENS PLACEMENT (IOC);  Surgeon: Tonny Branch;  Location: [facility];  Service: Ophthalmology;  Laterality: Right;  CDE=12.77   CATARACT EXTRACTION W/PHACO  06/27/2011   Procedure: CATARACT EXTRACTION PHACO AND INTRAOCULAR LENS PLACEMENT (IOC);  Surgeon: Tonny Branch;  Location: [facility];  Service: Ophthalmology;  Laterality: Left;  CDE:13.96   CYSTOSCOPY WITH URETEROSCOPY AND STENT PLACEMENT Right 04/27/2015   Procedure: CYSTOSCOPY WITH RIGHT URETEROSCOPY, BASKET REMOVAL OF STONE, REMOVAL OF RIGHT NEPHROSTOMY TUBE;  Surgeon: Irine Seal, MD;  Location: [place];  Service: Urology;  Laterality: Right;   CYSTOSCOPY/URETEROSCOPY/HOLMIUM  LASER/STENT PLACEMENT Left 12/24/2016   Procedure: LEFT URETEROSCOPY WITH HOLMIUM LASER AND STENT PLACEMENT;  Surgeon: Irine Seal, MD;  Location: [facility];  Service: Urology;  Laterality: Left;   EXTRACORPOREAL SHOCK WAVE LITHOTRIPSY  left 04-01-2016;  1980's   FRACTURE SURGERY     HIP ARTHROPLASTY     HOLMIUM LASER APPLICATION Right 47/65/4650   Procedure: HOLMIUM LASER APPLICATION;  Surgeon: Irine Seal, MD;  Location: [facility];  Service: Urology;  Laterality: Right;   KNEE ARTHROPLASTY     KNEE ARTHROSCOPY Bilateral right 2005//  left ?   LOOP RECORDER INSERTION N/A 04/01/2017   Procedure: LOOP RECORDER INSERTION;  Surgeon: Thompson Grayer, MD;  Location: [place] [facility];  Service: Cardiovascular;  Laterality: N/A;   TOTAL HIP ARTHROPLASTY  10/28/2011  Procedure: TOTAL HIP ARTHROPLASTY;  Surgeon: Gearlean Alf, MD;  Location: [facility];  Service: Orthopedics;  Laterality: Right;   TOTAL KNEE ARTHROPLASTY Bilateral left 03-09-2007//  right 2006   TRANSTHORACIC ECHOCARDIOGRAM  10/06/2006   normal echo,  ef 55-60%   TUBAL LIGATION      Family History  Problem Relation Age of Onset   Kidney disease Mother    Diabetes Mother    Congestive Heart Failure Father    Heart failure Father    Heart disease Brother    Alcohol abuse Brother    Anesthesia problems Neg Hx    Hypotension Neg Hx    Malignant hyperthermia Neg Hx    Pseudochol deficiency Neg Hx     New complaints: None today  Social history: Lives with husband. Her daughters check on her daily  Controlled substance contract: 07/27/19    Review of Systems  Constitutional: Negative for diaphoresis.  Eyes: Negative for pain.  Respiratory: Negative for shortness of breath.   Cardiovascular: Negative for chest pain, palpitations and leg swelling.  Gastrointestinal: Negative for abdominal pain.  Endocrine: Negative for polydipsia.  Skin: Negative for rash.   Neurological: Negative for dizziness, weakness and headaches.  Hematological: Does not bruise/bleed easily.  All other systems reviewed and are negative.      Objective:   Physical Exam Vitals and nursing note reviewed.  Constitutional:      General: She is not in acute distress.    Appearance: Normal appearance. She is well-developed.  HENT:     Head: Normocephalic.     Nose: Nose normal.  Eyes:     Pupils: Pupils are equal, round, and reactive to light.  Neck:     Vascular: No carotid bruit or JVD.  Cardiovascular:     Rate and Rhythm: Normal rate and regular rhythm.     Heart sounds: Normal heart sounds.  Pulmonary:     Effort: Pulmonary effort is normal. No respiratory distress.     Breath sounds: Normal breath sounds. No wheezing or rales.  Chest:     Chest wall: No tenderness.  Abdominal:     General: Bowel sounds are normal. There is no distension or abdominal bruit.     Palpations: Abdomen is soft. There is no hepatomegaly, splenomegaly, mass or pulsatile mass.     Tenderness: There is no abdominal tenderness.  Musculoskeletal:        General: Normal range of motion.     Cervical back: Normal range of motion and neck supple.  Lymphadenopathy:     Cervical: No cervical adenopathy.  Skin:    General: Skin is warm and dry.  Neurological:     Mental Status: She is alert and oriented to person, place, and time.     Deep Tendon Reflexes: Reflexes are normal and symmetric.  Psychiatric:        Behavior: Behavior normal.        Thought Content: Thought content normal.        Judgment: Judgment normal.    BP 116/62    Pulse 71    Temp (!) 97.1 F (36.2 C) (Temporal)    Resp 20    Ht _0  (1.778 m)    Wt 196 lb (88.9 kg)    SpO2 96%    BMI 28.12 kg/m    hgba1c 5.9%     Assessment & Plan:  CHERRELLE PLANTE comes in today with chief complaint of Medical Management of Chronic Issues  Diagnosis and orders addressed:  1. Essential hypertension, benign Low  s[place]oum diet - Bayer DCA Hb A1c Waived - Microalbumin / creatinine urine ratio  2. Type 2 diabetes mellitus with hyperglycemia, without long-term current use of insulin (HCC) Continue to watch carbs in diet - CBC with Differential/Platelet - CMP14+EGFR  3. Pure hypertriglyceridemia Low fat diet - Lipid panel  4. Hypokalemia Labs pending  5. Gastroesophageal reflux disease without esophagitis Avoid spicy foods Do not eat 2 hours prior to bedtime  6. Other osteoporosis without current pathological fracture Patient refuses dexascan  7. Overactive bladder Limit fluids after 7pm  8. Episode of recurrent major depressive disorder, unspecified depression episode severity ([place]) stress management  9. GAD (generalized anxiety disorder) Stress management  10. Late effect of cerebrovascular accident (CVA) Fall prevention  62. Other chronic pain Refilled ultram today  12. Overweight (BMI 25.0-29.9) Discussed diet and exercise for person with BMI >25 Will recheck weight in 3-6 months    Labs pending Health Maintenance reviewed Diet and exercise encouraged  Follow up plan: 3 months   Mary-Margaret Hassell Done, FNP

## 2020-05-09 LAB — CBC WITH DIFFERENTIAL/PLATELET
Basophils Absolute: 0.1 10*3/uL (ref 0.0–0.2)
Basos: 1 %
EOS (ABSOLUTE): 0.1 10*3/uL (ref 0.0–0.4)
Eos: 2 %
Hematocrit: 41.4 % (ref 34.0–46.6)
Hemoglobin: 13.8 g/dL (ref 11.1–15.9)
Immature Grans (Abs): 0 10*3/uL (ref 0.0–0.1)
Immature Granulocytes: 0 %
Lymphocytes Absolute: 3.2 10*3/uL — ABNORMAL HIGH (ref 0.7–3.1)
Lymphs: 49 %
MCH: 31.9 pg (ref 26.6–33.0)
MCHC: 33.3 g/dL (ref 31.5–35.7)
MCV: 96 fL (ref 79–97)
Monocytes Absolute: 0.8 10*3/uL (ref 0.1–0.9)
Monocytes: 12 %
Neutrophils Absolute: 2.4 10*3/uL (ref 1.4–7.0)
Neutrophils: 36 %
Platelets: 240 10*3/uL (ref 150–450)
RBC: 4.33 x10E6/uL (ref 3.77–5.28)
RDW: 12.9 % (ref 11.7–15.4)
WBC: 6.6 10*3/uL (ref 3.4–10.8)

## 2020-05-09 LAB — CMP14+EGFR
ALT: 17 IU/L (ref 0–32)
AST: 28 IU/L (ref 0–40)
Albumin/Globulin Ratio: 1.5 (ref 1.2–2.2)
Albumin: 4.7 g/dL — ABNORMAL HIGH (ref 3.6–4.6)
Alkaline Phosphatase: 57 IU/L (ref 44–121)
BUN/Creatinine Ratio: 20 (ref 12–28)
BUN: 20 mg/dL (ref 8–27)
Bilirubin Total: 0.3 mg/dL (ref 0.0–1.2)
CO2: 20 mmol/L (ref 20–29)
Calcium: 12 mg/dL — ABNORMAL HIGH (ref 8.7–10.3)
Chloride: 101 mmol/L (ref 96–106)
Creatinine, Ser: 1 mg/dL (ref 0.57–1.00)
GFR calc Af Amer: 61 mL/min/{1.73_m2} (ref >59)
GFR calc non Af Amer: 53 mL/min/{1.73_m2} — ABNORMAL LOW (ref >59)
Globulin, Total: 3.1 g/dL (ref 1.5–4.5)
Glucose: 111 mg/dL — ABNORMAL HIGH (ref 65–99)
Potassium: 4.7 mmol/L (ref 3.5–5.2)
Sodium: 139 mmol/L (ref 134–144)
Total Protein: 7.8 g/dL (ref 6.0–8.5)

## 2020-05-09 LAB — LIPID PANEL
Chol/HDL Ratio: 4.3 ratio (ref 0.0–4.4)
Cholesterol, Total: 171 mg/dL (ref 100–199)
HDL: 40 mg/dL (ref >39)
LDL Chol Calc (NIH): 76 mg/dL (ref 0–99)
Triglycerides: 345 mg/dL — ABNORMAL HIGH (ref 0–149)
VLDL Cholesterol Cal: 55 mg/dL — ABNORMAL HIGH (ref 5–40)

## 2020-05-09 LAB — MICROALBUMIN / CREATININE URINE RATIO
Creatinine, Urine: 86.1 mg/dL
Microalb/Creat Ratio: 69 mg/g creat — ABNORMAL HIGH (ref 0–29)
Microalbumin, Urine: 59.4 ug/mL

## 2020-05-10 ENCOUNTER — Telehealth: Payer: Self-pay | Admitting: Nurse Practitioner

## 2020-05-10 NOTE — Telephone Encounter (Signed)
Calling to discuss the lab results from Monday 11/08, stated that pt has common uti's and results were abnormal

## 2020-05-10 NOTE — Telephone Encounter (Signed)
Spoke with daughter Myrle Sheng about microalbumin in urine and rest of lab work. Daughter verbalized understanding

## 2020-05-17 ENCOUNTER — Ambulatory Visit: Payer: Medicare HMO

## 2020-05-22 ENCOUNTER — Ambulatory Visit (INDEPENDENT_AMBULATORY_CARE_PROVIDER_SITE_OTHER): Payer: Medicare HMO

## 2020-05-22 DIAGNOSIS — I63419 Cerebral infarction due to embolism of unspecified middle cerebral artery: Secondary | ICD-10-CM

## 2020-05-22 LAB — CUP PACEART REMOTE DEVICE CHECK
Date Time Interrogation Session: 20211119223613
Implantable Pulse Generator Implant Date: 20181002

## 2020-05-24 ENCOUNTER — Ambulatory Visit: Payer: Medicare HMO

## 2020-05-24 NOTE — Progress Notes (Signed)
Carelink Summary Report / Loop Recorder 

## 2020-06-20 ENCOUNTER — Ambulatory Visit (INDEPENDENT_AMBULATORY_CARE_PROVIDER_SITE_OTHER): Payer: Medicare HMO

## 2020-06-20 DIAGNOSIS — I63419 Cerebral infarction due to embolism of unspecified middle cerebral artery: Secondary | ICD-10-CM

## 2020-06-20 LAB — CUP PACEART REMOTE DEVICE CHECK
Date Time Interrogation Session: 20211220223403
Implantable Pulse Generator Implant Date: 20181002

## 2020-06-21 ENCOUNTER — Ambulatory Visit: Payer: Medicare HMO

## 2020-07-05 NOTE — Progress Notes (Signed)
Carelink Summary Report / Loop Recorder 

## 2020-07-21 ENCOUNTER — Ambulatory Visit (INDEPENDENT_AMBULATORY_CARE_PROVIDER_SITE_OTHER): Payer: Medicare HMO

## 2020-07-21 DIAGNOSIS — I63419 Cerebral infarction due to embolism of unspecified middle cerebral artery: Secondary | ICD-10-CM

## 2020-07-21 LAB — CUP PACEART REMOTE DEVICE CHECK
Date Time Interrogation Session: 20220120223824
Implantable Pulse Generator Implant Date: 20181002

## 2020-07-24 ENCOUNTER — Other Ambulatory Visit: Payer: Self-pay | Admitting: Nurse Practitioner

## 2020-07-24 DIAGNOSIS — E1165 Type 2 diabetes mellitus with hyperglycemia: Secondary | ICD-10-CM

## 2020-08-02 NOTE — Progress Notes (Signed)
[facility] Summary Report / Loop Recorder ° °

## 2020-08-10 ENCOUNTER — Encounter: Payer: Self-pay | Admitting: Nurse Practitioner

## 2020-08-10 ENCOUNTER — Ambulatory Visit (INDEPENDENT_AMBULATORY_CARE_PROVIDER_SITE_OTHER): Payer: Medicare HMO | Admitting: Nurse Practitioner

## 2020-08-10 ENCOUNTER — Other Ambulatory Visit: Payer: Self-pay

## 2020-08-10 VITALS — BP 124/62 | HR 66 | Temp 97.7°F | Ht 70.0 in | Wt 197.6 lb

## 2020-08-10 DIAGNOSIS — I6930 Unspecified sequelae of cerebral infarction: Secondary | ICD-10-CM

## 2020-08-10 DIAGNOSIS — E781 Pure hyperglyceridemia: Secondary | ICD-10-CM | POA: Diagnosis not present

## 2020-08-10 DIAGNOSIS — K219 Gastro-esophageal reflux disease without esophagitis: Secondary | ICD-10-CM | POA: Diagnosis not present

## 2020-08-10 DIAGNOSIS — M8949 Other hypertrophic osteoarthropathy, multiple sites: Secondary | ICD-10-CM | POA: Diagnosis not present

## 2020-08-10 DIAGNOSIS — M159 Polyosteoarthritis, unspecified: Secondary | ICD-10-CM

## 2020-08-10 DIAGNOSIS — F339 Major depressive disorder, recurrent, unspecified: Secondary | ICD-10-CM

## 2020-08-10 DIAGNOSIS — I10 Essential (primary) hypertension: Secondary | ICD-10-CM | POA: Diagnosis not present

## 2020-08-10 DIAGNOSIS — F411 Generalized anxiety disorder: Secondary | ICD-10-CM

## 2020-08-10 DIAGNOSIS — E1165 Type 2 diabetes mellitus with hyperglycemia: Secondary | ICD-10-CM

## 2020-08-10 DIAGNOSIS — M1612 Unilateral primary osteoarthritis, left hip: Secondary | ICD-10-CM

## 2020-08-10 DIAGNOSIS — E876 Hypokalemia: Secondary | ICD-10-CM

## 2020-08-10 DIAGNOSIS — M818 Other osteoporosis without current pathological fracture: Secondary | ICD-10-CM

## 2020-08-10 DIAGNOSIS — Z0289 Encounter for other administrative examinations: Secondary | ICD-10-CM

## 2020-08-10 DIAGNOSIS — E559 Vitamin D deficiency, unspecified: Secondary | ICD-10-CM

## 2020-08-10 LAB — BAYER DCA HB A1C WAIVED: HB A1C (BAYER DCA - WAIVED): 6.2 % (ref <7.0)

## 2020-08-10 MED ORDER — TRAMADOL HCL 50 MG PO TABS: ORAL_TABLET | ORAL | 2 refills | Status: DC

## 2020-08-10 MED ORDER — GABAPENTIN 100 MG PO CAPS: 100.0000 mg | ORAL_CAPSULE | Freq: Three times a day (TID) | ORAL | 2 refills | Status: DC

## 2020-08-10 MED ORDER — CHLORTHALIDONE 25 MG PO TABS
12.5000 mg | ORAL_TABLET | Freq: Every day | ORAL | 1 refills | Status: DC
Start: 2020-08-10 — End: 2021-02-08

## 2020-08-10 MED ORDER — AMLODIPINE BESY-BENAZEPRIL HCL 10-40 MG PO CAPS: 1.0000 | ORAL_CAPSULE | Freq: Every day | ORAL | 1 refills | Status: DC

## 2020-08-10 MED ORDER — METFORMIN HCL ER 500 MG PO TB24
500.0000 mg | ORAL_TABLET | Freq: Two times a day (BID) | ORAL | 1 refills | Status: DC
Start: 2020-08-10 — End: 2021-02-08

## 2020-08-10 MED ORDER — OMEPRAZOLE 40 MG PO CPDR: 40.0000 mg | DELAYED_RELEASE_CAPSULE | Freq: Every day | ORAL | 1 refills | Status: DC

## 2020-08-10 MED ORDER — FENOFIBRATE 145 MG PO TABS: 145.0000 mg | ORAL_TABLET | Freq: Every day | ORAL | 1 refills | Status: DC

## 2020-08-10 MED ORDER — ROSUVASTATIN CALCIUM 40 MG PO TABS
ORAL_TABLET | ORAL | 1 refills | Status: DC
Start: 2020-08-10 — End: 2021-02-08

## 2020-08-10 MED ORDER — DULOXETINE HCL 60 MG PO CPEP: 60.0000 mg | ORAL_CAPSULE | Freq: Every day | ORAL | 1 refills | Status: DC

## 2020-08-10 NOTE — Patient Instructions (Signed)

## 2020-08-10 NOTE — Progress Notes (Addendum)
Subjective:    Patient ID: Caitlin Mccarthy, female    DOB: [birth year], 85 y.o.   MRN: [id number]   Chief Complaint: Medical Management of Chronic Issues    HPI:  1. Essential hypertension, benign No c/o chest pain, sob or headache. Does not check bloodpressure at home. BP Readings from Last 3 Encounters:  05/08/20 116/62  02/03/20 125/68  12/23/19 138/80     2. Pure hypertriglyceridemia Does try to watch diet. Does no exercise because she is afraid she will fall. Lab Results  Component Value Date   CHOL 171 05/08/2020   HDL 40 05/08/2020   LDLCALC 76 05/08/2020   TRIG 345 (H) 05/08/2020   CHOLHDL 4.3 05/08/2020     3. Type 2 diabetes mellitus with hyperglycemia, without long-term current use of insulin (HCC) Fasting blood sugars have been ranging from 90-170. She denies any lower then 90. Lab Results  Component Value Date   HGBA1C 5.9 05/08/2020     4. Gastroesophageal reflux disease without esophagitis She has been using prilosec and has not been helping but she does noit take it daily.  5. Primary osteoarthritis involving multiple joints Joints hurt all the time. Is on ultram when needed. Does not take every day   6. Other osteoporosis without current pathological fracture Has not had dexascan since 2017. Says she does not want to do anymore.  7. Episode of recurrent major depressive disorder, unspecified depression episode severity ([place]) Has good days and bad days. Is on cymbalta and is not helping like it use to. Depression screen [facility] 2/9 08/10/2020 05/08/2020 02/03/2020  Decreased Interest 0 0 0  Down, Depressed, Hopeless 0 0 0  PHQ - 2 Score 0 0 0  Altered sleeping 3 1 -  Tired, decreased energy 3 1 -  Change in appetite 3 0 -  Feeling bad or failure about yourself  1 0 -  Trouble concentrating 1 1 -  Moving slowly or fidgety/restless 0 0 -  Suicidal thoughts 0 0 -  PHQ-9 Score 11 3 -  Difficult doing work/chores Somewhat difficult Not difficult  at all -  Some recent data might be hidden     8. GAD (generalized anxiety disorder) Stays stressed GAD 7 : Generalized Anxiety Score 08/10/2020 05/08/2020 12/23/2019 10/25/2019  Nervous, Anxious, on Edge 1 0 0 1  Control/stop worrying 1 0 0 3  Worry too much - different things 2 0 0 3  Trouble relaxing 2 0 0 3  Restless 3 0 0 3  Easily annoyed or irritable 3 0 0 2  Afraid - awful might happen 1 0 0 3  Total GAD 7 Score 13 0 0 18  Anxiety Difficulty - Not difficult at all Not difficult at all Somewhat difficult      9. Hypokalemia denies any lower ext cramping Lab Results  Component Value Date   K 4.7 05/08/2020     10. Late effect of cerebrovascular accident (CVA) No residual effects. Walks with walker  11. Vitamin D deficiency Is not in daily vitamin d supplement    Outpatient Encounter Medications as of 08/10/2020  Medication Sig  . Alcohol Swabs (B-D SINGLE USE SWABS REGULAR) PADS Test BS daily and as needed Dx E11.65  . amLODipine-benazepril (LOTREL) 10-40 MG capsule Take 1 capsule by mouth daily.  [name] aspirin EC 81 MG tablet Take 1 tablet (81 mg total) by mouth daily.  . Baclofen 5 MG TABS Take 5 mg by mouth 3 (three) times daily  as needed.  . Blood Glucose Calibration (TRUE METRIX LEVEL 1) Low SOLN Use with glucose monitor Dx E11.65  . Blood Glucose Monitoring Suppl (TRUE METRIX AIR GLUCOSE METER) w/Device KIT Test BS daily and as needed Dx E11.65  . chlorthalidone (HYGROTON) 25 MG tablet Take 0.5 tablets (12.5 mg total) by mouth daily.  . cholecalciferol (VITAMIN D3) 25 MCG (1000 UNIT) tablet Take 1,000 Units by mouth daily.  . DULoxetine (CYMBALTA) 60 MG capsule Take 1 capsule (60 mg total) by mouth daily.  . fenofibrate (TRICOR) 145 MG tablet Take 1 tablet (145 mg total) by mouth daily.  . fluticasone (FLONASE) 50 MCG/ACT nasal spray Place 2 sprays into both nostrils daily.  [name] gabapentin (NEURONTIN) 100 MG capsule Take 1 capsule (100 mg total) by mouth 3 (three)  times daily.  [name] glucose blood (TRUE METRIX BLOOD GLUCOSE TEST) test strip Test BS daily and as needed Dx E11.65  . metFORMIN (GLUCOPHAGE-XR) 500 MG 24 hr tablet TAKE 2 TABLETS EVERY MORNING WITH BREAKFAST  . NYSTATIN powder APPLY 4 TIMES DAILY  . ondansetron (ZOFRAN-ODT) 4 MG disintegrating tablet TAKE 1 TABLET EVERY 8 HOURS AS NEEDED FOR NAUSEA AND VOMITING  . polyethylene glycol (MIRALAX / GLYCOLAX) 17 g packet Take 17 g by mouth daily.  [name] PREMARIN vaginal cream Apply 1 application topically daily as needed (irritation).   . rosuvastatin (CRESTOR) 40 MG tablet TAKE ONE (1) TABLET EACH DAY  . traMADol (ULTRAM) 50 MG tablet TAKE ONE TABLET FOUR TIMES DAILY  . TRUEplus Lancets 33G MISC Test BS daily and as needed Dx E11.65   No facility-administered encounter medications on file as of 08/10/2020.    Past Surgical History:  Procedure Laterality Date  . CATARACT EXTRACTION W/PHACO  06/06/2011   Procedure: CATARACT EXTRACTION PHACO AND INTRAOCULAR LENS PLACEMENT (IOC);  Surgeon: Tonny Branch;  Location: [facility];  Service: Ophthalmology;  Laterality: Right;  CDE=12.77  . CATARACT EXTRACTION W/PHACO  06/27/2011   Procedure: CATARACT EXTRACTION PHACO AND INTRAOCULAR LENS PLACEMENT (IOC);  Surgeon: Tonny Branch;  Location: [facility];  Service: Ophthalmology;  Laterality: Left;  CDE:13.96  . CYSTOSCOPY WITH URETEROSCOPY AND STENT PLACEMENT Right 04/27/2015   Procedure: CYSTOSCOPY WITH RIGHT URETEROSCOPY, BASKET REMOVAL OF STONE, REMOVAL OF RIGHT NEPHROSTOMY TUBE;  Surgeon: Irine Seal, MD;  Location: [place];  Service: Urology;  Laterality: Right;  . CYSTOSCOPY/URETEROSCOPY/HOLMIUM LASER/STENT PLACEMENT Left 12/24/2016   Procedure: LEFT URETEROSCOPY WITH HOLMIUM LASER AND STENT PLACEMENT;  Surgeon: Irine Seal, MD;  Location: [facility];  Service: Urology;  Laterality: Left;  . EXTRACORPOREAL SHOCK WAVE LITHOTRIPSY  left 04-01-2016;  1980's  . FRACTURE SURGERY    . HIP  ARTHROPLASTY    . HOLMIUM LASER APPLICATION Right 85/92/9244   Procedure: HOLMIUM LASER APPLICATION;  Surgeon: Irine Seal, MD;  Location: [facility];  Service: Urology;  Laterality: Right;  . KNEE ARTHROPLASTY    . KNEE ARTHROSCOPY Bilateral right 2005//  left ?  [name] LOOP RECORDER INSERTION N/A 04/01/2017   Procedure: LOOP RECORDER INSERTION;  Surgeon: Thompson Grayer, MD;  Location: [place] [facility];  Service: Cardiovascular;  Laterality: N/A;  . TOTAL HIP ARTHROPLASTY  10/28/2011   Procedure: TOTAL HIP ARTHROPLASTY;  Surgeon: Gearlean Alf, MD;  Location: [facility];  Service: Orthopedics;  Laterality: Right;  . TOTAL KNEE ARTHROPLASTY Bilateral left 03-09-2007//  right 2006  . TRANSTHORACIC ECHOCARDIOGRAM  10/06/2006   normal echo,  ef 55-60%  . TUBAL LIGATION  Family History  Problem Relation Age of Onset  . Kidney disease Mother   . Diabetes Mother   . Congestive Heart Failure Father   . Heart failure Father   . Heart disease Brother   . Alcohol abuse Brother   . Anesthesia problems Neg Hx   . Hypotension Neg Hx   . Malignant hyperthermia Neg Hx   . Pseudochol deficiency Neg Hx     New complaints: None today  Social history: Lives with her husband and her daughter checks on herdaily  Controlled substance contract: 08/10/20    Review of Systems  Constitutional: Negative for diaphoresis.  Eyes: Negative for pain.  Respiratory: Negative for shortness of breath.   Cardiovascular: Negative for chest pain, palpitations and leg swelling.  Gastrointestinal: Negative for abdominal pain.  Endocrine: Negative for polydipsia.  Skin: Negative for rash.  Neurological: Negative for dizziness, weakness and headaches.  Hematological: Does not bruise/bleed easily.  All other systems reviewed and are negative.      Objective:   Physical Exam Vitals and nursing note reviewed.  Constitutional:      General: She is not in acute distress.    Appearance: Normal  appearance. She is well-developed and well-nourished.  HENT:     Head: Normocephalic.     Nose: Nose normal.     Mouth/Throat:     Mouth: Oropharynx is clear and moist.  Eyes:     Extraocular Movements: EOM normal.     Pupils: Pupils are equal, round, and reactive to light.  Neck:     Vascular: No carotid bruit or JVD.  Cardiovascular:     Rate and Rhythm: Normal rate and regular rhythm.     Pulses: Intact distal pulses.     Heart sounds: Normal heart sounds.  Pulmonary:     Effort: Pulmonary effort is normal. No respiratory distress.     Breath sounds: Normal breath sounds. No wheezing or rales.  Chest:     Chest wall: No tenderness.  Abdominal:     General: Bowel sounds are normal. There is no distension or abdominal bruit. Aorta is normal.     Palpations: Abdomen is soft. There is no hepatomegaly, splenomegaly, mass or pulsatile mass.     Tenderness: There is no abdominal tenderness.  Musculoskeletal:        General: No edema. Normal range of motion.     Cervical back: Normal range of motion and neck supple.  Lymphadenopathy:     Cervical: No cervical adenopathy.  Skin:    General: Skin is warm and dry.  Neurological:     Mental Status: She is alert and oriented to person, place, and time.     Deep Tendon Reflexes: Reflexes are normal and symmetric.  Psychiatric:        Mood and Affect: Mood and affect normal.        Behavior: Behavior normal.        Thought Content: Thought content normal.        Judgment: Judgment normal.     BP 124/62   Pulse 66   Temp 97.7 F (36.5 C) (Temporal)   Ht '5\' 10"'  (1.778 m)   Wt 197 lb 9.6 oz (89.6 kg)   SpO2 92%   BMI 28.35 kg/m   HGBA1c 6.2%     Assessment & Plan:  RHYLEI MCQUAIG comes in today with chief complaint of Medical Management of Chronic Issues   Diagnosis and orders addressed:  1. Essential hypertension, benign Low  sodium diet - CBC with Differential/Platelet - CMP14+EGFR - chlorthalidone (HYGROTON) 25  MG tablet; Take 0.5 tablets (12.5 mg total) by mouth daily.  Dispense: 90 tablet; Refill: 1 - amLODipine-benazepril (LOTREL) 10-40 MG capsule; Take 1 capsule by mouth daily.  Dispense: 90 capsule; Refill: 1  2. Pure hypertriglyceridemia Low fat diet - Lipid panel - fenofibrate (TRICOR) 145 MG tablet; Take 1 tablet (145 mg total) by mouth daily.  Dispense: 90 tablet; Refill: 1 - rosuvastatin (CRESTOR) 40 MG tablet; TAKE ONE (1) TABLET EACH DAY  Dispense: 90 tablet; Refill: 1  3. Type 2 diabetes mellitus with hyperglycemia, without long-term current use of insulin (HCC) Continue to watch carbs in diet - Bayer DCA Hb A1c Waived - metFORMIN (GLUCOPHAGE-XR) 500 MG 24 hr tablet; Take 1 tablet (500 mg total) by mouth in the morning and at bedtime.  Dispense: 180 tablet; Refill: 1  4. Gastroesophageal reflux disease without esophagitis Avoid spicy foods Do not eat 2 hours prior to bedtime - omeprazole (PRILOSEC) 40 MG capsule; Take 1 capsule (40 mg total) by mouth daily.  Dispense: 90 capsule; Refill: 1  5. Primary osteoarthritis involving multiple joints  6. Other osteoporosis without current pathological fracture Weight bearing exercises as can tolerate - gabapentin (NEURONTIN) 100 MG capsule; Take 1 capsule (100 mg total) by mouth 3 (three) times daily.  Dispense: 90 capsule; Refill: 2  7. Episode of recurrent major depressive disorder, unspecified depression episode severity ([place]) Stress management - DULoxetine (CYMBALTA) 60 MG capsule; Take 1 capsule (60 mg total) by mouth daily.  Dispense: 90 capsule; Refill: 1  8. GAD (generalized anxiety disorder) - DULoxetine (CYMBALTA) 60 MG capsule; Take 1 capsule (60 mg total) by mouth daily.  Dispense: 90 capsule; Refill: 1  9. Hypokalemia Labs pending  10. Late effect of cerebrovascular accident (CVA)  5. Vitamin D deficiency Daily vitamin d supplement recommended  12. Pain management contract signed - traMADol (ULTRAM) 50 MG tablet;  TAKE ONE TABLET FOUR TIMES DAILY  Dispense: 60 tablet; Refill: 2  13. Primary osteoarthritis of left hip - traMADol (ULTRAM) 50 MG tablet; TAKE ONE TABLET FOUR TIMES DAILY  Dispense: 60 tablet; Refill: 2   Labs pending Health Maintenance reviewed Diet and exercise encouraged  Follow up plan: 3 months   Mary-Margaret Hassell Done, FNP

## 2020-08-11 LAB — CMP14+EGFR
ALT: 15 IU/L (ref 0–32)
AST: 23 IU/L (ref 0–40)
Albumin/Globulin Ratio: 1.5 (ref 1.2–2.2)
Albumin: 4.7 g/dL — ABNORMAL HIGH (ref 3.6–4.6)
Alkaline Phosphatase: 52 IU/L (ref 44–121)
BUN/Creatinine Ratio: 23 (ref 12–28)
BUN: 24 mg/dL (ref 8–27)
Bilirubin Total: 0.3 mg/dL (ref 0.0–1.2)
CO2: 23 mmol/L (ref 20–29)
Calcium: 11.2 mg/dL — ABNORMAL HIGH (ref 8.7–10.3)
Chloride: 101 mmol/L (ref 96–106)
Creatinine, Ser: 1.06 mg/dL — ABNORMAL HIGH (ref 0.57–1.00)
GFR calc Af Amer: 57 mL/min/{1.73_m2} — ABNORMAL LOW (ref >59)
GFR calc non Af Amer: 49 mL/min/{1.73_m2} — ABNORMAL LOW (ref >59)
Globulin, Total: 3.1 g/dL (ref 1.5–4.5)
Glucose: 94 mg/dL (ref 65–99)
Potassium: 4.5 mmol/L (ref 3.5–5.2)
Sodium: 138 mmol/L (ref 134–144)
Total Protein: 7.8 g/dL (ref 6.0–8.5)

## 2020-08-11 LAB — CBC WITH DIFFERENTIAL/PLATELET
Basophils Absolute: 0 10*3/uL (ref 0.0–0.2)
Basos: 1 %
EOS (ABSOLUTE): 0.1 10*3/uL (ref 0.0–0.4)
Eos: 1 %
Hematocrit: 41 % (ref 34.0–46.6)
Hemoglobin: 13.2 g/dL (ref 11.1–15.9)
Immature Grans (Abs): 0 10*3/uL (ref 0.0–0.1)
Immature Granulocytes: 0 %
Lymphocytes Absolute: 2.9 10*3/uL (ref 0.7–3.1)
Lymphs: 45 %
MCH: 30.1 pg (ref 26.6–33.0)
MCHC: 32.2 g/dL (ref 31.5–35.7)
MCV: 93 fL (ref 79–97)
Monocytes Absolute: 0.6 10*3/uL (ref 0.1–0.9)
Monocytes: 10 %
Neutrophils Absolute: 2.8 10*3/uL (ref 1.4–7.0)
Neutrophils: 43 %
Platelets: 240 10*3/uL (ref 150–450)
RBC: 4.39 x10E6/uL (ref 3.77–5.28)
RDW: 12.4 % (ref 11.7–15.4)
WBC: 6.4 10*3/uL (ref 3.4–10.8)

## 2020-08-11 LAB — LIPID PANEL
Chol/HDL Ratio: 3.6 ratio (ref 0.0–4.4)
Cholesterol, Total: 141 mg/dL (ref 100–199)
HDL: 39 mg/dL — ABNORMAL LOW (ref >39)
LDL Chol Calc (NIH): 62 mg/dL (ref 0–99)
Triglycerides: 250 mg/dL — ABNORMAL HIGH (ref 0–149)
VLDL Cholesterol Cal: 40 mg/dL (ref 5–40)

## 2020-08-21 ENCOUNTER — Ambulatory Visit (INDEPENDENT_AMBULATORY_CARE_PROVIDER_SITE_OTHER): Payer: Medicare HMO

## 2020-08-21 DIAGNOSIS — I63419 Cerebral infarction due to embolism of unspecified middle cerebral artery: Secondary | ICD-10-CM | POA: Diagnosis not present

## 2020-08-21 LAB — CUP PACEART REMOTE DEVICE CHECK
Date Time Interrogation Session: 20220220224319
Implantable Pulse Generator Implant Date: 20181002

## 2020-08-22 DIAGNOSIS — N200 Calculus of kidney: Secondary | ICD-10-CM | POA: Diagnosis not present

## 2020-08-22 DIAGNOSIS — N3020 Other chronic cystitis without hematuria: Secondary | ICD-10-CM | POA: Diagnosis not present

## 2020-08-22 DIAGNOSIS — R8279 Other abnormal findings on microbiological examination of urine: Secondary | ICD-10-CM | POA: Diagnosis not present

## 2020-08-29 NOTE — Progress Notes (Signed)
Carelink Summary Report / Loop Recorder 

## 2020-09-21 ENCOUNTER — Ambulatory Visit (INDEPENDENT_AMBULATORY_CARE_PROVIDER_SITE_OTHER): Payer: Medicare HMO

## 2020-09-21 DIAGNOSIS — I63419 Cerebral infarction due to embolism of unspecified middle cerebral artery: Secondary | ICD-10-CM | POA: Diagnosis not present

## 2020-09-21 LAB — CUP PACEART REMOTE DEVICE CHECK
Date Time Interrogation Session: 20220323234401
Implantable Pulse Generator Implant Date: 20181002

## 2020-10-03 ENCOUNTER — Other Ambulatory Visit: Payer: Self-pay | Admitting: Nurse Practitioner

## 2020-10-03 DIAGNOSIS — Z0289 Encounter for other administrative examinations: Secondary | ICD-10-CM

## 2020-10-03 DIAGNOSIS — M1612 Unilateral primary osteoarthritis, left hip: Secondary | ICD-10-CM

## 2020-10-03 NOTE — Progress Notes (Signed)
Carelink Summary Report / Loop Recorder 

## 2020-10-03 NOTE — Telephone Encounter (Signed)
Last office visit 08/10/20, instructed to follow up in 3 months.  Follow up scheduled 11/09/20 Last refill 08/10/20, #60, 2 refills, instructed to take 4 times daily

## 2020-10-23 ENCOUNTER — Ambulatory Visit (INDEPENDENT_AMBULATORY_CARE_PROVIDER_SITE_OTHER): Payer: Medicare HMO

## 2020-10-23 DIAGNOSIS — I63419 Cerebral infarction due to embolism of unspecified middle cerebral artery: Secondary | ICD-10-CM

## 2020-10-24 LAB — CUP PACEART REMOTE DEVICE CHECK
Date Time Interrogation Session: 20220423234441
Implantable Pulse Generator Implant Date: 20181002

## 2020-11-03 ENCOUNTER — Telehealth: Payer: Self-pay | Admitting: Emergency Medicine

## 2020-11-03 NOTE — Telephone Encounter (Signed)
Patient contacted about loop recorder meeting RRT as of 10/26/20. Patient status changed in [facility] to inactive and appointments in Epic cancelled. Unable to make contact to patient by phone about possible explant.

## 2020-11-06 ENCOUNTER — Other Ambulatory Visit: Payer: Self-pay

## 2020-11-06 DIAGNOSIS — E1165 Type 2 diabetes mellitus with hyperglycemia: Secondary | ICD-10-CM

## 2020-11-06 DIAGNOSIS — I10 Essential (primary) hypertension: Secondary | ICD-10-CM

## 2020-11-06 DIAGNOSIS — E781 Pure hyperglyceridemia: Secondary | ICD-10-CM

## 2020-11-08 ENCOUNTER — Other Ambulatory Visit: Payer: Self-pay

## 2020-11-08 ENCOUNTER — Other Ambulatory Visit: Payer: Medicare HMO

## 2020-11-08 DIAGNOSIS — E781 Pure hyperglyceridemia: Secondary | ICD-10-CM

## 2020-11-08 DIAGNOSIS — E1165 Type 2 diabetes mellitus with hyperglycemia: Secondary | ICD-10-CM | POA: Diagnosis not present

## 2020-11-08 DIAGNOSIS — I10 Essential (primary) hypertension: Secondary | ICD-10-CM

## 2020-11-08 LAB — BAYER DCA HB A1C WAIVED: HB A1C (BAYER DCA - WAIVED): 6.2 % (ref <7.0)

## 2020-11-09 ENCOUNTER — Ambulatory Visit (INDEPENDENT_AMBULATORY_CARE_PROVIDER_SITE_OTHER): Payer: Medicare HMO | Admitting: Nurse Practitioner

## 2020-11-09 ENCOUNTER — Encounter: Payer: Self-pay | Admitting: Nurse Practitioner

## 2020-11-09 VITALS — BP 143/63 | HR 62 | Temp 97.6°F | Resp 20 | Ht 70.0 in | Wt 198.0 lb

## 2020-11-09 DIAGNOSIS — K219 Gastro-esophageal reflux disease without esophagitis: Secondary | ICD-10-CM

## 2020-11-09 DIAGNOSIS — R300 Dysuria: Secondary | ICD-10-CM | POA: Diagnosis not present

## 2020-11-09 DIAGNOSIS — F411 Generalized anxiety disorder: Secondary | ICD-10-CM

## 2020-11-09 DIAGNOSIS — E663 Overweight: Secondary | ICD-10-CM

## 2020-11-09 DIAGNOSIS — E876 Hypokalemia: Secondary | ICD-10-CM

## 2020-11-09 DIAGNOSIS — E781 Pure hyperglyceridemia: Secondary | ICD-10-CM

## 2020-11-09 DIAGNOSIS — E1165 Type 2 diabetes mellitus with hyperglycemia: Secondary | ICD-10-CM | POA: Diagnosis not present

## 2020-11-09 DIAGNOSIS — F339 Major depressive disorder, recurrent, unspecified: Secondary | ICD-10-CM

## 2020-11-09 DIAGNOSIS — I6930 Unspecified sequelae of cerebral infarction: Secondary | ICD-10-CM

## 2020-11-09 DIAGNOSIS — M1612 Unilateral primary osteoarthritis, left hip: Secondary | ICD-10-CM

## 2020-11-09 DIAGNOSIS — E559 Vitamin D deficiency, unspecified: Secondary | ICD-10-CM

## 2020-11-09 DIAGNOSIS — I10 Essential (primary) hypertension: Secondary | ICD-10-CM | POA: Diagnosis not present

## 2020-11-09 LAB — CMP14+EGFR
ALT: 16 IU/L (ref 0–32)
AST: 19 IU/L (ref 0–40)
Albumin/Globulin Ratio: 1.4 (ref 1.2–2.2)
Albumin: 4.4 g/dL (ref 3.6–4.6)
Alkaline Phosphatase: 50 IU/L (ref 44–121)
BUN/Creatinine Ratio: 19 (ref 12–28)
BUN: 24 mg/dL (ref 8–27)
Bilirubin Total: 0.3 mg/dL (ref 0.0–1.2)
CO2: 25 mmol/L (ref 20–29)
Calcium: 11.7 mg/dL — ABNORMAL HIGH (ref 8.7–10.3)
Chloride: 103 mmol/L (ref 96–106)
Creatinine, Ser: 1.29 mg/dL — ABNORMAL HIGH (ref 0.57–1.00)
Globulin, Total: 3.2 g/dL (ref 1.5–4.5)
Glucose: 132 mg/dL — ABNORMAL HIGH (ref 65–99)
Potassium: 5.4 mmol/L — ABNORMAL HIGH (ref 3.5–5.2)
Sodium: 141 mmol/L (ref 134–144)
Total Protein: 7.6 g/dL (ref 6.0–8.5)
eGFR: 41 mL/min/{1.73_m2} — ABNORMAL LOW (ref >59)

## 2020-11-09 LAB — CBC WITH DIFFERENTIAL/PLATELET
Basophils Absolute: 0.1 10*3/uL (ref 0.0–0.2)
Basos: 1 %
EOS (ABSOLUTE): 0.2 10*3/uL (ref 0.0–0.4)
Eos: 2 %
Hematocrit: 39.8 % (ref 34.0–46.6)
Hemoglobin: 13.2 g/dL (ref 11.1–15.9)
Immature Grans (Abs): 0 10*3/uL (ref 0.0–0.1)
Immature Granulocytes: 0 %
Lymphocytes Absolute: 3.2 10*3/uL — ABNORMAL HIGH (ref 0.7–3.1)
Lymphs: 52 %
MCH: 30.8 pg (ref 26.6–33.0)
MCHC: 33.2 g/dL (ref 31.5–35.7)
MCV: 93 fL (ref 79–97)
Monocytes Absolute: 0.7 10*3/uL (ref 0.1–0.9)
Monocytes: 11 %
Neutrophils Absolute: 2.1 10*3/uL (ref 1.4–7.0)
Neutrophils: 34 %
Platelets: 232 10*3/uL (ref 150–450)
RBC: 4.29 x10E6/uL (ref 3.77–5.28)
RDW: 12.6 % (ref 11.7–15.4)
WBC: 6.3 10*3/uL (ref 3.4–10.8)

## 2020-11-09 LAB — URINALYSIS, COMPLETE
Bilirubin, UA: NEGATIVE
Glucose, UA: NEGATIVE
Ketones, UA: NEGATIVE
Nitrite, UA: NEGATIVE
Protein,UA: NEGATIVE
Specific Gravity, UA: 1.02 (ref 1.005–1.030)
Urobilinogen, Ur: 0.2 mg/dL (ref 0.2–1.0)
pH, UA: 5.5 (ref 5.0–7.5)

## 2020-11-09 LAB — LIPID PANEL
Chol/HDL Ratio: 3.9 ratio (ref 0.0–4.4)
Cholesterol, Total: 173 mg/dL (ref 100–199)
HDL: 44 mg/dL (ref >39)
LDL Chol Calc (NIH): 92 mg/dL (ref 0–99)
Triglycerides: 216 mg/dL — ABNORMAL HIGH (ref 0–149)
VLDL Cholesterol Cal: 37 mg/dL (ref 5–40)

## 2020-11-09 LAB — MICROSCOPIC EXAMINATION: RBC, Urine: NONE SEEN /hpf (ref 0–2)

## 2020-11-09 MED ORDER — TRAMADOL HCL 50 MG PO TABS: ORAL_TABLET | ORAL | 2 refills | Status: DC

## 2020-11-09 MED ORDER — GABAPENTIN 100 MG PO CAPS: 100.0000 mg | ORAL_CAPSULE | Freq: Three times a day (TID) | ORAL | 2 refills | Status: DC

## 2020-11-09 NOTE — Patient Instructions (Signed)
Fall Prevention in the Home, Adult Falls can cause injuries and can happen to people of all ages. There are many things you can do to make your home safe and to help prevent falls. Ask for help when making these changes. What actions can I take to prevent falls? General Instructions  Use good lighting in all rooms. Replace any light bulbs that burn out.  Turn on the lights in dark areas. Use night-lights.  Keep items that you use often in easy-to-reach places. Lower the shelves around your home if needed.  Set up your furniture so you have a clear path. Avoid moving your furniture around.  Do not have throw rugs or other things on the floor that can make you trip.  Avoid walking on wet floors.  If any of your floors are uneven, fix them.  Add color or contrast paint or tape to clearly mark and help you see: ? Grab bars or handrails. ? First and last steps of staircases. ? Where the edge of each step is.  If you use a stepladder: ? Make sure that it is fully opened. Do not climb a closed stepladder. ? Make sure the sides of the stepladder are locked in place. ? Ask someone to hold the stepladder while you use it.  Know where your pets are when moving through your home. What can I do in the bathroom?  Keep the floor dry. Clean up any water on the floor right away.  Remove soap buildup in the tub or shower.  Use nonskid mats or decals on the floor of the tub or shower.  Attach bath mats securely with double-sided, nonslip rug tape.  If you need to sit down in the shower, use a plastic, nonslip stool.  Install grab bars by the toilet and in the tub and shower. Do not use towel bars as grab bars.      What can I do in the bedroom?  Make sure that you have a light by your bed that is easy to reach.  Do not use any sheets or blankets for your bed that hang to the floor.  Have a firm chair with side arms that you can use for support when you get dressed. What can I do in  the kitchen?  Clean up any spills right away.  If you need to reach something above you, use a step stool with a grab bar.  Keep electrical cords out of the way.  Do not use floor polish or wax that makes floors slippery. What can I do with my stairs?  Do not leave any items on the stairs.  Make sure that you have a light switch at the top and the bottom of the stairs.  Make sure that there are handrails on both sides of the stairs. Fix handrails that are broken or loose.  Install nonslip stair treads on all your stairs.  Avoid having throw rugs at the top or bottom of the stairs.  Choose a carpet that does not hide the edge of the steps on the stairs.  Check carpeting to make sure that it is firmly attached to the stairs. Fix carpet that is loose or worn. What can I do on the outside of my home?  Use bright outdoor lighting.  Fix the edges of walkways and driveways and fix any cracks.  Remove anything that might make you trip as you walk through a door, such as a raised step or threshold.  Trim any   bushes or trees on paths to your home.  Check to see if handrails are loose or broken and that both sides of all steps have handrails.  Install guardrails along the edges of any raised decks and porches.  Clear paths of anything that can make you trip, such as tools or rocks.  Have leaves, snow, or ice cleared regularly.  Use sand or salt on paths during winter.  Clean up any spills in your garage right away. This includes grease or oil spills. What other actions can I take?  Wear shoes that: ? Have a low heel. Do not wear high heels. ? Have rubber bottoms. ? Feel good on your feet and fit well. ? Are closed at the toe. Do not wear open-toe sandals.  Use tools that help you move around if needed. These include: ? Canes. ? Walkers. ? Scooters. ? Crutches.  Review your medicines with your doctor. Some medicines can make you feel dizzy. This can increase your chance  of falling. Ask your doctor what else you can do to help prevent falls. Where to find more information  Centers for Disease Control and Prevention, [employer]: www.cdc.gov  National Institute on Aging: www.nia.nih.gov Contact a doctor if:  You are afraid of falling at home.  You feel weak, drowsy, or dizzy at home.  You fall at home. Summary  There are many simple things that you can do to make your home safe and to help prevent falls.  Ways to make your home safe include removing things that can make you trip and installing grab bars in the bathroom.  Ask for help when making these changes in your home. This information is not intended to replace advice given to you by your health care provider. Make sure you discuss any questions you have with your health care provider. Document Revised: 01/19/2020 Document Reviewed: 01/19/2020 Elsevier Patient Education  2021 [employer]  

## 2020-11-09 NOTE — Progress Notes (Signed)
Subjective:    Patient ID: Caitlin Mccarthy, female    DOB: 03-29-1936, 85 y.o.   MRN: [id number]   Chief Complaint: Medical Management of Chronic Issues    HPI:  1. Dysuria Will check urine today  2. Essential hypertension, benign No c/o chest pan, sob or headache. Does not check blood pressure at home. BP Readings from Last 3 Encounters:  11/09/20 (!) 143/63  08/10/20 124/62  05/08/20 116/62     3. Pure hypertriglyceridemia Does watch diet and does very little exercise. Lab Results  Component Value Date   CHOL 173 11/08/2020   HDL 44 11/08/2020   LDLCALC 92 11/08/2020   TRIG 216 (H) 11/08/2020   CHOLHDL 3.9 11/08/2020     4. Hypokalemia No c/o lower ext cramping. Lab Results  Component Value Date   K 5.4 (H) 11/08/2020      5. Type 2 diabetes mellitus with hyperglycemia, without long-term current use of insulin (HCC) fasting blood sugars are running around 120-150.no low blood sugars. Lab Results  Component Value Date   HGBA1C 6.2 11/08/2020     6. Gastroesophageal reflux disease without esophagitis Is on omeprazole daily.  7. Late effect of cerebrovascular accident (CVA) Has no residual effect from stroke. Does walk with walker to help with balance.  8. Vitamin D deficiency On daily vitamin d supplement  9. Overweight (BMI 25.0-29.9) No recent weight changes. . Wt Readings from Last 3 Encounters:  11/09/20 198 lb (89.8 kg)  08/10/20 197 lb 9.6 oz (89.6 kg)  05/08/20 196 lb (88.9 kg)   BMI Readings from Last 3 Encounters:  11/09/20 28.41 kg/m  08/10/20 28.35 kg/m  05/08/20 28.12 kg/m      Outpatient Encounter Medications as of 11/09/2020  Medication Sig  . Alcohol Swabs (B-D SINGLE USE SWABS REGULAR) PADS Test BS daily and as needed Dx E11.65  . amLODipine-benazepril (LOTREL) 10-40 MG capsule Take 1 capsule by mouth daily.  [name] aspirin EC 81 MG tablet Take 1 tablet (81 mg total) by mouth daily.  . Baclofen 5 MG TABS Take 5 mg by  mouth 3 (three) times daily as needed.  . Blood Glucose Calibration (TRUE METRIX LEVEL 1) Low SOLN Use with glucose monitor Dx E11.65  . Blood Glucose Monitoring Suppl (TRUE METRIX AIR GLUCOSE METER) w/Device KIT Test BS daily and as needed Dx E11.65  . chlorthalidone (HYGROTON) 25 MG tablet Take 0.5 tablets (12.5 mg total) by mouth daily.  . cholecalciferol (VITAMIN D3) 25 MCG (1000 UNIT) tablet Take 1,000 Units by mouth daily.  . DULoxetine (CYMBALTA) 60 MG capsule Take 1 capsule (60 mg total) by mouth daily.  . fenofibrate (TRICOR) 145 MG tablet Take 1 tablet (145 mg total) by mouth daily.  . fluticasone (FLONASE) 50 MCG/ACT nasal spray Place 2 sprays into both nostrils daily.  [name] gabapentin (NEURONTIN) 100 MG capsule Take 1 capsule (100 mg total) by mouth 3 (three) times daily.  [name] glucose blood (TRUE METRIX BLOOD GLUCOSE TEST) test strip Test BS daily and as needed Dx E11.65  . metFORMIN (GLUCOPHAGE-XR) 500 MG 24 hr tablet Take 1 tablet (500 mg total) by mouth in the morning and at bedtime.  . NYSTATIN powder APPLY 4 TIMES DAILY  . omeprazole (PRILOSEC) 40 MG capsule Take 1 capsule (40 mg total) by mouth daily.  . ondansetron (ZOFRAN-ODT) 4 MG disintegrating tablet TAKE 1 TABLET EVERY 8 HOURS AS NEEDED FOR NAUSEA AND VOMITING  . polyethylene glycol (MIRALAX / GLYCOLAX) 17 g packet  Take 17 g by mouth daily.  [name] PREMARIN vaginal cream Apply 1 application topically daily as needed (irritation).   . rosuvastatin (CRESTOR) 40 MG tablet TAKE ONE (1) TABLET EACH DAY  . traMADol (ULTRAM) 50 MG tablet TAKE ONE TABLET FOUR TIMES DAILY  . TRUEplus Lancets 33G MISC Test BS daily and as needed Dx E11.65   No facility-administered encounter medications on file as of 11/09/2020.    Past Surgical History:  Procedure Laterality Date  . CATARACT EXTRACTION W/PHACO  06/06/2011   Procedure: CATARACT EXTRACTION PHACO AND INTRAOCULAR LENS PLACEMENT (IOC);  Surgeon: Tonny Branch;  Location: [facility];  Service:  Ophthalmology;  Laterality: Right;  CDE=12.77  . CATARACT EXTRACTION W/PHACO  06/27/2011   Procedure: CATARACT EXTRACTION PHACO AND INTRAOCULAR LENS PLACEMENT (IOC);  Surgeon: Tonny Branch;  Location: [facility];  Service: Ophthalmology;  Laterality: Left;  CDE:13.96  . CYSTOSCOPY WITH URETEROSCOPY AND STENT PLACEMENT Right 04/27/2015   Procedure: CYSTOSCOPY WITH RIGHT URETEROSCOPY, BASKET REMOVAL OF STONE, REMOVAL OF RIGHT NEPHROSTOMY TUBE;  Surgeon: Irine Seal, MD;  Location: [facility];  Service: Urology;  Laterality: Right;  . CYSTOSCOPY/URETEROSCOPY/HOLMIUM LASER/STENT PLACEMENT Left 12/24/2016   Procedure: LEFT URETEROSCOPY WITH HOLMIUM LASER AND STENT PLACEMENT;  Surgeon: Irine Seal, MD;  Location: [facility];  Service: Urology;  Laterality: Left;  . EXTRACORPOREAL SHOCK WAVE LITHOTRIPSY  left 04-01-2016;  1980's  . FRACTURE SURGERY    . HIP ARTHROPLASTY    . HOLMIUM LASER APPLICATION Right 64/33/2951   Procedure: HOLMIUM LASER APPLICATION;  Surgeon: Irine Seal, MD;  Location: [facility];  Service: Urology;  Laterality: Right;  . KNEE ARTHROPLASTY    . KNEE ARTHROSCOPY Bilateral right 2005//  left ?  [name] LOOP RECORDER INSERTION N/A 04/01/2017   Procedure: LOOP RECORDER INSERTION;  Surgeon: Thompson Grayer, MD;  Location: [facility];  Service: Cardiovascular;  Laterality: N/A;  . TOTAL HIP ARTHROPLASTY  10/28/2011   Procedure: TOTAL HIP ARTHROPLASTY;  Surgeon: Gearlean Alf, MD;  Location: [facility];  Service: Orthopedics;  Laterality: Right;  . TOTAL KNEE ARTHROPLASTY Bilateral left 03-09-2007//  right 2006  . TRANSTHORACIC ECHOCARDIOGRAM  10/06/2006   normal echo,  ef 55-60%  . TUBAL LIGATION      Family History  Problem Relation Age of Onset  . Kidney disease Mother   . Diabetes Mother   . Congestive Heart Failure Father   . Heart failure Father   . Heart disease Brother   . Alcohol abuse Brother   . Anesthesia problems Neg Hx   .  Hypotension Neg Hx   . Malignant hyperthermia Neg Hx   . Pseudochol deficiency Neg Hx     New complaints: None today  Social history: Lives with her husband  Controlled substance contract: n/a    Review of Systems  Constitutional: Negative for diaphoresis.  Eyes: Negative for pain.  Respiratory: Negative for shortness of breath.   Cardiovascular: Negative for chest pain, palpitations and leg swelling.  Gastrointestinal: Negative for abdominal pain.  Endocrine: Negative for polydipsia.  Skin: Negative for rash.  Neurological: Negative for dizziness, weakness and headaches.  Hematological: Does not bruise/bleed easily.  All other systems reviewed and are negative.      Objective:   Physical Exam Vitals and nursing note reviewed.  Constitutional:      General: She is not in acute distress.    Appearance: Normal appearance. She is well-developed.  HENT:     Head: Normocephalic.  Nose: Nose normal.  Eyes:     Pupils: Pupils are equal, round, and reactive to light.  Neck:     Vascular: No carotid bruit or JVD.  Cardiovascular:     Rate and Rhythm: Normal rate and regular rhythm.     Heart sounds: Normal heart sounds.  Pulmonary:     Effort: Pulmonary effort is normal. No respiratory distress.     Breath sounds: Normal breath sounds. No wheezing or rales.  Chest:     Chest wall: No tenderness.  Abdominal:     General: Bowel sounds are normal. There is no distension or abdominal bruit.     Palpations: Abdomen is soft. There is no hepatomegaly, splenomegaly, mass or pulsatile mass.     Tenderness: There is no abdominal tenderness.  Musculoskeletal:        General: Normal range of motion.     Cervical back: Normal range of motion and neck supple.     Right lower leg: Edema present.     Left lower leg: Edema present.  Lymphadenopathy:     Cervical: No cervical adenopathy.  Skin:    General: Skin is warm and dry.  Neurological:     Mental Status: She is alert  and oriented to person, place, and time.     Deep Tendon Reflexes: Reflexes are normal and symmetric.  Psychiatric:        Behavior: Behavior normal.        Thought Content: Thought content normal.        Judgment: Judgment normal.   BP (!) 143/63   Pulse 62   Temp 97.6 F (36.4 C) (Temporal)   Resp 20   Ht _0  (1.778 m)   Wt 198 lb (89.8 kg)   SpO2 92%   BMI 28.41 kg/m    Urine clear      Assessment & Plan:  JASELLE PRYER comes in today with chief complaint of Medical Management of Chronic Issues   Diagnosis and orders addressed:  1. Dysuria Fore fluids - Urinalysis, Complete  2. Essential hypertension, benign Low sodium diet  3. Pure hypertriglyceridemia Low far diet  4. Hypokalemia Labs pendoing  5. Type 2 diabetes mellitus with hyperglycemia, without long-term current use of insulin (HCC) Continue to watch carbs in diet  6. Gastroesophageal reflux disease without esophagitis Avoid spicy foods Do not eat 2 hours prior to bedtime  7. Late effect of cerebrovascular accident (CVA) Fall prevention  8. Vitamin D deficiency Continue vitamin d supplement  9. Overweight (BMI 25.0-29.9) Discussed diet and exercise for person with BMI >25 Will recheck weight in 3-6 months  10. Episode of recurrent major depressive disorder, unspecified depression episode severity ([place])  11. GAD (generalized anxiety disorder)  12. Primary osteoarthritis of left hip - gabapentin (NEURONTIN) 100 MG capsule; Take 1 capsule (100 mg total) by mouth 3 (three) times daily.  Dispense: 90 capsule; Refill: 2 - traMADol (ULTRAM) 50 MG tablet; TAKE ONE TABLET FOUR TIMES DAILY  Dispense: 60 tablet; Refill: 2   Labs pending Health Maintenance reviewed Diet and exercise encouraged  Follow up plan: 3 months   Mary-Margaret Hassell Done, FNP

## 2020-11-10 NOTE — Progress Notes (Signed)
Carelink Summary Report / Loop Recorder 

## 2020-11-13 NOTE — Telephone Encounter (Signed)
Patient did not answer home number . Contacted cell and daughter Rojelio Brenner) answered . Patient unavailable to come to phone. Education done that ILR at [facility] and patient should unplug home monitor. Given option to leave loop recorder in place or call office for appointment with Dr Rayann Heman to remove the loop recorder. Misty verbalized understanding of options and patient will contact [facility] if she wants to have loop recorder extracted. Device Clinic # provided for questions or concerns.

## 2020-12-04 DIAGNOSIS — M25561 Pain in right knee: Secondary | ICD-10-CM | POA: Diagnosis not present

## 2020-12-04 DIAGNOSIS — Z96641 Presence of right artificial hip joint: Secondary | ICD-10-CM | POA: Diagnosis not present

## 2021-01-09 ENCOUNTER — Other Ambulatory Visit: Payer: Self-pay | Admitting: Nurse Practitioner

## 2021-01-09 DIAGNOSIS — M1612 Unilateral primary osteoarthritis, left hip: Secondary | ICD-10-CM

## 2021-02-02 ENCOUNTER — Ambulatory Visit (INDEPENDENT_AMBULATORY_CARE_PROVIDER_SITE_OTHER): Payer: Medicare HMO

## 2021-02-02 VITALS — Ht 70.0 in | Wt 198.0 lb

## 2021-02-02 DIAGNOSIS — Z0000 Encounter for general adult medical examination without abnormal findings: Secondary | ICD-10-CM | POA: Diagnosis not present

## 2021-02-02 NOTE — Progress Notes (Signed)
Subjective:   Caitlin Mccarthy is a 85 y.o. female who presents for Medicare Annual (Subsequent) preventive examination.  Virtual Visit via Telephone Note  I connected with  Caitlin Mccarthy on 02/02/21 at  1:15 PM EDT by telephone and verified that I am speaking with the correct person using two identifiers.  Location: Patient: Home Provider: [facility] Persons participating in the virtual visit: patient/Nurse Health Advisor   I discussed the limitations, risks, security and privacy concerns of performing an evaluation and management service by telephone and the availability of in person appointments. The patient expressed understanding and agreed to proceed.  Interactive audio and video telecommunications were attempted between this nurse and patient, however failed, due to patient having technical difficulties OR patient did not have access to video capability.  We continued and completed visit with audio only.  Some vital signs may be absent or patient reported.   Kendric Sindelar E Alizay Bronkema, LPN   Review of Systems     Cardiac Risk Factors include: advanced age (>10mn, >>32women);diabetes mellitus;dyslipidemia;hypertension;smoking/ tobacco exposure;sedentary lifestyle     Objective:    Today's Vitals   02/02/21 1323  Weight: 198 lb (89.8 kg)  Height: _0  (1.778 m)  PainSc: 3    Body mass index is 28.41 kg/m.  Advanced Directives 02/02/2021 01/31/2020 01/11/2019 04/01/2017 01/09/2017 01/09/2017 04/01/2016  Does Patient Have a Medical Advance Directive? Yes Yes Yes No No No No  [place];Healthcare Power of Attorney Living will - - - -  Does patient want to make changes to medical advance directive? - No - Patient declined No - Patient declined - - - No - Patient declined  Copy of [place] Chart? No - copy requested No - copy requested - - - - No - copy requested  Would patient like information on  creating a medical advance directive? - - - No - Patient declined No - Patient declined - Yes - Educational materials given  Pre-existing out of facility DNR order (yellow form or pink MOST form) - - - - - - -    Current Medications (verified) Outpatient Encounter Medications as of 02/02/2021  Medication Sig   Alcohol Swabs (B-D SINGLE USE SWABS REGULAR) PADS Test BS daily and as needed Dx E11.65   amLODipine-benazepril (LOTREL) 10-40 MG capsule Take 1 capsule by mouth daily.   aspirin EC 81 MG tablet Take 1 tablet (81 mg total) by mouth daily.   Blood Glucose Calibration (TRUE METRIX LEVEL 1) Low SOLN Use with glucose monitor Dx E11.65   Blood Glucose Monitoring Suppl (TRUE METRIX AIR GLUCOSE METER) w/Device KIT Test BS daily and as needed Dx E11.65   chlorthalidone (HYGROTON) 25 MG tablet Take 0.5 tablets (12.5 mg total) by mouth daily.   cholecalciferol (VITAMIN D3) 25 MCG (1000 UNIT) tablet Take 1,000 Units by mouth daily.   DULoxetine (CYMBALTA) 60 MG capsule Take 1 capsule (60 mg total) by mouth daily.   fenofibrate (TRICOR) 145 MG tablet Take 1 tablet (145 mg total) by mouth daily.   fluticasone (FLONASE) 50 MCG/ACT nasal spray Place 2 sprays into both nostrils daily.   gabapentin (NEURONTIN) 100 MG capsule Take 1 capsule (100 mg total) by mouth 3 (three) times daily.   glucose blood (TRUE METRIX BLOOD GLUCOSE TEST) test strip Test BS daily and as needed Dx E11.65   metFORMIN (GLUCOPHAGE-XR) 500 MG 24 hr tablet Take 1 tablet (500 mg total)  by mouth in the morning and at bedtime.   omeprazole (PRILOSEC) 40 MG capsule Take 1 capsule (40 mg total) by mouth daily.   polyethylene glycol (MIRALAX / GLYCOLAX) 17 g packet Take 17 g by mouth daily.   PREMARIN vaginal cream Apply 1 application topically daily as needed (irritation).    rosuvastatin (CRESTOR) 40 MG tablet TAKE ONE (1) TABLET EACH DAY   traMADol (ULTRAM) 50 MG tablet TAKE ONE TABLET FOUR TIMES DAILY   TRUEplus Lancets 33G MISC Test  BS daily and as needed Dx E11.65   Baclofen 5 MG TABS Take 5 mg by mouth 3 (three) times daily as needed. (Patient not taking: Reported on 02/02/2021)   methocarbamol (ROBAXIN) 500 MG tablet  (Patient not taking: Reported on 02/02/2021)   NYSTATIN powder APPLY 4 TIMES DAILY (Patient not taking: Reported on 02/02/2021)   ondansetron (ZOFRAN-ODT) 4 MG disintegrating tablet TAKE 1 TABLET EVERY 8 HOURS AS NEEDED FOR NAUSEA AND VOMITING (Patient not taking: Reported on 02/02/2021)   No facility-administered encounter medications on file as of 02/02/2021.    Allergies (verified) Iodinated diagnostic agents and Latex   History: Past Medical History:  Diagnosis Date   Anxiety    Depression    GERD (gastroesophageal reflux disease)    History of acute pyelonephritis    last episode 04-13-2015  w/ sepsis   History of kidney stones    History of recurrent UTIs    MULTIPLE   Hyperlipidemia    Hypertension    OA (osteoarthritis)    knees, hips, hands   Osteoporosis    Poor memory    especially when has uti   Renal calculus, left    Stroke ([place])    Type 2 diabetes mellitus ([place])    Vitamin D deficiency 05/10/2016   Past Surgical History:  Procedure Laterality Date   CATARACT EXTRACTION W/PHACO  06/06/2011   Procedure: CATARACT EXTRACTION PHACO AND INTRAOCULAR LENS PLACEMENT ([place]);  Surgeon: Tonny Branch;  Location: [facility];  Service: Ophthalmology;  Laterality: Right;  CDE=12.77   CATARACT EXTRACTION W/PHACO  06/27/2011   Procedure: CATARACT EXTRACTION PHACO AND INTRAOCULAR LENS PLACEMENT ([facility]);  Surgeon: Tonny Branch;  Location: [facility];  Service: Ophthalmology;  Laterality: Left;  CDE:13.96   CYSTOSCOPY WITH URETEROSCOPY AND STENT PLACEMENT Right 04/27/2015   Procedure: CYSTOSCOPY WITH RIGHT URETEROSCOPY, BASKET REMOVAL OF STONE, REMOVAL OF RIGHT NEPHROSTOMY TUBE;  Surgeon: Irine Seal, MD;  Location: [facility];  Service: Urology;  Laterality: Right;   CYSTOSCOPY/URETEROSCOPY/HOLMIUM  LASER/STENT PLACEMENT Left 12/24/2016   Procedure: LEFT URETEROSCOPY WITH HOLMIUM LASER AND STENT PLACEMENT;  Surgeon: Irine Seal, MD;  Location[facility];  Service: Urology;  Laterality: Left;   EXTRACORPOREAL SHOCK WAVE LITHOTRIPSY  left 04-01-2016;  1980's   FRACTURE SURGERY     HIP ARTHROPLASTY     HOLMIUM LASER APPLICATION Right 63/07/6008   Procedure: HOLMIUM LASER APPLICATION;  Surgeon: Irine Seal, MD;  Location: [facility];  Service: Urology;  Laterality: Right;   KNEE ARTHROPLASTY     KNEE ARTHROSCOPY Bilateral right 2005//  left ?   LOOP RECORDER INSERTION N/A 04/01/2017   Procedure: LOOP RECORDER INSERTION;  Surgeon: Thompson Grayer, MD;  Location: [facility];  Service: Cardiovascular;  Laterality: N/A;   TOTAL HIP ARTHROPLASTY  10/28/2011   Procedure: TOTAL HIP ARTHROPLASTY;  Surgeon: Gearlean Alf, MD;  Location: [facility];  Service: Orthopedics;  Laterality: Right;   TOTAL KNEE ARTHROPLASTY Bilateral left 03-09-2007//  right 2006   TRANSTHORACIC ECHOCARDIOGRAM  10/06/2006   normal echo,  ef 55-60%   TUBAL LIGATION     Family History  Problem Relation Age of Onset   Kidney disease Mother    Diabetes Mother    Congestive Heart Failure Father    Heart failure Father    Heart disease Brother    Alcohol abuse Brother    Anesthesia problems Neg Hx    Hypotension Neg Hx    Malignant hyperthermia Neg Hx    Pseudochol deficiency Neg Hx    Social History   Socioeconomic History   Marital status: Married    Spouse name: Ted   Number of children: 2   Years of education: 11   Highest education level: Not on file  Occupational History   Occupation: Retired    Comment: retired  Tobacco Use   Smoking status: Every Day    Packs/day: 0.25    Years: 42.00    Pack years: 10.50    Types: Cigarettes   Smokeless tobacco: Never   Tobacco comments:    1 pack last 4 days   Vaping Use   Vaping Use: Never used  Substance and Sexual Activity    Alcohol use: No   Drug use: No   Sexual activity: Not Currently    Birth control/protection: Post-menopausal  Other Topics Concern   Not on file  Social History Narrative   Lives with husband   Her daughters take turns coming daily.   Social Determinants of Health   Financial Resource Strain: Low Risk    Difficulty of Paying Living Expenses: Not very hard  Food Insecurity: No Food Insecurity   Worried About Charity fundraiser in the Last Year: Never true   Ran Out of Food in the Last Year: Never true  Transportation Needs: No Transportation Needs   Lack of Transportation (Medical): No   Lack of Transportation (Non-Medical): No  Physical Activity: Insufficiently Active   Days of Exercise per Week: 7 days   Minutes of Exercise per Session: 10 min  Stress: No Stress Concern Present   Feeling of Stress : Only a little  Social Connections: Moderately Integrated   Frequency of Communication with Friends and Family: More than three times a week   Frequency of Social Gatherings with Friends and Family: More than three times a week   Attends Religious Services: 1 to 4 times per year   Active Member of [employer] or Organizations: No   Attends Music therapist: Never   Marital Status: Married    Tobacco Counseling Ready to quit: Not Answered Counseling given: Not Answered Tobacco comments: 1 pack last 4 days    Clinical Intake:  Pre-visit preparation completed: Yes  Pain : 0-10 Pain Score: 3  Pain Type: Chronic pain Pain Location: Knee Pain Orientation: Right, Left Pain Descriptors / Indicators: Aching, Sore, Tender Pain Onset: More than a month ago Pain Frequency: Intermittent     BMI - recorded: 28.41 Nutritional Status: BMI 25 -29 Overweight Nutritional Risks: None Diabetes: Yes CBG done?: No Did pt. bring in CBG monitor from home?: No  How often do you need to have someone help you when you read instructions, pamphlets, or other written materials  from your doctor or pharmacy?: 1 - Never  Nutrition Risk Assessment:  Has the patient had any N/V/D within the last 2 months?  No  Does the patient have any non-healing wounds?  No  Has the patient had any  unintentional weight loss or weight gain?  No   Diabetes:  Is the patient diabetic?  Yes  If diabetic, was a CBG obtained today?  No  Did the patient bring in their glucometer from home?  No  How often do you monitor your CBG's? Once daily fasting - around 140 this am per patient.   Financial Strains and Diabetes Management:  Are you having any financial strains with the device, your supplies or your medication? No .  Does the patient want to be seen by Chronic Care Management for management of their diabetes?  No  Would the patient like to be referred to a Nutritionist or for Diabetic Management?  No   Diabetic Exams:  Diabetic Eye Exam: Completed 12/10/2019. Overdue for diabetic eye exam. Pt has been advised about the importance in completing this exam. She will make appt soon  Diabetic Foot Exam: Completed 04/06/2019. Pt has been advised about the importance in completing this exam. Pt is scheduled for diabetic foot exam on 01/2021.    Interpreter Needed?: No  Information entered by :: Gennesis Hogland, LPN   Activities of Daily Living In your present state of health, do you have any difficulty performing the following activities: 02/02/2021  Hearing? Y  Comment a little  Vision? N  Difficulty concentrating or making decisions? Y  Walking or climbing stairs? Y  Dressing or bathing? N  Doing errands, shopping? N  Comment says she can drive, but her daughters usually take her to visits and shopping  Preparing Food and eating ? N  Using the Toilet? N  In the past six months, have you accidently leaked urine? Y  Comment rarely  Do you have problems with loss of bowel control? N  Managing your Medications? N  Managing your Finances? N  Comment but her daughters usually handle   Housekeeping or managing your Housekeeping? Y  Comment daughters help  Some recent data might be hidden    Patient Care Team: [name], FNP as PCP - General (Family Medicine) Herminio Commons, MD (Inactive) as PCP - Cardiology (Cardiology) Ilean China, RN as Registered Nurse  Indicate any recent Medical Services you may have received from other than [facility] providers in the past year (date may be approximate).     Assessment:   This is a routine wellness examination for [name].  Hearing/Vision screen Hearing Screening - Comments:: Denies hearing difficulties  Vision Screening - Comments:: Wears eyeglasses - up to date with annual eye exams with Dr Marin Comment in [place]  Dietary issues and exercise activities discussed: Current Exercise Habits: Home exercise routine, Type of exercise: walking;stretching, Time (Minutes): 15, Frequency (Times/Week): 7, Weekly Exercise (Minutes/Week): 105, Intensity: Mild, Exercise limited by: orthopedic condition(s)   Goals Addressed             This Visit's Progress    DIET - EAT MORE FRUITS AND VEGETABLES   On track    Exercise 150 min/wk Moderate Activity   Not on track    Walk with walker as much as possible       Depression Screen PHQ 2/9 Scores 02/02/2021 11/09/2020 08/10/2020 05/08/2020 02/03/2020 01/31/2020 12/23/2019  PHQ - 2 Score 1 1 0 0 0 3 0  PHQ- 9 Score - _0 - 6 0    Fall Risk Fall Risk  02/02/2021 11/09/2020 08/10/2020 05/08/2020 02/03/2020  Falls in the past year? 0 0 0 0 0  Number falls in past yr: 0 - 0 - -  Injury with Fall? 0 - 0 - -  Comment - - - - -  Risk for fall due to : Impaired balance/gait;Impaired vision;Medication side effect;Orthopedic patient - No Fall Risks - -  Risk for fall due to: Comment - - - - -  Follow up Education provided;Falls prevention discussed - Education provided - -    FALL RISK PREVENTION PERTAINING TO THE HOME:  Any stairs in or around the home? Yes  If so, are there any without  handrails? No  Home free of loose throw rugs in walkways, pet beds, electrical cords, etc? Yes  Adequate lighting in your home to reduce risk of falls? Yes   ASSISTIVE DEVICES UTILIZED TO PREVENT FALLS:  Life alert? No  Use of a cane, walker or w/c? Yes  Grab bars in the bathroom? Yes  Shower chair or bench in shower? Yes  Elevated toilet seat or a handicapped toilet? Yes   TIMED UP AND GO:  Was the test performed? No . Telephonic visit.  Cognitive Function:     6CIT Screen 02/02/2021 01/31/2020 01/11/2019  What Year? 0 points 0 points 0 points  What month? 0 points 0 points 0 points  What time? 0 points 0 points 0 points  Count back from 20 0 points 0 points 0 points  Months in reverse 2 points 2 points 0 points  Repeat phrase 2 points 2 points 0 points  Total Score 4 4 0    Immunizations Immunization History  Administered Date(s) Administered   Fluad Quad(high Dose 65+) 04/06/2019, 03/23/2020   Influenza, High Dose Seasonal PF 04/21/2017, 04/06/2018   Influenza,inj,Quad PF,6+ Mos 04/02/2013, 04/06/2014, 04/14/2015, 04/05/2016   Influenza,inj,quad, With Preservative 03/31/2017, 03/31/2018   Moderna Sars-Covid-2 Vaccination 08/02/2019, 08/29/2019   Pneumococcal Conjugate-13 11/24/2014   Pneumococcal Polysaccharide-23 06/08/2003, 10/12/2010   Pneumococcal-Unspecified 03/31/2017   Td 10/12/2010   Tdap 10/12/2010   Zoster Recombinat (Shingrix) 02/03/2020    TDAP status: Due, Education has been provided regarding the importance of this vaccine. Advised may receive this vaccine at local pharmacy or Health Dept. Aware to provide a copy of the vaccination record if obtained from local pharmacy or Health Dept. Verbalized acceptance and understanding.  Flu Vaccine status: Up to date  Pneumococcal vaccine status: Up to date  Covid-19 vaccine status: Completed vaccines  Qualifies for Shingles Vaccine? Yes   Zostavax completed Yes   Shingrix Completed?: No.    Education has been  provided regarding the importance of this vaccine. Patient has been advised to call insurance company to determine out of pocket expense if they have not yet received this vaccine. Advised may also receive vaccine at local pharmacy or Health Dept. Verbalized acceptance and understanding.  Screening Tests Health Maintenance  Topic Date Due   Zoster Vaccines- Shingrix (2 of 2) 03/30/2020   FOOT EXAM  04/05/2020   TETANUS/TDAP  10/11/2020   INFLUENZA VACCINE  01/29/2021   OPHTHALMOLOGY EXAM  02/09/2021 (Originally 05/26/2019)   COVID-19 Vaccine (3 - Booster for Moderna series) 02/09/2021 (Originally 01/26/2020)   HEMOGLOBIN A1C  05/11/2021   DEXA SCAN  Completed   PNA vac Low Risk Adult  Completed   HPV VACCINES  Aged Out    Health Maintenance  Health Maintenance Due  Topic Date Due   Zoster Vaccines- Shingrix (2 of 2) 03/30/2020   FOOT EXAM  04/05/2020   TETANUS/TDAP  10/11/2020   INFLUENZA VACCINE  01/29/2021    Colorectal cancer screening: No longer required.   Mammogram status: No  longer required due to ge.  Bone Density status: Completed 04/10/2016. Results reflect: Bone density results: OSTEOPOROSIS. Repeat every 2 years.  Lung Cancer Screening: (Low Dose CT Chest recommended if Age 64-80 years, 30 pack-year currently smoking OR have quit w/in 15years.) does qualify.   Lung Cancer Screening Referral:  declines  Additional Screening:  Hepatitis C Screening: does not qualify  Vision Screening: Recommended annual ophthalmology exams for early detection of glaucoma and other disorders of the eye. Is the patient up to date with their annual eye exam?  Yes  Who is the provider or what is the name of the office in which the patient attends annual eye exams? [facility] in [place] If pt is not established with a provider, would they like to be referred to a provider to establish care? No .   Dental Screening: Recommended annual dental exams for proper oral hygiene  Community  Resource Referral / Chronic Care Management: CRR required this visit?  No   CCM required this visit?  No      Plan:     I have personally reviewed and noted the following in the patient's chart:   Medical and social history Use of alcohol, tobacco or illicit drugs  Current medications and supplements including opioid prescriptions.  Functional ability and status Nutritional status Physical activity Advanced directives List of other physicians Hospitalizations, surgeries, and ER visits in previous 12 months Vitals Screenings to include cognitive, depression, and falls Referrals and appointments  In addition, I have reviewed and discussed with patient certain preventive protocols, quality metrics, and best practice recommendations. A written personalized care plan for preventive services as well as general preventive health recommendations were provided to patient.     Sandrea Hammond, LPN   08/04/9322   Nurse Notes: None

## 2021-02-02 NOTE — Patient Instructions (Signed)
Caitlin Mccarthy , Thank you for taking time to come for your Medicare Wellness Visit. I appreciate your ongoing commitment to your health goals. Please review the following plan we discussed and let me know if I can assist you in the future.   Screening recommendations/referrals: Colonoscopy: No longer required Mammogram: No longer required Bone Density: Done 04/10/2016 - Repeat every 2 years *due Recommended yearly ophthalmology/optometry visit for glaucoma screening and checkup Recommended yearly dental visit for hygiene and checkup  Vaccinations: Influenza vaccine: Done 03/23/2020 - Repeat annually Pneumococcal vaccine: Done 03/31/2017 & 10/12/2010 Tdap vaccine: Done 10/12/2010 - Repeat in 10 years *due Shingles vaccine: First dose done 02/03/2020, *due for second dose   Covid-19:Done 08/02/19 & 08/29/19  Advanced directives: Please bring a copy of your health care power of attorney and living will to the office to be added to your chart at your convenience.   Conditions/risks identified: Aim for 30 minutes of exercise each day, drink 6-8 glasses of water and eat lots of fruits and vegetables.   Next appointment: Follow up in one year for your annual wellness visit    Preventive Care 65 Years and Older, Female Preventive care refers to lifestyle choices and visits with your health care provider that can promote health and wellness. What does preventive care include? A yearly physical exam. This is also called an annual well check. Dental exams once or twice a year. Routine eye exams. Ask your health care provider how often you should have your eyes checked. Personal lifestyle choices, including: Daily care of your teeth and gums. Regular physical activity. Eating a healthy diet. Avoiding tobacco and drug use. Limiting alcohol use. Practicing safe sex. Taking low-dose aspirin every day. Taking vitamin and mineral supplements as recommended by your health care provider. What happens during  an annual well check? The services and screenings done by your health care provider during your annual well check will depend on your age, overall health, lifestyle risk factors, and family history of disease. Counseling  Your health care provider may ask you questions about your: Alcohol use. Tobacco use. Drug use. Emotional well-being. Home and relationship well-being. Sexual activity. Eating habits. History of falls. Memory and ability to understand (cognition). Work and work Statistician. Reproductive health. Screening  You may have the following tests or measurements: Height, weight, and BMI. Blood pressure. Lipid and cholesterol levels. These may be checked every 5 years, or more frequently if you are over 52 years old. Skin check. Lung cancer screening. You may have this screening every year starting at age 93 if you have a 30-pack-year history of smoking and currently smoke or have quit within the past 15 years. Fecal occult blood test (FOBT) of the stool. You may have this test every year starting at age 21. Flexible sigmoidoscopy or colonoscopy. You may have a sigmoidoscopy every 5 years or a colonoscopy every 10 years starting at age 86. Hepatitis C blood test. Hepatitis B blood test. Sexually transmitted disease (STD) testing. Diabetes screening. This is done by checking your blood sugar (glucose) after you have not eaten for a while (fasting). You may have this done every 1-3 years. Bone density scan. This is done to screen for osteoporosis. You may have this done starting at age 10. Mammogram. This may be done every 1-2 years. Talk to your health care provider about how often you should have regular mammograms. Talk with your health care provider about your test results, treatment options, and if necessary, the need for more  tests. Vaccines  Your health care provider may recommend certain vaccines, such as: Influenza vaccine. This is recommended every year. Tetanus,  diphtheria, and acellular pertussis (Tdap, Td) vaccine. You may need a Td booster every 10 years. Zoster vaccine. You may need this after age 71. Pneumococcal 13-valent conjugate (PCV13) vaccine. One dose is recommended after age 17. Pneumococcal polysaccharide (PPSV23) vaccine. One dose is recommended after age 46. Talk to your health care provider about which screenings and vaccines you need and how often you need them. This information is not intended to replace advice given to you by your health care provider. Make sure you discuss any questions you have with your health care provider. Document Released: 07/14/2015 Document Revised: 03/06/2016 Document Reviewed: 04/18/2015 Elsevier Interactive Patient Education  2017 [place] Prevention in the Home Falls can cause injuries. They can happen to people of all ages. There are many things you can do to make your home safe and to help prevent falls. What can I do on the outside of my home? Regularly fix the edges of walkways and driveways and fix any cracks. Remove anything that might make you trip as you walk through a door, such as a raised step or threshold. Trim any bushes or trees on the path to your home. Use bright outdoor lighting. Clear any walking paths of anything that might make someone trip, such as rocks or tools. Regularly check to see if handrails are loose or broken. Make sure that both sides of any steps have handrails. Any raised decks and porches should have guardrails on the edges. Have any leaves, snow, or ice cleared regularly. Use sand or salt on walking paths during winter. Clean up any spills in your garage right away. This includes oil or grease spills. What can I do in the bathroom? Use night lights. Install grab bars by the toilet and in the tub and shower. Do not use towel bars as grab bars. Use non-skid mats or decals in the tub or shower. If you need to sit down in the shower, use a plastic,  non-slip stool. Keep the floor dry. Clean up any water that spills on the floor as soon as it happens. Remove soap buildup in the tub or shower regularly. Attach bath mats securely with double-sided non-slip rug tape. Do not have throw rugs and other things on the floor that can make you trip. What can I do in the bedroom? Use night lights. Make sure that you have a light by your bed that is easy to reach. Do not use any sheets or blankets that are too big for your bed. They should not hang down onto the floor. Have a firm chair that has side arms. You can use this for support while you get dressed. Do not have throw rugs and other things on the floor that can make you trip. What can I do in the kitchen? Clean up any spills right away. Avoid walking on wet floors. Keep items that you use a lot in easy-to-reach places. If you need to reach something above you, use a strong step stool that has a grab bar. Keep electrical cords out of the way. Do not use floor polish or wax that makes floors slippery. If you must use wax, use non-skid floor wax. Do not have throw rugs and other things on the floor that can make you trip. What can I do with my stairs? Do not leave any items on the stairs. Make sure  that there are handrails on both sides of the stairs and use them. Fix handrails that are broken or loose. Make sure that handrails are as long as the stairways. Check any carpeting to make sure that it is firmly attached to the stairs. Fix any carpet that is loose or worn. Avoid having throw rugs at the top or bottom of the stairs. If you do have throw rugs, attach them to the floor with carpet tape. Make sure that you have a light switch at the top of the stairs and the bottom of the stairs. If you do not have them, ask someone to add them for you. What else can I do to help prevent falls? Wear shoes that: Do not have high heels. Have rubber bottoms. Are comfortable and fit you well. Are closed  at the toe. Do not wear sandals. If you use a stepladder: Make sure that it is fully opened. Do not climb a closed stepladder. Make sure that both sides of the stepladder are locked into place. Ask someone to hold it for you, if possible. Clearly mark and make sure that you can see: Any grab bars or handrails. First and last steps. Where the edge of each step is. Use tools that help you move around (mobility aids) if they are needed. These include: Canes. Walkers. Scooters. Crutches. Turn on the lights when you go into a dark area. Replace any light bulbs as soon as they burn out. Set up your furniture so you have a clear path. Avoid moving your furniture around. If any of your floors are uneven, fix them. If there are any pets around you, be aware of where they are. Review your medicines with your doctor. Some medicines can make you feel dizzy. This can increase your chance of falling. Ask your doctor what other things that you can do to help prevent falls. This information is not intended to replace advice given to you by your health care provider. Make sure you discuss any questions you have with your health care provider. Document Released: 04/13/2009 Document Revised: 11/23/2015 Document Reviewed: 07/22/2014 Elsevier Interactive Patient Education  2017 [employer].

## 2021-02-08 ENCOUNTER — Ambulatory Visit (INDEPENDENT_AMBULATORY_CARE_PROVIDER_SITE_OTHER): Payer: Medicare HMO | Admitting: Nurse Practitioner

## 2021-02-08 ENCOUNTER — Other Ambulatory Visit: Payer: Self-pay

## 2021-02-08 ENCOUNTER — Encounter: Payer: Self-pay | Admitting: Nurse Practitioner

## 2021-02-08 VITALS — BP 113/71 | HR 72 | Temp 97.7°F | Resp 20 | Ht 70.0 in | Wt 195.0 lb

## 2021-02-08 DIAGNOSIS — F339 Major depressive disorder, recurrent, unspecified: Secondary | ICD-10-CM

## 2021-02-08 DIAGNOSIS — E1165 Type 2 diabetes mellitus with hyperglycemia: Secondary | ICD-10-CM | POA: Diagnosis not present

## 2021-02-08 DIAGNOSIS — E559 Vitamin D deficiency, unspecified: Secondary | ICD-10-CM

## 2021-02-08 DIAGNOSIS — E781 Pure hyperglyceridemia: Secondary | ICD-10-CM | POA: Diagnosis not present

## 2021-02-08 DIAGNOSIS — E876 Hypokalemia: Secondary | ICD-10-CM

## 2021-02-08 DIAGNOSIS — I10 Essential (primary) hypertension: Secondary | ICD-10-CM

## 2021-02-08 DIAGNOSIS — M1612 Unilateral primary osteoarthritis, left hip: Secondary | ICD-10-CM

## 2021-02-08 DIAGNOSIS — M818 Other osteoporosis without current pathological fracture: Secondary | ICD-10-CM | POA: Diagnosis not present

## 2021-02-08 DIAGNOSIS — E1169 Type 2 diabetes mellitus with other specified complication: Secondary | ICD-10-CM

## 2021-02-08 DIAGNOSIS — F411 Generalized anxiety disorder: Secondary | ICD-10-CM

## 2021-02-08 DIAGNOSIS — K219 Gastro-esophageal reflux disease without esophagitis: Secondary | ICD-10-CM | POA: Diagnosis not present

## 2021-02-08 DIAGNOSIS — J410 Simple chronic bronchitis: Secondary | ICD-10-CM

## 2021-02-08 DIAGNOSIS — R300 Dysuria: Secondary | ICD-10-CM

## 2021-02-08 DIAGNOSIS — E663 Overweight: Secondary | ICD-10-CM

## 2021-02-08 DIAGNOSIS — E785 Hyperlipidemia, unspecified: Secondary | ICD-10-CM

## 2021-02-08 LAB — MICROSCOPIC EXAMINATION
RBC, Urine: NONE SEEN /hpf (ref 0–2)
Renal Epithel, UA: NONE SEEN /hpf

## 2021-02-08 LAB — URINALYSIS, COMPLETE
Bilirubin, UA: NEGATIVE
Glucose, UA: NEGATIVE
Ketones, UA: NEGATIVE
Nitrite, UA: NEGATIVE
Protein,UA: NEGATIVE
RBC, UA: NEGATIVE
Specific Gravity, UA: 1.02 (ref 1.005–1.030)
Urobilinogen, Ur: 0.2 mg/dL (ref 0.2–1.0)
pH, UA: 5.5 (ref 5.0–7.5)

## 2021-02-08 LAB — BAYER DCA HB A1C WAIVED: HB A1C (BAYER DCA - WAIVED): 6.2 % (ref <7.0)

## 2021-02-08 MED ORDER — ROSUVASTATIN CALCIUM 40 MG PO TABS: ORAL_TABLET | ORAL | 1 refills | Status: DC

## 2021-02-08 MED ORDER — METFORMIN HCL ER 500 MG PO TB24: 500.0000 mg | ORAL_TABLET | Freq: Two times a day (BID) | ORAL | 1 refills | Status: DC

## 2021-02-08 MED ORDER — TRAMADOL HCL 50 MG PO TABS: ORAL_TABLET | ORAL | 2 refills | Status: DC

## 2021-02-08 MED ORDER — AMLODIPINE BESY-BENAZEPRIL HCL 10-40 MG PO CAPS: 1.0000 | ORAL_CAPSULE | Freq: Every day | ORAL | 1 refills | Status: DC

## 2021-02-08 MED ORDER — NYSTATIN 100000 UNIT/GM EX POWD: Freq: Two times a day (BID) | CUTANEOUS | 5 refills | Status: DC

## 2021-02-08 MED ORDER — CHLORTHALIDONE 25 MG PO TABS: 12.5000 mg | ORAL_TABLET | Freq: Every day | ORAL | 1 refills | Status: DC

## 2021-02-08 MED ORDER — GABAPENTIN 100 MG PO CAPS: 100.0000 mg | ORAL_CAPSULE | Freq: Three times a day (TID) | ORAL | 2 refills | Status: DC

## 2021-02-08 MED ORDER — FENOFIBRATE 145 MG PO TABS: 145.0000 mg | ORAL_TABLET | Freq: Every day | ORAL | 1 refills | Status: DC

## 2021-02-08 MED ORDER — OMEPRAZOLE 40 MG PO CPDR: 40.0000 mg | DELAYED_RELEASE_CAPSULE | Freq: Every day | ORAL | 1 refills | Status: DC

## 2021-02-08 NOTE — Progress Notes (Signed)
Subjective:    Patient ID: Caitlin Mccarthy, female    DOB: 1936-03-12, 85 y.o.   MRN: [id number]   Chief Complaint: Medical Management of Chronic Issues    HPI:  1. Essential hypertension, benign No c/o chest pain, sob or headache. Does not check blood pressure today. BP Readings from Last 3 Encounters:  11/09/20 (!) 143/63  08/10/20 124/62  05/08/20 116/62     2. Pure hypertriglyceridemia Eats whatever her family makes her. She is not able to do any exercise. Lab Results  Component Value Date   CHOL 173 11/08/2020   HDL 44 11/08/2020   LDLCALC 92 11/08/2020   TRIG 216 (H) 11/08/2020   CHOLHDL 3.9 11/08/2020     3. Type 2 diabetes mellitus with hyperglycemia, without long-term current use of insulin (HCC) Fasting blood sugars are running 120-130 most of the time. Lab Results  Component Value Date   HGBA1C 6.2 11/08/2020     4. Dysuria Only slight. Patient just wanted it checked  5. Simple chronic bronchitis (HCC) Is not on any inhalers currently. Denies any SOB.   6. Gastroesophageal reflux disease without esophagitis Takes omperaole as needed. Has symptoms depending on what she eats.  7. Hyperlipidemia due to type 2 diabetes mellitus ([place])  8. Other osteoporosis without current pathological fracture Has chronic back pain  9. Episode of recurrent major depressive disorder, unspecified depression episode severity ([place]) Is doing well. Is currently on no medication. Depression screen [facility] 2/9 02/08/2021 02/02/2021 11/09/2020  Decreased Interest 2 0 0  Down, Depressed, Hopeless _0 PHQ - 2 Score _1 Altered sleeping 2 - 3  Tired, decreased energy 2 - 3  Change in appetite 0 - 1  Feeling bad or failure about yourself  1 - 0  Trouble concentrating 2 - 0  Moving slowly or fidgety/restless 2 - 0  Suicidal thoughts 0 - 0  PHQ-9 Score 13 - 8  Difficult doing work/chores Somewhat difficult - Not difficult at all  Some recent data might be hidden      10. GAD (generalized anxiety disorder) Has anxiety at times. GAD 7 : Generalized Anxiety Score 02/08/2021 11/09/2020 08/10/2020 05/08/2020  Nervous, Anxious, on Edge _2 0  Control/stop worrying _3 0  Worry too much - different things _4 0  Trouble relaxing _5 0  Restless 1 0 3 0  Easily annoyed or irritable _6 0  Afraid - awful might happen _7 0  Total GAD 7 Score _8 0  Anxiety Difficulty Somewhat difficult Not difficult at all - Not difficult at all      11. Hypokalemia No lower ext cramping. Lab Results  Component Value Date   K 5.4 (H) 11/08/2020     12. Overweight (BMI 25.0-29.9) No recent weight changes Wt Readings from Last 3 Encounters:  02/08/21 195 lb (88.5 kg)  02/02/21 198 lb (89.8 kg)  11/09/20 198 lb (89.8 kg)   BMI Readings from Last 3 Encounters:  02/08/21 27.98 kg/m  02/02/21 28.41 kg/m  11/09/20 28.41 kg/m     13. Vitamin D deficiency Is on daily vitamin d supplement    Outpatient Encounter Medications as of 02/08/2021  Medication Sig   Alcohol Swabs (B-D SINGLE USE SWABS REGULAR) PADS Test BS daily and as needed Dx E11.65   amLODipine-benazepril (LOTREL) 10-40 MG capsule Take 1 capsule by mouth daily.   aspirin EC  81 MG tablet Take 1 tablet (81 mg total) by mouth daily.   Baclofen 5 MG TABS Take 5 mg by mouth 3 (three) times daily as needed.   Blood Glucose Calibration (TRUE METRIX LEVEL 1) Low SOLN Use with glucose monitor Dx E11.65   Blood Glucose Monitoring Suppl (TRUE METRIX AIR GLUCOSE METER) w/Device KIT Test BS daily and as needed Dx E11.65   chlorthalidone (HYGROTON) 25 MG tablet Take 0.5 tablets (12.5 mg total) by mouth daily.   cholecalciferol (VITAMIN D3) 25 MCG (1000 UNIT) tablet Take 1,000 Units by mouth daily.   fenofibrate (TRICOR) 145 MG tablet Take 1 tablet (145 mg total) by mouth daily.   fluticasone (FLONASE) 50 MCG/ACT nasal spray Place 2 sprays into both nostrils daily.   gabapentin (NEURONTIN)  100 MG capsule Take 1 capsule (100 mg total) by mouth 3 (three) times daily.   glucose blood (TRUE METRIX BLOOD GLUCOSE TEST) test strip Test BS daily and as needed Dx E11.65   metFORMIN (GLUCOPHAGE-XR) 500 MG 24 hr tablet Take 1 tablet (500 mg total) by mouth in the morning and at bedtime.   methocarbamol (ROBAXIN) 500 MG tablet    NYSTATIN powder APPLY 4 TIMES DAILY   omeprazole (PRILOSEC) 40 MG capsule Take 1 capsule (40 mg total) by mouth daily.   ondansetron (ZOFRAN-ODT) 4 MG disintegrating tablet TAKE 1 TABLET EVERY 8 HOURS AS NEEDED FOR NAUSEA AND VOMITING   polyethylene glycol (MIRALAX / GLYCOLAX) 17 g packet Take 17 g by mouth daily.   PREMARIN vaginal cream Apply 1 application topically daily as needed (irritation).    rosuvastatin (CRESTOR) 40 MG tablet TAKE ONE (1) TABLET EACH DAY   traMADol (ULTRAM) 50 MG tablet TAKE ONE TABLET FOUR TIMES DAILY   TRUEplus Lancets 33G MISC Test BS daily and as needed Dx E11.65   [DISCONTINUED] DULoxetine (CYMBALTA) 60 MG capsule Take 1 capsule (60 mg total) by mouth daily.   No facility-administered encounter medications on file as of 02/08/2021.    Past Surgical History:  Procedure Laterality Date   CATARACT EXTRACTION W/PHACO  06/06/2011   Procedure: CATARACT EXTRACTION PHACO AND INTRAOCULAR LENS PLACEMENT (IOC);  Surgeon: Gemma Payor;  Location: [facility];  Service: Ophthalmology;  Laterality: Right;  CDE=12.77   CATARACT EXTRACTION W/PHACO  06/27/2011   Procedure: CATARACT EXTRACTION PHACO AND INTRAOCULAR LENS PLACEMENT (IOC);  Surgeon: Gemma Payor;  Location: [facility];  Service: Ophthalmology;  Laterality: Left;  CDE:13.96   CYSTOSCOPY WITH URETEROSCOPY AND STENT PLACEMENT Right 04/27/2015   Procedure: CYSTOSCOPY WITH RIGHT URETEROSCOPY, BASKET REMOVAL OF STONE, REMOVAL OF RIGHT NEPHROSTOMY TUBE;  Surgeon: Bjorn Pippin, MD;  Location: [facility];  Service: Urology;  Laterality: Right;   CYSTOSCOPY/URETEROSCOPY/HOLMIUM LASER/STENT  PLACEMENT Left 12/24/2016   Procedure: LEFT URETEROSCOPY WITH HOLMIUM LASER AND STENT PLACEMENT;  Surgeon: Bjorn Pippin, MD;  [facility];  Service: Urology;  Laterality: Left;   EXTRACORPOREAL SHOCK WAVE LITHOTRIPSY  left 04-01-2016;  1980's   FRACTURE SURGERY     HIP ARTHROPLASTY     HOLMIUM LASER APPLICATION Right 04/27/2015   Procedure: HOLMIUM LASER APPLICATION;  Surgeon: Bjorn Pippin, MD;  Location: [facility];  Service: Urology;  Laterality: Right;   KNEE ARTHROPLASTY     KNEE ARTHROSCOPY Bilateral right 2005//  left ?   LOOP RECORDER INSERTION N/A 04/01/2017   Procedure: LOOP RECORDER INSERTION;  Surgeon: Hillis Range, MD;  Location: [facility];  Service: Cardiovascular;  Laterality: N/A;  TOTAL HIP ARTHROPLASTY  10/28/2011   Procedure: TOTAL HIP ARTHROPLASTY;  Surgeon: Gearlean Alf, MD;  Location: [facility];  Service: Orthopedics;  Laterality: Right;   TOTAL KNEE ARTHROPLASTY Bilateral left 03-09-2007//  right 2006   TRANSTHORACIC ECHOCARDIOGRAM  10/06/2006   normal echo,  ef 55-60%   TUBAL LIGATION      Family History  Problem Relation Age of Onset   Kidney disease Mother    Diabetes Mother    Congestive Heart Failure Father    Heart failure Father    Heart disease Brother    Alcohol abuse Brother    Anesthesia problems Neg Hx    Hypotension Neg Hx    Malignant hyperthermia Neg Hx    Pseudochol deficiency Neg Hx     New complaints: None today  Social history: Lives with her husband and her daughter checks on her daily.n/a  Controlled substance contract: n/a     Review of Systems  Constitutional:  Negative for diaphoresis.  Eyes:  Negative for pain.  Respiratory:  Negative for shortness of breath.   Cardiovascular:  Negative for chest pain, palpitations and leg swelling.  Gastrointestinal:  Negative for abdominal pain.  Endocrine: Negative for polydipsia.  Skin:  Negative for rash.  Neurological:  Negative for  dizziness, weakness and headaches.  Hematological:  Does not bruise/bleed easily.  All other systems reviewed and are negative.     Objective:   Physical Exam Vitals and nursing note reviewed.  Constitutional:      General: She is not in acute distress.    Appearance: Normal appearance. She is well-developed.  HENT:     Head: Normocephalic.     Right Ear: Tympanic membrane normal.     Left Ear: Tympanic membrane normal.     Nose: Nose normal.     Mouth/Throat:     Mouth: Mucous membranes are moist.  Eyes:     Pupils: Pupils are equal, round, and reactive to light.  Neck:     Vascular: No carotid bruit or JVD.  Cardiovascular:     Rate and Rhythm: Normal rate and regular rhythm.     Heart sounds: Normal heart sounds.  Pulmonary:     Effort: Pulmonary effort is normal. No respiratory distress.     Breath sounds: Normal breath sounds. No wheezing or rales.  Chest:     Chest wall: No tenderness.  Abdominal:     General: Bowel sounds are normal. There is no distension or abdominal bruit.     Palpations: Abdomen is soft. There is no hepatomegaly, splenomegaly, mass or pulsatile mass.     Tenderness: There is no abdominal tenderness.  Musculoskeletal:        General: Normal range of motion.     Cervical back: Normal range of motion and neck supple.  Lymphadenopathy:     Cervical: No cervical adenopathy.  Skin:    General: Skin is warm and dry.  Neurological:     Mental Status: She is alert and oriented to person, place, and time.     Deep Tendon Reflexes: Reflexes are normal and symmetric.  Psychiatric:        Behavior: Behavior normal.        Thought Content: Thought content normal.        Judgment: Judgment normal.    BP 113/71   Pulse 72   Temp 97.7 F (36.5 C) (Temporal)   Resp 20   Ht _0  (1.778 m)   Wt 195 lb (  88.5 kg)   SpO2 94%   BMI 27.98 kg/m   Urine clear     Assessment & Plan:   JERRIYAH LOUIS comes in today with chief complaint of Medical  Management of Chronic Issues   Diagnosis and orders addressed:  1. Essential hypertension, benign Low sodium diet - CBC with Differential/Platelet - CMP14+EGFR - chlorthalidone (HYGROTON) 25 MG tablet; Take 0.5 tablets (12.5 mg total) by mouth daily.  Dispense: 90 tablet; Refill: 1 - amLODipine-benazepril (LOTREL) 10-40 MG capsule; Take 1 capsule by mouth daily.  Dispense: 90 capsule; Refill: 1  2. Pure hypertriglyceridemia Low fat diet - Lipid panel - fenofibrate (TRICOR) 145 MG tablet; Take 1 tablet (145 mg total) by mouth daily.  Dispense: 90 tablet; Refill: 1 - rosuvastatin (CRESTOR) 40 MG tablet; TAKE ONE (1) TABLET EACH DAY  Dispense: 90 tablet; Refill: 1  3. Type 2 diabetes mellitus with hyperglycemia, without long-term current use of insulin (HCC) Continue to wtahc carbs in diet - Bayer DCA Hb A1c Waived - metFORMIN (GLUCOPHAGE-XR) 500 MG 24 hr tablet; Take 1 tablet (500 mg total) by mouth in the morning and at bedtime.  Dispense: 180 tablet; Refill: 1  4. Dysuria Urine clear - Urinalysis, Complete  5. Simple chronic bronchitis (HCC) Smoking cessation  6. Gastroesophageal reflux disease without esophagitis Avoid spicy foods Do not eat 2 hours prior to bedtime - omeprazole (PRILOSEC) 40 MG capsule; Take 1 capsule (40 mg total) by mouth daily.  Dispense: 90 capsule; Refill: 1  7. Hyperlipidemia due to type 2 diabetes mellitus (HCC) Low fat diet  8. Other osteoporosis without current pathological fracture Weight bearing exercise  9. Episode of recurrent major depressive disorder, unspecified depression episode severity ([place]) Stress management  10. GAD (generalized anxiety disorder)  11. Hypokalemia Labs pending  12. Overweight (BMI 25.0-29.9) Discussed diet and exercise for person with BMI >25 Will recheck weight in 3-6 months  13. Vitamin D deficiency  14. Primary osteoarthritis of left hip Fall prevention - gabapentin (NEURONTIN) 100 MG capsule; Take 1  capsule (100 mg total) by mouth 3 (three) times daily.  Dispense: 90 capsule; Refill: 2 - traMADol (ULTRAM) 50 MG tablet; TAKE ONE TABLET FOUR TIMES DAILY  Dispense: 60 tablet; Refill: 2   Labs pending Health Maintenance reviewed Diet and exercise encouraged  Follow up plan: 6 months   Mary-Margaret Hassell Done, FNP

## 2021-02-08 NOTE — Patient Instructions (Signed)

## 2021-02-08 NOTE — Addendum Note (Signed)
Addended by: Rolena Infante on: 02/08/2021 03:30 PM   Modules accepted: Orders

## 2021-02-09 LAB — CBC WITH DIFFERENTIAL/PLATELET
Basophils Absolute: 0.1 10*3/uL (ref 0.0–0.2)
Basos: 1 %
EOS (ABSOLUTE): 0.1 10*3/uL (ref 0.0–0.4)
Eos: 2 %
Hematocrit: 40.3 % (ref 34.0–46.6)
Hemoglobin: 13.5 g/dL (ref 11.1–15.9)
Immature Grans (Abs): 0 10*3/uL (ref 0.0–0.1)
Immature Granulocytes: 0 %
Lymphocytes Absolute: 3.2 10*3/uL — ABNORMAL HIGH (ref 0.7–3.1)
Lymphs: 47 %
MCH: 31.2 pg (ref 26.6–33.0)
MCHC: 33.5 g/dL (ref 31.5–35.7)
MCV: 93 fL (ref 79–97)
Monocytes Absolute: 0.7 10*3/uL (ref 0.1–0.9)
Monocytes: 10 %
Neutrophils Absolute: 2.6 10*3/uL (ref 1.4–7.0)
Neutrophils: 40 %
Platelets: 223 10*3/uL (ref 150–450)
RBC: 4.33 x10E6/uL (ref 3.77–5.28)
RDW: 12.3 % (ref 11.7–15.4)
WBC: 6.6 10*3/uL (ref 3.4–10.8)

## 2021-02-09 LAB — LIPID PANEL
Chol/HDL Ratio: 3.8 ratio (ref 0.0–4.4)
Cholesterol, Total: 167 mg/dL (ref 100–199)
HDL: 44 mg/dL (ref >39)
LDL Chol Calc (NIH): 88 mg/dL (ref 0–99)
Triglycerides: 205 mg/dL — ABNORMAL HIGH (ref 0–149)
VLDL Cholesterol Cal: 35 mg/dL (ref 5–40)

## 2021-02-09 LAB — CMP14+EGFR
ALT: 14 IU/L (ref 0–32)
AST: 23 IU/L (ref 0–40)
Albumin/Globulin Ratio: 1.6 (ref 1.2–2.2)
Albumin: 4.6 g/dL (ref 3.6–4.6)
Alkaline Phosphatase: 44 IU/L (ref 44–121)
BUN/Creatinine Ratio: 21 (ref 12–28)
BUN: 20 mg/dL (ref 8–27)
Bilirubin Total: 0.3 mg/dL (ref 0.0–1.2)
CO2: 24 mmol/L (ref 20–29)
Calcium: 11.8 mg/dL — ABNORMAL HIGH (ref 8.7–10.3)
Chloride: 100 mmol/L (ref 96–106)
Creatinine, Ser: 0.96 mg/dL (ref 0.57–1.00)
Globulin, Total: 2.8 g/dL (ref 1.5–4.5)
Glucose: 81 mg/dL (ref 65–99)
Potassium: 4.6 mmol/L (ref 3.5–5.2)
Sodium: 137 mmol/L (ref 134–144)
Total Protein: 7.4 g/dL (ref 6.0–8.5)
eGFR: 59 mL/min/{1.73_m2} — ABNORMAL LOW (ref >59)

## 2021-02-10 LAB — URINE CULTURE

## 2021-03-19 DIAGNOSIS — N202 Calculus of kidney with calculus of ureter: Secondary | ICD-10-CM | POA: Diagnosis not present

## 2021-03-19 DIAGNOSIS — N200 Calculus of kidney: Secondary | ICD-10-CM | POA: Diagnosis not present

## 2021-03-19 DIAGNOSIS — N3020 Other chronic cystitis without hematuria: Secondary | ICD-10-CM | POA: Diagnosis not present

## 2021-03-19 DIAGNOSIS — N261 Atrophy of kidney (terminal): Secondary | ICD-10-CM | POA: Diagnosis not present

## 2021-03-19 DIAGNOSIS — K7689 Other specified diseases of liver: Secondary | ICD-10-CM | POA: Diagnosis not present

## 2021-03-19 DIAGNOSIS — R1084 Generalized abdominal pain: Secondary | ICD-10-CM | POA: Diagnosis not present

## 2021-03-19 DIAGNOSIS — I77811 Abdominal aortic ectasia: Secondary | ICD-10-CM | POA: Diagnosis not present

## 2021-05-11 ENCOUNTER — Other Ambulatory Visit: Payer: Self-pay | Admitting: Nurse Practitioner

## 2021-05-11 DIAGNOSIS — R110 Nausea: Secondary | ICD-10-CM

## 2021-05-21 ENCOUNTER — Ambulatory Visit (INDEPENDENT_AMBULATORY_CARE_PROVIDER_SITE_OTHER): Payer: Medicare HMO

## 2021-05-21 ENCOUNTER — Other Ambulatory Visit: Payer: Self-pay

## 2021-05-21 DIAGNOSIS — Z23 Encounter for immunization: Secondary | ICD-10-CM

## 2021-06-09 ENCOUNTER — Other Ambulatory Visit: Payer: Self-pay | Admitting: Nurse Practitioner

## 2021-06-09 DIAGNOSIS — M1612 Unilateral primary osteoarthritis, left hip: Secondary | ICD-10-CM

## 2021-07-09 ENCOUNTER — Encounter: Payer: Self-pay | Admitting: Nurse Practitioner

## 2021-07-09 ENCOUNTER — Ambulatory Visit (INDEPENDENT_AMBULATORY_CARE_PROVIDER_SITE_OTHER): Payer: Medicare HMO | Admitting: Nurse Practitioner

## 2021-07-09 DIAGNOSIS — U071 COVID-19: Secondary | ICD-10-CM | POA: Insufficient documentation

## 2021-07-09 MED ORDER — GUAIFENESIN ER 600 MG PO TB12
600.0000 mg | ORAL_TABLET | Freq: Two times a day (BID) | ORAL | 0 refills | Status: DC
Start: 2021-07-09 — End: 2022-08-30

## 2021-07-09 MED ORDER — MOLNUPIRAVIR EUA 200MG CAPSULE: 4.0000 | ORAL_CAPSULE | Freq: Two times a day (BID) | ORAL | 0 refills | Status: AC

## 2021-07-09 NOTE — Patient Instructions (Signed)
10 Things You Can Do to Manage Your COVID-19 Symptoms at Home ?If you have possible or confirmed COVID-19 ?Stay home except to get medical care. ?Monitor your symptoms carefully. If your symptoms get worse, call your healthcare provider immediately. ?Get rest and stay hydrated. ?If you have a medical appointment, call the healthcare provider ahead of time and tell them that you have or may have COVID-19. ?For medical emergencies, call 911 and notify the dispatch personnel that you have or may have COVID-19. ?Cover your cough and sneezes with a tissue or use the inside of your elbow. ?Wash your hands often with soap and water for at least 20 seconds or clean your hands with an alcohol-based hand sanitizer that contains at least 60% alcohol. ?As much as possible, stay in a specific room and away from other people in your home. Also, you should use a separate bathroom, if available. If you need to be around other people in or outside of the home, wear a mask. ?Avoid sharing personal items with other people in your household, like dishes, towels, and bedding. ?Clean all surfaces that are touched often, like counters, tabletops, and doorknobs. Use household cleaning sprays or wipes according to the label instructions. ?cdc.gov/coronavirus ?01/14/2020 ?This information is not intended to replace advice given to you by your health care provider. Make sure you discuss any questions you have with your health care provider. ?Document Revised: 03/09/2021 Document Reviewed: 03/09/2021 ?Elsevier Patient Education ? 2022 [employer] ? ?

## 2021-07-09 NOTE — Progress Notes (Signed)
° °  Virtual Visit  Note Due to COVID-19 pandemic this visit was conducted virtually. This visit type was conducted due to national recommendations for restrictions regarding the COVID-19 Pandemic (e.g. social distancing, sheltering in place) in an effort to limit this patient's exposure and mitigate transmission in our community. All issues noted in this document were discussed and addressed.  A physical exam was not performed with this format.  I connected with Caitlin Mccarthy on 07/09/21 at 11:13 am  by telephone and verified that I am speaking with the correct person using two identifiers. Caitlin Mccarthy is currently located at home during visit. The provider, Ivy Lynn, NP is located in their office at time of visit.  I discussed the limitations, risks, security and privacy concerns of performing an evaluation and management service by telephone and the availability of in person appointments. I also discussed with the patient that there may be a patient responsible charge related to this service. The patient expressed understanding and agreed to proceed.   History and Present Illness:  URI  This is a new problem. Episode onset: in the past 3 days. The problem has been unchanged. The maximum temperature recorded prior to her arrival was 100.4 - 100.9 F. Associated symptoms include congestion, coughing and a sore throat. Pertinent negatives include no rash. She has tried acetaminophen for the symptoms. The treatment provided mild relief.     Review of Systems  Constitutional:  Positive for fever and malaise/fatigue. Negative for chills.  HENT:  Positive for congestion and sore throat.   Respiratory:  Positive for cough.   Gastrointestinal: Negative.   Genitourinary: Negative.   Skin:  Negative for rash.  All other systems reviewed and are negative.   Observations/Objective: Televisit patient not in distress.  Assessment and Plan: Positive for COVID-19 at home test. Take  meds as prescribed - Use a cool mist humidifier  -Use saline nose sprays frequently -Force fluids -For fever or aches or pains- take Tylenol or ibuprofen. -Guaifenesin 600 mg tablet by mouth every 12 hours. -Molnupirivir RX sent to pharmacy, education provided to patient,   Follow up with worsening unresolved symptoms   Follow Up Instructions: Follow-up with worsening unresolved symptoms.    I discussed the assessment and treatment plan with the patient. The patient was provided an opportunity to ask questions and all were answered. The patient agreed with the plan and demonstrated an understanding of the instructions.   The patient was advised to call back or seek an in-person evaluation if the symptoms worsen or if the condition fails to improve as anticipated.  The above assessment and management plan was discussed with the patient. The patient verbalized understanding of and has agreed to the management plan. Patient is aware to call the clinic if symptoms persist or worsen. Patient is aware when to return to the clinic for a follow-up visit. Patient educated on when it is appropriate to go to the emergency department.   Time call ended:  11:20 am   I provided 8 minutes of  non face-to-face time during this encounter.    Ivy Lynn, NP

## 2021-08-09 ENCOUNTER — Other Ambulatory Visit: Payer: Self-pay | Admitting: Nurse Practitioner

## 2021-08-09 DIAGNOSIS — M1612 Unilateral primary osteoarthritis, left hip: Secondary | ICD-10-CM

## 2021-08-13 ENCOUNTER — Encounter: Payer: Self-pay | Admitting: Nurse Practitioner

## 2021-08-13 ENCOUNTER — Ambulatory Visit (INDEPENDENT_AMBULATORY_CARE_PROVIDER_SITE_OTHER): Payer: Medicare HMO | Admitting: Nurse Practitioner

## 2021-08-13 VITALS — BP 166/77 | HR 63 | Temp 97.1°F | Resp 20 | Ht 70.0 in | Wt 194.0 lb

## 2021-08-13 DIAGNOSIS — Z0289 Encounter for other administrative examinations: Secondary | ICD-10-CM | POA: Diagnosis not present

## 2021-08-13 DIAGNOSIS — Z23 Encounter for immunization: Secondary | ICD-10-CM

## 2021-08-13 DIAGNOSIS — E1169 Type 2 diabetes mellitus with other specified complication: Secondary | ICD-10-CM | POA: Diagnosis not present

## 2021-08-13 DIAGNOSIS — K219 Gastro-esophageal reflux disease without esophagitis: Secondary | ICD-10-CM

## 2021-08-13 DIAGNOSIS — J410 Simple chronic bronchitis: Secondary | ICD-10-CM

## 2021-08-13 DIAGNOSIS — I10 Essential (primary) hypertension: Secondary | ICD-10-CM | POA: Diagnosis not present

## 2021-08-13 DIAGNOSIS — R110 Nausea: Secondary | ICD-10-CM

## 2021-08-13 DIAGNOSIS — E785 Hyperlipidemia, unspecified: Secondary | ICD-10-CM

## 2021-08-13 DIAGNOSIS — R300 Dysuria: Secondary | ICD-10-CM

## 2021-08-13 DIAGNOSIS — N3281 Overactive bladder: Secondary | ICD-10-CM

## 2021-08-13 DIAGNOSIS — F339 Major depressive disorder, recurrent, unspecified: Secondary | ICD-10-CM

## 2021-08-13 DIAGNOSIS — E559 Vitamin D deficiency, unspecified: Secondary | ICD-10-CM

## 2021-08-13 DIAGNOSIS — E781 Pure hyperglyceridemia: Secondary | ICD-10-CM

## 2021-08-13 DIAGNOSIS — E663 Overweight: Secondary | ICD-10-CM

## 2021-08-13 DIAGNOSIS — F17200 Nicotine dependence, unspecified, uncomplicated: Secondary | ICD-10-CM

## 2021-08-13 DIAGNOSIS — E1165 Type 2 diabetes mellitus with hyperglycemia: Secondary | ICD-10-CM

## 2021-08-13 DIAGNOSIS — F411 Generalized anxiety disorder: Secondary | ICD-10-CM

## 2021-08-13 DIAGNOSIS — M159 Polyosteoarthritis, unspecified: Secondary | ICD-10-CM

## 2021-08-13 DIAGNOSIS — M1612 Unilateral primary osteoarthritis, left hip: Secondary | ICD-10-CM

## 2021-08-13 DIAGNOSIS — E876 Hypokalemia: Secondary | ICD-10-CM

## 2021-08-13 DIAGNOSIS — I6930 Unspecified sequelae of cerebral infarction: Secondary | ICD-10-CM | POA: Diagnosis not present

## 2021-08-13 DIAGNOSIS — M150 Primary generalized (osteo)arthritis: Secondary | ICD-10-CM

## 2021-08-13 LAB — BAYER DCA HB A1C WAIVED: HB A1C (BAYER DCA - WAIVED): 6.1 % — ABNORMAL HIGH (ref 4.8–5.6)

## 2021-08-13 LAB — MICROSCOPIC EXAMINATION
Renal Epithel, UA: NONE SEEN /hpf
WBC, UA: >30 /hpf — AB (ref 0–5)

## 2021-08-13 LAB — URINALYSIS, COMPLETE
Bilirubin, UA: NEGATIVE
Glucose, UA: NEGATIVE
Ketones, UA: NEGATIVE
Nitrite, UA: NEGATIVE
Specific Gravity, UA: 1.02 (ref 1.005–1.030)
Urobilinogen, Ur: 0.2 mg/dL (ref 0.2–1.0)
pH, UA: 7 (ref 5.0–7.5)

## 2021-08-13 MED ORDER — FENOFIBRATE 145 MG PO TABS: 145.0000 mg | ORAL_TABLET | Freq: Every day | ORAL | 1 refills | Status: DC

## 2021-08-13 MED ORDER — OMEPRAZOLE 40 MG PO CPDR: 40.0000 mg | DELAYED_RELEASE_CAPSULE | Freq: Every day | ORAL | 1 refills | Status: DC

## 2021-08-13 MED ORDER — ONDANSETRON 4 MG PO TBDP: ORAL_TABLET | ORAL | 3 refills | Status: AC

## 2021-08-13 MED ORDER — AMLODIPINE BESY-BENAZEPRIL HCL 10-40 MG PO CAPS: 1.0000 | ORAL_CAPSULE | Freq: Every day | ORAL | 1 refills | Status: DC

## 2021-08-13 MED ORDER — GABAPENTIN 100 MG PO CAPS: ORAL_CAPSULE | ORAL | 2 refills | Status: DC

## 2021-08-13 MED ORDER — METFORMIN HCL ER 500 MG PO TB24: 500.0000 mg | ORAL_TABLET | Freq: Two times a day (BID) | ORAL | 1 refills | Status: DC

## 2021-08-13 MED ORDER — CHLORTHALIDONE 25 MG PO TABS: 12.5000 mg | ORAL_TABLET | Freq: Every day | ORAL | 1 refills | Status: DC

## 2021-08-13 MED ORDER — TRAMADOL HCL 50 MG PO TABS: 100.0000 mg | ORAL_TABLET | Freq: Two times a day (BID) | ORAL | 2 refills | Status: DC

## 2021-08-13 MED ORDER — ROSUVASTATIN CALCIUM 40 MG PO TABS: ORAL_TABLET | ORAL | 1 refills | Status: DC

## 2021-08-13 MED ORDER — HYDROXYZINE PAMOATE 25 MG PO CAPS: 25.0000 mg | ORAL_CAPSULE | Freq: Two times a day (BID) | ORAL | 3 refills | Status: DC | PRN

## 2021-08-13 NOTE — Progress Notes (Signed)
Subjective:    Patient ID: [name], female    DOB: [birth year], 86 y.o.   MRN: [id number]   Chief Complaint: medical management of chronic issues     HPI:  HALO SHEVLIN is a 86 y.o. who identifies as a female who was assigned female at birth.   Social history: Lives with: husband- daughter comes and checks on her daily Work history: retired   Scientist, forensic in today for follow up of the following chronic medical issues:  1. Essential hypertension, benign No c/o chest pain, sob or headache. Does not check on blood pressure at home. BP Readings from Last 3 Encounters:  02/08/21 113/71  11/09/20 (!) 143/63  08/10/20 124/62     2. Pain management contract signed Pain assessment: Cause of pain- OA Pain location- different joints Pain on scale of 1-10- 7-8/10 Frequency- daily What increases pain-to much activity What makes pain Better-rest helps Effects on ADL - none Any change in general medical condition-none  Current opioids rx- ultram # meds rx- 60 Effectiveness of current meds-none Adverse reactions from pain meds-none Morphine equivalent- 20 MME  Pill count performed-No Last drug screen - 07/23/19 ( high risk q34m moderate risk q666mlow risk yearly ) Urine drug screen today- Yes Was the NCF[place]ansburgeviewed- yes  If yes were their any concerning findings? - no   Overdose risk: 1  Pain contract signed on:08/15/20   3. Simple chronic bronchitis (HCCoatsIs doing well. On no inhalers  4. Gastroesophageal reflux disease without esophagitis Is on prilosec daily and is doing well. Has been burping alot  5. Hyperlipidemia due to type 2 diabetes mellitus (HCElmendorfDoes not really wathc diet and is not able to do much exercise. Lab Results  Component Value Date   CHOL 167 02/08/2021   HDL 44 02/08/2021   LDLCALC 88 02/08/2021   TRIG 205 (H) 02/08/2021   CHOLHDL 3.8 02/08/2021     6. Type 2 diabetes mellitus with hyperglycemia, without long-term current use  of insulin (HCC) Fasting blood sugars are running aorund 120-140. No low blood sugars. Lab Results  Component Value Date   HGBA1C 6.2 02/08/2021     7. Primary osteoarthritis involving multiple joints Has pain in joints all the time. Makes it hard for he rt get around. Uses a walker most of the time.  8. Overactive bladder Says she has to void frequently but is on no meds for this.  9. Late effect of cerebrovascular accident (CVA) Has no residual effects form stroke.  10. Episode of recurrent major depressive disorder, unspecified depression episode severity (HCLe FloreIs currently not on an antidepressant. [facility] 08/13/2021 02/08/2021 02/02/2021  Decreased Interest 2 2 0  Down, Depressed, Hopeless _0 PHQ - 2 Score _1 Altered sleeping 3 2 -  Tired, decreased energy 3 2 -  Change in appetite 2 0 -  Feeling bad or failure about yourself  1 1 -  Trouble concentrating 2 2 -  Moving slowly or fidgety/restless 0 2 -  Suicidal thoughts 1 0 -  PHQ-9 Score 16 13 -  Difficult doing work/chores Somewhat difficult Somewhat difficult -  Some recent data might be hidden     11. Hypokalemia Lab Results  Component Value Date   K 4.6 02/08/2021     12. GAD (generalized anxiety disorder) Stays anxious GAD 7 : Generalized Anxiety Score 08/13/2021 02/08/2021 11/09/2020 08/10/2020  Nervous, Anxious, on Edge _2 1  Control/stop worrying _0 Worry too much - different things _1 Trouble relaxing _2 Restless 2 1 0 3  Easily annoyed or irritable _3 Afraid - awful might happen _4 Total GAD 7 Score _5 Anxiety Difficulty Somewhat difficult Somewhat difficult Not difficult at all -      13. Vitamin D deficiency Is on daily vitamin d supplement  14. Current smoker Smokes less than a pack a day  15. Overweight (BMI 25.0-29.9) No recent weight changes Wt Readings from Last 3 Encounters:  08/13/21 194 lb (88 kg)  02/08/21 195  lb (88.5 kg)  02/02/21 198 lb (89.8 kg)   BMI Readings from Last 3 Encounters:  08/13/21 27.84 kg/m  02/08/21 27.98 kg/m  02/02/21 28.41 kg/m      New complaints: None today  Allergies  Allergen Reactions   Iodinated Contrast Media    Latex    Outpatient Encounter Medications as of 08/13/2021  Medication Sig   Alcohol Swabs (B-D SINGLE USE SWABS REGULAR) PADS Test BS daily and as needed Dx E11.65   amLODipine-benazepril (LOTREL) 10-40 MG capsule Take 1 capsule by mouth daily.   aspirin EC 81 MG tablet Take 1 tablet (81 mg total) by mouth daily.   Baclofen 5 MG TABS Take 5 mg by mouth 3 (three) times daily as needed.   Blood Glucose Calibration (TRUE METRIX LEVEL 1) Low SOLN Use with glucose monitor Dx E11.65   Blood Glucose Monitoring Suppl (TRUE METRIX AIR GLUCOSE METER) w/Device KIT Test BS daily and as needed Dx E11.65   chlorthalidone (HYGROTON) 25 MG tablet Take 0.5 tablets (12.5 mg total) by mouth daily.   cholecalciferol (VITAMIN D3) 25 MCG (1000 UNIT) tablet Take 1,000 Units by mouth daily.   fenofibrate (TRICOR) 145 MG tablet Take 1 tablet (145 mg total) by mouth daily.   fluticasone (FLONASE) 50 MCG/ACT nasal spray Place 2 sprays into both nostrils daily.   gabapentin (NEURONTIN) 100 MG capsule TAKE ONE (1) CAPSULE THREE (3) TIMES EACH DAY   glucose blood (TRUE METRIX BLOOD GLUCOSE TEST) test strip Test BS daily and as needed Dx E11.65   guaiFENesin (MUCINEX) 600 MG 12 hr tablet Take 1 tablet (600 mg total) by mouth 2 (two) times daily.   metFORMIN (GLUCOPHAGE-XR) 500 MG 24 hr tablet Take 1 tablet (500 mg total) by mouth in the morning and at bedtime.   methocarbamol (ROBAXIN) 500 MG tablet    nystatin powder Apply topically 2 (two) times daily.   omeprazole (PRILOSEC) 40 MG capsule Take 1 capsule (40 mg total) by mouth daily.   ondansetron (ZOFRAN-ODT) 4 MG disintegrating tablet TAKE 1 TABLET EVERY 8 HOURS AS NEEDED FOR NAUSEA AND VOMITING   polyethylene glycol  (MIRALAX / GLYCOLAX) 17 g packet Take 17 g by mouth daily.   PREMARIN vaginal cream Apply 1 application topically daily as needed (irritation).    rosuvastatin (CRESTOR) 40 MG tablet TAKE ONE (1) TABLET EACH DAY   traMADol (ULTRAM) 50 MG tablet TAKE ONE TABLET FOUR TIMES DAILY   TRUEplus Lancets 33G MISC Test BS daily and as needed Dx E11.65   No facility-administered encounter medications on file as of 08/13/2021.    Past Surgical History:  Procedure Laterality Date   CATARACT EXTRACTION W/PHACO  06/06/2011   Procedure: CATARACT EXTRACTION PHACO AND INTRAOCULAR LENS PLACEMENT (IOC);  Surgeon: Tonny Branch;  Location: [facility];  Service: Ophthalmology;  Laterality: Right;  CDE=12.77   CATARACT EXTRACTION W/PHACO  06/27/2011   Procedure: CATARACT EXTRACTION PHACO AND INTRAOCULAR LENS PLACEMENT (IOC);  Surgeon: Tonny Branch;  Location: [facility];  Service: Ophthalmology;  Laterality: Left;  CDE:13.96   CYSTOSCOPY WITH URETEROSCOPY AND STENT PLACEMENT Right 04/27/2015   Procedure: CYSTOSCOPY WITH RIGHT URETEROSCOPY, BASKET REMOVAL OF STONE, REMOVAL OF RIGHT NEPHROSTOMY TUBE;  Surgeon: Irine Seal, MD;  Location: [place];  Service: Urology;  Laterality: Right;   CYSTOSCOPY/URETEROSCOPY/HOLMIUM LASER/STENT PLACEMENT Left 12/24/2016   Procedure: LEFT URETEROSCOPY WITH HOLMIUM LASER AND STENT PLACEMENT;  Surgeon: Irine Seal, MD;  Location: [facility];  Service: Urology;  Laterality: Left;   EXTRACORPOREAL SHOCK WAVE LITHOTRIPSY  left 04-01-2016;  1980's   FRACTURE SURGERY     HIP ARTHROPLASTY     HOLMIUM LASER APPLICATION Right 43/32/9518   Procedure: HOLMIUM LASER APPLICATION;  Surgeon: Irine Seal, MD;  Location: [facility];  Service: Urology;  Laterality: Right;   KNEE ARTHROPLASTY     KNEE ARTHROSCOPY Bilateral right 2005//  left ?   LOOP RECORDER INSERTION N/A 04/01/2017   Procedure: LOOP RECORDER INSERTION;  Surgeon: Thompson Grayer, MD;  Location: [place] [facility];  Service: Cardiovascular;  Laterality: N/A;   TOTAL HIP ARTHROPLASTY  10/28/2011   Procedure: TOTAL HIP ARTHROPLASTY;  Surgeon: Gearlean Alf, MD;  Location: [facility];  Service: Orthopedics;  Laterality: Right;   TOTAL KNEE ARTHROPLASTY Bilateral left 03-09-2007//  right 2006   TRANSTHORACIC ECHOCARDIOGRAM  10/06/2006   normal echo,  ef 55-60%   TUBAL LIGATION      Family History  Problem Relation Age of Onset   Kidney disease Mother    Diabetes Mother    Congestive Heart Failure Father    Heart failure Father    Heart disease Brother    Alcohol abuse Brother    Anesthesia problems Neg Hx    Hypotension Neg Hx    Malignant hyperthermia Neg Hx    Pseudochol deficiency Neg Hx         Review of Systems  Constitutional:  Negative for diaphoresis.  Eyes:  Negative for pain.  Respiratory:  Negative for shortness of breath.   Cardiovascular:  Negative for chest pain, palpitations and leg swelling.  Gastrointestinal:  Negative for abdominal pain.  Endocrine: Negative for polydipsia.  Skin:  Negative for rash.  Neurological:  Negative for dizziness, weakness and headaches.  Hematological:  Does not bruise/bleed easily.  All other systems reviewed and are negative.     Objective:   Physical Exam Vitals and nursing note reviewed.  Constitutional:      General: She is not in acute distress.    Appearance: Normal appearance. She is well-developed.  HENT:     Head: Normocephalic.     Right Ear: Tympanic membrane normal.     Left Ear: Tympanic membrane normal.     Nose: Nose normal.     Mouth/Throat:     Mouth: Mucous membranes are moist.  Eyes:     Pupils: Pupils are equal, round, and reactive to light.  Neck:     Vascular: No carotid bruit or JVD.  Cardiovascular:     Rate and Rhythm: Normal rate and regular rhythm.     Heart sounds: Normal heart sounds.  Pulmonary:     Effort: Pulmonary effort is normal. No respiratory distress.     Breath sounds:  Normal breath sounds. No wheezing or rales.  Chest:     Chest wall: No tenderness.  Abdominal:     General: Bowel sounds are normal. There is no distension or abdominal bruit.     Palpations: Abdomen is soft. There is no hepatomegaly, splenomegaly, mass or pulsatile mass.     Tenderness: There is no abdominal tenderness.  Musculoskeletal:        General: Normal range of motion.     Cervical back: Normal range of motion and neck supple.  Lymphadenopathy:     Cervical: No cervical adenopathy.  Skin:    General: Skin is warm and dry.  Neurological:     Mental Status: She is alert and oriented to person, place, and time.     Deep Tendon Reflexes: Reflexes are normal and symmetric.  Psychiatric:        Behavior: Behavior normal.        Thought Content: Thought content normal.        Judgment: Judgment normal.    BP (!) 166/77    Pulse 63    Temp (!) 97.1 F (36.2 C) (Temporal)    Resp 20    Ht $R'5\' 10"'hc$  (1.778 m)    Wt 194 lb (88 kg)    SpO2 93%    BMI 27.84 kg/m   HGBa1c 6.1     Assessment & Plan:  KIRANDEEP FARISS comes in today with chief complaint of Medical Management of Chronic Issues   Diagnosis and orders addressed:  1. Essential hypertension, benign Low sodium diet - CBC with Differential/Platelet - CMP14+EGFR - chlorthalidone (HYGROTON) 25 MG tablet; Take 0.5 tablets (12.5 mg total) by mouth daily.  Dispense: 90 tablet; Refill: 1 - amLODipine-benazepril (LOTREL) 10-40 MG capsule; Take 1 capsule by mouth daily.  Dispense: 90 capsule; Refill: 1  2. Pain management contract signed Ultram refilled - ToxASSURE Select 13 (MW), Urine  3. Simple chronic bronchitis ([place])  4. Gastroesophageal reflux disease without esophagitis Avoid spicy foods Do not eat 2 hours prior to bedtime - omeprazole (PRILOSEC) 40 MG capsule; Take 1 capsule (40 mg total) by mouth daily.  Dispense: 90 capsule; Refill: 1  5. Hyperlipidemia due to type 2 diabetes mellitus (HCC) Low fat diet -  Lipid panel  6. Type 2 diabetes mellitus with hyperglycemia, without long-term current use of insulin (HCC) Continue to watch carbs in diet - Bayer DCA Hb A1c Waived - metFORMIN (GLUCOPHAGE-XR) 500 MG 24 hr tablet; Take 1 tablet (500 mg total) by mouth in the morning and at bedtime.  Dispense: 180 tablet; Refill: 1  7. Primary osteoarthritis involving multiple joints Wants to see rheumatology - traMADol (ULTRAM) 50 MG tablet; Take 2 tablets (100 mg total) by mouth 2 (two) times daily. TAKE ONE TABLET FOUR TIMES DAILY  Dispense: 60 tablet; Refill: 2 - Ambulatory referral to Rheumatology  8. Overactive bladder  9. Late effect of cerebrovascular accident (CVA)  22. Episode of recurrent major depressive disorder, unspecified depression episode severity ([place]) Stress manageemnt  11. Hypokalemia Labs pending  12. GAD (generalized anxiety disorder) Stress management - hydrOXYzine (VISTARIL) 25 MG capsule; Take 1 capsule (25 mg total) by mouth 2 (two) times daily as needed.  Dispense: 60 capsule; Refill: 3  13. Vitamin D deficiency Continue daily vitamin d suppleemnt  70. Current smoker Smoking cessation  15. Overweight (BMI 25.0-29.9) Discussed diet and exercise for person with BMI >25 Will recheck weight in 3-6 months   16. Dysuria Culture ordered - Urinalysis, Complete  17. Nausea - ondansetron (ZOFRAN-ODT) 4  MG disintegrating tablet; TAKE 1 TABLET EVERY 8 HOURS AS NEEDED FOR NAUSEA AND VOMITING  Dispense: 20 tablet; Refill: 3  18. Primary osteoarthritis of left hip - gabapentin (NEURONTIN) 100 MG capsule; TAKE ONE (1) CAPSULE THREE (3) TIMES EACH DAY  Dispense: 90 capsule; Refill: 2  19. Pure hypertriglyceridemia Low fat diet - fenofibrate (TRICOR) 145 MG tablet; Take 1 tablet (145 mg total) by mouth daily.  Dispense: 90 tablet; Refill: 1 - rosuvastatin (CRESTOR) 40 MG tablet; TAKE ONE (1) TABLET EACH DAY  Dispense: 90 tablet; Refill: 1   Labs pending Health  Maintenance reviewed Diet and exercise encouraged  Follow up plan:  3 months   Mary-Margaret Hassell Done, FNP

## 2021-08-13 NOTE — Addendum Note (Signed)
Addended by: Rolena Infante on: 08/13/2021 04:32 PM   Modules accepted: Orders

## 2021-08-13 NOTE — Patient Instructions (Signed)

## 2021-08-14 LAB — CMP14+EGFR
ALT: 15 IU/L (ref 0–32)
AST: 22 IU/L (ref 0–40)
Albumin/Globulin Ratio: 1.8 (ref 1.2–2.2)
Albumin: 4.9 g/dL — ABNORMAL HIGH (ref 3.6–4.6)
Alkaline Phosphatase: 50 IU/L (ref 44–121)
BUN/Creatinine Ratio: 27 (ref 12–28)
BUN: 23 mg/dL (ref 8–27)
Bilirubin Total: 0.3 mg/dL (ref 0.0–1.2)
CO2: 25 mmol/L (ref 20–29)
Calcium: 11.5 mg/dL — ABNORMAL HIGH (ref 8.7–10.3)
Chloride: 101 mmol/L (ref 96–106)
Creatinine, Ser: 0.85 mg/dL (ref 0.57–1.00)
Globulin, Total: 2.8 g/dL (ref 1.5–4.5)
Glucose: 149 mg/dL — ABNORMAL HIGH (ref 70–99)
Potassium: 5 mmol/L (ref 3.5–5.2)
Sodium: 139 mmol/L (ref 134–144)
Total Protein: 7.7 g/dL (ref 6.0–8.5)
eGFR: 68 mL/min/{1.73_m2} (ref >59)

## 2021-08-14 LAB — CBC WITH DIFFERENTIAL/PLATELET
Basophils Absolute: 0 10*3/uL (ref 0.0–0.2)
Basos: 1 %
EOS (ABSOLUTE): 0.1 10*3/uL (ref 0.0–0.4)
Eos: 2 %
Hematocrit: 42.7 % (ref 34.0–46.6)
Hemoglobin: 14.4 g/dL (ref 11.1–15.9)
Immature Grans (Abs): 0 10*3/uL (ref 0.0–0.1)
Immature Granulocytes: 0 %
Lymphocytes Absolute: 2.9 10*3/uL (ref 0.7–3.1)
Lymphs: 44 %
MCH: 31.1 pg (ref 26.6–33.0)
MCHC: 33.7 g/dL (ref 31.5–35.7)
MCV: 92 fL (ref 79–97)
Monocytes Absolute: 0.5 10*3/uL (ref 0.1–0.9)
Monocytes: 9 %
Neutrophils Absolute: 2.8 10*3/uL (ref 1.4–7.0)
Neutrophils: 44 %
Platelets: 221 10*3/uL (ref 150–450)
RBC: 4.63 x10E6/uL (ref 3.77–5.28)
RDW: 12.4 % (ref 11.7–15.4)
WBC: 6.4 10*3/uL (ref 3.4–10.8)

## 2021-08-14 LAB — LIPID PANEL
Chol/HDL Ratio: 4 ratio (ref 0.0–4.4)
Cholesterol, Total: 179 mg/dL (ref 100–199)
HDL: 45 mg/dL (ref >39)
LDL Chol Calc (NIH): 91 mg/dL (ref 0–99)
Triglycerides: 256 mg/dL — ABNORMAL HIGH (ref 0–149)
VLDL Cholesterol Cal: 43 mg/dL — ABNORMAL HIGH (ref 5–40)

## 2021-08-17 LAB — TOXASSURE SELECT 13 (MW), URINE

## 2021-08-27 ENCOUNTER — Telehealth: Payer: Self-pay | Admitting: Nurse Practitioner

## 2021-08-27 NOTE — Telephone Encounter (Signed)
Pt's daughter aware culture wasn't ran. Pt isn't symptomatic but in the past has had UTI's without symptoms and wants to know if she can come by and leave a sample for culture.

## 2021-08-27 NOTE — Telephone Encounter (Signed)
Sounds good

## 2021-08-28 ENCOUNTER — Other Ambulatory Visit: Payer: Self-pay | Admitting: *Deleted

## 2021-08-28 DIAGNOSIS — R300 Dysuria: Secondary | ICD-10-CM

## 2021-08-28 NOTE — Telephone Encounter (Signed)
Daughter aware and order entered

## 2021-08-29 ENCOUNTER — Other Ambulatory Visit: Payer: Medicare HMO

## 2021-08-29 DIAGNOSIS — R300 Dysuria: Secondary | ICD-10-CM | POA: Diagnosis not present

## 2021-09-02 LAB — URINE CULTURE

## 2021-09-05 ENCOUNTER — Telehealth: Payer: Self-pay | Admitting: Nurse Practitioner

## 2021-09-05 NOTE — Telephone Encounter (Signed)
Please review urine culture seen you  ?

## 2021-09-05 NOTE — Telephone Encounter (Signed)
The urine culture from March 1 was inconclusive/negative.  If she is still having symptoms that should be recollected. ?

## 2021-09-05 NOTE — Telephone Encounter (Signed)
Please call patients daughter Rojelio Brenner) with urine results.  ?

## 2021-09-05 NOTE — Telephone Encounter (Signed)
DAUGHTER AWARE

## 2021-11-02 ENCOUNTER — Ambulatory Visit: Payer: Medicare HMO | Admitting: Nurse Practitioner

## 2021-11-02 DIAGNOSIS — N3000 Acute cystitis without hematuria: Secondary | ICD-10-CM | POA: Diagnosis not present

## 2021-11-02 DIAGNOSIS — R5081 Fever presenting with conditions classified elsewhere: Secondary | ICD-10-CM | POA: Diagnosis not present

## 2021-11-02 DIAGNOSIS — N200 Calculus of kidney: Secondary | ICD-10-CM | POA: Diagnosis not present

## 2021-11-14 ENCOUNTER — Ambulatory Visit: Payer: Medicare HMO | Admitting: Nurse Practitioner

## 2021-11-14 DIAGNOSIS — M1612 Unilateral primary osteoarthritis, left hip: Secondary | ICD-10-CM | POA: Diagnosis not present

## 2021-11-14 DIAGNOSIS — N261 Atrophy of kidney (terminal): Secondary | ICD-10-CM | POA: Diagnosis not present

## 2021-11-14 DIAGNOSIS — K7689 Other specified diseases of liver: Secondary | ICD-10-CM | POA: Diagnosis not present

## 2021-11-14 DIAGNOSIS — N200 Calculus of kidney: Secondary | ICD-10-CM | POA: Diagnosis not present

## 2021-11-16 DIAGNOSIS — R8271 Bacteriuria: Secondary | ICD-10-CM | POA: Diagnosis not present

## 2021-11-16 DIAGNOSIS — N3020 Other chronic cystitis without hematuria: Secondary | ICD-10-CM | POA: Diagnosis not present

## 2021-11-22 ENCOUNTER — Ambulatory Visit (INDEPENDENT_AMBULATORY_CARE_PROVIDER_SITE_OTHER): Payer: Medicare HMO | Admitting: Nurse Practitioner

## 2021-11-22 ENCOUNTER — Encounter: Payer: Self-pay | Admitting: Nurse Practitioner

## 2021-11-22 VITALS — BP 142/78 | HR 73 | Temp 97.3°F | Resp 20 | Ht 70.0 in | Wt 203.0 lb

## 2021-11-22 DIAGNOSIS — M159 Polyosteoarthritis, unspecified: Secondary | ICD-10-CM

## 2021-11-22 DIAGNOSIS — E785 Hyperlipidemia, unspecified: Secondary | ICD-10-CM | POA: Diagnosis not present

## 2021-11-22 DIAGNOSIS — F339 Major depressive disorder, recurrent, unspecified: Secondary | ICD-10-CM

## 2021-11-22 DIAGNOSIS — M818 Other osteoporosis without current pathological fracture: Secondary | ICD-10-CM

## 2021-11-22 DIAGNOSIS — G8929 Other chronic pain: Secondary | ICD-10-CM

## 2021-11-22 DIAGNOSIS — E663 Overweight: Secondary | ICD-10-CM

## 2021-11-22 DIAGNOSIS — F411 Generalized anxiety disorder: Secondary | ICD-10-CM | POA: Diagnosis not present

## 2021-11-22 DIAGNOSIS — J410 Simple chronic bronchitis: Secondary | ICD-10-CM | POA: Diagnosis not present

## 2021-11-22 DIAGNOSIS — E1165 Type 2 diabetes mellitus with hyperglycemia: Secondary | ICD-10-CM | POA: Diagnosis not present

## 2021-11-22 DIAGNOSIS — I6930 Unspecified sequelae of cerebral infarction: Secondary | ICD-10-CM

## 2021-11-22 DIAGNOSIS — N3281 Overactive bladder: Secondary | ICD-10-CM | POA: Diagnosis not present

## 2021-11-22 DIAGNOSIS — E1169 Type 2 diabetes mellitus with other specified complication: Secondary | ICD-10-CM

## 2021-11-22 DIAGNOSIS — I10 Essential (primary) hypertension: Secondary | ICD-10-CM

## 2021-11-22 DIAGNOSIS — E876 Hypokalemia: Secondary | ICD-10-CM | POA: Diagnosis not present

## 2021-11-22 DIAGNOSIS — E559 Vitamin D deficiency, unspecified: Secondary | ICD-10-CM

## 2021-11-22 LAB — BAYER DCA HB A1C WAIVED: HB A1C (BAYER DCA - WAIVED): 6.2 % — ABNORMAL HIGH (ref 4.8–5.6)

## 2021-11-22 MED ORDER — TRAMADOL HCL 50 MG PO TABS: 100.0000 mg | ORAL_TABLET | Freq: Two times a day (BID) | ORAL | 2 refills | Status: DC

## 2021-11-22 NOTE — Progress Notes (Addendum)
Subjective:    Patient ID: Caitlin Mccarthy, female    DOB: 18-Jan-1936, 86 y.o.   MRN: [id number]   Chief Complaint: Medical Management of Chronic Issues    HPI:  Caitlin Mccarthy is a 86 y.o. who identifies as a female who was assigned female at birth.   Social history: Lives with: lives with husband- her daughters check on them daily Work history: retired   Scientist, forensic in today for follow up of the following chronic medical issues:  1. Essential hypertension, benign No c/o chest pain, sob or headaches. [name]. BP Readings from Last 3 Encounters:  11/22/21 (!) 162/69  08/13/21 (!) 166/77  02/08/21 113/71     2. Hyperlipidemia due to type 2 diabetes mellitus ([place]) Eats whatever her family brings her. Is not able to do much exercise. Lab Results  Component Value Date   CHOL 179 08/13/2021   HDL 45 08/13/2021   LDLCALC 91 08/13/2021   TRIG 256 (H) 08/13/2021   CHOLHDL 4.0 08/13/2021     3. Type 2 diabetes mellitus with hyperglycemia, without long-term current use of insulin (HCC) Fasting blood sugars havebeen in 130-140's. No low blood sugars Lab Results  Component Value Date   HGBA1C 6.1 (H) 08/13/2021     4. Simple chronic bronchitis (HCC) Is on no inhalers. Denies cough or SOB  5. Overactive bladder No current issues  6. Late effect of cerebrovascular accident (CVA) Has some weakness but overall is doing well.  7. Hypokalemia No muscle cramps Lab Results  Component Value Date   K 5.0 08/13/2021     8. Episode of recurrent major depressive disorder, unspecified depression episode severity ([name]) Is on prescription meds. Says she is doing well.    11/22/2021   12:11 PM 08/13/2021    2:13 PM 02/08/2021    2:33 PM  Depression screen PHQ 2/9  Decreased Interest _0 Down, Depressed, Hopeless _1 PHQ - 2 Score _2 Altered sleeping _3 Tired, decreased energy _4 Change in appetite 0 2 0  Feeling bad or  failure about yourself  _5 Trouble concentrating _6 Moving slowly or fidgety/restless 1 0 2  Suicidal thoughts 0 1 0  PHQ-9 Score _7 Difficult doing work/chores Not difficult at all Somewhat difficult Somewhat difficult     9. GAD (generalized anxiety disorder) Has hydroxyzine to take as needed    11/22/2021   12:12 PM 08/13/2021    2:13 PM 02/08/2021    2:33 PM 11/09/2020    2:00 PM  GAD 7 : Generalized Anxiety Score  Nervous, Anxious, on Edge _8 Control/stop worrying _9 Worry too much - different things _10 Trouble relaxing _11 Restless _12 0  Easily annoyed or irritable _13 Afraid - awful might happen _14 Total GAD 7 Score _15 Anxiety Difficulty Not difficult at all Somewhat difficult Somewhat difficult Not difficult at all      10. Other chronic pain Pain assessment: Cause of pain- arthritis Pain location- bil knees Pain on scale of 1-10- 8/10 Frequency- daily What increases pain-walking or standing What makes pain Better-rest helps Effects on ADL - none Any change in general medical condition-none  Current opioids  rx- ultram # meds rx- 60 Effectiveness of current meds-helps Adverse reactions from pain meds-none Morphine equivalent- 20 MME  Pill count performed-No Last drug screen - 08/13/21 ( high risk q72m moderate risk q615mlow risk yearly ) Urine drug screen today- No [place]  If yes were their any concerning findings? - no   Overdose risk: 1   Pain contract signed on:08/14/21   11. Vitamin D deficiency Is on daily vitamin d supplement  12. Other osteoporosis without current pathological fracture Last dexascan was done in 2017- refuses today  13. Overweight (BMI 25.0-29.9) Weight is up 9 lbs Wt Readings from Last 3 Encounters:  11/22/21 203 lb (92.1 kg)  08/13/21 194 lb (88 kg)  02/08/21 195 lb (88.5 kg)   BMI Readings from Last 3 Encounters:  11/22/21 29.13  kg/m  08/13/21 27.84 kg/m  02/08/21 27.98 kg/m      New complaints: None today  Allergies  Allergen Reactions   Iodinated Contrast Media    Latex    Outpatient Encounter Medications as of 11/22/2021  Medication Sig   Alcohol Swabs (B-D SINGLE USE SWABS REGULAR) PADS Test BS daily and as needed Dx E11.65   amLODipine-benazepril (LOTREL) 10-40 MG capsule Take 1 capsule by mouth daily.   aspirin EC 81 MG tablet Take 1 tablet (81 mg total) by mouth daily.   Baclofen 5 MG TABS Take 5 mg by mouth 3 (three) times daily as needed.   Blood Glucose Calibration (TRUE METRIX LEVEL 1) Low SOLN Use with glucose monitor Dx E11.65   Blood Glucose Monitoring Suppl (TRUE METRIX AIR GLUCOSE METER) w/Device KIT Test BS daily and as needed Dx E11.65   chlorthalidone (HYGROTON) 25 MG tablet Take 0.5 tablets (12.5 mg total) by mouth daily.   cholecalciferol (VITAMIN D3) 25 MCG (1000 UNIT) tablet Take 1,000 Units by mouth daily.   fenofibrate (TRICOR) 145 MG tablet Take 1 tablet (145 mg total) by mouth daily.   fluticasone (FLONASE) 50 MCG/ACT nasal spray Place 2 sprays into both nostrils daily.   gabapentin (NEURONTIN) 100 MG capsule TAKE ONE (1) CAPSULE THREE (3) TIMES EACH DAY   glucose blood (TRUE METRIX BLOOD GLUCOSE TEST) test strip Test BS daily and as needed Dx E11.65   guaiFENesin (MUCINEX) 600 MG 12 hr tablet Take 1 tablet (600 mg total) by mouth 2 (two) times daily.   hydrOXYzine (VISTARIL) 25 MG capsule Take 1 capsule (25 mg total) by mouth 2 (two) times daily as needed.   metFORMIN (GLUCOPHAGE-XR) 500 MG 24 hr tablet Take 1 tablet (500 mg total) by mouth in the morning and at bedtime.   methocarbamol (ROBAXIN) 500 MG tablet    nystatin powder Apply topically 2 (two) times daily.   omeprazole (PRILOSEC) 40 MG capsule Take 1 capsule (40 mg total) by mouth daily.   ondansetron (ZOFRAN-ODT) 4 MG disintegrating tablet TAKE 1 TABLET EVERY 8 HOURS AS NEEDED FOR NAUSEA AND VOMITING   polyethylene  glycol (MIRALAX / GLYCOLAX) 17 g packet Take 17 g by mouth daily.   PREMARIN vaginal cream Apply 1 application topically daily as needed (irritation).    rosuvastatin (CRESTOR) 40 MG tablet TAKE ONE (1) TABLET EACH DAY   traMADol (ULTRAM) 50 MG tablet Take 2 tablets (100 mg total) by mouth 2 (two) times daily. TAKE ONE TABLET FOUR TIMES DAILY   TRUEplus Lancets 33G MISC Test BS daily and as needed Dx E11.65   No facility-administered encounter medications on file as of  11/22/2021.    Past Surgical History:  Procedure Laterality Date   CATARACT EXTRACTION W/PHACO  06/06/2011   Procedure: CATARACT EXTRACTION PHACO AND INTRAOCULAR LENS PLACEMENT (IOC);  Surgeon: Tonny Branch;  Location: [facility];  Service: Ophthalmology;  Laterality: Right;  CDE=12.77   CATARACT EXTRACTION W/PHACO  06/27/2011   Procedure: CATARACT EXTRACTION PHACO AND INTRAOCULAR LENS PLACEMENT (IOC);  Surgeon: Tonny Branch;  Location: [facility];  Service: Ophthalmology;  Laterality: Left;  CDE:13.96   CYSTOSCOPY WITH URETEROSCOPY AND STENT PLACEMENT Right 04/27/2015   Procedure: CYSTOSCOPY WITH RIGHT URETEROSCOPY, BASKET REMOVAL OF STONE, REMOVAL OF RIGHT NEPHROSTOMY TUBE;  Surgeon: Irine Seal, MD;  Location: [facility];  Service: Urology;  Laterality: Right;   CYSTOSCOPY/URETEROSCOPY/HOLMIUM LASER/STENT PLACEMENT Left 12/24/2016   Procedure: LEFT URETEROSCOPY WITH HOLMIUM LASER AND STENT PLACEMENT;  Surgeon: Irine Seal, MD;  Location: [facility];  Service: Urology;  Laterality: Left;   EXTRACORPOREAL SHOCK WAVE LITHOTRIPSY  left 04-01-2016;  1980's   FRACTURE SURGERY     HIP ARTHROPLASTY     HOLMIUM LASER APPLICATION Right 77/93/9030   Procedure: HOLMIUM LASER APPLICATION;  Surgeon: Irine Seal, MD;  Location: [facility];  Service: Urology;  Laterality: Right;   KNEE ARTHROPLASTY     KNEE ARTHROSCOPY Bilateral right 2005//  left ?   LOOP RECORDER INSERTION N/A 04/01/2017   Procedure: LOOP  RECORDER INSERTION;  Surgeon: Thompson Grayer, MD;  Location: [facility];  Service: Cardiovascular;  Laterality: N/A;   TOTAL HIP ARTHROPLASTY  10/28/2011   Procedure: TOTAL HIP ARTHROPLASTY;  Surgeon: Gearlean Alf, MD;  Location: [facility];  Service: Orthopedics;  Laterality: Right;   TOTAL KNEE ARTHROPLASTY Bilateral left 03-09-2007//  right 2006   TRANSTHORACIC ECHOCARDIOGRAM  10/06/2006   normal echo,  ef 55-60%   TUBAL LIGATION      Family History  Problem Relation Age of Onset   Kidney disease Mother    Diabetes Mother    Congestive Heart Failure Father    Heart failure Father    Heart disease Brother    Alcohol abuse Brother    Anesthesia problems Neg Hx    Hypotension Neg Hx    Malignant hyperthermia Neg Hx    Pseudochol deficiency Neg Hx          Review of Systems  Constitutional:  Negative for diaphoresis.  Eyes:  Negative for pain.  Respiratory:  Negative for shortness of breath.   Cardiovascular:  Negative for chest pain, palpitations and leg swelling.  Gastrointestinal:  Negative for abdominal pain.  Endocrine: Negative for polydipsia.  Skin:  Negative for rash.  Neurological:  Negative for dizziness, weakness and headaches.  Hematological:  Does not bruise/bleed easily.  All other systems reviewed and are negative.     Objective:   Physical Exam Vitals and nursing note reviewed.  Constitutional:      General: She is not in acute distress.    Appearance: Normal appearance. She is well-developed.  HENT:     Head: Normocephalic.     Right Ear: Tympanic membrane normal.     Left Ear: Tympanic membrane normal.     Nose: Nose normal.     Mouth/Throat:     Mouth: Mucous membranes are moist.  Eyes:     Pupils: Pupils are equal, round, and reactive to light.  Neck:     Vascular: No carotid bruit or JVD.  Cardiovascular:     Rate and Rhythm: Normal rate and regular rhythm.  Heart sounds: Murmur (2/6 systolic) heard.  Pulmonary:     Effort:  Pulmonary effort is normal. No respiratory distress.     Breath sounds: Normal breath sounds. No wheezing or rales.  Chest:     Chest wall: No tenderness.  Abdominal:     General: Bowel sounds are normal. There is no distension or abdominal bruit.     Palpations: Abdomen is soft. There is no hepatomegaly, splenomegaly, mass or pulsatile mass.     Tenderness: There is no abdominal tenderness.  Musculoskeletal:        General: Normal range of motion.     Cervical back: Normal range of motion and neck supple.     Comments: using a walker to get around  Lymphadenopathy:     Cervical: No cervical adenopathy.  Skin:    General: Skin is warm and dry.  Neurological:     Mental Status: She is alert and oriented to person, place, and time.     Deep Tendon Reflexes: Reflexes are normal and symmetric.  Psychiatric:        Behavior: Behavior normal.        Thought Content: Thought content normal.        Judgment: Judgment normal.    BP (!) 142/78   Pulse 73   Temp (!) 97.3 F (36.3 C) (Temporal)   Resp 20   Ht _0  (1.778 m)   Wt 203 lb (92.1 kg)   SpO2 94%   BMI 29.13 kg/m   Hgba1c 6.2%       Assessment & Plan:   Caitlin Mccarthy comes in today with chief complaint of Medical Management of Chronic Issues   Diagnosis and orders addressed:  1. Essential hypertension, benign Low sodium diet - CBC with Differential/Platelet - CMP14+EGFR  2. Hyperlipidemia due to type 2 diabetes mellitus (HCC) Low fat diet - Lipid panel  3. Type 2 diabetes mellitus with hyperglycemia, without long-term current use of insulin (HCC) Continue to watch carbs in diet - Bayer DCA Hb A1c Waived  4. Simple chronic bronchitis ([place])  5. Overactive bladder Report any issues  6. Late effect of cerebrovascular accident (CVA) Fall prevention  7. Hypokalemia Labs pending  8. Episode of recurrent major depressive disorder, unspecified depression episode severity ([place]) Stress  management  9. GAD (generalized anxiety disorder)  10. Other chronic pain Ultram refilled  11. Vitamin D deficiency Continue daily vitamin d   12. Other osteoporosis without current pathological fracture Weight bearing exercises when can tolerate  13. Overweight (BMI 25.0-29.9) Discussed diet and exercise for person with BMI >25 Will recheck weight in 3-6 months   14. Primary osteoarthritis involving multiple joints  - traMADol (ULTRAM) 50 MG tablet; Take 2 tablets (100 mg total) by mouth 2 (two) times daily. TAKE ONE TABLET FOUR TIMES DAILY  Dispense: 60 tablet; Refill: 2   Labs pending Health Maintenance reviewed Diet and exercise encouraged  Follow up plan: 3 months   Mary-Margaret Hassell Done, FNP

## 2021-11-22 NOTE — Patient Instructions (Signed)

## 2021-11-23 LAB — CMP14+EGFR
ALT: 15 IU/L (ref 0–32)
AST: 24 IU/L (ref 0–40)
Albumin/Globulin Ratio: 1.7 (ref 1.2–2.2)
Albumin: 4.6 g/dL (ref 3.6–4.6)
Alkaline Phosphatase: 51 IU/L (ref 44–121)
BUN/Creatinine Ratio: 29 — ABNORMAL HIGH (ref 12–28)
BUN: 28 mg/dL — ABNORMAL HIGH (ref 8–27)
Bilirubin Total: 0.3 mg/dL (ref 0.0–1.2)
CO2: 26 mmol/L (ref 20–29)
Calcium: 11.6 mg/dL — ABNORMAL HIGH (ref 8.7–10.3)
Chloride: 101 mmol/L (ref 96–106)
Creatinine, Ser: 0.96 mg/dL (ref 0.57–1.00)
Globulin, Total: 2.7 g/dL (ref 1.5–4.5)
Glucose: 130 mg/dL — ABNORMAL HIGH (ref 70–99)
Potassium: 5.1 mmol/L (ref 3.5–5.2)
Sodium: 139 mmol/L (ref 134–144)
Total Protein: 7.3 g/dL (ref 6.0–8.5)
eGFR: 59 mL/min/{1.73_m2} — ABNORMAL LOW (ref >59)

## 2021-11-23 LAB — CBC WITH DIFFERENTIAL/PLATELET
Basophils Absolute: 0.1 10*3/uL (ref 0.0–0.2)
Basos: 1 %
EOS (ABSOLUTE): 0.2 10*3/uL (ref 0.0–0.4)
Eos: 3 %
Hematocrit: 40.2 % (ref 34.0–46.6)
Hemoglobin: 13.5 g/dL (ref 11.1–15.9)
Immature Grans (Abs): 0 10*3/uL (ref 0.0–0.1)
Immature Granulocytes: 0 %
Lymphocytes Absolute: 3 10*3/uL (ref 0.7–3.1)
Lymphs: 42 %
MCH: 30.8 pg (ref 26.6–33.0)
MCHC: 33.6 g/dL (ref 31.5–35.7)
MCV: 92 fL (ref 79–97)
Monocytes Absolute: 0.8 10*3/uL (ref 0.1–0.9)
Monocytes: 11 %
Neutrophils Absolute: 3.1 10*3/uL (ref 1.4–7.0)
Neutrophils: 43 %
Platelets: 234 10*3/uL (ref 150–450)
RBC: 4.39 x10E6/uL (ref 3.77–5.28)
RDW: 12.3 % (ref 11.7–15.4)
WBC: 7.2 10*3/uL (ref 3.4–10.8)

## 2021-11-23 LAB — LIPID PANEL
Chol/HDL Ratio: 4.9 ratio — ABNORMAL HIGH (ref 0.0–4.4)
Cholesterol, Total: 204 mg/dL — ABNORMAL HIGH (ref 100–199)
HDL: 42 mg/dL (ref >39)
LDL Chol Calc (NIH): 108 mg/dL — ABNORMAL HIGH (ref 0–99)
Triglycerides: 317 mg/dL — ABNORMAL HIGH (ref 0–149)
VLDL Cholesterol Cal: 54 mg/dL — ABNORMAL HIGH (ref 5–40)

## 2021-12-10 DIAGNOSIS — R3915 Urgency of urination: Secondary | ICD-10-CM | POA: Diagnosis not present

## 2021-12-10 DIAGNOSIS — R300 Dysuria: Secondary | ICD-10-CM | POA: Diagnosis not present

## 2022-01-07 ENCOUNTER — Ambulatory Visit: Payer: Self-pay | Admitting: *Deleted

## 2022-01-07 NOTE — Chronic Care Management (AMB) (Signed)
  Chronic Care Management   Note  01/07/2022 Name: Caitlin Mccarthy MRN: [id number] DOB: 1936/03/30   Patient has not recently engaged with the Chronic Care Management RN Care Manager. Removing RN Care Manager from Care Team and closing [place]. If patient is currently engaged with another CCM team member I will forward this encounter to inform them of my case closure. Patient may be eligible for re-engagement with RN Care Manager in the future if necessary and can discuss this with their PCP.  Chong Sicilian, BSN, RN-BC Embedded Chronic Care Manager [facility] Direct Dial: [phone]

## 2022-01-09 DIAGNOSIS — N3020 Other chronic cystitis without hematuria: Secondary | ICD-10-CM | POA: Diagnosis not present

## 2022-01-09 DIAGNOSIS — R300 Dysuria: Secondary | ICD-10-CM | POA: Diagnosis not present

## 2022-01-14 ENCOUNTER — Other Ambulatory Visit: Payer: Self-pay | Admitting: Nurse Practitioner

## 2022-01-14 DIAGNOSIS — M1612 Unilateral primary osteoarthritis, left hip: Secondary | ICD-10-CM

## 2022-01-28 DIAGNOSIS — R69 Illness, unspecified: Secondary | ICD-10-CM | POA: Diagnosis not present

## 2022-02-05 ENCOUNTER — Ambulatory Visit (INDEPENDENT_AMBULATORY_CARE_PROVIDER_SITE_OTHER): Payer: Medicare HMO

## 2022-02-05 VITALS — Wt 203.0 lb

## 2022-02-05 DIAGNOSIS — Z0000 Encounter for general adult medical examination without abnormal findings: Secondary | ICD-10-CM | POA: Diagnosis not present

## 2022-02-05 NOTE — Patient Instructions (Signed)
Caitlin Mccarthy , Thank you for taking time to come for your Medicare Wellness Visit. I appreciate your ongoing commitment to your health goals. Please review the following plan we discussed and let me know if I can assist you in the future.   Screening recommendations/referrals: Colonoscopy: no longer required Mammogram: no longer required - still covered annually Bone Density: Done 04/10/2016 - recommended every 2 years - we can do this at our office if you'd like Recommended yearly ophthalmology/optometry visit for glaucoma screening and checkup Recommended yearly dental visit for hygiene and checkup  Vaccinations: Influenza vaccine: Done 05/21/2021 - Repeat annually  Pneumococcal vaccine: Done 10/12/2010 & 11/24/2014   Tdap vaccine: Done 10/12/2010 -  Repeat in 10 years Shingles vaccine: Done 02/03/2020 & 08/13/2021   Covid-19: Done 08/02/2019 & 08/29/2019 - for boosters, contact pharmacy  Advanced directives: Advance directive discussed with you today. Even though you declined this today, please call our office should you change your mind, and we can give you the proper paperwork for you to fill out.   Conditions/risks identified: Aim for 30 minutes of exercise or brisk walking, 6-8 glasses of water, and 5 servings of fruits and vegetables each day.   Next appointment: Follow up in one year for your annual wellness visit    Preventive Care 65 Years and Older, Female Preventive care refers to lifestyle choices and visits with your health care provider that can promote health and wellness. What does preventive care include? A yearly physical exam. This is also called an annual well check. Dental exams once or twice a year. Routine eye exams. Ask your health care provider how often you should have your eyes checked. Personal lifestyle choices, including: Daily care of your teeth and gums. Regular physical activity. Eating a healthy diet. Avoiding tobacco and drug use. Limiting alcohol  use. Practicing safe sex. Taking low-dose aspirin every day. Taking vitamin and mineral supplements as recommended by your health care provider. What happens during an annual well check? The services and screenings done by your health care provider during your annual well check will depend on your age, overall health, lifestyle risk factors, and family history of disease. Counseling  Your health care provider may ask you questions about your: Alcohol use. Tobacco use. Drug use. Emotional well-being. Home and relationship well-being. Sexual activity. Eating habits. History of falls. Memory and ability to understand (cognition). Work and work Statistician. Reproductive health. Screening  You may have the following tests or measurements: Height, weight, and BMI. Blood pressure. Lipid and cholesterol levels. These may be checked every 5 years, or more frequently if you are over 33 years old. Skin check. Lung cancer screening. You may have this screening every year starting at age 42 if you have a 30-pack-year history of smoking and currently smoke or have quit within the past 15 years. Fecal occult blood test (FOBT) of the stool. You may have this test every year starting at age 61. Flexible sigmoidoscopy or colonoscopy. You may have a sigmoidoscopy every 5 years or a colonoscopy every 10 years starting at age 52. Hepatitis C blood test. Hepatitis B blood test. Sexually transmitted disease (STD) testing. Diabetes screening. This is done by checking your blood sugar (glucose) after you have not eaten for a while (fasting). You may have this done every 1-3 years. Bone density scan. This is done to screen for osteoporosis. You may have this done starting at age 23. Mammogram. This may be done every 1-2 years. Talk to your health  care provider about how often you should have regular mammograms. Talk with your health care provider about your test results, treatment options, and if necessary,  the need for more tests. Vaccines  Your health care provider may recommend certain vaccines, such as: Influenza vaccine. This is recommended every year. Tetanus, diphtheria, and acellular pertussis (Tdap, Td) vaccine. You may need a Td booster every 10 years. Zoster vaccine. You may need this after age 25. Pneumococcal 13-valent conjugate (PCV13) vaccine. One dose is recommended after age 26. Pneumococcal polysaccharide (PPSV23) vaccine. One dose is recommended after age 35. Talk to your health care provider about which screenings and vaccines you need and how often you need them. This information is not intended to replace advice given to you by your health care provider. Make sure you discuss any questions you have with your health care provider. Document Released: 07/14/2015 Document Revised: 03/06/2016 Document Reviewed: 04/18/2015 Elsevier Interactive Patient Education  2017 [employer] Prevention in the Home Falls can cause injuries. They can happen to people of all ages. There are many things you can do to make your home safe and to help prevent falls. What can I do on the outside of my home? Regularly fix the edges of walkways and driveways and fix any cracks. Remove anything that might make you trip as you walk through a door, such as a raised step or threshold. Trim any bushes or trees on the path to your home. Use bright outdoor lighting. Clear any walking paths of anything that might make someone trip, such as rocks or tools. Regularly check to see if handrails are loose or broken. Make sure that both sides of any steps have handrails. Any raised decks and porches should have guardrails on the edges. Have any leaves, snow, or ice cleared regularly. Use sand or salt on walking paths during winter. Clean up any spills in your garage right away. This includes oil or grease spills. What can I do in the bathroom? Use night lights. Install grab bars by the toilet and in the  tub and shower. Do not use towel bars as grab bars. Use non-skid mats or decals in the tub or shower. If you need to sit down in the shower, use a plastic, non-slip stool. Keep the floor dry. Clean up any water that spills on the floor as soon as it happens. Remove soap buildup in the tub or shower regularly. Attach bath mats securely with double-sided non-slip rug tape. Do not have throw rugs and other things on the floor that can make you trip. What can I do in the bedroom? Use night lights. Make sure that you have a light by your bed that is easy to reach. Do not use any sheets or blankets that are too big for your bed. They should not hang down onto the floor. Have a firm chair that has side arms. You can use this for support while you get dressed. Do not have throw rugs and other things on the floor that can make you trip. What can I do in the kitchen? Clean up any spills right away. Avoid walking on wet floors. Keep items that you use a lot in easy-to-reach places. If you need to reach something above you, use a strong step stool that has a grab bar. Keep electrical cords out of the way. Do not use floor polish or wax that makes floors slippery. If you must use wax, use non-skid floor wax. Do not have throw  rugs and other things on the floor that can make you trip. What can I do with my stairs? Do not leave any items on the stairs. Make sure that there are handrails on both sides of the stairs and use them. Fix handrails that are broken or loose. Make sure that handrails are as long as the stairways. Check any carpeting to make sure that it is firmly attached to the stairs. Fix any carpet that is loose or worn. Avoid having throw rugs at the top or bottom of the stairs. If you do have throw rugs, attach them to the floor with carpet tape. Make sure that you have a light switch at the top of the stairs and the bottom of the stairs. If you do not have them, ask someone to add them for  you. What else can I do to help prevent falls? Wear shoes that: Do not have high heels. Have rubber bottoms. Are comfortable and fit you well. Are closed at the toe. Do not wear sandals. If you use a stepladder: Make sure that it is fully opened. Do not climb a closed stepladder. Make sure that both sides of the stepladder are locked into place. Ask someone to hold it for you, if possible. Clearly mark and make sure that you can see: Any grab bars or handrails. First and last steps. Where the edge of each step is. Use tools that help you move around (mobility aids) if they are needed. These include: Canes. Walkers. Scooters. Crutches. Turn on the lights when you go into a dark area. Replace any light bulbs as soon as they burn out. Set up your furniture so you have a clear path. Avoid moving your furniture around. If any of your floors are uneven, fix them. If there are any pets around you, be aware of where they are. Review your medicines with your doctor. Some medicines can make you feel dizzy. This can increase your chance of falling. Ask your doctor what other things that you can do to help prevent falls. This information is not intended to replace advice given to you by your health care provider. Make sure you discuss any questions you have with your health care provider. Document Released: 04/13/2009 Document Revised: 11/23/2015 Document Reviewed: 07/22/2014 Elsevier Interactive Patient Education  2017 [employer].

## 2022-02-05 NOTE — Progress Notes (Signed)
Subjective:   Caitlin Mccarthy is a 86 y.o. female who presents for Medicare Annual (Subsequent) preventive examination.  Virtual Visit via Telephone Note  I connected with  Caitlin Mccarthy on 02/05/22 at  1:15 PM EDT by telephone and verified that I am speaking with the correct person using two identifiers.  Location: Patient: Home Provider: [facility] Persons participating in the virtual visit: patient/Nurse Health Advisor   I discussed the limitations, risks, security and privacy concerns of performing an evaluation and management service by telephone and the availability of in person appointments. The patient expressed understanding and agreed to proceed.  Interactive audio and video telecommunications were attempted between this nurse and patient, however failed, due to patient having technical difficulties OR patient did not have access to video capability.  We continued and completed visit with audio only.  Some vital signs may be absent or patient reported.   Caitlin Mccarthy Caitlin Brady Plant, LPN   Review of Systems     Cardiac Risk Factors include: advanced age (>94mn, >>82women);diabetes mellitus;dyslipidemia;hypertension;sedentary lifestyle;smoking/ tobacco exposure;Other (see comment), Risk factor comments: COPD, hx of stroke     Objective:    Today's Vitals   02/05/22 1316  Weight: 203 lb (92.1 kg)  PainSc: 4    Body mass index is 29.13 kg/m.     02/05/2022    1:23 PM 02/02/2021    1:32 PM 01/31/2020    3:10 PM 01/11/2019    2:30 PM 04/01/2017    7:06 AM 01/09/2017    5:30 PM 01/09/2017    9:29 AM  Advanced Directives  Does Patient Have a Medical Advance Directive? Yes Yes Yes Yes No No No  Type of Caitlin Mccarthy Living Mccarthy;Healthcare Power of Attorney Living Mccarthy     Does patient want to make changes to medical advance directive?   No - Patient declined No - Patient declined     Copy of [place] Chart? No - copy requested No - copy requested No - copy requested      Would patient like information on creating a medical advance directive?     No - Patient declined No - Patient declined     Current Medications (verified) Outpatient Encounter Medications as of 02/05/2022  Medication Sig   Alcohol Swabs (B-D SINGLE USE SWABS REGULAR) PADS Test BS daily and as needed Dx E11.65   amLODipine-benazepril (LOTREL) 10-40 MG capsule Take 1 capsule by mouth daily.   aspirin EC 81 MG tablet Take 1 tablet (81 mg total) by mouth daily.   Baclofen 5 MG TABS Take 5 mg by mouth 3 (three) times daily as needed.   Blood Glucose Calibration (TRUE METRIX LEVEL 1) Low SOLN Use with glucose monitor Dx E11.65   Blood Glucose Monitoring Suppl (TRUE METRIX AIR GLUCOSE METER) w/Device KIT Test BS daily and as needed Dx E11.65   chlorthalidone (HYGROTON) 25 MG tablet Take 0.5 tablets (12.5 mg total) by mouth daily.   cholecalciferol (VITAMIN D3) 25 MCG (1000 UNIT) tablet Take 1,000 Units by mouth daily.   fenofibrate (TRICOR) 145 MG tablet Take 1 tablet (145 mg total) by mouth daily.   fluticasone (FLONASE) 50 MCG/ACT nasal spray Place 2 sprays into both nostrils daily.   gabapentin (NEURONTIN) 100 MG capsule TAKE ONE (1) CAPSULE THREE (3) TIMES EACH DAY   glucose blood (TRUE METRIX BLOOD GLUCOSE TEST) test strip Test BS daily and as needed  Dx E11.65   hydrOXYzine (VISTARIL) 25 MG capsule Take 1 capsule (25 mg total) by mouth 2 (two) times daily as needed.   metFORMIN (GLUCOPHAGE-XR) 500 MG 24 hr tablet Take 1 tablet (500 mg total) by mouth in the morning and at bedtime.   omeprazole (PRILOSEC) 40 MG capsule Take 1 capsule (40 mg total) by mouth daily.   ondansetron (ZOFRAN-ODT) 4 MG disintegrating tablet TAKE 1 TABLET EVERY 8 HOURS AS NEEDED FOR NAUSEA AND VOMITING   polyethylene glycol (MIRALAX / GLYCOLAX) 17 g packet Take 17 g by mouth daily.   PREMARIN vaginal cream Apply 1 application  topically daily as needed (irritation).    rosuvastatin (CRESTOR) 40 MG tablet TAKE ONE (1) TABLET EACH DAY   traMADol (ULTRAM) 50 MG tablet Take 2 tablets (100 mg total) by mouth 2 (two) times daily. TAKE ONE TABLET FOUR TIMES DAILY   TRUEplus Lancets 33G MISC Test BS daily and as needed Dx E11.65   guaiFENesin (MUCINEX) 600 MG 12 hr tablet Take 1 tablet (600 mg total) by mouth 2 (two) times daily. (Patient not taking: Reported on 02/05/2022)   methocarbamol (ROBAXIN) 500 MG tablet  (Patient not taking: Reported on 02/05/2022)   nystatin powder Apply topically 2 (two) times daily. (Patient not taking: Reported on 02/05/2022)   No facility-administered encounter medications on file as of 02/05/2022.    Allergies (verified) Iodinated contrast media and Latex   History: Past Medical History:  Diagnosis Date   Anxiety    Depression    GERD (gastroesophageal reflux disease)    History of acute pyelonephritis    last episode 04-13-2015  w/ sepsis   History of kidney stones    History of recurrent UTIs    MULTIPLE   Hyperlipidemia    Hypertension    OA (osteoarthritis)    knees, hips, hands   Osteoporosis    Poor memory    especially when has uti   Renal calculus, left    Stroke ([place])    Type 2 diabetes mellitus ([place])    Vitamin D deficiency 05/10/2016   Past Surgical History:  Procedure Laterality Date   CATARACT EXTRACTION W/PHACO  06/06/2011   Procedure: CATARACT EXTRACTION PHACO AND INTRAOCULAR LENS PLACEMENT ([place]);  Surgeon: Tonny Branch;  Location: [facility];  Service: Ophthalmology;  Laterality: Right;  CDE=12.77   CATARACT EXTRACTION W/PHACO  06/27/2011   Procedure: CATARACT EXTRACTION PHACO AND INTRAOCULAR LENS PLACEMENT ([facility]);  Surgeon: Tonny Branch;  Location: [facility];  Service: Ophthalmology;  Laterality: Left;  CDE:13.96   CYSTOSCOPY WITH URETEROSCOPY AND STENT PLACEMENT Right 04/27/2015   Procedure: CYSTOSCOPY WITH RIGHT URETEROSCOPY, BASKET REMOVAL OF STONE, REMOVAL OF RIGHT  NEPHROSTOMY TUBE;  Surgeon: Irine Seal, MD;  Location: [facility];  Service: Urology;  Laterality: Right;   CYSTOSCOPY/URETEROSCOPY/HOLMIUM LASER/STENT PLACEMENT Left 12/24/2016   Procedure: LEFT URETEROSCOPY WITH HOLMIUM LASER AND STENT PLACEMENT;  Surgeon: Irine Seal, MD;  Location: [facility];  Service: Urology;  Laterality: Left;   EXTRACORPOREAL SHOCK WAVE LITHOTRIPSY  left 04-01-2016;  1980's   FRACTURE SURGERY     HIP ARTHROPLASTY     HOLMIUM LASER APPLICATION Right 16/60/6301   Procedure: HOLMIUM LASER APPLICATION;  Surgeon: Irine Seal, MD;  Location: [facility];  Service: Urology;  Laterality: Right;   KNEE ARTHROPLASTY     KNEE ARTHROSCOPY Bilateral right 2005//  left ?   LOOP RECORDER INSERTION N/A 04/01/2017   Procedure: LOOP RECORDER INSERTION;  Surgeon: Thompson Grayer, MD;  Location: [place] [facility];  Service: Cardiovascular;  Laterality: N/A;   TOTAL HIP ARTHROPLASTY  10/28/2011   Procedure: TOTAL HIP ARTHROPLASTY;  Surgeon: Gearlean Alf, MD;  Location: [facility];  Service: Orthopedics;  Laterality: Right;   TOTAL KNEE ARTHROPLASTY Bilateral left 03-09-2007//  right 2006   TRANSTHORACIC ECHOCARDIOGRAM  10/06/2006   normal echo,  ef 55-60%   TUBAL LIGATION     Family History  Problem Relation Age of Onset   Kidney disease Mother    Diabetes Mother    Congestive Heart Failure Father    Heart failure Father    Heart disease Brother    Alcohol abuse Brother    Anesthesia problems Neg Hx    Hypotension Neg Hx    Malignant hyperthermia Neg Hx    Pseudochol deficiency Neg Hx    Social History   Socioeconomic History   Marital status: Married    Spouse name: Ted   Number of children: 2   Years of education: 11   Highest education level: Not on file  Occupational History   Occupation: Retired    Comment: retired  Tobacco Use   Smoking status: Every Day    Packs/day: 0.25    Years: 42.00    Total pack years: 10.50     Types: Cigarettes   Smokeless tobacco: Never   Tobacco comments:    1 pack last 4 days   Vaping Use   Vaping Use: Never used  Substance and Sexual Activity   Alcohol use: No   Drug use: No   Sexual activity: Not Currently    Birth control/protection: Post-menopausal  Other Topics Concern   Not on file  Social History Narrative   Lives with husband   Her daughters take turns coming daily.   Social Determinants of Health   Financial Resource Strain: Low Risk  (02/05/2022)   Overall Financial Resource Strain (CARDIA)    Difficulty of Paying Living Expenses: Not hard at all  Food Insecurity: No Food Insecurity (02/05/2022)   Hunger Vital Sign    Worried About Running Out of Food in the Last Year: Never true    Ran Out of Food in the Last Year: Never true  Transportation Needs: No Transportation Needs (02/05/2022)   PRAPARE - Hydrologist (Medical): No    Lack of Transportation (Non-Medical): No  Physical Activity: Insufficiently Active (02/05/2022)   Exercise Vital Sign    Days of Exercise per Week: 7 days    Minutes of Exercise per Session: 10 min  Stress: No Stress Concern Present (02/05/2022)   [place]    Feeling of Stress : Not at all  Social Connections: Moderately Integrated (02/05/2022)   Social Connection and Isolation Panel [NHANES]    Frequency of Communication with Friends and Family: More than three times a week    Frequency of Social Gatherings with Friends and Family: More than three times a week    Attends Religious Services: 1 to 4 times per year    Active Member of [employer] or Organizations: No    Attends Music therapist: Never    Marital Status: Married    Tobacco Counseling Ready to quit: Not Answered Counseling given: Not Answered Tobacco comments: 1 pack last 4 days    Clinical Intake:  Pre-visit preparation completed: Yes  Pain : 0-10 Pain Score:  4  Pain Type: Acute pain Pain Location: Back Pain Orientation:  Lower Pain Descriptors / Indicators: Aching, Sore Pain Onset: Yesterday Pain Frequency: Intermittent     BMI - recorded: 29.13 Nutritional Status: BMI 25 -29 Overweight Nutritional Risks: None Diabetes: Yes CBG done?: No Did pt. bring in CBG monitor from home?: No  How often do you need to have someone help you when you read instructions, pamphlets, or other written materials from your doctor or pharmacy?: 1 - Never  Diabetic? Nutrition Risk Assessment:  Has the patient had any N/V/D within the last 2 months?  No  Does the patient have any non-healing wounds?  No  Has the patient had any unintentional weight loss or weight gain?  No   Diabetes:  Is the patient diabetic?  Yes  If diabetic, was a CBG obtained today?  No  Did the patient bring in their glucometer from home?  No  How often do you monitor your CBG's? Usually once per day - 109 this am per pt.   Financial Strains and Diabetes Management:  Are you having any financial strains with the device, your supplies or your medication? No .  Does the patient want to be seen by Chronic Care Management for management of their diabetes?  No  Would the patient like to be referred to a Nutritionist or for Diabetic Management?  No   Diabetic Exams:  Diabetic Eye Exam: Completed 2022 per pt.   Diabetic Foot Exam: Completed 02/08/2021. Pt has been advised about the importance in completing this exam. Pt is scheduled for diabetic foot exam on 02/22/2022.    Interpreter Needed?: No  Information entered by :: Laquonda Welby, LPN   Activities of Daily Living    02/05/2022    1:21 PM  In your present state of health, do you have any difficulty performing the following activities:  Hearing? 1  Vision? 0  Difficulty concentrating or making decisions? 0  Walking or climbing stairs? 1  Dressing or bathing? 1  Comment her daughter helps her, but she feels like she can do  it alone  Doing errands, shopping? 1  Comment doesn't feel comfortable driving alone  Preparing Food and eating ? N  Using the Toilet? N  In the past six months, have you accidently leaked urine? Y  Comment seeing Urology  Do you have problems with loss of bowel control? N  Managing your Medications? N  Managing your Finances? N  Housekeeping or managing your Housekeeping? Y    Patient Care Team: [name], FNP as PCP - General (Family Medicine) Herminio Commons, MD (Inactive) as PCP - Cardiology (Cardiology) Harlen Labs, MD as Referring Physician (Optometry)  Indicate any recent Medical Services you may have received from other than [facility] providers in the past year (date may be approximate).     Assessment:   This is a routine wellness examination for Caitlin Mccarthy.  Hearing/Vision screen Hearing Screening - Comments:: Wears hearing aids - hearing worse in Left ear - doesn't like wearing them - from [facility] in [place] - Comments:: Wears rx glasses - up to date with routine eye exams with [facility]  Dietary issues and exercise activities discussed: Current Exercise Habits: The patient does not participate in regular exercise at present, Exercise limited by: orthopedic condition(s);respiratory conditions(s);neurologic condition(s)   Goals Addressed             This Visit's Progress    Exercise 150 min/wk Moderate Activity   Not on track    Walk with walker  as much as possible Wants to get out more       Depression Screen    02/05/2022    1:52 PM 11/22/2021   12:11 PM 08/13/2021    2:13 PM 02/08/2021    2:33 PM 02/02/2021    1:56 PM 11/09/2020    1:59 PM 08/10/2020   11:59 AM  PHQ 2/9 Scores  PHQ - 2 Score 0 '4 4 4 1 1 ' 0  PHQ- 9 Score  '13 16 13  8 11    ' Fall Risk    02/05/2022    1:19 PM 08/13/2021    2:13 PM 02/08/2021    2:32 PM 02/02/2021    2:01 PM 11/09/2020    1:59 PM  Fall Risk   Falls in the past year? 0 0 1 0  0  Number falls in past yr: 0  1 0   Injury with Fall? 0  0 0   Risk for fall due to : Orthopedic patient;Impaired balance/gait  History of fall(s) Impaired balance/gait;Impaired vision;Medication side effect;Orthopedic patient   Follow up Falls prevention discussed;Education provided  Education provided Education provided;Falls prevention discussed     FALL RISK PREVENTION PERTAINING TO THE HOME:  Any stairs in or around the home? Yes  If so, are there any without handrails? No  Home free of loose throw rugs in walkways, pet beds, electrical cords, etc? Yes  Adequate lighting in your home to reduce risk of falls? Yes   ASSISTIVE DEVICES UTILIZED TO PREVENT FALLS:  Life alert? No  Use of a cane, walker or w/c? Yes  Grab bars in the bathroom? Yes  Shower chair or bench in shower? Yes  Elevated toilet seat or a handicapped toilet? Yes   TIMED UP AND GO:  Was the test performed? No . Telephonic visit  Cognitive Function:        02/05/2022    1:23 PM 02/02/2021    1:38 PM 01/31/2020    2:47 PM 01/11/2019    2:31 PM  6CIT Screen  What Year? 0 points 0 points 0 points 0 points  What month? 0 points 0 points 0 points 0 points  What time? 0 points 0 points 0 points 0 points  Count back from 20 0 points 0 points 0 points 0 points  Months in reverse 4 points 2 points 2 points 0 points  Repeat phrase 2 points 2 points 2 points 0 points  Total Score 6 points 4 points 4 points 0 points    Immunizations Immunization History  Administered Date(s) Administered   Fluad Quad(high Dose 65+) 04/06/2019, 03/23/2020, 05/21/2021   Influenza, High Dose Seasonal PF 04/21/2017, 04/06/2018   Influenza,inj,Quad PF,6+ Mos 04/02/2013, 04/06/2014, 04/14/2015, 04/05/2016   Influenza,inj,quad, With Preservative 03/31/2017, 03/31/2018   Moderna Sars-Covid-2 Vaccination 08/02/2019, 08/29/2019   Pneumococcal Conjugate-13 11/24/2014   Pneumococcal Polysaccharide-23 06/08/2003, 10/12/2010    Pneumococcal-Unspecified 03/31/2017   Td 10/12/2010   Tdap 10/12/2010   Zoster Recombinat (Shingrix) 02/03/2020, 08/13/2021    TDAP status: Due, Education has been provided regarding the importance of this vaccine. Advised may receive this vaccine at local pharmacy or Health Dept. Aware to provide a copy of the vaccination record if obtained from local pharmacy or Health Dept. Verbalized acceptance and understanding.  Flu Vaccine status: Up to date  Pneumococcal vaccine status: Up to date  Covid-19 vaccine status: Completed vaccines  Qualifies for Shingles Vaccine? Yes   Zostavax completed Yes   Shingrix Completed?: Yes  Screening Tests Health Maintenance  Topic Date Due   OPHTHALMOLOGY EXAM  05/26/2019   COVID-19 Vaccine (3 - Moderna series) 10/24/2019   INFLUENZA VACCINE  01/29/2022   TETANUS/TDAP  08/13/2022 (Originally 10/11/2020)   FOOT EXAM  02/08/2022   HEMOGLOBIN A1C  05/25/2022   Pneumonia Vaccine 1+ Years old  Completed   DEXA SCAN  Completed   Zoster Vaccines- Shingrix  Completed   HPV VACCINES  Aged Out    Health Maintenance  Health Maintenance Due  Topic Date Due   OPHTHALMOLOGY EXAM  05/26/2019   COVID-19 Vaccine (3 - Moderna series) 10/24/2019   INFLUENZA VACCINE  01/29/2022    Colorectal cancer screening: No longer required.   Mammogram status: No longer required due to age and she declines.  Bone Density status: Completed 04/10/2016. Results reflect: Bone density results: OSTEOPOROSIS. Repeat every 2 years. Declines repeat  Lung Cancer Screening: (Low Dose CT Chest recommended if Age 51-80 years, 30 pack-year currently smoking OR have quit w/in 15years.) does not qualify.   Additional Screening:  Hepatitis C Screening: does not qualify  Vision Screening: Recommended annual ophthalmology exams for early detection of glaucoma and other disorders of the eye. Is the patient up to date with their annual eye exam?  Yes  Who is the provider or what is  the name of the office in which the patient attends annual eye exams? [facility] If pt is not established with a provider, would they like to be referred to a provider to establish care? No .   Dental Screening: Recommended annual dental exams for proper oral hygiene  Community Resource Referral / Chronic Care Management: CRR required this visit?  No   CCM required this visit?  No      Plan:     I have personally reviewed and noted the following in the patient's chart:   Medical and social history Use of alcohol, tobacco or illicit drugs  Current medications and supplements including opioid prescriptions.  Functional ability and status Nutritional status Physical activity Advanced directives List of other physicians Hospitalizations, surgeries, and ER visits in previous 12 months Vitals Screenings to include cognitive, depression, and falls Referrals and appointments  In addition, I have reviewed and discussed with patient certain preventive protocols, quality metrics, and best practice recommendations. A written personalized care plan for preventive services as well as general preventive health recommendations were provided to patient.     Sandrea Hammond, LPN   9/0/3833   Nurse Notes: None

## 2022-02-22 ENCOUNTER — Ambulatory Visit: Payer: Medicare HMO | Admitting: Nurse Practitioner

## 2022-02-26 ENCOUNTER — Encounter: Payer: Self-pay | Admitting: Nurse Practitioner

## 2022-02-26 ENCOUNTER — Ambulatory Visit (INDEPENDENT_AMBULATORY_CARE_PROVIDER_SITE_OTHER): Payer: Medicare HMO | Admitting: Nurse Practitioner

## 2022-02-26 VITALS — BP 142/73 | HR 72 | Temp 96.7°F | Resp 20 | Ht 70.0 in | Wt 193.0 lb

## 2022-02-26 DIAGNOSIS — E876 Hypokalemia: Secondary | ICD-10-CM | POA: Diagnosis not present

## 2022-02-26 DIAGNOSIS — I6930 Unspecified sequelae of cerebral infarction: Secondary | ICD-10-CM

## 2022-02-26 DIAGNOSIS — E1169 Type 2 diabetes mellitus with other specified complication: Secondary | ICD-10-CM | POA: Diagnosis not present

## 2022-02-26 DIAGNOSIS — E785 Hyperlipidemia, unspecified: Secondary | ICD-10-CM

## 2022-02-26 DIAGNOSIS — J410 Simple chronic bronchitis: Secondary | ICD-10-CM | POA: Diagnosis not present

## 2022-02-26 DIAGNOSIS — I10 Essential (primary) hypertension: Secondary | ICD-10-CM

## 2022-02-26 DIAGNOSIS — K219 Gastro-esophageal reflux disease without esophagitis: Secondary | ICD-10-CM | POA: Diagnosis not present

## 2022-02-26 DIAGNOSIS — N3281 Overactive bladder: Secondary | ICD-10-CM | POA: Diagnosis not present

## 2022-02-26 DIAGNOSIS — F339 Major depressive disorder, recurrent, unspecified: Secondary | ICD-10-CM | POA: Diagnosis not present

## 2022-02-26 DIAGNOSIS — F411 Generalized anxiety disorder: Secondary | ICD-10-CM

## 2022-02-26 DIAGNOSIS — F17200 Nicotine dependence, unspecified, uncomplicated: Secondary | ICD-10-CM

## 2022-02-26 DIAGNOSIS — E559 Vitamin D deficiency, unspecified: Secondary | ICD-10-CM

## 2022-02-26 DIAGNOSIS — E663 Overweight: Secondary | ICD-10-CM

## 2022-02-26 DIAGNOSIS — M818 Other osteoporosis without current pathological fracture: Secondary | ICD-10-CM

## 2022-02-26 DIAGNOSIS — E1165 Type 2 diabetes mellitus with hyperglycemia: Secondary | ICD-10-CM | POA: Diagnosis not present

## 2022-02-26 DIAGNOSIS — M160 Bilateral primary osteoarthritis of hip: Secondary | ICD-10-CM

## 2022-02-26 LAB — BAYER DCA HB A1C WAIVED: HB A1C (BAYER DCA - WAIVED): 6.5 % — ABNORMAL HIGH (ref 4.8–5.6)

## 2022-02-26 MED ORDER — CHLORTHALIDONE 25 MG PO TABS: 12.5000 mg | ORAL_TABLET | Freq: Every day | ORAL | 1 refills | Status: DC

## 2022-02-26 MED ORDER — OMEPRAZOLE 40 MG PO CPDR: 40.0000 mg | DELAYED_RELEASE_CAPSULE | Freq: Every day | ORAL | 1 refills | Status: DC

## 2022-02-26 MED ORDER — GABAPENTIN 100 MG PO CAPS: 100.0000 mg | ORAL_CAPSULE | Freq: Three times a day (TID) | ORAL | 2 refills | Status: DC

## 2022-02-26 MED ORDER — ROSUVASTATIN CALCIUM 40 MG PO TABS: ORAL_TABLET | ORAL | 1 refills | Status: DC

## 2022-02-26 MED ORDER — ONDANSETRON HCL 4 MG PO TABS: 4.0000 mg | ORAL_TABLET | Freq: Three times a day (TID) | ORAL | 0 refills | Status: DC | PRN

## 2022-02-26 MED ORDER — METFORMIN HCL ER 500 MG PO TB24: 500.0000 mg | ORAL_TABLET | Freq: Two times a day (BID) | ORAL | 1 refills | Status: DC

## 2022-02-26 MED ORDER — LORAZEPAM 0.5 MG PO TABS: 0.5000 mg | ORAL_TABLET | Freq: Two times a day (BID) | ORAL | 1 refills | Status: DC | PRN

## 2022-02-26 MED ORDER — AMLODIPINE BESY-BENAZEPRIL HCL 10-40 MG PO CAPS: 1.0000 | ORAL_CAPSULE | Freq: Every day | ORAL | 1 refills | Status: DC

## 2022-02-26 MED ORDER — FENOFIBRATE 145 MG PO TABS: 145.0000 mg | ORAL_TABLET | Freq: Every day | ORAL | 1 refills | Status: DC

## 2022-02-26 MED ORDER — TRAMADOL HCL 50 MG PO TABS: 100.0000 mg | ORAL_TABLET | Freq: Two times a day (BID) | ORAL | 5 refills | Status: DC

## 2022-02-26 NOTE — Patient Instructions (Signed)

## 2022-02-26 NOTE — Progress Notes (Signed)
Subjective:    Patient ID: Caitlin Mccarthy, female    DOB: Jan 12, 1936, 86 y.o.   MRN: [id number]   Chief Complaint: medical management of chronic issues     HPI:  Caitlin Mccarthy is a 86 y.o. who identifies as a female who was assigned female at birth.   Social history: Lives with: husband- her daughter comes over everyday to check on them Work history: retired   Scientist, forensic in today for follow up of the following chronic medical issues:  1. Essential hypertension, benign No c/o chest pain, sob or headache. Does not check blood pressure at home. BP Readings from Last 3 Encounters:  11/22/21 (!) 142/78  08/13/21 (!) 166/77  02/08/21 113/71     2. Simple chronic bronchitis (HCC) Has occasional cough. Doe snot use any inhalers  3. Gastroesophageal reflux disease without esophagitis Is on omeprazole daily and is doing well.  4. Type 2 diabetes mellitus with hyperglycemia, without long-term current use of insulin (HCC) Fasting blood sugars running around 110-140. No low blood sugras Lab Results  Component Value Date   HGBA1C 6.2 (H) 11/22/2021     5. Hyperlipidemia due to type 2 diabetes mellitus ([place]) Is on crestor daily. Does not really watch diet and does no dedicated exercise. Lab Results  Component Value Date   CHOL 204 (H) 11/22/2021   HDL 42 11/22/2021   LDLCALC 108 (H) 11/22/2021   TRIG 317 (H) 11/22/2021   CHOLHDL 4.9 (H) 11/22/2021     6. Hypokalemia No c/o muscle cramps. Lab Results  Component Value Date   K 5.1 11/22/2021     7. Overactive bladder Is on no prescripton meds. Doe shave to go to the restroom frequently  8. Late effect of cerebrovascular accident (CVA) No residual effects  9. Episode of recurrent major depressive disorder, unspecified depression episode severity ([place]) Is currently on no antidepressant. Is doing well.    02/26/2022   12:13 PM 02/05/2022    1:52 PM 11/22/2021   12:11 PM  Depression screen PHQ 2/9  Decreased  Interest 2 0 2  Down, Depressed, Hopeless 2 0 2  PHQ - 2 Score 4 0 4  Altered sleeping 3  2  Tired, decreased energy 2  3  Change in appetite 2  0  Feeling bad or failure about yourself  2  1  Trouble concentrating 2  2  Moving slowly or fidgety/restless 0  1  Suicidal thoughts 0  0  PHQ-9 Score 15  13  Difficult doing work/chores Not difficult at all  Not difficult at all     10. GAD (generalized anxiety disorder) Is on vistaril as needed for anxiety.    02/26/2022   12:13 PM 11/22/2021   12:12 PM 08/13/2021    2:13 PM 02/08/2021    2:33 PM  GAD 7 : Generalized Anxiety Score  Nervous, Anxious, on Edge _0 Control/stop worrying _1 Worry too much - different things _2 Trouble relaxing _3 Restless _4 Easily annoyed or irritable _5 Afraid - awful might happen _6 Total GAD 7 Score _7 Anxiety Difficulty Not difficult at all Not difficult at all Somewhat difficult Somewhat difficult      11. Vitamin D deficiency Is on daily vitamin d supplement  12. Other osteoporosis without current pathological fracture Last dexascan  was in 2017. No longer wants to do  13. Primary osteoarthritis of both hips Uses a walker when walking Pain assessment: Cause of pain- osteoarthritis Pain location- back and hips Pain on scale of 1-10- 8-9/10 Frequency- daily What increases pain-walking and standing What makes pain Better-rest helps Effects on ADL - none Any change in general medical condition-none  Current opioids rx- ultram # meds rx- 60 Effectiveness of current meds-helps Adverse reactions from pain meds-none Morphine equivalent- 20 MME  Pill count performed-No Last drug screen - 08/13/21 ( high risk q60m moderate risk [id number] risk yearly ) Urine drug screen today- No Was [place]- yes  If yes were their any concerning findings? - no   Overdose risk: 1   Pain contract signed on:08/14/21  14. Current  smoker Smokes at least 1/2 pack a day  15. Overweight (BMI 25.0-29.9) Weight is down 10 lbs Wt Readings from Last 3 Encounters:  02/26/22 193 lb (87.5 kg)  02/05/22 203 lb (92.1 kg)  11/22/21 203 lb (92.1 kg)   BMI Readings from Last 3 Encounters:  02/26/22 27.69 kg/m  02/05/22 29.13 kg/m  11/22/21 29.13 kg/m      New complaints: Not sleeping well. Says she has trouble falling aslepp and then she wake sup several time sto go  to the restroom.  Allergies  Allergen Reactions   Iodinated Contrast Media    Latex    Outpatient Encounter Medications as of 02/26/2022  Medication Sig   Alcohol Swabs (B-D SINGLE USE SWABS REGULAR) PADS Test BS daily and as needed Dx E11.65   amLODipine-benazepril (LOTREL) 10-40 MG capsule Take 1 capsule by mouth daily.   aspirin EC 81 MG tablet Take 1 tablet (81 mg total) by mouth daily.   Baclofen 5 MG TABS Take 5 mg by mouth 3 (three) times daily as needed.   Blood Glucose Calibration (TRUE METRIX LEVEL 1) Low SOLN Use with glucose monitor Dx E11.65   Blood Glucose Monitoring Suppl (TRUE METRIX AIR GLUCOSE METER) w/Device KIT Test BS daily and as needed Dx E11.65   chlorthalidone (HYGROTON) 25 MG tablet Take 0.5 tablets (12.5 mg total) by mouth daily.   cholecalciferol (VITAMIN D3) 25 MCG (1000 UNIT) tablet Take 1,000 Units by mouth daily.   fenofibrate (TRICOR) 145 MG tablet Take 1 tablet (145 mg total) by mouth daily.   fluticasone (FLONASE) 50 MCG/ACT nasal spray Place 2 sprays into both nostrils daily.   gabapentin (NEURONTIN) 100 MG capsule TAKE ONE (1) CAPSULE THREE (3) TIMES EACH DAY   glucose blood (TRUE METRIX BLOOD GLUCOSE TEST) test strip Test BS daily and as needed Dx E11.65   guaiFENesin (MUCINEX) 600 MG 12 hr tablet Take 1 tablet (600 mg total) by mouth 2 (two) times daily. (Patient not taking: Reported on 02/05/2022)   hydrOXYzine (VISTARIL) 25 MG capsule Take 1 capsule (25 mg total) by mouth 2 (two) times daily as needed.    metFORMIN (GLUCOPHAGE-XR) 500 MG 24 hr tablet Take 1 tablet (500 mg total) by mouth in the morning and at bedtime.   methocarbamol (ROBAXIN) 500 MG tablet  (Patient not taking: Reported on 02/05/2022)   nystatin powder Apply topically 2 (two) times daily. (Patient not taking: Reported on 02/05/2022)   omeprazole (PRILOSEC) 40 MG capsule Take 1 capsule (40 mg total) by mouth daily.   ondansetron (ZOFRAN-ODT) 4 MG disintegrating tablet TAKE 1 TABLET EVERY 8 HOURS AS NEEDED FOR NAUSEA AND VOMITING   polyethylene glycol (MIRALAX / GLYCOLAX) 17  g packet Take 17 g by mouth daily.   PREMARIN vaginal cream Apply 1 application topically daily as needed (irritation).    rosuvastatin (CRESTOR) 40 MG tablet TAKE ONE (1) TABLET EACH DAY   traMADol (ULTRAM) 50 MG tablet Take 2 tablets (100 mg total) by mouth 2 (two) times daily. TAKE ONE TABLET FOUR TIMES DAILY   TRUEplus Lancets 33G MISC Test BS daily and as needed Dx E11.65   No facility-administered encounter medications on file as of 02/26/2022.    Past Surgical History:  Procedure Laterality Date   CATARACT EXTRACTION W/PHACO  06/06/2011   Procedure: CATARACT EXTRACTION PHACO AND INTRAOCULAR LENS PLACEMENT (IOC);  Surgeon: Tonny Branch;  Location: [facility];  Service: Ophthalmology;  Laterality: Right;  CDE=12.77   CATARACT EXTRACTION W/PHACO  06/27/2011   Procedure: CATARACT EXTRACTION PHACO AND INTRAOCULAR LENS PLACEMENT (IOC);  Surgeon: Tonny Branch;  Location: [facility];  Service: Ophthalmology;  Laterality: Left;  CDE:13.96   CYSTOSCOPY WITH URETEROSCOPY AND STENT PLACEMENT Right 04/27/2015   Procedure: CYSTOSCOPY WITH RIGHT URETEROSCOPY, BASKET REMOVAL OF STONE, REMOVAL OF RIGHT NEPHROSTOMY TUBE;  Surgeon: Irine Seal, MD;  Location: [facility];  Service: Urology;  Laterality: Right;   CYSTOSCOPY/URETEROSCOPY/HOLMIUM LASER/STENT PLACEMENT Left 12/24/2016   Procedure: LEFT URETEROSCOPY WITH HOLMIUM LASER AND STENT PLACEMENT;  Surgeon: Irine Seal,  MD;  Location: [facility];  Service: Urology;  Laterality: Left;   EXTRACORPOREAL SHOCK WAVE LITHOTRIPSY  left 04-01-2016;  1980's   FRACTURE SURGERY     HIP ARTHROPLASTY     HOLMIUM LASER APPLICATION Right 15/72/6203   Procedure: HOLMIUM LASER APPLICATION;  Surgeon: Irine Seal, MD;  Location: [facility];  Service: Urology;  Laterality: Right;   KNEE ARTHROPLASTY     KNEE ARTHROSCOPY Bilateral right 2005//  left ?   LOOP RECORDER INSERTION N/A 04/01/2017   Procedure: LOOP RECORDER INSERTION;  Surgeon: Thompson Grayer, MD;  Location: [facility];  Service: Cardiovascular;  Laterality: N/A;   TOTAL HIP ARTHROPLASTY  10/28/2011   Procedure: TOTAL HIP ARTHROPLASTY;  Surgeon: Gearlean Alf, MD;  Location: [facility];  Service: Orthopedics;  Laterality: Right;   TOTAL KNEE ARTHROPLASTY Bilateral left 03-09-2007//  right 2006   TRANSTHORACIC ECHOCARDIOGRAM  10/06/2006   normal echo,  ef 55-60%   TUBAL LIGATION      Family History  Problem Relation Age of Onset   Kidney disease Mother    Diabetes Mother    Congestive Heart Failure Father    Heart failure Father    Heart disease Brother    Alcohol abuse Brother    Anesthesia problems Neg Hx    Hypotension Neg Hx    Malignant hyperthermia Neg Hx    Pseudochol deficiency Neg Hx       Controlled substance contract: n/a     Review of Systems  Constitutional:  Negative for diaphoresis.  Eyes:  Negative for pain.  Respiratory:  Negative for shortness of breath.   Cardiovascular:  Negative for chest pain, palpitations and leg swelling.  Gastrointestinal:  Negative for abdominal pain.  Endocrine: Negative for polydipsia.  Skin:  Negative for rash.  Neurological:  Negative for dizziness, weakness and headaches.  Hematological:  Does not bruise/bleed easily.  All other systems reviewed and are negative.      Objective:   Physical Exam Vitals and nursing note reviewed.  Constitutional:       General: She is not in acute distress.    Appearance: Normal appearance.  She is well-developed.  HENT:     Head: Normocephalic.     Right Ear: Tympanic membrane normal.     Left Ear: Tympanic membrane normal.     Nose: Nose normal.     Mouth/Throat:     Mouth: Mucous membranes are moist.  Eyes:     Pupils: Pupils are equal, round, and reactive to light.  Neck:     Vascular: No carotid bruit or JVD.  Cardiovascular:     Rate and Rhythm: Normal rate and regular rhythm.     Heart sounds: Normal heart sounds.  Pulmonary:     Effort: Pulmonary effort is normal. No respiratory distress.     Breath sounds: Normal breath sounds. No wheezing or rales.  Chest:     Chest wall: No tenderness.  Abdominal:     General: Bowel sounds are normal. There is no distension or abdominal bruit.     Palpations: Abdomen is soft. There is no hepatomegaly, splenomegaly, mass or pulsatile mass.     Tenderness: There is no abdominal tenderness.  Musculoskeletal:        General: Normal range of motion.     Cervical back: Normal range of motion and neck supple.     Comments: walking with walker  Lymphadenopathy:     Cervical: No cervical adenopathy.  Skin:    General: Skin is warm and dry.  Neurological:     Mental Status: She is alert and oriented to person, place, and time.     Deep Tendon Reflexes: Reflexes are normal and symmetric.  Psychiatric:        Behavior: Behavior normal.        Thought Content: Thought content normal.        Judgment: Judgment normal.     BP (!) 142/73   Pulse 72   Temp (!) 96.7 F (35.9 C) (Temporal)   Resp 20   Ht _0  (1.778 m)   Wt 193 lb (87.5 kg)   SpO2 93%   BMI 27.69 kg/m   HGBA1c 6.5%     Assessment & Plan:   Caitlin Mccarthy comes in today with chief complaint of Medical Management of Chronic Issues   Diagnosis and orders addressed:  1. Essential hypertension, benign Low sodium diet - CBC with Differential/Platelet - CMP14+EGFR -  chlorthalidone (HYGROTON) 25 MG tablet; Take 0.5 tablets (12.5 mg total) by mouth daily.  Dispense: 90 tablet; Refill: 1 - amLODipine-benazepril (LOTREL) 10-40 MG capsule; Take 1 capsule by mouth daily.  Dispense: 90 capsule; Refill: 1  2. Simple chronic bronchitis ([place])  3. Gastroesophageal reflux disease without esophagitis Avoid spicy foods Do not eat 2 hours prior to bedtime - omeprazole (PRILOSEC) 40 MG capsule; Take 1 capsule (40 mg total) by mouth daily.  Dispense: 90 capsule; Refill: 1  4. Type 2 diabetes mellitus with hyperglycemia, without long-term current use of insulin (HCC) Ontinue  to wathc carbsin diet - Bayer DCA Hb A1c Waived - gabapentin (NEURONTIN) 100 MG capsule; Take 1 capsule (100 mg total) by mouth 3 (three) times daily.  Dispense: 90 capsule; Refill: 2 - metFORMIN (GLUCOPHAGE-XR) 500 MG 24 hr tablet; Take 1 tablet (500 mg total) by mouth in the morning and at bedtime.  Dispense: 180 tablet; Refill: 1  5. Hyperlipidemia due to type 2 diabetes mellitus (HCC) Low fat diet - Lipid panel - fenofibrate (TRICOR) 145 MG tablet; Take 1 tablet (145 mg total) by mouth daily.  Dispense: 90 tablet; Refill: 1 - rosuvastatin (  CRESTOR) 40 MG tablet; TAKE ONE (1) TABLET EACH DAY  Dispense: 90 tablet; Refill: 1  6. Hypokalemia Labs pending  7. Overactive bladder Keep follow up with urology  8. Late effect of cerebrovascular accident (CVA) Fall prevention  9. Episode of recurrent major depressive disorder, unspecified depression episode severity ([place]) Stress management  10. GAD (generalized anxiety disorder)  11. Vitamin D deficiency Continue daily vitamin d   12. Other osteoporosis without current pathological fracture Weight bearing exercises  13. Primary osteoarthritis of both hips - traMADol (ULTRAM) 50 MG tablet; Take 2 tablets (100 mg total) by mouth 2 (two) times daily. TAKE ONE TABLET FOUR TIMES DAILY  Dispense: 60 tablet; Refill: 5  14. Current  smoker Smoking cessation  15. Overweight (BMI 25.0-29.9)   Labs pending Health Maintenance reviewed Diet and exercise encouraged  Follow up plan: 6 months   Mary-Margaret Hassell Done, FNP

## 2022-02-27 LAB — CBC WITH DIFFERENTIAL/PLATELET
Basophils Absolute: 0 10*3/uL (ref 0.0–0.2)
Basos: 1 %
EOS (ABSOLUTE): 0.2 10*3/uL (ref 0.0–0.4)
Eos: 2 %
Hematocrit: 43.9 % (ref 34.0–46.6)
Hemoglobin: 14.4 g/dL (ref 11.1–15.9)
Immature Grans (Abs): 0 10*3/uL (ref 0.0–0.1)
Immature Granulocytes: 0 %
Lymphocytes Absolute: 2.9 10*3/uL (ref 0.7–3.1)
Lymphs: 45 %
MCH: 30.4 pg (ref 26.6–33.0)
MCHC: 32.8 g/dL (ref 31.5–35.7)
MCV: 93 fL (ref 79–97)
Monocytes Absolute: 0.6 10*3/uL (ref 0.1–0.9)
Monocytes: 9 %
Neutrophils Absolute: 2.9 10*3/uL (ref 1.4–7.0)
Neutrophils: 43 %
Platelets: 190 10*3/uL (ref 150–450)
RBC: 4.74 x10E6/uL (ref 3.77–5.28)
RDW: 12.1 % (ref 11.7–15.4)
WBC: 6.6 10*3/uL (ref 3.4–10.8)

## 2022-02-27 LAB — CMP14+EGFR
ALT: 21 IU/L (ref 0–32)
AST: 34 IU/L (ref 0–40)
Albumin/Globulin Ratio: 1.4 (ref 1.2–2.2)
Albumin: 4.2 g/dL (ref 3.7–4.7)
Alkaline Phosphatase: 58 IU/L (ref 44–121)
BUN/Creatinine Ratio: 25 (ref 12–28)
BUN: 23 mg/dL (ref 8–27)
Bilirubin Total: 0.4 mg/dL (ref 0.0–1.2)
CO2: 26 mmol/L (ref 20–29)
Calcium: 12.1 mg/dL — ABNORMAL HIGH (ref 8.7–10.3)
Chloride: 101 mmol/L (ref 96–106)
Creatinine, Ser: 0.92 mg/dL (ref 0.57–1.00)
Globulin, Total: 2.9 g/dL (ref 1.5–4.5)
Glucose: 127 mg/dL — ABNORMAL HIGH (ref 70–99)
Potassium: 4.9 mmol/L (ref 3.5–5.2)
Sodium: 140 mmol/L (ref 134–144)
Total Protein: 7.1 g/dL (ref 6.0–8.5)
eGFR: 62 mL/min/{1.73_m2} (ref >59)

## 2022-02-27 LAB — LIPID PANEL
Chol/HDL Ratio: 4 ratio (ref 0.0–4.4)
Cholesterol, Total: 180 mg/dL (ref 100–199)
HDL: 45 mg/dL (ref >39)
LDL Chol Calc (NIH): 94 mg/dL (ref 0–99)
Triglycerides: 241 mg/dL — ABNORMAL HIGH (ref 0–149)
VLDL Cholesterol Cal: 41 mg/dL — ABNORMAL HIGH (ref 5–40)

## 2022-03-11 DIAGNOSIS — E663 Overweight: Secondary | ICD-10-CM | POA: Diagnosis not present

## 2022-03-11 DIAGNOSIS — M2559 Pain in other specified joint: Secondary | ICD-10-CM | POA: Diagnosis not present

## 2022-03-11 DIAGNOSIS — Z6828 Body mass index (BMI) 28.0-28.9, adult: Secondary | ICD-10-CM | POA: Diagnosis not present

## 2022-03-11 DIAGNOSIS — M1991 Primary osteoarthritis, unspecified site: Secondary | ICD-10-CM | POA: Diagnosis not present

## 2022-03-14 DIAGNOSIS — D485 Neoplasm of uncertain behavior of skin: Secondary | ICD-10-CM | POA: Diagnosis not present

## 2022-03-14 DIAGNOSIS — C44722 Squamous cell carcinoma of skin of right lower limb, including hip: Secondary | ICD-10-CM | POA: Diagnosis not present

## 2022-03-14 DIAGNOSIS — Z85828 Personal history of other malignant neoplasm of skin: Secondary | ICD-10-CM | POA: Diagnosis not present

## 2022-03-18 ENCOUNTER — Other Ambulatory Visit: Payer: Self-pay | Admitting: Nurse Practitioner

## 2022-03-18 NOTE — Telephone Encounter (Signed)
Last OV 02/26/22. Last RF 02/26/22. Next OV no appt scheduled at this time - lov states for pt to follow up in 6 months

## 2022-04-08 ENCOUNTER — Encounter: Payer: Self-pay | Admitting: Family Medicine

## 2022-04-08 ENCOUNTER — Ambulatory Visit (INDEPENDENT_AMBULATORY_CARE_PROVIDER_SITE_OTHER): Payer: Medicare HMO | Admitting: Family Medicine

## 2022-04-08 DIAGNOSIS — N3090 Cystitis, unspecified without hematuria: Secondary | ICD-10-CM | POA: Diagnosis not present

## 2022-04-08 DIAGNOSIS — R300 Dysuria: Secondary | ICD-10-CM | POA: Diagnosis not present

## 2022-04-08 LAB — MICROSCOPIC EXAMINATION

## 2022-04-08 LAB — URINALYSIS, COMPLETE
Bilirubin, UA: NEGATIVE
Glucose, UA: NEGATIVE
Nitrite, UA: NEGATIVE
RBC, UA: NEGATIVE
Specific Gravity, UA: 1.015 (ref 1.005–1.030)
Urobilinogen, Ur: 0.2 mg/dL (ref 0.2–1.0)
pH, UA: 5.5 (ref 5.0–7.5)

## 2022-04-08 MED ORDER — SULFAMETHOXAZOLE-TRIMETHOPRIM 800-160 MG PO TABS: 1.0000 | ORAL_TABLET | Freq: Two times a day (BID) | ORAL | 0 refills | Status: DC

## 2022-04-08 NOTE — Progress Notes (Signed)
Subjective:    Patient ID: Caitlin Mccarthy, female    DOB: 1935-09-28, 86 y.o.   MRN: [id number]   HPI: Caitlin Mccarthy is a 86 y.o. female presenting for confused and malaise. Onset 3-4 days ago. No burning, but she is having frequency. Denies fever, having night sweats the last few nights. No flank pain. No nausea, vomiting. `      02/26/2022   12:13 PM 02/05/2022    1:52 PM 11/22/2021   12:11 PM 08/13/2021    2:13 PM 02/08/2021    2:33 PM  Depression screen PHQ 2/9  Decreased Interest 2 0 '2 2 2  '$ Down, Depressed, Hopeless 2 0 '2 2 2  '$ PHQ - 2 Score 4 0 '4 4 4  '$ Altered sleeping '3  2 3 2  '$ Tired, decreased energy '2  3 3 2  '$ Change in appetite 2  0 2 0  Feeling bad or failure about yourself  '2  1 1 1  '$ Trouble concentrating '2  2 2 2  '$ Moving slowly or fidgety/restless 0  1 0 2  Suicidal thoughts 0  0 1 0  PHQ-9 Score '15  13 16 13  '$ Difficult doing work/chores Not difficult at all  Not difficult at all Somewhat difficult Somewhat difficult     Relevant past medical, surgical, family and social history reviewed and updated as indicated.  Interim medical history since our last visit reviewed. Allergies and medications reviewed and updated.  ROS:  Review of Systems  Constitutional: Negative.   HENT: Negative.    Eyes:  Negative for visual disturbance.  Respiratory:  Negative for shortness of breath.   Cardiovascular:  Negative for chest pain.  Gastrointestinal:  Negative for abdominal pain.  Genitourinary:  Positive for frequency.  Musculoskeletal:  Negative for arthralgias.     Social History   Tobacco Use  Smoking Status Every Day   Packs/day: 0.25   Years: 42.00   Total pack years: 10.50   Types: Cigarettes  Smokeless Tobacco Never  Tobacco Comments   1 pack last 4 days        Objective:     Wt Readings from Last 3 Encounters:  02/26/22 193 lb (87.5 kg)  02/05/22 203 lb (92.1 kg)  11/22/21 203 lb (92.1 kg)     Exam deferred. Pt. Harboring due to COVID  19. Phone visit performed.   Assessment & Plan:   1. Cystitis     Meds ordered this encounter  Medications   sulfamethoxazole-trimethoprim (BACTRIM DS) 800-160 MG tablet    Sig: Take 1 tablet by mouth 2 (two) times daily.    Dispense:  14 tablet    Refill:  0    Orders Placed This Encounter  Procedures   Urine Culture    Standing Status:   Future    Number of Occurrences:   1    Standing Expiration Date:   04/09/2023   Microscopic Examination   Urinalysis, Complete    Standing Status:   Future    Number of Occurrences:   1    Standing Expiration Date:   04/09/2023      Diagnoses and all orders for this visit:  Cystitis -     Urine Culture; Future -     Urinalysis, Complete; Future -     Urinalysis, Complete -     Urine Culture -     sulfamethoxazole-trimethoprim (BACTRIM DS) 800-160 MG tablet; Take 1 tablet by mouth 2 (two) times daily. -  Microscopic Examination    Virtual Visit via telephone Note  I discussed the limitations, risks, security and privacy concerns of performing an evaluation and management service by telephone and the availability of in person appointments. The patient was identified with two identifiers. Pt.expressed understanding and agreed to proceed. Pt. Is at home. Dr. Livia Snellen is in his office.  Follow Up Instructions:   I discussed the assessment and treatment plan with the patient. The patient was provided an opportunity to ask questions and all were answered. The patient agreed with the plan and demonstrated an understanding of the instructions.   The patient was advised to call back or seek an in-person evaluation if the symptoms worsen or if the condition fails to improve as anticipated.   Total minutes including chart review and phone contact time: 13   Follow up plan: Return if symptoms worsen or fail to improve.  Claretta Fraise, MD [place]

## 2022-04-09 LAB — URINE CULTURE

## 2022-04-17 ENCOUNTER — Other Ambulatory Visit: Payer: Self-pay | Admitting: *Deleted

## 2022-04-17 ENCOUNTER — Telehealth: Payer: Self-pay | Admitting: Nurse Practitioner

## 2022-04-17 DIAGNOSIS — R300 Dysuria: Secondary | ICD-10-CM

## 2022-04-17 NOTE — Telephone Encounter (Signed)
Sure, please order UA & culture

## 2022-04-17 NOTE — Telephone Encounter (Signed)
ORDERS ENTERED, DAUGHTER AWARE

## 2022-04-18 ENCOUNTER — Other Ambulatory Visit: Payer: Medicare HMO

## 2022-04-18 DIAGNOSIS — R300 Dysuria: Secondary | ICD-10-CM

## 2022-04-18 LAB — MICROSCOPIC EXAMINATION: Renal Epithel, UA: NONE SEEN /hpf

## 2022-04-18 LAB — URINALYSIS, COMPLETE
Bilirubin, UA: NEGATIVE
Glucose, UA: NEGATIVE
Ketones, UA: NEGATIVE
Nitrite, UA: NEGATIVE
Protein,UA: NEGATIVE
Specific Gravity, UA: 1.01 (ref 1.005–1.030)
Urobilinogen, Ur: 0.2 mg/dL (ref 0.2–1.0)
pH, UA: 6.5 (ref 5.0–7.5)

## 2022-04-19 ENCOUNTER — Telehealth: Payer: Self-pay | Admitting: Nurse Practitioner

## 2022-04-19 MED ORDER — DOXYCYCLINE HYCLATE 100 MG PO TABS: 100.0000 mg | ORAL_TABLET | Freq: Two times a day (BID) | ORAL | 0 refills | Status: DC

## 2022-04-19 NOTE — Telephone Encounter (Signed)
Pt's daughter aware positive for UTI and antibiotic sent in.

## 2022-04-19 NOTE — Telephone Encounter (Signed)
UTI- sent in antibiotic-  Meds ordered this encounter  Medications   doxycycline (VIBRA-TABS) 100 MG tablet    Sig: Take 1 tablet (100 mg total) by mouth 2 (two) times daily. 1 po bid    Dispense:  20 tablet    Refill:  0    Order Specific Question:   Supervising Provider    Answer:   Worthy Rancher [[id number]]   [place], FNP

## 2022-04-21 ENCOUNTER — Other Ambulatory Visit: Payer: Self-pay | Admitting: Family Medicine

## 2022-04-21 LAB — URINE CULTURE

## 2022-04-21 MED ORDER — CIPROFLOXACIN HCL 500 MG PO TABS: 500.0000 mg | ORAL_TABLET | Freq: Two times a day (BID) | ORAL | 0 refills | Status: DC

## 2022-04-21 NOTE — Progress Notes (Signed)
Your urine culture shows the presence of a germ that is resistant to the current antibiotic you are taking. Please discontinue that medication and take the new one I have sent to your pharmacy.  Best Regards, Katelyn Kohlmeyer, M.D.  

## 2022-05-13 ENCOUNTER — Other Ambulatory Visit: Payer: Self-pay | Admitting: Family

## 2022-05-13 ENCOUNTER — Other Ambulatory Visit: Payer: Self-pay | Admitting: Nurse Practitioner

## 2022-05-20 DIAGNOSIS — N3020 Other chronic cystitis without hematuria: Secondary | ICD-10-CM | POA: Diagnosis not present

## 2022-05-20 DIAGNOSIS — N952 Postmenopausal atrophic vaginitis: Secondary | ICD-10-CM | POA: Diagnosis not present

## 2022-05-20 DIAGNOSIS — R300 Dysuria: Secondary | ICD-10-CM | POA: Diagnosis not present

## 2022-05-20 DIAGNOSIS — R351 Nocturia: Secondary | ICD-10-CM | POA: Diagnosis not present

## 2022-05-31 ENCOUNTER — Ambulatory Visit (INDEPENDENT_AMBULATORY_CARE_PROVIDER_SITE_OTHER): Payer: Medicare HMO

## 2022-05-31 DIAGNOSIS — Z23 Encounter for immunization: Secondary | ICD-10-CM | POA: Diagnosis not present

## 2022-07-08 DIAGNOSIS — I700 Atherosclerosis of aorta: Secondary | ICD-10-CM | POA: Diagnosis not present

## 2022-07-08 DIAGNOSIS — R3915 Urgency of urination: Secondary | ICD-10-CM | POA: Diagnosis not present

## 2022-07-08 DIAGNOSIS — N3289 Other specified disorders of bladder: Secondary | ICD-10-CM | POA: Diagnosis not present

## 2022-07-08 DIAGNOSIS — R351 Nocturia: Secondary | ICD-10-CM | POA: Diagnosis not present

## 2022-07-08 DIAGNOSIS — N201 Calculus of ureter: Secondary | ICD-10-CM | POA: Diagnosis not present

## 2022-07-08 DIAGNOSIS — N261 Atrophy of kidney (terminal): Secondary | ICD-10-CM | POA: Diagnosis not present

## 2022-07-08 DIAGNOSIS — N3020 Other chronic cystitis without hematuria: Secondary | ICD-10-CM | POA: Diagnosis not present

## 2022-07-08 DIAGNOSIS — N200 Calculus of kidney: Secondary | ICD-10-CM | POA: Diagnosis not present

## 2022-07-30 DIAGNOSIS — M779 Enthesopathy, unspecified: Secondary | ICD-10-CM | POA: Diagnosis not present

## 2022-07-30 DIAGNOSIS — M79675 Pain in left toe(s): Secondary | ICD-10-CM | POA: Diagnosis not present

## 2022-07-30 DIAGNOSIS — L02612 Cutaneous abscess of left foot: Secondary | ICD-10-CM | POA: Diagnosis not present

## 2022-07-31 DIAGNOSIS — R3915 Urgency of urination: Secondary | ICD-10-CM | POA: Diagnosis not present

## 2022-07-31 DIAGNOSIS — R351 Nocturia: Secondary | ICD-10-CM | POA: Diagnosis not present

## 2022-07-31 DIAGNOSIS — N200 Calculus of kidney: Secondary | ICD-10-CM | POA: Diagnosis not present

## 2022-07-31 DIAGNOSIS — N3020 Other chronic cystitis without hematuria: Secondary | ICD-10-CM | POA: Diagnosis not present

## 2022-08-13 ENCOUNTER — Other Ambulatory Visit: Payer: Self-pay | Admitting: Nurse Practitioner

## 2022-08-29 DIAGNOSIS — N3020 Other chronic cystitis without hematuria: Secondary | ICD-10-CM | POA: Diagnosis not present

## 2022-08-30 ENCOUNTER — Ambulatory Visit (INDEPENDENT_AMBULATORY_CARE_PROVIDER_SITE_OTHER): Payer: Medicare HMO | Admitting: Nurse Practitioner

## 2022-08-30 ENCOUNTER — Encounter: Payer: Self-pay | Admitting: Nurse Practitioner

## 2022-08-30 VITALS — BP 148/69 | HR 68 | Temp 97.3°F | Resp 20 | Ht 70.0 in | Wt 200.0 lb

## 2022-08-30 DIAGNOSIS — E1165 Type 2 diabetes mellitus with hyperglycemia: Secondary | ICD-10-CM | POA: Diagnosis not present

## 2022-08-30 DIAGNOSIS — Z0001 Encounter for general adult medical examination with abnormal findings: Secondary | ICD-10-CM

## 2022-08-30 DIAGNOSIS — F411 Generalized anxiety disorder: Secondary | ICD-10-CM | POA: Diagnosis not present

## 2022-08-30 DIAGNOSIS — J410 Simple chronic bronchitis: Secondary | ICD-10-CM

## 2022-08-30 DIAGNOSIS — I6930 Unspecified sequelae of cerebral infarction: Secondary | ICD-10-CM

## 2022-08-30 DIAGNOSIS — I10 Essential (primary) hypertension: Secondary | ICD-10-CM

## 2022-08-30 DIAGNOSIS — E876 Hypokalemia: Secondary | ICD-10-CM

## 2022-08-30 DIAGNOSIS — F17200 Nicotine dependence, unspecified, uncomplicated: Secondary | ICD-10-CM

## 2022-08-30 DIAGNOSIS — E559 Vitamin D deficiency, unspecified: Secondary | ICD-10-CM

## 2022-08-30 DIAGNOSIS — E785 Hyperlipidemia, unspecified: Secondary | ICD-10-CM | POA: Diagnosis not present

## 2022-08-30 DIAGNOSIS — M818 Other osteoporosis without current pathological fracture: Secondary | ICD-10-CM

## 2022-08-30 DIAGNOSIS — E1169 Type 2 diabetes mellitus with other specified complication: Secondary | ICD-10-CM

## 2022-08-30 DIAGNOSIS — Z7984 Long term (current) use of oral hypoglycemic drugs: Secondary | ICD-10-CM

## 2022-08-30 DIAGNOSIS — Z6825 Body mass index (BMI) 25.0-25.9, adult: Secondary | ICD-10-CM

## 2022-08-30 DIAGNOSIS — K219 Gastro-esophageal reflux disease without esophagitis: Secondary | ICD-10-CM | POA: Diagnosis not present

## 2022-08-30 DIAGNOSIS — E663 Overweight: Secondary | ICD-10-CM

## 2022-08-30 DIAGNOSIS — M160 Bilateral primary osteoarthritis of hip: Secondary | ICD-10-CM

## 2022-08-30 DIAGNOSIS — Z0000 Encounter for general adult medical examination without abnormal findings: Secondary | ICD-10-CM

## 2022-08-30 LAB — BAYER DCA HB A1C WAIVED: HB A1C (BAYER DCA - WAIVED): 7.1 % — ABNORMAL HIGH (ref 4.8–5.6)

## 2022-08-30 MED ORDER — METFORMIN HCL ER 500 MG PO TB24: 500.0000 mg | ORAL_TABLET | Freq: Two times a day (BID) | ORAL | 1 refills | Status: DC

## 2022-08-30 MED ORDER — OMEPRAZOLE 40 MG PO CPDR: 40.0000 mg | DELAYED_RELEASE_CAPSULE | Freq: Every day | ORAL | 1 refills | Status: DC

## 2022-08-30 MED ORDER — ROSUVASTATIN CALCIUM 40 MG PO TABS: ORAL_TABLET | ORAL | 1 refills | Status: DC

## 2022-08-30 MED ORDER — LORAZEPAM 0.5 MG PO TABS: 0.5000 mg | ORAL_TABLET | Freq: Two times a day (BID) | ORAL | 5 refills | Status: DC | PRN

## 2022-08-30 MED ORDER — HYDROXYZINE PAMOATE 25 MG PO CAPS: 25.0000 mg | ORAL_CAPSULE | Freq: Two times a day (BID) | ORAL | 3 refills | Status: DC | PRN

## 2022-08-30 MED ORDER — GABAPENTIN 100 MG PO CAPS: 100.0000 mg | ORAL_CAPSULE | Freq: Three times a day (TID) | ORAL | 2 refills | Status: DC

## 2022-08-30 MED ORDER — TRAMADOL HCL 50 MG PO TABS: 100.0000 mg | ORAL_TABLET | Freq: Two times a day (BID) | ORAL | 5 refills | Status: DC

## 2022-08-30 MED ORDER — FENOFIBRATE 145 MG PO TABS: 145.0000 mg | ORAL_TABLET | Freq: Every day | ORAL | 1 refills | Status: DC

## 2022-08-30 MED ORDER — CHLORTHALIDONE 25 MG PO TABS: 12.5000 mg | ORAL_TABLET | Freq: Every day | ORAL | 1 refills | Status: DC

## 2022-08-30 MED ORDER — AMLODIPINE BESY-BENAZEPRIL HCL 10-40 MG PO CAPS: 1.0000 | ORAL_CAPSULE | Freq: Every day | ORAL | 1 refills | Status: DC

## 2022-08-30 NOTE — Progress Notes (Signed)
Subjective:    Patient ID: Caitlin Mccarthy, female    DOB: Nov 08, 1935, 87 y.o.   MRN: [id number]   Chief Complaint: .annual physical   HPI:  Caitlin Mccarthy is a 87 y.o. who identifies as a female who was assigned female at birth.   Social history: Lives with: husband Work history: retired   Scientist, forensic in today for follow up of the following chronic medical issues:  1. Annual physical exam No pap  2. Essential hypertension, benign No c/o chest pain, sob or headache. Does not check blood pressure at home. BP Readings from Last 3 Encounters:  08/30/22 (!) 148/69  02/26/22 (!) 142/73  11/22/21 (!) 142/78      3. Simple chronic bronchitis (HCC) Has occasional cough. Is on no inhalers  4. Gastroesophageal reflux disease without esophagitis Is on omeprazole daily and is doing well.  5. Hyperlipidemia due to type 2 diabetes mellitus ([place]) Eats whatever her daughters prepare for her. Does no exercise. Lab Results  Component Value Date   CHOL 180 02/26/2022   HDL 45 02/26/2022   LDLCALC 94 02/26/2022   TRIG 241 (H) 02/26/2022   CHOLHDL 4.0 02/26/2022     6. Type 2 diabetes mellitus with hyperglycemia, without long-term current use of insulin (HCC) Fasting blood sugars are running around 110-150..no low blood sugars Lab Results  Component Value Date   HGBA1C 6.5 (H) 02/26/2022     7. Hypokalemia No c/o of muscle cramps. Is on potassium supplement daily Lab Results  Component Value Date   K 4.9 02/26/2022     8. Late effect of cerebrovascular accident (CVA) No permanent effects  9. Other osteoporosis without current pathological fracture Last dexascan was done in 2017. No longer wants to do. She does no dedicated weight bearing exercise. Pain assessment: Cause of pain- osteoarthritis and osteoporosis Pain location- varies Pain on scale of 1-10- 7/10 Frequency- daily What increases pain-standing and walking What makes pain Better-rest helps Effects  on ADL - none Any change in general medical condition-none  Current opioids rx- ultram # meds rx- 60 Effectiveness of current meds-helps Adverse reactions from pain meds-none Morphine equivalent- 20MME  Pill count performed-No Last drug screen - 08/13/21 ( high risk q14m moderate risk q621mlow risk yearly ) Urine drug screen today- Yes Was the [facility] [place]- yes  If yes were their any concerning findings? - no   Overdose risk: 1    Pain contract signed on:08/30/22   10. Vitamin D deficiency Forgets to take supplement daily  11. Current smoker Smokes over a pack a day  12. Overweight (BMI 25.0-29.9) Weight is up 7lbs Wt Readings from Last 3 Encounters:  08/30/22 200 lb (90.7 kg)  02/26/22 193 lb (87.5 kg)  02/05/22 203 lb (92.1 kg)   BMI Readings from Last 3 Encounters:  08/30/22 28.70 kg/m  02/26/22 27.69 kg/m  02/05/22 29.13 kg/m       New complaints: None today  Allergies  Allergen Reactions   Iodinated Contrast Media    Latex    Outpatient Encounter Medications as of 08/30/2022  Medication Sig   nystatin (MYCOSTATIN/NYSTOP) powder APPLY TWICE DAILY   Alcohol Swabs (B-D SINGLE USE SWABS REGULAR) PADS Test BS daily and as needed Dx E11.65   amLODipine-benazepril (LOTREL) 10-40 MG capsule Take 1 capsule by mouth daily.   aspirin EC 81 MG tablet Take 1 tablet (81 mg total) by mouth daily.   Baclofen 5 MG TABS Take 5 mg by mouth 3 (  three) times daily as needed.   Blood Glucose Calibration (TRUE METRIX LEVEL 1) Low SOLN Use with glucose monitor Dx E11.65   Blood Glucose Monitoring Suppl (TRUE METRIX AIR GLUCOSE METER) w/Device KIT Test BS daily and as needed Dx E11.65   chlorthalidone (HYGROTON) 25 MG tablet Take 0.5 tablets (12.5 mg total) by mouth daily.   cholecalciferol (VITAMIN D3) 25 MCG (1000 UNIT) tablet Take 1,000 Units by mouth daily.   ciprofloxacin (CIPRO) 500 MG tablet Take 1 tablet (500 mg total) by mouth 2 (two) times daily.   fenofibrate  (TRICOR) 145 MG tablet Take 1 tablet (145 mg total) by mouth daily.   fluticasone (FLONASE) 50 MCG/ACT nasal spray Place 2 sprays into both nostrils daily.   gabapentin (NEURONTIN) 100 MG capsule Take 1 capsule (100 mg total) by mouth 3 (three) times daily.   glucose blood (TRUE METRIX BLOOD GLUCOSE TEST) test strip Test BS daily and as needed Dx E11.65   guaiFENesin (MUCINEX) 600 MG 12 hr tablet Take 1 tablet (600 mg total) by mouth 2 (two) times daily.   hydrOXYzine (VISTARIL) 25 MG capsule Take 1 capsule (25 mg total) by mouth 2 (two) times daily as needed.   LORazepam (ATIVAN) 0.5 MG tablet TAKE 1 TABLET BY MOUTH TWICE DAILY AS NEEDED FOR ANXIETY   metFORMIN (GLUCOPHAGE-XR) 500 MG 24 hr tablet Take 1 tablet (500 mg total) by mouth in the morning and at bedtime.   methocarbamol (ROBAXIN) 500 MG tablet    nystatin (MYCOSTATIN/NYSTOP) powder APPLY TOPICALLY FOUR TIMES DAILY   omeprazole (PRILOSEC) 40 MG capsule Take 1 capsule (40 mg total) by mouth daily.   ondansetron (ZOFRAN) 4 MG tablet Take 1 tablet (4 mg total) by mouth every 8 (eight) hours as needed for nausea or vomiting.   ondansetron (ZOFRAN-ODT) 4 MG disintegrating tablet TAKE 1 TABLET EVERY 8 HOURS AS NEEDED FOR NAUSEA AND VOMITING   polyethylene glycol (MIRALAX / GLYCOLAX) 17 g packet Take 17 g by mouth daily.   PREMARIN vaginal cream Apply 1 application topically daily as needed (irritation).    rosuvastatin (CRESTOR) 40 MG tablet TAKE ONE (1) TABLET EACH DAY   traMADol (ULTRAM) 50 MG tablet Take 2 tablets (100 mg total) by mouth 2 (two) times daily. TAKE ONE TABLET FOUR TIMES DAILY   TRUEplus Lancets 33G MISC Test BS daily and as needed Dx E11.65   No facility-administered encounter medications on file as of 08/30/2022.    Past Surgical History:  Procedure Laterality Date   CATARACT EXTRACTION W/PHACO  06/06/2011   Procedure: CATARACT EXTRACTION PHACO AND INTRAOCULAR LENS PLACEMENT (IOC);  Surgeon: Tonny Branch;  Location: [facility];   Service: Ophthalmology;  Laterality: Right;  CDE=12.77   CATARACT EXTRACTION W/PHACO  06/27/2011   Procedure: CATARACT EXTRACTION PHACO AND INTRAOCULAR LENS PLACEMENT (IOC);  Surgeon: Tonny Branch;  Location: [facility];  Service: Ophthalmology;  Laterality: Left;  CDE:13.96   CYSTOSCOPY WITH URETEROSCOPY AND STENT PLACEMENT Right 04/27/2015   Procedure: CYSTOSCOPY WITH RIGHT URETEROSCOPY, BASKET REMOVAL OF STONE, REMOVAL OF RIGHT NEPHROSTOMY TUBE;  Surgeon: Irine Seal, MD;  Location: [place];  Service: Urology;  Laterality: Right;   CYSTOSCOPY/URETEROSCOPY/HOLMIUM LASER/STENT PLACEMENT Left 12/24/2016   Procedure: LEFT URETEROSCOPY WITH HOLMIUM LASER AND STENT PLACEMENT;  Surgeon: Irine Seal, MD;  Location: [facility];  Service: Urology;  Laterality: Left;   EXTRACORPOREAL SHOCK WAVE LITHOTRIPSY  left 04-01-2016;  1980's   FRACTURE SURGERY     HIP ARTHROPLASTY  HOLMIUM LASER APPLICATION Right [id number]   Procedure: HOLMIUM LASER APPLICATION;  Surgeon: Irine Seal, MD;  Location: [facility];  Service: Urology;  Laterality: Right;   KNEE ARTHROPLASTY     KNEE ARTHROSCOPY Bilateral right 2005//  left ?   LOOP RECORDER INSERTION N/A 04/01/2017   Procedure: LOOP RECORDER INSERTION;  Surgeon: Thompson Grayer, MD;  Location: [place] [facility];  Service: Cardiovascular;  Laterality: N/A;   TOTAL HIP ARTHROPLASTY  10/28/2011   Procedure: TOTAL HIP ARTHROPLASTY;  Surgeon: Gearlean Alf, MD;  Location: [facility];  Service: Orthopedics;  Laterality: Right;   TOTAL KNEE ARTHROPLASTY Bilateral left 03-09-2007//  right 2006   TRANSTHORACIC ECHOCARDIOGRAM  10/06/2006   normal echo,  ef 55-60%   TUBAL LIGATION      Family History  Problem Relation Age of Onset   Kidney disease Mother    Diabetes Mother    Congestive Heart Failure Father    Heart failure Father    Heart disease Brother    Alcohol abuse Brother    Anesthesia problems Neg Hx    Hypotension  Neg Hx    Malignant hyperthermia Neg Hx    Pseudochol deficiency Neg Hx       Controlled substance contract: n/a     Review of Systems  Constitutional:  Negative for diaphoresis.  Eyes:  Negative for pain.  Respiratory:  Negative for shortness of breath.   Cardiovascular:  Negative for chest pain, palpitations and leg swelling.  Gastrointestinal:  Negative for abdominal pain.  Endocrine: Negative for polydipsia.  Skin:  Negative for rash.  Neurological:  Negative for dizziness, weakness and headaches.  Hematological:  Does not bruise/bleed easily.  All other systems reviewed and are negative.      Objective:   Physical Exam Vitals and nursing note reviewed.  Constitutional:      General: She is not in acute distress.    Appearance: Normal appearance. She is well-developed.  HENT:     Head: Normocephalic.     Right Ear: Tympanic membrane normal.     Left Ear: Tympanic membrane normal.     Nose: Nose normal.     Mouth/Throat:     Mouth: Mucous membranes are moist.  Eyes:     Pupils: Pupils are equal, round, and reactive to light.  Neck:     Vascular: No carotid bruit or JVD.  Cardiovascular:     Rate and Rhythm: Normal rate and regular rhythm.     Heart sounds: Normal heart sounds.  Pulmonary:     Effort: Pulmonary effort is normal. No respiratory distress.     Breath sounds: Normal breath sounds. No wheezing or rales.  Chest:     Chest wall: No tenderness.  Abdominal:     General: Bowel sounds are normal. There is no distension or abdominal bruit.     Palpations: Abdomen is soft. There is no hepatomegaly, splenomegaly, mass or pulsatile mass.     Tenderness: There is no abdominal tenderness.  Musculoskeletal:        General: Normal range of motion.     Cervical back: Normal range of motion and neck supple.  Lymphadenopathy:     Cervical: No cervical adenopathy.  Skin:    General: Skin is warm and dry.  Neurological:     Mental Status: She is alert and  oriented to person, place, and time.     Deep Tendon Reflexes: Reflexes are normal and symmetric.  Psychiatric:  Behavior: Behavior normal.        Thought Content: Thought content normal.        Judgment: Judgment normal.     BP (!) 148/69   Pulse 68   Temp (!) 97.3 F (36.3 C) (Temporal)   Resp 20   Ht '5\' 10"'$  (1.778 m)   Wt 200 lb (90.7 kg)   SpO2 95%   BMI 28.70 kg/m   HGBA1c 7.1%     Caitlin Mccarthy comes in today with chief complaint of Annual Exam   Diagnosis and orders addressed:  1. Annual physical exam  2. Essential hypertension, benign Low sodium - CBC with Differential/Platelet - CMP14+EGFR - amLODipine-benazepril (LOTREL) 10-40 MG capsule; Take 1 capsule by mouth daily.  Dispense: 90 capsule; Refill: 1 - chlorthalidone (HYGROTON) 25 MG tablet; Take 0.5 tablets (12.5 mg total) by mouth daily.  Dispense: 90 tablet; Refill: 1  3. Simple chronic bronchitis ([place])  4. Gastroesophageal reflux disease without esophagitis Avoid spicy foods Do not eat 2 hours prior to bedtime  - omeprazole (PRILOSEC) 40 MG capsule; Take 1 capsule (40 mg total) by mouth daily.  Dispense: 90 capsule; Refill: 1  5. Hyperlipidemia due to type 2 diabetes mellitus (HCC) Low fat diet - Lipid panel - fenofibrate (TRICOR) 145 MG tablet; Take 1 tablet (145 mg total) by mouth daily.  Dispense: 90 tablet; Refill: 1 - rosuvastatin (CRESTOR) 40 MG tablet; TAKE ONE (1) TABLET EACH DAY  Dispense: 90 tablet; Refill: 1  6. Type 2 diabetes mellitus with hyperglycemia, without long-term current use of insulin (HCC) Continue to watch carbs in diet - Bayer DCA Hb A1c Waived - gabapentin (NEURONTIN) 100 MG capsule; Take 1 capsule (100 mg total) by mouth 3 (three) times daily.  Dispense: 90 capsule; Refill: 2 - metFORMIN (GLUCOPHAGE-XR) 500 MG 24 hr tablet; Take 1 tablet (500 mg total) by mouth in the morning and at bedtime.  Dispense: 180 tablet; Refill: 1  7. Hypokalemia Labs  pending  8. Late effect of cerebrovascular accident (CVA) Fall prevention  9. Other osteoporosis without current pathological fracture Weight bearing exercises when can tolerate  10. Vitamin D deficiency Encouraged to take vitamin d supplement daily  11. Current smoker Smoking cessation encouraged  12. Overweight (BMI 25.0-29.9) .Discussed diet and exercise for person with BMI >25 Will recheck weight in 3-6 months   13. GAD (generalized anxiety disorder) Stress management - ToxASSURE Select 13 (MW), Urine - hydrOXYzine (VISTARIL) 25 MG capsule; Take 1 capsule (25 mg total) by mouth 2 (two) times daily as needed.  Dispense: 60 capsule; Refill: 3 - LORazepam (ATIVAN) 0.5 MG tablet; Take 1 tablet (0.5 mg total) by mouth 2 (two) times daily as needed. for anxiety  Dispense: 30 tablet; Refill: 5  14. Primary osteoarthritis of both hips - traMADol (ULTRAM) 50 MG tablet; Take 2 tablets (100 mg total) by mouth 2 (two) times daily. TAKE ONE TABLET FOUR TIMES DAILY  Dispense: 60 tablet; Refill: 5   Labs pending Health Maintenance reviewed Diet and exercise encouraged  Follow up plan: 6 months   Mary-Margaret Hassell Done, FNP

## 2022-08-30 NOTE — Patient Instructions (Signed)
Fall Prevention in the Home, Adult Falls can cause injuries and can happen to people of all ages. There are many things you can do to make your home safer and to help prevent falls. What actions can I take to prevent falls? General information Use good lighting in all rooms. Make sure to: Replace any light bulbs that burn out. Turn on the lights in dark areas and use night-lights. Keep items that you use often in easy-to-reach places. Lower the shelves around your home if needed. Move furniture so that there are clear paths around it. Do not use throw rugs or other things on the floor that can make you trip. If any of your floors are uneven, fix them. Add color or contrast paint or tape to clearly mark and help you see: Grab bars or handrails. First and last steps of staircases. Where the edge of each step is. If you use a ladder or stepladder: Make sure that it is fully opened. Do not climb a closed ladder. Make sure the sides of the ladder are locked in place. Have someone hold the ladder while you use it. Know where your pets are as you move through your home. What can I do in the bathroom?     Keep the floor dry. Clean up any water on the floor right away. Remove soap buildup in the bathtub or shower. Buildup makes bathtubs and showers slippery. Use non-skid mats or decals on the floor of the bathtub or shower. Attach bath mats securely with double-sided, non-slip rug tape. If you need to sit down in the shower, use a non-slip stool. Install grab bars by the toilet and in the bathtub and shower. Do not use towel bars as grab bars. What can I do in the bedroom? Make sure that you have a light by your bed that is easy to reach. Do not use any sheets or blankets on your bed that hang to the floor. Have a firm chair or bench with side arms that you can use for support when you get dressed. What can I do in the kitchen? Clean up any spills right away. If you need to reach something  above you, use a step stool with a grab bar. Keep electrical cords out of the way. Do not use floor polish or wax that makes floors slippery. What can I do with my stairs? Do not leave anything on the stairs. Make sure that you have a light switch at the top and the bottom of the stairs. Make sure that there are handrails on both sides of the stairs. Fix handrails that are broken or loose. Install non-slip stair treads on all your stairs if they do not have carpet. Avoid having throw rugs at the top or bottom of the stairs. Choose a carpet that does not hide the edge of the steps on the stairs. Make sure that the carpet is firmly attached to the stairs. Fix carpet that is loose or worn. What can I do on the outside of my home? Use bright outdoor lighting. Fix the edges of walkways and driveways and fix any cracks. Clear paths of anything that can make you trip, such as tools or rocks. Add color or contrast paint or tape to clearly mark and help you see anything that might make you trip as you walk through a door, such as a raised step or threshold. Trim any bushes or trees on paths to your home. Check to see if handrails are loose  or broken and that both sides of all steps have handrails. Install guardrails along the edges of any raised decks and porches. Have leaves, snow, or ice cleared regularly. Use sand, salt, or ice melter on paths if you live where there is ice and snow during the winter. Clean up any spills in your garage right away. This includes grease or oil spills. What other actions can I take? Review your medicines with your doctor. Some medicines can cause dizziness or changes in blood pressure, which increase your risk of falling. Wear shoes that: Have a low heel. Do not wear high heels. Have rubber bottoms and are closed at the toe. Feel good on your feet and fit well. Use tools that help you move around if needed. These include: Canes. Walkers. Scooters. Crutches. Ask  your doctor what else you can do to help prevent falls. This may include seeing a physical therapist to learn to do exercises to move better and get stronger. Where to find more information Centers for Disease Control and Prevention, [employer]: StoreMirror.com.cy [employer] [place] on Aging: AquariamTheater.co.nz National Institute on Aging: AquariamTheater.co.nz Contact a doctor if: You are afraid of falling at home. You feel weak, drowsy, or dizzy at home. You fall at home. Get help right away if you: Lose consciousness or have trouble moving after a fall. Have a fall that causes a head injury. These symptoms may be an emergency. Get help right away. Call 911. Do not wait to see if the symptoms will go away. Do not drive yourself to the hospital. This information is not intended to replace advice given to you by your health care provider. Make sure you discuss any questions you have with your health care provider. Document Revised: 02/18/2022 Document Reviewed: 02/18/2022 Elsevier Patient Education  [place].

## 2022-08-31 LAB — CBC WITH DIFFERENTIAL/PLATELET
Basophils Absolute: 0.1 10*3/uL (ref 0.0–0.2)
Basos: 1 %
EOS (ABSOLUTE): 0.1 10*3/uL (ref 0.0–0.4)
Eos: 1 %
Hematocrit: 41.9 % (ref 34.0–46.6)
Hemoglobin: 14.2 g/dL (ref 11.1–15.9)
Immature Grans (Abs): 0 10*3/uL (ref 0.0–0.1)
Immature Granulocytes: 0 %
Lymphocytes Absolute: 2.9 10*3/uL (ref 0.7–3.1)
Lymphs: 40 %
MCH: 31.8 pg (ref 26.6–33.0)
MCHC: 33.9 g/dL (ref 31.5–35.7)
MCV: 94 fL (ref 79–97)
Monocytes Absolute: 0.6 10*3/uL (ref 0.1–0.9)
Monocytes: 9 %
Neutrophils Absolute: 3.5 10*3/uL (ref 1.4–7.0)
Neutrophils: 49 %
Platelets: 217 10*3/uL (ref 150–450)
RBC: 4.46 x10E6/uL (ref 3.77–5.28)
RDW: 11.9 % (ref 11.7–15.4)
WBC: 7.2 10*3/uL (ref 3.4–10.8)

## 2022-08-31 LAB — LIPID PANEL
Chol/HDL Ratio: 4.4 ratio (ref 0.0–4.4)
Cholesterol, Total: 198 mg/dL (ref 100–199)
HDL: 45 mg/dL (ref >39)
LDL Chol Calc (NIH): 103 mg/dL — ABNORMAL HIGH (ref 0–99)
Triglycerides: 296 mg/dL — ABNORMAL HIGH (ref 0–149)
VLDL Cholesterol Cal: 50 mg/dL — ABNORMAL HIGH (ref 5–40)

## 2022-08-31 LAB — CMP14+EGFR
ALT: 13 IU/L (ref 0–32)
AST: 17 IU/L (ref 0–40)
Albumin/Globulin Ratio: 1.4 (ref 1.2–2.2)
Albumin: 4.5 g/dL (ref 3.7–4.7)
Alkaline Phosphatase: 56 IU/L (ref 44–121)
BUN/Creatinine Ratio: 27 (ref 12–28)
BUN: 25 mg/dL (ref 8–27)
Bilirubin Total: 0.3 mg/dL (ref 0.0–1.2)
CO2: 22 mmol/L (ref 20–29)
Calcium: 11.6 mg/dL — ABNORMAL HIGH (ref 8.7–10.3)
Chloride: 102 mmol/L (ref 96–106)
Creatinine, Ser: 0.93 mg/dL (ref 0.57–1.00)
Globulin, Total: 3.2 g/dL (ref 1.5–4.5)
Glucose: 126 mg/dL — ABNORMAL HIGH (ref 70–99)
Potassium: 4.7 mmol/L (ref 3.5–5.2)
Sodium: 141 mmol/L (ref 134–144)
Total Protein: 7.7 g/dL (ref 6.0–8.5)
eGFR: 61 mL/min/{1.73_m2} (ref >59)

## 2022-09-03 LAB — TOXASSURE SELECT 13 (MW), URINE

## 2022-09-05 DIAGNOSIS — N3020 Other chronic cystitis without hematuria: Secondary | ICD-10-CM | POA: Diagnosis not present

## 2022-09-05 DIAGNOSIS — R8271 Bacteriuria: Secondary | ICD-10-CM | POA: Diagnosis not present

## 2022-09-10 DIAGNOSIS — E1151 Type 2 diabetes mellitus with diabetic peripheral angiopathy without gangrene: Secondary | ICD-10-CM | POA: Diagnosis not present

## 2022-09-10 DIAGNOSIS — L84 Corns and callosities: Secondary | ICD-10-CM | POA: Diagnosis not present

## 2022-09-10 DIAGNOSIS — M79676 Pain in unspecified toe(s): Secondary | ICD-10-CM | POA: Diagnosis not present

## 2022-09-10 DIAGNOSIS — B351 Tinea unguium: Secondary | ICD-10-CM | POA: Diagnosis not present

## 2022-09-12 DIAGNOSIS — N3000 Acute cystitis without hematuria: Secondary | ICD-10-CM | POA: Diagnosis not present

## 2022-09-19 DIAGNOSIS — N3020 Other chronic cystitis without hematuria: Secondary | ICD-10-CM | POA: Diagnosis not present

## 2022-09-26 DIAGNOSIS — N3020 Other chronic cystitis without hematuria: Secondary | ICD-10-CM | POA: Diagnosis not present

## 2022-09-26 DIAGNOSIS — R8271 Bacteriuria: Secondary | ICD-10-CM | POA: Diagnosis not present

## 2022-10-03 DIAGNOSIS — N3020 Other chronic cystitis without hematuria: Secondary | ICD-10-CM | POA: Diagnosis not present

## 2022-10-15 DIAGNOSIS — N3020 Other chronic cystitis without hematuria: Secondary | ICD-10-CM | POA: Diagnosis not present

## 2022-10-15 DIAGNOSIS — R300 Dysuria: Secondary | ICD-10-CM | POA: Diagnosis not present

## 2022-10-22 DIAGNOSIS — N3020 Other chronic cystitis without hematuria: Secondary | ICD-10-CM | POA: Diagnosis not present

## 2022-10-25 ENCOUNTER — Other Ambulatory Visit: Payer: Self-pay | Admitting: Nurse Practitioner

## 2022-10-25 DIAGNOSIS — E1169 Type 2 diabetes mellitus with other specified complication: Secondary | ICD-10-CM

## 2022-10-29 DIAGNOSIS — R8271 Bacteriuria: Secondary | ICD-10-CM | POA: Diagnosis not present

## 2022-10-29 DIAGNOSIS — N3020 Other chronic cystitis without hematuria: Secondary | ICD-10-CM | POA: Diagnosis not present

## 2022-10-31 ENCOUNTER — Encounter: Payer: Self-pay | Admitting: Family Medicine

## 2022-10-31 ENCOUNTER — Telehealth (INDEPENDENT_AMBULATORY_CARE_PROVIDER_SITE_OTHER): Payer: Medicare HMO | Admitting: Family Medicine

## 2022-10-31 DIAGNOSIS — J0140 Acute pansinusitis, unspecified: Secondary | ICD-10-CM | POA: Diagnosis not present

## 2022-10-31 DIAGNOSIS — J301 Allergic rhinitis due to pollen: Secondary | ICD-10-CM

## 2022-10-31 MED ORDER — LEVOCETIRIZINE DIHYDROCHLORIDE 5 MG PO TABS: 5.0000 mg | ORAL_TABLET | Freq: Every evening | ORAL | 0 refills | Status: DC

## 2022-10-31 MED ORDER — AMOXICILLIN 875 MG PO TABS: 875.0000 mg | ORAL_TABLET | Freq: Two times a day (BID) | ORAL | 0 refills | Status: DC

## 2022-10-31 NOTE — Progress Notes (Signed)
   Virtual Visit  Note Due to COVID-19 pandemic this visit was conducted virtually. This visit type was conducted due to national recommendations for restrictions regarding the COVID-19 Pandemic (e.g. social distancing, sheltering in place) in an effort to limit this patient's exposure and mitigate transmission in our community. All issues noted in this document were discussed and addressed.  A physical exam was not performed with this format.  I connected with Caitlin Mccarthy on 10/31/22 at 1358 by telephone and verified that I am speaking with the correct person using two identifiers. Caitlin Mccarthy is currently located at home and her daughter is currently with her during the visit. The provider, Gabriel Earing, FNP is located in their office at time of visit.  I discussed the limitations, risks, security and privacy concerns of performing an evaluation and management service by telephone and the availability of in person appointments. I also discussed with the patient that there may be a patient responsible charge related to this service. The patient expressed understanding and agreed to proceed.  CC: sinusitis  History and Present Illness:  HPI Caitlin Mccarthy reports runny nose and nasal congestion for weeks due to seasonal allergies. She reports increase nasal congestion, sinus pressure, and dry cough for the last 3 days. She has been taking OTC cold medications without improvement. She denies fever, chills, sore throat, chest pain, or shortness of breath.    ROS As per HPI.   Observations/Objective: Alert and oriented. Respirations unlabored. No cyanosis. Non toxic appearing. Normal mood and behavior.   Assessment and Plan: Caitlin Mccarthy was seen today for sinusitis.  Diagnoses and all orders for this visit:  Seasonal allergic rhinitis due to pollen Xyzal as below through pollen season.  -     levocetirizine (XYZAL) 5 MG tablet; Take 1 tablet (5 mg total) by mouth every  evening.  Acute non-recurrent pansinusitis Amoxicillin as below. Discussed symptomatic care and return precautions.  -     amoxicillin (AMOXIL) 875 MG tablet; Take 1 tablet (875 mg total) by mouth 2 (two) times daily for 7 days.     Follow Up Instructions: As needed.     I discussed the assessment and treatment plan with the patient. The patient was provided an opportunity to ask questions and all were answered. The patient agreed with the plan and demonstrated an understanding of the instructions.   The patient was advised to call back or seek an in-person evaluation if the symptoms worsen or if the condition fails to improve as anticipated.  The above assessment and management plan was discussed with the patient. The patient verbalized understanding of and has agreed to the management plan. Patient is aware to call the clinic if symptoms persist or worsen. Patient is aware when to return to the clinic for a follow-up visit. Patient educated on when it is appropriate to go to the emergency department.   Time call ended:  1403  I provided 5 minutes of  non face-to-face time during this encounter.    Gabriel Earing, FNP

## 2022-11-02 ENCOUNTER — Inpatient Hospital Stay (HOSPITAL_COMMUNITY)
Admission: EM | Admit: 2022-11-02 | Discharge: 2022-11-05 | DRG: 190 | Disposition: A | Discharge status: Home / Self Care | Payer: Medicare HMO | Attending: Internal Medicine | Admitting: Internal Medicine

## 2022-11-02 ENCOUNTER — Emergency Department (HOSPITAL_COMMUNITY): Payer: Medicare HMO

## 2022-11-02 ENCOUNTER — Encounter (HOSPITAL_COMMUNITY): Payer: Self-pay

## 2022-11-02 ENCOUNTER — Other Ambulatory Visit: Payer: Self-pay

## 2022-11-02 DIAGNOSIS — Z66 Do not resuscitate: Secondary | ICD-10-CM | POA: Diagnosis present

## 2022-11-02 DIAGNOSIS — J9621 Acute and chronic respiratory failure with hypoxia: Secondary | ICD-10-CM | POA: Diagnosis present

## 2022-11-02 DIAGNOSIS — M19042 Primary osteoarthritis, left hand: Secondary | ICD-10-CM | POA: Diagnosis present

## 2022-11-02 DIAGNOSIS — Z79899 Other long term (current) drug therapy: Secondary | ICD-10-CM

## 2022-11-02 DIAGNOSIS — Z1152 Encounter for screening for COVID-19: Secondary | ICD-10-CM | POA: Diagnosis not present

## 2022-11-02 DIAGNOSIS — Z7984 Long term (current) use of oral hypoglycemic drugs: Secondary | ICD-10-CM

## 2022-11-02 DIAGNOSIS — R0602 Shortness of breath: Secondary | ICD-10-CM | POA: Diagnosis not present

## 2022-11-02 DIAGNOSIS — Z96641 Presence of right artificial hip joint: Secondary | ICD-10-CM | POA: Diagnosis present

## 2022-11-02 DIAGNOSIS — N179 Acute kidney failure, unspecified: Secondary | ICD-10-CM | POA: Diagnosis not present

## 2022-11-02 DIAGNOSIS — K219 Gastro-esophageal reflux disease without esophagitis: Secondary | ICD-10-CM | POA: Diagnosis present

## 2022-11-02 DIAGNOSIS — J9601 Acute respiratory failure with hypoxia: Principal | ICD-10-CM

## 2022-11-02 DIAGNOSIS — F419 Anxiety disorder, unspecified: Secondary | ICD-10-CM | POA: Diagnosis present

## 2022-11-02 DIAGNOSIS — M810 Age-related osteoporosis without current pathological fracture: Secondary | ICD-10-CM | POA: Diagnosis present

## 2022-11-02 DIAGNOSIS — Z87440 Personal history of urinary (tract) infections: Secondary | ICD-10-CM

## 2022-11-02 DIAGNOSIS — M19041 Primary osteoarthritis, right hand: Secondary | ICD-10-CM | POA: Diagnosis present

## 2022-11-02 DIAGNOSIS — E871 Hypo-osmolality and hyponatremia: Secondary | ICD-10-CM | POA: Diagnosis present

## 2022-11-02 DIAGNOSIS — N1831 Chronic kidney disease, stage 3a: Secondary | ICD-10-CM | POA: Diagnosis not present

## 2022-11-02 DIAGNOSIS — I5021 Acute systolic (congestive) heart failure: Secondary | ICD-10-CM | POA: Diagnosis not present

## 2022-11-02 DIAGNOSIS — M160 Bilateral primary osteoarthritis of hip: Secondary | ICD-10-CM | POA: Diagnosis present

## 2022-11-02 DIAGNOSIS — E782 Mixed hyperlipidemia: Secondary | ICD-10-CM | POA: Diagnosis present

## 2022-11-02 DIAGNOSIS — I130 Hypertensive heart and chronic kidney disease with heart failure and stage 1 through stage 4 chronic kidney disease, or unspecified chronic kidney disease: Secondary | ICD-10-CM | POA: Diagnosis not present

## 2022-11-02 DIAGNOSIS — M170 Bilateral primary osteoarthritis of knee: Secondary | ICD-10-CM | POA: Diagnosis present

## 2022-11-02 DIAGNOSIS — E1165 Type 2 diabetes mellitus with hyperglycemia: Secondary | ICD-10-CM

## 2022-11-02 DIAGNOSIS — Z91040 Latex allergy status: Secondary | ICD-10-CM

## 2022-11-02 DIAGNOSIS — Z833 Family history of diabetes mellitus: Secondary | ICD-10-CM

## 2022-11-02 DIAGNOSIS — I10 Essential (primary) hypertension: Secondary | ICD-10-CM | POA: Diagnosis present

## 2022-11-02 DIAGNOSIS — Z79890 Hormone replacement therapy (postmenopausal): Secondary | ICD-10-CM

## 2022-11-02 DIAGNOSIS — Z7982 Long term (current) use of aspirin: Secondary | ICD-10-CM

## 2022-11-02 DIAGNOSIS — Z8673 Personal history of transient ischemic attack (TIA), and cerebral infarction without residual deficits: Secondary | ICD-10-CM

## 2022-11-02 DIAGNOSIS — F17210 Nicotine dependence, cigarettes, uncomplicated: Secondary | ICD-10-CM | POA: Diagnosis present

## 2022-11-02 DIAGNOSIS — G8929 Other chronic pain: Secondary | ICD-10-CM | POA: Diagnosis present

## 2022-11-02 DIAGNOSIS — U071 COVID-19: Secondary | ICD-10-CM | POA: Diagnosis not present

## 2022-11-02 DIAGNOSIS — E1169 Type 2 diabetes mellitus with other specified complication: Secondary | ICD-10-CM | POA: Diagnosis not present

## 2022-11-02 DIAGNOSIS — Z841 Family history of disorders of kidney and ureter: Secondary | ICD-10-CM

## 2022-11-02 DIAGNOSIS — J810 Acute pulmonary edema: Secondary | ICD-10-CM

## 2022-11-02 DIAGNOSIS — E1122 Type 2 diabetes mellitus with diabetic chronic kidney disease: Secondary | ICD-10-CM | POA: Diagnosis not present

## 2022-11-02 DIAGNOSIS — Z716 Tobacco abuse counseling: Secondary | ICD-10-CM

## 2022-11-02 DIAGNOSIS — Z8249 Family history of ischemic heart disease and other diseases of the circulatory system: Secondary | ICD-10-CM

## 2022-11-02 DIAGNOSIS — E119 Type 2 diabetes mellitus without complications: Secondary | ICD-10-CM

## 2022-11-02 DIAGNOSIS — Z96653 Presence of artificial knee joint, bilateral: Secondary | ICD-10-CM | POA: Diagnosis present

## 2022-11-02 DIAGNOSIS — Z91041 Radiographic dye allergy status: Secondary | ICD-10-CM

## 2022-11-02 DIAGNOSIS — J441 Chronic obstructive pulmonary disease with (acute) exacerbation: Principal | ICD-10-CM | POA: Diagnosis present

## 2022-11-02 DIAGNOSIS — Z961 Presence of intraocular lens: Secondary | ICD-10-CM | POA: Diagnosis present

## 2022-11-02 DIAGNOSIS — F32A Depression, unspecified: Secondary | ICD-10-CM | POA: Diagnosis present

## 2022-11-02 DIAGNOSIS — J449 Chronic obstructive pulmonary disease, unspecified: Secondary | ICD-10-CM | POA: Diagnosis present

## 2022-11-02 DIAGNOSIS — E785 Hyperlipidemia, unspecified: Secondary | ICD-10-CM | POA: Diagnosis not present

## 2022-11-02 DIAGNOSIS — J811 Chronic pulmonary edema: Secondary | ICD-10-CM | POA: Diagnosis not present

## 2022-11-02 DIAGNOSIS — R918 Other nonspecific abnormal finding of lung field: Secondary | ICD-10-CM | POA: Diagnosis not present

## 2022-11-02 DIAGNOSIS — Z87442 Personal history of urinary calculi: Secondary | ICD-10-CM

## 2022-11-02 LAB — COMPREHENSIVE METABOLIC PANEL
ALT: 19 U/L (ref 0–44)
AST: 26 U/L (ref 15–41)
Albumin: 4 g/dL (ref 3.5–5.0)
Alkaline Phosphatase: 45 U/L (ref 38–126)
Anion gap: 8 (ref 5–15)
BUN: 21 mg/dL (ref 8–23)
CO2: 28 mmol/L (ref 22–32)
Calcium: 10.7 mg/dL — ABNORMAL HIGH (ref 8.9–10.3)
Chloride: 97 mmol/L — ABNORMAL LOW (ref 98–111)
Creatinine, Ser: 1.01 mg/dL — ABNORMAL HIGH (ref 0.44–1.00)
GFR, Estimated: 55 mL/min — ABNORMAL LOW (ref >60)
Glucose, Bld: 125 mg/dL — ABNORMAL HIGH (ref 70–99)
Potassium: 4.1 mmol/L (ref 3.5–5.1)
Sodium: 133 mmol/L — ABNORMAL LOW (ref 135–145)
Total Bilirubin: 0.6 mg/dL (ref 0.3–1.2)
Total Protein: 7.8 g/dL (ref 6.5–8.1)

## 2022-11-02 LAB — CBC WITH DIFFERENTIAL/PLATELET
Abs Immature Granulocytes: 0.02 10*3/uL (ref 0.00–0.07)
Basophils Absolute: 0.1 10*3/uL (ref 0.0–0.1)
Basophils Relative: 1 %
Eosinophils Absolute: 0.1 10*3/uL (ref 0.0–0.5)
Eosinophils Relative: 1 %
HCT: 45.1 % (ref 36.0–46.0)
Hemoglobin: 14.6 g/dL (ref 12.0–15.0)
Immature Granulocytes: 0 %
Lymphocytes Relative: 28 %
Lymphs Abs: 2.1 10*3/uL (ref 0.7–4.0)
MCH: 31.5 pg (ref 26.0–34.0)
MCHC: 32.4 g/dL (ref 30.0–36.0)
MCV: 97.2 fL (ref 80.0–100.0)
Monocytes Absolute: 0.7 10*3/uL (ref 0.1–1.0)
Monocytes Relative: 9 %
Neutro Abs: 4.7 10*3/uL (ref 1.7–7.7)
Neutrophils Relative %: 61 %
Platelets: 195 10*3/uL (ref 150–400)
RBC: 4.64 MIL/uL (ref 3.87–5.11)
RDW: 13.3 % (ref 11.5–15.5)
WBC: 7.7 10*3/uL (ref 4.0–10.5)
nRBC: 0 % (ref 0.0–0.2)

## 2022-11-02 LAB — URINALYSIS, ROUTINE W REFLEX MICROSCOPIC
Bilirubin Urine: NEGATIVE
Glucose, UA: NEGATIVE mg/dL
Ketones, ur: NEGATIVE mg/dL
Nitrite: NEGATIVE
Protein, ur: 100 mg/dL — AB
Specific Gravity, Urine: 1.019 (ref 1.005–1.030)
WBC, UA: >50 WBC/hpf (ref 0–5)
pH: 5 (ref 5.0–8.0)

## 2022-11-02 LAB — TROPONIN I (HIGH SENSITIVITY)
Troponin I (High Sensitivity): 17 ng/L (ref <18)
Troponin I (High Sensitivity): 14 ng/L (ref <18)

## 2022-11-02 LAB — SARS CORONAVIRUS 2 BY RT PCR: SARS Coronavirus 2 by RT PCR: NEGATIVE

## 2022-11-02 LAB — LACTIC ACID, PLASMA
Lactic Acid, Venous: 1.1 mmol/L (ref 0.5–1.9)
Lactic Acid, Venous: 2 mmol/L (ref 0.5–1.9)

## 2022-11-02 LAB — GLUCOSE, CAPILLARY: Glucose-Capillary: 297 mg/dL — ABNORMAL HIGH (ref 70–99)

## 2022-11-02 LAB — BRAIN NATRIURETIC PEPTIDE: B Natriuretic Peptide: 359 pg/mL — ABNORMAL HIGH (ref 0.0–100.0)

## 2022-11-02 MED ORDER — PREDNISONE 20 MG PO TABS: 40.0000 mg | ORAL_TABLET | Freq: Every day | ORAL | Status: DC

## 2022-11-02 MED ORDER — IPRATROPIUM-ALBUTEROL 0.5-2.5 (3) MG/3ML IN SOLN
3.0000 mL | Freq: Once | RESPIRATORY_TRACT | Status: AC
  Administered 2022-11-02: 3 mL via RESPIRATORY_TRACT
  Filled 2022-11-02: qty 3

## 2022-11-02 MED ORDER — PANTOPRAZOLE SODIUM 40 MG PO TBEC: 40.0000 mg | DELAYED_RELEASE_TABLET | Freq: Every day | ORAL | Status: DC

## 2022-11-02 MED ORDER — INSULIN GLARGINE-YFGN 100 UNIT/ML ~~LOC~~ SOLN
15.0000 [IU] | Freq: Every day | SUBCUTANEOUS | Status: DC
  Administered 2022-11-02 – 2022-11-04 (×3): 15 [IU] via SUBCUTANEOUS
  Filled 2022-11-02 (×5): qty 0.15

## 2022-11-02 MED ORDER — ALBUTEROL SULFATE (2.5 MG/3ML) 0.083% IN NEBU
INHALATION_SOLUTION | RESPIRATORY_TRACT | Status: AC
  Filled 2022-11-02: qty 12

## 2022-11-02 MED ORDER — BENAZEPRIL HCL 20 MG PO TABS: 40.0000 mg | ORAL_TABLET | Freq: Every day | ORAL | Status: DC

## 2022-11-02 MED ORDER — SODIUM CHLORIDE 0.9 % IV SOLN
1.0000 g | Freq: Once | INTRAVENOUS | Status: AC
  Administered 2022-11-02: 1 g via INTRAVENOUS
  Filled 2022-11-02: qty 10

## 2022-11-02 MED ORDER — ROSUVASTATIN CALCIUM 20 MG PO TABS
40.0000 mg | ORAL_TABLET | Freq: Every day | ORAL | Status: DC
  Administered 2022-11-02 – 2022-11-05 (×4): 40 mg via ORAL
  Filled 2022-11-02 (×4): qty 2

## 2022-11-02 MED ORDER — ALBUTEROL SULFATE (2.5 MG/3ML) 0.083% IN NEBU: 2.5000 mg | INHALATION_SOLUTION | RESPIRATORY_TRACT | Status: DC | PRN

## 2022-11-02 MED ORDER — INSULIN ASPART 100 UNIT/ML IJ SOLN
0.0000 [IU] | Freq: Three times a day (TID) | INTRAMUSCULAR | Status: DC
  Administered 2022-11-03 (×2): 5 [IU] via SUBCUTANEOUS
  Administered 2022-11-03 – 2022-11-04 (×2): 3 [IU] via SUBCUTANEOUS
  Administered 2022-11-04: 8 [IU] via SUBCUTANEOUS
  Administered 2022-11-04: 5 [IU] via SUBCUTANEOUS
  Administered 2022-11-05: 3 [IU] via SUBCUTANEOUS
  Administered 2022-11-05: 5 [IU] via SUBCUTANEOUS

## 2022-11-02 MED ORDER — POTASSIUM CHLORIDE CRYS ER 20 MEQ PO TBCR
20.0000 meq | EXTENDED_RELEASE_TABLET | Freq: Two times a day (BID) | ORAL | Status: DC
  Administered 2022-11-02: 20 meq via ORAL
  Filled 2022-11-02: qty 1

## 2022-11-02 MED ORDER — ONDANSETRON HCL 4 MG/2ML IJ SOLN: 4.0000 mg | Freq: Four times a day (QID) | INTRAMUSCULAR | Status: DC | PRN

## 2022-11-02 MED ORDER — IPRATROPIUM-ALBUTEROL 0.5-2.5 (3) MG/3ML IN SOLN
3.0000 mL | Freq: Three times a day (TID) | RESPIRATORY_TRACT | Status: DC
  Administered 2022-11-03: 3 mL via RESPIRATORY_TRACT
  Filled 2022-11-02: qty 3

## 2022-11-02 MED ORDER — SODIUM CHLORIDE 0.9 % IV SOLN
1.0000 g | INTRAVENOUS | Status: DC
  Administered 2022-11-03 – 2022-11-04 (×2): 1 g via INTRAVENOUS
  Filled 2022-11-02 (×2): qty 10

## 2022-11-02 MED ORDER — FLUTICASONE PROPIONATE 50 MCG/ACT NA SUSP: 2.0000 | Freq: Every day | NASAL | Status: DC

## 2022-11-02 MED ORDER — GABAPENTIN 100 MG PO CAPS
100.0000 mg | ORAL_CAPSULE | Freq: Three times a day (TID) | ORAL | Status: DC
  Administered 2022-11-02: 100 mg via ORAL
  Filled 2022-11-02 (×2): qty 1

## 2022-11-02 MED ORDER — LORAZEPAM 0.5 MG PO TABS
0.5000 mg | ORAL_TABLET | Freq: Two times a day (BID) | ORAL | Status: DC | PRN
  Administered 2022-11-02 – 2022-11-04 (×3): 0.5 mg via ORAL
  Filled 2022-11-02 (×3): qty 1

## 2022-11-02 MED ORDER — HYDROXYZINE PAMOATE 25 MG PO CAPS: 25.0000 mg | ORAL_CAPSULE | Freq: Two times a day (BID) | ORAL | Status: DC | PRN

## 2022-11-02 MED ORDER — LORATADINE 10 MG PO TABS
10.0000 mg | ORAL_TABLET | Freq: Every evening | ORAL | Status: DC
  Administered 2022-11-03 – 2022-11-04 (×2): 10 mg via ORAL
  Filled 2022-11-02 (×2): qty 1

## 2022-11-02 MED ORDER — METHYLPREDNISOLONE SODIUM SUCC 125 MG IJ SOLR
125.0000 mg | Freq: Once | INTRAMUSCULAR | Status: AC
  Administered 2022-11-02: 125 mg via INTRAVENOUS
  Filled 2022-11-02: qty 2

## 2022-11-02 MED ORDER — GUAIFENESIN ER 600 MG PO TB12
600.0000 mg | ORAL_TABLET | Freq: Two times a day (BID) | ORAL | Status: DC
  Administered 2022-11-02 – 2022-11-05 (×6): 600 mg via ORAL
  Filled 2022-11-02 (×6): qty 1

## 2022-11-02 MED ORDER — FENOFIBRATE 160 MG PO TABS
160.0000 mg | ORAL_TABLET | Freq: Every day | ORAL | Status: DC
  Administered 2022-11-03 – 2022-11-05 (×3): 160 mg via ORAL
  Filled 2022-11-02 (×3): qty 1

## 2022-11-02 MED ORDER — ONDANSETRON HCL 4 MG PO TABS: 4.0000 mg | ORAL_TABLET | Freq: Four times a day (QID) | ORAL | Status: DC | PRN

## 2022-11-02 MED ORDER — ALBUTEROL SULFATE (2.5 MG/3ML) 0.083% IN NEBU
10.0000 mg/h | INHALATION_SOLUTION | RESPIRATORY_TRACT | Status: DC
  Administered 2022-11-02: 10 mg/h via RESPIRATORY_TRACT

## 2022-11-02 MED ORDER — IPRATROPIUM-ALBUTEROL 0.5-2.5 (3) MG/3ML IN SOLN
RESPIRATORY_TRACT | Status: AC
  Administered 2022-11-02: 3 mL
  Filled 2022-11-02: qty 3

## 2022-11-02 MED ORDER — AMLODIPINE BESYLATE 5 MG PO TABS
10.0000 mg | ORAL_TABLET | Freq: Every day | ORAL | Status: DC
  Administered 2022-11-03 – 2022-11-05 (×3): 10 mg via ORAL
  Filled 2022-11-02 (×3): qty 2

## 2022-11-02 MED ORDER — CHLORTHALIDONE 25 MG PO TABS: 12.5000 mg | ORAL_TABLET | Freq: Every day | ORAL | Status: DC

## 2022-11-02 MED ORDER — SODIUM CHLORIDE 0.9 % IV SOLN
500.0000 mg | INTRAVENOUS | Status: DC
  Administered 2022-11-02 – 2022-11-04 (×3): 500 mg via INTRAVENOUS
  Filled 2022-11-02 (×3): qty 5

## 2022-11-02 MED ORDER — INSULIN ASPART 100 UNIT/ML IJ SOLN
0.0000 [IU] | Freq: Every day | INTRAMUSCULAR | Status: DC
  Administered 2022-11-02: 3 [IU] via SUBCUTANEOUS

## 2022-11-02 MED ORDER — ENOXAPARIN SODIUM 40 MG/0.4ML IJ SOSY
40.0000 mg | PREFILLED_SYRINGE | INTRAMUSCULAR | Status: DC
  Administered 2022-11-02 – 2022-11-04 (×3): 40 mg via SUBCUTANEOUS
  Filled 2022-11-02 (×3): qty 0.4

## 2022-11-02 MED ORDER — MELATONIN 3 MG PO TABS
6.0000 mg | ORAL_TABLET | Freq: Every evening | ORAL | Status: DC | PRN
  Administered 2022-11-02 – 2022-11-04 (×3): 6 mg via ORAL
  Filled 2022-11-02 (×3): qty 2

## 2022-11-02 MED ORDER — FUROSEMIDE 10 MG/ML IJ SOLN: 40.0000 mg | Freq: Two times a day (BID) | INTRAMUSCULAR | Status: DC

## 2022-11-02 MED ORDER — METHYLPREDNISOLONE SODIUM SUCC 125 MG IJ SOLR
60.0000 mg | Freq: Two times a day (BID) | INTRAMUSCULAR | Status: DC
  Administered 2022-11-02: 60 mg via INTRAVENOUS
  Filled 2022-11-02: qty 2

## 2022-11-02 MED ORDER — FUROSEMIDE 10 MG/ML IJ SOLN
20.0000 mg | Freq: Once | INTRAMUSCULAR | Status: AC
  Administered 2022-11-02: 20 mg via INTRAVENOUS
  Filled 2022-11-02: qty 2

## 2022-11-02 MED ORDER — IPRATROPIUM-ALBUTEROL 0.5-2.5 (3) MG/3ML IN SOLN: 3.0000 mL | Freq: Four times a day (QID) | RESPIRATORY_TRACT | Status: DC

## 2022-11-02 NOTE — H&P (Signed)
History and Physical    Patient: Caitlin Mccarthy:[id number] DOB: 1935/10/15 DOA: 11/02/2022 DOS: the patient was seen and examined on 11/02/2022 PCP: Bennie Pierini, FNP  Patient coming from: Home  Chief Complaint:  Chief Complaint  Patient presents with   Cough   HPI: Caitlin Mccarthy is a 87 y.o. female with medical history significant of type 2 diabetes, hypertension, hyperlipidemia, history of stroke, GERD.  She is a smoker.  Over the past week, she has been having increased congestion which she attributed to her sinuses.  She has been in her bed pretty much the whole day and feeling weak and tired.  Her appetite has been diminished.  She did have a virtual visit with her provider who prescribed her amoxicillin.  She has taken 3 doses, but has not felt any better.  She has been feeling short of breath when she gets up and goes to the bathroom.  She is able to walk approximately 75 feet before she gets short of breath.  Denies fevers, chills, nausea, vomiting.  Denies chest pain and abdominal pain.  On arrival she was 84% on room air which quickly went to the low 90s on nasal cannula  Review of Systems: As mentioned in the history of present illness. All other systems reviewed and are negative. Past Medical History:  Diagnosis Date   Anxiety    Depression    GERD (gastroesophageal reflux disease)    History of acute pyelonephritis    last episode 04-13-2015  w/ sepsis   History of kidney stones    History of recurrent UTIs    MULTIPLE   Hyperlipidemia    Hypertension    OA (osteoarthritis)    knees, hips, hands   Osteoporosis    Poor memory    especially when has uti   Renal calculus, left    Stroke (HCC)    Type 2 diabetes mellitus (HCC)    Vitamin D deficiency 05/10/2016   Past Surgical History:  Procedure Laterality Date   CATARACT EXTRACTION W/PHACO  06/06/2011   Procedure: CATARACT EXTRACTION PHACO AND INTRAOCULAR LENS PLACEMENT (IOC);  Surgeon: Gemma Payor;   Location: [facility];  Service: Ophthalmology;  Laterality: Right;  CDE=12.77   CATARACT EXTRACTION W/PHACO  06/27/2011   Procedure: CATARACT EXTRACTION PHACO AND INTRAOCULAR LENS PLACEMENT (IOC);  Surgeon: Gemma Payor;  Location: [facility];  Service: Ophthalmology;  Laterality: Left;  CDE:13.96   CYSTOSCOPY WITH URETEROSCOPY AND STENT PLACEMENT Right 04/27/2015   Procedure: CYSTOSCOPY WITH RIGHT URETEROSCOPY, BASKET REMOVAL OF STONE, REMOVAL OF RIGHT NEPHROSTOMY TUBE;  Surgeon: Bjorn Pippin, MD;  Location: [facility];  Service: Urology;  Laterality: Right;   CYSTOSCOPY/URETEROSCOPY/HOLMIUM LASER/STENT PLACEMENT Left 12/24/2016   Procedure: LEFT URETEROSCOPY WITH HOLMIUM LASER AND STENT PLACEMENT;  Surgeon: Bjorn Pippin, MD;  Location: [facility];  Service: Urology;  Laterality: Left;   EXTRACORPOREAL SHOCK WAVE LITHOTRIPSY  left 04-01-2016;  1980's   FRACTURE SURGERY     HIP ARTHROPLASTY     HOLMIUM LASER APPLICATION Right 04/27/2015   Procedure: HOLMIUM LASER APPLICATION;  Surgeon: Bjorn Pippin, MD;  Location: [facility];  Service: Urology;  Laterality: Right;   KNEE ARTHROPLASTY     KNEE ARTHROSCOPY Bilateral right 2005//  left ?   LOOP RECORDER INSERTION N/A 04/01/2017   Procedure: LOOP RECORDER INSERTION;  Surgeon: Hillis Range, MD;  Location: [facility];  Service: Cardiovascular;  Laterality: N/A;   TOTAL HIP ARTHROPLASTY  10/28/2011  Procedure: TOTAL HIP ARTHROPLASTY;  Surgeon: Loanne Drilling, MD;  Location: [facility];  Service: Orthopedics;  Laterality: Right;   TOTAL KNEE ARTHROPLASTY Bilateral left 03-09-2007//  right 2006   TRANSTHORACIC ECHOCARDIOGRAM  10/06/2006   normal echo,  ef 55-60%   TUBAL LIGATION     Social History:  reports that she has been smoking cigarettes. She has a 10.50 pack-year smoking history. She has never used smokeless tobacco. She reports that she does not drink alcohol and does not use drugs.  Allergies  Allergen  Reactions   Iodinated Contrast Media    Latex     Family History  Problem Relation Age of Onset   Kidney disease Mother    Diabetes Mother    Congestive Heart Failure Father    Heart failure Father    Heart disease Brother    Alcohol abuse Brother    Anesthesia problems Neg Hx    Hypotension Neg Hx    Malignant hyperthermia Neg Hx    Pseudochol deficiency Neg Hx     Prior to Admission medications   Medication Sig Start Date End Date Taking? Authorizing Provider  Alcohol Swabs (B-D SINGLE USE SWABS REGULAR) PADS Test BS daily and as needed Dx E11.65 01/05/20   Delynn Flavin M, DO  amLODipine-benazepril (LOTREL) 10-40 MG capsule Take 1 capsule by mouth daily. 08/30/22   Daphine Deutscher, Mary-Margaret, FNP  amoxicillin (AMOXIL) 875 MG tablet Take 1 tablet (875 mg total) by mouth 2 (two) times daily for 7 days. 10/31/22 11/07/22  Gabriel Earing, FNP  aspirin EC 81 MG tablet Take 1 tablet (81 mg total) by mouth daily. 04/29/18   Laqueta Linden, MD  Baclofen 5 MG TABS Take 5 mg by mouth 3 (three) times daily as needed. 10/25/19   Bennie Pierini, FNP  Blood Glucose Calibration (TRUE METRIX LEVEL 1) Low SOLN Use with glucose monitor Dx E11.65 01/05/20   Delynn Flavin M, DO  Blood Glucose Monitoring Suppl (TRUE METRIX AIR GLUCOSE METER) w/Device KIT Test BS daily and as needed Dx E11.65 01/05/20   Delynn Flavin M, DO  chlorthalidone (HYGROTON) 25 MG tablet Take 0.5 tablets (12.5 mg total) by mouth daily. 08/30/22   Daphine Deutscher Mary-Margaret, FNP  cholecalciferol (VITAMIN D3) 25 MCG (1000 UNIT) tablet Take 1,000 Units by mouth daily.    [provider]  fenofibrate (TRICOR) 145 MG tablet Take 1 tablet (145 mg total) by mouth daily. 08/30/22   Daphine Deutscher, Mary-Margaret, FNP  fluticasone (FLONASE) 50 MCG/ACT nasal spray Place 2 sprays into both nostrils daily. 07/31/18   Junie Spencer, FNP  gabapentin (NEURONTIN) 100 MG capsule Take 1 capsule (100 mg total) by mouth 3 (three) times daily.  08/30/22   Daphine Deutscher Mary-Margaret, FNP  glucose blood (TRUE METRIX BLOOD GLUCOSE TEST) test strip Test BS daily and as needed Dx E11.65 01/05/20   Raliegh Ip, DO  hydrOXYzine (VISTARIL) 25 MG capsule Take 1 capsule (25 mg total) by mouth 2 (two) times daily as needed. 08/30/22   Daphine Deutscher Mary-Margaret, FNP  levocetirizine (XYZAL) 5 MG tablet Take 1 tablet (5 mg total) by mouth every evening. 10/31/22   Gabriel Earing, FNP  LORazepam (ATIVAN) 0.5 MG tablet Take 1 tablet (0.5 mg total) by mouth 2 (two) times daily as needed. for anxiety 08/30/22   Bennie Pierini, FNP  metFORMIN (GLUCOPHAGE-XR) 500 MG 24 hr tablet Take 1 tablet (500 mg total) by mouth in the morning and at bedtime. 08/30/22   Bennie Pierini, FNP  methocarbamol (ROBAXIN) 500 MG tablet  12/06/20   [provider]  nystatin (MYCOSTATIN/NYSTOP) powder APPLY TOPICALLY FOUR TIMES DAILY 05/13/22   Bennie Pierini, FNP  nystatin (MYCOSTATIN/NYSTOP) powder APPLY TWICE DAILY 05/13/22   Daphine Deutscher, Mary-Margaret, FNP  omeprazole (PRILOSEC) 40 MG capsule Take 1 capsule (40 mg total) by mouth daily. 08/30/22   Daphine Deutscher, Mary-Margaret, FNP  ondansetron (ZOFRAN) 4 MG tablet Take 1 tablet (4 mg total) by mouth every 8 (eight) hours as needed for nausea or vomiting. 02/26/22   Daphine Deutscher, Mary-Margaret, FNP  ondansetron (ZOFRAN-ODT) 4 MG disintegrating tablet TAKE 1 TABLET EVERY 8 HOURS AS NEEDED FOR NAUSEA AND VOMITING 08/13/21   Daphine Deutscher, Mary-Margaret, FNP  polyethylene glycol (MIRALAX / GLYCOLAX) 17 g packet Take 17 g by mouth daily.    [provider]  PREMARIN vaginal cream Apply 1 application topically daily as needed (irritation).  12/06/16   [provider]  rosuvastatin (CRESTOR) 40 MG tablet TAKE ONE (1) TABLET EACH DAY 08/30/22   Daphine Deutscher, Mary-Margaret, FNP  traMADol (ULTRAM) 50 MG tablet Take 2 tablets (100 mg total) by mouth 2 (two) times daily. TAKE ONE TABLET FOUR TIMES DAILY 08/30/22   Bennie Pierini, FNP   TRUEplus Lancets 33G MISC Test BS daily and as needed Dx E11.65 01/05/20   Raliegh Ip, DO    Physical Exam: Vitals:   11/02/22 1429 11/02/22 1434  BP:  (!) 142/71  Pulse:  84  Resp:  18  Temp:  98.3 F (36.8 C)  TempSrc:  Oral  SpO2: (!) 84% 93%  Weight: 90.7 kg   Height: 5\' 10"  (1.778 m)    General: Elderly female. Awake and alert and oriented x3. No acute cardiopulmonary distress.  HEENT: Normocephalic atraumatic.  Right and left ears normal in appearance.  Pupils equal, round, reactive to light. Extraocular muscles are intact. Sclerae anicteric and noninjected.  Moist mucosal membranes. No mucosal lesions.  Neck: Neck supple without lymphadenopathy. No carotid bruits. No masses palpated.  Cardiovascular: Regular rate with normal S1-S2 sounds. No murmurs, rubs, gallops auscultated. Increased JVD.  Respiratory: Mild inspiratory and expiratory wheezing.  Rales in bases.  No accessory muscle use. Abdomen: Soft, nontender, nondistended. Active bowel sounds. No masses or hepatosplenomegaly  Skin: No rashes, lesions, or ulcerations.  Dry, warm to touch. 2+ dorsalis pedis and radial pulses. Musculoskeletal: No calf or leg pain. All major joints not erythematous nontender.  No upper or lower joint deformation.  Good ROM.  No contractures  Psychiatric: Intact judgment and insight. Pleasant and cooperative. Neurologic: No focal neurological deficits. Strength is 5/5 and symmetric in upper and lower extremities.  Cranial nerves II through XII are grossly intact.  Data Reviewed: Results for orders placed or performed during the hospital encounter of 11/02/22 (from the past 24 hour(s))  Lactic acid, plasma     Status: None   Collection Time: 11/02/22  2:36 PM  Result Value Ref Range   Lactic Acid, Venous 1.1 0.5 - 1.9 mmol/L  Comprehensive metabolic panel     Status: Abnormal   Collection Time: 11/02/22  2:36 PM  Result Value Ref Range   Sodium 133 (L) 135 - 145 mmol/L   Potassium  4.1 3.5 - 5.1 mmol/L   Chloride 97 (L) 98 - 111 mmol/L   CO2 28 22 - 32 mmol/L   Glucose, Bld 125 (H) 70 - 99 mg/dL   BUN 21 8 - 23 mg/dL   Creatinine, Ser 1.61 (H) 0.44 - 1.00 mg/dL   Calcium 09.6 (  H) 8.9 - 10.3 mg/dL   Total Protein 7.8 6.5 - 8.1 g/dL   Albumin 4.0 3.5 - 5.0 g/dL   AST 26 15 - 41 U/L   ALT 19 0 - 44 U/L   Alkaline Phosphatase 45 38 - 126 U/L   Total Bilirubin 0.6 0.3 - 1.2 mg/dL   GFR, Estimated 55 (L) >60 mL/min   Anion gap 8 5 - 15  CBC with Differential     Status: None   Collection Time: 11/02/22  2:36 PM  Result Value Ref Range   WBC 7.7 4.0 - 10.5 K/uL   RBC 4.64 3.87 - 5.11 MIL/uL   Hemoglobin 14.6 12.0 - 15.0 g/dL   HCT 32.4 40.1 - 02.7 %   MCV 97.2 80.0 - 100.0 fL   MCH 31.5 26.0 - 34.0 pg   MCHC 32.4 30.0 - 36.0 g/dL   RDW 25.3 66.4 - 40.3 %   Platelets 195 150 - 400 K/uL   nRBC 0.0 0.0 - 0.2 %   Neutrophils Relative % 61 %   Neutro Abs 4.7 1.7 - 7.7 K/uL   Lymphocytes Relative 28 %   Lymphs Abs 2.1 0.7 - 4.0 K/uL   Monocytes Relative 9 %   Monocytes Absolute 0.7 0.1 - 1.0 K/uL   Eosinophils Relative 1 %   Eosinophils Absolute 0.1 0.0 - 0.5 K/uL   Basophils Relative 1 %   Basophils Absolute 0.1 0.0 - 0.1 K/uL   Immature Granulocytes 0 %   Abs Immature Granulocytes 0.02 0.00 - 0.07 K/uL  Troponin I (High Sensitivity)     Status: None   Collection Time: 11/02/22  2:36 PM  Result Value Ref Range   Troponin I (High Sensitivity) 17 <18 ng/L  Brain natriuretic peptide     Status: Abnormal   Collection Time: 11/02/22  3:30 PM  Result Value Ref Range   B Natriuretic Peptide 359.0 (H) 0.0 - 100.0 pg/mL  Lactic acid, plasma     Status: Abnormal   Collection Time: 11/02/22  4:36 PM  Result Value Ref Range   Lactic Acid, Venous 2.0 (HH) 0.5 - 1.9 mmol/L  Troponin I (High Sensitivity)     Status: None   Collection Time: 11/02/22  4:36 PM  Result Value Ref Range   Troponin I (High Sensitivity) 14 <18 ng/L    DG Chest Port 1 View  Result Date:  11/02/2022 CLINICAL DATA:  Shortness of breath. EXAM: PORTABLE CHEST 1 VIEW COMPARISON:  09/21/2019. FINDINGS: Implantable loop recorder projects over the left heart border. Mild pulmonary edema. No pleural effusion or pneumothorax. New mild left atrial enlargement. Unchanged atherosclerotic calcifications of the aortic arch. Visualized bones and upper abdomen are unremarkable. IMPRESSION: 1. Mild pulmonary edema with new mild left atrial enlargement. 2.  Aortic Atherosclerosis (ICD10-I70.0). Electronically Signed   By: Orvan Falconer M.D.   On: 11/02/2022 15:02     Assessment and Plan: No notes have been filed under this hospital service. Service: Hospitalist  Principal Problem:   Acute on chronic respiratory failure with hypoxia (HCC) Active Problems:   Essential hypertension, benign   GERD (gastroesophageal reflux disease)   Chronic pain   Type 2 diabetes mellitus (HCC)   COPD (chronic obstructive pulmonary disease) (HCC)   Hyperlipidemia due to type 2 diabetes mellitus (HCC)   Acute clinical systolic heart failure (HCC)   COPD with acute exacerbation (HCC)  Acute on chronic respiratory failure with hypoxia O2 support COPD with acute exacerbation Antibiotics: rocephin DuoNeb's  every 6 scheduled with albuterol every 2 when necessary Continue inhaled steroids and LA bronchodilator Solu-Medrol 60 mg IV every 12 hours Mucinex Acute clinical systolic heart failure Telemetry monitoring Strict I/O Daily Weights Diuresis: lasix Potassium: 20 mEq twice a day by mouth Echo cardiac exam tomorrow Repeat BMP tomorrow Type 2 diabetes Lantus and SSI Hypertension antihypertensives Hyperlipidemia   Advance Care Planning:   Code Status: DNR confirmed by patient  Consults: none  Family Communication: daughter present, who aids in history of patient  Severity of Illness: The appropriate patient status for this patient is INPATIENT. Inpatient status is judged to be reasonable and  necessary in order to provide the required intensity of service to ensure the patient's safety. The patient's presenting symptoms, physical exam findings, and initial radiographic and laboratory data in the context of their chronic comorbidities is felt to place them at high risk for further clinical deterioration. Furthermore, it is not anticipated that the patient will be medically stable for discharge from the hospital within 2 midnights of admission.   * I certify that at the point of admission it is my clinical judgment that the patient will require inpatient hospital care spanning beyond 2 midnights from the point of admission due to high intensity of service, high risk for further deterioration and high frequency of surveillance required.*  Author: Levie Heritage, DO 11/02/2022 6:33 PM  For on call review [email or web address].

## 2022-11-02 NOTE — ED Triage Notes (Addendum)
Pt presents with a 1 week hx of nasal congestion and drainage that moved into her chest on Wednesday. Pt reports non-productive cough, chest tightness, ShOB. Pt PCP called in a Rx for amoxicillin. SpO2 84% at rest in triage. Pt placed on 2L O2 via N/C and SpO2 came up to 93%

## 2022-11-02 NOTE — ED Provider Notes (Signed)
[place] EMERGENCY DEPARTMENT AT [facility] Provider Note   CSN: [id number] Arrival date & time: 11/02/22  1419     History  Chief Complaint  Patient presents with   Cough    Caitlin Mccarthy is a 87 y.o. female.  Has PMH of diabetes CVA.  Presents the ER today complaining of shortness of breath.  She been having several weeks of runny nose, sneezing and developed cough for the past couple of days.  She thought it was due to allergies, did not improve with allergy medications.  She virtual primary care visit a couple of days ago and was prescribed amoxicillin for presumed sinus infection.  She is more short of breath today so comes to ED for evaluation noted to have low oxygen.    She does not use oxygen.  She does smoke cigarettes daily, cough is nonproductive, no fevers, no chest pain.  No history of CHF, she is never had a prior episode.   Cough      Home Medications Prior to Admission medications   Medication Sig Start Date End Date Taking? Authorizing Provider  Alcohol Swabs (B-D SINGLE USE SWABS REGULAR) PADS Test BS daily and as needed Dx E11.65 01/05/20   Delynn Flavin M, DO  amLODipine-benazepril (LOTREL) 10-40 MG capsule Take 1 capsule by mouth daily. 08/30/22   Daphine Deutscher, Mary-Margaret, FNP  amoxicillin (AMOXIL) 875 MG tablet Take 1 tablet (875 mg total) by mouth 2 (two) times daily for 7 days. 10/31/22 11/07/22  Gabriel Earing, FNP  aspirin EC 81 MG tablet Take 1 tablet (81 mg total) by mouth daily. 04/29/18   Laqueta Linden, MD  Baclofen 5 MG TABS Take 5 mg by mouth 3 (three) times daily as needed. 10/25/19   Bennie Pierini, FNP  Blood Glucose Calibration (TRUE METRIX LEVEL 1) Low SOLN Use with glucose monitor Dx E11.65 01/05/20   Delynn Flavin M, DO  Blood Glucose Monitoring Suppl (TRUE METRIX AIR GLUCOSE METER) w/Device KIT Test BS daily and as needed Dx E11.65 01/05/20   Delynn Flavin M, DO  chlorthalidone (HYGROTON) 25 MG tablet Take 0.5  tablets (12.5 mg total) by mouth daily. 08/30/22   Daphine Deutscher Mary-Margaret, FNP  cholecalciferol (VITAMIN D3) 25 MCG (1000 UNIT) tablet Take 1,000 Units by mouth daily.    [provider]  fenofibrate (TRICOR) 145 MG tablet Take 1 tablet (145 mg total) by mouth daily. 08/30/22   Daphine Deutscher, Mary-Margaret, FNP  fluticasone (FLONASE) 50 MCG/ACT nasal spray Place 2 sprays into both nostrils daily. 07/31/18   Junie Spencer, FNP  gabapentin (NEURONTIN) 100 MG capsule Take 1 capsule (100 mg total) by mouth 3 (three) times daily. 08/30/22   Daphine Deutscher Mary-Margaret, FNP  glucose blood (TRUE METRIX BLOOD GLUCOSE TEST) test strip Test BS daily and as needed Dx E11.65 01/05/20   Raliegh Ip, DO  hydrOXYzine (VISTARIL) 25 MG capsule Take 1 capsule (25 mg total) by mouth 2 (two) times daily as needed. 08/30/22   Daphine Deutscher Mary-Margaret, FNP  levocetirizine (XYZAL) 5 MG tablet Take 1 tablet (5 mg total) by mouth every evening. 10/31/22   Gabriel Earing, FNP  LORazepam (ATIVAN) 0.5 MG tablet Take 1 tablet (0.5 mg total) by mouth 2 (two) times daily as needed. for anxiety 08/30/22   Bennie Pierini, FNP  metFORMIN (GLUCOPHAGE-XR) 500 MG 24 hr tablet Take 1 tablet (500 mg total) by mouth in the morning and at bedtime. 08/30/22   Daphine Deutscher Mary-Margaret, FNP  methocarbamol (ROBAXIN) 500  MG tablet  12/06/20   [provider]  nystatin (MYCOSTATIN/NYSTOP) powder APPLY TOPICALLY FOUR TIMES DAILY 05/13/22   Bennie Pierini, FNP  nystatin (MYCOSTATIN/NYSTOP) powder APPLY TWICE DAILY 05/13/22   Daphine Deutscher, Mary-Margaret, FNP  omeprazole (PRILOSEC) 40 MG capsule Take 1 capsule (40 mg total) by mouth daily. 08/30/22   Daphine Deutscher, Mary-Margaret, FNP  ondansetron (ZOFRAN) 4 MG tablet Take 1 tablet (4 mg total) by mouth every 8 (eight) hours as needed for nausea or vomiting. 02/26/22   Daphine Deutscher, Mary-Margaret, FNP  ondansetron (ZOFRAN-ODT) 4 MG disintegrating tablet TAKE 1 TABLET EVERY 8 HOURS AS NEEDED FOR NAUSEA AND VOMITING  08/13/21   Daphine Deutscher, Mary-Margaret, FNP  polyethylene glycol (MIRALAX / GLYCOLAX) 17 g packet Take 17 g by mouth daily.    [provider]  PREMARIN vaginal cream Apply 1 application topically daily as needed (irritation).  12/06/16   [provider]  rosuvastatin (CRESTOR) 40 MG tablet TAKE ONE (1) TABLET EACH DAY 08/30/22   Daphine Deutscher, Mary-Margaret, FNP  traMADol (ULTRAM) 50 MG tablet Take 2 tablets (100 mg total) by mouth 2 (two) times daily. TAKE ONE TABLET FOUR TIMES DAILY 08/30/22   Bennie Pierini, FNP  TRUEplus Lancets 33G MISC Test BS daily and as needed Dx E11.65 01/05/20   Raliegh Ip, DO      Allergies    Iodinated contrast media and Latex    Review of Systems   Review of Systems  Respiratory:  Positive for cough.     Physical Exam Updated Vital Signs BP (!) 142/71 (BP Location: Right Arm)   Pulse 84   Temp 98.3 F (36.8 C) (Oral)   Resp 18   Ht 5\' 10"  (1.778 m)   Wt 90.7 kg   SpO2 93%   BMI 28.70 kg/m  Physical Exam Vitals and nursing note reviewed.  Constitutional:      General: She is not in acute distress.    Appearance: She is well-developed.  HENT:     Head: Normocephalic and atraumatic.     Mouth/Throat:     Mouth: Mucous membranes are moist.  Eyes:     Conjunctiva/sclera: Conjunctivae normal.  Cardiovascular:     Rate and Rhythm: Normal rate and regular rhythm.     Heart sounds: No murmur heard. Pulmonary:     Effort: Pulmonary effort is normal. No respiratory distress.     Breath sounds: Wheezing and rhonchi present.  Abdominal:     Palpations: Abdomen is soft.     Tenderness: There is no abdominal tenderness.  Musculoskeletal:        General: No swelling.     Cervical back: Neck supple.     Right lower leg: No edema.     Left lower leg: No edema.  Skin:    General: Skin is warm and dry.     Capillary Refill: Capillary refill takes less than 2 seconds.  Neurological:     General: No focal deficit present.     Mental  Status: She is alert and oriented to person, place, and time.  Psychiatric:        Mood and Affect: Mood normal.     ED Results / Procedures / Treatments   Labs (all labs ordered are listed, but only abnormal results are displayed) Labs Reviewed  LACTIC ACID, PLASMA - Abnormal; Notable for the following components:      Result Value   Lactic Acid, Venous 2.0 (*)    All other components within normal limits  COMPREHENSIVE  METABOLIC PANEL - Abnormal; Notable for the following components:   Sodium 133 (*)    Chloride 97 (*)    Glucose, Bld 125 (*)    Creatinine, Ser 1.01 (*)    Calcium 10.7 (*)    GFR, Estimated 55 (*)    All other components within normal limits  BRAIN NATRIURETIC PEPTIDE - Abnormal; Notable for the following components:   B Natriuretic Peptide 359.0 (*)    All other components within normal limits  SARS CORONAVIRUS 2 BY RT PCR  LACTIC ACID, PLASMA  CBC WITH DIFFERENTIAL/PLATELET  URINALYSIS, ROUTINE W REFLEX MICROSCOPIC  TROPONIN I (HIGH SENSITIVITY)  TROPONIN I (HIGH SENSITIVITY)    EKG EKG Interpretation  Date/Time:  Saturday Nov 02 2022 14:37:03 EDT Ventricular Rate:  84 PR Interval:  164 QRS Duration: 76 QT Interval:  348 QTC Calculation: 411 R Axis:   91 Text Interpretation: Sinus rhythm with occasional Premature ventricular complexes and Premature atrial complexes Rightward axis Nonspecific ST and T wave abnormality Confirmed by Cathren Laine ([id number]) on 11/02/2022 2:41:08 PM  Radiology DG Chest Port 1 View  Result Date: 11/02/2022 CLINICAL DATA:  Shortness of breath. EXAM: PORTABLE CHEST 1 VIEW COMPARISON:  09/21/2019. FINDINGS: Implantable loop recorder projects over the left heart border. Mild pulmonary edema. No pleural effusion or pneumothorax. New mild left atrial enlargement. Unchanged atherosclerotic calcifications of the aortic arch. Visualized bones and upper abdomen are unremarkable. IMPRESSION: 1. Mild pulmonary edema with new mild left  atrial enlargement. 2.  Aortic Atherosclerosis (ICD10-I70.0). Electronically Signed   By: Orvan Falconer M.D.   On: 11/02/2022 15:02    Procedures .Critical Care  Performed by: Ma Rings, PA-C Authorized by: Ma Rings, PA-C   Critical care provider statement:    Critical care time (minutes):  30   Critical care was necessary to treat or prevent imminent or life-threatening deterioration of the following conditions:  Respiratory failure   Critical care was time spent personally by me on the following activities:  Development of treatment plan with patient or surrogate, discussions with consultants, evaluation of patient's response to treatment, examination of patient, ordering and review of laboratory studies, ordering and review of radiographic studies, ordering and performing treatments and interventions, pulse oximetry, re-evaluation of patient's condition, review of old charts and obtaining history from patient or surrogate   Care discussed with: admitting provider       Medications Ordered in ED Medications  albuterol (PROVENTIL) (2.5 MG/3ML) 0.083% nebulizer solution (10 mg/hr Nebulization New Bag/Given 11/02/22 1647)  albuterol (PROVENTIL) (2.5 MG/3ML) 0.083% nebulizer solution (has no administration in time range)  azithromycin (ZITHROMAX) 500 mg in sodium chloride 0.9 % 250 mL IVPB (has no administration in time range)  cefTRIAXone (ROCEPHIN) 1 g in sodium chloride 0.9 % 100 mL IVPB (has no administration in time range)  ipratropium-albuterol (DUONEB) 0.5-2.5 (3) MG/3ML nebulizer solution 3 mL (3 mLs Nebulization Given 11/02/22 1523)  ipratropium-albuterol (DUONEB) 0.5-2.5 (3) MG/3ML nebulizer solution 3 mL (3 mLs Nebulization Given 11/02/22 1519)  ipratropium-albuterol (DUONEB) 0.5-2.5 (3) MG/3ML nebulizer solution 3 mL (3 mLs Nebulization Given 11/02/22 1528)  methylPREDNISolone sodium succinate (SOLU-MEDROL) 125 mg/2 mL injection 125 mg (125 mg Intravenous Given 11/02/22  1514)  furosemide (LASIX) injection 20 mg (20 mg Intravenous Given 11/02/22 1722)    ED Course/ Medical Decision Making/ A&P  Medical Decision Making This patient presents to the ED for concern of cough, wheezing, shortness of breath, this involves an extensive number of treatment options, and is a complaint that carries with it a high risk of complications and morbidity.  The differential diagnosis includes pneumonia, pulmonary edema, COPD exacerbation, other   Co morbidities that complicate the patient evaluation  Diabetes, stroke   Additional history obtained:  Additional history obtained from EMR External records from outside source obtained and reviewed including outpatient notes   Lab Tests:  I Ordered, and personally interpreted labs.  The pertinent results include: CBC is normal, CMP shows mild hyponatremia COVID-19 positive test (U07.1, COVID-19) with Acute Pneumonia (J12.89, Other viral pneumonia) (If respiratory failure or sepsis present, add as separate assessment)  BNP is somewhat elevated at 59, delta troponin is negative   Imaging Studies ordered:  I ordered imaging studies including chest x-ray I independently visualized and interpreted imaging which showed mild pulmonary edema I agree with the radiologist interpretation   Cardiac Monitoring: / EKG:  The patient was maintained on a cardiac monitor.  I personally viewed and interpreted the cardiac monitored which showed an underlying rhythm of: Sinus rhythm   Consultations Obtained:  I requested consultation with the close, Dr. Adrian Blackwater,  and discussed lab and imaging findings as well as pertinent plan - they recommend: Admit   Problem List / ED Course / Critical interventions / Medication management  Comes in with diffuse wheezing and hypoxia.  She never has the past she does smoke, question possible COPD.  Initially did not admit to fevers or productive cough so this less  likely pneumonia but when discussing admission with the patient and her daughter daughter states she has been "in the bed" for 3 days of having subjective fevers so we will add on antibiotics as well for possible pneumonia.  She has been amoxicillin for several days. Patient has no history of CHF but did have some mild pulmonary edema and slightly elevated BNP.  Given 20 of Lasix to see if this helps as well.  On reevaluation patient was feeling much better after the nebulizer treatments but when I titrate her down to 1 L from 3 she dropped to 92 to 93%.  Also patient she needs admission for hypoxic respiratory failure  Her URI symptoms have been ongoing for weeks so doubt active COVID-19 infection but swab will be sent for this due to to admission I ordered medication including bronchodilators including DuoNebs and continuous albuterol treatment for wheezing Reevaluation of the patient after these medicines showed that the patient improved I have reviewed the patients home medicines and have made adjustments as needed       Amount and/or Complexity of Data Reviewed Labs: ordered. Radiology: ordered.  Risk Prescription drug management.           Final Clinical Impression(s) / ED Diagnoses Final diagnoses:  None    Rx / DC Orders ED Discharge Orders     None         Ma Rings, PA-C 11/02/22 1747    Lonell Grandchild, MD 11/02/22 1956

## 2022-11-03 ENCOUNTER — Inpatient Hospital Stay (HOSPITAL_COMMUNITY): Payer: Medicare HMO

## 2022-11-03 DIAGNOSIS — J9621 Acute and chronic respiratory failure with hypoxia: Secondary | ICD-10-CM | POA: Diagnosis not present

## 2022-11-03 DIAGNOSIS — E1169 Type 2 diabetes mellitus with other specified complication: Secondary | ICD-10-CM | POA: Diagnosis not present

## 2022-11-03 DIAGNOSIS — E785 Hyperlipidemia, unspecified: Secondary | ICD-10-CM

## 2022-11-03 DIAGNOSIS — N179 Acute kidney failure, unspecified: Secondary | ICD-10-CM | POA: Insufficient documentation

## 2022-11-03 DIAGNOSIS — J441 Chronic obstructive pulmonary disease with (acute) exacerbation: Secondary | ICD-10-CM

## 2022-11-03 DIAGNOSIS — J9601 Acute respiratory failure with hypoxia: Secondary | ICD-10-CM

## 2022-11-03 DIAGNOSIS — N1831 Chronic kidney disease, stage 3a: Secondary | ICD-10-CM

## 2022-11-03 DIAGNOSIS — I5021 Acute systolic (congestive) heart failure: Secondary | ICD-10-CM | POA: Diagnosis not present

## 2022-11-03 LAB — RESPIRATORY PANEL BY PCR

## 2022-11-03 LAB — GLUCOSE, CAPILLARY
Glucose-Capillary: 211 mg/dL — ABNORMAL HIGH (ref 70–99)
Glucose-Capillary: 200 mg/dL — ABNORMAL HIGH (ref 70–99)
Glucose-Capillary: 229 mg/dL — ABNORMAL HIGH (ref 70–99)
Glucose-Capillary: 129 mg/dL — ABNORMAL HIGH (ref 70–99)

## 2022-11-03 LAB — HEMOGLOBIN A1C
Hgb A1c MFr Bld: 6.7 % — ABNORMAL HIGH (ref 4.8–5.6)
Mean Plasma Glucose: 145.59 mg/dL

## 2022-11-03 LAB — ECHOCARDIOGRAM COMPLETE
Est EF: >75
Height: 70 in
S' Lateral: 2.7 cm
Weight: 3171.1 oz

## 2022-11-03 LAB — BASIC METABOLIC PANEL
Anion gap: 10 (ref 5–15)
BUN: 31 mg/dL — ABNORMAL HIGH (ref 8–23)
CO2: 27 mmol/L (ref 22–32)
Calcium: 10.3 mg/dL (ref 8.9–10.3)
Chloride: 97 mmol/L — ABNORMAL LOW (ref 98–111)
Creatinine, Ser: 1.67 mg/dL — ABNORMAL HIGH (ref 0.44–1.00)
GFR, Estimated: 30 mL/min — ABNORMAL LOW (ref >60)
Glucose, Bld: 216 mg/dL — ABNORMAL HIGH (ref 70–99)
Potassium: 4.1 mmol/L (ref 3.5–5.1)
Sodium: 134 mmol/L — ABNORMAL LOW (ref 135–145)

## 2022-11-03 LAB — CBC
HCT: 39.9 % (ref 36.0–46.0)
Hemoglobin: 12.5 g/dL (ref 12.0–15.0)
MCH: 31.2 pg (ref 26.0–34.0)
MCHC: 31.3 g/dL (ref 30.0–36.0)
MCV: 99.5 fL (ref 80.0–100.0)
Platelets: 172 10*3/uL (ref 150–400)
RBC: 4.01 MIL/uL (ref 3.87–5.11)
RDW: 13.2 % (ref 11.5–15.5)
WBC: 5.9 10*3/uL (ref 4.0–10.5)
nRBC: 0 % (ref 0.0–0.2)

## 2022-11-03 LAB — VITAMIN D 25 HYDROXY (VIT D DEFICIENCY, FRACTURES): Vit D, 25-Hydroxy: 16.53 ng/mL — ABNORMAL LOW (ref 30–100)

## 2022-11-03 LAB — PROCALCITONIN: Procalcitonin: <0.1 ng/mL

## 2022-11-03 LAB — T4, FREE: Free T4: 0.74 ng/dL (ref 0.61–1.12)

## 2022-11-03 LAB — TSH: TSH: 0.51 u[IU]/mL (ref 0.350–4.500)

## 2022-11-03 MED ORDER — ARFORMOTEROL TARTRATE 15 MCG/2ML IN NEBU
15.0000 ug | INHALATION_SOLUTION | Freq: Two times a day (BID) | RESPIRATORY_TRACT | Status: DC
  Administered 2022-11-03 – 2022-11-05 (×5): 15 ug via RESPIRATORY_TRACT
  Filled 2022-11-03 (×5): qty 2

## 2022-11-03 MED ORDER — IPRATROPIUM-ALBUTEROL 0.5-2.5 (3) MG/3ML IN SOLN
3.0000 mL | Freq: Four times a day (QID) | RESPIRATORY_TRACT | Status: DC
  Administered 2022-11-03 – 2022-11-04 (×5): 3 mL via RESPIRATORY_TRACT
  Filled 2022-11-03 (×5): qty 3

## 2022-11-03 MED ORDER — BUDESONIDE 0.5 MG/2ML IN SUSP
0.5000 mg | Freq: Two times a day (BID) | RESPIRATORY_TRACT | Status: DC
  Administered 2022-11-03 – 2022-11-05 (×5): 0.5 mg via RESPIRATORY_TRACT
  Filled 2022-11-03 (×5): qty 2

## 2022-11-03 MED ORDER — ASPIRIN 81 MG PO CHEW
81.0000 mg | CHEWABLE_TABLET | Freq: Every day | ORAL | Status: DC
  Administered 2022-11-03 – 2022-11-05 (×3): 81 mg via ORAL
  Filled 2022-11-03 (×3): qty 1

## 2022-11-03 MED ORDER — METHYLPREDNISOLONE SODIUM SUCC 125 MG IJ SOLR
60.0000 mg | Freq: Two times a day (BID) | INTRAMUSCULAR | Status: DC
  Administered 2022-11-03 – 2022-11-05 (×5): 60 mg via INTRAVENOUS
  Filled 2022-11-03 (×5): qty 2

## 2022-11-03 MED ORDER — GABAPENTIN 100 MG PO CAPS: 100.0000 mg | ORAL_CAPSULE | Freq: Three times a day (TID) | ORAL | Status: DC | PRN

## 2022-11-03 NOTE — Hospital Course (Addendum)
87 year old female with a history of diabetes mellitus type 2, hypertension, hyperlipidemia, stroke, tobacco abuse, anxiety presenting with 1 week history of coryza, chest and nasal congestion, and generalized weakness. She states that her symptoms have worsened in the past 3 days PTA.  She has been taking OTC medications without improvement.  She had a virtual visit with her PCP on 10/31/2022.  The patient was prescribed amoxicillin for presumptive pansinusitis.  There was no improvement.  As result, she presented for further evaluation and treatment.  She states that over the past 2 to 3 days prior to admission she has developed a worsening cough, decreased oral intake, and dyspnea on exertion and shortness of breath.  She denies any chest pain, hemoptysis, nausea, vomiting or direct abdominal pain, dysuria. She denies any nausea, vomiting, diarrhea. In the ED, the patient was afebrile and hemodynamically stable with oxygen saturation 94% on room air.  WBC 7.7, hemoglobin 14.6, platelets 195,000.  Sodium 133, potassium 4.1, bicarbonate 28, serum creatinine 1.01.  LFTs were unremarkable.  BNP 359.  Troponin 17>> 14.  Chest x-ray showed increased interstitial markings.  The patient was started on intravenous furosemide and ceftriaxone and azithromycin.  She was admitted for further evaluation and treatment of her respiratory failure.

## 2022-11-03 NOTE — TOC Initial Note (Signed)
Transition of Care [facility]) - Initial/Assessment Note    Patient Details  Name: Caitlin Mccarthy MRN: [id number] Date of Birth: 1935-07-18  Transition of Care (TOC) CM/SW Contact:    Catalina Gravel, LCSW Phone Number: 11/03/2022, 3:41 PM  Clinical Narrative:                 Pt. From home. May have an 02 requirement at DC. TOC to follow.    Barriers to Discharge: Continued Medical Work up   Patient Goals and CMS Choice            Expected Discharge Plan and Services                                              Prior Living Arrangements/Services                       Activities of Daily Living Home Assistive Devices/Equipment: Environmental consultant (specify type), Wheelchair, Eyeglasses, CBG Meter ADL Screening (condition at time of admission) Patient's cognitive ability adequate to safely complete daily activities?: No Is the patient deaf or have difficulty hearing?: Yes Does the patient have difficulty seeing, even when wearing glasses/contacts?: Yes Does the patient have difficulty concentrating, remembering, or making decisions?: No Patient able to express need for assistance with ADLs?: Yes Does the patient have difficulty dressing or bathing?: No Independently performs ADLs?: No Communication: Independent Dressing (OT): Independent Grooming: Independent Feeding: Independent Bathing: Independent Toileting: Needs assistance Is this a change from baseline?: Change from baseline, expected to last <3 days In/Out Bed: Needs assistance Is this a change from baseline?: Change from baseline, expected to last <3 days Walks in Home: Independent Does the patient have difficulty walking or climbing stairs?: No Weakness of Legs: None Weakness of Arms/Hands: None  Permission Sought/Granted                  Emotional Assessment              Admission diagnosis:  Acute pulmonary edema (HCC) [J81.0] Acute respiratory failure with hypoxia (HCC)  [J96.01] Acute on chronic respiratory failure with hypoxia (HCC) [J96.21] Patient Active Problem List   Diagnosis Date Noted   Acute renal failure superimposed on stage 3a chronic kidney disease (HCC) 11/03/2022   Acute on chronic respiratory failure with hypoxia (HCC) 11/02/2022   Acute clinical systolic heart failure (HCC) 11/02/2022   COPD with acute exacerbation (HCC) 11/02/2022   Overactive bladder 10/25/2019   Closed fracture of fourth metatarsal bone 01/05/2019   Closed fracture of third metatarsal bone 01/05/2019   Pain management contract signed 06/11/2018   History of right hip replacement 12/16/2017   Vocal cord nodule 01/11/2017   Late effect of cerebrovascular accident (CVA) 01/10/2017   OA (osteoarthritis) 01/09/2017   History of kidney stones 01/09/2017   COPD (chronic obstructive pulmonary disease) (HCC) 11/09/2016   Hyperlipidemia due to type 2 diabetes mellitus (HCC) 11/09/2016   Glaucoma 05/10/2016   Vitamin D deficiency 05/10/2016   Current smoker 05/10/2016   Osteoarthritis of both hands 05/10/2016   History of total right hip replacement 05/10/2016   Osteoporosis 05/09/2016   Type 2 diabetes mellitus (HCC) 02/05/2016   Overweight (BMI 25.0-29.9) 12/28/2015   GAD (generalized anxiety disorder) 12/28/2015   Hypokalemia 06/02/2015   Chronic pain 04/13/2015   Essential hypertension, benign 10/02/2012   GERD (  gastroesophageal reflux disease) 10/02/2012   Depression 10/02/2012   OA (osteoarthritis) of hip 10/28/2011   PCP:  Bennie Pierini, FNP Pharmacy:   [facility] - [facility] [place], [place] - [address] [place] [address] Phone: [phone] Fax: [phone]     Social Determinants of Health (SDOH) Social History: SDOH Screenings   Food Insecurity: No Food Insecurity (11/02/2022)  Housing: Low Risk  (11/02/2022)  Transportation Needs: No Transportation Needs (11/02/2022)  Utilities: Not At Risk (11/02/2022)  Alcohol Screen:  Low Risk  (02/05/2022)  Depression (PHQ2-9): High Risk (08/30/2022)  Financial Resource Strain: Low Risk  (02/05/2022)  Physical Activity: Insufficiently Active (02/05/2022)  Social Connections: Moderately Integrated (02/05/2022)  Stress: No Stress Concern Present (02/05/2022)  Tobacco Use: High Risk (11/02/2022)   SDOH Interventions:     Readmission Risk Interventions     No data to display

## 2022-11-03 NOTE — Progress Notes (Addendum)
PROGRESS NOTE  Caitlin Mccarthy:[id number] DOB: January 21, 1936 DOA: 11/02/2022 PCP: Bennie Pierini, FNP  Brief History:  87 year old female with a history of diabetes mellitus type 2, hypertension, hyperlipidemia, stroke, tobacco abuse, anxiety presenting with 1 week history of coryza, chest and nasal congestion, and generalized weakness. She states that her symptoms have worsened in the past 3 days PTA.  She has been taking OTC medications without improvement.  She had a virtual visit with her PCP on 10/31/2022.  The patient was prescribed amoxicillin for presumptive pansinusitis.  There was no improvement.  As result, she presented for further evaluation and treatment.  She states that over the past 2 to 3 days prior to admission she has developed a worsening cough, decreased oral intake, and dyspnea on exertion and shortness of breath.  She denies any chest pain, hemoptysis, nausea, vomiting or direct abdominal pain, dysuria. She denies any nausea, vomiting, diarrhea. In the ED, the patient was afebrile and hemodynamically stable with oxygen saturation 94% on room air.  WBC 7.7, hemoglobin 14.6, platelets 195,000.  Sodium 133, potassium 4.1, bicarbonate 28, serum creatinine 1.01.  LFTs were unremarkable.  BNP 359.  Troponin 17>> 14.  Chest x-ray showed increased interstitial markings.  The patient was started on intravenous furosemide and ceftriaxone and azithromycin.  She was admitted for further evaluation and treatment of her respiratory failure.   Assessment/Plan: Acute respiratory failure with hypoxia -Secondary to COPD exacerbation and possible infectious process -Presented with tachypnea and oxygen saturation 84% on room air -Currently stable on 2 L nasal cannula -Wean oxygen as tolerated back to room air -Personally reviewed chest x-ray--increased interstitial markings, RLL opacity -Obtain CT chest  COPD exacerbation -Start Brovana -Start Pulmicort -Continue IV  Solu-Medrol -COVID-19 PCR negative -Viral respiratory panel--pending  Tobacco abuse -Patient has at least 30-pack-year history -She continues to smoke 1/2 pack/day -Tobacco cessation discussed  Hypercalcemia -review of medical records shows pt has had hypercalcemia dating back to 06/2015 -intact PTH -corrected calcium 10.7 at time of admission -TSH -25-vitamin D level  AKI on CKD 3a -pt given lasix IV 11/02/22 -baseline creatinine 0.8-10.0 -serum creatinine up to 1.67 -d/c lasix  Diabetes mellitus type 2 -Check hemoglobin A1c -Holding metformin -NovoLog sliding scale -Anticipate elevated CBGs secondary to steroids  Essential hypertension -Continue amlodipine -Holding benazepril temporarily -Holding chlorthalidone temporarily  Mixed hyperlipidemia -Continue statin  History of stroke -Continue aspirin  Anxiety -Continue home dose lorazepam 0.5 mg twice daily as needed anxiety -PDMP reviewed -Lorazepam 0.5 mg, #30, last refill 10/15/2022 -Tramadol 50 mg, #60, last refill 10/03/2022       Family Communication:   daughter updated at bedside 5/5  Consultants:  none  Code Status:  DNR  DVT Prophylaxis:  [place] Lovenox   Procedures: As Listed in Progress Note Above  Antibiotics: Ceftriaxone 5/4>> Azithro 5/4>>   Subjective: Patient states that her breathing is little better than yesterday.  She continues to have a nonproductive cough.  She has subjective fevers and chills.  She denies any chest pain, abdominal pain, nausea, vomiting, diarrhea, headache.  Objective: Vitals:   11/02/22 2330 11/03/22 0347 11/03/22 0500 11/03/22 0732  BP: (!) 112/45 (!) 144/52    Pulse: 89 95    Resp: 20 20    Temp: 97.9 F (36.6 C) 98.2 F (36.8 C)    TempSrc: Oral Oral    SpO2: 91% 92%  94%  Weight:   89.9 kg   Height:  Intake/Output Summary (Last 24 hours) at 11/03/2022 0804 Last data filed at 11/03/2022 0600 Gross per 24 hour  Intake 141.86 ml  Output --  Net  141.86 ml   Weight change:  Exam:  General:  Pt is alert, follows commands appropriately, not in acute distress HEENT: No icterus, No thrush, No neck mass, [place]/AT Cardiovascular: RRR, S1/S2, no rubs, no gallops Respiratory: Diminished breath sounds bilateral.  Bibasilar rales.  Bilateral expiratory wheeze. Abdomen: Soft/+BS, non tender, non distended, no guarding Extremities: No edema, No lymphangitis, No petechiae, No rashes, no synovitis   Data Reviewed: I have personally reviewed following labs and imaging studies Basic Metabolic Panel: Recent Labs  Lab 11/02/22 1436 11/03/22 0441  NA 133* 134*  K 4.1 4.1  CL 97* 97*  CO2 28 27  GLUCOSE 125* 216*  BUN 21 31*  CREATININE 1.01* 1.67*  CALCIUM 10.7* 10.3   Liver Function Tests: Recent Labs  Lab 11/02/22 1436  AST 26  ALT 19  ALKPHOS 45  BILITOT 0.6  PROT 7.8  ALBUMIN 4.0   No results for input(s): "LIPASE", "AMYLASE" in the last 168 hours. No results for input(s): "AMMONIA" in the last 168 hours. Coagulation Profile: No results for input(s): "INR", "PROTIME" in the last 168 hours. CBC: Recent Labs  Lab 11/02/22 1436 11/03/22 0441  WBC 7.7 5.9  NEUTROABS 4.7  --   HGB 14.6 12.5  HCT 45.1 39.9  MCV 97.2 99.5  PLT 195 172   Cardiac Enzymes: No results for input(s): "CKTOTAL", "CKMB", "CKMBINDEX", "TROPONINI" in the last 168 hours. BNP: Invalid input(s): "POCBNP" CBG: Recent Labs  Lab 11/02/22 2152  GLUCAP 297*   HbA1C: No results for input(s): "HGBA1C" in the last 72 hours. Urine analysis:    Component Value Date/Time   COLORURINE AMBER (A) 11/02/2022 2126   APPEARANCEUR CLOUDY (A) 11/02/2022 2126   APPEARANCEUR Clear 04/18/2022 1040   LABSPEC 1.019 11/02/2022 2126   PHURINE 5.0 11/02/2022 2126   GLUCOSEU NEGATIVE 11/02/2022 2126   HGBUR MODERATE (A) 11/02/2022 2126   BILIRUBINUR NEGATIVE 11/02/2022 2126   BILIRUBINUR Negative 04/18/2022 1040   KETONESUR NEGATIVE 11/02/2022 2126   PROTEINUR  100 (A) 11/02/2022 2126   UROBILINOGEN negative 08/17/2015 1409   UROBILINOGEN 0.2 04/13/2015 1451   NITRITE NEGATIVE 11/02/2022 2126   LEUKOCYTESUR LARGE (A) 11/02/2022 2126   Sepsis Labs: @LABRCNTIP (procalcitonin:4,lacticidven:4) ) Recent Results (from the past 240 hour(s))  SARS Coronavirus 2 by RT PCR (hospital order, performed in [facility] hospital lab) *cepheid single result test* Anterior Nasal Swab     Status: None   Collection Time: 11/02/22  9:40 PM   Specimen: Anterior Nasal Swab  Result Value Ref Range Status   SARS Coronavirus 2 by RT PCR NEGATIVE NEGATIVE Final    Comment: (NOTE) SARS-CoV-2 target nucleic acids are NOT DETECTED.  The SARS-CoV-2 RNA is generally detectable in upper and lower respiratory specimens during the acute phase of infection. The lowest concentration of SARS-CoV-2 viral copies this assay can detect is 250 copies / mL. A negative result does not preclude SARS-CoV-2 infection and should not be used as the sole basis for treatment or other patient management decisions.  A negative result may occur with improper specimen collection / handling, submission of specimen other than nasopharyngeal swab, presence of viral mutation(s) within the areas targeted by this assay, and inadequate number of viral copies (<250 copies / mL). A negative result must be combined with clinical observations, patient history, and epidemiological information.  Fact Sheet  for Patients:   RoadLapTop.co.za  Fact Sheet for Healthcare Providers: http://kim-miller.com/  This test is not yet approved or  cleared by the [place] FDA and has been authorized for detection and/or diagnosis of SARS-CoV-2 by FDA under an Emergency Use Authorization (EUA).  This EUA will remain in effect (meaning this test can be used) for the duration of the COVID-19 declaration under Section 564(b)(1) of the Act, 21 U.S.C. section 360bbb-3(b)(1),  unless the authorization is terminated or revoked sooner.  Performed at [facility], [address], [place], [place] [address]      Scheduled Meds:  amLODipine  10 mg Oral Daily   enoxaparin (LOVENOX) injection  40 mg Subcutaneous Q24H   fenofibrate  160 mg Oral Daily   furosemide  40 mg Intravenous BID   gabapentin  100 mg Oral TID   guaiFENesin  600 mg Oral BID   insulin aspart  0-15 Units Subcutaneous TID WC   insulin aspart  0-5 Units Subcutaneous QHS   insulin glargine-yfgn  15 Units Subcutaneous QHS   ipratropium-albuterol  3 mL Nebulization TID   loratadine  10 mg Oral QPM   methylPREDNISolone (SOLU-MEDROL) injection  60 mg Intravenous Q12H   Followed by   Melene Muller ON 11/04/2022] predniSONE  40 mg Oral Q breakfast   potassium chloride  20 mEq Oral BID   rosuvastatin  40 mg Oral Daily   Continuous Infusions:  albuterol 10 mg/hr (11/02/22 1647)   azithromycin 500 mg (11/02/22 1936)   cefTRIAXone (ROCEPHIN)  IV      Procedures/Studies: DG Chest Port 1 View  Result Date: 11/02/2022 CLINICAL DATA:  Shortness of breath. EXAM: PORTABLE CHEST 1 VIEW COMPARISON:  09/21/2019. FINDINGS: Implantable loop recorder projects over the left heart border. Mild pulmonary edema. No pleural effusion or pneumothorax. New mild left atrial enlargement. Unchanged atherosclerotic calcifications of the aortic arch. Visualized bones and upper abdomen are unremarkable. IMPRESSION: 1. Mild pulmonary edema with new mild left atrial enlargement. 2.  Aortic Atherosclerosis (ICD10-I70.0). Electronically Signed   By: Orvan Falconer M.D.   On: 11/02/2022 15:02    Catarina Hartshorn, DO  [facility]  If 7PM-7AM, please contact night-coverage [email or web address] Password [email or web address] 11/03/2022, 8:04 AM   LOS: 1 day

## 2022-11-03 NOTE — Progress Notes (Signed)
Attempted to wean patient to room air. VS completed around 1611 and o2 sats without supplemental oxygen was 87%. Placed patient back on 2L [place] and oxygen improved to 95%

## 2022-11-04 DIAGNOSIS — J9621 Acute and chronic respiratory failure with hypoxia: Secondary | ICD-10-CM | POA: Diagnosis not present

## 2022-11-04 DIAGNOSIS — J9601 Acute respiratory failure with hypoxia: Secondary | ICD-10-CM | POA: Diagnosis not present

## 2022-11-04 DIAGNOSIS — J441 Chronic obstructive pulmonary disease with (acute) exacerbation: Secondary | ICD-10-CM | POA: Diagnosis not present

## 2022-11-04 LAB — COMPREHENSIVE METABOLIC PANEL
ALT: 16 U/L (ref 0–44)
AST: 22 U/L (ref 15–41)
Albumin: 3.3 g/dL — ABNORMAL LOW (ref 3.5–5.0)
Alkaline Phosphatase: 36 U/L — ABNORMAL LOW (ref 38–126)
Anion gap: 7 (ref 5–15)
BUN: 37 mg/dL — ABNORMAL HIGH (ref 8–23)
CO2: 29 mmol/L (ref 22–32)
Calcium: 10.8 mg/dL — ABNORMAL HIGH (ref 8.9–10.3)
Chloride: 99 mmol/L (ref 98–111)
Creatinine, Ser: 1.1 mg/dL — ABNORMAL HIGH (ref 0.44–1.00)
GFR, Estimated: 50 mL/min — ABNORMAL LOW (ref >60)
Glucose, Bld: 213 mg/dL — ABNORMAL HIGH (ref 70–99)
Potassium: 4.9 mmol/L (ref 3.5–5.1)
Sodium: 135 mmol/L (ref 135–145)
Total Bilirubin: 0.4 mg/dL (ref 0.3–1.2)
Total Protein: 6.7 g/dL (ref 6.5–8.1)

## 2022-11-04 LAB — GLUCOSE, CAPILLARY
Glucose-Capillary: 219 mg/dL — ABNORMAL HIGH (ref 70–99)
Glucose-Capillary: 192 mg/dL — ABNORMAL HIGH (ref 70–99)
Glucose-Capillary: 256 mg/dL — ABNORMAL HIGH (ref 70–99)
Glucose-Capillary: 178 mg/dL — ABNORMAL HIGH (ref 70–99)

## 2022-11-04 LAB — CBC
HCT: 39.2 % (ref 36.0–46.0)
Hemoglobin: 12.6 g/dL (ref 12.0–15.0)
MCH: 31.7 pg (ref 26.0–34.0)
MCHC: 32.1 g/dL (ref 30.0–36.0)
MCV: 98.5 fL (ref 80.0–100.0)
Platelets: 184 10*3/uL (ref 150–400)
RBC: 3.98 MIL/uL (ref 3.87–5.11)
RDW: 13.1 % (ref 11.5–15.5)
WBC: 12.5 10*3/uL — ABNORMAL HIGH (ref 4.0–10.5)
nRBC: 0 % (ref 0.0–0.2)

## 2022-11-04 LAB — MAGNESIUM: Magnesium: 1.9 mg/dL (ref 1.7–2.4)

## 2022-11-04 MED ORDER — IPRATROPIUM-ALBUTEROL 0.5-2.5 (3) MG/3ML IN SOLN
3.0000 mL | Freq: Three times a day (TID) | RESPIRATORY_TRACT | Status: DC
  Administered 2022-11-05: 3 mL via RESPIRATORY_TRACT
  Filled 2022-11-04: qty 3

## 2022-11-04 NOTE — Progress Notes (Signed)
Attempted to wean patient to room air. Oxygen saturations dropped to 87-88% room air. Placed patient on 1L and sats came up to 91%

## 2022-11-04 NOTE — Progress Notes (Signed)
PROGRESS NOTE  Caitlin Mccarthy ZOX:[id number] DOB: 1935-08-20 DOA: 11/02/2022 PCP: Bennie Pierini, FNP  Brief History:  87 year old female with a history of diabetes mellitus type 2, hypertension, hyperlipidemia, stroke, tobacco abuse, anxiety presenting with 1 week history of coryza, chest and nasal congestion, and generalized weakness. She states that her symptoms have worsened in the past 3 days PTA.  She has been taking OTC medications without improvement.  She had a virtual visit with her PCP on 10/31/2022.  The patient was prescribed amoxicillin for presumptive pansinusitis.  There was no improvement.  As result, she presented for further evaluation and treatment.  She states that over the past 2 to 3 days prior to admission she has developed a worsening cough, decreased oral intake, and dyspnea on exertion and shortness of breath.  She denies any chest pain, hemoptysis, nausea, vomiting or direct abdominal pain, dysuria. She denies any nausea, vomiting, diarrhea. In the ED, the patient was afebrile and hemodynamically stable with oxygen saturation 94% on room air.  WBC 7.7, hemoglobin 14.6, platelets 195,000.  Sodium 133, potassium 4.1, bicarbonate 28, serum creatinine 1.01.  LFTs were unremarkable.  BNP 359.  Troponin 17>> 14.  Chest x-ray showed increased interstitial markings.  The patient was started on intravenous furosemide and ceftriaxone and azithromycin.  She was admitted for further evaluation and treatment of her respiratory failure.   Assessment/Plan:  Acute respiratory failure with hypoxia -Secondary to COPD exacerbation and possible infectious process -Presented with tachypnea and oxygen saturation 84% on room air -Currently stable on 2 L nasal cannula -Wean oxygen as tolerated back to room air -Personally reviewed chest x-ray--increased interstitial markings, RLL opacity -Obtain CT chest   COPD exacerbation -continue Brovana -continue Pulmicort -Continue  IV Solu-Medrol -COVID-19 PCR negative -Viral respiratory panel>>POSITIVE Coronavirus OC43   Tobacco abuse -Patient has at least 30-pack-year history -She continues to smoke 1/2 pack/day -Tobacco cessation discussed   Hypercalcemia -review of medical records shows pt has had hypercalcemia dating back to 06/2015 -intact PTH--pending -corrected calcium 10.7 at time of admission -TSH--0.510 -25-vitamin D level--16.53   AKI on CKD 3a -pt given lasix IV 11/02/22 -baseline creatinine 0.8-10.0 -serum creatinine up to 1.67 -d/c lasix>>improved   Diabetes mellitus type 2 -Check hemoglobin A1c--6.7 -Holding metformin -NovoLog sliding scale -Anticipate elevated CBGs secondary to steroids   Essential hypertension -Continue amlodipine -Holding benazepril temporarily -Holding chlorthalidone temporarily   Mixed hyperlipidemia -Continue statin   History of stroke -Continue aspirin   Anxiety -Continue home dose lorazepam 0.5 mg twice daily as needed anxiety -PDMP reviewed -Lorazepam 0.5 mg, #30, last refill 10/15/2022 -Tramadol 50 mg, #60, last refill 10/03/2022           Family Communication:   daughter updated at bedside 5/6  Consultants:  none  Code Status:  DNR  DVT Prophylaxis: [place] Lovenox   Procedures: As Listed in Progress Note Above  Antibiotics: Ceftriaxone 5/4>> Azithro 5/4>>        Subjective: Pt is breathing better, but still has some dyspnea on exertion.  Denies f/c, cp, n/v/d  Objective: Vitals:   11/04/22 0928 11/04/22 1332 11/04/22 1348 11/04/22 1433  BP: (!) 146/61  (!) 131/52   Pulse: 82  90   Resp:      Temp:   97.9 F (36.6 C)   TempSrc:      SpO2:  96% 91% 91%  Weight:      Height:  Intake/Output Summary (Last 24 hours) at 11/04/2022 1724 Last data filed at 11/04/2022 1300 Gross per 24 hour  Intake 828.15 ml  Output --  Net 828.15 ml   Weight change: -0.619 kg Exam:  General:  Pt is alert, follows commands  appropriately, not in acute distress HEENT: No icterus, No thrush, No neck mass, [place]/AT Cardiovascular: RRR, S1/S2, no rubs, no gallops Respiratory: bibasilar rales.  Bibasilar wheeze Abdomen: Soft/+BS, non tender, non distended, no guarding Extremities: No edema, No lymphangitis, No petechiae, No rashes, no synovitis   Data Reviewed: I have personally reviewed following labs and imaging studies Basic Metabolic Panel: Recent Labs  Lab 11/02/22 1436 11/03/22 0441 11/04/22 0439  NA 133* 134* 135  K 4.1 4.1 4.9  CL 97* 97* 99  CO2 28 27 29   GLUCOSE 125* 216* 213*  BUN 21 31* 37*  CREATININE 1.01* 1.67* 1.10*  CALCIUM 10.7* 10.3 10.8*  MG  --   --  1.9   Liver Function Tests: Recent Labs  Lab 11/02/22 1436 11/04/22 0439  AST 26 22  ALT 19 16  ALKPHOS 45 36*  BILITOT 0.6 0.4  PROT 7.8 6.7  ALBUMIN 4.0 3.3*   No results for input(s): "LIPASE", "AMYLASE" in the last 168 hours. No results for input(s): "AMMONIA" in the last 168 hours. Coagulation Profile: No results for input(s): "INR", "PROTIME" in the last 168 hours. CBC: Recent Labs  Lab 11/02/22 1436 11/03/22 0441 11/04/22 0439  WBC 7.7 5.9 12.5*  NEUTROABS 4.7  --   --   HGB 14.6 12.5 12.6  HCT 45.1 39.9 39.2  MCV 97.2 99.5 98.5  PLT 195 172 184   Cardiac Enzymes: No results for input(s): "CKTOTAL", "CKMB", "CKMBINDEX", "TROPONINI" in the last 168 hours. BNP: Invalid input(s): "POCBNP" CBG: Recent Labs  Lab 11/03/22 1223 11/03/22 1634 11/03/22 2134 11/04/22 0810 11/04/22 1125  GLUCAP 200* 229* 129* 219* 192*   HbA1C: Recent Labs    11/03/22 0441  HGBA1C 6.7*   Urine analysis:    Component Value Date/Time   COLORURINE AMBER (A) 11/02/2022 2126   APPEARANCEUR CLOUDY (A) 11/02/2022 2126   APPEARANCEUR Clear 04/18/2022 1040   LABSPEC 1.019 11/02/2022 2126   PHURINE 5.0 11/02/2022 2126   GLUCOSEU NEGATIVE 11/02/2022 2126   HGBUR MODERATE (A) 11/02/2022 2126   BILIRUBINUR NEGATIVE 11/02/2022  2126   BILIRUBINUR Negative 04/18/2022 1040   KETONESUR NEGATIVE 11/02/2022 2126   PROTEINUR 100 (A) 11/02/2022 2126   UROBILINOGEN negative 08/17/2015 1409   UROBILINOGEN 0.2 04/13/2015 1451   NITRITE NEGATIVE 11/02/2022 2126   LEUKOCYTESUR LARGE (A) 11/02/2022 2126   Sepsis Labs: @LABRCNTIP (procalcitonin:4,lacticidven:4) ) Recent Results (from the past 240 hour(s))  Respiratory (~20 pathogens) panel by PCR     Status: Abnormal   Collection Time: 11/02/22  9:40 PM   Specimen: Nasopharyngeal Swab; Respiratory  Result Value Ref Range Status   Adenovirus NOT DETECTED NOT DETECTED Final   Coronavirus 229E NOT DETECTED NOT DETECTED Final    Comment: (NOTE) The Coronavirus on the Respiratory Panel, DOES NOT test for the novel  Coronavirus (2019 nCoV)    Coronavirus HKU1 NOT DETECTED NOT DETECTED Final   Coronavirus NL63 NOT DETECTED NOT DETECTED Final   Coronavirus OC43 DETECTED (A) NOT DETECTED Final   Metapneumovirus NOT DETECTED NOT DETECTED Final   Rhinovirus / Enterovirus NOT DETECTED NOT DETECTED Final   Influenza A NOT DETECTED NOT DETECTED Final   Influenza B NOT DETECTED NOT DETECTED Final   Parainfluenza Virus 1 NOT  DETECTED NOT DETECTED Final   Parainfluenza Virus 2 NOT DETECTED NOT DETECTED Final   Parainfluenza Virus 3 NOT DETECTED NOT DETECTED Final   Parainfluenza Virus 4 NOT DETECTED NOT DETECTED Final   Respiratory Syncytial Virus NOT DETECTED NOT DETECTED Final   Bordetella pertussis NOT DETECTED NOT DETECTED Final   Bordetella Parapertussis NOT DETECTED NOT DETECTED Final   Chlamydophila pneumoniae NOT DETECTED NOT DETECTED Final   Mycoplasma pneumoniae NOT DETECTED NOT DETECTED Final    Comment: Performed at [facility], [address], [place], [place] [address]  SARS Coronavirus 2 by RT PCR (hospital order, performed in [facility] hospital lab) *cepheid single result test* Anterior Nasal Swab     Status: None   Collection Time: 11/02/22  9:40 PM    Specimen: Anterior Nasal Swab  Result Value Ref Range Status   SARS Coronavirus 2 by RT PCR NEGATIVE NEGATIVE Final    Comment: (NOTE) SARS-CoV-2 target nucleic acids are NOT DETECTED.  The SARS-CoV-2 RNA is generally detectable in upper and lower respiratory specimens during the acute phase of infection. The lowest concentration of SARS-CoV-2 viral copies this assay can detect is 250 copies / mL. A negative result does not preclude SARS-CoV-2 infection and should not be used as the sole basis for treatment or other patient management decisions.  A negative result may occur with improper specimen collection / handling, submission of specimen other than nasopharyngeal swab, presence of viral mutation(s) within the areas targeted by this assay, and inadequate number of viral copies (<250 copies / mL). A negative result must be combined with clinical observations, patient history, and epidemiological information.  Fact Sheet for Patients:   RoadLapTop.co.za  Fact Sheet for Healthcare Providers: http://kim-miller.com/  This test is not yet approved or  cleared by the [place] FDA and has been authorized for detection and/or diagnosis of SARS-CoV-2 by FDA under an Emergency Use Authorization (EUA).  This EUA will remain in effect (meaning this test can be used) for the duration of the COVID-19 declaration under Section 564(b)(1) of the Act, 21 U.S.C. section 360bbb-3(b)(1), unless the authorization is terminated or revoked sooner.  Performed at [facility], [address] [place], [place] [address]      Scheduled Meds:  amLODipine  10 mg Oral Daily   arformoterol  15 mcg Nebulization BID   aspirin  81 mg Oral Daily   budesonide (PULMICORT) nebulizer solution  0.5 mg Nebulization BID   enoxaparin (LOVENOX) injection  40 mg Subcutaneous Q24H   fenofibrate  160 mg Oral Daily   guaiFENesin  600 mg Oral BID   insulin aspart  0-15  Units Subcutaneous TID WC   insulin aspart  0-5 Units Subcutaneous QHS   insulin glargine-yfgn  15 Units Subcutaneous QHS   ipratropium-albuterol  3 mL Nebulization Q6H   loratadine  10 mg Oral QPM   methylPREDNISolone (SOLU-MEDROL) injection  60 mg Intravenous Q12H   rosuvastatin  40 mg Oral Daily   Continuous Infusions:  azithromycin Stopped (11/03/22 1759)   cefTRIAXone (ROCEPHIN)  IV Stopped (11/03/22 1833)    Procedures/Studies: CT CHEST WO CONTRAST  Result Date: 11/03/2022 CLINICAL DATA:  Cough EXAM: CT CHEST WITHOUT CONTRAST TECHNIQUE: Multidetector CT imaging of the chest was performed following the standard protocol without IV contrast. RADIATION DOSE REDUCTION: This exam was performed according to the departmental dose-optimization program which includes automated exposure control, adjustment of the mA and/or kV according to patient size and/or use of iterative reconstruction technique. COMPARISON:  Chest CT dated May 03, 2019 FINDINGS: Cardiovascular: Normal heart size. No pericardial effusion. Normal caliber thoracic aorta with severe atherosclerotic disease. Severe coronary artery calcifications. Mediastinum/Nodes: Esophagus and thyroid are unremarkable. No enlarged lymph nodes seen in the chest. Lungs/Pleura: Central airways are patent. Evaluation of the lungs is somewhat limited due to motion artifact. Mild opacity of the anterior right upper lobe, likely due to scarring or atelectasis. Previously described 4 mm left lower lobe pulmonary nodule is not seen on today's exam. No consolidation, pleural effusion or pneumothorax. Upper Abdomen: Bilateral adrenal gland thickening, unchanged when compared with the prior exam. No acute abnormality. Musculoskeletal: No chest wall mass or suspicious bone lesions identified. IMPRESSION: 1. No evidence of acute airspace opacity, evaluation somewhat limited due to motion artifact. 2. Severe coronary artery calcifications and aortic  Atherosclerosis (ICD10-I70.0). Electronically Signed   By: Allegra Lai M.D.   On: 11/03/2022 17:33   ECHOCARDIOGRAM COMPLETE  Result Date: 11/03/2022    ECHOCARDIOGRAM REPORT   Patient Name:   Caitlin Mccarthy Date of Exam: 11/03/2022 Medical Rec #:  [id number]         Height:       70.0 in Accession #:    [id number]        Weight:       198.2 lb Date of Birth:  1935/08/01        BSA:          2.079 m Patient Age:    86 years          BP:           144/52 mmHg Patient Gender: F                 HR:           73 bpm. [facility] Procedure: 2D Echo, Color Doppler and Cardiac Doppler Indications:    I50.21 Acute systolic (congestive) heart failure  History:        Patient has prior history of Echocardiogram examinations, most                 recent 01/10/2017. COPD; Risk Factors:Hypertension, Diabetes and                 Dyslipidemia.  Sonographer:    Irving Burton Senior RDCS Referring Phys: [phone] JACOB J STINSON  Sonographer Comments: Technically difficult due to COPD IMPRESSIONS  1. Left ventricular ejection fraction, by estimation, is >75%. The left ventricle has hyperdynamic function. The left ventricle has no regional wall motion abnormalities. Left ventricular diastolic parameters are indeterminate.  2. Right ventricular systolic function is normal. The right ventricular size is normal. Tricuspid regurgitation signal is inadequate for assessing PA pressure.  3. No obvious SAM (systolic anterior motion of mitral valve). The mitral valve is normal in structure. Trivial mitral valve regurgitation. No evidence of mitral stenosis.  4. The aortic valve is normal in structure. Aortic valve regurgitation is not visualized. No aortic stenosis is present.  5. The inferior vena cava is dilated in size with >50% respiratory variability, suggesting right atrial pressure of 8 mmHg. FINDINGS  Left Ventricle: Left ventricular ejection fraction, by estimation, is >75%. The left ventricle has hyperdynamic function. The  left ventricle has no regional wall motion abnormalities. The left ventricular internal cavity size was normal in size. There is no left ventricular hypertrophy. Left ventricular diastolic parameters are indeterminate. Right Ventricle: The right ventricular size is normal. No increase in right ventricular wall  thickness. Right ventricular systolic function is normal. Tricuspid regurgitation signal is inadequate for assessing PA pressure. Left Atrium: Left atrial size was normal in size. Right Atrium: Right atrial size was normal in size. Pericardium: There is no evidence of pericardial effusion. Presence of epicardial fat layer. Mitral Valve: No obvious SAM (systolic anterior motion of mitral valve). The mitral valve is normal in structure. Trivial mitral valve regurgitation. No evidence of mitral valve stenosis. Tricuspid Valve: The tricuspid valve is normal in structure. Tricuspid valve regurgitation is not demonstrated. No evidence of tricuspid stenosis. Aortic Valve: The aortic valve is normal in structure. Aortic valve regurgitation is not visualized. No aortic stenosis is present. Pulmonic Valve: The pulmonic valve was normal in structure. Pulmonic valve regurgitation is not visualized. No evidence of pulmonic stenosis. Aorta: The aortic root is normal in size and structure. Venous: The inferior vena cava is dilated in size with greater than 50% respiratory variability, suggesting right atrial pressure of 8 mmHg. IAS/Shunts: No atrial level shunt detected by color flow Doppler.  LEFT VENTRICLE PLAX 2D LVIDd:         4.20 cm LVIDs:         2.70 cm LV PW:         1.00 cm LV IVS:        1.00 cm LVOT diam:     2.10 cm LV SV:         89 LV SV Index:   43 LVOT Area:     3.46 cm  RIGHT VENTRICLE RV S prime:     10.69 cm/s TAPSE (M-mode): 1.8 cm LEFT ATRIUM             Index        RIGHT ATRIUM           Index LA diam:        3.60 cm 1.73 cm/m   RA Area:     16.50 cm LA Vol (A2C):   45.2 ml 21.74 ml/m  RA Volume:    40.50 ml  19.48 ml/m LA Vol (A4C):   53.2 ml 25.59 ml/m LA Biplane Vol: 51.4 ml 24.72 ml/m  AORTIC VALVE LVOT Vmax:   118.33 cm/s LVOT Vmean:  86.667 cm/s LVOT VTI:    0.256 m  AORTA Ao Root diam: 3.20 cm Ao Asc diam:  3.50 cm  SHUNTS Systemic VTI:  0.26 m Systemic Diam: 2.10 cm Donato Schultz MD Electronically signed by Donato Schultz MD Signature Date/Time: 11/03/2022/11:28:46 AM    Final    DG Chest Port 1 View  Result Date: 11/02/2022 CLINICAL DATA:  Shortness of breath. EXAM: PORTABLE CHEST 1 VIEW COMPARISON:  09/21/2019. FINDINGS: Implantable loop recorder projects over the left heart border. Mild pulmonary edema. No pleural effusion or pneumothorax. New mild left atrial enlargement. Unchanged atherosclerotic calcifications of the aortic arch. Visualized bones and upper abdomen are unremarkable. IMPRESSION: 1. Mild pulmonary edema with new mild left atrial enlargement. 2.  Aortic Atherosclerosis (ICD10-I70.0). Electronically Signed   By: Orvan Falconer M.D.   On: 11/02/2022 15:02    Catarina Hartshorn, DO  [facility]  If 7PM-7AM, please contact night-coverage [email or web address] Password [email or web address] 11/04/2022, 5:24 PM   LOS: 2 days

## 2022-11-04 NOTE — Progress Notes (Signed)
SATURATION QUALIFICATIONS: (This note is used to comply with regulatory documentation for home oxygen)  Patient Saturations on Room Air at Rest = 91%  Patient Saturations on Room Air while Ambulating = 86%

## 2022-11-04 NOTE — Evaluation (Signed)
Physical Therapy Evaluation Patient Details Name: Caitlin Mccarthy MRN: [id number] DOB: Nov 29, 1935 Today's Date: 11/04/2022  History of Present Illness  Caitlin Mccarthy is a 87 y.o. female with medical history significant of type 2 diabetes, hypertension, hyperlipidemia, history of stroke, GERD.  She is a smoker.  Over the past week, she has been having increased congestion which she attributed to her sinuses.  She has been in her bed pretty much the whole day and feeling weak and tired.  Her appetite has been diminished.  She did have a virtual visit with her provider who prescribed her amoxicillin.  She has taken 3 doses, but has not felt any better.  She has been feeling short of breath when she gets up and goes to the bathroom.  She is able to walk approximately 75 feet before she gets short of breath.  Denies fevers, chills, nausea, vomiting.  Denies chest pain and abdominal pain.  On arrival she was 84% on room air which quickly went to the low 90s on nasal cannula   Clinical Impression  Patient functioning near baseline for functional mobility and gait other than SpO2 dropping from 93% to 87% during ambulation in room/hallway, otherwise demonstrates good return for bed mobility, transfers and ambulation using her Rollator without loss of balance.  Patient tolerated sitting up at bedside with SpO2 at 96% while on room air - nurse notified.  Patient encouraged to ambulate with family members/nursing staff for length of stay.  Plan:  Patient discharged from physical therapy to care of nursing for ambulation daily as tolerated for length of stay.           Recommendations for follow up therapy are one component of a multi-disciplinary discharge planning process, led by the attending physician.  Recommendations may be updated based on patient status, additional functional criteria and insurance authorization.  Follow Up Recommendations       Assistance Recommended at Discharge PRN  Patient  can return home with the following  Help with stairs or ramp for entrance    Equipment Recommendations None recommended by PT  Recommendations for Other Services       Functional Status Assessment Patient has had a recent decline in their functional status and demonstrates the ability to make significant improvements in function in a reasonable and predictable amount of time.     Precautions / Restrictions Precautions Precautions: Fall Restrictions Weight Bearing Restrictions: No      Mobility  Bed Mobility Overal bed mobility: Modified Independent, Independent                  Transfers Overall transfer level: Modified independent                      Ambulation/Gait Ambulation/Gait assistance: Modified independent (Device/Increase time) Gait Distance (Feet): 100 Feet Assistive device: Rollator (4 wheels) Gait Pattern/deviations: Decreased step length - right, Decreased step length - left, Decreased stride length Gait velocity: decreased     General Gait Details: slightly labored cadence without loss of balance with good return for ambulating in room, hallway without loss of balance, on room air with SpO2 at 87%  Stairs            Wheelchair Mobility    Modified Rankin (Stroke Patients Only)       Balance Overall balance assessment: Needs assistance Sitting-balance support: Feet supported, No upper extremity supported Sitting balance-Leahy Scale: Good Sitting balance - Comments: seated at EOB   Standing  balance support: During functional activity, Bilateral upper extremity supported Standing balance-Leahy Scale: Fair Standing balance comment: fair/good using RW                             Pertinent Vitals/Pain Pain Assessment Pain Assessment: No/denies pain    Home Living Family/patient expects to be discharged to:: Private residence Living Arrangements: Spouse/significant other Available Help at Discharge: Family Type  of Home: House Home Access: Stairs to enter Entrance Stairs-Rails: None Entrance Stairs-Number of Steps: 1     Home Equipment: Agricultural consultant (2 wheels);Cane - single point;Shower seat      Prior Function Prior Level of Function : Needs assist       Physical Assist : Mobility (physical);ADLs (physical) Mobility (physical): Bed mobility;Transfers;Gait;Stairs   Mobility Comments: household and short distanced Orthoptist ADLs Comments: Assisted by family     Hand Dominance   Dominant Hand: Right    Extremity/Trunk Assessment   Upper Extremity Assessment Upper Extremity Assessment: Overall WFL for tasks assessed    Lower Extremity Assessment Lower Extremity Assessment: Overall WFL for tasks assessed    Cervical / Trunk Assessment Cervical / Trunk Assessment: Normal  Communication   Communication: No difficulties  Cognition Arousal/Alertness: Awake/alert Behavior During Therapy: WFL for tasks assessed/performed Overall Cognitive Status: Within Functional Limits for tasks assessed                                          General Comments      Exercises     Assessment/Plan    PT Assessment Patient does not need any further PT services  PT Problem List         PT Treatment Interventions      PT Goals (Current goals can be found in the Care Plan section)  Acute Rehab PT Goals Patient Stated Goal: return home with family to assist PT Goal Formulation: With patient/family Time For Goal Achievement: 11/04/22 Potential to Achieve Goals: Good    Frequency       Co-evaluation               AM-PAC PT "6 Clicks" Mobility  Outcome Measure Help needed turning from your back to your side while in a flat bed without using bedrails?: None Help needed moving from lying on your back to sitting on the side of a flat bed without using bedrails?: None Help needed moving to and from a bed to a chair (including a  wheelchair)?: None Help needed standing up from a chair using your arms (e.g., wheelchair or bedside chair)?: None Help needed to walk in hospital room?: None Help needed climbing 3-5 steps with a railing? : A Little 6 Click Score: 23    End of Session Equipment Utilized During Treatment: Oxygen Activity Tolerance: Patient tolerated treatment well;Patient limited by fatigue Patient left: in bed;with call bell/phone within reach;with family/visitor present Nurse Communication: Mobility status PT Visit Diagnosis: Unsteadiness on feet (R26.81);Other abnormalities of gait and mobility (R26.89);Muscle weakness (generalized) (M62.81)    Time: 0981-1914 PT Time Calculation (min) (ACUTE ONLY): 28 min   Charges:   PT Evaluation $PT Eval Moderate Complexity: 1 Mod PT Treatments $Therapeutic Activity: 23-37 mins        2:23 PM, 11/04/22 Ocie Bob, MPT Physical Therapist with [facility] [phone] office [phone] mobile  phone

## 2022-11-05 ENCOUNTER — Telehealth: Payer: Self-pay | Admitting: "Endocrinology

## 2022-11-05 DIAGNOSIS — J9601 Acute respiratory failure with hypoxia: Secondary | ICD-10-CM | POA: Diagnosis not present

## 2022-11-05 DIAGNOSIS — J9621 Acute and chronic respiratory failure with hypoxia: Secondary | ICD-10-CM | POA: Diagnosis not present

## 2022-11-05 DIAGNOSIS — J441 Chronic obstructive pulmonary disease with (acute) exacerbation: Secondary | ICD-10-CM | POA: Diagnosis not present

## 2022-11-05 LAB — COMPREHENSIVE METABOLIC PANEL
ALT: 19 U/L (ref 0–44)
AST: 27 U/L (ref 15–41)
Albumin: 3.6 g/dL (ref 3.5–5.0)
Alkaline Phosphatase: 38 U/L (ref 38–126)
Anion gap: 9 (ref 5–15)
BUN: 33 mg/dL — ABNORMAL HIGH (ref 8–23)
CO2: 27 mmol/L (ref 22–32)
Calcium: 11.3 mg/dL — ABNORMAL HIGH (ref 8.9–10.3)
Chloride: 98 mmol/L (ref 98–111)
Creatinine, Ser: 0.89 mg/dL (ref 0.44–1.00)
GFR, Estimated: >60 mL/min (ref >60)
Glucose, Bld: 228 mg/dL — ABNORMAL HIGH (ref 70–99)
Potassium: 4.5 mmol/L (ref 3.5–5.1)
Sodium: 134 mmol/L — ABNORMAL LOW (ref 135–145)
Total Bilirubin: 0.5 mg/dL (ref 0.3–1.2)
Total Protein: 7 g/dL (ref 6.5–8.1)

## 2022-11-05 LAB — PARATHYROID HORMONE, INTACT (NO CA): PTH: 63 pg/mL (ref 15–65)

## 2022-11-05 LAB — GLUCOSE, CAPILLARY
Glucose-Capillary: 190 mg/dL — ABNORMAL HIGH (ref 70–99)
Glucose-Capillary: 228 mg/dL — ABNORMAL HIGH (ref 70–99)

## 2022-11-05 MED ORDER — PREDNISONE 50 MG PO TABS: 50.0000 mg | ORAL_TABLET | Freq: Every day | ORAL | 0 refills | Status: DC

## 2022-11-05 MED ORDER — PREDNISONE 20 MG PO TABS: 50.0000 mg | ORAL_TABLET | Freq: Every day | ORAL | Status: DC

## 2022-11-05 NOTE — Telephone Encounter (Signed)
-----   Message from Roma Kayser, MD sent at 11/05/2022 10:28 AM EDT ----- Regarding: FW: outpatient referral for hospital patient [place] , Accept as a routine consult. Thanks.  ----- Message ----- From: Catarina Hartshorn, MD Sent: 11/05/2022   9:59 AM EDT To: Roma Kayser, MD Subject: outpatient referral for hospital patient       Can you please see this patient for primary hyperparathyroidism? She is being discharged from the hospital today.  Thank you,  Onalee Hua

## 2022-11-05 NOTE — TOC Transition Note (Signed)
Transition of Care [facility]) - CM/SW Discharge Note   Patient Details  Name: Caitlin Mccarthy MRN: [id number] Date of Birth: May 24, 1936  Transition of Care [facility]) CM/SW Contact:  Leitha Bleak, RN Phone Number: 11/05/2022, 9:49 AM   Clinical Narrative:   Patient discharging home with the need of home oxygen.  Patient has [facility] accepting the referral and will deliver oxygen. MD/RN updated. CHF education added to AVS. No PT follow up needed.     Final next level of care: Home/Self Care Barriers to Discharge: Barriers Resolved   Patient Goals and CMS Choice CMS Medicare.gov Compare Post Acute Care list provided to:: Patient Choice offered to / list presented to : Patient  Discharge Placement     Patient and family notified of of transfer: 11/05/22  Discharge Plan and Services Additional resources added to the After Visit Summary for                 DME Arranged: Oxygen DME Agency: [facility] Date DME Agency Contacted: 11/05/22 Time DME Agency Contacted: [phone] Representative spoke with at DME Agency: Barbara Cower     Social Determinants of Health (SDOH) Interventions SDOH Screenings   Food Insecurity: No Food Insecurity (11/02/2022)  Housing: Low Risk  (11/02/2022)  Transportation Needs: No Transportation Needs (11/02/2022)  Utilities: Not At Risk (11/02/2022)  Alcohol Screen: Low Risk  (02/05/2022)  Depression (PHQ2-9): High Risk (08/30/2022)  Financial Resource Strain: Low Risk  (02/05/2022)  Physical Activity: Insufficiently Active (02/05/2022)  Social Connections: Moderately Integrated (02/05/2022)  Stress: No Stress Concern Present (02/05/2022)  Tobacco Use: High Risk (11/02/2022)    Readmission Risk Interventions    11/05/2022    9:48 AM  Readmission Risk Prevention Plan  Transportation Screening Complete  PCP or Specialist Appt within 5-7 Days Not Complete  Home Care Screening Complete  Medication Review (RN CM) Complete

## 2022-11-05 NOTE — Care Management Important Message (Signed)
Important Message  Patient Details  Name: ESCARLETT STEGE MRN: [id number] Date of Birth: [birth year]   Medicare Important Message Given:  Yes     Corey Harold 11/05/2022, 12:02 PM

## 2022-11-05 NOTE — Telephone Encounter (Signed)
Patient is scheduled   

## 2022-11-05 NOTE — Progress Notes (Signed)
SATURATION QUALIFICATIONS: (This note is used to comply with regulatory documentation for home oxygen)   Patient Saturations on Room Air at Rest = 90   Patient Saturations on Room Air while Ambulating = 86   Patient Saturations on 2 Liters of oxygen while Ambulating = 95   Please briefly explain why patient needs home oxygen: To maintain 02 sat at 90% or above during ambulation.  Catarina Hartshorn, DO

## 2022-11-05 NOTE — Progress Notes (Signed)
Went over discharge information with pt and daughter.

## 2022-11-05 NOTE — Discharge Summary (Signed)
Physician Discharge Summary   Patient: Caitlin Mccarthy MRN: [id number] DOB: [birth year]  Admit date:     11/02/2022  Discharge date: 11/05/22  Discharge Physician: Onalee Hua Caterin Tabares   PCP: Bennie Pierini, FNP   Recommendations at discharge:   Please follow up with primary care provider within 1-2 weeks  Please repeat BMP and CBC in one week  Please repeat ambulatory pulse on RA in 2-3 weeks   Discharge Diagnoses:   Hospital Course: 87 year old female with a history of diabetes mellitus type 2, hypertension, hyperlipidemia, stroke, tobacco abuse, anxiety presenting with 1 week history of coryza, chest and nasal congestion, and generalized weakness. She states that her symptoms have worsened in the past 3 days PTA.  She has been taking OTC medications without improvement.  She had a virtual visit with her PCP on 10/31/2022.  The patient was prescribed amoxicillin for presumptive pansinusitis.  There was no improvement.  As result, she presented for further evaluation and treatment.  She states that over the past 2 to 3 days prior to admission she has developed a worsening cough, decreased oral intake, and dyspnea on exertion and shortness of breath.  She denies any chest pain, hemoptysis, nausea, vomiting or direct abdominal pain, dysuria. She denies any nausea, vomiting, diarrhea. In the ED, the patient was afebrile and hemodynamically stable with oxygen saturation 94% on room air.  WBC 7.7, hemoglobin 14.6, platelets 195,000.  Sodium 133, potassium 4.1, bicarbonate 28, serum creatinine 1.01.  LFTs were unremarkable.  BNP 359.  Troponin 17>> 14.  Chest x-ray showed increased interstitial markings.  The patient was started on intravenous furosemide and ceftriaxone and azithromycin.  She was admitted for further evaluation and treatment of her respiratory failure.  Assessment and Plan: Acute respiratory failure with hypoxia -Secondary to COPD exacerbation and possible infectious  process -Presented with tachypnea and oxygen saturation 84% on room air -Currently stable on 2 L nasal cannula -Wean oxygen as tolerated back to room air -Personally reviewed chest x-ray--increased interstitial markings, RLL opacity -5/5 CT chest--no edema, no consolidation -pt will be set up with 2L [place] for discharge home as she desaturated with ambulation on day of dc   COPD exacerbation -continue Brovana -continue Pulmicort -Continue IV Solu-Medrol>>d/c home with prednisone x 3 more days -COVID-19 PCR negative -Viral respiratory panel>>POSITIVE Coronavirus OC43   Tobacco abuse -Patient has at least 30-pack-year history -She continues to smoke 1/2 pack/day -Tobacco cessation discussed   Hypercalcemia -review of medical records shows pt has had hypercalcemia dating back to 06/2015 -intact PTH--pending at time of dc -suspect primary hyperparathyroidism -corrected calcium 10.7 at time of admission -TSH--0.510 -25-vitamin D level--16.53 -d/c chlorthalidone -referral sent to outpt endocrine   AKI on CKD 3a -pt given lasix IV 11/02/22 -baseline creatinine 0.8-10.0 -serum creatinine up to 1.67 -d/c lasix>>improved -serum creatinine 0.89 on day of d/c   Diabetes mellitus type 2 -Check hemoglobin A1c--6.7 -Holding metformin>>restart after d/c -NovoLog sliding scale -Anticipate elevated CBGs secondary to steroids   Essential hypertension -Continue amlodipine -Holding benazepril temporarily -Holding chlorthalidone temporarily   Mixed hyperlipidemia -Continue statin   History of stroke -Continue aspirin   Anxiety -Continue home dose lorazepam 0.5 mg twice daily as needed anxiety -PDMP reviewed -Lorazepam 0.5 mg, #30, last refill 10/15/2022 -Tramadol 50 mg, #60, last refill 10/03/2022          Consultants: none Procedures performed: none  Disposition: Home Diet recommendation:  Carb modified diet DISCHARGE MEDICATION: Allergies as of 11/05/2022  Reactions    Iodinated Contrast Media    Latex         Medication List     STOP taking these medications    amoxicillin 875 MG tablet Commonly known as: AMOXIL   chlorthalidone 25 MG tablet Commonly known as: HYGROTON   doxycycline 100 MG capsule Commonly known as: VIBRAMYCIN   fluticasone 50 MCG/ACT nasal spray Commonly known as: FLONASE   hydrOXYzine 25 MG capsule Commonly known as: VISTARIL   omeprazole 40 MG capsule Commonly known as: PRILOSEC       TAKE these medications    amLODipine-benazepril 10-40 MG capsule Commonly known as: LOTREL Take 1 capsule by mouth daily.   aspirin EC 81 MG tablet Take 1 tablet (81 mg total) by mouth daily.   B-D SINGLE USE SWABS REGULAR Pads Test BS daily and as needed Dx E11.65   cholecalciferol 25 MCG (1000 UNIT) tablet Commonly known as: VITAMIN D3 Take 1,000 Units by mouth daily.   fenofibrate 145 MG tablet Commonly known as: TRICOR Take 1 tablet (145 mg total) by mouth daily.   fluconazole 100 MG tablet Commonly known as: DIFLUCAN Take 100 mg by mouth daily.   gabapentin 100 MG capsule Commonly known as: NEURONTIN Take 1 capsule (100 mg total) by mouth 3 (three) times daily. What changed:  when to take this additional instructions   levocetirizine 5 MG tablet Commonly known as: XYZAL Take 1 tablet (5 mg total) by mouth every evening.   LORazepam 0.5 MG tablet Commonly known as: ATIVAN Take 1 tablet (0.5 mg total) by mouth 2 (two) times daily as needed. for anxiety   metFORMIN 500 MG 24 hr tablet Commonly known as: GLUCOPHAGE-XR Take 1 tablet (500 mg total) by mouth in the morning and at bedtime.   methocarbamol 500 MG tablet Commonly known as: ROBAXIN   nystatin powder Commonly known as: MYCOSTATIN/NYSTOP APPLY TOPICALLY FOUR TIMES DAILY What changed: See the new instructions.   ondansetron 4 MG disintegrating tablet Commonly known as: ZOFRAN-ODT TAKE 1 TABLET EVERY 8 HOURS AS NEEDED FOR NAUSEA AND  VOMITING   polyethylene glycol 17 g packet Commonly known as: MIRALAX / GLYCOLAX Take 17 g by mouth daily.   predniSONE 50 MG tablet Commonly known as: DELTASONE Take 1 tablet (50 mg total) by mouth daily with breakfast. Start taking on: Nov 06, 2022   Premarin vaginal cream Generic drug: conjugated estrogens Apply 1 application topically daily as needed (irritation).   rosuvastatin 40 MG tablet Commonly known as: CRESTOR TAKE ONE (1) TABLET EACH DAY   traMADol 50 MG tablet Commonly known as: ULTRAM Take 2 tablets (100 mg total) by mouth 2 (two) times daily. TAKE ONE TABLET FOUR TIMES DAILY   True Metrix Air Glucose Meter w/Device Kit Test BS daily and as needed Dx E11.65   True Metrix Blood Glucose Test test strip Generic drug: glucose blood Test BS daily and as needed Dx E11.65   True Metrix Level 1 Low Soln Use with glucose monitor Dx E11.65   TRUEplus Lancets 33G Misc Test BS daily and as needed Dx E11.65               Durable Medical Equipment  (From admission, onward)           Start     Ordered   11/05/22 0945  For home use only DME oxygen  Once       Comments: Needs concentrator for COPD  Question Answer Comment  Length of  Need 6 Months   Mode or (Route) Nasal cannula   Liters per Minute 2   Frequency Continuous (stationary and portable oxygen unit needed)   Oxygen conserving device Yes   Oxygen delivery system Gas      11/05/22 0944            Follow-up Information     [facility] Follow up.   Why: Home oxygen Contact information: [address] [place] [address] [phone]                Discharge Exam: Filed Weights   11/04/22 0506 11/04/22 0610 11/05/22 0457  Weight: 90.1 kg 94.8 kg 89.3 kg   HEENT:  [place]/AT, No thrush, no icterus CV:  RRR, no rub, no S3, no S4 Lung:  bibasilar rales. No wheeze Abd:  soft/+BS, NT Ext:  No edema, no lymphangitis, no synovitis, no  rash   Condition at discharge: stable  The results of significant diagnostics from this hospitalization (including imaging, microbiology, ancillary and laboratory) are listed below for reference.   Imaging Studies: CT CHEST WO CONTRAST  Result Date: 11/03/2022 CLINICAL DATA:  Cough EXAM: CT CHEST WITHOUT CONTRAST TECHNIQUE: Multidetector CT imaging of the chest was performed following the standard protocol without IV contrast. RADIATION DOSE REDUCTION: This exam was performed according to the departmental dose-optimization program which includes automated exposure control, adjustment of the mA and/or kV according to patient size and/or use of iterative reconstruction technique. COMPARISON:  Chest CT dated May 03, 2019 FINDINGS: Cardiovascular: Normal heart size. No pericardial effusion. Normal caliber thoracic aorta with severe atherosclerotic disease. Severe coronary artery calcifications. Mediastinum/Nodes: Esophagus and thyroid are unremarkable. No enlarged lymph nodes seen in the chest. Lungs/Pleura: Central airways are patent. Evaluation of the lungs is somewhat limited due to motion artifact. Mild opacity of the anterior right upper lobe, likely due to scarring or atelectasis. Previously described 4 mm left lower lobe pulmonary nodule is not seen on today's exam. No consolidation, pleural effusion or pneumothorax. Upper Abdomen: Bilateral adrenal gland thickening, unchanged when compared with the prior exam. No acute abnormality. Musculoskeletal: No chest wall mass or suspicious bone lesions identified. IMPRESSION: 1. No evidence of acute airspace opacity, evaluation somewhat limited due to motion artifact. 2. Severe coronary artery calcifications and aortic Atherosclerosis (ICD10-I70.0). Electronically Signed   By: Allegra Lai M.D.   On: 11/03/2022 17:33   ECHOCARDIOGRAM COMPLETE  Result Date: 11/03/2022    ECHOCARDIOGRAM REPORT   Patient Name:   Caitlin Mccarthy Date of Exam: 11/03/2022  Medical Rec #:  [id number]         Height:       70.0 in Accession #:    [id number]        Weight:       198.2 lb Date of Birth:  [birth year]        BSA:          2.079 m Patient Age:    86 years          BP:           144/52 mmHg Patient Gender: F                 HR:           73 bpm. [facility] Procedure: 2D Echo, Color Doppler and Cardiac Doppler Indications:    I50.21 Acute systolic (congestive) heart failure  History:  Patient has prior history of Echocardiogram examinations, most                 recent 01/10/2017. COPD; Risk Factors:Hypertension, Diabetes and                 Dyslipidemia.  Sonographer:    Irving Burton Senior RDCS Referring Phys: [phone] JACOB J STINSON  Sonographer Comments: Technically difficult due to COPD IMPRESSIONS  1. Left ventricular ejection fraction, by estimation, is >75%. The left ventricle has hyperdynamic function. The left ventricle has no regional wall motion abnormalities. Left ventricular diastolic parameters are indeterminate.  2. Right ventricular systolic function is normal. The right ventricular size is normal. Tricuspid regurgitation signal is inadequate for assessing PA pressure.  3. No obvious SAM (systolic anterior motion of mitral valve). The mitral valve is normal in structure. Trivial mitral valve regurgitation. No evidence of mitral stenosis.  4. The aortic valve is normal in structure. Aortic valve regurgitation is not visualized. No aortic stenosis is present.  5. The inferior vena cava is dilated in size with >50% respiratory variability, suggesting right atrial pressure of 8 mmHg. FINDINGS  Left Ventricle: Left ventricular ejection fraction, by estimation, is >75%. The left ventricle has hyperdynamic function. The left ventricle has no regional wall motion abnormalities. The left ventricular internal cavity size was normal in size. There is no left ventricular hypertrophy. Left ventricular diastolic parameters are indeterminate. Right Ventricle: The  right ventricular size is normal. No increase in right ventricular wall thickness. Right ventricular systolic function is normal. Tricuspid regurgitation signal is inadequate for assessing PA pressure. Left Atrium: Left atrial size was normal in size. Right Atrium: Right atrial size was normal in size. Pericardium: There is no evidence of pericardial effusion. Presence of epicardial fat layer. Mitral Valve: No obvious SAM (systolic anterior motion of mitral valve). The mitral valve is normal in structure. Trivial mitral valve regurgitation. No evidence of mitral valve stenosis. Tricuspid Valve: The tricuspid valve is normal in structure. Tricuspid valve regurgitation is not demonstrated. No evidence of tricuspid stenosis. Aortic Valve: The aortic valve is normal in structure. Aortic valve regurgitation is not visualized. No aortic stenosis is present. Pulmonic Valve: The pulmonic valve was normal in structure. Pulmonic valve regurgitation is not visualized. No evidence of pulmonic stenosis. Aorta: The aortic root is normal in size and structure. Venous: The inferior vena cava is dilated in size with greater than 50% respiratory variability, suggesting right atrial pressure of 8 mmHg. IAS/Shunts: No atrial level shunt detected by color flow Doppler.  LEFT VENTRICLE PLAX 2D LVIDd:         4.20 cm LVIDs:         2.70 cm LV PW:         1.00 cm LV IVS:        1.00 cm LVOT diam:     2.10 cm LV SV:         89 LV SV Index:   43 LVOT Area:     3.46 cm  RIGHT VENTRICLE RV S prime:     10.69 cm/s TAPSE (M-mode): 1.8 cm LEFT ATRIUM             Index        RIGHT ATRIUM           Index LA diam:        3.60 cm 1.73 cm/m   RA Area:     16.50 cm LA Vol (A2C):   45.2 ml 21.74 ml/m  RA Volume:   40.50 ml  19.48 ml/m LA Vol (A4C):   53.2 ml 25.59 ml/m LA Biplane Vol: 51.4 ml 24.72 ml/m  AORTIC VALVE LVOT Vmax:   118.33 cm/s LVOT Vmean:  86.667 cm/s LVOT VTI:    0.256 m  AORTA Ao Root diam: 3.20 cm Ao Asc diam:  3.50 cm  SHUNTS  Systemic VTI:  0.26 m Systemic Diam: 2.10 cm Donato Schultz MD Electronically signed by Donato Schultz MD Signature Date/Time: 11/03/2022/11:28:46 AM    Final    DG Chest Port 1 View  Result Date: 11/02/2022 CLINICAL DATA:  Shortness of breath. EXAM: PORTABLE CHEST 1 VIEW COMPARISON:  09/21/2019. FINDINGS: Implantable loop recorder projects over the left heart border. Mild pulmonary edema. No pleural effusion or pneumothorax. New mild left atrial enlargement. Unchanged atherosclerotic calcifications of the aortic arch. Visualized bones and upper abdomen are unremarkable. IMPRESSION: 1. Mild pulmonary edema with new mild left atrial enlargement. 2.  Aortic Atherosclerosis (ICD10-I70.0). Electronically Signed   By: Orvan Falconer M.D.   On: 11/02/2022 15:02    Microbiology: Results for orders placed or performed during the hospital encounter of 11/02/22  Respiratory (~20 pathogens) panel by PCR     Status: Abnormal   Collection Time: 11/02/22  9:40 PM   Specimen: Nasopharyngeal Swab; Respiratory  Result Value Ref Range Status   Adenovirus NOT DETECTED NOT DETECTED Final   Coronavirus 229E NOT DETECTED NOT DETECTED Final    Comment: (NOTE) The Coronavirus on the Respiratory Panel, DOES NOT test for the novel  Coronavirus (2019 nCoV)    Coronavirus HKU1 NOT DETECTED NOT DETECTED Final   Coronavirus NL63 NOT DETECTED NOT DETECTED Final   Coronavirus OC43 DETECTED (A) NOT DETECTED Final   Metapneumovirus NOT DETECTED NOT DETECTED Final   Rhinovirus / Enterovirus NOT DETECTED NOT DETECTED Final   Influenza A NOT DETECTED NOT DETECTED Final   Influenza B NOT DETECTED NOT DETECTED Final   Parainfluenza Virus 1 NOT DETECTED NOT DETECTED Final   Parainfluenza Virus 2 NOT DETECTED NOT DETECTED Final   Parainfluenza Virus 3 NOT DETECTED NOT DETECTED Final   Parainfluenza Virus 4 NOT DETECTED NOT DETECTED Final   Respiratory Syncytial Virus NOT DETECTED NOT DETECTED Final   Bordetella pertussis NOT DETECTED  NOT DETECTED Final   Bordetella Parapertussis NOT DETECTED NOT DETECTED Final   Chlamydophila pneumoniae NOT DETECTED NOT DETECTED Final   Mycoplasma pneumoniae NOT DETECTED NOT DETECTED Final    Comment: Performed at [facility], [address], [place], [place] [address]  SARS Coronavirus 2 by RT PCR (hospital order, performed in [facility] hospital lab) *cepheid single result test* Anterior Nasal Swab     Status: None   Collection Time: 11/02/22  9:40 PM   Specimen: Anterior Nasal Swab  Result Value Ref Range Status   SARS Coronavirus 2 by RT PCR NEGATIVE NEGATIVE Final    Comment: (NOTE) SARS-CoV-2 target nucleic acids are NOT DETECTED.  The SARS-CoV-2 RNA is generally detectable in upper and lower respiratory specimens during the acute phase of infection. The lowest concentration of SARS-CoV-2 viral copies this assay can detect is 250 copies / mL. A negative result does not preclude SARS-CoV-2 infection and should not be used as the sole basis for treatment or other patient management decisions.  A negative result may occur with improper specimen collection / handling, submission of specimen other than nasopharyngeal swab, presence of viral mutation(s) within the areas targeted by this assay, and inadequate number of  viral copies (<250 copies / mL). A negative result must be combined with clinical observations, patient history, and epidemiological information.  Fact Sheet for Patients:   RoadLapTop.co.za  Fact Sheet for Healthcare Providers: http://kim-miller.com/  This test is not yet approved or  cleared by the [place] FDA and has been authorized for detection and/or diagnosis of SARS-CoV-2 by FDA under an Emergency Use Authorization (EUA).  This EUA will remain in effect (meaning this test can be used) for the duration of the COVID-19 declaration under Section 564(b)(1) of the Act, 21 U.S.C. section 360bbb-3(b)(1),  unless the authorization is terminated or revoked sooner.  Performed at [facility], [address] [place], [place] [address]     Labs: CBC: Recent Labs  Lab 11/02/22 1436 11/03/22 0441 11/04/22 0439  WBC 7.7 5.9 12.5*  NEUTROABS 4.7  --   --   HGB 14.6 12.5 12.6  HCT 45.1 39.9 39.2  MCV 97.2 99.5 98.5  PLT 195 172 184   Basic Metabolic Panel: Recent Labs  Lab 11/02/22 1436 11/03/22 0441 11/04/22 0439 11/05/22 0450  NA 133* 134* 135 134*  K 4.1 4.1 4.9 4.5  CL 97* 97* 99 98  CO2 28 27 29 27   GLUCOSE 125* 216* 213* 228*  BUN 21 31* 37* 33*  CREATININE 1.01* 1.67* 1.10* 0.89  CALCIUM 10.7* 10.3 10.8* 11.3*  MG  --   --  1.9  --    Liver Function Tests: Recent Labs  Lab 11/02/22 1436 11/04/22 0439 11/05/22 0450  AST 26 22 27   ALT 19 16 19   ALKPHOS 45 36* 38  BILITOT 0.6 0.4 0.5  PROT 7.8 6.7 7.0  ALBUMIN 4.0 3.3* 3.6   CBG: Recent Labs  Lab 11/04/22 0810 11/04/22 1125 11/04/22 1728 11/04/22 2040 11/05/22 0726  GLUCAP 219* 192* 256* 178* 190*    Discharge time spent: greater than 30 minutes.  Signed: Catarina Hartshorn, MD [facility] 11/05/2022

## 2022-11-06 ENCOUNTER — Telehealth: Payer: Self-pay | Admitting: *Deleted

## 2022-11-06 ENCOUNTER — Encounter: Payer: Self-pay | Admitting: *Deleted

## 2022-11-06 NOTE — Transitions of Care (Post Inpatient/ED Visit) (Signed)
11/06/2022  Name: Caitlin Mccarthy MRN: [id number] DOB: [birth year]  Today's TOC FU Call Status: Today's TOC FU Call Status:: Successful TOC FU Call Competed TOC FU Call Complete Date: 11/06/22  Transition Care Management Follow-up Telephone Call Date of Discharge: 11/05/22 [facility] Type of Discharge: Inpatient Admission Primary Inpatient Discharge Diagnosis:: Acute on chronic respiratory failure with hypoxia How have you been since you were released from the hospital?: Better Any questions or concerns?: No  Items Reviewed: Did you receive and understand the discharge instructions provided?: Yes Medications obtained,verified, and reconciled?: Yes (Medications Reviewed) Any new allergies since your discharge?: No Dietary orders reviewed?: Yes Type of Diet Ordered:: carb modified Do you have support at home?: Yes People in Home: spouse, child(ren), adult Name of Support/Comfort Primary Source: Misty  Medications Reviewed Today: Medications Reviewed Today     Reviewed by Gwenith Daily, RN (Registered Nurse) on 11/06/22 at 1535  Med List Status: <None>   Medication Order Taking? Sig Documenting Provider Last Dose Status Informant  Alcohol Swabs (B-D SINGLE USE SWABS REGULAR) PADS [id number]  Test BS daily and as needed Dx E11.65 Raliegh Ip, DO  Active Self, Pharmacy Records, Child  amLODipine-benazepril (LOTREL) 10-40 MG capsule [id number]  Take 1 capsule by mouth daily. Bennie Pierini, FNP  Active Self, Pharmacy Records, Child  aspirin EC 81 MG tablet [id number]  Take 1 tablet (81 mg total) by mouth daily. Laqueta Linden, MD  Active Self, Pharmacy Records, Child  Blood Glucose Calibration (TRUE METRIX LEVEL 1) Low SOLN [id number]  Use with glucose monitor Dx E11.65 Raliegh Ip, DO  Active Self, Pharmacy Records, Child  Blood Glucose Monitoring Suppl (TRUE METRIX AIR GLUCOSE METER) w/Device KIT [id number]  Test BS daily and as  needed Dx E11.65 Raliegh Ip, DO  Active Self, Pharmacy Records, Child  cholecalciferol (VITAMIN D3) 25 MCG (1000 UNIT) tablet [id number]  Take 1,000 Units by mouth daily. [provider]  Active Self, Pharmacy Records, Child  fenofibrate (TRICOR) 145 MG tablet [id number]  Take 1 tablet (145 mg total) by mouth daily. Bennie Pierini, FNP  Active Self, Pharmacy Records, Child  fluconazole (DIFLUCAN) 100 MG tablet [id number]  Take 100 mg by mouth daily. [provider]  Active Self, Child, Pharmacy Records  gabapentin (NEURONTIN) 100 MG capsule [id number]  Take 1 capsule (100 mg total) by mouth 3 (three) times daily.  Patient taking differently: Take 100 mg by mouth See admin instructions. 2 capsules q pm   Bennie Pierini, FNP  Active Self, Pharmacy Records, Child  glucose blood (TRUE METRIX BLOOD GLUCOSE TEST) test strip [id number]  Test BS daily and as needed Dx E11.65 Raliegh Ip, DO  Active Self, Pharmacy Records, Child  levocetirizine (XYZAL) 5 MG tablet [id number]  Take 1 tablet (5 mg total) by mouth every evening. Gabriel Earing, FNP  Active Self, Pharmacy Records, Child           Med Note Tyrell Antonio   Sat Nov 02, 2022  6:59 PM) Pt has not started yet  LORazepam (ATIVAN) 0.5 MG tablet [id number]  Take 1 tablet (0.5 mg total) by mouth 2 (two) times daily as needed. for anxiety Bennie Pierini, FNP  Active Self, Pharmacy Records, Child  metFORMIN (GLUCOPHAGE-XR) 500 MG 24 hr tablet [id number]  Take 1 tablet (500 mg total) by mouth in the morning and at bedtime. Bennie Pierini, FNP  Active Self, Pharmacy Records, Child  methocarbamol (ROBAXIN) 500 MG tablet [id number]  [provider]  Active Self, Pharmacy Records, Child  nystatin (MYCOSTATIN/NYSTOP) powder [id number]  APPLY TOPICALLY FOUR TIMES DAILY  Patient taking differently: Apply 1 Application topically 4 (four) times daily.   Bennie Pierini, FNP  Active Self,  Pharmacy Records, Child  ondansetron (ZOFRAN-ODT) 4 MG disintegrating tablet [id number]  TAKE 1 TABLET EVERY 8 HOURS AS NEEDED FOR NAUSEA AND VOMITING Daphine Deutscher Mary-Margaret, FNP  Active Self, Pharmacy Records, Child  polyethylene glycol (MIRALAX / GLYCOLAX) 17 g packet [id number]  Take 17 g by mouth daily. [provider]  Active Self, Pharmacy Records, Child  predniSONE (DELTASONE) 50 MG tablet [id number]  Take 1 tablet (50 mg total) by mouth daily with breakfast. Catarina Hartshorn, MD  Active   PREMARIN vaginal cream [id number]  Apply 1 application topically daily as needed (irritation).  [provider]  Active Child, Self, Pharmacy Records  rosuvastatin (CRESTOR) 40 MG tablet [id number]  TAKE ONE (1) TABLET EACH DAY Baus, Mary-Margaret, FNP  Active Self, Pharmacy Records, Child  traMADol (ULTRAM) 50 MG tablet [id number]  Take 2 tablets (100 mg total) by mouth 2 (two) times daily. TAKE ONE TABLET FOUR TIMES DAILY Bennie Pierini, FNP  Active Self, Pharmacy Records, Child  TRUEplus Lancets 33G [place] [id number]  Test BS daily and as needed Dx E11.65 Raliegh Ip, DO  Active Self, Pharmacy Records, Child            Home Care and Equipment/Supplies: Were Home Health Services Ordered?: No Any new equipment or medical supplies ordered?: Yes (Oxygen) Name of Medical supply agency?: [employer] Were you able to get the equipment/medical supplies?: Yes Do you have any questions related to the use of the equipment/supplies?: No  Functional Questionnaire: Do you need assistance with bathing/showering or dressing?: No Do you need assistance with meal preparation?: No Do you need assistance with eating?: No Do you have difficulty maintaining continence: No Do you need assistance with getting out of bed/getting out of a chair/moving?: No Do you have difficulty managing or taking your medications?: No  Follow up appointments reviewed: PCP Follow-up appointment confirmed?: Yes Date of  PCP follow-up appointment?: 11/18/22 Follow-up Provider: Bennie Pierini, FNP Specialist Hospital Follow-up appointment confirmed?: Yes Date of Specialist follow-up appointment?: 11/21/22 Follow-Up Specialty Provider:: Dr Fransico Him Do you need transportation to your follow-up appointment?: No Do you understand care options if your condition(s) worsen?: Yes-patient verbalized understanding  SDOH Interventions Today    Flowsheet Row Most Recent Value  SDOH Interventions   Transportation Interventions Patient Resources (Friends/Family)  Financial Strain Interventions Intervention Not Indicated      TOC Interventions Today    Flowsheet Row Most Recent Value  TOC Interventions   TOC Interventions Discussed/Reviewed TOC Interventions Discussed, TOC Interventions Reviewed, Arranged PCP follow up less than 12 days/Care Guide scheduled      Interventions Today    Flowsheet Row Most Recent Value  Chronic Disease   Chronic disease during today's visit Diabetes  General Interventions   General Interventions Discussed/Reviewed Durable Medical Equipment (DME), Doctor Visits  Doctor Visits Discussed/Reviewed Specialist, Doctor Visits Discussed, Doctor Visits Reviewed, PCP  [Scheduled with Dr Fransico Him on 11/21/22 for hyperparathyroidism]  Durable Medical Equipment (DME) Oxygen, Dan Humphreys, Glucomoter  Education Interventions   Education Provided Provided Education  Provided Verbal Education On When to see the doctor, Blood Sugar Monitoring, Medication  [anticipate blood sugar elevation while on steroids]  Pharmacy Interventions   Pharmacy Dicussed/Reviewed Medications and their functions, Pharmacy Topics Discussed, Pharmacy Topics Reviewed  Safety Interventions  Safety Discussed/Reviewed Safety Discussed, Safety Reviewed, Home Safety, Fall Risk  Home Safety Assistive Devices      Demetrios Loll, BSN, RN-BC RN Care Coordinator [facility]  [facility] Direct Dial:  [phone] Main #: [phone]

## 2022-11-18 ENCOUNTER — Encounter: Payer: Self-pay | Admitting: Nurse Practitioner

## 2022-11-18 ENCOUNTER — Ambulatory Visit (INDEPENDENT_AMBULATORY_CARE_PROVIDER_SITE_OTHER): Payer: Medicare HMO | Admitting: Nurse Practitioner

## 2022-11-18 VITALS — BP 137/67 | HR 57 | Temp 97.4°F | Resp 20 | Ht 70.0 in | Wt 202.0 lb

## 2022-11-18 DIAGNOSIS — J9601 Acute respiratory failure with hypoxia: Secondary | ICD-10-CM

## 2022-11-18 DIAGNOSIS — J9602 Acute respiratory failure with hypercapnia: Secondary | ICD-10-CM | POA: Diagnosis not present

## 2022-11-18 DIAGNOSIS — Z7689 Persons encountering health services in other specified circumstances: Secondary | ICD-10-CM

## 2022-11-18 NOTE — Progress Notes (Signed)
   Subjective:    Patient ID: Caitlin Mccarthy, female    DOB: [birth year], 87 y.o.   MRN: [id number]  HPI Today's visit was for Transitional Care Management.  The patient was discharged from [facility] on 11/05/22 with a primary diagnosis of acute on chronic respiratory failure.   Contact with the patient and/or caregiver, by a clinical staff member, was made on 11/06/22 and was documented as a telephone encounter within the EMR.  Through chart review and discussion with the patient I have determined that management of their condition is of high complexity.    Patient went to hospital c/o weakness. Was found to be in respiratory distress. She was admitted and treated IV antibiotics and IV lasix. She is doing  much better since she went home. Patient was discharged on oxygen but did not bring with her to appointment. Pulse ox at home running around 93% on 2 L of O2 via canula    Review of Systems  Constitutional:  Negative for diaphoresis.  Eyes:  Negative for pain.  Respiratory:  Negative for shortness of breath.   Cardiovascular:  Negative for chest pain, palpitations and leg swelling.  Gastrointestinal:  Negative for abdominal pain.  Endocrine: Negative for polydipsia.  Skin:  Negative for rash.  Neurological:  Negative for dizziness, weakness and headaches.  Hematological:  Does not bruise/bleed easily.  All other systems reviewed and are negative.      Objective:   Physical Exam Vitals reviewed.  Cardiovascular:     Rate and Rhythm: Normal rate and regular rhythm.     Heart sounds: Normal heart sounds.  Pulmonary:     Breath sounds: Rales (fine in bil lower lobes) present.  Skin:    General: Skin is warm.  Neurological:     General: No focal deficit present.     Mental Status: She is alert and oriented to person, place, and time.  Psychiatric:        Mood and Affect: Mood normal.        Behavior: Behavior normal.     BP 137/67   Pulse (!) 57   Temp (!) 97.4 F  (36.3 C) (Temporal)   Resp 20   Ht 5\' 10"  (1.778 m)   Wt 202 lb (91.6 kg)   SpO2 91%   BMI 28.98 kg/m        Assessment & Plan:  Caitlin Mccarthy in today with chief complaint of Transitions Of Care   1. Encounter for support and coordination of transition of care Hospital records reviewed  2. Acute respiratory failure with hypoxia and hypercapnia (HCC) Continue home oxygen Keep check of pulse ox at home Follow up as needed  Orders Placed This Encounter  Procedures   CBC with Differential/Platelet   BMP8+EGFR     The above assessment and management plan was discussed with the patient. The patient verbalized understanding of and has agreed to the management plan. Patient is aware to call the clinic if symptoms persist or worsen. Patient is aware when to return to the clinic for a follow-up visit. Patient educated on when it is appropriate to go to the emergency department.   Mary-Margaret Daphine Deutscher, FNP

## 2022-11-19 LAB — CBC WITH DIFFERENTIAL/PLATELET
Basophils Absolute: 0 10*3/uL (ref 0.0–0.2)
Basos: 1 %
EOS (ABSOLUTE): 0.1 10*3/uL (ref 0.0–0.4)
Eos: 2 %
Hematocrit: 37.3 % (ref 34.0–46.6)
Hemoglobin: 12.8 g/dL (ref 11.1–15.9)
Immature Grans (Abs): 0 10*3/uL (ref 0.0–0.1)
Immature Granulocytes: 0 %
Lymphocytes Absolute: 2.2 10*3/uL (ref 0.7–3.1)
Lymphs: 31 %
MCH: 31.5 pg (ref 26.6–33.0)
MCHC: 34.3 g/dL (ref 31.5–35.7)
MCV: 92 fL (ref 79–97)
Monocytes Absolute: 0.7 10*3/uL (ref 0.1–0.9)
Monocytes: 10 %
Neutrophils Absolute: 4.1 10*3/uL (ref 1.4–7.0)
Neutrophils: 56 %
Platelets: 201 10*3/uL (ref 150–450)
RBC: 4.06 x10E6/uL (ref 3.77–5.28)
RDW: 12.2 % (ref 11.7–15.4)
WBC: 7.2 10*3/uL (ref 3.4–10.8)

## 2022-11-19 LAB — BMP8+EGFR
BUN/Creatinine Ratio: 19 (ref 12–28)
BUN: 13 mg/dL (ref 8–27)
CO2: 23 mmol/L (ref 20–29)
Calcium: 10.6 mg/dL — ABNORMAL HIGH (ref 8.7–10.3)
Chloride: 101 mmol/L (ref 96–106)
Creatinine, Ser: 0.68 mg/dL (ref 0.57–1.00)
Glucose: 112 mg/dL — ABNORMAL HIGH (ref 70–99)
Potassium: 4.3 mmol/L (ref 3.5–5.2)
Sodium: 137 mmol/L (ref 134–144)
eGFR: 86 mL/min/{1.73_m2} (ref >59)

## 2022-11-21 ENCOUNTER — Ambulatory Visit: Payer: Medicare HMO | Admitting: "Endocrinology

## 2022-11-21 ENCOUNTER — Encounter: Payer: Self-pay | Admitting: "Endocrinology

## 2022-11-21 MED ORDER — CHOLECALCIFEROL 50 MCG (2000 UT) PO CAPS: 1.0000 | ORAL_CAPSULE | Freq: Every day | ORAL | 1 refills | Status: DC

## 2022-11-21 NOTE — Progress Notes (Signed)
Endocrinology Consult Note       11/21/2022, 5:00 PM  Caitlin Mccarthy is a 87 y.o.-year-old female, referred by her  Bennie Pierini, FNP  , for evaluation for hypercalcemia/hyperparathyroidism.   Past Medical History:  Diagnosis Date   Anxiety    Depression    GERD (gastroesophageal reflux disease)    History of acute pyelonephritis    last episode 04-13-2015  w/ sepsis   History of kidney stones    History of recurrent UTIs    MULTIPLE   Hyperlipidemia    Hypertension    OA (osteoarthritis)    knees, hips, hands   Osteoporosis    Poor memory    especially when has uti   Renal calculus, left    Stroke (HCC)    Type 2 diabetes mellitus (HCC)    Vitamin D deficiency 05/10/2016    Past Surgical History:  Procedure Laterality Date   CATARACT EXTRACTION W/PHACO  06/06/2011   Procedure: CATARACT EXTRACTION PHACO AND INTRAOCULAR LENS PLACEMENT (IOC);  Surgeon: Gemma Payor;  Location: [facility];  Service: Ophthalmology;  Laterality: Right;  CDE=12.77   CATARACT EXTRACTION W/PHACO  06/27/2011   Procedure: CATARACT EXTRACTION PHACO AND INTRAOCULAR LENS PLACEMENT (IOC);  Surgeon: Gemma Payor;  Location: [facility];  Service: Ophthalmology;  Laterality: Left;  CDE:13.96   CYSTOSCOPY WITH URETEROSCOPY AND STENT PLACEMENT Right 04/27/2015   Procedure: CYSTOSCOPY WITH RIGHT URETEROSCOPY, BASKET REMOVAL OF STONE, REMOVAL OF RIGHT NEPHROSTOMY TUBE;  Surgeon: Bjorn Pippin, MD;  Location: [facility] ;  Service: Urology;  Laterality: Right;   CYSTOSCOPY/URETEROSCOPY/HOLMIUM LASER/STENT PLACEMENT Left 12/24/2016   Procedure: LEFT URETEROSCOPY WITH HOLMIUM LASER AND STENT PLACEMENT;  Surgeon: Bjorn Pippin, MD;  Location: [facility];  Service: Urology;  Laterality: Left;   EXTRACORPOREAL SHOCK WAVE LITHOTRIPSY  left 04-01-2016;  1980's   FRACTURE SURGERY     HIP ARTHROPLASTY     HOLMIUM LASER APPLICATION Right  04/27/2015   Procedure: HOLMIUM LASER APPLICATION;  Surgeon: Bjorn Pippin, MD;  Location: [facility];  Service: Urology;  Laterality: Right;   KNEE ARTHROPLASTY     KNEE ARTHROSCOPY Bilateral right 2005//  left ?   LOOP RECORDER INSERTION N/A 04/01/2017   Procedure: LOOP RECORDER INSERTION;  Surgeon: Hillis Range, MD;  Location: [facility];  Service: Cardiovascular;  Laterality: N/A;   TOTAL HIP ARTHROPLASTY  10/28/2011   Procedure: TOTAL HIP ARTHROPLASTY;  Surgeon: Loanne Drilling, MD;  Location: [facility];  Service: Orthopedics;  Laterality: Right;   TOTAL KNEE ARTHROPLASTY Bilateral left 03-09-2007//  right 2006   TRANSTHORACIC ECHOCARDIOGRAM  10/06/2006   normal echo,  ef 55-60%   TUBAL LIGATION      Social History   Tobacco Use   Smoking status: Former    Packs/day: 0.25    Years: 42.00    Additional pack years: 0.00    Total pack years: 10.50    Types: Cigarettes    Quit date: 11/01/2022    Years since quitting: 0.0   Smokeless tobacco: Never   Tobacco comments:    1 pack last 4 days   Vaping Use   Vaping  Use: Never used  Substance Use Topics   Alcohol use: No   Drug use: No    Family History  Problem Relation Age of Onset   Kidney disease Mother    Diabetes Mother    Congestive Heart Failure Father    Heart failure Father    Heart disease Brother    Alcohol abuse Brother    Anesthesia problems Neg Hx    Hypotension Neg Hx    Malignant hyperthermia Neg Hx    Pseudochol deficiency Neg Hx     Outpatient Encounter Medications as of 11/21/2022  Medication Sig   Cholecalciferol 50 MCG (2000 UT) CAPS Take 1 capsule (2,000 Units total) by mouth daily with breakfast.   Alcohol Swabs (B-D SINGLE USE SWABS REGULAR) PADS Test BS daily and as needed Dx E11.65   amLODipine-benazepril (LOTREL) 10-40 MG capsule Take 1 capsule by mouth daily.   aspirin EC 81 MG tablet Take 1 tablet (81 mg total) by mouth daily.   Blood Glucose Calibration (TRUE METRIX LEVEL  1) Low SOLN Use with glucose monitor Dx E11.65   Blood Glucose Monitoring Suppl (TRUE METRIX AIR GLUCOSE METER) w/Device KIT Test BS daily and as needed Dx E11.65   fenofibrate (TRICOR) 145 MG tablet Take 1 tablet (145 mg total) by mouth daily.   gabapentin (NEURONTIN) 100 MG capsule Take 1 capsule (100 mg total) by mouth 3 (three) times daily. (Patient not taking: Reported on 11/21/2022)   glucose blood (TRUE METRIX BLOOD GLUCOSE TEST) test strip Test BS daily and as needed Dx E11.65   LORazepam (ATIVAN) 0.5 MG tablet Take 1 tablet (0.5 mg total) by mouth 2 (two) times daily as needed. for anxiety   metFORMIN (GLUCOPHAGE-XR) 500 MG 24 hr tablet Take 1 tablet (500 mg total) by mouth in the morning and at bedtime.   methocarbamol (ROBAXIN) 500 MG tablet    nystatin (MYCOSTATIN/NYSTOP) powder APPLY TOPICALLY FOUR TIMES DAILY (Patient taking differently: Apply 1 Application topically 4 (four) times daily.)   ondansetron (ZOFRAN-ODT) 4 MG disintegrating tablet TAKE 1 TABLET EVERY 8 HOURS AS NEEDED FOR NAUSEA AND VOMITING   OXYGEN Inhale 2 L into the lungs continuous as needed.   polyethylene glycol (MIRALAX / GLYCOLAX) 17 g packet Take 17 g by mouth daily.   PREMARIN vaginal cream Apply 1 application topically daily as needed (irritation).    rosuvastatin (CRESTOR) 40 MG tablet TAKE ONE (1) TABLET EACH DAY   traMADol (ULTRAM) 50 MG tablet Take 2 tablets (100 mg total) by mouth 2 (two) times daily. TAKE ONE TABLET FOUR TIMES DAILY   TRUEplus Lancets 33G MISC Test BS daily and as needed Dx E11.65   [DISCONTINUED] cholecalciferol (VITAMIN D3) 25 MCG (1000 UNIT) tablet Take 1,000 Units by mouth daily.   [DISCONTINUED] fluconazole (DIFLUCAN) 100 MG tablet Take 100 mg by mouth daily.   [DISCONTINUED] levocetirizine (XYZAL) 5 MG tablet Take 1 tablet (5 mg total) by mouth every evening.   No facility-administered encounter medications on file as of 11/21/2022.    No Active Allergies   HPI  Caitlin Mccarthy was diagnosed with hypercalcemia for at least a year.  She knew about hypercalcemia even before she was recently hospitalized when she was found to have calcium level of 11.3.  She is accompanied by her daughter to clinic. -She does not recall if she was ever offered treatment for hyperparathyroidism.    Patient has no previously known history  pituitary, adrenal dysfunctions; no family history of such  dysfunctions.  -Review of herreferral package of most recent labs reveals calcium of 10.6 with the corresponding PTH of 63 on Nov 03, 2022.  -She does not have recent bone density to review.  She is not on osteoporosis treatment.  No prior history of fragility fractures or falls.  She does have remote past history of nephrolithiasis on multiple occasions.   No history of CKD. Last BUN/Cr: 13/0.68.  she is not on HCTZ or other thiazide therapy.  She does have vitamin D deficiency, not on vitamin D supplement  she is not on calcium supplements,  she eats dairy and green, leafy, vegetables on average amounts. -She has extensive history of osteoarthritis requiring bilateral knee joint replacements.  she does not have a family history of hypercalcemia, pituitary tumors, thyroid cancer, or osteoporosis.   I reviewed her chart and she also has a history of type 2 diabetes on metformin 500 mg p.o. twice daily, hypertension on amlodipine/benazepril.  Her most recent A1c was 6.7%. She ambulates with a walker at baseline. -She denies any history of malignancy.  ROS:  Constitutional: + Mildly fluctuating body weight, with recent gain of 4 pounds.   + no fatigue, no subjective hyperthermia, no subjective hypothermia Eyes: no blurry vision, no xerophthalmia ENT: no sore throat, no nodules palpated in throat, no dysphagia/odynophagia, no hoarseness Cardiovascular: no Chest Pain, no Shortness of Breath, no palpitations, no leg swelling Respiratory: no cough, no shortness of breath   Gastrointestinal: no Nausea/Vomiting/Diarhhea Musculoskeletal: no muscle/joint aches, + ambulates with a walker. Skin: no rashes Neurological: no tremors, no numbness, no tingling, no dizziness Psychiatric: no depression, no anxiety  PE: BP (!) 142/74   Pulse 60   Ht 5\' 10"  (1.778 m)   Wt 200 lb 6.4 oz (90.9 kg)   BMI 28.75 kg/m , Body mass index is 28.75 kg/m. Wt Readings from Last 3 Encounters:  11/21/22 200 lb 6.4 oz (90.9 kg)  11/18/22 202 lb (91.6 kg)  11/05/22 196 lb 13.9 oz (89.3 kg)    Constitutional:  + BMI of 28.75,  not in acute distress, normal state of mind Eyes: PERRLA, EOMI, no exophthalmos ENT: moist mucous membranes, no gross thyromegaly, no gross cervical lymphadenopathy Cardiovascular: normal precordial activity, Regular Rate and Rhythm, no Murmur/Rubs/Gallops Respiratory:  adequate breathing efforts, no gross chest deformity, Clear to auscultation bilaterally Gastrointestinal: abdomen soft, Non -tender, No distension, Bowel Sounds present Musculoskeletal: no gross deformities, strength intact in all four extremities Skin: moist, warm, no rashes Neurological: no tremor with outstretched hands, Deep tendon reflexes normal in bilateral lower extremities.     CMP ( most recent) CMP     Component Value Date/Time   NA 137 11/18/2022 1209   NA 137 08/15/2015 1450   K 4.3 11/18/2022 1209   K 3.4 (L) 08/15/2015 1450   CL 101 11/18/2022 1209   CO2 23 11/18/2022 1209   CO2 26 08/15/2015 1450   GLUCOSE 112 (H) 11/18/2022 1209   GLUCOSE 228 (H) 11/05/2022 0450   GLUCOSE 128 08/15/2015 1450   BUN 13 11/18/2022 1209   BUN 18.8 08/15/2015 1450   CREATININE 0.68 11/18/2022 1209   CREATININE 0.9 08/15/2015 1450   CALCIUM 10.6 (H) 11/18/2022 1209   CALCIUM 11.5 (H) 08/15/2015 1450   PROT 7.0 11/05/2022 0450   PROT 7.7 08/30/2022 1137   PROT 8.6 (H) 08/15/2015 1450   ALBUMIN 3.6 11/05/2022 0450   ALBUMIN 4.5 08/30/2022 1137   ALBUMIN 4.0 08/15/2015 1450   AST  27  11/05/2022 0450   AST 27 08/15/2015 1450   ALT 19 11/05/2022 0450   ALT 27 08/15/2015 1450   ALKPHOS 38 11/05/2022 0450   ALKPHOS 66 08/15/2015 1450   BILITOT 0.5 11/05/2022 0450   BILITOT 0.3 08/30/2022 1137   BILITOT 0.47 08/15/2015 1450   GFRNONAA >60 11/05/2022 0450   GFRNONAA 88 10/02/2012 1301   GFRAA 57 (L) 08/10/2020 1208   GFRAA >89 10/02/2012 1301     Diabetic Labs (most recent): Lab Results  Component Value Date   HGBA1C 6.7 (H) 11/03/2022   HGBA1C 7.1 (H) 08/30/2022   HGBA1C 6.5 (H) 02/26/2022     Lipid Panel ( most recent) Lipid Panel     Component Value Date/Time   CHOL 198 08/30/2022 1137   CHOL 158 10/02/2012 1301   TRIG 296 (H) 08/30/2022 1137   TRIG 229 (H) 11/24/2014 1126   TRIG 126 10/02/2012 1301   HDL 45 08/30/2022 1137   HDL 42 11/24/2014 1126   HDL 41 10/02/2012 1301   CHOLHDL 4.4 08/30/2022 1137   CHOLHDL 7.2 01/10/2017 0405   VLDL UNABLE TO CALCULATE IF TRIGLYCERIDE OVER 400 mg/dL 04/54/0981 1914   LDLCALC 103 (H) 08/30/2022 1137   LDLCALC 100 (H) 11/29/2013 1223   LDLCALC 92 10/02/2012 1301   LABVLDL 50 (H) 08/30/2022 1137      Lab Results  Component Value Date   TSH 0.510 11/03/2022   FREET4 0.74 11/03/2022     Assessment: 1. Hypercalcemia / Hyperparathyroidism  Plan: Patient has had several instances of elevated calcium, with the highest level being at 12.1 in August 2023, has been 11.3 mg/dL, last months.  Her most recent labs showing 10.6 associated with PTH of 63.  This presentation is still consistent with primary hyperparathyroidism.  She does have vitamin D deficiency, advised to resume vitamin D3 2000 units daily. -Apparent complications seems to be osteoporosis,  her  remote past history of nephrolithiasis may not be related to her hypercalcemia.  She also checks for history of osteoporosis, not on treatment. -She has ongoing skeletal pain due to osteoarthritis.  - I discussed with the patient about the physiology of  calcium and parathyroid hormone, and possible  effects of  increased PTH/ Calcium , including kidney stones, cardiac dysrhythmias, osteoporosis, abdominal pain, etc.   - The work up so far is not sufficient to reach a conclusion for definitive therapy.  she  needs more studies to confirm and classify the parathyroid dysfunction she may have. I will proceed to obtain  repeat intact PTH/calcium, PTH RP, serum magnesium, serum phosphorus.  It is also essential to obtain 24-hour urine calcium/creatinine to rule out the rare but important cause of mild elevation in calcium and PTH- FHH ( Familial Hypocalciuric Hypercalcemia), which may not require any active intervention.  - I will request for her next DEXA scan to include the distal  33% of  radius for evaluation of cortical bone, which is predominantly affected by hyperparathyroidism.   She is advised to maintain close follow-up with her PMD.  - Time spent with the patient: 45 minutes, of which >50% was spent in obtaining information about her symptoms, reviewing her previous labs, evaluations, and treatments, counseling her about her hypercalcemia, and developing a plan to confirm the diagnosis and long term treatment as necessary.  Please refer to " Patient Self Inventory" in the Media  tab for reviewed elements of pertinent patient history.  Caitlin Mccarthy participated in the discussions, expressed understanding, and voiced  agreement with the above plans.  All questions were answered to her satisfaction. she is encouraged to contact clinic should she have any questions or concerns prior to her return visit.  - Return in about 2 weeks (around 12/05/2022) for DXA Scan B4 NV, 24 Hr Urine Ca & Cr, Labs Today- Non-Fasting Ok.   Marquis Lunch, MD [facility] [address] [place], [place] [address] Phone: [phone]  Fax: [phone]    This note was partially dictated with voice recognition  software. Similar sounding words can be transcribed inadequately or may not  be corrected upon review.  11/21/2022, 5:00 PM

## 2022-11-22 LAB — PTH, INTACT AND CALCIUM: Calcium: 10.9 mg/dL — ABNORMAL HIGH (ref 8.7–10.3)

## 2022-11-22 LAB — PTH-RELATED PEPTIDE

## 2022-11-26 ENCOUNTER — Encounter (HOSPITAL_COMMUNITY): Payer: Self-pay | Admitting: Emergency Medicine

## 2022-11-26 ENCOUNTER — Emergency Department (HOSPITAL_COMMUNITY): Payer: Medicare HMO

## 2022-11-26 ENCOUNTER — Other Ambulatory Visit: Payer: Self-pay

## 2022-11-26 ENCOUNTER — Inpatient Hospital Stay (HOSPITAL_COMMUNITY)
Admission: EM | Admit: 2022-11-26 | Discharge: 2022-12-03 | DRG: 481 | Disposition: A | Discharge status: Skilled Nursing Facility | Payer: Medicare HMO | Attending: Internal Medicine | Admitting: Internal Medicine

## 2022-11-26 DIAGNOSIS — E1165 Type 2 diabetes mellitus with hyperglycemia: Secondary | ICD-10-CM | POA: Diagnosis present

## 2022-11-26 DIAGNOSIS — Z66 Do not resuscitate: Secondary | ICD-10-CM | POA: Diagnosis not present

## 2022-11-26 DIAGNOSIS — E1169 Type 2 diabetes mellitus with other specified complication: Secondary | ICD-10-CM | POA: Diagnosis present

## 2022-11-26 DIAGNOSIS — R531 Weakness: Secondary | ICD-10-CM | POA: Diagnosis not present

## 2022-11-26 DIAGNOSIS — J9611 Chronic respiratory failure with hypoxia: Secondary | ICD-10-CM | POA: Diagnosis not present

## 2022-11-26 DIAGNOSIS — T426X5A Adverse effect of other antiepileptic and sedative-hypnotic drugs, initial encounter: Secondary | ICD-10-CM | POA: Diagnosis not present

## 2022-11-26 DIAGNOSIS — E669 Obesity, unspecified: Secondary | ICD-10-CM | POA: Diagnosis present

## 2022-11-26 DIAGNOSIS — E119 Type 2 diabetes mellitus without complications: Secondary | ICD-10-CM | POA: Diagnosis not present

## 2022-11-26 DIAGNOSIS — Y92239 Unspecified place in hospital as the place of occurrence of the external cause: Secondary | ICD-10-CM | POA: Diagnosis not present

## 2022-11-26 DIAGNOSIS — Z8616 Personal history of COVID-19: Secondary | ICD-10-CM | POA: Diagnosis not present

## 2022-11-26 DIAGNOSIS — M6281 Muscle weakness (generalized): Secondary | ICD-10-CM | POA: Diagnosis not present

## 2022-11-26 DIAGNOSIS — S7291XA Unspecified fracture of right femur, initial encounter for closed fracture: Secondary | ICD-10-CM | POA: Diagnosis not present

## 2022-11-26 DIAGNOSIS — S72401A Unspecified fracture of lower end of right femur, initial encounter for closed fracture: Principal | ICD-10-CM

## 2022-11-26 DIAGNOSIS — F32A Depression, unspecified: Secondary | ICD-10-CM | POA: Diagnosis present

## 2022-11-26 DIAGNOSIS — J449 Chronic obstructive pulmonary disease, unspecified: Secondary | ICD-10-CM | POA: Diagnosis not present

## 2022-11-26 DIAGNOSIS — Z743 Need for continuous supervision: Secondary | ICD-10-CM | POA: Diagnosis not present

## 2022-11-26 DIAGNOSIS — S80911A Unspecified superficial injury of right knee, initial encounter: Secondary | ICD-10-CM | POA: Diagnosis not present

## 2022-11-26 DIAGNOSIS — W1830XA Fall on same level, unspecified, initial encounter: Secondary | ICD-10-CM | POA: Diagnosis present

## 2022-11-26 DIAGNOSIS — F411 Generalized anxiety disorder: Secondary | ICD-10-CM

## 2022-11-26 DIAGNOSIS — K219 Gastro-esophageal reflux disease without esophagitis: Secondary | ICD-10-CM | POA: Diagnosis present

## 2022-11-26 DIAGNOSIS — R41841 Cognitive communication deficit: Secondary | ICD-10-CM | POA: Diagnosis not present

## 2022-11-26 DIAGNOSIS — Z9181 History of falling: Secondary | ICD-10-CM | POA: Diagnosis not present

## 2022-11-26 DIAGNOSIS — S7291XD Unspecified fracture of right femur, subsequent encounter for closed fracture with routine healing: Secondary | ICD-10-CM | POA: Diagnosis not present

## 2022-11-26 DIAGNOSIS — M549 Dorsalgia, unspecified: Secondary | ICD-10-CM | POA: Diagnosis not present

## 2022-11-26 DIAGNOSIS — Z833 Family history of diabetes mellitus: Secondary | ICD-10-CM

## 2022-11-26 DIAGNOSIS — Z96653 Presence of artificial knee joint, bilateral: Secondary | ICD-10-CM | POA: Diagnosis present

## 2022-11-26 DIAGNOSIS — Z9842 Cataract extraction status, left eye: Secondary | ICD-10-CM

## 2022-11-26 DIAGNOSIS — Z87442 Personal history of urinary calculi: Secondary | ICD-10-CM

## 2022-11-26 DIAGNOSIS — I10 Essential (primary) hypertension: Secondary | ICD-10-CM | POA: Diagnosis present

## 2022-11-26 DIAGNOSIS — Y92009 Unspecified place in unspecified non-institutional (private) residence as the place of occurrence of the external cause: Secondary | ICD-10-CM | POA: Diagnosis not present

## 2022-11-26 DIAGNOSIS — E785 Hyperlipidemia, unspecified: Secondary | ICD-10-CM | POA: Diagnosis present

## 2022-11-26 DIAGNOSIS — Z811 Family history of alcohol abuse and dependence: Secondary | ICD-10-CM

## 2022-11-26 DIAGNOSIS — R2689 Other abnormalities of gait and mobility: Secondary | ICD-10-CM | POA: Diagnosis not present

## 2022-11-26 DIAGNOSIS — N3020 Other chronic cystitis without hematuria: Secondary | ICD-10-CM | POA: Diagnosis not present

## 2022-11-26 DIAGNOSIS — Z6831 Body mass index (BMI) 31.0-31.9, adult: Secondary | ICD-10-CM

## 2022-11-26 DIAGNOSIS — Z87891 Personal history of nicotine dependence: Secondary | ICD-10-CM

## 2022-11-26 DIAGNOSIS — Z9841 Cataract extraction status, right eye: Secondary | ICD-10-CM

## 2022-11-26 DIAGNOSIS — M9711XA Periprosthetic fracture around internal prosthetic right knee joint, initial encounter: Secondary | ICD-10-CM | POA: Diagnosis present

## 2022-11-26 DIAGNOSIS — W19XXXA Unspecified fall, initial encounter: Secondary | ICD-10-CM

## 2022-11-26 DIAGNOSIS — S72451A Displaced supracondylar fracture without intracondylar extension of lower end of right femur, initial encounter for closed fracture: Principal | ICD-10-CM | POA: Diagnosis present

## 2022-11-26 DIAGNOSIS — S72301A Unspecified fracture of shaft of right femur, initial encounter for closed fracture: Secondary | ICD-10-CM | POA: Diagnosis not present

## 2022-11-26 DIAGNOSIS — Z9981 Dependence on supplemental oxygen: Secondary | ICD-10-CM

## 2022-11-26 DIAGNOSIS — Z87440 Personal history of urinary (tract) infections: Secondary | ICD-10-CM

## 2022-11-26 DIAGNOSIS — Z7984 Long term (current) use of oral hypoglycemic drugs: Secondary | ICD-10-CM

## 2022-11-26 DIAGNOSIS — Z751 Person awaiting admission to adequate facility elsewhere: Secondary | ICD-10-CM

## 2022-11-26 DIAGNOSIS — Z79899 Other long term (current) drug therapy: Secondary | ICD-10-CM

## 2022-11-26 DIAGNOSIS — Z841 Family history of disorders of kidney and ureter: Secondary | ICD-10-CM

## 2022-11-26 DIAGNOSIS — R000 Tachycardia, unspecified: Secondary | ICD-10-CM | POA: Diagnosis not present

## 2022-11-26 DIAGNOSIS — R400 Somnolence: Secondary | ICD-10-CM | POA: Diagnosis not present

## 2022-11-26 DIAGNOSIS — M160 Bilateral primary osteoarthritis of hip: Secondary | ICD-10-CM

## 2022-11-26 DIAGNOSIS — Z961 Presence of intraocular lens: Secondary | ICD-10-CM | POA: Diagnosis present

## 2022-11-26 DIAGNOSIS — Z96641 Presence of right artificial hip joint: Secondary | ICD-10-CM | POA: Diagnosis present

## 2022-11-26 DIAGNOSIS — Z7982 Long term (current) use of aspirin: Secondary | ICD-10-CM

## 2022-11-26 DIAGNOSIS — D62 Acute posthemorrhagic anemia: Secondary | ICD-10-CM | POA: Diagnosis not present

## 2022-11-26 DIAGNOSIS — Z043 Encounter for examination and observation following other accident: Secondary | ICD-10-CM | POA: Diagnosis not present

## 2022-11-26 DIAGNOSIS — S79929A Unspecified injury of unspecified thigh, initial encounter: Secondary | ICD-10-CM | POA: Diagnosis not present

## 2022-11-26 DIAGNOSIS — Z8249 Family history of ischemic heart disease and other diseases of the circulatory system: Secondary | ICD-10-CM

## 2022-11-26 DIAGNOSIS — M62521 Muscle wasting and atrophy, not elsewhere classified, right upper arm: Secondary | ICD-10-CM | POA: Diagnosis not present

## 2022-11-26 DIAGNOSIS — M62522 Muscle wasting and atrophy, not elsewhere classified, left upper arm: Secondary | ICD-10-CM | POA: Diagnosis not present

## 2022-11-26 DIAGNOSIS — J410 Simple chronic bronchitis: Secondary | ICD-10-CM | POA: Diagnosis not present

## 2022-11-26 DIAGNOSIS — Z8673 Personal history of transient ischemic attack (TIA), and cerebral infarction without residual deficits: Secondary | ICD-10-CM

## 2022-11-26 LAB — CBC WITH DIFFERENTIAL/PLATELET
Abs Immature Granulocytes: 0.03 10*3/uL (ref 0.00–0.07)
Basophils Absolute: 0 10*3/uL (ref 0.0–0.1)
Basophils Relative: 0 %
Eosinophils Absolute: 0 10*3/uL (ref 0.0–0.5)
Eosinophils Relative: 0 %
HCT: 40.1 % (ref 36.0–46.0)
Hemoglobin: 13 g/dL (ref 12.0–15.0)
Immature Granulocytes: 0 %
Lymphocytes Relative: 17 %
Lymphs Abs: 1.3 10*3/uL (ref 0.7–4.0)
MCH: 31.3 pg (ref 26.0–34.0)
MCHC: 32.4 g/dL (ref 30.0–36.0)
MCV: 96.4 fL (ref 80.0–100.0)
Monocytes Absolute: 0.4 10*3/uL (ref 0.1–1.0)
Monocytes Relative: 5 %
Neutro Abs: 5.8 10*3/uL (ref 1.7–7.7)
Neutrophils Relative %: 78 %
Platelets: 273 10*3/uL (ref 150–400)
RBC: 4.16 MIL/uL (ref 3.87–5.11)
RDW: 13.1 % (ref 11.5–15.5)
WBC: 7.5 10*3/uL (ref 4.0–10.5)
nRBC: 0 % (ref 0.0–0.2)

## 2022-11-26 LAB — BASIC METABOLIC PANEL
Anion gap: 13 (ref 5–15)
BUN: 14 mg/dL (ref 8–23)
CO2: 22 mmol/L (ref 22–32)
Calcium: 11 mg/dL — ABNORMAL HIGH (ref 8.9–10.3)
Chloride: 97 mmol/L — ABNORMAL LOW (ref 98–111)
Creatinine, Ser: 0.83 mg/dL (ref 0.44–1.00)
GFR, Estimated: >60 mL/min (ref >60)
Glucose, Bld: 224 mg/dL — ABNORMAL HIGH (ref 70–99)
Potassium: 3.7 mmol/L (ref 3.5–5.1)
Sodium: 132 mmol/L — ABNORMAL LOW (ref 135–145)

## 2022-11-26 LAB — PROTIME-INR
INR: 1 (ref 0.8–1.2)
Prothrombin Time: 12.9 seconds (ref 11.4–15.2)

## 2022-11-26 MED ORDER — MORPHINE SULFATE (PF) 2 MG/ML IV SOLN
INTRAVENOUS | Status: AC
  Filled 2022-11-26: qty 1

## 2022-11-26 MED ORDER — MORPHINE SULFATE (PF) 4 MG/ML IV SOLN
4.0000 mg | Freq: Once | INTRAVENOUS | Status: AC
  Administered 2022-11-26: 4 mg via INTRAVENOUS
  Filled 2022-11-26: qty 1

## 2022-11-26 MED ORDER — MORPHINE SULFATE (PF) 2 MG/ML IV SOLN
2.0000 mg | INTRAVENOUS | Status: DC | PRN
  Administered 2022-11-26 – 2022-11-29 (×5): 2 mg via INTRAVENOUS
  Filled 2022-11-26 (×5): qty 1

## 2022-11-26 NOTE — ED Provider Notes (Signed)
EMERGENCY DEPARTMENT AT [facility] Provider Note   CSN: [id number] Arrival date & time: 11/26/22  1918     History {Add pertinent medical, surgical, social history, OB history to HPI:1} Chief Complaint  Patient presents with   Fall   Knee Injury    Caitlin Mccarthy is a 87 y.o. female.  She was recently admitted to the hospital earlier this month she was discharged on oxygen increased chest congestion shortness of breath and ultimately tested positive for COVID.  She was discharged home on oxygen.  She has been generally weak.  Today she was having difficulty getting off of her commode and lost her balance and fell to the floor injuring her left leg.  She denies loss of consciousness no neck or back pain no head injury.  He is not on blood thinners.  She denies other complaints.  She rates her pain as 9 out of 10 and worse with any type of movement.  No numbness or weakness.  The history is provided by the patient and a relative.  Fall This is a new problem. The problem has not changed since onset.Pertinent negatives include no chest pain, no abdominal pain, no headaches and no shortness of breath. Associated symptoms comments: Right knee pain. The symptoms are aggravated by bending and twisting. Nothing relieves the symptoms. She has tried nothing for the symptoms. The treatment provided no relief.       Home Medications Prior to Admission medications   Medication Sig Start Date End Date Taking? Authorizing Provider  Alcohol Swabs (B-D SINGLE USE SWABS REGULAR) PADS Test BS daily and as needed Dx E11.65 01/05/20   Delynn Flavin M, DO  amLODipine-benazepril (LOTREL) 10-40 MG capsule Take 1 capsule by mouth daily. 08/30/22   Daphine Deutscher Mary-Margaret, FNP  aspirin EC 81 MG tablet Take 1 tablet (81 mg total) by mouth daily. 04/29/18   Laqueta Linden, MD  Blood Glucose Calibration (TRUE METRIX LEVEL 1) Low SOLN Use with glucose monitor Dx E11.65 01/05/20    Delynn Flavin M, DO  Blood Glucose Monitoring Suppl (TRUE METRIX AIR GLUCOSE METER) w/Device KIT Test BS daily and as needed Dx E11.65 01/05/20   Raliegh Ip, DO  Cholecalciferol 50 MCG (2000 UT) CAPS Take 1 capsule (2,000 Units total) by mouth daily with breakfast. 11/21/22   Nida, Denman George, MD  fenofibrate (TRICOR) 145 MG tablet Take 1 tablet (145 mg total) by mouth daily. 08/30/22   Daphine Deutscher, Mary-Margaret, FNP  gabapentin (NEURONTIN) 100 MG capsule Take 1 capsule (100 mg total) by mouth 3 (three) times daily. Patient not taking: Reported on 11/21/2022 08/30/22   Bennie Pierini, FNP  glucose blood (TRUE METRIX BLOOD GLUCOSE TEST) test strip Test BS daily and as needed Dx E11.65 01/05/20   Delynn Flavin M, DO  LORazepam (ATIVAN) 0.5 MG tablet Take 1 tablet (0.5 mg total) by mouth 2 (two) times daily as needed. for anxiety 08/30/22   Bennie Pierini, FNP  metFORMIN (GLUCOPHAGE-XR) 500 MG 24 hr tablet Take 1 tablet (500 mg total) by mouth in the morning and at bedtime. 08/30/22   Bennie Pierini, FNP  methocarbamol (ROBAXIN) 500 MG tablet  12/06/20   [provider]  nystatin (MYCOSTATIN/NYSTOP) powder APPLY TOPICALLY FOUR TIMES DAILY Patient taking differently: Apply 1 Application topically 4 (four) times daily. 05/13/22   Daphine Deutscher, Mary-Margaret, FNP  ondansetron (ZOFRAN-ODT) 4 MG disintegrating tablet TAKE 1 TABLET EVERY 8 HOURS AS NEEDED FOR NAUSEA AND VOMITING 08/13/21   Daphine Deutscher,  Mary-Margaret, FNP  OXYGEN Inhale 2 L into the lungs continuous as needed.    [provider]  polyethylene glycol (MIRALAX / GLYCOLAX) 17 g packet Take 17 g by mouth daily.    [provider]  PREMARIN vaginal cream Apply 1 application topically daily as needed (irritation).  12/06/16   [provider]  rosuvastatin (CRESTOR) 40 MG tablet TAKE ONE (1) TABLET EACH DAY 08/30/22   Daphine Deutscher, Mary-Margaret, FNP  traMADol (ULTRAM) 50 MG tablet Take 2 tablets (100 mg total)  by mouth 2 (two) times daily. TAKE ONE TABLET FOUR TIMES DAILY 08/30/22   Bennie Pierini, FNP  TRUEplus Lancets 33G MISC Test BS daily and as needed Dx E11.65 01/05/20   Raliegh Ip, DO      Allergies    Patient has no active allergies.    Review of Systems   Review of Systems  Constitutional:  Negative for fever.  Eyes:  Negative for visual disturbance.  Respiratory:  Negative for shortness of breath.   Cardiovascular:  Negative for chest pain.  Gastrointestinal:  Negative for abdominal pain.  Musculoskeletal:  Negative for back pain and neck pain.  Neurological:  Negative for headaches.    Physical Exam Updated Vital Signs There were no vitals taken for this visit. Physical Exam Vitals and nursing note reviewed.  Constitutional:      General: She is not in acute distress.    Appearance: Normal appearance. She is well-developed.  HENT:     Head: Normocephalic and atraumatic.  Eyes:     Conjunctiva/sclera: Conjunctivae normal.  Cardiovascular:     Rate and Rhythm: Normal rate and regular rhythm.     Heart sounds: No murmur heard. Pulmonary:     Effort: Pulmonary effort is normal. No respiratory distress.     Breath sounds: Normal breath sounds.  Abdominal:     Palpations: Abdomen is soft.     Tenderness: There is no abdominal tenderness. There is no guarding or rebound.  Musculoskeletal:        General: Tenderness and signs of injury present.     Cervical back: Neck supple.     Comments: Right lower extremity she is diffusely tender about her right knee.  There is a well-healed surgical scar about her knee.  Her hip and ankle and foot are nontender.  Distal pulses motor and sensation intact.  Left lower extremity and bilateral upper extremities full range of motion without any pain or limitations.  Skin:    General: Skin is warm and dry.     Capillary Refill: Capillary refill takes less than 2 seconds.  Neurological:     General: No focal deficit present.      Mental Status: She is alert.     Sensory: No sensory deficit.     Motor: No weakness.     ED Results / Procedures / Treatments   Labs (all labs ordered are listed, but only abnormal results are displayed) Labs Reviewed  BASIC METABOLIC PANEL  CBC WITH DIFFERENTIAL/PLATELET  PROTIME-INR    EKG None  Radiology No results found.  Procedures Procedures  {Document cardiac monitor, telemetry assessment procedure when appropriate:1}  Medications Ordered in ED Medications  morphine (PF) 4 MG/ML injection 4 mg (has no administration in time range)    ED Course/ Medical Decision Making/ A&P   {   Click here for ABCD2, HEART and other calculatorsREFRESH Note before signing :1}  Medical Decision Making Amount and/or Complexity of Data Reviewed Labs: ordered. Radiology: ordered.  Risk Prescription drug management.   This patient complains of ***; this involves an extensive number of treatment Options and is a complaint that carries with it a high risk of complications and morbidity. The differential includes ***  I ordered, reviewed and interpreted labs, which included *** I ordered medication *** and reviewed PMP when indicated. I ordered imaging studies which included *** and I independently    visualized and interpreted imaging which showed *** Additional history obtained from *** Previous records obtained and reviewed *** I consulted *** and discussed lab and imaging findings and discussed disposition.  Cardiac monitoring reviewed, *** Social determinants considered, *** Critical Interventions: ***  After the interventions stated above, I reevaluated the patient and found *** Admission and further testing considered, ***   {Document critical care time when appropriate:1} {Document review of labs and clinical decision tools ie heart score, Chads2Vasc2 etc:1}  {Document your independent review of radiology images, and any outside  records:1} {Document your discussion with family members, caretakers, and with consultants:1} {Document social determinants of health affecting pt's care:1} {Document your decision making why or why not admission, treatments were needed:1} Final Clinical Impression(s) / ED Diagnoses Final diagnoses:  None    Rx / DC Orders ED Discharge Orders     None

## 2022-11-26 NOTE — ED Notes (Signed)
Report called to receiving RN at [facility]. Pt being transported by [facility] in stable condition. Knee immobilizer on R knee. Provided pain medication prior transfer from ED.

## 2022-11-26 NOTE — ED Notes (Signed)
Patient transported to X-ray via stretcher 

## 2022-11-26 NOTE — ED Triage Notes (Addendum)
Pt presents via [facility] c/o fall at home. Pt was in bathroom and slipped and landed on right knee. Now complaining of injury and pain. Knee is splinted at this time. Pt is not on any blood thinners.   Pt on 2L oxygen chronically in evening.

## 2022-11-27 ENCOUNTER — Inpatient Hospital Stay (HOSPITAL_COMMUNITY): Payer: Medicare HMO

## 2022-11-27 ENCOUNTER — Other Ambulatory Visit: Payer: Self-pay

## 2022-11-27 ENCOUNTER — Inpatient Hospital Stay (HOSPITAL_COMMUNITY): Payer: Medicare HMO | Admitting: Certified Registered"

## 2022-11-27 ENCOUNTER — Encounter (HOSPITAL_COMMUNITY)
Admission: EM | Disposition: A | Discharge status: Skilled Nursing Facility | Payer: Self-pay | Source: Home / Self Care | Attending: Internal Medicine

## 2022-11-27 ENCOUNTER — Encounter (HOSPITAL_COMMUNITY): Payer: Self-pay | Admitting: Family Medicine

## 2022-11-27 DIAGNOSIS — W19XXXA Unspecified fall, initial encounter: Secondary | ICD-10-CM | POA: Diagnosis not present

## 2022-11-27 DIAGNOSIS — Y92009 Unspecified place in unspecified non-institutional (private) residence as the place of occurrence of the external cause: Secondary | ICD-10-CM

## 2022-11-27 DIAGNOSIS — F411 Generalized anxiety disorder: Secondary | ICD-10-CM

## 2022-11-27 DIAGNOSIS — J9611 Chronic respiratory failure with hypoxia: Secondary | ICD-10-CM | POA: Insufficient documentation

## 2022-11-27 DIAGNOSIS — E1165 Type 2 diabetes mellitus with hyperglycemia: Secondary | ICD-10-CM

## 2022-11-27 DIAGNOSIS — J410 Simple chronic bronchitis: Secondary | ICD-10-CM

## 2022-11-27 DIAGNOSIS — S72301A Unspecified fracture of shaft of right femur, initial encounter for closed fracture: Secondary | ICD-10-CM | POA: Diagnosis not present

## 2022-11-27 DIAGNOSIS — S72401A Unspecified fracture of lower end of right femur, initial encounter for closed fracture: Secondary | ICD-10-CM

## 2022-11-27 DIAGNOSIS — E1169 Type 2 diabetes mellitus with other specified complication: Secondary | ICD-10-CM

## 2022-11-27 DIAGNOSIS — E785 Hyperlipidemia, unspecified: Secondary | ICD-10-CM

## 2022-11-27 HISTORY — PX: ORIF FEMUR FRACTURE: SHX2119

## 2022-11-27 LAB — GLUCOSE, CAPILLARY
Glucose-Capillary: 194 mg/dL — ABNORMAL HIGH (ref 70–99)
Glucose-Capillary: 145 mg/dL — ABNORMAL HIGH (ref 70–99)
Glucose-Capillary: 107 mg/dL — ABNORMAL HIGH (ref 70–99)
Glucose-Capillary: 152 mg/dL — ABNORMAL HIGH (ref 70–99)
Glucose-Capillary: 138 mg/dL — ABNORMAL HIGH (ref 70–99)
Glucose-Capillary: 190 mg/dL — ABNORMAL HIGH (ref 70–99)
Glucose-Capillary: 277 mg/dL — ABNORMAL HIGH (ref 70–99)

## 2022-11-27 LAB — CBC WITH DIFFERENTIAL/PLATELET
Abs Immature Granulocytes: 0.02 10*3/uL (ref 0.00–0.07)
Basophils Absolute: 0 10*3/uL (ref 0.0–0.1)
Basophils Relative: 0 %
Eosinophils Absolute: 0 10*3/uL (ref 0.0–0.5)
Eosinophils Relative: 0 %
HCT: 38.9 % (ref 36.0–46.0)
Hemoglobin: 12.7 g/dL (ref 12.0–15.0)
Immature Granulocytes: 0 %
Lymphocytes Relative: 21 %
Lymphs Abs: 1.5 10*3/uL (ref 0.7–4.0)
MCH: 31.4 pg (ref 26.0–34.0)
MCHC: 32.6 g/dL (ref 30.0–36.0)
MCV: 96.3 fL (ref 80.0–100.0)
Monocytes Absolute: 0.6 10*3/uL (ref 0.1–1.0)
Monocytes Relative: 8 %
Neutro Abs: 5 10*3/uL (ref 1.7–7.7)
Neutrophils Relative %: 71 %
Platelets: 283 10*3/uL (ref 150–400)
RBC: 4.04 MIL/uL (ref 3.87–5.11)
RDW: 12.8 % (ref 11.5–15.5)
WBC: 7 10*3/uL (ref 4.0–10.5)
nRBC: 0 % (ref 0.0–0.2)

## 2022-11-27 LAB — CREATININE, SERUM
Creatinine, Ser: 0.8 mg/dL (ref 0.44–1.00)
GFR, Estimated: >60 mL/min

## 2022-11-27 LAB — CBC
HCT: 37.6 % (ref 36.0–46.0)
Hemoglobin: 12.1 g/dL (ref 12.0–15.0)
MCH: 30.8 pg (ref 26.0–34.0)
MCHC: 32.2 g/dL (ref 30.0–36.0)
MCV: 95.7 fL (ref 80.0–100.0)
Platelets: 266 10*3/uL (ref 150–400)
RBC: 3.93 MIL/uL (ref 3.87–5.11)
RDW: 13.2 % (ref 11.5–15.5)
WBC: 7.4 10*3/uL (ref 4.0–10.5)
nRBC: 0 % (ref 0.0–0.2)

## 2022-11-27 LAB — MAGNESIUM: Magnesium: 1.8 mg/dL (ref 1.7–2.4)

## 2022-11-27 LAB — URINALYSIS, ROUTINE W REFLEX MICROSCOPIC
Bacteria, UA: NONE SEEN
Bilirubin Urine: NEGATIVE
Glucose, UA: >=500 mg/dL — AB
Ketones, ur: 5 mg/dL — AB
Nitrite: NEGATIVE
Protein, ur: 100 mg/dL — AB
Specific Gravity, Urine: 1.01 (ref 1.005–1.030)
pH: 6 (ref 5.0–8.0)

## 2022-11-27 LAB — COMPREHENSIVE METABOLIC PANEL
ALT: 22 U/L (ref 0–44)
AST: 24 U/L (ref 15–41)
Albumin: 3.6 g/dL (ref 3.5–5.0)
Alkaline Phosphatase: 42 U/L (ref 38–126)
Anion gap: 10 (ref 5–15)
BUN: 12 mg/dL (ref 8–23)
CO2: 24 mmol/L (ref 22–32)
Calcium: 10.7 mg/dL — ABNORMAL HIGH (ref 8.9–10.3)
Chloride: 98 mmol/L (ref 98–111)
Creatinine, Ser: 0.58 mg/dL (ref 0.44–1.00)
GFR, Estimated: >60 mL/min (ref >60)
Glucose, Bld: 189 mg/dL — ABNORMAL HIGH (ref 70–99)
Potassium: 3.9 mmol/L (ref 3.5–5.1)
Sodium: 132 mmol/L — ABNORMAL LOW (ref 135–145)
Total Bilirubin: 0.7 mg/dL (ref 0.3–1.2)
Total Protein: 7.4 g/dL (ref 6.5–8.1)

## 2022-11-27 SURGERY — OPEN REDUCTION INTERNAL FIXATION (ORIF) DISTAL FEMUR FRACTURE
Anesthesia: General | Site: Leg Upper | Laterality: Right

## 2022-11-27 MED ORDER — ORAL CARE MOUTH RINSE: 15.0000 mL | Freq: Once | OROMUCOSAL | Status: AC

## 2022-11-27 MED ORDER — METHOCARBAMOL 1000 MG/10ML IJ SOLN: 500.0000 mg | Freq: Four times a day (QID) | INTRAVENOUS | Status: DC | PRN

## 2022-11-27 MED ORDER — BENAZEPRIL HCL 20 MG PO TABS
40.0000 mg | ORAL_TABLET | Freq: Every day | ORAL | Status: DC
  Administered 2022-11-27: 40 mg via ORAL
  Filled 2022-11-27 (×2): qty 2

## 2022-11-27 MED ORDER — INSULIN ASPART 100 UNIT/ML IJ SOLN
0.0000 [IU] | Freq: Every day | INTRAMUSCULAR | Status: DC
  Administered 2022-11-27: 2 [IU] via SUBCUTANEOUS

## 2022-11-27 MED ORDER — PROPOFOL 10 MG/ML IV BOLUS
INTRAVENOUS | Status: DC | PRN
  Administered 2022-11-27: 50 mg via INTRAVENOUS
  Administered 2022-11-27: 100 mg via INTRAVENOUS

## 2022-11-27 MED ORDER — SODIUM CHLORIDE 0.9 % IV SOLN: INTRAVENOUS | Status: DC

## 2022-11-27 MED ORDER — METHOCARBAMOL 500 MG PO TABS
500.0000 mg | ORAL_TABLET | Freq: Four times a day (QID) | ORAL | Status: DC | PRN
  Administered 2022-11-28: 500 mg via ORAL
  Filled 2022-11-27: qty 1

## 2022-11-27 MED ORDER — ENOXAPARIN SODIUM 40 MG/0.4ML IJ SOSY
40.0000 mg | PREFILLED_SYRINGE | INTRAMUSCULAR | Status: DC
  Administered 2022-11-28 – 2022-12-03 (×6): 40 mg via SUBCUTANEOUS
  Filled 2022-11-27 (×6): qty 0.4

## 2022-11-27 MED ORDER — DEXAMETHASONE SODIUM PHOSPHATE 10 MG/ML IJ SOLN
INTRAMUSCULAR | Status: DC | PRN
  Administered 2022-11-27: 4 mg via INTRAVENOUS

## 2022-11-27 MED ORDER — FENOFIBRATE 54 MG PO TABS
54.0000 mg | ORAL_TABLET | Freq: Every day | ORAL | Status: DC
  Administered 2022-11-28 – 2022-12-03 (×6): 54 mg via ORAL
  Filled 2022-11-27 (×7): qty 1

## 2022-11-27 MED ORDER — PHENYLEPHRINE HCL-NACL 20-0.9 MG/250ML-% IV SOLN
INTRAVENOUS | Status: DC | PRN
  Administered 2022-11-27: 50 ug/min via INTRAVENOUS

## 2022-11-27 MED ORDER — LORAZEPAM 0.5 MG PO TABS: 0.5000 mg | ORAL_TABLET | Freq: Two times a day (BID) | ORAL | Status: DC | PRN

## 2022-11-27 MED ORDER — HYDROMORPHONE HCL 1 MG/ML IJ SOLN
0.2500 mg | INTRAMUSCULAR | Status: DC | PRN
  Administered 2022-11-27 (×4): 0.25 mg via INTRAVENOUS

## 2022-11-27 MED ORDER — PROPOFOL 10 MG/ML IV BOLUS
INTRAVENOUS | Status: AC
  Filled 2022-11-27: qty 20

## 2022-11-27 MED ORDER — VANCOMYCIN HCL 1000 MG IV SOLR
INTRAVENOUS | Status: DC | PRN
  Administered 2022-11-27: 1000 mg via TOPICAL

## 2022-11-27 MED ORDER — GABAPENTIN 100 MG PO CAPS
100.0000 mg | ORAL_CAPSULE | Freq: Three times a day (TID) | ORAL | Status: DC
  Administered 2022-11-27 – 2022-11-29 (×6): 100 mg via ORAL
  Filled 2022-11-27 (×8): qty 1

## 2022-11-27 MED ORDER — AMLODIPINE BESYLATE 10 MG PO TABS
10.0000 mg | ORAL_TABLET | Freq: Every day | ORAL | Status: DC
  Administered 2022-11-27 – 2022-12-03 (×6): 10 mg via ORAL
  Filled 2022-11-27 (×7): qty 1

## 2022-11-27 MED ORDER — FENTANYL CITRATE (PF) 250 MCG/5ML IJ SOLN
INTRAMUSCULAR | Status: DC | PRN
  Administered 2022-11-27 (×2): 50 ug via INTRAVENOUS

## 2022-11-27 MED ORDER — DOCUSATE SODIUM 100 MG PO CAPS
100.0000 mg | ORAL_CAPSULE | Freq: Two times a day (BID) | ORAL | Status: DC
  Administered 2022-11-27 – 2022-11-28 (×3): 100 mg via ORAL
  Filled 2022-11-27 (×3): qty 1

## 2022-11-27 MED ORDER — VANCOMYCIN HCL 1000 MG IV SOLR
INTRAVENOUS | Status: AC
  Filled 2022-11-27: qty 20

## 2022-11-27 MED ORDER — LIDOCAINE 2% (20 MG/ML) 5 ML SYRINGE
INTRAMUSCULAR | Status: DC | PRN
  Administered 2022-11-27: 100 mg via INTRAVENOUS

## 2022-11-27 MED ORDER — HYDROMORPHONE HCL 1 MG/ML IJ SOLN
INTRAMUSCULAR | Status: AC
  Filled 2022-11-27: qty 1

## 2022-11-27 MED ORDER — ASPIRIN 81 MG PO TBEC
81.0000 mg | DELAYED_RELEASE_TABLET | Freq: Every day | ORAL | Status: DC
  Administered 2022-11-28 – 2022-12-03 (×6): 81 mg via ORAL
  Filled 2022-11-27 (×7): qty 1

## 2022-11-27 MED ORDER — OXYCODONE HCL 5 MG PO TABS
5.0000 mg | ORAL_TABLET | ORAL | Status: DC | PRN
  Administered 2022-11-27 – 2022-12-01 (×5): 5 mg via ORAL
  Filled 2022-11-27 (×5): qty 1

## 2022-11-27 MED ORDER — ONDANSETRON HCL 4 MG PO TABS: 4.0000 mg | ORAL_TABLET | Freq: Four times a day (QID) | ORAL | Status: DC | PRN

## 2022-11-27 MED ORDER — ONDANSETRON HCL 4 MG/2ML IJ SOLN
INTRAMUSCULAR | Status: AC
  Filled 2022-11-27: qty 2

## 2022-11-27 MED ORDER — CEFAZOLIN SODIUM-DEXTROSE 2-4 GM/100ML-% IV SOLN
2.0000 g | Freq: Three times a day (TID) | INTRAVENOUS | Status: AC
  Administered 2022-11-27 – 2022-11-28 (×3): 2 g via INTRAVENOUS
  Filled 2022-11-27 (×3): qty 100

## 2022-11-27 MED ORDER — FENTANYL CITRATE (PF) 250 MCG/5ML IJ SOLN
INTRAMUSCULAR | Status: AC
  Filled 2022-11-27: qty 5

## 2022-11-27 MED ORDER — CHLORHEXIDINE GLUCONATE 0.12 % MT SOLN
15.0000 mL | Freq: Once | OROMUCOSAL | Status: AC
  Administered 2022-11-27: 15 mL via OROMUCOSAL
  Filled 2022-11-27: qty 15

## 2022-11-27 MED ORDER — BUDESONIDE 0.25 MG/2ML IN SUSP
0.2500 mg | Freq: Two times a day (BID) | RESPIRATORY_TRACT | Status: DC
  Administered 2022-11-27 – 2022-12-03 (×12): 0.25 mg via RESPIRATORY_TRACT
  Filled 2022-11-27 (×13): qty 2

## 2022-11-27 MED ORDER — EPHEDRINE SULFATE (PRESSORS) 50 MG/ML IJ SOLN
INTRAMUSCULAR | Status: DC | PRN
  Administered 2022-11-27: 10 mg via INTRAVENOUS
  Administered 2022-11-27 (×2): 5 mg via INTRAVENOUS

## 2022-11-27 MED ORDER — LIDOCAINE 2% (20 MG/ML) 5 ML SYRINGE
INTRAMUSCULAR | Status: AC
  Filled 2022-11-27: qty 5

## 2022-11-27 MED ORDER — ARFORMOTEROL TARTRATE 15 MCG/2ML IN NEBU
15.0000 ug | INHALATION_SOLUTION | Freq: Two times a day (BID) | RESPIRATORY_TRACT | Status: DC
  Administered 2022-11-27 – 2022-12-03 (×12): 15 ug via RESPIRATORY_TRACT
  Filled 2022-11-27 (×13): qty 2

## 2022-11-27 MED ORDER — ROSUVASTATIN CALCIUM 20 MG PO TABS
40.0000 mg | ORAL_TABLET | Freq: Every day | ORAL | Status: DC
  Administered 2022-11-28 – 2022-12-03 (×6): 40 mg via ORAL
  Filled 2022-11-27 (×7): qty 2

## 2022-11-27 MED ORDER — PHENYLEPHRINE 80 MCG/ML (10ML) SYRINGE FOR IV PUSH (FOR BLOOD PRESSURE SUPPORT)
PREFILLED_SYRINGE | INTRAVENOUS | Status: DC | PRN
  Administered 2022-11-27: 160 ug via INTRAVENOUS
  Administered 2022-11-27 (×2): 80 ug via INTRAVENOUS

## 2022-11-27 MED ORDER — DEXAMETHASONE SODIUM PHOSPHATE 10 MG/ML IJ SOLN
INTRAMUSCULAR | Status: AC
  Filled 2022-11-27: qty 1

## 2022-11-27 MED ORDER — INSULIN ASPART 100 UNIT/ML IJ SOLN
0.0000 [IU] | Freq: Three times a day (TID) | INTRAMUSCULAR | Status: DC
  Administered 2022-11-27: 2 [IU] via SUBCUTANEOUS
  Administered 2022-11-27 – 2022-11-28 (×2): 3 [IU] via SUBCUTANEOUS
  Administered 2022-11-28 – 2022-11-29 (×2): 2 [IU] via SUBCUTANEOUS
  Administered 2022-11-29: 3 [IU] via SUBCUTANEOUS
  Administered 2022-11-29: 2 [IU] via SUBCUTANEOUS
  Administered 2022-11-30: 5 [IU] via SUBCUTANEOUS
  Administered 2022-11-30 – 2022-12-01 (×2): 3 [IU] via SUBCUTANEOUS
  Administered 2022-12-01 – 2022-12-02 (×2): 5 [IU] via SUBCUTANEOUS
  Administered 2022-12-02 – 2022-12-03 (×3): 3 [IU] via SUBCUTANEOUS

## 2022-11-27 MED ORDER — 0.9 % SODIUM CHLORIDE (POUR BTL) OPTIME
TOPICAL | Status: DC | PRN
  Administered 2022-11-27: 1000 mL

## 2022-11-27 MED ORDER — METOCLOPRAMIDE HCL 5 MG/ML IJ SOLN: 5.0000 mg | Freq: Three times a day (TID) | INTRAMUSCULAR | Status: DC | PRN

## 2022-11-27 MED ORDER — LACTATED RINGERS IV SOLN: INTRAVENOUS | Status: DC

## 2022-11-27 MED ORDER — ONDANSETRON HCL 4 MG/2ML IJ SOLN: 4.0000 mg | Freq: Four times a day (QID) | INTRAMUSCULAR | Status: DC | PRN

## 2022-11-27 MED ORDER — ONDANSETRON HCL 4 MG/2ML IJ SOLN
INTRAMUSCULAR | Status: DC | PRN
  Administered 2022-11-27: 4 mg via INTRAVENOUS

## 2022-11-27 MED ORDER — POLYETHYLENE GLYCOL 3350 17 G PO PACK: 17.0000 g | PACK | Freq: Every evening | ORAL | Status: DC | PRN

## 2022-11-27 MED ORDER — ACETAMINOPHEN 650 MG RE SUPP
650.0000 mg | Freq: Four times a day (QID) | RECTAL | Status: DC | PRN
  Filled 2022-11-27: qty 1

## 2022-11-27 MED ORDER — ACETAMINOPHEN 325 MG PO TABS
650.0000 mg | ORAL_TABLET | Freq: Four times a day (QID) | ORAL | Status: DC | PRN
  Administered 2022-11-27 – 2022-12-01 (×6): 650 mg via ORAL
  Filled 2022-11-27 (×6): qty 2

## 2022-11-27 MED ORDER — CEFAZOLIN SODIUM-DEXTROSE 2-3 GM-%(50ML) IV SOLR
INTRAVENOUS | Status: DC | PRN
  Administered 2022-11-27: 2 g via INTRAVENOUS

## 2022-11-27 MED ORDER — PHENYLEPHRINE 80 MCG/ML (10ML) SYRINGE FOR IV PUSH (FOR BLOOD PRESSURE SUPPORT)
PREFILLED_SYRINGE | INTRAVENOUS | Status: AC
  Filled 2022-11-27: qty 10

## 2022-11-27 MED ORDER — EPHEDRINE 5 MG/ML INJ
INTRAVENOUS | Status: AC
  Filled 2022-11-27: qty 5

## 2022-11-27 MED ORDER — METOCLOPRAMIDE HCL 5 MG PO TABS: 5.0000 mg | ORAL_TABLET | Freq: Three times a day (TID) | ORAL | Status: DC | PRN

## 2022-11-27 MED ORDER — CEFAZOLIN SODIUM 1 G IJ SOLR
INTRAMUSCULAR | Status: AC
  Filled 2022-11-27: qty 20

## 2022-11-27 SURGICAL SUPPLY — 82 items
ADH SKN CLS APL DERMABOND .7 (GAUZE/BANDAGES/DRESSINGS) ×1
APL PRP STRL LF DISP 70% ISPRP (MISCELLANEOUS) ×1
BAG COUNTER SPONGE SURGICOUNT (BAG) ×1 IMPLANT
BAG SPNG CNTER NS LX DISP (BAG) ×1
BIT DRILL 4.3 (BIT) ×1
BIT DRILL 4.3X300MM (BIT) IMPLANT
BIT DRILL LONG 3.3 (BIT) IMPLANT
BIT DRILL QC 3.3X195 (BIT) IMPLANT
BLADE CLIPPER SURG (BLADE) IMPLANT
BNDG CMPR 5X4 KNIT ELC UNQ LF (GAUZE/BANDAGES/DRESSINGS) ×1
BNDG CMPR 5X6 CHSV STRCH STRL (GAUZE/BANDAGES/DRESSINGS)
BNDG CMPR 5X62 HK CLSR LF (GAUZE/BANDAGES/DRESSINGS) ×1
BNDG CMPR MED 10X6 ELC LF (GAUZE/BANDAGES/DRESSINGS)
BNDG COHESIVE 6X5 TAN ST LF (GAUZE/BANDAGES/DRESSINGS) ×1 IMPLANT
BNDG ELASTIC 4INX 5YD STR LF (GAUZE/BANDAGES/DRESSINGS) IMPLANT
BNDG ELASTIC 6INX 5YD STR LF (GAUZE/BANDAGES/DRESSINGS) IMPLANT
BNDG ELASTIC 6X10 VLCR STRL LF (GAUZE/BANDAGES/DRESSINGS) ×1 IMPLANT
BRUSH SCRUB EZ PLAIN DRY (MISCELLANEOUS) ×2 IMPLANT
CANISTER SUCT 3000ML PPV (MISCELLANEOUS) ×1 IMPLANT
CAP LOCK NCB (Cap) IMPLANT
CHLORAPREP W/TINT 26 (MISCELLANEOUS) ×1 IMPLANT
COVER SURGICAL LIGHT HANDLE (MISCELLANEOUS) ×1 IMPLANT
DERMABOND ADVANCED .7 DNX12 (GAUZE/BANDAGES/DRESSINGS) IMPLANT
DRAPE C-ARM 42X72 X-RAY (DRAPES) ×1 IMPLANT
DRAPE C-ARMOR (DRAPES) ×1 IMPLANT
DRAPE HALF SHEET 40X57 (DRAPES) ×2 IMPLANT
DRAPE ORTHO SPLIT 77X108 STRL (DRAPES) ×2
DRAPE SURG 17X23 STRL (DRAPES) ×1 IMPLANT
DRAPE SURG ORHT 6 SPLT 77X108 (DRAPES) ×2 IMPLANT
DRAPE U-SHAPE 47X51 STRL (DRAPES) ×1 IMPLANT
DRESSING MEPILEX FLEX 4X4 (GAUZE/BANDAGES/DRESSINGS) IMPLANT
DRSG ADAPTIC 3X8 NADH LF (GAUZE/BANDAGES/DRESSINGS) IMPLANT
DRSG MEPILEX FLEX 4X4 (GAUZE/BANDAGES/DRESSINGS)
DRSG MEPILEX POST OP 4X12 (GAUZE/BANDAGES/DRESSINGS) IMPLANT
DRSG MEPILEX POST OP 4X8 (GAUZE/BANDAGES/DRESSINGS) IMPLANT
ELECT REM PT RETURN 9FT ADLT (ELECTROSURGICAL) ×1
ELECTRODE REM PT RTRN 9FT ADLT (ELECTROSURGICAL) ×1 IMPLANT
GAUZE PAD ABD 8X10 STRL (GAUZE/BANDAGES/DRESSINGS) ×3 IMPLANT
GAUZE SPONGE 4X4 12PLY STRL (GAUZE/BANDAGES/DRESSINGS) ×1 IMPLANT
GLOVE BIO SURGEON STRL SZ 6.5 (GLOVE) ×3 IMPLANT
GLOVE BIO SURGEON STRL SZ7.5 (GLOVE) ×4 IMPLANT
GLOVE BIOGEL PI IND STRL 6.5 (GLOVE) ×1 IMPLANT
GLOVE BIOGEL PI IND STRL 7.5 (GLOVE) ×1 IMPLANT
GOWN STRL REUS W/ TWL LRG LVL3 (GOWN DISPOSABLE) ×3 IMPLANT
GOWN STRL REUS W/TWL LRG LVL3 (GOWN DISPOSABLE) ×3
K-WIRE 2.0 (WIRE) ×1
K-WIRE FXSTD 280X2XNS SS (WIRE) ×1
KIT BASIN OR (CUSTOM PROCEDURE TRAY) ×1 IMPLANT
KIT TURNOVER KIT B (KITS) ×1 IMPLANT
KWIRE FXSTD 280X2XNS SS (WIRE) IMPLANT
NS IRRIG 1000ML POUR BTL (IV SOLUTION) ×1 IMPLANT
PACK ORTHO EXTREMITY (CUSTOM PROCEDURE TRAY) IMPLANT
PACK TOTAL JOINT (CUSTOM PROCEDURE TRAY) ×1 IMPLANT
PAD ARMBOARD 7.5X6 YLW CONV (MISCELLANEOUS) ×2 IMPLANT
PAD CAST 4YDX4 CTTN HI CHSV (CAST SUPPLIES) ×1 IMPLANT
PADDING CAST COTTON 4X4 STRL (CAST SUPPLIES) ×1
PADDING CAST COTTON 6X4 STRL (CAST SUPPLIES) ×1 IMPLANT
PLATE DISTAL FEMUR 15H 317M RT (Plate) IMPLANT
SCREW 5.0 70MM (Screw) IMPLANT
SCREW 5.0 80MM (Screw) IMPLANT
SCREW CORT NCB SELFTAP 5.0X42 (Screw) IMPLANT
SCREW CORTICAL NCB 5.0X40 (Screw) IMPLANT
SCREW NCB 3.5X75X5X6.2XST (Screw) IMPLANT
SCREW NCB 4.0MX42M (Screw) IMPLANT
SCREW NCB 4.0X40MM (Screw) IMPLANT
SCREW NCB 5.0X75MM (Screw) ×2 IMPLANT
SCREW NCB 5.0X85MM (Screw) IMPLANT
SPONGE T-LAP 18X18 ~~LOC~~+RFID (SPONGE) IMPLANT
STAPLER VISISTAT 35W (STAPLE) ×1 IMPLANT
SUCTION FRAZIER HANDLE 10FR (MISCELLANEOUS) ×1
SUCTION TUBE FRAZIER 10FR DISP (MISCELLANEOUS) ×1 IMPLANT
SUT ETHILON 3 0 PS 1 (SUTURE) ×2 IMPLANT
SUT MNCRL AB 3-0 PS2 27 (SUTURE) IMPLANT
SUT VIC AB 0 CT1 27 (SUTURE)
SUT VIC AB 0 CT1 27XBRD ANBCTR (SUTURE) IMPLANT
SUT VIC AB 1 CT1 27 (SUTURE) ×1
SUT VIC AB 1 CT1 27XBRD ANBCTR (SUTURE) IMPLANT
SUT VIC AB 2-0 CT1 27 (SUTURE) ×1
SUT VIC AB 2-0 CT1 TAPERPNT 27 (SUTURE) ×2 IMPLANT
TOWEL GREEN STERILE (TOWEL DISPOSABLE) ×2 IMPLANT
TRAY FOLEY MTR SLVR 16FR STAT (SET/KITS/TRAYS/PACK) IMPLANT
WATER STERILE IRR 1000ML POUR (IV SOLUTION) ×2 IMPLANT

## 2022-11-27 NOTE — Assessment & Plan Note (Signed)
-   Hyperglycemia today at 224 - Hold metformin - Sliding scale coverage

## 2022-11-27 NOTE — H&P (Signed)
History and Physical    Patient: Caitlin Mccarthy ZOX:[id number] DOB: 12-Nov-1935 DOA: 11/26/2022 DOS: the patient was seen and examined on 11/27/2022 PCP: Bennie Pierini, FNP  Patient coming from: Home  Chief Complaint:  Chief Complaint  Patient presents with   Fall   Knee Injury   HPI: Caitlin Mccarthy is a 87 y.o. female with medical history significant of anxiety, depression, GERD, hyperlipidemia, hypertension, diabetes mellitus type 2, former smoker, COPD, and more presents the ED with a chief complaint of fall.  Patient reports that she just got done using the bathroom, she tried to stand up from her commode, she started to fall backwards, and missed to the commode on the way back down.  She reports she fell and she could not get up.  She thinks her fall was secondary to weakness in her right thigh.  This weakness is not new.  She did not have any lightheadedness, chest pain, palpitations, dyspnea.  Patient is on oxygen at home and has been since her last discharge from the hospital.  Patient reports her weakness is constant since her last discharge from the hospital as well.  It has not been any worse or any better.  She denies any fever, dysuria, melena, hematochezia, cough.  Patient has no other complaints at this time.  Patient does not smoke, does not drink, does not use illicit drugs.  She reports she has been vaccinated for COVID.  Patient is DNR. Review of Systems: As mentioned in the history of present illness. All other systems reviewed and are negative. Past Medical History:  Diagnosis Date   Anxiety    Depression    GERD (gastroesophageal reflux disease)    History of acute pyelonephritis    last episode 04-13-2015  w/ sepsis   History of kidney stones    History of recurrent UTIs    MULTIPLE   Hyperlipidemia    Hypertension    OA (osteoarthritis)    knees, hips, hands   Osteoporosis    Poor memory    especially when has uti   Renal calculus, left     Stroke (HCC)    Type 2 diabetes mellitus (HCC)    Vitamin D deficiency 05/10/2016   Past Surgical History:  Procedure Laterality Date   CATARACT EXTRACTION W/PHACO  06/06/2011   Procedure: CATARACT EXTRACTION PHACO AND INTRAOCULAR LENS PLACEMENT (IOC);  Surgeon: Gemma Payor;  Location: [facility];  Service: Ophthalmology;  Laterality: Right;  CDE=12.77   CATARACT EXTRACTION W/PHACO  06/27/2011   Procedure: CATARACT EXTRACTION PHACO AND INTRAOCULAR LENS PLACEMENT (IOC);  Surgeon: Gemma Payor;  Location: [facility];  Service: Ophthalmology;  Laterality: Left;  CDE:13.96   CYSTOSCOPY WITH URETEROSCOPY AND STENT PLACEMENT Right 04/27/2015   Procedure: CYSTOSCOPY WITH RIGHT URETEROSCOPY, BASKET REMOVAL OF STONE, REMOVAL OF RIGHT NEPHROSTOMY TUBE;  Surgeon: Bjorn Pippin, MD;  Location: [facility];  Service: Urology;  Laterality: Right;   CYSTOSCOPY/URETEROSCOPY/HOLMIUM LASER/STENT PLACEMENT Left 12/24/2016   Procedure: LEFT URETEROSCOPY WITH HOLMIUM LASER AND STENT PLACEMENT;  Surgeon: Bjorn Pippin, MD;  Location: [facility];  Service: Urology;  Laterality: Left;   EXTRACORPOREAL SHOCK WAVE LITHOTRIPSY  left 04-01-2016;  1980's   FRACTURE SURGERY     HIP ARTHROPLASTY     HOLMIUM LASER APPLICATION Right 04/27/2015   Procedure: HOLMIUM LASER APPLICATION;  Surgeon: Bjorn Pippin, MD;  Location: [facility];  Service: Urology;  Laterality: Right;   KNEE ARTHROPLASTY     KNEE ARTHROSCOPY Bilateral  right 2005//  left ?   LOOP RECORDER INSERTION N/A 04/01/2017   Procedure: LOOP RECORDER INSERTION;  Surgeon: Hillis Range, MD;  Location: [facility];  Service: Cardiovascular;  Laterality: N/A;   TOTAL HIP ARTHROPLASTY  10/28/2011   Procedure: TOTAL HIP ARTHROPLASTY;  Surgeon: Loanne Drilling, MD;  Location: [facility];  Service: Orthopedics;  Laterality: Right;   TOTAL KNEE ARTHROPLASTY Bilateral left 03-09-2007//  right 2006   TRANSTHORACIC ECHOCARDIOGRAM  10/06/2006    normal echo,  ef 55-60%   TUBAL LIGATION     Social History:  reports that she quit smoking about 3 weeks ago. Her smoking use included cigarettes. She has a 10.50 pack-year smoking history. She has never used smokeless tobacco. She reports that she does not drink alcohol and does not use drugs.  No Active Allergies  Family History  Problem Relation Age of Onset   Kidney disease Mother    Diabetes Mother    Congestive Heart Failure Father    Heart failure Father    Heart disease Brother    Alcohol abuse Brother    Anesthesia problems Neg Hx    Hypotension Neg Hx    Malignant hyperthermia Neg Hx    Pseudochol deficiency Neg Hx     Prior to Admission medications   Medication Sig Start Date End Date Taking? Authorizing Provider  amLODipine-benazepril (LOTREL) 10-40 MG capsule Take 1 capsule by mouth daily. 08/30/22  Yes Daphine Deutscher, Mary-Margaret, FNP  aspirin EC 81 MG tablet Take 1 tablet (81 mg total) by mouth daily. 04/29/18  Yes Laqueta Linden, MD  Cholecalciferol 50 MCG (2000 UT) CAPS Take 1 capsule (2,000 Units total) by mouth daily with breakfast. 11/21/22  Yes Nida, Denman George, MD  fenofibrate (TRICOR) 145 MG tablet Take 1 tablet (145 mg total) by mouth daily. 08/30/22  Yes Daphine Deutscher, Mary-Margaret, FNP  gabapentin (NEURONTIN) 100 MG capsule Take 1 capsule (100 mg total) by mouth 3 (three) times daily. 08/30/22  Yes Ranganathan, Mary-Margaret, FNP  LORazepam (ATIVAN) 0.5 MG tablet Take 1 tablet (0.5 mg total) by mouth 2 (two) times daily as needed. for anxiety 08/30/22  Yes Daphine Deutscher, Mary-Margaret, FNP  metFORMIN (GLUCOPHAGE-XR) 500 MG 24 hr tablet Take 1 tablet (500 mg total) by mouth in the morning and at bedtime. 08/30/22  Yes Fleissner, Mary-Margaret, FNP  nystatin (MYCOSTATIN/NYSTOP) powder APPLY TOPICALLY FOUR TIMES DAILY Patient taking differently: Apply 1 Application topically 4 (four) times daily. 05/13/22  Yes Diebel, Mary-Margaret, FNP  ondansetron (ZOFRAN-ODT) 4 MG disintegrating  tablet TAKE 1 TABLET EVERY 8 HOURS AS NEEDED FOR NAUSEA AND VOMITING 08/13/21  Yes Daphine Deutscher, Mary-Margaret, FNP  OXYGEN Inhale 2 L into the lungs continuous as needed.   Yes [provider]  polyethylene glycol (MIRALAX / GLYCOLAX) 17 g packet Take 17 g by mouth daily.   Yes [provider]  PREMARIN vaginal cream Apply 1 application topically daily as needed (irritation).  12/06/16  Yes [provider]  rosuvastatin (CRESTOR) 40 MG tablet TAKE ONE (1) TABLET EACH DAY 08/30/22  Yes Daphine Deutscher, Mary-Margaret, FNP  traMADol (ULTRAM) 50 MG tablet Take 2 tablets (100 mg total) by mouth 2 (two) times daily. TAKE ONE TABLET FOUR TIMES DAILY 08/30/22  Yes Daphine Deutscher, Mary-Margaret, FNP  Alcohol Swabs (B-D SINGLE USE SWABS REGULAR) PADS Test BS daily and as needed Dx E11.65 01/05/20   Raliegh Ip, DO  Blood Glucose Calibration (TRUE METRIX LEVEL 1) Low SOLN Use with glucose monitor Dx E11.65 01/05/20  Delynn Flavin M, DO  Blood Glucose Monitoring Suppl (TRUE METRIX AIR GLUCOSE METER) w/Device KIT Test BS daily and as needed Dx E11.65 01/05/20   Raliegh Ip, DO  glucose blood (TRUE METRIX BLOOD GLUCOSE TEST) test strip Test BS daily and as needed Dx E11.65 01/05/20   Raliegh Ip, DO  TRUEplus Lancets 33G MISC Test BS daily and as needed Dx E11.65 01/05/20   Raliegh Ip, DO    Physical Exam: Vitals:   11/26/22 2200 11/26/22 2230 11/26/22 2300 11/27/22 0033  BP: (!) 165/89 (!) 170/71 (!) 163/77 (!) 166/77  Pulse:    91  Resp: (!) 23 19 13 20   Temp:    97.7 F (36.5 C)  TempSrc:    Oral  SpO2:    95%   1.  General: Patient lying supine in bed,  no acute distress   2. Psychiatric: Alert and oriented x 3, mood and behavior normal for situation, pleasant and cooperative with exam   3. Neurologic: Speech and language are normal, face is symmetric, moves all 4 extremities voluntarily, at baseline without acute deficits on limited exam   4. HEENMT:  Head is  atraumatic, normocephalic, pupils reactive to light, neck is supple, trachea is midline, mucous membranes are moist   5. Respiratory : Lungs are clear to auscultation bilaterally without wheezing, rhonchi, rales, no cyanosis, no increase in work of breathing or accessory muscle use   6. Cardiovascular : Heart rate normal, rhythm is regular, no murmurs, rubs or gallops, no peripheral edema, peripheral pulses palpated   7. Gastrointestinal:  Abdomen is soft, nondistended, nontender to palpation bowel sounds active, no masses or organomegaly palpated   8. Skin:  Skin is warm, dry and intact without rashes, acute lesions, or ulcers on limited exam   9.Musculoskeletal:   no asymmetry in tone, no peripheral edema, peripheral pulses palpated, no tenderness to palpation in the extremities  Data Reviewed: In the ED Temp 98.2, heart rate 70, respiratory 18, blood pressure 164/70, satting at 97% on 2 L nasal cannula No leukocytosis with white blood cell count of 7.5, hemoglobin 13.0 Chemistry is unremarkable aside from a hyperglycemia at 224 Chest x-ray shows no active disease Imaging of the femur shows acute fracture of the distal right femoral shaft EKG shows ectopic atrial rhythm with a QTc of 4 and 65 Consult to Ortho-advised patient should be admitted to [facility] for possible surgery in the a.m. Patient follows with [facility] Admission requested for femoral fracture Assessment and Plan: * Femur fracture, right (HCC) - Secondary to fall - Imaging reads acute fracture of distal right femoral shaft intact right knee replacement - Ortho consulted and plans for surgery in the a.m. - N.p.o. after midnight - Pain management  Chronic respiratory failure with hypoxia (HCC) - Requiring oxygen since discharge from last hospitalization earlier in May - On baseline O2 supplementation of 2 L nasal cannula - No complaints of worsening dyspnea - Continue to monitor  Fall at home, initial  encounter - Secondary to generalized weakness it has been present since the last hospitalization - Resulting distal femur fracture just proximal to the right knee replacement - Ortho consulted and recommended admission to [facility] - Plan for surgery in the a.m. - N.p.o. after midnight - Pain control with pain scale - Continue to monitor  Hyperlipidemia due to type 2 diabetes mellitus (HCC) - Continue Crestor and fenofibrate  COPD (chronic obstructive pulmonary disease) (HCC) - Recently treated with steroids earlier  this month - Continue Brovana and Pulmicort - Continue to monitor  Type 2 diabetes mellitus (HCC) - Hyperglycemia today at 224 - Hold metformin - Sliding scale coverage  GAD (generalized anxiety disorder) - Continue Ativan      Advance Care Planning:   Code Status: DNR  Consults: Ortho  Family Communication: Daughter at bedside  Severity of Illness: The appropriate patient status for this patient is INPATIENT. Inpatient status is judged to be reasonable and necessary in order to provide the required intensity of service to ensure the patient's safety. The patient's presenting symptoms, physical exam findings, and initial radiographic and laboratory data in the context of their chronic comorbidities is felt to place them at high risk for further clinical deterioration. Furthermore, it is not anticipated that the patient will be medically stable for discharge from the hospital within 2 midnights of admission.   * I certify that at the point of admission it is my clinical judgment that the patient will require inpatient hospital care spanning beyond 2 midnights from the point of admission due to high intensity of service, high risk for further deterioration and high frequency of surveillance required.*  Author: Lilyan Gilford, DO 11/27/2022 1:31 AM  For on call review [email or web address].

## 2022-11-27 NOTE — Op Note (Signed)
Orthopaedic Surgery Operative Note (CSN: [id number] ) Date of Surgery: 11/27/2022  Admit Date: 11/26/2022   Diagnoses: Pre-Op Diagnoses: Right periprosthetic supracondylar distal femur fracture  Post-Op Diagnosis: Same  Procedures: CPT 27511-Open reduction internal fixation of right supracondylar distal femur fracture  Surgeons : Primary: Roby Lofts, MD  Assistant: Ulyses Southward, PA-C  Location: OR 3   Anesthesia:General   Antibiotics: Ancef 2g preop with 1 gm vancomycin powder placed topically   Tourniquet time: none used    Estimated Blood Loss: 50 mL  Complications:None   Specimens:None  Implants: Implant Name Type Inv. Item Serial No. Manufacturer Lot No. LRB No. Used Action  PLATE DISTAL FEMUR 15H 317M RT - [id number] Plate PLATE DISTAL FEMUR 15H 317M RT  [employer](ORTH,TRAU,BIO,SG)  Right 1 Implanted  CAP LOCK NCB - [id number] Cap CAP LOCK NCB  [employer](ORTH,TRAU,BIO,SG)  Right 8 Implanted  SCREW CORTICAL NCB 5.0X40 - [id number] Screw SCREW CORTICAL NCB 5.0X40  [employer](ORTH,TRAU,BIO,SG)  Right 1 Implanted  SCREW NCB 5.0X75MM - [id number] Screw SCREW NCB 5.0X75MM  [employer](ORTH,TRAU,BIO,SG)  Right 2 Implanted  SCREW 5.0 - [id number] Screw SCREW 5.0  [employer](ORTH,TRAU,BIO,SG)  Right 1 Implanted  SCREW CORT NCB SELFTAP 5.0X42 - [id number] Screw SCREW CORT NCB SELFTAP 5.0X42  [employer](ORTH,TRAU,BIO,SG)  Right 1 Implanted  SCREW NCB 5.0X85MM - [id number] Screw SCREW NCB 5.0X85MM  [employer](ORTH,TRAU,BIO,SG)  Right 1 Implanted  SCREW 5.0 - [id number] Screw SCREW 5.0  [employer](ORTH,TRAU,BIO,SG)  Right 1 Implanted  SCREW NCB 4.0X40MM - [id number] Screw SCREW NCB 4.0X40MM  [employer](ORTH,TRAU,BIO,SG)  Right 2 Implanted  SCREW NCB 4.0MX42M - [id number] Screw SCREW NCB 4.0MX42M  [employer](ORTH,TRAU,BIO,SG)  Right 1 Implanted     Indications for Surgery: 87 year old female who sustained a ground-level fall with a  right periprosthetic distal femur fracture.  Due to the unstable nature of her injury I recommend proceeding with open reduction internal fixation.  Risk and benefits were discussed with the patient and her daughter.  Risks included but not limited to bleeding, infection, malunion, nonunion, hardware failure, hardware irritation, nerve or blood vessel injury, DVT, even the possibility anesthetic complications.  He agreed to proceed with surgery and consent was obtained.  Operative Findings: Open reduction internal fixation of right periprosthetic distal femur fracture using [employer] NCB distal femoral locking plate.  Procedure: The patient was identified in the preoperative holding area. Consent was confirmed with the patient and their family and all questions were answered. The operative extremity was marked after confirmation with the patient. she was then brought back to the operating room by our anesthesia colleagues.  She was placed under general anesthetic and carefully transferred over to radiolucent flattop table.  A bump was placed under her operative hip.  The right lower extremity was then prepped and draped in usual sterile fashion.  A timeout was performed to verify the patient, the procedure, and the extremity.  Preoperative antibiotics were dosed.  Fluoroscopic imaging showed the unstable nature of her injury.  The hip and knee were flexed over a triangle and the fracture alignment was maintained.  A lateral approach to the distal femur was carried down through skin and subcutaneous tissue.  I incised through the IT band and mobilized the vastus lateralis to expose the lateral condyle of the femur.  I then attached a 15 hole [employer] NCB distal femoral locking plate to a targeting arm and slid this submuscularly along the lateral cortex of the femur.  I  used a 2.0 mm guidewire to hold the distal portion of the plate.  I then percutaneously placed a 3.3 mm drill bit in the shaft of  the femur just distal to the femoral hip prosthesis.  I once again confirmed alignment and then proceeded to place a 5.0 millimeter screws distally to bring the plate flush to bone.  I then percutaneously placed 5.0 millimeter screws into the femoral shaft to bring the proximal portion of the plate flush to bone and correct the coronal alignment.  I then proceeded to place 4.0 millimeter screws proximally getting bicortical fixation around the femoral stem.  I removed the 3.3 mm drill bit and placed a 4.0 millimeter screw.  Locking caps were placed on the most proximal screw as well as the 4.0 millimeter screw and most proximal 5.0 millimeter screw in the femoral shaft distal to the prosthesis.  I then returned to the distal segment and proceeded to place 3 more 5.0 millimeter screws.  Final fluoroscopic imaging was obtained.  The incision was copiously irrigated.  A gram of vancomycin powder was placed into the incision.  A layered closure of 0 Vicryl, 2-0 Vicryl and 3-0 Monocryl with Dermabond was used to close the skin.  Sterile dressing was applied.  The patient was then awoke from anesthesia and taken to the PACU in stable condition.  Post Op Plan/Instructions: The patient will be weightbearing as tolerated to the right lower extremity.  She will receive postoperative Ancef.  She will receive Lovenox for DVT prophylaxis and transition to an oral DOAC upon discharge.  Will have her mobilize with physical and Occupational Therapy.  I was present and performed the entire surgery.  Ulyses Southward, PA-C did assist me throughout the case. An assistant was necessary given the difficulty in approach, maintenance of reduction and ability to instrument the fracture.   Truitt Merle, MD [facility]

## 2022-11-27 NOTE — Progress Notes (Signed)
[facility] PLAN OF CARE NOTE Patient: Caitlin Mccarthy OZH:[id number]   PCP: Bennie Pierini, FNP DOB: 1935-10-04   DOA: 11/26/2022   DOS: 11/27/2022    Patient was admitted by my colleague earlier on 11/27/2022. I have reviewed the H&P as well as assessment and plan and agree with the same. Important changes in the plan are listed below.  Plan of care: Principal Problem:   Femur fracture, right (HCC) Active Problems:   GAD (generalized anxiety disorder)   Type 2 diabetes mellitus (HCC)   COPD (chronic obstructive pulmonary disease) (HCC)   Hyperlipidemia due to type 2 diabetes mellitus (HCC)   Fall at home, initial encounter   Chronic respiratory failure with hypoxia (HCC) Seen after the surgery. Transferred from [facility] pain present. No chest pain. No nausea no vomiting. Will recheck labs tomorrow.  Level of care: Med-Surg  Author: Lynden Oxford, MD [facility] 11/27/2022 4:29 PM   If 7PM-7AM, please contact night-coverage at [email or web address]

## 2022-11-27 NOTE — Plan of Care (Signed)
  Problem: Education: Goal: Knowledge of General Education information will improve Description Including pain rating scale, medication(s)/side effects and non-pharmacologic comfort measures Outcome: Progressing   Problem: Health Behavior/Discharge Planning: Goal: Ability to manage health-related needs will improve Outcome: Progressing   

## 2022-11-27 NOTE — TOC Initial Note (Signed)
Transition of Care [facility]) - Initial/Assessment Note    Patient Details  Name: Caitlin Mccarthy MRN: [id number] Date of Birth: 11/22/1935  [facility]) CM/SW Contact:    Howell Rucks, RN Phone Number: 11/27/2022, 11:11 AM  Clinical Narrative: patient to surgery today. TOC will continue to follow.                         Patient Goals and CMS Choice            Expected Discharge Plan and Services                                              Prior Living Arrangements/Services                       Activities of Daily Living Home Assistive Devices/Equipment: Eyeglasses, Bedside commode/3-in-1, Oxygen, Wheelchair ADL Screening (condition at time of admission) Patient's cognitive ability adequate to safely complete daily activities?: Yes Is the patient deaf or have difficulty hearing?: No Does the patient have difficulty seeing, even when wearing glasses/contacts?: No Does the patient have difficulty concentrating, remembering, or making decisions?: No Patient able to express need for assistance with ADLs?: Yes Does the patient have difficulty dressing or bathing?: No Independently performs ADLs?: Yes (appropriate for developmental age) Does the patient have difficulty walking or climbing stairs?: No Weakness of Legs: Both Weakness of Arms/Hands: Both  Permission Sought/Granted                  Emotional Assessment              Admission diagnosis:  Femur fracture, right (HCC) [S72.91XA] Patient Active Problem List   Diagnosis Date Noted   Fall at home, initial encounter 11/27/2022   Chronic respiratory failure with hypoxia (HCC) 11/27/2022   Femur fracture, right (HCC) 11/26/2022   Acute renal failure superimposed on stage 3a chronic kidney disease (HCC) 11/03/2022   Acute on chronic respiratory failure with hypoxia (HCC) 11/02/2022   Acute clinical systolic heart failure (HCC) 11/02/2022   COPD with acute  exacerbation (HCC) 11/02/2022   Overactive bladder 10/25/2019   Closed fracture of fourth metatarsal bone 01/05/2019   Closed fracture of third metatarsal bone 01/05/2019   Pain management contract signed 06/11/2018   History of right hip replacement 12/16/2017   Vocal cord nodule 01/11/2017   Late effect of cerebrovascular accident (CVA) 01/10/2017   OA (osteoarthritis) 01/09/2017   History of kidney stones 01/09/2017   COPD (chronic obstructive pulmonary disease) (HCC) 11/09/2016   Hyperlipidemia due to type 2 diabetes mellitus (HCC) 11/09/2016   Glaucoma 05/10/2016   Vitamin D deficiency 05/10/2016   Current smoker 05/10/2016   Osteoarthritis of both hands 05/10/2016   History of total right hip replacement 05/10/2016   Osteoporosis 05/09/2016   Type 2 diabetes mellitus (HCC) 02/05/2016   Overweight (BMI 25.0-29.9) 12/28/2015   GAD (generalized anxiety disorder) 12/28/2015   Hypokalemia 06/02/2015   Chronic pain 04/13/2015   Essential hypertension, benign 10/02/2012   GERD (gastroesophageal reflux disease) 10/02/2012   Depression 10/02/2012   OA (osteoarthritis) of hip 10/28/2011   PCP:  Bennie Pierini, FNP Pharmacy:   [facility] [place], [place] - [address] [place] [address] Phone: [phone] Fax: [phone]  Social Determinants of Health (SDOH) Social History: SDOH Screenings   Food Insecurity: No Food Insecurity (11/27/2022)  Housing: Low Risk  (11/27/2022)  Transportation Needs: No Transportation Needs (11/27/2022)  Utilities: Not At Risk (11/27/2022)  Alcohol Screen: Low Risk  (02/05/2022)  Depression (PHQ2-9): High Risk (11/18/2022)  Financial Resource Strain: Low Risk  (11/06/2022)  Physical Activity: Insufficiently Active (02/05/2022)  Social Connections: Moderately Integrated (02/05/2022)  Stress: No Stress Concern Present (02/05/2022)  Tobacco Use: Medium Risk (11/26/2022)   SDOH Interventions:     Readmission Risk  Interventions    11/05/2022    9:48 AM  Readmission Risk Prevention Plan  Transportation Screening Complete  PCP or Specialist Appt within 5-7 Days Not Complete  Home Care Screening Complete  Medication Review (RN CM) Complete

## 2022-11-27 NOTE — Assessment & Plan Note (Signed)
Continue Crestor and fenofibrate 

## 2022-11-27 NOTE — Consult Note (Signed)
Orthopaedic Trauma Service (OTS) Consult   Patient ID: Caitlin Mccarthy MRN: [id number] DOB/AGE: 85-Sep-1937 85 y.o.  Reason for Consult:Right periprosthetic distal femur fracture Referring Physician: Dr. Beverely Low, MD [facility]  HPI: Caitlin Mccarthy is an 87 y.o. female who is being seen in consultation at the request of Dr. Devonne Doughty for evaluation of right periprosthetic distal femur fracture.  The patient had a ground-level fall at home where she sustained a right periprosthetic distal femur fracture.  She was seen at [facility] emergency room and transferred to [facility].  Due to the complexity of her injury as well as or availability [facility] asked for assistance with managing her case.  Patient was seen and evaluated in preoperative holding area.  Currently comfortable.  States that she walks at baseline with a walker.  She lives at home with her husband but her 2 daughters are local and live only a few miles away.  She was recently admitted for an upper respiratory infection.  She still wears some home oxygen at night for this.  She otherwise has been doing okay.  She does have a history of diabetes but she does not take any insulin.  She only takes metformin.  Past Medical History:  Diagnosis Date   Anxiety    Depression    GERD (gastroesophageal reflux disease)    History of acute pyelonephritis    last episode 04-13-2015  w/ sepsis   History of kidney stones    History of recurrent UTIs    MULTIPLE   Hyperlipidemia    Hypertension    OA (osteoarthritis)    knees, hips, hands   Osteoporosis    Poor memory    especially when has uti   Renal calculus, left    Stroke (HCC)    Type 2 diabetes mellitus (HCC)    Vitamin D deficiency 05/10/2016    Past Surgical History:  Procedure Laterality Date   CATARACT EXTRACTION W/PHACO  06/06/2011   Procedure: CATARACT EXTRACTION PHACO AND INTRAOCULAR LENS PLACEMENT (IOC);  Surgeon: Gemma Payor;  Location: [facility];   Service: Ophthalmology;  Laterality: Right;  CDE=12.77   CATARACT EXTRACTION W/PHACO  06/27/2011   Procedure: CATARACT EXTRACTION PHACO AND INTRAOCULAR LENS PLACEMENT (IOC);  Surgeon: Gemma Payor;  Location: [facility];  Service: Ophthalmology;  Laterality: Left;  CDE:13.96   CYSTOSCOPY WITH URETEROSCOPY AND STENT PLACEMENT Right 04/27/2015   Procedure: CYSTOSCOPY WITH RIGHT URETEROSCOPY, BASKET REMOVAL OF STONE, REMOVAL OF RIGHT NEPHROSTOMY TUBE;  Surgeon: Bjorn Pippin, MD;  Location: [facility];  Service: Urology;  Laterality: Right;   CYSTOSCOPY/URETEROSCOPY/HOLMIUM LASER/STENT PLACEMENT Left 12/24/2016   Procedure: LEFT URETEROSCOPY WITH HOLMIUM LASER AND STENT PLACEMENT;  Surgeon: Bjorn Pippin, MD;  Location: [facility];  Service: Urology;  Laterality: Left;   EXTRACORPOREAL SHOCK WAVE LITHOTRIPSY  left 04-01-2016;  1980's   FRACTURE SURGERY     HIP ARTHROPLASTY     HOLMIUM LASER APPLICATION Right 04/27/2015   Procedure: HOLMIUM LASER APPLICATION;  Surgeon: Bjorn Pippin, MD;  Location: [facility];  Service: Urology;  Laterality: Right;   KNEE ARTHROPLASTY     KNEE ARTHROSCOPY Bilateral right 2005//  left ?   LOOP RECORDER INSERTION N/A 04/01/2017   Procedure: LOOP RECORDER INSERTION;  Surgeon: Hillis Range, MD;  Location: [facility];  Service: Cardiovascular;  Laterality: N/A;   TOTAL HIP ARTHROPLASTY  10/28/2011   Procedure: TOTAL HIP ARTHROPLASTY;  Surgeon: Loanne Drilling, MD;  Location: [facility];  Service: Orthopedics;  Laterality: Right;   TOTAL KNEE ARTHROPLASTY Bilateral left 03-09-2007//  right 2006   TRANSTHORACIC ECHOCARDIOGRAM  10/06/2006   normal echo,  ef 55-60%   TUBAL LIGATION      Family History  Problem Relation Age of Onset   Kidney disease Mother    Diabetes Mother    Congestive Heart Failure Father    Heart failure Father    Heart disease Brother    Alcohol abuse Brother    Anesthesia problems Neg Hx    Hypotension  Neg Hx    Malignant hyperthermia Neg Hx    Pseudochol deficiency Neg Hx     Social History:  reports that she quit smoking about 3 weeks ago. Her smoking use included cigarettes. She has a 10.50 pack-year smoking history. She has never used smokeless tobacco. She reports that she does not drink alcohol and does not use drugs.  Allergies: No Known Allergies  Medications:  No current facility-administered medications on file prior to encounter.   Current Outpatient Medications on File Prior to Encounter  Medication Sig Dispense Refill   amLODipine-benazepril (LOTREL) 10-40 MG capsule Take 1 capsule by mouth daily. 90 capsule 1   aspirin EC 81 MG tablet Take 1 tablet (81 mg total) by mouth daily.     Cholecalciferol 50 MCG (2000 UT) CAPS Take 1 capsule (2,000 Units total) by mouth daily with breakfast. 90 capsule 1   fenofibrate (TRICOR) 145 MG tablet Take 1 tablet (145 mg total) by mouth daily. 90 tablet 1   gabapentin (NEURONTIN) 100 MG capsule Take 1 capsule (100 mg total) by mouth 3 (three) times daily. 90 capsule 2   LORazepam (ATIVAN) 0.5 MG tablet Take 1 tablet (0.5 mg total) by mouth 2 (two) times daily as needed. for anxiety 30 tablet 5   metFORMIN (GLUCOPHAGE-XR) 500 MG 24 hr tablet Take 1 tablet (500 mg total) by mouth in the morning and at bedtime. 180 tablet 1   nystatin (MYCOSTATIN/NYSTOP) powder APPLY TOPICALLY FOUR TIMES DAILY (Patient taking differently: Apply 1 Application topically 4 (four) times daily.) 60 g 0   ondansetron (ZOFRAN-ODT) 4 MG disintegrating tablet TAKE 1 TABLET EVERY 8 HOURS AS NEEDED FOR NAUSEA AND VOMITING 20 tablet 3   OXYGEN Inhale 2 L into the lungs continuous as needed.     polyethylene glycol (MIRALAX / GLYCOLAX) 17 g packet Take 17 g by mouth daily.     PREMARIN vaginal cream Apply 1 application topically daily as needed (irritation).      rosuvastatin (CRESTOR) 40 MG tablet TAKE ONE (1) TABLET EACH DAY 90 tablet 1   traMADol (ULTRAM) 50 MG tablet  Take 2 tablets (100 mg total) by mouth 2 (two) times daily. TAKE ONE TABLET FOUR TIMES DAILY 60 tablet 5   Alcohol Swabs (B-D SINGLE USE SWABS REGULAR) PADS Test BS daily and as needed Dx E11.65 100 each 3   Blood Glucose Calibration (TRUE METRIX LEVEL 1) Low SOLN Use with glucose monitor Dx E11.65 3 each 0   Blood Glucose Monitoring Suppl (TRUE METRIX AIR GLUCOSE METER) w/Device KIT Test BS daily and as needed Dx E11.65 1 kit 0   glucose blood (TRUE METRIX BLOOD GLUCOSE TEST) test strip Test BS daily and as needed Dx E11.65 100 each 3   TRUEplus Lancets 33G MISC Test BS daily and as needed Dx E11.65 100 each 0     ROS: Constitutional: No fever or chills Vision: No changes in vision ENT: No difficulty swallowing  CV: No chest pain Pulm: No SOB or wheezing GI: No nausea or vomiting GU: No urgency or inability to hold urine Skin: No poor wound healing Neurologic: No numbness or tingling Psychiatric: No depression or anxiety Heme: No bruising Allergic: No reaction to medications or food   Exam: Blood pressure (!) 167/67, pulse 86, temperature 98.7 F (37.1 C), temperature source Oral, resp. rate 16, height 5\' 10"  (1.778 m), weight 99.8 kg, SpO2 96 %. General: No acute distress Orientation: Awake alert and oriented x 3 Mood and Affect: Cooperative and pleasant Gait: Unable to assess due to her fracture Coordination and balance: Within normal limits  Right lower extremity: Knee immobilizer in place.  Compartments are soft and compressible.  Previous scar from the wrist placement is healed.  No skin lesions noted on the lower extremity.  She has active dorsiflexion and plantarflexion of her foot and ankle.  She has sensation intact to light touch to her foot.  She has a warm well-perfused foot.  Left lower extremity: Skin without lesions. No tenderness to palpation. Full painless ROM, full strength in each muscle groups without evidence of instability.   Medical Decision  Making: Data: Imaging: X-rays of the right femur and right knee are reviewed which show a periprosthetic distal femur fracture with significant comminution.  Previous hip replacement is in place with no signs of any loosening of either prosthesis.  Labs:  Results for orders placed or performed during the hospital encounter of 11/26/22 (from the past 24 hour(s))  Basic metabolic panel     Status: Abnormal   Collection Time: 11/26/22  9:05 PM  Result Value Ref Range   Sodium 132 (L) 135 - 145 mmol/L   Potassium 3.7 3.5 - 5.1 mmol/L   Chloride 97 (L) 98 - 111 mmol/L   CO2 22 22 - 32 mmol/L   Glucose, Bld 224 (H) 70 - 99 mg/dL   BUN 14 8 - 23 mg/dL   Creatinine, Ser 1.61 0.44 - 1.00 mg/dL   Calcium 09.6 (H) 8.9 - 10.3 mg/dL   GFR, Estimated >04 >54 mL/min   Anion gap 13 5 - 15  CBC with Differential     Status: None   Collection Time: 11/26/22  9:05 PM  Result Value Ref Range   WBC 7.5 4.0 - 10.5 K/uL   RBC 4.16 3.87 - 5.11 MIL/uL   Hemoglobin 13.0 12.0 - 15.0 g/dL   HCT 09.8 11.9 - 14.7 %   MCV 96.4 80.0 - 100.0 fL   MCH 31.3 26.0 - 34.0 pg   MCHC 32.4 30.0 - 36.0 g/dL   RDW 82.9 56.2 - 13.0 %   Platelets 273 150 - 400 K/uL   nRBC 0.0 0.0 - 0.2 %   Neutrophils Relative % 78 %   Neutro Abs 5.8 1.7 - 7.7 K/uL   Lymphocytes Relative 17 %   Lymphs Abs 1.3 0.7 - 4.0 K/uL   Monocytes Relative 5 %   Monocytes Absolute 0.4 0.1 - 1.0 K/uL   Eosinophils Relative 0 %   Eosinophils Absolute 0.0 0.0 - 0.5 K/uL   Basophils Relative 0 %   Basophils Absolute 0.0 0.0 - 0.1 K/uL   Immature Granulocytes 0 %   Abs Immature Granulocytes 0.03 0.00 - 0.07 K/uL  Protime-INR     Status: None   Collection Time: 11/26/22  9:05 PM  Result Value Ref Range   Prothrombin Time 12.9 11.4 - 15.2 seconds   INR 1.0 0.8 - 1.2  Urinalysis, Routine w reflex microscopic -Urine, Clean Catch     Status: Abnormal   Collection Time: 11/27/22 12:50 AM  Result Value Ref Range   Color, Urine STRAW (A) YELLOW    APPearance CLEAR CLEAR   Specific Gravity, Urine 1.010 1.005 - 1.030   pH 6.0 5.0 - 8.0   Glucose, UA >=500 (A) NEGATIVE mg/dL   Hgb urine dipstick SMALL (A) NEGATIVE   Bilirubin Urine NEGATIVE NEGATIVE   Ketones, ur 5 (A) NEGATIVE mg/dL   Protein, ur 409 (A) NEGATIVE mg/dL   Nitrite NEGATIVE NEGATIVE   Leukocytes,Ua TRACE (A) NEGATIVE   RBC / HPF 0-5 0 - 5 RBC/hpf   WBC, UA 11-20 0 - 5 WBC/hpf   Bacteria, UA NONE SEEN NONE SEEN   Squamous Epithelial / HPF 0-5 0 - 5 /HPF   Budding Yeast PRESENT   Glucose, capillary     Status: Abnormal   Collection Time: 11/27/22  1:20 AM  Result Value Ref Range   Glucose-Capillary 194 (H) 70 - 99 mg/dL  Comprehensive metabolic panel     Status: Abnormal   Collection Time: 11/27/22  4:11 AM  Result Value Ref Range   Sodium 132 (L) 135 - 145 mmol/L   Potassium 3.9 3.5 - 5.1 mmol/L   Chloride 98 98 - 111 mmol/L   CO2 24 22 - 32 mmol/L   Glucose, Bld 189 (H) 70 - 99 mg/dL   BUN 12 8 - 23 mg/dL   Creatinine, Ser 8.11 0.44 - 1.00 mg/dL   Calcium 91.4 (H) 8.9 - 10.3 mg/dL   Total Protein 7.4 6.5 - 8.1 g/dL   Albumin 3.6 3.5 - 5.0 g/dL   AST 24 15 - 41 U/L   ALT 22 0 - 44 U/L   Alkaline Phosphatase 42 38 - 126 U/L   Total Bilirubin 0.7 0.3 - 1.2 mg/dL   GFR, Estimated >78 >29 mL/min   Anion gap 10 5 - 15  Magnesium     Status: None   Collection Time: 11/27/22  4:11 AM  Result Value Ref Range   Magnesium 1.8 1.7 - 2.4 mg/dL  CBC with Differential/Platelet     Status: None   Collection Time: 11/27/22  4:11 AM  Result Value Ref Range   WBC 7.0 4.0 - 10.5 K/uL   RBC 4.04 3.87 - 5.11 MIL/uL   Hemoglobin 12.7 12.0 - 15.0 g/dL   HCT 56.2 13.0 - 86.5 %   MCV 96.3 80.0 - 100.0 fL   MCH 31.4 26.0 - 34.0 pg   MCHC 32.6 30.0 - 36.0 g/dL   RDW 78.4 69.6 - 29.5 %   Platelets 283 150 - 400 K/uL   nRBC 0.0 0.0 - 0.2 %   Neutrophils Relative % 71 %   Neutro Abs 5.0 1.7 - 7.7 K/uL   Lymphocytes Relative 21 %   Lymphs Abs 1.5 0.7 - 4.0 K/uL    Monocytes Relative 8 %   Monocytes Absolute 0.6 0.1 - 1.0 K/uL   Eosinophils Relative 0 %   Eosinophils Absolute 0.0 0.0 - 0.5 K/uL   Basophils Relative 0 %   Basophils Absolute 0.0 0.0 - 0.1 K/uL   Immature Granulocytes 0 %   Abs Immature Granulocytes 0.02 0.00 - 0.07 K/uL  Glucose, capillary     Status: Abnormal   Collection Time: 11/27/22  7:43 AM  Result Value Ref Range   Glucose-Capillary 145 (H) 70 - 99 mg/dL  Glucose, capillary  Status: Abnormal   Collection Time: 11/27/22 11:12 AM  Result Value Ref Range   Glucose-Capillary 107 (H) 70 - 99 mg/dL     Imaging or Labs ordered: None  Medical history and chart was reviewed and case discussed with medical provider.  Assessment/Plan: 87 year old female with a right periprosthetic distal femur fracture between a hip prosthesis and total knee prosthesis  Due to the unstable nature of her injury I recommend proceeding with open reduction internal fixation.  Risk and benefits were discussed with the patient and her daughter.  Risks included but not limited to bleeding, infection, malunion, nonunion, hardware failure, hardware irritation, nerve or blood vessel injury, DVT, knee stiffness, even the possibility anesthetic complications.  They agreed to proceed with surgery and consent was obtained.  Roby Lofts, MD [facility] [phone] (office) [email or web address]

## 2022-11-27 NOTE — Consult Note (Signed)
Reason for Consult: Right knee pain Referring Physician: ADP/Hospitalist  Caitlin Mccarthy is an 87 y.o. female.  HPI: 87 yo female presents with right knee pain and deformity after mechanical fall at home. Patient has history of right total knee and hip from [place]. After fall transported to [facility] where XRAYs showed a displaced right periprosthetic distal femur fracture. She denies any other complaints or injuries from the fall.   Past Medical History:  Diagnosis Date   Anxiety    Depression    GERD (gastroesophageal reflux disease)    History of acute pyelonephritis    last episode 04-13-2015  w/ sepsis   History of kidney stones    History of recurrent UTIs    MULTIPLE   Hyperlipidemia    Hypertension    OA (osteoarthritis)    knees, hips, hands   Osteoporosis    Poor memory    especially when has uti   Renal calculus, left    Stroke (HCC)    Type 2 diabetes mellitus (HCC)    Vitamin D deficiency 05/10/2016    Past Surgical History:  Procedure Laterality Date   CATARACT EXTRACTION W/PHACO  06/06/2011   Procedure: CATARACT EXTRACTION PHACO AND INTRAOCULAR LENS PLACEMENT (IOC);  Surgeon: Gemma Payor;  Location: [facility];  Service: Ophthalmology;  Laterality: Right;  CDE=12.77   CATARACT EXTRACTION W/PHACO  06/27/2011   Procedure: CATARACT EXTRACTION PHACO AND INTRAOCULAR LENS PLACEMENT (IOC);  Surgeon: Gemma Payor;  Location: [facility];  Service: Ophthalmology;  Laterality: Left;  CDE:13.96   CYSTOSCOPY WITH URETEROSCOPY AND STENT PLACEMENT Right 04/27/2015   Procedure: CYSTOSCOPY WITH RIGHT URETEROSCOPY, BASKET REMOVAL OF STONE, REMOVAL OF RIGHT NEPHROSTOMY TUBE;  Surgeon: Bjorn Pippin, MD;  Location: [facility] [place];  Service: Urology;  Laterality: Right;   CYSTOSCOPY/URETEROSCOPY/HOLMIUM LASER/STENT PLACEMENT Left 12/24/2016   Procedure: LEFT URETEROSCOPY WITH HOLMIUM LASER AND STENT PLACEMENT;  Surgeon: Bjorn Pippin, MD;  Location: [facility];   Service: Urology;  Laterality: Left;   EXTRACORPOREAL SHOCK WAVE LITHOTRIPSY  left 04-01-2016;  1980's   FRACTURE SURGERY     HIP ARTHROPLASTY     HOLMIUM LASER APPLICATION Right 04/27/2015   Procedure: HOLMIUM LASER APPLICATION;  Surgeon: Bjorn Pippin, MD;  Location: [facility];  Service: Urology;  Laterality: Right;   KNEE ARTHROPLASTY     KNEE ARTHROSCOPY Bilateral right 2005//  left ?   LOOP RECORDER INSERTION N/A 04/01/2017   Procedure: LOOP RECORDER INSERTION;  Surgeon: Hillis Range, MD;  Location: [facility];  Service: Cardiovascular;  Laterality: N/A;   TOTAL HIP ARTHROPLASTY  10/28/2011   Procedure: TOTAL HIP ARTHROPLASTY;  Surgeon: Loanne Drilling, MD;  Location: [facility];  Service: Orthopedics;  Laterality: Right;   TOTAL KNEE ARTHROPLASTY Bilateral left 03-09-2007//  right 2006   TRANSTHORACIC ECHOCARDIOGRAM  10/06/2006   normal echo,  ef 55-60%   TUBAL LIGATION      Family History  Problem Relation Age of Onset   Kidney disease Mother    Diabetes Mother    Congestive Heart Failure Father    Heart failure Father    Heart disease Brother    Alcohol abuse Brother    Anesthesia problems Neg Hx    Hypotension Neg Hx    Malignant hyperthermia Neg Hx    Pseudochol deficiency Neg Hx     Social History:  reports that she quit smoking about 3 weeks ago. Her smoking use included cigarettes. She has a 10.50 pack-year  smoking history. She has never used smokeless tobacco. She reports that she does not drink alcohol and does not use drugs.  Allergies: No Active Allergies  Medications: I have reviewed the patient's current medications.  Results for orders placed or performed during the hospital encounter of 11/26/22 (from the past 48 hour(s))  Basic metabolic panel     Status: Abnormal   Collection Time: 11/26/22  9:05 PM  Result Value Ref Range   Sodium 132 (L) 135 - 145 mmol/L   Potassium 3.7 3.5 - 5.1 mmol/L   Chloride 97 (L) 98 - 111 mmol/L   CO2 22  22 - 32 mmol/L   Glucose, Bld 224 (H) 70 - 99 mg/dL    Comment: Glucose reference range applies only to samples taken after fasting for at least 8 hours.   BUN 14 8 - 23 mg/dL   Creatinine, Ser 1.61 0.44 - 1.00 mg/dL   Calcium 09.6 (H) 8.9 - 10.3 mg/dL   GFR, Estimated >04 >54 mL/min    Comment: (NOTE) Calculated using the CKD-EPI Creatinine Equation (2021)    Anion gap 13 5 - 15    Comment: Performed at [facility], [address] [place], [place] [address]  CBC with Differential     Status: None   Collection Time: 11/26/22  9:05 PM  Result Value Ref Range   WBC 7.5 4.0 - 10.5 K/uL   RBC 4.16 3.87 - 5.11 MIL/uL   Hemoglobin 13.0 12.0 - 15.0 g/dL   HCT 91.4 78.2 - 95.6 %   MCV 96.4 80.0 - 100.0 fL   MCH 31.3 26.0 - 34.0 pg   MCHC 32.4 30.0 - 36.0 g/dL   RDW 21.3 08.6 - 57.8 %   Platelets 273 150 - 400 K/uL   nRBC 0.0 0.0 - 0.2 %   Neutrophils Relative % 78 %   Neutro Abs 5.8 1.7 - 7.7 K/uL   Lymphocytes Relative 17 %   Lymphs Abs 1.3 0.7 - 4.0 K/uL   Monocytes Relative 5 %   Monocytes Absolute 0.4 0.1 - 1.0 K/uL   Eosinophils Relative 0 %   Eosinophils Absolute 0.0 0.0 - 0.5 K/uL   Basophils Relative 0 %   Basophils Absolute 0.0 0.0 - 0.1 K/uL   Immature Granulocytes 0 %   Abs Immature Granulocytes 0.03 0.00 - 0.07 K/uL    Comment: Performed at [facility], [address] [place], [place] [address]  Protime-INR     Status: None   Collection Time: 11/26/22  9:05 PM  Result Value Ref Range   Prothrombin Time 12.9 11.4 - 15.2 seconds   INR 1.0 0.8 - 1.2    Comment: (NOTE) INR goal varies based on device and disease states. Performed at [facility], [address], [place], [place] [address]   Urinalysis, Routine w reflex microscopic -Urine, Clean Catch     Status: Abnormal   Collection Time: 11/27/22 12:50 AM  Result Value Ref Range   Color, Urine STRAW (A) YELLOW   APPearance CLEAR CLEAR   Specific Gravity, Urine 1.010 1.005 - 1.030   pH 6.0 5.0 - 8.0   Glucose,  UA >=500 (A) NEGATIVE mg/dL   Hgb urine dipstick SMALL (A) NEGATIVE   Bilirubin Urine NEGATIVE NEGATIVE   Ketones, ur 5 (A) NEGATIVE mg/dL   Protein, ur 132 (A) NEGATIVE mg/dL   Nitrite NEGATIVE NEGATIVE   Leukocytes,Ua TRACE (A) NEGATIVE   RBC / HPF 0-5 0 - 5 RBC/hpf   WBC,  UA 11-20 0 - 5 WBC/hpf   Bacteria, UA NONE SEEN NONE SEEN   Squamous Epithelial / HPF 0-5 0 - 5 /HPF   Budding Yeast PRESENT     Comment: Performed at [facility], [address], [place], [place] [address]  Glucose, capillary     Status: Abnormal   Collection Time: 11/27/22  1:20 AM  Result Value Ref Range   Glucose-Capillary 194 (H) 70 - 99 mg/dL    Comment: Glucose reference range applies only to samples taken after fasting for at least 8 hours.  Comprehensive metabolic panel     Status: Abnormal   Collection Time: 11/27/22  4:11 AM  Result Value Ref Range   Sodium 132 (L) 135 - 145 mmol/L   Potassium 3.9 3.5 - 5.1 mmol/L   Chloride 98 98 - 111 mmol/L   CO2 24 22 - 32 mmol/L   Glucose, Bld 189 (H) 70 - 99 mg/dL    Comment: Glucose reference range applies only to samples taken after fasting for at least 8 hours.   BUN 12 8 - 23 mg/dL   Creatinine, Ser 6.04 0.44 - 1.00 mg/dL   Calcium 54.0 (H) 8.9 - 10.3 mg/dL   Total Protein 7.4 6.5 - 8.1 g/dL   Albumin 3.6 3.5 - 5.0 g/dL   AST 24 15 - 41 U/L   ALT 22 0 - 44 U/L   Alkaline Phosphatase 42 38 - 126 U/L   Total Bilirubin 0.7 0.3 - 1.2 mg/dL   GFR, Estimated >98 >11 mL/min    Comment: (NOTE) Calculated using the CKD-EPI Creatinine Equation (2021)    Anion gap 10 5 - 15    Comment: Performed at [facility], [address] [place], [place] [address]  Magnesium     Status: None   Collection Time: 11/27/22  4:11 AM  Result Value Ref Range   Magnesium 1.8 1.7 - 2.4 mg/dL    Comment: Performed at [facility], [address], [place], [place] [address]  CBC with Differential/Platelet     Status:  None   Collection Time: 11/27/22  4:11 AM  Result Value Ref Range   WBC 7.0 4.0 - 10.5 K/uL   RBC 4.04 3.87 - 5.11 MIL/uL   Hemoglobin 12.7 12.0 - 15.0 g/dL   HCT 13.0 86.5 - 78.4 %   MCV 96.3 80.0 - 100.0 fL   MCH 31.4 26.0 - 34.0 pg   MCHC 32.6 30.0 - 36.0 g/dL   RDW 69.6 29.5 - 28.4 %   Platelets 283 150 - 400 K/uL   nRBC 0.0 0.0 - 0.2 %   Neutrophils Relative % 71 %   Neutro Abs 5.0 1.7 - 7.7 K/uL   Lymphocytes Relative 21 %   Lymphs Abs 1.5 0.7 - 4.0 K/uL   Monocytes Relative 8 %   Monocytes Absolute 0.6 0.1 - 1.0 K/uL   Eosinophils Relative 0 %   Eosinophils Absolute 0.0 0.0 - 0.5 K/uL   Basophils Relative 0 %   Basophils Absolute 0.0 0.0 - 0.1 K/uL   Immature Granulocytes 0 %   Abs Immature Granulocytes 0.02 0.00 - 0.07 K/uL    Comment: Performed at [facility], [address], [place], [place] [address]    DG Tibia/Fibula Right  Result Date: 11/26/2022 CLINICAL DATA:  Status post fall. EXAM: RIGHT TIBIA AND FIBULA - 2 VIEW COMPARISON:  None Available. FINDINGS: The right tibia and right fibula are  intact. Acute fracture of the distal right femoral shaft is noted. An intact right knee replacement is seen. Medial and lateral soft tissue swelling is seen adjacent to the previously noted fracture site. IMPRESSION: 1. Acute fracture of the distal right femoral shaft. 2. Intact right knee replacement.  CT confirmation is recommended. Electronically Signed   By: Aram Candela M.D.   On: 11/26/2022 21:24   DG Knee Complete 4 Views Right  Result Date: 11/26/2022 CLINICAL DATA:  Status post fall. EXAM: RIGHT KNEE - COMPLETE 4+ VIEW COMPARISON:  None Available. FINDINGS: There is an acute fracture of the distal right femoral shaft, with approximately 1 shaft width posterior displacement of the distal fracture site. An intact right knee replacement is seen, without evidence of fracture involvement or dislocation. Soft tissue swelling is seen along the medial and  lateral aspects of the previously noted fracture site. IMPRESSION: 1. Acute fracture of the distal right femoral shaft. 2. Intact right knee replacement. Electronically Signed   By: Aram Candela M.D.   On: 11/26/2022 21:17   DG FEMUR, MIN 2 VIEWS RIGHT  Result Date: 11/26/2022 CLINICAL DATA:  Status post fall. EXAM: RIGHT FEMUR 2 VIEWS COMPARISON:  None Available. FINDINGS: There is an acute fracture of the distal right femoral shaft, with approximately 1 shaft width posterior displacement of the distal fracture site. Intact total right hip and right knee replacements are seen, without evidence of fracture involvement or dislocation. Soft tissue swelling is seen along the medial and lateral aspects of the previously noted fracture site. IMPRESSION: 1. Acute fracture of the distal right femoral shaft. 2. Intact total right hip and right knee replacements. Electronically Signed   By: Aram Candela M.D.   On: 11/26/2022 21:16   DG Chest 1 View  Result Date: 11/26/2022 CLINICAL DATA:  Status post fall. EXAM: CHEST  1 VIEW COMPARISON:  Nov 02, 2022 FINDINGS: The heart size and mediastinal contours are within normal limits. A radiopaque loop recorder device is seen. There is moderate severity calcification of the aortic arch and tortuosity of the descending thoracic aorta. Both lungs are clear. The visualized skeletal structures are unremarkable. IMPRESSION: No active disease. Electronically Signed   By: Aram Candela M.D.   On: 11/26/2022 21:13    Review of Systems Blood pressure (!) 161/84, pulse 77, temperature 97.8 F (36.6 C), temperature source Oral, resp. rate 17, height 5\' 10"  (1.778 m), weight 85.9 kg, SpO2 95 %. Physical Exam Patient is alert and oriented. No tenderness over the cervical, thoracic or lumbar spine. No tenderness and normal AROM bilateral UEs. NVI Right LE with knee immobilizer in place, normal pulses and sensation in the right foot. Minimal pain with AROM of the ankle.   Left hip and knee with pain free PROM. No deformity or swelling in the left LE  Assessment/Plan: 87 yo female with acute displaced distal femur peri-prosthetic fracture.  Will require operative intervention and stabilization.  Have discussed with Dr Despina Hick. I am reaching out to the ortho trauma team to see if/when they can get this fixed.  Please keep NPO for now for possible surgery later today.    Caitlin Mccarthy 11/27/2022, 7:03 AM

## 2022-11-27 NOTE — Anesthesia Procedure Notes (Signed)
Procedure Name: LMA Insertion Date/Time: 11/27/2022 1:40 PM  Performed by: Alwyn Ren, CRNAPre-anesthesia Checklist: Patient identified, Emergency Drugs available, Suction available and Patient being monitored Patient Re-evaluated:Patient Re-evaluated prior to induction Oxygen Delivery Method: Circle system utilized Preoxygenation: Pre-oxygenation with 100% oxygen Induction Type: IV induction LMA: LMA inserted LMA Size: 4.0 Number of attempts: 1

## 2022-11-27 NOTE — Assessment & Plan Note (Signed)
-   Continue Ativan 

## 2022-11-27 NOTE — H&P (View-Only) (Signed)
Orthopaedic Trauma Service (OTS) Consult   Patient ID: Caitlin Mccarthy MRN: [id number] DOB/AGE: 12/31/1935 87 y.o.  Reason for Consult:Right periprosthetic distal femur fracture Referring Physician: Dr. Steve Norris, MD [facility]  HPI: Caitlin Mccarthy is an 87 y.o. female who is being seen in consultation at the request of Dr. Noris for evaluation of right periprosthetic distal femur fracture.  The patient had a ground-level fall at home where she sustained a right periprosthetic distal femur fracture.  She was seen at [facility] emergency room and transferred to [place].  Due to the complexity of her injury as well as or availability [facility] asked for assistance with managing her case.  Patient was seen and evaluated in preoperative holding area.  Currently comfortable.  States that she walks at baseline with a walker.  She lives at home with her husband but her 2 daughters are local and live only a few miles away.  She was recently admitted for an upper respiratory infection.  She still wears some home oxygen at night for this.  She otherwise has been doing okay.  She does have a history of diabetes but she does not take any insulin.  She only takes metformin.  Past Medical History:  Diagnosis Date   Anxiety    Depression    GERD (gastroesophageal reflux disease)    History of acute pyelonephritis    last episode 04-13-2015  w/ sepsis   History of kidney stones    History of recurrent UTIs    MULTIPLE   Hyperlipidemia    Hypertension    OA (osteoarthritis)    knees, hips, hands   Osteoporosis    Poor memory    especially when has uti   Renal calculus, left    Stroke (HCC)    Type 2 diabetes mellitus (HCC)    Vitamin D deficiency 05/10/2016    Past Surgical History:  Procedure Laterality Date   CATARACT EXTRACTION W/PHACO  06/06/2011   Procedure: CATARACT EXTRACTION PHACO AND INTRAOCULAR LENS PLACEMENT (IOC);  Surgeon: Kerry Hunt;  Location: [facility];   Service: Ophthalmology;  Laterality: Right;  CDE=12.77   CATARACT EXTRACTION W/PHACO  06/27/2011   Procedure: CATARACT EXTRACTION PHACO AND INTRAOCULAR LENS PLACEMENT (IOC);  Surgeon: Kerry Hunt;  Location: [facility];  Service: Ophthalmology;  Laterality: Left;  CDE:13.96   CYSTOSCOPY WITH URETEROSCOPY AND STENT PLACEMENT Right 04/27/2015   Procedure: CYSTOSCOPY WITH RIGHT URETEROSCOPY, BASKET REMOVAL OF STONE, REMOVAL OF RIGHT NEPHROSTOMY TUBE;  Surgeon: John Wrenn, MD;  Location: [facility];  Service: Urology;  Laterality: Right;   CYSTOSCOPY/URETEROSCOPY/HOLMIUM LASER/STENT PLACEMENT Left 12/24/2016   Procedure: LEFT URETEROSCOPY WITH HOLMIUM LASER AND STENT PLACEMENT;  Surgeon: Wrenn, John, MD;  Location: [facility];  Service: Urology;  Laterality: Left;   EXTRACORPOREAL SHOCK WAVE LITHOTRIPSY  left 04-01-2016;  1980's   FRACTURE SURGERY     HIP ARTHROPLASTY     HOLMIUM LASER APPLICATION Right 04/27/2015   Procedure: HOLMIUM LASER APPLICATION;  Surgeon: John Wrenn, MD;  Location: [facility];  Service: Urology;  Laterality: Right;   KNEE ARTHROPLASTY     KNEE ARTHROSCOPY Bilateral right 2005//  left ?   LOOP RECORDER INSERTION N/A 04/01/2017   Procedure: LOOP RECORDER INSERTION;  Surgeon: Allred, James, MD;  Location: [facility];  Service: Cardiovascular;  Laterality: N/A;   TOTAL HIP ARTHROPLASTY  10/28/2011   Procedure: TOTAL HIP ARTHROPLASTY;  Surgeon: Frank V Aluisio, MD;  Location: [facility];    Service: Orthopedics;  Laterality: Right;   TOTAL KNEE ARTHROPLASTY Bilateral left 03-09-2007//  right 2006   TRANSTHORACIC ECHOCARDIOGRAM  10/06/2006   normal echo,  ef 55-60%   TUBAL LIGATION      Family History  Problem Relation Age of Onset   Kidney disease Mother    Diabetes Mother    Congestive Heart Failure Father    Heart failure Father    Heart disease Brother    Alcohol abuse Brother    Anesthesia problems Neg Hx    Hypotension  Neg Hx    Malignant hyperthermia Neg Hx    Pseudochol deficiency Neg Hx     Social History:  reports that she quit smoking about 3 weeks ago. Her smoking use included cigarettes. She has a 10.50 pack-year smoking history. She has never used smokeless tobacco. She reports that she does not drink alcohol and does not use drugs.  Allergies: No Known Allergies  Medications:  No current facility-administered medications on file prior to encounter.   Current Outpatient Medications on File Prior to Encounter  Medication Sig Dispense Refill   amLODipine-benazepril (LOTREL) 10-40 MG capsule Take 1 capsule by mouth daily. 90 capsule 1   aspirin EC 81 MG tablet Take 1 tablet (81 mg total) by mouth daily.     Cholecalciferol 50 MCG (2000 UT) CAPS Take 1 capsule (2,000 Units total) by mouth daily with breakfast. 90 capsule 1   fenofibrate (TRICOR) 145 MG tablet Take 1 tablet (145 mg total) by mouth daily. 90 tablet 1   gabapentin (NEURONTIN) 100 MG capsule Take 1 capsule (100 mg total) by mouth 3 (three) times daily. 90 capsule 2   LORazepam (ATIVAN) 0.5 MG tablet Take 1 tablet (0.5 mg total) by mouth 2 (two) times daily as needed. for anxiety 30 tablet 5   metFORMIN (GLUCOPHAGE-XR) 500 MG 24 hr tablet Take 1 tablet (500 mg total) by mouth in the morning and at bedtime. 180 tablet 1   nystatin (MYCOSTATIN/NYSTOP) powder APPLY TOPICALLY FOUR TIMES DAILY (Patient taking differently: Apply 1 Application topically 4 (four) times daily.) 60 g 0   ondansetron (ZOFRAN-ODT) 4 MG disintegrating tablet TAKE 1 TABLET EVERY 8 HOURS AS NEEDED FOR NAUSEA AND VOMITING 20 tablet 3   OXYGEN Inhale 2 L into the lungs continuous as needed.     polyethylene glycol (MIRALAX / GLYCOLAX) 17 g packet Take 17 g by mouth daily.     PREMARIN vaginal cream Apply 1 application topically daily as needed (irritation).      rosuvastatin (CRESTOR) 40 MG tablet TAKE ONE (1) TABLET EACH DAY 90 tablet 1   traMADol (ULTRAM) 50 MG tablet  Take 2 tablets (100 mg total) by mouth 2 (two) times daily. TAKE ONE TABLET FOUR TIMES DAILY 60 tablet 5   Alcohol Swabs (B-D SINGLE USE SWABS REGULAR) PADS Test BS daily and as needed Dx E11.65 100 each 3   Blood Glucose Calibration (TRUE METRIX LEVEL 1) Low SOLN Use with glucose monitor Dx E11.65 3 each 0   Blood Glucose Monitoring Suppl (TRUE METRIX AIR GLUCOSE METER) w/Device KIT Test BS daily and as needed Dx E11.65 1 kit 0   glucose blood (TRUE METRIX BLOOD GLUCOSE TEST) test strip Test BS daily and as needed Dx E11.65 100 each 3   TRUEplus Lancets 33G MISC Test BS daily and as needed Dx E11.65 100 each 0     ROS: Constitutional: No fever or chills Vision: No changes in vision ENT: No difficulty swallowing   CV: No chest pain Pulm: No SOB or wheezing GI: No nausea or vomiting GU: No urgency or inability to hold urine Skin: No poor wound healing Neurologic: No numbness or tingling Psychiatric: No depression or anxiety Heme: No bruising Allergic: No reaction to medications or food   Exam: Blood pressure (!) 167/67, pulse 86, temperature 98.7 F (37.1 C), temperature source Oral, resp. rate 16, height 5' 10" (1.778 m), weight 99.8 kg, SpO2 96 %. General: No acute distress Orientation: Awake alert and oriented x 3 Mood and Affect: Cooperative and pleasant Gait: Unable to assess due to her fracture Coordination and balance: Within normal limits  Right lower extremity: Knee immobilizer in place.  Compartments are soft and compressible.  Previous scar from the wrist placement is healed.  No skin lesions noted on the lower extremity.  She has active dorsiflexion and plantarflexion of her foot and ankle.  She has sensation intact to light touch to her foot.  She has a warm well-perfused foot.  Left lower extremity: Skin without lesions. No tenderness to palpation. Full painless ROM, full strength in each muscle groups without evidence of instability.   Medical Decision  Making: Data: Imaging: X-rays of the right femur and right knee are reviewed which show a periprosthetic distal femur fracture with significant comminution.  Previous hip replacement is in place with no signs of any loosening of either prosthesis.  Labs:  Results for orders placed or performed during the hospital encounter of 11/26/22 (from the past 24 hour(s))  Basic metabolic panel     Status: Abnormal   Collection Time: 11/26/22  9:05 PM  Result Value Ref Range   Sodium 132 (L) 135 - 145 mmol/L   Potassium 3.7 3.5 - 5.1 mmol/L   Chloride 97 (L) 98 - 111 mmol/L   CO2 22 22 - 32 mmol/L   Glucose, Bld 224 (H) 70 - 99 mg/dL   BUN 14 8 - 23 mg/dL   Creatinine, Ser 0.83 0.44 - 1.00 mg/dL   Calcium 11.0 (H) 8.9 - 10.3 mg/dL   GFR, Estimated >60 >60 mL/min   Anion gap 13 5 - 15  CBC with Differential     Status: None   Collection Time: 11/26/22  9:05 PM  Result Value Ref Range   WBC 7.5 4.0 - 10.5 K/uL   RBC 4.16 3.87 - 5.11 MIL/uL   Hemoglobin 13.0 12.0 - 15.0 g/dL   HCT 40.1 36.0 - 46.0 %   MCV 96.4 80.0 - 100.0 fL   MCH 31.3 26.0 - 34.0 pg   MCHC 32.4 30.0 - 36.0 g/dL   RDW 13.1 11.5 - 15.5 %   Platelets 273 150 - 400 K/uL   nRBC 0.0 0.0 - 0.2 %   Neutrophils Relative % 78 %   Neutro Abs 5.8 1.7 - 7.7 K/uL   Lymphocytes Relative 17 %   Lymphs Abs 1.3 0.7 - 4.0 K/uL   Monocytes Relative 5 %   Monocytes Absolute 0.4 0.1 - 1.0 K/uL   Eosinophils Relative 0 %   Eosinophils Absolute 0.0 0.0 - 0.5 K/uL   Basophils Relative 0 %   Basophils Absolute 0.0 0.0 - 0.1 K/uL   Immature Granulocytes 0 %   Abs Immature Granulocytes 0.03 0.00 - 0.07 K/uL  Protime-INR     Status: None   Collection Time: 11/26/22  9:05 PM  Result Value Ref Range   Prothrombin Time 12.9 11.4 - 15.2 seconds   INR 1.0 0.8 - 1.2    Urinalysis, Routine w reflex microscopic -Urine, Clean Catch     Status: Abnormal   Collection Time: 11/27/22 12:50 AM  Result Value Ref Range   Color, Urine STRAW (A) YELLOW    APPearance CLEAR CLEAR   Specific Gravity, Urine 1.010 1.005 - 1.030   pH 6.0 5.0 - 8.0   Glucose, UA >=500 (A) NEGATIVE mg/dL   Hgb urine dipstick SMALL (A) NEGATIVE   Bilirubin Urine NEGATIVE NEGATIVE   Ketones, ur 5 (A) NEGATIVE mg/dL   Protein, ur 100 (A) NEGATIVE mg/dL   Nitrite NEGATIVE NEGATIVE   Leukocytes,Ua TRACE (A) NEGATIVE   RBC / HPF 0-5 0 - 5 RBC/hpf   WBC, UA 11-20 0 - 5 WBC/hpf   Bacteria, UA NONE SEEN NONE SEEN   Squamous Epithelial / HPF 0-5 0 - 5 /HPF   Budding Yeast PRESENT   Glucose, capillary     Status: Abnormal   Collection Time: 11/27/22  1:20 AM  Result Value Ref Range   Glucose-Capillary 194 (H) 70 - 99 mg/dL  Comprehensive metabolic panel     Status: Abnormal   Collection Time: 11/27/22  4:11 AM  Result Value Ref Range   Sodium 132 (L) 135 - 145 mmol/L   Potassium 3.9 3.5 - 5.1 mmol/L   Chloride 98 98 - 111 mmol/L   CO2 24 22 - 32 mmol/L   Glucose, Bld 189 (H) 70 - 99 mg/dL   BUN 12 8 - 23 mg/dL   Creatinine, Ser 0.58 0.44 - 1.00 mg/dL   Calcium 10.7 (H) 8.9 - 10.3 mg/dL   Total Protein 7.4 6.5 - 8.1 g/dL   Albumin 3.6 3.5 - 5.0 g/dL   AST 24 15 - 41 U/L   ALT 22 0 - 44 U/L   Alkaline Phosphatase 42 38 - 126 U/L   Total Bilirubin 0.7 0.3 - 1.2 mg/dL   GFR, Estimated >60 >60 mL/min   Anion gap 10 5 - 15  Magnesium     Status: None   Collection Time: 11/27/22  4:11 AM  Result Value Ref Range   Magnesium 1.8 1.7 - 2.4 mg/dL  CBC with Differential/Platelet     Status: None   Collection Time: 11/27/22  4:11 AM  Result Value Ref Range   WBC 7.0 4.0 - 10.5 K/uL   RBC 4.04 3.87 - 5.11 MIL/uL   Hemoglobin 12.7 12.0 - 15.0 g/dL   HCT 38.9 36.0 - 46.0 %   MCV 96.3 80.0 - 100.0 fL   MCH 31.4 26.0 - 34.0 pg   MCHC 32.6 30.0 - 36.0 g/dL   RDW 12.8 11.5 - 15.5 %   Platelets 283 150 - 400 K/uL   nRBC 0.0 0.0 - 0.2 %   Neutrophils Relative % 71 %   Neutro Abs 5.0 1.7 - 7.7 K/uL   Lymphocytes Relative 21 %   Lymphs Abs 1.5 0.7 - 4.0 K/uL    Monocytes Relative 8 %   Monocytes Absolute 0.6 0.1 - 1.0 K/uL   Eosinophils Relative 0 %   Eosinophils Absolute 0.0 0.0 - 0.5 K/uL   Basophils Relative 0 %   Basophils Absolute 0.0 0.0 - 0.1 K/uL   Immature Granulocytes 0 %   Abs Immature Granulocytes 0.02 0.00 - 0.07 K/uL  Glucose, capillary     Status: Abnormal   Collection Time: 11/27/22  7:43 AM  Result Value Ref Range   Glucose-Capillary 145 (H) 70 - 99 mg/dL  Glucose, capillary       Status: Abnormal   Collection Time: 11/27/22 11:12 AM  Result Value Ref Range   Glucose-Capillary 107 (H) 70 - 99 mg/dL     Imaging or Labs ordered: None  Medical history and chart was reviewed and case discussed with medical provider.  Assessment/Plan: 86-year-old female with a right periprosthetic distal femur fracture between a hip prosthesis and total knee prosthesis  Due to the unstable nature of her injury I recommend proceeding with open reduction internal fixation.  Risk and benefits were discussed with the patient and her daughter.  Risks included but not limited to bleeding, infection, malunion, nonunion, hardware failure, hardware irritation, nerve or blood vessel injury, DVT, knee stiffness, even the possibility anesthetic complications.  They agreed to proceed with surgery and consent was obtained.  Quentina Fronek P. Sophya Vanblarcom, MD [facility] [phone] (office) [email or web address]   

## 2022-11-27 NOTE — Assessment & Plan Note (Signed)
-   Requiring oxygen since discharge from last hospitalization earlier in May - On baseline O2 supplementation of 2 L nasal cannula - No complaints of worsening dyspnea - Continue to monitor

## 2022-11-27 NOTE — Assessment & Plan Note (Signed)
-   Recently treated with steroids earlier this month - Continue Brovana and Pulmicort - Continue to monitor

## 2022-11-27 NOTE — Assessment & Plan Note (Signed)
-   Secondary to generalized weakness it has been present since the last hospitalization - Resulting distal femur fracture just proximal to the right knee replacement - Ortho consulted and recommended admission to [facility] - Plan for surgery in the a.m. - N.p.o. after midnight - Pain control with pain scale - Continue to monitor

## 2022-11-27 NOTE — Transfer of Care (Signed)
Immediate Anesthesia Transfer of Care Note  Patient: Caitlin Mccarthy  Procedure(s) Performed: OPEN REDUCTION INTERNAL FIXATION (ORIF) DISTAL FEMUR FRACTURE (Right: Leg Upper)  Patient Location: PACU  Anesthesia Type:General  Level of Consciousness: awake  Airway & Oxygen Therapy: Patient Spontanous Breathing and Patient connected to face mask oxygen  Post-op Assessment: Report given to RN and Post -op Vital signs reviewed and stable  Post vital signs: Reviewed and stable  Last Vitals:  Vitals Value Taken Time  BP 169/55 11/27/22 1502  Temp    Pulse 81 11/27/22 1506  Resp 23 11/27/22 1506  SpO2 95 % 11/27/22 1506  Vitals shown include unvalidated device data.  Last Pain:  Vitals:   11/27/22 1118  TempSrc:   PainSc: 9       Patients Stated Pain Goal: 2 (11/27/22 1610)  Complications: No notable events documented.

## 2022-11-27 NOTE — Assessment & Plan Note (Signed)
-   Secondary to fall - Imaging reads acute fracture of distal right femoral shaft intact right knee replacement - Ortho consulted and plans for surgery in the a.m. - N.p.o. after midnight - Pain management

## 2022-11-27 NOTE — Interval H&P Note (Signed)
History and Physical Interval Note:  11/27/2022 12:48 PM  Caitlin Mccarthy  has presented today for surgery, with the diagnosis of Rgiht distal femur fracture.  The various methods of treatment have been discussed with the patient and family. After consideration of risks, benefits and other options for treatment, the patient has consented to  Procedure(s): OPEN REDUCTION INTERNAL FIXATION (ORIF) DISTAL FEMUR FRACTURE (Right) as a surgical intervention.  The patient's history has been reviewed, patient examined, no change in status, stable for surgery.  I have reviewed the patient's chart and labs.  Questions were answered to the patient's satisfaction.     Caryn Bee P Zoei Amison

## 2022-11-28 ENCOUNTER — Encounter (HOSPITAL_COMMUNITY): Payer: Self-pay | Admitting: Student

## 2022-11-28 DIAGNOSIS — S72301A Unspecified fracture of shaft of right femur, initial encounter for closed fracture: Secondary | ICD-10-CM | POA: Diagnosis not present

## 2022-11-28 LAB — BASIC METABOLIC PANEL
Anion gap: 10 (ref 5–15)
BUN: 12 mg/dL (ref 8–23)
CO2: 25 mmol/L (ref 22–32)
Calcium: 10.3 mg/dL (ref 8.9–10.3)
Chloride: 100 mmol/L (ref 98–111)
Creatinine, Ser: 0.89 mg/dL (ref 0.44–1.00)
GFR, Estimated: >60 mL/min (ref >60)
Glucose, Bld: 190 mg/dL — ABNORMAL HIGH (ref 70–99)
Potassium: 4.6 mmol/L (ref 3.5–5.1)
Sodium: 135 mmol/L (ref 135–145)

## 2022-11-28 LAB — CBC
HCT: 33.8 % — ABNORMAL LOW (ref 36.0–46.0)
Hemoglobin: 10.9 g/dL — ABNORMAL LOW (ref 12.0–15.0)
MCH: 31.1 pg (ref 26.0–34.0)
MCHC: 32.2 g/dL (ref 30.0–36.0)
MCV: 96.3 fL (ref 80.0–100.0)
Platelets: 262 10*3/uL (ref 150–400)
RBC: 3.51 MIL/uL — ABNORMAL LOW (ref 3.87–5.11)
RDW: 13.2 % (ref 11.5–15.5)
WBC: 6.5 10*3/uL (ref 4.0–10.5)
nRBC: 0 % (ref 0.0–0.2)

## 2022-11-28 LAB — GLUCOSE, CAPILLARY
Glucose-Capillary: 158 mg/dL — ABNORMAL HIGH (ref 70–99)
Glucose-Capillary: 118 mg/dL — ABNORMAL HIGH (ref 70–99)
Glucose-Capillary: 121 mg/dL — ABNORMAL HIGH (ref 70–99)
Glucose-Capillary: 135 mg/dL — ABNORMAL HIGH (ref 70–99)

## 2022-11-28 LAB — CREATININE, URINE, 24 HOUR
Creatinine, 24H Ur: 649 mg/24 hr — ABNORMAL LOW (ref 800–1800)
Creatinine, Urine: 72.1 mg/dL

## 2022-11-28 LAB — CALCIUM, URINE, 24 HOUR
Calcium, 24H Urine: 147 mg/24 hr (ref 0–320)
Calcium, Urine: 14.7 mg/dL

## 2022-11-28 LAB — MAGNESIUM: Magnesium: 1.8 mg/dL (ref 1.7–2.4)

## 2022-11-28 MED ORDER — VITAMIN D (ERGOCALCIFEROL) 1.25 MG (50000 UNIT) PO CAPS
50000.0000 [IU] | ORAL_CAPSULE | ORAL | Status: DC
  Administered 2022-11-28: 50000 [IU] via ORAL
  Filled 2022-11-28: qty 1

## 2022-11-28 NOTE — Progress Notes (Signed)
[facility] Progress Note Patient: Caitlin Mccarthy UJW:[id number] DOB: 1936/06/01 DOA: 11/26/2022  DOS: the patient was seen and examined on 11/28/2022  Brief hospital course: PMH of HLD, HTN, type II DM, COPD, former smoker, GERD, anxiety, depression present to the hospital with a mechanical fall.  Found to have right femur fracture.  Initially presented t [facility].  Transferred to [facility] for evaluation by orthopedic trauma service.  SP ORIF right supracondylar distal femur fracture on 5/29. Assessment and Plan: Right supracondylar distal femur fracture. Management per orthopedics. Weightbearing per orthopedics. Pain management per orthopedics. SP ORIF on 5/29.  Postop acute blood loss anemia. Expected. Monitor H&H. No active bleeding or hematoma seen at the site.  History of COPD. Chronic respiratory failure with hypoxia on 2 LPM. Currently no evidence of exacerbation. Continue inhalers. Monitor.  Mechanical fall. PT OT evaluating.  HLD. Continue Crestor and fenofibrate.  Type 2 diabetes mellitus, uncontrolled with hyperglycemia, without long-term insulin use without any complication. Currently on sliding scale insulin.  Anxiety. On Ativan continue as needed.  Obesity Body mass index is 31.57 kg/m.  Placing the pt at higher risk of poor outcomes.   Subjective: No nausea no vomiting.  Has some cough.  No fever no chills.  Physical Exam: General: in Mild distress, No Rash Cardiovascular: S1 and S2 Present, No Murmur Respiratory: Good respiratory effort, Bilateral Air entry present. No Crackles, No wheezes Abdomen: Bowel Sound present, No tenderness Extremities: No edema Neuro: Alert and oriented x3, no new focal deficit  Data Reviewed: I have Reviewed nursing notes, Vitals, and Lab results. Since last encounter, pertinent lab results CBC and BMP   . I have ordered test including CBC and BMP  .   Disposition: Status is: Inpatient Remains  inpatient appropriate because: Need for stability of H&H  enoxaparin (LOVENOX) injection 40 mg Start: 11/28/22 0800 SCDs Start: 11/27/22 1642 SCDs Start: 11/27/22 0056   Family Communication: Family at bedside Level of care: Med-Surg   Vitals:   11/28/22 0452 11/28/22 0807 11/28/22 0838 11/28/22 1257  BP: 123/61  (!) 122/54 (!) 126/54  Pulse: 73 73  76  Resp: 16 18 19 20   Temp: 97.9 F (36.6 C)  (!) 97.4 F (36.3 C) 98.2 F (36.8 C)  TempSrc: Oral  Oral Oral  SpO2: 98% 98% 95% 98%  Weight:      Height:         Author: Lynden Oxford, MD 11/28/2022 5:43 PM  Please look on [email or web address] to find out who is on call.

## 2022-11-28 NOTE — Evaluation (Signed)
Physical Therapy Evaluation Patient Details Name: Caitlin Mccarthy MRN: [id number] DOB: 03-22-1936 Today's Date: 11/28/2022  History of Present Illness  87 y.o. female presenting 5/28 after a fall at home in which she sustained a periprosthetic femur fx. Now s/p ORIF on 5/29. PMH includes:  anxiety, depression, GERD, HLD, HTN, DM II, former smoker, COPD on 2 L O2 at baseline.   Clinical Impression  Pt in bed upon arrival of PT, agreeable to evaluation at this time. Prior to admission the pt was mobilizing at home with use of rollator, receiving assist from family for ADLs and mobility in the home. The pt now presents with limitations in functional mobility, strength, power, activity tolerance, and stability due to above dx and resulting pain, and will continue to benefit from skilled PT to address these deficits. The pt required maxA of 2 to complete bed mobility and to attempt sit-stand. She was limited by poor wt acceptance on RLE and limited strength in other extremities to maintain upright for >5-10 seconds. Will benefit from continued rehab acutely and after d/c to maximize functional recovery and return to independence.        Recommendations for follow up therapy are one component of a multi-disciplinary discharge planning process, led by the attending physician.  Recommendations may be updated based on patient status, additional functional criteria and insurance authorization.  Follow Up Recommendations Can patient physically be transported by private vehicle: No     Assistance Recommended at Discharge Frequent or constant Supervision/Assistance  Patient can return home with the following  Help with stairs or ramp for entrance;Two people to help with walking and/or transfers;Two people to help with bathing/dressing/bathroom;Assistance with cooking/housework;Assistance with feeding;Direct supervision/assist for medications management;Direct supervision/assist for financial  management;Assist for transportation    Equipment Recommendations None recommended by PT  Recommendations for Other Services       Functional Status Assessment Patient has had a recent decline in their functional status and demonstrates the ability to make significant improvements in function in a reasonable and predictable amount of time.     Precautions / Restrictions Precautions Precautions: Fall Restrictions Weight Bearing Restrictions: Yes RLE Weight Bearing: Weight bearing as tolerated      Mobility  Bed Mobility Overal bed mobility: Needs Assistance Bed Mobility: Supine to Sit, Sit to Supine     Supine to sit: Mod assist Sit to supine: +2 for physical assistance, Max assist   General bed mobility comments: assist for BLE management and trunk control upon return to supine    Transfers Overall transfer level: Needs assistance Equipment used: Rolling walker (2 wheels) Transfers: Sit to/from Stand Sit to Stand: +2 physical assistance, Max assist           General transfer comment: STS x2, pt needing max encouragement, increased time, and increased assist at hips to facilitate rise to standing    Ambulation/Gait               General Gait Details: pt unable to generate steps at EOB     Balance Overall balance assessment: Needs assistance Sitting-balance support: Feet supported, No upper extremity supported Sitting balance-Leahy Scale: Fair       Standing balance-Leahy Scale: Zero Standing balance comment: Reliant on external support and max A                             Pertinent Vitals/Pain Pain Assessment Pain Assessment: Faces Faces Pain Scale: Hurts whole lot  Pain Location: R leg Pain Descriptors / Indicators: Aching, Grimacing, Guarding, Shooting Pain Intervention(s): Limited activity within patient's tolerance, Monitored during session, Repositioned, Premedicated before session    Home Living Family/patient expects to be  discharged to:: Private residence Living Arrangements: Spouse/significant other Available Help at Discharge: Family;Available 24 hours/day Type of Home: House Home Access: Stairs to enter Entrance Stairs-Rails: None Entrance Stairs-Number of Steps: 1   Home Layout: One level Home Equipment: Agricultural consultant (2 wheels);Cane - single point;Shower seat;Grab bars - tub/shower;Hand held shower head Additional Comments: daughters live close by and visits every day, family will be putting grab bars by the toilet. Pt reports losing balance a lot but no falls in the last year    Prior Function Prior Level of Function : Needs assist         Mobility (physical): Bed mobility;Transfers;Gait;Stairs   Mobility Comments: household and short distanced community ambulator using Rollator ADLs Comments: Family assists with bathing and dressing as needed. Ind in medication management, enjoys cooking     Hand Dominance   Dominant Hand: Right    Extremity/Trunk Assessment   Upper Extremity Assessment Upper Extremity Assessment: Defer to OT evaluation    Lower Extremity Assessment Lower Extremity Assessment: RLE deficits/detail RLE Deficits / Details: limited by pain, limited ROM at knee and hip due to pt resisting in pain. good activation, poor functional use. RLE: Unable to fully assess due to pain;Unable to fully assess due to immobilization RLE Coordination: decreased fine motor;decreased gross motor    Cervical / Trunk Assessment Cervical / Trunk Assessment: Normal  Communication   Communication: No difficulties  Cognition Arousal/Alertness: Awake/alert Behavior During Therapy: WFL for tasks assessed/performed Overall Cognitive Status: Within Functional Limits for tasks assessed                                          General Comments General comments (skin integrity, edema, etc.): VSS on 2L, 90-95%    Exercises     Assessment/Plan    PT Assessment Patient  needs continued PT services  PT Problem List Decreased strength;Decreased range of motion;Decreased activity tolerance;Decreased balance;Decreased mobility;Decreased coordination;Decreased cognition;Pain       PT Treatment Interventions DME instruction;Gait training;Stair training;Functional mobility training;Therapeutic activities;Therapeutic exercise;Balance training;Patient/family education    PT Goals (Current goals can be found in the Care Plan section)  Acute Rehab PT Goals Patient Stated Goal: get back to cooking PT Goal Formulation: With patient/family Time For Goal Achievement: 12/12/22 Potential to Achieve Goals: Good    Frequency Min 3X/week     Co-evaluation               AM-PAC PT "6 Clicks" Mobility  Outcome Measure Help needed turning from your back to your side while in a flat bed without using bedrails?: Total Help needed moving from lying on your back to sitting on the side of a flat bed without using bedrails?: Total Help needed moving to and from a bed to a chair (including a wheelchair)?: Total Help needed standing up from a chair using your arms (e.g., wheelchair or bedside chair)?: Total Help needed to walk in hospital room?: Total Help needed climbing 3-5 steps with a railing? : Total 6 Click Score: 6    End of Session Equipment Utilized During Treatment: Oxygen Activity Tolerance: Patient tolerated treatment well;Patient limited by fatigue Patient left: in bed;with call bell/phone within reach;with family/visitor  present Nurse Communication: Mobility status PT Visit Diagnosis: Unsteadiness on feet (R26.81);Other abnormalities of gait and mobility (R26.89);Muscle weakness (generalized) (M62.81)    Time: 2130-8657 PT Time Calculation (min) (ACUTE ONLY): 38 min   Charges:   PT Evaluation $PT Eval Low Complexity: 1 Low PT Treatments $Therapeutic Exercise: 8-22 mins $Therapeutic Activity: 8-22 mins        Vickki Muff, PT, DPT   Acute  Rehabilitation Department Office [phone] Secure Chat Communication Preferred  Ronnie Derby 11/28/2022, 4:07 PM

## 2022-11-28 NOTE — Progress Notes (Signed)
Orthopaedic Trauma Progress Note  SUBJECTIVE: Doing fairly well this morning.  Pain controlled.  Required Morphine post-op yesterday but hasn't need much else for pain control since then. No chest pain. No SOB. No nausea/vomiting. No other complaints. Hasn't been up out of bed yet since surgery. Daughter at bedside  OBJECTIVE:  Vitals:   11/28/22 0002 11/28/22 0452  BP: (!) 126/59 123/61  Pulse: 72 73  Resp: 18 16  Temp: 98.2 F (36.8 C) 97.9 F (36.6 C)  SpO2: 97% 98%    General: Resting comfortably in bed.  No acute distress Respiratory: No increased work of breathing.  Right lower extremity: Dressings clean, dry, intact.  Tenderness around distal femur and knee as expected.  No significant calf tenderness.  Active ankle dorsiflexion/plantarflexion is limited but intact. Tolerates passive motion.  Patient endorses sensation to light touch over all aspects of the foot.  Compartments soft and compressible.  Toes warm and well-perfused. + DP pulse  IMAGING: Stable post op imaging.   LABS:  Results for orders placed or performed during the hospital encounter of 11/26/22 (from the past 24 hour(s))  Glucose, capillary     Status: Abnormal   Collection Time: 11/27/22  7:43 AM  Result Value Ref Range   Glucose-Capillary 145 (H) 70 - 99 mg/dL  Glucose, capillary     Status: Abnormal   Collection Time: 11/27/22 11:12 AM  Result Value Ref Range   Glucose-Capillary 107 (H) 70 - 99 mg/dL  Glucose, capillary     Status: Abnormal   Collection Time: 11/27/22  2:12 PM  Result Value Ref Range   Glucose-Capillary 152 (H) 70 - 99 mg/dL  Glucose, capillary     Status: Abnormal   Collection Time: 11/27/22  3:05 PM  Result Value Ref Range   Glucose-Capillary 138 (H) 70 - 99 mg/dL  CBC     Status: None   Collection Time: 11/27/22  4:53 PM  Result Value Ref Range   WBC 7.4 4.0 - 10.5 K/uL   RBC 3.93 3.87 - 5.11 MIL/uL   Hemoglobin 12.1 12.0 - 15.0 g/dL   HCT 16.1 09.6 - 04.5 %   MCV 95.7 80.0  - 100.0 fL   MCH 30.8 26.0 - 34.0 pg   MCHC 32.2 30.0 - 36.0 g/dL   RDW 40.9 81.1 - 91.4 %   Platelets 266 150 - 400 K/uL   nRBC 0.0 0.0 - 0.2 %  Creatinine, serum     Status: None   Collection Time: 11/27/22  4:53 PM  Result Value Ref Range   Creatinine, Ser 0.80 0.44 - 1.00 mg/dL   GFR, Estimated >78 >29 mL/min  Glucose, capillary     Status: Abnormal   Collection Time: 11/27/22  5:00 PM  Result Value Ref Range   Glucose-Capillary 190 (H) 70 - 99 mg/dL  Glucose, capillary     Status: Abnormal   Collection Time: 11/27/22  7:27 PM  Result Value Ref Range   Glucose-Capillary 277 (H) 70 - 99 mg/dL  CBC     Status: Abnormal   Collection Time: 11/28/22  1:39 AM  Result Value Ref Range   WBC 6.5 4.0 - 10.5 K/uL   RBC 3.51 (L) 3.87 - 5.11 MIL/uL   Hemoglobin 10.9 (L) 12.0 - 15.0 g/dL   HCT 56.2 (L) 13.0 - 86.5 %   MCV 96.3 80.0 - 100.0 fL   MCH 31.1 26.0 - 34.0 pg   MCHC 32.2 30.0 - 36.0 g/dL   RDW  13.2 11.5 - 15.5 %   Platelets 262 150 - 400 K/uL   nRBC 0.0 0.0 - 0.2 %  Basic metabolic panel     Status: Abnormal   Collection Time: 11/28/22  1:39 AM  Result Value Ref Range   Sodium 135 135 - 145 mmol/L   Potassium 4.6 3.5 - 5.1 mmol/L   Chloride 100 98 - 111 mmol/L   CO2 25 22 - 32 mmol/L   Glucose, Bld 190 (H) 70 - 99 mg/dL   BUN 12 8 - 23 mg/dL   Creatinine, Ser 3.24 0.44 - 1.00 mg/dL   Calcium 40.1 8.9 - 02.7 mg/dL   GFR, Estimated >25 >36 mL/min   Anion gap 10 5 - 15  Magnesium     Status: None   Collection Time: 11/28/22  1:39 AM  Result Value Ref Range   Magnesium 1.8 1.7 - 2.4 mg/dL    ASSESSMENT: Caitlin Mccarthy is a 87 y.o. female, 1 Day Post-Op s/p OPEN REDUCTION INTERNAL FIXATION RIGHT DISTAL FEMUR FRACTURE  CV/Blood loss: Acute blood loss anemia, Hgb 10.9 this morning. Hemodynamically stable  PLAN: Weightbearing: WBAT RLE ROM: Unrestricted ROM Incisional and dressing care: Reinforce dressings as needed  Showering: Okay to being getting incisions wet  11/30/2022 Orthopedic device(s): None  Pain management:  1. Tylenol 650 mg q 6 hours PRN 2. Robaxin 500 mg q 6 hours PRN 3. Oxycodone 5 mg q 4 hours PRN 4.  Morphine 2 mg q 2 hours PRN 5.  Gabapentin 100 mg 3 times daily VTE prophylaxis: Lovenox, SCDs ID:  Ancef 2gm post op Foley/Lines:  No foley, D/C IVFs today.  Impediments to Fracture Healing: Vitamin D level 16 when checked on 11/03/2022.  Started on supplementation. Dispo: PT/OT eval today, dispo pending.  Plan to remove dressings right lower extremity tomorrow 11/29/2022. D/c purewick after therapies today  D/C recommendations: -Oxycodone 5 mg for pain control -Eliquis 2.5 mg twice daily x 30 days for DVT prophylaxis -Continue 50,000 units Vit D supplementation q 7 days x 8 weeks  Follow - up plan: 2 weeks after discharge for wound check and repeat x-rays   Contact information:  Truitt Merle MD, Thyra Breed PA-C. After hours and holidays please check [email or web address] for group call information for [facility]   Thompson Caul, PA-C [phone] (office) [email or web address]

## 2022-11-28 NOTE — Evaluation (Signed)
Occupational Therapy Evaluation Patient Details Name: Caitlin Mccarthy MRN: [id number] DOB: [birth year] Today's Date: 11/28/2022   History of Present Illness 87 y.o. female who is being seen in consultation at the request of Dr. Devonne Doughty for evaluation of right periprosthetic distal femur fracture.  The patient had a ground-level fall at home where she sustained a right periprosthetic distal femur fracture. Past medical history significant of anxiety, depression, GERD, hyperlipidemia, hypertension, diabetes mellitus type 2, former smoker, COPD,   Clinical Impression   Pt admitted for above dx, PTA patient lived with spouse and reports having assist with bADLs/iADLs at baseline and ambulating short distances using Rollator. Pt currently presenting with increased pain in RLE with mobility, which impairs balance and ability to complete bADLs and functional transfers safely. Pt needing Max A +2 and unable to take steps independently, needing cues to shift hips anteriorly and to move BLEs. Pt also displays strong R lean when standing. Pt would benefit from continued acute skilled OT services to address above deficits and help transition to next level of care. Patient would benefit from post acute skilled rehab facility with <3 hours of therapy and 24/7 support but OT is hopeful patient will progress while in acute setting to reach [facility] level, will reassess functional capacities as patient progresses.      Recommendations for follow up therapy are one component of a multi-disciplinary discharge planning process, led by the attending physician.  Recommendations may be updated based on patient status, additional functional criteria and insurance authorization.   Assistance Recommended at Discharge Frequent or constant Supervision/Assistance  Patient can return home with the following Two people to help with walking and/or transfers;Assistance with cooking/housework;A lot of help with  bathing/dressing/bathroom;Assist for transportation;Help with stairs or ramp for entrance    Functional Status Assessment  Patient has had a recent decline in their functional status and demonstrates the ability to make significant improvements in function in a reasonable and predictable amount of time.  Equipment Recommendations  None recommended by OT (defer to next level of care)    Recommendations for Other Services       Precautions / Restrictions Precautions Precautions: Fall Restrictions Weight Bearing Restrictions: Yes RLE Weight Bearing: Weight bearing as tolerated      Mobility Bed Mobility Overal bed mobility: Needs Assistance Bed Mobility: Supine to Sit, Sit to Supine     Supine to sit: Mod assist Sit to supine: +2 for physical assistance, Max assist   General bed mobility comments: assist for BLE management and trunk control upon return to supine    Transfers Overall transfer level: Needs assistance Equipment used: Rolling walker (2 wheels) Transfers: Sit to/from Stand Sit to Stand: +2 physical assistance, Max assist                  Balance Overall balance assessment: Needs assistance Sitting-balance support: Feet supported, No upper extremity supported Sitting balance-Leahy Scale: Fair       Standing balance-Leahy Scale: Zero Standing balance comment: Reliant on external support and max A                           ADL either performed or assessed with clinical judgement   ADL   Eating/Feeding: Independent;Bed level   Grooming: Sitting;Set up;Min guard       Lower Body Bathing: Moderate assistance;Sitting/lateral leans       Lower Body Dressing: Maximal assistance;Sitting/lateral leans   Toilet Transfer: Maximal assistance;+2 for physical  assistance;Rolling walker (2 wheels);Squat-pivot       Tub/ Engineer, structural: Maximal assistance;+2 for physical assistance   Functional mobility during ADLs: Maximal assistance;+2  for physical assistance (Stood at bedside)       Financial controller      Pertinent Vitals/Pain Pain Assessment Pain Assessment: Faces Faces Pain Scale: Hurts whole lot Pain Location: R leg Pain Descriptors / Indicators: Aching, Grimacing, Guarding, Shooting Pain Intervention(s): Limited activity within patient's tolerance, Monitored during session, Repositioned     Hand Dominance Right   Extremity/Trunk Assessment Upper Extremity Assessment Upper Extremity Assessment: Generalized weakness   Lower Extremity Assessment Lower Extremity Assessment: Defer to PT evaluation RLE: Unable to fully assess due to pain;Unable to fully assess due to immobilization       Communication Communication Communication: No difficulties   Cognition Arousal/Alertness: Awake/alert Behavior During Therapy: WFL for tasks assessed/performed Overall Cognitive Status: Within Functional Limits for tasks assessed                                       General Comments  SP02 93% on RA, 91% with functional activity    Exercises     Shoulder Instructions      Home Living Family/patient expects to be discharged to:: Private residence Living Arrangements: Spouse/significant other Available Help at Discharge: Family;Available 24 hours/day Type of Home: House Home Access: Stairs to enter [employer] of Steps: 1 Entrance Stairs-Rails: None Home Layout: One level     Bathroom Shower/Tub: Producer, television/film/video:  (comfort height toilet) Bathroom Accessibility: Yes   Home Equipment: Agricultural consultant (2 wheels);Cane - single point;Shower seat;Grab bars - tub/shower;Hand held shower head   Additional Comments: daughters live close by and visits every day, family will be putting grab bars by the toilet. Pt reports losing balance a lot but no falls in the last year      Prior Functioning/Environment Prior Level of Function : Needs assist              Mobility Comments: household and short distanced community ambulator using Rollator ADLs Comments: Family assists with bathing and dressing as needed. Ind in medication management        OT Problem List: Impaired balance (sitting and/or standing);Decreased strength;Decreased activity tolerance;Pain      OT Treatment/Interventions: Self-care/ADL training;DME and/or AE instruction;Therapeutic exercise;Therapeutic activities;Patient/family education;Balance training    OT Goals(Current goals can be found in the care plan section) Acute Rehab OT Goals Patient Stated Goal: to get back home OT Goal Formulation: With patient/family Time For Goal Achievement: 12/12/22 Potential to Achieve Goals: Fair ADL Goals Pt Will Perform Grooming: standing;with min guard assist Pt Will Perform Lower Body Bathing: sit to/from stand;with set-up;with adaptive equipment Pt Will Perform Lower Body Dressing: sitting/lateral leans;with set-up;with supervision;with adaptive equipment Pt Will Transfer to Toilet: with min guard assist;stand pivot transfer;bedside commode  OT Frequency: Min 2X/week    Co-evaluation              AM-PAC OT "6 Clicks" Daily Activity     Outcome Measure Help from another person eating meals?: None Help from another person taking care of personal grooming?: A Little Help from another person toileting, which includes using toliet, bedpan, or urinal?: A Lot Help from another person bathing (including washing, rinsing, drying)?: A Lot Help from another  person to put on and taking off regular upper body clothing?: A Little Help from another person to put on and taking off regular lower body clothing?: A Lot 6 Click Score: 16   End of Session Equipment Utilized During Treatment: Gait belt;Rolling walker (2 wheels) Nurse Communication: Need for lift equipment;Weight bearing status (RLE WBAT)  Activity Tolerance: Patient limited by pain Patient left: in bed;with  call bell/phone within reach;with bed alarm set  OT Visit Diagnosis: Pain;Unsteadiness on feet (R26.81);Other abnormalities of gait and mobility (R26.89) Pain - Right/Left: Right Pain - part of body: Leg                Time: 1107-1210 OT Time Calculation (min): 63 min Charges:  OT General Charges $OT Visit: 1 Visit OT Evaluation $OT Eval Moderate Complexity: 1 Mod OT Treatments $Self Care/Home Management : 8-22 mins $Therapeutic Activity: 23-37 mins  11/28/2022  AB, OTR/L  Acute Rehabilitation Services  Office: [phone]   Tristan Schroeder 11/28/2022, 2:29 PM

## 2022-11-28 NOTE — Hospital Course (Signed)
PMH of HLD, HTN, type II DM, COPD, former smoker, GERD, anxiety, depression present to the hospital with a mechanical fall.  Found to have right femur fracture.  Initially presented t [facility].  Transferred to [facility] for evaluation by orthopedic trauma service.  SP ORIF right supracondylar distal femur fracture on 5/29.

## 2022-11-28 NOTE — Care Management Important Message (Signed)
Important Message  Patient Details  Name: Caitlin Mccarthy MRN: [id number] Date of Birth: [birth year]   Medicare Important Message Given:  Yes     Tesla Ruef 11/28/2022, 12:42 PM

## 2022-11-28 NOTE — Discharge Instructions (Addendum)
Orthopaedic Trauma Service Discharge Instructions   General Discharge Instructions  WEIGHT BEARING STATUS: Weightbearing right lower extremity  RANGE OF MOTION/ACTIVITY: ok for unrestricted range of motion  Wound Care: You may remove your surgical dressing on post-op day #2, (Friday 11/29/22). Incisions can be left open to air if there is no drainage. Once the incision is completely dry and without drainage, it may be left open to air out.  Showering may begin post-op day #3, (Saturday 11/30/22).  Clean incision gently with soap and water.  DVT/PE prophylaxis:  Eliquis 2.5 mg twice daily x 30 days  Diet: as you were eating previously.  Can use over the counter stool softeners and bowel preparations, such as Miralax, to help with bowel movements.  Narcotics can be constipating.  Be sure to drink plenty of fluids  PAIN MEDICATION USE AND EXPECTATIONS  You have likely been given narcotic medications to help control your pain.  After a traumatic event that results in an fracture (broken bone) with or without surgery, it is ok to use narcotic pain medications to help control one's pain.  We understand that everyone responds to pain differently and each individual patient will be evaluated on a regular basis for the continued need for narcotic medications. Ideally, narcotic medication use should last no more than 6-8 weeks (coinciding with fracture healing).   As a patient it is your responsibility as well to monitor narcotic medication use and report the amount and frequency you use these medications when you come to your office visit.   We would also advise that if you are using narcotic medications, you should take a dose prior to therapy to maximize you participation.  IF YOU ARE ON NARCOTIC MEDICATIONS IT IS NOT PERMISSIBLE TO OPERATE A MOTOR VEHICLE (MOTORCYCLE/CAR/TRUCK/MOPED) OR HEAVY MACHINERY DO NOT MIX NARCOTICS WITH OTHER CNS (CENTRAL NERVOUS SYSTEM) DEPRESSANTS SUCH AS ALCOHOL   STOP  SMOKING OR USING NICOTINE PRODUCTS!!!!  As discussed nicotine severely impairs your body's ability to heal surgical and traumatic wounds but also impairs bone healing.  Wounds and bone heal by forming microscopic blood vessels (angiogenesis) and nicotine is a vasoconstrictor (essentially, shrinks blood vessels).  Therefore, if vasoconstriction occurs to these microscopic blood vessels they essentially disappear and are unable to deliver necessary nutrients to the healing tissue.  This is one modifiable factor that you can do to dramatically increase your chances of healing your injury.    (This means no smoking, no nicotine gum, patches, etc)  DO NOT USE NONSTEROIDAL ANTI-INFLAMMATORY DRUGS (NSAID'S)  Using products such as Advil (ibuprofen), Aleve (naproxen), Motrin (ibuprofen) for additional pain control during fracture healing can delay and/or prevent the healing response.  If you would like to take over the counter (OTC) medication, Tylenol (acetaminophen) is ok.  However, some narcotic medications that are given for pain control contain acetaminophen as well. Therefore, you should not exceed more than 4000 mg of tylenol in a day if you do not have liver disease.  Also note that there are may OTC medicines, such as cold medicines and allergy medicines that my contain tylenol as well.  If you have any questions about medications and/or interactions please ask your doctor/PA or your pharmacist.      ICE AND ELEVATE INJURED/OPERATIVE EXTREMITY  Using ice and elevating the injured extremity above your heart can help with swelling and pain control.  Icing in a pulsatile fashion, such as 20 minutes on and 20 minutes off, can be followed.  Do not place ice directly on skin. Make sure there is a barrier between to skin and the ice pack.    Using frozen items such as frozen peas works well as the conform nicely to the are that needs to be iced.  USE AN ACE WRAP OR TED HOSE FOR SWELLING CONTROL  In  addition to icing and elevation, Ace wraps or TED hose are used to help limit and resolve swelling.  It is recommended to use Ace wraps or TED hose until you are informed to stop.    When using Ace Wraps start the wrapping distally (farthest away from the body) and wrap proximally (closer to the body)   Example: If you had surgery on your leg or thing and you do not have a splint on, start the ace wrap at the toes and work your way up to the thigh        If you had surgery on your upper extremity and do not have a splint on, start the ace wrap at your fingers and work your way up to the upper arm   CALL THE OFFICE WITH ANY QUESTIONS OR CONCERNS: [phone]   VISIT OUR WEBSITE FOR ADDITIONAL INFORMATION: [email or web address]    Discharge Wound Care Instructions  Do NOT apply any ointments, solutions or lotions to pin sites or surgical wounds.  These prevent needed drainage and even though solutions like hydrogen peroxide kill bacteria, they also damage cells lining the pin sites that help fight infection.  Applying lotions or ointments can keep the wounds moist and can cause them to breakdown and open up as well. This can increase the risk for infection. When in doubt call the office.   If any drainage is noted, use foam dressing.  - These dressing supplies should be available at local medical supply stores [facility], [facility], etc) as well as Insurance claims handler ([facility], [facility], [employer], etc)  Once the incision is completely dry and without drainage, it may be left open to air out.  Showering may begin 36-48 hours later.  Cleaning gently with soap and water.   Information on my medicine - ELIQUIS (apixaban)  This medication education was reviewed with me or my healthcare representative as part of my discharge preparation.   Why was Eliquis prescribed for you? Eliquis was prescribed for you to reduce the risk of blood clots forming after orthopedic surgery.    What do You need  to know about Eliquis? Take your Eliquis TWICE DAILY - one tablet in the morning and one tablet in the evening with or without food.  It would be best to take the dose about the same time each day.  If you have difficulty swallowing the tablet whole please discuss with your pharmacist how to take the medication safely.  Take Eliquis exactly as prescribed by your doctor and DO NOT stop taking Eliquis without talking to the doctor who prescribed the medication.  Stopping without other medication to take the place of Eliquis may increase your risk of developing a clot.  After discharge, you should have regular check-up appointments with your healthcare provider that is prescribing your Eliquis.  What do you do if you miss a dose? If a dose of ELIQUIS is not taken at the scheduled time, take it as soon as possible on the same day and twice-daily administration should be resumed.  The dose should not be doubled to make up for a missed dose.  Do not take more than one tablet  of ELIQUIS at the same time.  Important Safety Information A possible side effect of Eliquis is bleeding. You should call your healthcare provider right away if you experience any of the following: Bleeding from an injury or your nose that does not stop. Unusual colored urine (red or dark brown) or unusual colored stools (red or black). Unusual bruising for unknown reasons. A serious fall or if you hit your head (even if there is no bleeding).  Some medicines may interact with Eliquis and might increase your risk of bleeding or clotting while on Eliquis. To help avoid this, consult your healthcare provider or pharmacist prior to using any new prescription or non-prescription medications, including herbals, vitamins, non-steroidal anti-inflammatory drugs (NSAIDs) and supplements.  This website has more information on Eliquis (apixaban): http://www.eliquis.com/eliquis/home

## 2022-11-29 DIAGNOSIS — S72301A Unspecified fracture of shaft of right femur, initial encounter for closed fracture: Secondary | ICD-10-CM | POA: Diagnosis not present

## 2022-11-29 LAB — BASIC METABOLIC PANEL
Anion gap: 7 (ref 5–15)
BUN: 21 mg/dL (ref 8–23)
CO2: 27 mmol/L (ref 22–32)
Calcium: 10.4 mg/dL — ABNORMAL HIGH (ref 8.9–10.3)
Chloride: 102 mmol/L (ref 98–111)
Creatinine, Ser: 0.93 mg/dL (ref 0.44–1.00)
GFR, Estimated: >60 mL/min (ref >60)
Glucose, Bld: 162 mg/dL — ABNORMAL HIGH (ref 70–99)
Potassium: 4.4 mmol/L (ref 3.5–5.1)
Sodium: 136 mmol/L (ref 135–145)

## 2022-11-29 LAB — MAGNESIUM: Magnesium: 2 mg/dL (ref 1.7–2.4)

## 2022-11-29 LAB — GLUCOSE, CAPILLARY
Glucose-Capillary: 198 mg/dL — ABNORMAL HIGH (ref 70–99)
Glucose-Capillary: 134 mg/dL — ABNORMAL HIGH (ref 70–99)
Glucose-Capillary: 131 mg/dL — ABNORMAL HIGH (ref 70–99)
Glucose-Capillary: 158 mg/dL — ABNORMAL HIGH (ref 70–99)

## 2022-11-29 LAB — CBC
HCT: 33 % — ABNORMAL LOW (ref 36.0–46.0)
Hemoglobin: 10.6 g/dL — ABNORMAL LOW (ref 12.0–15.0)
MCH: 30.7 pg (ref 26.0–34.0)
MCHC: 32.1 g/dL (ref 30.0–36.0)
MCV: 95.7 fL (ref 80.0–100.0)
Platelets: 234 10*3/uL (ref 150–400)
RBC: 3.45 MIL/uL — ABNORMAL LOW (ref 3.87–5.11)
RDW: 13.5 % (ref 11.5–15.5)
WBC: 6.8 10*3/uL (ref 4.0–10.5)
nRBC: 0 % (ref 0.0–0.2)

## 2022-11-29 MED ORDER — MENTHOL 3 MG MT LOZG
1.0000 | LOZENGE | OROMUCOSAL | Status: DC | PRN
  Administered 2022-11-29: 3 mg via ORAL
  Filled 2022-11-29 (×2): qty 9

## 2022-11-29 MED ORDER — ACETAMINOPHEN 500 MG PO TABS: 500.0000 mg | ORAL_TABLET | Freq: Two times a day (BID) | ORAL | 0 refills | Status: DC

## 2022-11-29 MED ORDER — PHENOL 1.4 % MT LIQD: 1.0000 | OROMUCOSAL | Status: DC | PRN

## 2022-11-29 MED ORDER — METHOCARBAMOL 500 MG PO TABS: 500.0000 mg | ORAL_TABLET | Freq: Four times a day (QID) | ORAL | 0 refills | Status: DC | PRN

## 2022-11-29 MED ORDER — VITAMIN D (ERGOCALCIFEROL) 1.25 MG (50000 UNIT) PO CAPS: 50000.0000 [IU] | ORAL_CAPSULE | ORAL | 0 refills | Status: AC

## 2022-11-29 MED ORDER — SENNOSIDES-DOCUSATE SODIUM 8.6-50 MG PO TABS
1.0000 | ORAL_TABLET | Freq: Two times a day (BID) | ORAL | Status: DC
  Administered 2022-11-29 – 2022-12-03 (×6): 1 via ORAL
  Filled 2022-11-29 (×9): qty 1

## 2022-11-29 MED ORDER — DOCUSATE SODIUM 100 MG PO CAPS: 100.0000 mg | ORAL_CAPSULE | Freq: Two times a day (BID) | ORAL | 0 refills | Status: DC

## 2022-11-29 MED ORDER — APIXABAN 2.5 MG PO TABS: 2.5000 mg | ORAL_TABLET | Freq: Two times a day (BID) | ORAL | 0 refills | Status: DC

## 2022-11-29 MED ORDER — BENZONATATE 100 MG PO CAPS
100.0000 mg | ORAL_CAPSULE | Freq: Three times a day (TID) | ORAL | Status: DC
  Administered 2022-11-29 – 2022-12-03 (×6): 100 mg via ORAL
  Filled 2022-11-29 (×10): qty 1

## 2022-11-29 MED ORDER — BENZONATATE 100 MG PO CAPS: 100.0000 mg | ORAL_CAPSULE | Freq: Two times a day (BID) | ORAL | 0 refills | Status: DC | PRN

## 2022-11-29 MED ORDER — LORAZEPAM 0.5 MG PO TABS
0.5000 mg | ORAL_TABLET | Freq: Two times a day (BID) | ORAL | 0 refills | Status: DC | PRN
Start: 2022-11-29 — End: 2023-03-17

## 2022-11-29 MED ORDER — POLYETHYLENE GLYCOL 3350 17 G PO PACK
17.0000 g | PACK | Freq: Every day | ORAL | Status: DC
  Administered 2022-11-29 – 2022-12-03 (×5): 17 g via ORAL
  Filled 2022-11-29 (×5): qty 1

## 2022-11-29 NOTE — Progress Notes (Signed)
Occupational Therapy Treatment Patient Details Name: Caitlin Mccarthy MRN: [id number] DOB: 1935-10-23 Today's Date: 11/29/2022   History of present illness 87 y.o. female presenting 5/28 after a fall at home in which she sustained a periprosthetic femur fx. Now s/p ORIF on 5/29. PMH includes:  anxiety, depression, GERD, HLD, HTN, DM II, former smoker, COPD on 2 L O2 at baseline.   OT comments  Pt doing a little better with tolerating pain in today's OT session. Pt continues to need Mod-Max A +2 for transfers, unable to take full steps and drags RLE across floor to move it. Pt complete heel raises, unable to fully raiseR leg off floor during exercises. OT assisting with moving RLE during some exercises due to pt having difficulty moving it. Pt displayed some impaired command following up but does follow most single step commands, could be due to impaired hearing. OT to continue to progress pt as able, DC plans remain appropriate for SNF at this time.    Recommendations for follow up therapy are one component of a multi-disciplinary discharge planning process, led by the attending physician.  Recommendations may be updated based on patient status, additional functional criteria and insurance authorization.    Assistance Recommended at Discharge Frequent or constant Supervision/Assistance  Patient can return home with the following  Two people to help with walking and/or transfers;Assistance with cooking/housework;A lot of help with bathing/dressing/bathroom;Assist for transportation;Help with stairs or ramp for entrance   Equipment Recommendations  None recommended by OT (defer to next level of care)    Recommendations for Other Services      Precautions / Restrictions Precautions Precautions: Fall Restrictions Weight Bearing Restrictions: Yes RLE Weight Bearing: Weight bearing as tolerated       Mobility Bed Mobility Overal bed mobility: Needs Assistance Bed Mobility: Supine to Sit,  Sit to Supine     Supine to sit: Mod assist Sit to supine: +2 for physical assistance, Max assist   General bed mobility comments: assist for BLE management and trunk control upon return to supine    Transfers Overall transfer level: Needs assistance Equipment used: Rolling walker (2 wheels) Transfers: Sit to/from Stand Sit to Stand: +2 physical assistance, Max assist           General transfer comment: Stood twice, one STS Mod A +2 with RW. STS x1 Max A with OT bear hug and rocking to facilitate movement. Pt needing cues during each stand to shift hips anteriorly and cues to stand upright in midline     Balance Overall balance assessment: Needs assistance Sitting-balance support: Feet supported, No upper extremity supported Sitting balance-Leahy Scale: Fair     Standing balance support: During functional activity, Bilateral upper extremity supported Standing balance-Leahy Scale: Zero Standing balance comment: Reliant on external support and max A                           ADL either performed or assessed with clinical judgement   ADL                                       Functional mobility during ADLs: Maximal assistance;+2 for physical assistance (Stood at bedside) General ADL Comments: Focused session on progressing functional mobility, still limited by pain    Extremity/Trunk Assessment              Vision  Perception     Praxis      Cognition Arousal/Alertness: Awake/alert Behavior During Therapy: WFL for tasks assessed/performed Overall Cognitive Status: Impaired/Different from baseline Area of Impairment: Following commands                       Following Commands: Follows one step commands inconsistently                Exercises General Exercises - Lower Extremity Heel Raises: AROM, 10 reps, Right, Seated (x2 sets)    Shoulder Instructions       General Comments Pt daughter present,  supportive, and assisting as needed throughout OT session    Pertinent Vitals/ Pain       Pain Assessment Pain Assessment: 0-10 Pain Score: 4  Pain Location: R leg Pain Descriptors / Indicators: Aching, Grimacing, Guarding, Shooting Pain Intervention(s): Monitored during session, Repositioned, RN gave pain meds during session  Home Living                                          Prior Functioning/Environment              Frequency  Min 2X/week        Progress Toward Goals  OT Goals(current goals can now be found in the care plan section)  Progress towards OT goals: Progressing toward goals  Acute Rehab OT Goals Patient Stated Goal: to get back home OT Goal Formulation: With patient/family Time For Goal Achievement: 12/12/22 Potential to Achieve Goals: Fair  Plan Discharge plan remains appropriate;Frequency remains appropriate    Co-evaluation                 AM-PAC OT "6 Clicks" Daily Activity     Outcome Measure   Help from another person eating meals?: None Help from another person taking care of personal grooming?: A Little Help from another person toileting, which includes using toliet, bedpan, or urinal?: A Lot Help from another person bathing (including washing, rinsing, drying)?: A Lot Help from another person to put on and taking off regular upper body clothing?: A Little Help from another person to put on and taking off regular lower body clothing?: A Lot 6 Click Score: 16    End of Session Equipment Utilized During Treatment: Gait belt;Rolling walker (2 wheels)  OT Visit Diagnosis: Pain;Unsteadiness on feet (R26.81);Other abnormalities of gait and mobility (R26.89) Pain - Right/Left: Right Pain - part of body: Leg   Activity Tolerance Patient limited by pain   Patient Left in bed;with call bell/phone within reach;with family/visitor present;with nursing/sitter in room   Nurse Communication Need for lift equipment;Weight  bearing status (RLE WBAT)        Time: 1700-1737 OT Time Calculation (min): 37 min  Charges: OT General Charges $OT Visit: 1 Visit OT Treatments $Therapeutic Activity: 23-37 mins  11/29/2022  AB, OTR/L  Acute Rehabilitation Services  Office: [phone]   Tristan Schroeder 11/29/2022, 5:51 PM

## 2022-11-29 NOTE — Progress Notes (Signed)
0206: While rounding on pt to see if she wanted to use the bathroom, found the pt  bed wet and pt was incontinent of urine. According to pt daughter, pt is incontinent of urine at times. Peri care performed and changed the pt.

## 2022-11-29 NOTE — Progress Notes (Signed)
Orthopaedic Trauma Progress Note  SUBJECTIVE: Doing okay this morning.  Notes some increased pain in the right leg compared to yesterday.  Was slow to progress with therapies during sessions yesterday.  No chest pain. No SOB. No nausea/vomiting. No other complaints. Daughter at bedside.  She notes that she is interested in CIR for her mom at discharge.  We briefly discussed with the expectations of CIR noted that at patient's current level, she would likely not be able to tolerate the 3 hours of therapy.  Will continue to see how the patient progresses with acute therapies.  OBJECTIVE:  Vitals:   11/29/22 0515 11/29/22 0836  BP: (!) 147/67 (!) 135/47  Pulse: 89 87  Resp: 20 16  Temp: 98.4 F (36.9 C) 98.3 F (36.8 C)  SpO2: 92% 97%    General: Resting comfortably in bed.  No acute distress Respiratory: No increased work of breathing.  Right lower extremity: Dressings removed, incisions clean, dry, intact.  Tenderness around distal femur and knee as expected.  No significant calf tenderness.  Active ankle dorsiflexion/plantarflexion is limited but intact. Tolerates passive motion.  Patient endorses sensation to light touch over all aspects of the foot.  Compartments soft and compressible.  Toes warm and well-perfused. + DP pulse  IMAGING: Stable post op imaging.   LABS:  Results for orders placed or performed during the hospital encounter of 11/26/22 (from the past 24 hour(s))  Glucose, capillary     Status: Abnormal   Collection Time: 11/28/22 11:22 AM  Result Value Ref Range   Glucose-Capillary 118 (H) 70 - 99 mg/dL  Glucose, capillary     Status: Abnormal   Collection Time: 11/28/22  4:17 PM  Result Value Ref Range   Glucose-Capillary 121 (H) 70 - 99 mg/dL  Glucose, capillary     Status: Abnormal   Collection Time: 11/28/22  9:52 PM  Result Value Ref Range   Glucose-Capillary 135 (H) 70 - 99 mg/dL  CBC     Status: Abnormal   Collection Time: 11/29/22  3:08 AM  Result Value Ref  Range   WBC 6.8 4.0 - 10.5 K/uL   RBC 3.45 (L) 3.87 - 5.11 MIL/uL   Hemoglobin 10.6 (L) 12.0 - 15.0 g/dL   HCT 16.1 (L) 09.6 - 04.5 %   MCV 95.7 80.0 - 100.0 fL   MCH 30.7 26.0 - 34.0 pg   MCHC 32.1 30.0 - 36.0 g/dL   RDW 40.9 81.1 - 91.4 %   Platelets 234 150 - 400 K/uL   nRBC 0.0 0.0 - 0.2 %  Basic metabolic panel     Status: Abnormal   Collection Time: 11/29/22  3:08 AM  Result Value Ref Range   Sodium 136 135 - 145 mmol/L   Potassium 4.4 3.5 - 5.1 mmol/L   Chloride 102 98 - 111 mmol/L   CO2 27 22 - 32 mmol/L   Glucose, Bld 162 (H) 70 - 99 mg/dL   BUN 21 8 - 23 mg/dL   Creatinine, Ser 7.82 0.44 - 1.00 mg/dL   Calcium 95.6 (H) 8.9 - 10.3 mg/dL   GFR, Estimated >21 >30 mL/min   Anion gap 7 5 - 15  Magnesium     Status: None   Collection Time: 11/29/22  3:08 AM  Result Value Ref Range   Magnesium 2.0 1.7 - 2.4 mg/dL  Glucose, capillary     Status: Abnormal   Collection Time: 11/29/22  8:34 AM  Result Value Ref Range  Glucose-Capillary 198 (H) 70 - 99 mg/dL    ASSESSMENT: Caitlin Mccarthy is a 87 y.o. female, 2 Days Post-Op s/p OPEN REDUCTION INTERNAL FIXATION RIGHT DISTAL FEMUR FRACTURE  CV/Blood loss: Acute blood loss anemia, Hgb 10.9 this morning. Hemodynamically stable  PLAN: Weightbearing: WBAT RLE ROM: Unrestricted ROM Incisional and dressing care: Okay to leave incisions open to air.  Change Mepilex as needed Showering: Okay to being getting incisions wet 11/30/2022 if no drainage noted Orthopedic device(s): None  Pain management:  1. Tylenol 650 mg q 6 hours PRN 2. Robaxin 500 mg q 6 hours PRN 3. Oxycodone 5 mg q 4 hours PRN 4.  Morphine 2 mg q 2 hours PRN 5.  Gabapentin 100 mg 3 times daily VTE prophylaxis: Lovenox, SCDs ID:  Ancef 2gm post op completed Foley/Lines:  No foley, KVO IVFs  Impediments to Fracture Healing: Vitamin D level 16 when checked on 11/03/2022.  Started on supplementation. Dispo: PT/OT eval ongoing, patient may require SNF versus home  health.  D/C recommendations: -Oxycodone 5 mg for pain control -Eliquis 2.5 mg twice daily x 30 days for DVT prophylaxis -Continue 50,000 units Vit D supplementation q 7 days x 8 weeks  Follow - up plan: 2 weeks after discharge for wound check and repeat x-rays   Contact information:  Truitt Merle MD, Thyra Breed PA-C. After hours and holidays please check [email or web address] for group call information for [facility]   Thompson Caul, PA-C [phone] (office) [email or web address]

## 2022-11-29 NOTE — Progress Notes (Signed)
Inpatient Rehab Admissions Coordinator:   Per [facility] request pt was screened by Estill Dooms, PT, DPT.  At this time, pt does not have a diagnosis for CIR admission; [employer] will not approve that level of care for her rehab for her hip fracture.  No consult recommended at this time.   Estill Dooms, PT, DPT Admissions Coordinator [phone] 11/29/22  12:27 PM

## 2022-11-29 NOTE — NC FL2 (Signed)
Caitlin Mccarthy     IDENTIFICATION  Patient Name: Caitlin Mccarthy Birthdate: Aug 30, 1935 Sex: female Admission Date (Current Location): 11/26/2022  [facility] and [place] Number:  Producer, television/film/video and Address:  [facility] [place], [address] [place], [place] [address]      Provider Number: [id number]  Attending Physician Name and Address:  Rolly Salter, MD  Relative Name and Phone Number:  [facility] Daughter [phone]    Current Level of Care: Hospital Recommended Level of Care: Skilled Nursing Facility Prior Approval Number:    Date Approved/Denied:   PASRR Number:    Discharge Plan: SNF    Current Diagnoses: Patient Active Problem List   Diagnosis Date Noted   Fall at home, initial encounter 11/27/2022   Chronic respiratory failure with hypoxia (HCC) 11/27/2022   Femur fracture, right (HCC) 11/26/2022   Acute renal failure superimposed on stage 3a chronic kidney disease (HCC) 11/03/2022   Acute on chronic respiratory failure with hypoxia (HCC) 11/02/2022   Acute clinical systolic heart failure (HCC) 11/02/2022   COPD with acute exacerbation (HCC) 11/02/2022   Overactive bladder 10/25/2019   Closed fracture of fourth metatarsal bone 01/05/2019   Closed fracture of third metatarsal bone 01/05/2019   Pain management contract signed 06/11/2018   History of right hip replacement 12/16/2017   Vocal cord nodule 01/11/2017   Late effect of cerebrovascular accident (CVA) 01/10/2017   OA (osteoarthritis) 01/09/2017   History of kidney stones 01/09/2017   COPD (chronic obstructive pulmonary disease) (HCC) 11/09/2016   Hyperlipidemia due to type 2 diabetes mellitus (HCC) 11/09/2016   Glaucoma 05/10/2016   Vitamin D deficiency 05/10/2016   Current smoker 05/10/2016   Osteoarthritis of both hands 05/10/2016   History of total right hip replacement 05/10/2016   Osteoporosis 05/09/2016    Type 2 diabetes mellitus (HCC) 02/05/2016   Overweight (BMI 25.0-29.9) 12/28/2015   GAD (generalized anxiety disorder) 12/28/2015   Hypokalemia 06/02/2015   Chronic pain 04/13/2015   Essential hypertension, benign 10/02/2012   GERD (gastroesophageal reflux disease) 10/02/2012   Depression 10/02/2012   OA (osteoarthritis) of hip 10/28/2011    Orientation RESPIRATION BLADDER Height & Weight     Self, Time, Situation, Place  O2 Incontinent, External catheter Weight: 220 lb (99.8 kg) Height:  5\' 10"  (177.8 cm)  BEHAVIORAL SYMPTOMS/MOOD NEUROLOGICAL BOWEL NUTRITION STATUS      Continent Diet (see discharge summary)  AMBULATORY STATUS COMMUNICATION OF NEEDS Skin   Total Care Verbally Surgical wounds                       Personal Care Assistance Level of Assistance  Bathing, Feeding, Dressing Bathing Assistance: Limited assistance Feeding assistance: Limited assistance Dressing Assistance: Maximum assistance     Functional Limitations Info  Sight, Hearing, Speech Sight Info: Adequate Hearing Info: Impaired Speech Info: Adequate    SPECIAL CARE FACTORS FREQUENCY  PT (By licensed PT), OT (By licensed OT)     PT Frequency: 5x week OT Frequency: 5x week            Contractures Contractures Info: Not present    Additional Factors Info  Code Status, Allergies, Insulin Sliding Scale Code Status Info: DNR Allergies Info: NKA   Insulin Sliding Scale Info: Novolog: see discharge summary       Current Medications (11/29/2022):  This is the current hospital active medication list Current Facility-Administered Medications  Medication Dose Route  Frequency Provider Last Rate Last Admin   acetaminophen (TYLENOL) tablet 650 mg  650 mg Oral Q6H PRN [place] Bali, PA-C   650 mg at 11/28/22 1707   Or   acetaminophen (TYLENOL) suppository 650 mg  650 mg Rectal Q6H PRN [place] Bali, PA-C       amLODipine (NORVASC) tablet 10 mg  10 mg Oral Daily West Bali, PA-C    10 mg at 11/28/22 1610   arformoterol (BROVANA) nebulizer solution 15 mcg  15 mcg Nebulization BID [place] Bali, PA-C   15 mcg at 11/29/22 9604   aspirin EC tablet 81 mg  81 mg Oral Daily [place] Bali, PA-C   81 mg at 11/29/22 1004   benzonatate (TESSALON) capsule 100 mg  100 mg Oral TID Rolly Salter, MD   100 mg at 11/29/22 1004   budesonide (PULMICORT) nebulizer solution 0.25 mg  0.25 mg Nebulization BID West Bali, PA-C   0.25 mg at 11/29/22 0836   enoxaparin (LOVENOX) injection 40 mg  40 mg Subcutaneous Q24H [place] Bali, PA-C   40 mg at 11/29/22 1004   fenofibrate tablet 54 mg  54 mg Oral Daily West Bali, PA-C   54 mg at 11/29/22 1018   gabapentin (NEURONTIN) capsule 100 mg  100 mg Oral TID West Bali, PA-C   100 mg at 11/29/22 1004   insulin aspart (novoLOG) injection 0-15 Units  0-15 Units Subcutaneous TID WC West Bali, PA-C   3 Units at 11/29/22 1008   insulin aspart (novoLOG) injection 0-5 Units  0-5 Units Subcutaneous QHS [place] Bali, PA-C   2 Units at 11/27/22 2221   LORazepam (ATIVAN) tablet 0.5 mg  0.5 mg Oral BID PRN [place] Bali, PA-C       menthol-cetylpyridinium (CEPACOL) lozenge 3 mg  1 lozenge Oral PRN Rolly Salter, MD   3 mg at 11/29/22 1003   methocarbamol (ROBAXIN) tablet 500 mg  500 mg Oral Q6H PRN [place] B[place], PA-C   500 mg at 11/28/22 1707   Or   methocarbamol (ROBAXIN) 500 mg in dextrose 5 % 50 mL IVPB  500 mg Intravenous Q6H PRN Thyra Breed A, PA-C       morphine (PF) 2 MG/ML injection 2 mg  2 mg Intravenous Q2H PRN [place] Bali, PA-C   2 mg at 11/29/22 0521   ondansetron (ZOFRAN) tablet 4 mg  4 mg Oral Q6H PRN [place] Bali, PA-C       Or   ondansetron (ZOFRAN) injection 4 mg  4 mg Intravenous Q6H PRN Thyra Breed A, PA-C       oxyCODONE (Oxy IR/ROXICODONE) immediate release tablet 5 mg  5 mg Oral Q4H PRN Thyra Breed A, PA-C   5 mg at 11/29/22 1018   phenol (CHLORASEPTIC) mouth spray 1 spray  1  spray Mouth/Throat PRN Rolly Salter, MD       polyethylene glycol (MIRALAX / GLYCOLAX) packet 17 g  17 g Oral Daily Rolly Salter, MD   17 g at 11/29/22 1003   rosuvastatin (CRESTOR) tablet 40 mg  40 mg Oral Daily Thyra Breed A, PA-C   40 mg at 11/29/22 1004   senna-docusate (Senokot-S) tablet 1 tablet  1 tablet Oral BID Rolly Salter, MD   1 tablet at 11/29/22 1004   Vitamin D (Ergocalciferol) (DRISDOL) 1.25 MG (50000 UNIT) capsule 50,000 Units  50,000 Units Oral Q7 days Thyra Breed  A, PA-C   50,000 Units at 11/28/22 1318     Discharge Medications: Please see discharge summary for a list of discharge medications.  Relevant Imaging Results:  Relevant Lab Results:   Additional Information SSN: [id number]  Lorri Frederick, LCSW

## 2022-11-29 NOTE — Progress Notes (Signed)
RE:  Caitlin Mccarthy       Date of Birth: 05-Dec-2035      Date:   11/29/22       To Whom It May Concern:  Please be advised that the above-named patient will require a short-term nursing home stay - anticipated 30 days or less for rehabilitation and strengthening.  The plan is for return home.                 MD signature                Date

## 2022-11-29 NOTE — TOC Initial Note (Signed)
Transition of Care [facility]) - Initial/Assessment Note    Patient Details  Name: Caitlin Mccarthy MRN: [id number] Date of Birth: Nov 14, 1935  Transition of Care [facility]) CM/SW Contact:    Lorri Frederick, LCSW Phone Number: 11/29/2022, 11:51 AM  Clinical Narrative:  CSW met with pt and daughter April to discuss DC recommendations for SNF.  They requested pt be evaluated for CIR, CSW reached out to Caitlin/CIR, who reports pt is not a candidate.  CSW returned, daughter Lanice Schwab now also present, informed them that CIR not an option.  Discussed SNF options in [facility].  Pt lives with husband but both daughters are nearby to support.  They may see if pt makes progress with PT and attempt to DC straight home with [facility].  They did agree for CSW to send out SNF referral in case that is needed.  [facility] the SNF they would be interested in [facility], also would be interested in higher rated SNF in [place].  Medicare choice document provided for both areas.  CSW sent out referral in hub.                   Expected Discharge Plan: Skilled Nursing Facility Barriers to Discharge: Continued Medical Work up   Patient Goals and CMS Choice Patient states their goals for this hospitalization and ongoing recovery are:: walking CMS Medicare.gov Compare Post Acute Care list provided to:: Patient Represenative (must comment) (daughter Blanca and April) Choice offered to / list presented to : Adult Children      Expected Discharge Plan and Services In-house Referral: Clinical Social Work   Post Acute Care Choice:  (TBD) Living arrangements for the past 2 months: Single Family Home                                      Prior Living Arrangements/Services Living arrangements for the past 2 months: Single Family Home Lives with:: Spouse Patient language and need for interpreter reviewed:: Yes Do you feel safe going back to the place where you live?: Yes       Need for Family Participation in Patient Care: Yes (Comment) Care giver support system in place?: Yes (comment) Current home services: Other (comment) (none) Criminal Activity/Legal Involvement Pertinent to Current Situation/Hospitalization: No - Comment as needed  Activities of Daily Living Home Assistive Devices/Equipment: Eyeglasses, Bedside commode/3-in-1, Oxygen, Wheelchair ADL Screening (condition at time of admission) Patient's cognitive ability adequate to safely complete daily activities?: Yes Is the patient deaf or have difficulty hearing?: No Does the patient have difficulty seeing, even when wearing glasses/contacts?: No Does the patient have difficulty concentrating, remembering, or making decisions?: No Patient able to express need for assistance with ADLs?: Yes Does the patient have difficulty dressing or bathing?: No Independently performs ADLs?: Yes (appropriate for developmental age) Does the patient have difficulty walking or climbing stairs?: No Weakness of Legs: Both Weakness of Arms/Hands: Both  Permission Sought/Granted Permission sought to share information with : Family Supports Permission granted to share information with : Yes, Verbal Permission Granted  Share Information with NAME: daughters [place] and April  Permission granted to share info w AGENCY: SNF        Emotional Assessment Appearance:: Appears stated age Attitude/Demeanor/Rapport: Engaged Affect (typically observed): Pleasant Orientation: : Oriented to Self, Oriented to Place, Oriented to  Time, Oriented to Situation      Admission  diagnosis:  Femur fracture, right (HCC) [S72.91XA] Patient Active Problem List   Diagnosis Date Noted   Fall at home, initial encounter 11/27/2022   Chronic respiratory failure with hypoxia (HCC) 11/27/2022   Femur fracture, right (HCC) 11/26/2022   Acute renal failure superimposed on stage 3a chronic kidney disease (HCC) 11/03/2022   Acute on chronic  respiratory failure with hypoxia (HCC) 11/02/2022   Acute clinical systolic heart failure (HCC) 11/02/2022   COPD with acute exacerbation (HCC) 11/02/2022   Overactive bladder 10/25/2019   Closed fracture of fourth metatarsal bone 01/05/2019   Closed fracture of third metatarsal bone 01/05/2019   Pain management contract signed 06/11/2018   History of right hip replacement 12/16/2017   Vocal cord nodule 01/11/2017   Late effect of cerebrovascular accident (CVA) 01/10/2017   OA (osteoarthritis) 01/09/2017   History of kidney stones 01/09/2017   COPD (chronic obstructive pulmonary disease) (HCC) 11/09/2016   Hyperlipidemia due to type 2 diabetes mellitus (HCC) 11/09/2016   Glaucoma 05/10/2016   Vitamin D deficiency 05/10/2016   Current smoker 05/10/2016   Osteoarthritis of both hands 05/10/2016   History of total right hip replacement 05/10/2016   Osteoporosis 05/09/2016   Type 2 diabetes mellitus (HCC) 02/05/2016   Overweight (BMI 25.0-29.9) 12/28/2015   GAD (generalized anxiety disorder) 12/28/2015   Hypokalemia 06/02/2015   Chronic pain 04/13/2015   Essential hypertension, benign 10/02/2012   GERD (gastroesophageal reflux disease) 10/02/2012   Depression 10/02/2012   OA (osteoarthritis) of hip 10/28/2011   PCP:  Bennie Pierini, FNP Pharmacy:   [facility] [place] [place], [place] - [address] [place] [address] Phone: [phone] Fax: [phone]     Social Determinants of Health (SDOH) Social History: SDOH Screenings   Food Insecurity: No Food Insecurity (11/27/2022)  Housing: Low Risk  (11/27/2022)  Transportation Needs: No Transportation Needs (11/27/2022)  Utilities: Not At Risk (11/27/2022)  Alcohol Screen: Low Risk  (02/05/2022)  Depression (PHQ2-9): High Risk (11/18/2022)  Financial Resource Strain: Low Risk  (11/06/2022)  Physical Activity: Insufficiently Active (02/05/2022)  Social Connections: Moderately Integrated (02/05/2022)   Stress: No Stress Concern Present (02/05/2022)  Tobacco Use: Medium Risk (11/28/2022)   SDOH Interventions:     Readmission Risk Interventions    11/05/2022    9:48 AM  Readmission Risk Prevention Plan  Transportation Screening Complete  PCP or Specialist Appt within 5-7 Days Not Complete  Home Care Screening Complete  Medication Review (RN CM) Complete

## 2022-11-29 NOTE — Care Management Important Message (Signed)
Important Message  Patient Details  Name: Caitlin Mccarthy MRN: [id number] Date of Birth: Oct 07, 1935   Medicare Important Message Given:  Yes     [name] 11/29/2022, 3:16 PM

## 2022-11-29 NOTE — Progress Notes (Signed)
[facility] Progress Note Patient: Caitlin Mccarthy NFA:[id number] DOB: 08/24/1935 DOA: 11/26/2022  DOS: the patient was seen and examined on 11/29/2022  Brief hospital course: PMH of HLD, HTN, type II DM, COPD, former smoker, GERD, anxiety, depression present to the hospital with a mechanical fall.  Found to have right femur fracture.  Initially presented t [facility]  Transferred to [facility] for evaluation by orthopedic trauma service.  SP ORIF right supracondylar distal femur fracture on 5/29. Assessment and Plan: Right supracondylar distal femur fracture. Management per orthopedics. Weightbearing per orthopedics. Pain management per orthopedics. SP ORIF on 5/29.  Postop acute blood loss anemia. Expected. Monitor H&H.  Currently relatively stable. No active bleeding or hematoma seen at the site.  History of COPD. Chronic respiratory failure with hypoxia on 2 LPM. Currently no evidence of exacerbation. Continue inhalers. Monitor.  Mechanical fall. PT OT evaluating.  HLD. Continue Crestor and fenofibrate.  Type 2 diabetes mellitus, uncontrolled with hyperglycemia, without long-term insulin use without any complication. Currently on sliding scale insulin.  Anxiety. On Ativan continue as needed.  Obesity Body mass index is 31.57 kg/m.  Placing the pt at higher risk of poor outcomes.   Subjective: No acute complaint.  No nausea no vomiting no fever no chills.  Physical Exam: In mild distress. No rash. S1-S2 present. Clear to auscultation. Was not present. No edema.  Data Reviewed: I have Reviewed nursing notes, Vitals, and Lab results. Reviewed CBC and BMP.  Disposition: Status is: Inpatient Remains inpatient appropriate because: N awaiting SNF placement.  enoxaparin (LOVENOX) injection 40 mg Start: 11/28/22 0800 SCDs Start: 11/27/22 1642 SCDs Start: 11/27/22 0056   Family Communication: Family at bedside Level of care: Med-Surg   Vitals:    11/28/22 2058 11/29/22 0515 11/29/22 0836 11/29/22 1512  BP: (!) 110/51 (!) 147/67 (!) 135/47 138/85  Pulse: (!) 54 89 87 97  Resp: 17 20 16 18   Temp: 98.7 F (37.1 C) 98.4 F (36.9 C) 98.3 F (36.8 C) 98.5 F (36.9 C)  TempSrc: Oral Oral Oral Oral  SpO2: 95% 92% 97% 97%  Weight:      Height:         Author: Lynden Oxford, MD 11/29/2022 5:37 PM  Please look on [email or web address] to find out who is on call.

## 2022-11-30 DIAGNOSIS — S72301A Unspecified fracture of shaft of right femur, initial encounter for closed fracture: Secondary | ICD-10-CM | POA: Diagnosis not present

## 2022-11-30 LAB — URINALYSIS, ROUTINE W REFLEX MICROSCOPIC
Bilirubin Urine: NEGATIVE
Glucose, UA: NEGATIVE mg/dL
Ketones, ur: NEGATIVE mg/dL
Nitrite: NEGATIVE
Protein, ur: 30 mg/dL — AB
Specific Gravity, Urine: 1.013 (ref 1.005–1.030)
WBC, UA: >50 WBC/hpf (ref 0–5)
pH: 6 (ref 5.0–8.0)

## 2022-11-30 LAB — BASIC METABOLIC PANEL
Anion gap: 8 (ref 5–15)
Anion gap: 6 (ref 5–15)
BUN: 26 mg/dL — ABNORMAL HIGH (ref 8–23)
BUN: 33 mg/dL — ABNORMAL HIGH (ref 8–23)
CO2: 25 mmol/L (ref 22–32)
CO2: 28 mmol/L (ref 22–32)
Calcium: 10.5 mg/dL — ABNORMAL HIGH (ref 8.9–10.3)
Calcium: 10.6 mg/dL — ABNORMAL HIGH (ref 8.9–10.3)
Chloride: 101 mmol/L (ref 98–111)
Chloride: 101 mmol/L (ref 98–111)
Creatinine, Ser: 0.95 mg/dL (ref 0.44–1.00)
Creatinine, Ser: 0.96 mg/dL (ref 0.44–1.00)
GFR, Estimated: 59 mL/min — ABNORMAL LOW (ref >60)
GFR, Estimated: 58 mL/min — ABNORMAL LOW (ref >60)
Glucose, Bld: 176 mg/dL — ABNORMAL HIGH (ref 70–99)
Glucose, Bld: 196 mg/dL — ABNORMAL HIGH (ref 70–99)
Potassium: 4.5 mmol/L (ref 3.5–5.1)
Potassium: 4.3 mmol/L (ref 3.5–5.1)
Sodium: 134 mmol/L — ABNORMAL LOW (ref 135–145)
Sodium: 135 mmol/L (ref 135–145)

## 2022-11-30 LAB — BLOOD GAS, VENOUS
Acid-Base Excess: 5 mmol/L — ABNORMAL HIGH (ref 0.0–2.0)
Bicarbonate: 29.9 mmol/L — ABNORMAL HIGH (ref 20.0–28.0)
O2 Saturation: 94.8 %
Patient temperature: 36.8
pCO2, Ven: 44 mmHg (ref 44–60)
pH, Ven: 7.44 — ABNORMAL HIGH (ref 7.25–7.43)
pO2, Ven: 63 mmHg — ABNORMAL HIGH (ref 32–45)

## 2022-11-30 LAB — GLUCOSE, CAPILLARY
Glucose-Capillary: 114 mg/dL — ABNORMAL HIGH (ref 70–99)
Glucose-Capillary: 160 mg/dL — ABNORMAL HIGH (ref 70–99)
Glucose-Capillary: 161 mg/dL — ABNORMAL HIGH (ref 70–99)
Glucose-Capillary: 191 mg/dL — ABNORMAL HIGH (ref 70–99)
Glucose-Capillary: 223 mg/dL — ABNORMAL HIGH (ref 70–99)

## 2022-11-30 LAB — CBC
HCT: 32.3 % — ABNORMAL LOW (ref 36.0–46.0)
Hemoglobin: 10.3 g/dL — ABNORMAL LOW (ref 12.0–15.0)
MCH: 30.9 pg (ref 26.0–34.0)
MCHC: 31.9 g/dL (ref 30.0–36.0)
MCV: 97 fL (ref 80.0–100.0)
Platelets: 220 10*3/uL (ref 150–400)
RBC: 3.33 MIL/uL — ABNORMAL LOW (ref 3.87–5.11)
RDW: 13.2 % (ref 11.5–15.5)
WBC: 7.2 10*3/uL (ref 4.0–10.5)
nRBC: 0 % (ref 0.0–0.2)

## 2022-11-30 LAB — PTH, INTACT AND CALCIUM: PTH: 61 pg/mL (ref 15–65)

## 2022-11-30 LAB — PHOSPHORUS: Phosphorus: 2.4 mg/dL — ABNORMAL LOW (ref 3.0–4.3)

## 2022-11-30 LAB — AMMONIA: Ammonia: 20 umol/L (ref 9–35)

## 2022-11-30 LAB — MAGNESIUM
Magnesium: 1.9 mg/dL (ref 1.6–2.3)
Magnesium: 2 mg/dL (ref 1.7–2.4)

## 2022-11-30 MED ORDER — LORAZEPAM 0.5 MG PO TABS
0.5000 mg | ORAL_TABLET | Freq: Every evening | ORAL | Status: DC | PRN
  Administered 2022-12-02: 0.5 mg via ORAL
  Filled 2022-11-30: qty 1

## 2022-11-30 NOTE — Progress Notes (Signed)
Physical Therapy Treatment Patient Details Name: Caitlin Mccarthy MRN: [id number] DOB: [birth year] Today's Date: 11/30/2022   History of Present Illness 87 y.o. female presenting 5/28 after a fall at home in which she sustained a periprosthetic femur fx. Now s/p ORIF on 5/29. PMH includes:  anxiety, depression, GERD, HLD, HTN, DM II, former smoker, COPD on 2 L O2 at baseline.    PT Comments    Pt received in supine and agreeable to session with daughters present. Pt able to improve bed mobility and ability to advance BLE to EOB. Pt requires step by step sequencing for all mobility tasks, but is able to follow one step commands well. Pt requires assist to elevate RLE to stedy platform. Pt able to stand from elevated EOB to stedy and from stedy paddles and tolerate standing for ~1 min with cues for upright posture and min A to bring hips underneath her. Pt unable to fully extend R knee in standing and demonstrates very limited WB tolerance. Pt continues to benefit from PT services to progress toward functional mobility goals.    Recommendations for follow up therapy are one component of a multi-disciplinary discharge planning process, led by the attending physician.  Recommendations may be updated based on patient status, additional functional criteria and insurance authorization.     Assistance Recommended at Discharge Frequent or constant Supervision/Assistance  Patient can return home with the following Help with stairs or ramp for entrance;Two people to help with walking and/or transfers;Two people to help with bathing/dressing/bathroom;Assistance with cooking/housework;Assistance with feeding;Direct supervision/assist for medications management;Direct supervision/assist for financial management;Assist for transportation   Equipment Recommendations  None recommended by PT    Recommendations for Other Services       Precautions / Restrictions Precautions Precautions:  Fall Restrictions Weight Bearing Restrictions: Yes RLE Weight Bearing: Weight bearing as tolerated     Mobility  Bed Mobility Overal bed mobility: Needs Assistance Bed Mobility: Supine to Sit     Supine to sit: Mod assist     General bed mobility comments: Pt able to advance BLE to EOB with min A for RLE. Mod A for trunk elevation due to pain. Dense cues for step by step sequencing    Transfers Overall transfer level: Needs assistance Equipment used: Ambulation equipment used Transfers: Sit to/from Stand, Bed to chair/wheelchair/BSC Sit to Stand: +2 physical assistance, From elevated surface, Mod assist           General transfer comment: From elevated EOB to stedy with mod A +2 for power up and cues for use of BUEs.  Pt able to stand from stedy paddles with min A. Min A to control descent when sitting to recliner. Transfer via Lift Equipment: Stedy      Balance Overall balance assessment: Needs assistance Sitting-balance support: Feet supported, No upper extremity supported Sitting balance-Leahy Scale: Fair Sitting balance - Comments: sitting EOB   Standing balance support: During functional activity, Bilateral upper extremity supported, Reliant on assistive device for balance Standing balance-Leahy Scale: Poor Standing balance comment: Pt able to stand with stedy support and min A for ~1 min x2                            Cognition Arousal/Alertness: Awake/alert Behavior During Therapy: WFL for tasks assessed/performed Overall Cognitive Status: Within Functional Limits for tasks assessed  Exercises      General Comments General comments (skin integrity, edema, etc.): Pt's 2 daughters present and supportive throughout      Pertinent Vitals/Pain Pain Assessment Pain Assessment: Faces Faces Pain Scale: Hurts even more Pain Location: R leg Pain Descriptors / Indicators: Aching, Grimacing,  Guarding Pain Intervention(s): Monitored during session, Limited activity within patient's tolerance, Repositioned     PT Goals (current goals can now be found in the care plan section) Acute Rehab PT Goals Patient Stated Goal: get back to cooking PT Goal Formulation: With patient/family Time For Goal Achievement: 12/12/22 Potential to Achieve Goals: Good Progress towards PT goals: Progressing toward goals    Frequency    Min 3X/week      PT Plan Current plan remains appropriate       AM-PAC PT "6 Clicks" Mobility   Outcome Measure  Help needed turning from your back to your side while in a flat bed without using bedrails?: A Little Help needed moving from lying on your back to sitting on the side of a flat bed without using bedrails?: A Lot Help needed moving to and from a bed to a chair (including a wheelchair)?: Total Help needed standing up from a chair using your arms (e.g., wheelchair or bedside chair)?: Total Help needed to walk in hospital room?: Total Help needed climbing 3-5 steps with a railing? : Total 6 Click Score: 9    End of Session Equipment Utilized During Treatment: Oxygen;Gait belt Activity Tolerance: Patient tolerated treatment well;Patient limited by fatigue Patient left: in chair;with chair alarm set;with family/visitor present;with call bell/phone within reach Nurse Communication: Mobility status PT Visit Diagnosis: Unsteadiness on feet (R26.81);Other abnormalities of gait and mobility (R26.89);Muscle weakness (generalized) (M62.81)     Time: 1610-9604 PT Time Calculation (min) (ACUTE ONLY): 36 min  Charges:  $Therapeutic Activity: 23-37 mins                     Caitlin Mccarthy, PTA Acute Rehabilitation Services Secure Chat Preferred  [phone]    Caitlin Mccarthy 11/30/2022, 11:35 AM

## 2022-11-30 NOTE — Progress Notes (Signed)
One incision site has been scratched and has a small amount of bleeding.  Patient's daughter said the pt may have scratched the site in her sleep.  It has a tiny skin tear where it was scratched.  A 4x4 was applied.

## 2022-11-30 NOTE — Plan of Care (Signed)
  Problem: Health Behavior/Discharge Planning: Goal: Ability to manage health-related needs will improve Outcome: Progressing   Problem: Clinical Measurements: Goal: Will remain free from infection Outcome: Progressing   Problem: Activity: Goal: Risk for activity intolerance will decrease Outcome: Progressing   Problem: Pain Managment: Goal: General experience of comfort will improve Outcome: Progressing   Problem: Safety: Goal: Ability to remain free from injury will improve Outcome: Progressing   

## 2022-11-30 NOTE — Progress Notes (Signed)
[facility] Progress Note Patient: Caitlin Mccarthy ZOX:[id number] DOB: [birth year] DOA: 11/26/2022  DOS: the patient was seen and examined on 11/30/2022  Brief hospital course: PMH of HLD, HTN, type II DM, COPD, former smoker, GERD, anxiety, depression present to the hospital with a mechanical fall.  Found to have right femur fracture.  Initially presented t [facility].  Transferred to [facility] for evaluation by orthopedic trauma service.  SP ORIF right supracondylar distal femur fracture on 5/29. Assessment and Plan: Right supracondylar distal femur fracture. Management per orthopedics. Weightbearing per orthopedics. Pain management per orthopedics. SP ORIF on 5/29.  Postop acute blood loss anemia. Expected. Monitor H&H.  Currently relatively stable. No active bleeding or hematoma seen at the site.  History of COPD. Chronic respiratory failure with hypoxia on 2 LPM. Currently no evidence of exacerbation. Continue inhalers. Monitor.  Mechanical fall. PT OT evaluating.  HLD. Continue Crestor and fenofibrate.  Type 2 diabetes mellitus, uncontrolled with hyperglycemia, without long-term insulin use without any complication. Currently on sliding scale insulin.  Anxiety. On Ativan continue as needed.  Obesity Body mass index is 31.57 kg/m.  Placing the pt at higher risk of poor outcomes.  Concern for somnolence  Patient does not take gabapentin on a scheduled basis at home. Currently receiving that medication here in the hospital. Daughter is concerned about swollen legs. Overnight there was some workup done. Currently I do not think the patient is suffering from a UTI. No focal deficit at the time of my evaluation. No asterixis at the time of my evaluation. Will discontinue gabapentin and monitor.  BMP stable.  Chronic hypercalcemia.   Outpatient workup unremarkable. Will monitor. Continue vitamin D.   Subjective: No acute complaint.  No nausea or  vomiting.  Overnight there is concern about increased somnolence.  VBG unremarkable.  Physical Exam: In mild distress. Alert awake and oriented x 3.  No structures. No focal deficit. Clear to auscultation. S1-S2 present.  Data Reviewed: I have Reviewed nursing notes, Vitals, and Lab results. Reordered BMP and CBC.  Reviewed VBG.  Disposition: Status is: Inpatient Remains inpatient appropriate because: awaiting SNF placement.  enoxaparin (LOVENOX) injection 40 mg Start: 11/28/22 0800 SCDs Start: 11/27/22 1642 SCDs Start: 11/27/22 0056   Family Communication: Family at bedside Level of care: Med-Surg   Vitals:   11/30/22 0823 11/30/22 0836 11/30/22 1113 11/30/22 1707  BP:  (!) 124/58  (!) 130/50  Pulse:  76    Resp:  18  16  Temp:  99 F (37.2 C) 98.5 F (36.9 C) 98 F (36.7 C)  TempSrc:  Axillary Axillary Oral  SpO2: 99% 98%  100%  Weight:      Height:         Author: Lynden Oxford, MD 11/30/2022 6:15 PM  Please look on [email or web address] to find out who is on call.

## 2022-11-30 NOTE — Progress Notes (Addendum)
    Patient Name: HELLENA DUNNER           DOB: 1936/05/08  MRN: [id number]      Admission Date: 11/26/2022  Attending Provider: Rolly Salter, MD  Primary Diagnosis: Femur fracture, right [facility])   Level of care: Med-Surg    CROSS COVER NOTE   Date of Service   11/30/2022   CALEE HUGUNIN, 87 y.o. female, was admitted on 11/26/2022 for Femur fracture, right (HCC).    HPI/Events of Note   Family has concerns regarding patient being drowsy. No other acute changes reported.  Hemodynamically stable.  RN reports patient is A/O x3, following commands. No focal neurodeficits reported. Patient is easy to arouse, but drowsy.  Received gabapentin and oxycodone during daytime. Family reports similar drowsiness when patient had UTI. UA ordered.  Given history of COPD, will check pCO2. Currently remains on 2 L [place] with optimal saturation.    Interventions/ Plan   Orders placed for venous blood gas, CBG, urinalysis Hold sedating agents for now        Anthoney Harada, DNP, [employer] [facility] [place]

## 2022-12-01 DIAGNOSIS — S72301A Unspecified fracture of shaft of right femur, initial encounter for closed fracture: Secondary | ICD-10-CM | POA: Diagnosis not present

## 2022-12-01 LAB — GLUCOSE, CAPILLARY
Glucose-Capillary: 225 mg/dL — ABNORMAL HIGH (ref 70–99)
Glucose-Capillary: 111 mg/dL — ABNORMAL HIGH (ref 70–99)
Glucose-Capillary: 152 mg/dL — ABNORMAL HIGH (ref 70–99)
Glucose-Capillary: 139 mg/dL — ABNORMAL HIGH (ref 70–99)

## 2022-12-01 MED ORDER — TRAMADOL HCL 50 MG PO TABS
50.0000 mg | ORAL_TABLET | Freq: Four times a day (QID) | ORAL | Status: DC | PRN
  Administered 2022-12-01 – 2022-12-03 (×4): 50 mg via ORAL
  Filled 2022-12-01 (×4): qty 1

## 2022-12-01 MED ORDER — OXYCODONE HCL 5 MG PO TABS
5.0000 mg | ORAL_TABLET | ORAL | Status: DC | PRN
  Administered 2022-12-02: 5 mg via ORAL
  Filled 2022-12-01 (×2): qty 1

## 2022-12-01 NOTE — Progress Notes (Signed)
[facility] Progress Note Patient: Caitlin Mccarthy:[id number] DOB: [birth year] DOA: 11/26/2022  DOS: the patient was seen and examined on 12/01/2022  Brief hospital course: PMH of HLD, HTN, type II DM, COPD, former smoker, GERD, anxiety, depression present to the hospital with a mechanical fall.  Found to have right femur fracture.  Initially presented t [facility].  Transferred to [facility] for evaluation by orthopedic trauma service.  SP ORIF right supracondylar distal femur fracture on 5/29. Assessment and Plan: Right supracondylar distal femur fracture. Management per orthopedics. Weightbearing per orthopedics. Pain management per orthopedics.  Resuming home tramadol. SP ORIF on 5/29.  Postop acute blood loss anemia. Expected. After initial drop currently relatively stable. No active bleeding or hematoma seen at the site.  History of COPD. Chronic respiratory failure with hypoxia on 2 LPM. Currently no evidence of exacerbation. Continue inhalers. Monitor.  Mechanical fall. PT OT evaluating.  HLD. Continue Crestor and fenofibrate.  Type 2 diabetes mellitus, uncontrolled with hyperglycemia, without long-term insulin use without any complication. Currently on sliding scale insulin.  Anxiety. On Ativan continue as needed.  Obesity Body mass index is 31.57 kg/m.  Placing the pt at higher risk of poor outcomes.  Concern for somnolence  Patient does not take gabapentin on a scheduled basis at home. Currently receiving that medication here in the hospital. Patient was having significant somnolence Secondary to medication. No focal deficit.  No UTI.  No asterixis.  No tremors.  No seizures. Mentation improving after stopping gabapentin.  Chronic hypercalcemia.   Outpatient workup unremarkable. Will monitor. Continue vitamin D.   Subjective: No acute complaint.  No nausea no vomiting.  Improved mentation.  Physical Exam: General: in Mild distress, No  Rash Cardiovascular: S1 and S2 Present, No Murmur Respiratory: Good respiratory effort, Bilateral Air entry present. No Crackles, No wheezes Abdomen: Bowel Sound present, No tenderness Extremities: Right-sided trace edema Neuro: Alert and oriented x3, no new focal deficit normal finger-nose-finger.  No asterixis.  Data Reviewed: I have Reviewed nursing notes, Vitals, and Lab results. Reordered BMP and CBC.  Reviewed VBG.  Disposition: Status is: Inpatient Remains inpatient appropriate because: awaiting SNF placement.  Medically stable.  enoxaparin (LOVENOX) injection 40 mg Start: 11/28/22 0800 SCDs Start: 11/27/22 1642 SCDs Start: 11/27/22 0056   Family Communication: Family at bedside Level of care: Med-Surg   Vitals:   12/01/22 0525 12/01/22 0730 12/01/22 0959 12/01/22 1431  BP: (!) 149/46 (!) 128/45  (!) 144/45  Pulse: 64 61  60  Resp: 16 17  18   Temp: 98 F (36.7 C) 98.2 F (36.8 C)  98.3 F (36.8 C)  TempSrc: Oral Oral  Oral  SpO2: 99% 97% 97% 93%  Weight:      Height:         Author: Lynden Oxford, MD 12/01/2022 4:06 PM  Please look on [email or web address] to find out who is on call.

## 2022-12-02 ENCOUNTER — Other Ambulatory Visit (HOSPITAL_COMMUNITY): Payer: Medicare HMO

## 2022-12-02 DIAGNOSIS — S72301A Unspecified fracture of shaft of right femur, initial encounter for closed fracture: Secondary | ICD-10-CM | POA: Diagnosis not present

## 2022-12-02 LAB — GLUCOSE, CAPILLARY
Glucose-Capillary: 201 mg/dL — ABNORMAL HIGH (ref 70–99)
Glucose-Capillary: 117 mg/dL — ABNORMAL HIGH (ref 70–99)
Glucose-Capillary: 152 mg/dL — ABNORMAL HIGH (ref 70–99)
Glucose-Capillary: 153 mg/dL — ABNORMAL HIGH (ref 70–99)

## 2022-12-02 MED ORDER — FUROSEMIDE 10 MG/ML IJ SOLN: 20.0000 mg | Freq: Once | INTRAMUSCULAR | Status: DC

## 2022-12-02 MED ORDER — TRAMADOL HCL 50 MG PO TABS: 50.0000 mg | ORAL_TABLET | Freq: Four times a day (QID) | ORAL | 0 refills | Status: DC | PRN

## 2022-12-02 NOTE — Progress Notes (Signed)
Physical Therapy Treatment Patient Details Name: Caitlin Mccarthy MRN: [id number] DOB: 09/28/1935 Today's Date: 12/02/2022   History of Present Illness 87 y.o. female presenting 5/28 after a fall at home in which she sustained a periprosthetic femur fx. Now s/p ORIF on 5/29. PMH includes:  anxiety, depression, GERD, HLD, HTN, DM II, former smoker, COPD on 2 L O2 at baseline.    PT Comments    Pt received sitting in the recliner and agreeable to session with daughter present. Pt requesting to use the [facility] and is able to step pivot with assist for RW management. Pt progressing from mod A to min A with 3 standing trials. Pt able to tolerate a short gait distance with focus on improving LLE step length and RLE WB tolerance. Pt is motivated to improve mobility and was provided an HEP with instruction on proper techniques to perform independently. Pt continues to benefit from PT services to progress toward functional mobility goals.    Recommendations for follow up therapy are one component of a multi-disciplinary discharge planning process, led by the attending physician.  Recommendations may be updated based on patient status, additional functional criteria and insurance authorization.     Assistance Recommended at Discharge Frequent or constant Supervision/Assistance  Patient can return home with the following Help with stairs or ramp for entrance;Two people to help with walking and/or transfers;Two people to help with bathing/dressing/bathroom;Assistance with cooking/housework;Assistance with feeding;Direct supervision/assist for medications management;Direct supervision/assist for financial management;Assist for transportation   Equipment Recommendations  None recommended by PT    Recommendations for Other Services       Precautions / Restrictions Precautions Precautions: Fall Restrictions Weight Bearing Restrictions: Yes RLE Weight Bearing: Weight bearing as tolerated     Mobility   Bed Mobility               General bed mobility comments: Pt beginning and ending session in recliner    Transfers Overall transfer level: Needs assistance Equipment used: Rolling walker (2 wheels) Transfers: Sit to/from Stand, Bed to chair/wheelchair/BSC Sit to Stand: Mod assist, Min assist   Step pivot transfers: Min assist       General transfer comment: From recliner x2 and BSC x1 with mod A progressing to light min A for power up. Min A for RW management to pivot to [place]. Min A to control descent to recliner.    Ambulation/Gait Ambulation/Gait assistance: Min guard Gait Distance (Feet): 5 Feet Assistive device: Rolling walker (2 wheels) Gait Pattern/deviations: Decreased stance time - right, Step-to pattern, Trunk flexed, Decreased step length - right Gait velocity: decreased     General Gait Details: Pt able to take slow steps with decreased RLE WB and LLE step length. Cues for increased BUE support for RLE offloading. Attempted second gait trial, however pt with increased pain and fatigue.       Balance Overall balance assessment: Needs assistance Sitting-balance support: Feet supported, No upper extremity supported Sitting balance-Leahy Scale: Fair Sitting balance - Comments: sitting in recliner   Standing balance support: During functional activity, Bilateral upper extremity supported, Reliant on assistive device for balance Standing balance-Leahy Scale: Poor Standing balance comment: with RW support                            Cognition Arousal/Alertness: Awake/alert Behavior During Therapy: WFL for tasks assessed/performed Overall Cognitive Status: Within Functional Limits for tasks assessed  Exercises Other Exercises Other Exercises: Reviewed HEP exercises    General Comments General comments (skin integrity, edema, etc.): Pt's daughter present and supportive throughout       Pertinent Vitals/Pain Pain Assessment Pain Assessment: Faces Faces Pain Scale: Hurts even more Pain Location: R leg with WB Pain Descriptors / Indicators: Aching, Grimacing, Guarding Pain Intervention(s): Monitored during session, Repositioned     PT Goals (current goals can now be found in the care plan section) Acute Rehab PT Goals Patient Stated Goal: get back to cooking PT Goal Formulation: With patient/family Time For Goal Achievement: 12/12/22 Potential to Achieve Goals: Good Progress towards PT goals: Progressing toward goals    Frequency    Min 3X/week      PT Plan Current plan remains appropriate       AM-PAC PT "6 Clicks" Mobility   Outcome Measure  Help needed turning from your back to your side while in a flat bed without using bedrails?: A Little Help needed moving from lying on your back to sitting on the side of a flat bed without using bedrails?: A Lot Help needed moving to and from a bed to a chair (including a wheelchair)?: A Lot Help needed standing up from a chair using your arms (e.g., wheelchair or bedside chair)?: A Lot Help needed to walk in hospital room?: A Little Help needed climbing 3-5 steps with a railing? : Total 6 Click Score: 13    End of Session Equipment Utilized During Treatment: Gait belt Activity Tolerance: Patient tolerated treatment well Patient left: in chair;with chair alarm set;with family/visitor present;with call bell/phone within reach Nurse Communication: Mobility status PT Visit Diagnosis: Unsteadiness on feet (R26.81);Other abnormalities of gait and mobility (R26.89);Muscle weakness (generalized) (M62.81)     Time: 4098-1191 PT Time Calculation (min) (ACUTE ONLY): 26 min  Charges:  $Gait Training: 8-22 mins $Therapeutic Activity: 8-22 mins                    Johny Shock, PTA Acute Rehabilitation Services Secure Chat Preferred  [phone]    Johny Shock 12/02/2022, 11:03 AM

## 2022-12-02 NOTE — Plan of Care (Signed)
  Problem: Health Behavior/Discharge Planning: Goal: Ability to manage health-related needs will improve Outcome: Progressing   Problem: Clinical Measurements: Goal: Ability to maintain clinical measurements within normal limits will improve Outcome: Progressing Goal: Will remain free from infection Outcome: Progressing   Problem: Activity: Goal: Risk for activity intolerance will decrease Outcome: Progressing   Problem: Pain Managment: Goal: General experience of comfort will improve Outcome: Progressing   Problem: Safety: Goal: Ability to remain free from injury will improve Outcome: Progressing

## 2022-12-02 NOTE — TOC Progression Note (Addendum)
Transition of Care [facility]) - Progression Note    Patient Details  Name: Caitlin Mccarthy MRN: [id number] Date of Birth: [birth year]  Transition of Care [facility]) CM/SW Contact  Lorri Frederick, LCSW Phone Number: 12/02/2022, 12:09 PM  Clinical Narrative:   CSW spoke with pt daughter Misty regarding SNF vs HH.  Misty stating she thinks pt should remain in the hospital several more days and then would be ready to DC home.  CSW discussed with Dr Allena Katz, who reports pt is stable for DC.  Dr Allena Katz spoke with [facility] as well.  1200: Second call with Misty, she will move forward with SNF but stating she needs time to visit SNF options.  Pt has one bed offer, which was given to [facility]. She will be back in pt room shortly to look at the medicare.gov paper to see what other SNF facilities she is interested in.    1315: CSW spoke with pt, daughters [place] and April in room.  [facility] and [facility] have both offered beds.  Misty is going to visit those facilities.  Also possibly interested in [facility].  Misty called [facility] and was told they are full.  Misty agreed to call back before the end of the day after her visits.    PASSR received: [id number]  1600: attempted to call daughter, no answer.  SNF auth request submitted in [place] with facility choice pending.      Expected Discharge Plan: Skilled Nursing Facility Barriers to Discharge: Continued Medical Work up  Expected Discharge Plan and Services In-house Referral: Clinical Social Work   Post Acute Care Choice:  (TBD) Living arrangements for the past 2 months: Single Family Home                                       Social Determinants of Health (SDOH) Interventions SDOH Screenings   Food Insecurity: No Food Insecurity (11/27/2022)  Housing: Low Risk  (11/27/2022)  Transportation Needs: No Transportation Needs (11/27/2022)  Utilities: Not At Risk (11/27/2022)  Alcohol Screen: Low Risk  (02/05/2022)  Depression (PHQ2-9):  High Risk (11/18/2022)  Financial Resource Strain: Low Risk  (11/06/2022)  Physical Activity: Insufficiently Active (02/05/2022)  Social Connections: Moderately Integrated (02/05/2022)  Stress: No Stress Concern Present (02/05/2022)  Tobacco Use: Medium Risk (11/28/2022)    Readmission Risk Interventions    11/05/2022    9:48 AM  Readmission Risk Prevention Plan  Transportation Screening Complete  PCP or Specialist Appt within 5-7 Days Not Complete  Home Care Screening Complete  Medication Review (RN CM) Complete

## 2022-12-02 NOTE — Discharge Summary (Signed)
Physician Discharge Summary   Patient: CASSADY BOYKE MRN: [id number] DOB: 05-31-1936  Admit date:     11/26/2022  Discharge date: 12/02/22  Discharge Physician: Lynden Oxford  PCP: Bennie Pierini, FNP  Recommendations at discharge:  Follow up with Orthopedics and PCP as recommended   Follow-up Information     Haddix, Gillie Manners, MD. Schedule an appointment as soon as possible for a visit in 2 week(s).   Specialty: Orthopedic Surgery Why: for wound check and repeat x-rays Contact information: [address] [place] [address] [phone]         Bennie Pierini, FNP. Schedule an appointment as soon as possible for a visit in 1 week(s).   Specialty: Family Medicine Why: with CBC and BMP Contact information: [address] [place] [address] [phone]                Discharge Diagnoses: Principal Problem:   Femur fracture, right (HCC) Active Problems:   GAD (generalized anxiety disorder)   Type 2 diabetes mellitus (HCC)   COPD (chronic obstructive pulmonary disease) (HCC)   Hyperlipidemia due to type 2 diabetes mellitus (HCC)   Fall at home, initial encounter   Chronic respiratory failure with hypoxia [facility])  Hospital Course: PMH of HLD, HTN, type II DM, COPD, former smoker, GERD, anxiety, depression present to the hospital with a mechanical fall.  Found to have right femur fracture.  Initially presented t [facility].  Transferred to [facility] for evaluation by orthopedic trauma service.  SP ORIF right supracondylar distal femur fracture on 5/29.  Assessment and Plan  Right supracondylar distal femur fracture. Management per orthopedics. Weightbearing: WBAT RLE ROM: Unrestricted ROM Incisional and dressing care: Okay to leave incisions open to air.  Change Mepilex as needed Showering: Okay to being getting incisions wet 11/30/2022 if no drainage noted Pain management per orthopedics.  Resuming home tramadol. SP ORIF on 5/29.    Postop acute blood loss anemia. Expected. After initial drop currently relatively stable. No active bleeding or hematoma seen at the site.   History of COPD. Chronic respiratory failure with hypoxia on 2 LPM. Currently no evidence of exacerbation. Continue inhalers. Monitor.   Mechanical fall. PT OT recommended SNF.   HLD. Continue Crestor and fenofibrate.   Type 2 diabetes mellitus, uncontrolled with hyperglycemia, without long-term insulin use without any complication. Currently on sliding scale insulin.   Anxiety. On Ativan continue as needed.   Obesity Body mass index is 31.57 kg/m.  Placing the pt at higher risk of poor outcomes.   Gabapentin induced somnolence Patient does not take gabapentin on a scheduled basis at home. Currently receiving that medication here in the hospital. Patient was having significant somnolence Secondary to medication. No focal deficit.  No UTI.  No asterixis.  No tremors.  No seizures. Mentation improving after stopping gabapentin.   Chronic hypercalcemia.   Outpatient workup unremarkable. Continue vitamin D.   Pain control - [employer] Controlled Substance Reporting System database was reviewed. and patient was instructed, not to drive, operate heavy machinery, perform activities at heights, swimming or participation in water activities or provide baby-sitting services while on Pain, Sleep and Anxiety Medications; until their outpatient Physician has advised to do so again. Also recommended to not to take more than prescribed Pain, Sleep and Anxiety Medications.  Consultants:  Orthopedics  Procedures performed:  OPEN REDUCTION INTERNAL FIXATION RIGHT DISTAL FEMUR FRACTURE   DISCHARGE MEDICATION: Allergies as of 12/02/2022   No Known Allergies  Medication List     TAKE these medications    acetaminophen 500 MG tablet Commonly known as: TYLENOL Take 1 tablet (500 mg total) by mouth in the morning and at bedtime for 7  days.   amLODipine-benazepril 10-40 MG capsule Commonly known as: LOTREL Take 1 capsule by mouth daily.   apixaban 2.5 MG Tabs tablet Commonly known as: ELIQUIS Take 1 tablet (2.5 mg total) by mouth 2 (two) times daily.   aspirin EC 81 MG tablet Take 1 tablet (81 mg total) by mouth daily.   B-D SINGLE USE SWABS REGULAR Pads Test BS daily and as needed Dx E11.65   benzonatate 100 MG capsule Commonly known as: TESSALON Take 1 capsule (100 mg total) by mouth 2 (two) times daily as needed for up to 3 days for cough.   Cholecalciferol 50 MCG (2000 UT) Caps Take 1 capsule (2,000 Units total) by mouth daily with breakfast.   docusate sodium 100 MG capsule Commonly known as: Colace Take 1 capsule (100 mg total) by mouth 2 (two) times daily.   fenofibrate 145 MG tablet Commonly known as: TRICOR Take 1 tablet (145 mg total) by mouth daily.   LORazepam 0.5 MG tablet Commonly known as: ATIVAN Take 1 tablet (0.5 mg total) by mouth 2 (two) times daily as needed. for anxiety What changed: Another medication with the same name was removed. Continue taking this medication, and follow the directions you see here.   metFORMIN 500 MG 24 hr tablet Commonly known as: GLUCOPHAGE-XR Take 1 tablet (500 mg total) by mouth in the morning and at bedtime.   methocarbamol 500 MG tablet Commonly known as: ROBAXIN Take 1 tablet (500 mg total) by mouth every 6 (six) hours as needed for up to 10 days for muscle spasms.   nystatin powder Commonly known as: MYCOSTATIN/NYSTOP APPLY TOPICALLY FOUR TIMES DAILY What changed: See the new instructions.   ondansetron 4 MG disintegrating tablet Commonly known as: ZOFRAN-ODT TAKE 1 TABLET EVERY 8 HOURS AS NEEDED FOR NAUSEA AND VOMITING   OXYGEN Inhale 2 L into the lungs continuous as needed.   polyethylene glycol 17 g packet Commonly known as: MIRALAX / GLYCOLAX Take 17 g by mouth daily.   Premarin vaginal cream Generic drug: conjugated  estrogens Apply 1 application topically daily as needed (irritation).   rosuvastatin 40 MG tablet Commonly known as: CRESTOR TAKE ONE (1) TABLET EACH DAY   traMADol 50 MG tablet Commonly known as: ULTRAM Take 1-2 tablets (50-100 mg total) by mouth every 6 (six) hours as needed for moderate pain or severe pain. What changed:  how much to take when to take this reasons to take this additional instructions   True Metrix Air Glucose Meter w/Device Kit Test BS daily and as needed Dx E11.65   True Metrix Blood Glucose Test test strip Generic drug: glucose blood Test BS daily and as needed Dx E11.65   True Metrix Level 1 Low Soln Use with glucose monitor Dx E11.65   TRUEplus Lancets 33G Misc Test BS daily and as needed Dx E11.65   Vitamin D (Ergocalciferol) 1.25 MG (50000 UNIT) Caps capsule Commonly known as: DRISDOL Take 1 capsule (50,000 Units total) by mouth every 7 (seven) days. Start taking on: December 05, 2022       Disposition: SNF Diet recommendation: Regular diet  Discharge Exam: Vitals:   12/02/22 0627 12/02/22 0733 12/02/22 0826 12/02/22 0829  BP: (!) 152/59 (!) 140/58    Pulse: (!) 58 60 (!) 55  Resp: 16 16 16    Temp: 98.2 F (36.8 C) 97.7 F (36.5 C)    TempSrc:  Oral    SpO2: 95% 97% 96% 96%  Weight:      Height:       General: Appear in mild distress; no visible Abnormal Neck Mass Or lumps, Conjunctiva normal Cardiovascular: S1 and S2 Present, no Murmur, Respiratory: good respiratory effort, Bilateral Air entry present and CTA, no Crackles, no wheezes Abdomen: Bowel Sound present, Non tender  Extremities: no Pedal edema Neurology: alert and oriented to time, place, and person  Filed Weights   11/27/22 0148 11/27/22 1101  Weight: 85.9 kg 99.8 kg   Condition at discharge: stable  The results of significant diagnostics from this hospitalization (including imaging, microbiology, ancillary and laboratory) are listed below for reference.   Imaging  Studies: DG FEMUR PORT, MIN 2 VIEWS RIGHT  Result Date: 11/27/2022 CLINICAL DATA:  Femur fracture fixation, postop. EXAM: RIGHT FEMUR PORTABLE 2 VIEW COMPARISON:  Preoperative imaging. FINDINGS: Lateral plate and multi screw fixation of comminuted distal femur fracture. Improved fracture alignment from preoperative imaging. Prior knee and hip arthroplasty. Recent postsurgical change includes air and edema in the soft tissues. IMPRESSION: ORIF of comminuted distal femur fracture. Electronically Signed   By: Narda Rutherford M.D.   On: 11/27/2022 18:49   DG FEMUR, MIN 2 VIEWS RIGHT  Result Date: 11/27/2022 CLINICAL DATA:  Elective surgery. EXAM: RIGHT FEMUR 2 VIEWS COMPARISON:  Preoperative imaging. FINDINGS: Six fluoroscopic spot views of the right femur obtained in the operating room. Plate and multi screw fixation of comminuted distal femur fracture. There has been previous right knee arthroplasty. Fluoroscopy time 64.8 seconds. Dose 3.12 mGy. IMPRESSION: Intraoperative fluoroscopy during comminuted distal femur fracture ORIF. Electronically Signed   By: Narda Rutherford M.D.   On: 11/27/2022 18:48   DG C-Arm 1-60 Min-No Report  Result Date: 11/27/2022 Fluoroscopy was utilized by the requesting physician.  No radiographic interpretation.   DG Tibia/Fibula Right  Result Date: 11/26/2022 CLINICAL DATA:  Status post fall. EXAM: RIGHT TIBIA AND FIBULA - 2 VIEW COMPARISON:  None Available. FINDINGS: The right tibia and right fibula are intact. Acute fracture of the distal right femoral shaft is noted. An intact right knee replacement is seen. Medial and lateral soft tissue swelling is seen adjacent to the previously noted fracture site. IMPRESSION: 1. Acute fracture of the distal right femoral shaft. 2. Intact right knee replacement.  CT confirmation is recommended. Electronically Signed   By: Aram Candela M.D.   On: 11/26/2022 21:24   DG Knee Complete 4 Views Right  Result Date:  11/26/2022 CLINICAL DATA:  Status post fall. EXAM: RIGHT KNEE - COMPLETE 4+ VIEW COMPARISON:  None Available. FINDINGS: There is an acute fracture of the distal right femoral shaft, with approximately 1 shaft width posterior displacement of the distal fracture site. An intact right knee replacement is seen, without evidence of fracture involvement or dislocation. Soft tissue swelling is seen along the medial and lateral aspects of the previously noted fracture site. IMPRESSION: 1. Acute fracture of the distal right femoral shaft. 2. Intact right knee replacement. Electronically Signed   By: Aram Candela M.D.   On: 11/26/2022 21:17   DG FEMUR, MIN 2 VIEWS RIGHT  Result Date: 11/26/2022 CLINICAL DATA:  Status post fall. EXAM: RIGHT FEMUR 2 VIEWS COMPARISON:  None Available. FINDINGS: There is an acute fracture of the distal right femoral shaft, with approximately 1 shaft width posterior displacement of the  distal fracture site. Intact total right hip and right knee replacements are seen, without evidence of fracture involvement or dislocation. Soft tissue swelling is seen along the medial and lateral aspects of the previously noted fracture site. IMPRESSION: 1. Acute fracture of the distal right femoral shaft. 2. Intact total right hip and right knee replacements. Electronically Signed   By: Aram Candela M.D.   On: 11/26/2022 21:16   DG Chest 1 View  Result Date: 11/26/2022 CLINICAL DATA:  Status post fall. EXAM: CHEST  1 VIEW COMPARISON:  Nov 02, 2022 FINDINGS: The heart size and mediastinal contours are within normal limits. A radiopaque loop recorder device is seen. There is moderate severity calcification of the aortic arch and tortuosity of the descending thoracic aorta. Both lungs are clear. The visualized skeletal structures are unremarkable. IMPRESSION: No active disease. Electronically Signed   By: Aram Candela M.D.   On: 11/26/2022 21:13   CT CHEST WO CONTRAST  Result Date:  11/03/2022 CLINICAL DATA:  Cough EXAM: CT CHEST WITHOUT CONTRAST TECHNIQUE: Multidetector CT imaging of the chest was performed following the standard protocol without IV contrast. RADIATION DOSE REDUCTION: This exam was performed according to the departmental dose-optimization program which includes automated exposure control, adjustment of the mA and/or kV according to patient size and/or use of iterative reconstruction technique. COMPARISON:  Chest CT dated May 03, 2019 FINDINGS: Cardiovascular: Normal heart size. No pericardial effusion. Normal caliber thoracic aorta with severe atherosclerotic disease. Severe coronary artery calcifications. Mediastinum/Nodes: Esophagus and thyroid are unremarkable. No enlarged lymph nodes seen in the chest. Lungs/Pleura: Central airways are patent. Evaluation of the lungs is somewhat limited due to motion artifact. Mild opacity of the anterior right upper lobe, likely due to scarring or atelectasis. Previously described 4 mm left lower lobe pulmonary nodule is not seen on today's exam. No consolidation, pleural effusion or pneumothorax. Upper Abdomen: Bilateral adrenal gland thickening, unchanged when compared with the prior exam. No acute abnormality. Musculoskeletal: No chest wall mass or suspicious bone lesions identified. IMPRESSION: 1. No evidence of acute airspace opacity, evaluation somewhat limited due to motion artifact. 2. Severe coronary artery calcifications and aortic Atherosclerosis (ICD10-I70.0). Electronically Signed   By: Allegra Lai M.D.   On: 11/03/2022 17:33   ECHOCARDIOGRAM COMPLETE  Result Date: 11/03/2022    ECHOCARDIOGRAM REPORT   Patient Name:   KALINDA GOLDFIELD Date of Exam: 11/03/2022 Medical Rec #:  [id number]         Height:       70.0 in Accession #:    [id number]        Weight:       198.2 lb Date of Birth:  09/25/1935        BSA:          2.079 m Patient Age:    86 years          BP:           144/52 mmHg Patient Gender: F                  HR:           73 bpm. [facility] Procedure: 2D Echo, Color Doppler and Cardiac Doppler Indications:    I50.21 Acute systolic (congestive) heart failure  History:        Patient has prior history of Echocardiogram examinations, most                 recent 01/10/2017. COPD;  Risk Factors:Hypertension, Diabetes and                 Dyslipidemia.  Sonographer:    Irving Burton Senior RDCS Referring Phys: [id number] JACOB J STINSON  Sonographer Comments: Technically difficult due to COPD IMPRESSIONS  1. Left ventricular ejection fraction, by estimation, is >75%. The left ventricle has hyperdynamic function. The left ventricle has no regional wall motion abnormalities. Left ventricular diastolic parameters are indeterminate.  2. Right ventricular systolic function is normal. The right ventricular size is normal. Tricuspid regurgitation signal is inadequate for assessing PA pressure.  3. No obvious SAM (systolic anterior motion of mitral valve). The mitral valve is normal in structure. Trivial mitral valve regurgitation. No evidence of mitral stenosis.  4. The aortic valve is normal in structure. Aortic valve regurgitation is not visualized. No aortic stenosis is present.  5. The inferior vena cava is dilated in size with >50% respiratory variability, suggesting right atrial pressure of 8 mmHg. FINDINGS  Left Ventricle: Left ventricular ejection fraction, by estimation, is >75%. The left ventricle has hyperdynamic function. The left ventricle has no regional wall motion abnormalities. The left ventricular internal cavity size was normal in size. There is no left ventricular hypertrophy. Left ventricular diastolic parameters are indeterminate. Right Ventricle: The right ventricular size is normal. No increase in right ventricular wall thickness. Right ventricular systolic function is normal. Tricuspid regurgitation signal is inadequate for assessing PA pressure. Left Atrium: Left atrial size was normal in size. Right  Atrium: Right atrial size was normal in size. Pericardium: There is no evidence of pericardial effusion. Presence of epicardial fat layer. Mitral Valve: No obvious SAM (systolic anterior motion of mitral valve). The mitral valve is normal in structure. Trivial mitral valve regurgitation. No evidence of mitral valve stenosis. Tricuspid Valve: The tricuspid valve is normal in structure. Tricuspid valve regurgitation is not demonstrated. No evidence of tricuspid stenosis. Aortic Valve: The aortic valve is normal in structure. Aortic valve regurgitation is not visualized. No aortic stenosis is present. Pulmonic Valve: The pulmonic valve was normal in structure. Pulmonic valve regurgitation is not visualized. No evidence of pulmonic stenosis. Aorta: The aortic root is normal in size and structure. Venous: The inferior vena cava is dilated in size with greater than 50% respiratory variability, suggesting right atrial pressure of 8 mmHg. IAS/Shunts: No atrial level shunt detected by color flow Doppler.  LEFT VENTRICLE PLAX 2D LVIDd:         4.20 cm LVIDs:         2.70 cm LV PW:         1.00 cm LV IVS:        1.00 cm LVOT diam:     2.10 cm LV SV:         89 LV SV Index:   43 LVOT Area:     3.46 cm  RIGHT VENTRICLE RV S prime:     10.69 cm/s TAPSE (M-mode): 1.8 cm LEFT ATRIUM             Index        RIGHT ATRIUM           Index LA diam:        3.60 cm 1.73 cm/m   RA Area:     16.50 cm LA Vol (A2C):   45.2 ml 21.74 ml/m  RA Volume:   40.50 ml  19.48 ml/m LA Vol (A4C):   53.2 ml 25.59 ml/m LA Biplane Vol: 51.4 ml 24.72 ml/m  AORTIC VALVE LVOT Vmax:   118.33 cm/s LVOT Vmean:  86.667 cm/s LVOT VTI:    0.256 m  AORTA Ao Root diam: 3.20 cm Ao Asc diam:  3.50 cm  SHUNTS Systemic VTI:  0.26 m Systemic Diam: 2.10 cm Donato Schultz MD Electronically signed by Donato Schultz MD Signature Date/Time: 11/03/2022/11:28:46 AM    Final    DG Chest Port 1 View  Result Date: 11/02/2022 CLINICAL DATA:  Shortness of breath. EXAM: PORTABLE  CHEST 1 VIEW COMPARISON:  09/21/2019. FINDINGS: Implantable loop recorder projects over the left heart border. Mild pulmonary edema. No pleural effusion or pneumothorax. New mild left atrial enlargement. Unchanged atherosclerotic calcifications of the aortic arch. Visualized bones and upper abdomen are unremarkable. IMPRESSION: 1. Mild pulmonary edema with new mild left atrial enlargement. 2.  Aortic Atherosclerosis (ICD10-I70.0). Electronically Signed   By: Orvan Falconer M.D.   On: 11/02/2022 15:02    Microbiology: Results for orders placed or performed during the hospital encounter of 11/02/22  Respiratory (~20 pathogens) panel by PCR     Status: Abnormal   Collection Time: 11/02/22  9:40 PM   Specimen: Nasopharyngeal Swab; Respiratory  Result Value Ref Range Status   Adenovirus NOT DETECTED NOT DETECTED Final   Coronavirus 229E NOT DETECTED NOT DETECTED Final    Comment: (NOTE) The Coronavirus on the Respiratory Panel, DOES NOT test for the novel  Coronavirus (2019 nCoV)    Coronavirus HKU1 NOT DETECTED NOT DETECTED Final   Coronavirus NL63 NOT DETECTED NOT DETECTED Final   Coronavirus OC43 DETECTED (A) NOT DETECTED Final   Metapneumovirus NOT DETECTED NOT DETECTED Final   Rhinovirus / Enterovirus NOT DETECTED NOT DETECTED Final   Influenza A NOT DETECTED NOT DETECTED Final   Influenza B NOT DETECTED NOT DETECTED Final   Parainfluenza Virus 1 NOT DETECTED NOT DETECTED Final   Parainfluenza Virus 2 NOT DETECTED NOT DETECTED Final   Parainfluenza Virus 3 NOT DETECTED NOT DETECTED Final   Parainfluenza Virus 4 NOT DETECTED NOT DETECTED Final   Respiratory Syncytial Virus NOT DETECTED NOT DETECTED Final   Bordetella pertussis NOT DETECTED NOT DETECTED Final   Bordetella Parapertussis NOT DETECTED NOT DETECTED Final   Chlamydophila pneumoniae NOT DETECTED NOT DETECTED Final   Mycoplasma pneumoniae NOT DETECTED NOT DETECTED Final    Comment: Performed at [facility], [address] [place], [place] [address]  SARS Coronavirus 2 by RT PCR (hospital order, performed in [facility] hospital lab) *cepheid single result test* Anterior Nasal Swab     Status: None   Collection Time: 11/02/22  9:40 PM   Specimen: Anterior Nasal Swab  Result Value Ref Range Status   SARS Coronavirus 2 by RT PCR NEGATIVE NEGATIVE Final    Comment: (NOTE) SARS-CoV-2 target nucleic acids are NOT DETECTED.  The SARS-CoV-2 RNA is generally detectable in upper and lower respiratory specimens during the acute phase of infection. The lowest concentration of SARS-CoV-2 viral copies this assay can detect is 250 copies / mL. A negative result does not preclude SARS-CoV-2 infection and should not be used as the sole basis for treatment or other patient management decisions.  A negative result may occur with improper specimen collection / handling, submission of specimen other than nasopharyngeal swab, presence of viral mutation(s) within the areas targeted by this assay, and inadequate number of viral copies (<250 copies / mL). A negative result must be combined with clinical observations, patient history, and epidemiological information.  Fact Sheet for Patients:  RoadLapTop.co.za  Fact Sheet for Healthcare Providers: http://kim-miller.com/  This test is not yet approved or  cleared by the [place] FDA and has been authorized for detection and/or diagnosis of SARS-CoV-2 by FDA under an Emergency Use Authorization (EUA).  This EUA will remain in effect (meaning this test can be used) for the duration of the COVID-19 declaration under Section 564(b)(1) of the Act, 21 U.S.C. section 360bbb-3(b)(1), unless the authorization is terminated or revoked sooner.  Performed at [facility], [address], [place], [place] [address]    Labs: CBC: Recent Labs  Lab 11/26/22 2105 11/27/22 0411 11/27/22 1653 11/28/22 0139 11/29/22 0308  11/30/22 0146  WBC 7.5 7.0 7.4 6.5 6.8 7.2  NEUTROABS 5.8 5.0  --   --   --   --   HGB 13.0 12.7 12.1 10.9* 10.6* 10.3*  HCT 40.1 38.9 37.6 33.8* 33.0* 32.3*  MCV 96.4 96.3 95.7 96.3 95.7 97.0  PLT 273 283 266 262 234 220   Basic Metabolic Panel: Recent Labs  Lab 11/27/22 0411 11/27/22 1653 11/28/22 0139 11/29/22 0308 11/30/22 0146 11/30/22 1428  NA 132*  --  135 136 134* 135  K 3.9  --  4.6 4.4 4.5 4.3  CL 98  --  100 102 101 101  CO2 24  --  25 27 25 28   GLUCOSE 189*  --  190* 162* 176* 196*  BUN 12  --  12 21 26* 33*  CREATININE 0.58 0.80 0.89 0.93 0.95 0.96  CALCIUM 10.7*  --  10.3 10.4* 10.5* 10.6*  MG 1.8  --  1.8 2.0 2.0  --    Liver Function Tests: Recent Labs  Lab 11/27/22 0411  AST 24  ALT 22  ALKPHOS 42  BILITOT 0.7  PROT 7.4  ALBUMIN 3.6   CBG: Recent Labs  Lab 12/01/22 1125 12/01/22 1619 12/01/22 2109 12/02/22 0737 12/02/22 1137  GLUCAP 111* 152* 139* 201* 117*    Discharge time spent: greater than 30 minutes.  Signed: Lynden Oxford, MD [facility]

## 2022-12-02 NOTE — Anesthesia Preprocedure Evaluation (Signed)
Anesthesia Evaluation  Patient identified by MRN, date of birth, ID band Patient awake    Airway Mallampati: II       Dental  (+) Poor Dentition   Pulmonary Patient abstained from smoking., former smoker   Pulmonary exam normal        Cardiovascular hypertension, Pt. on medications Normal cardiovascular exam     Neuro/Psych  PSYCHIATRIC DISORDERS Anxiety Depression    CVA, No Residual Symptoms    GI/Hepatic   Endo/Other  diabetes, Type 2, Oral Hypoglycemic Agents    Renal/GU   negative genitourinary   Musculoskeletal   Abdominal Normal abdominal exam  (+)   Peds  Hematology   Anesthesia Other Findings   Reproductive/Obstetrics                             Anesthesia Physical Anesthesia Plan  ASA: 2  Anesthesia Plan: General   Post-op Pain Management: Dilaudid IV   Induction: Intravenous  PONV Risk Score and Plan: 3 and Ondansetron and Treatment may vary due to age or medical condition  Airway Management Planned: LMA  Additional Equipment: None  Intra-op Plan:   Post-operative Plan:   Informed Consent: I have reviewed the patients History and Physical, chart, labs and discussed the procedure including the risks, benefits and alternatives for the proposed anesthesia with the patient or authorized representative who has indicated his/her understanding and acceptance.     Dental advisory given  Plan Discussed with: CRNA  Anesthesia Plan Comments:        Anesthesia Quick Evaluation

## 2022-12-02 NOTE — Anesthesia Postprocedure Evaluation (Signed)
Anesthesia Post Note  Patient: SERRENA MEDLER  Procedure(s) Performed: OPEN REDUCTION INTERNAL FIXATION (ORIF) DISTAL FEMUR FRACTURE (Right: Leg Upper)     Patient location during evaluation: PACU Anesthesia Type: General Level of consciousness: sedated Pain management: pain level controlled Vital Signs Assessment: post-procedure vital signs reviewed and stable Respiratory status: spontaneous breathing Cardiovascular status: stable Postop Assessment: no apparent nausea or vomiting Anesthetic complications: no  No notable events documented.  Last Vitals:  Vitals:   12/01/22 2025 12/02/22 0627  BP:  (!) 152/59  Pulse:  (!) 58  Resp:  16  Temp:  36.8 C  SpO2: 99% 95%    Last Pain:  Vitals:   12/01/22 2019  TempSrc:   PainSc: 0-No pain                 Caren Macadam

## 2022-12-02 NOTE — Consult Note (Signed)
   [facility] Inpatient Consult   12/02/2022  Caitlin Mccarthy April 25, 1936 [id number]  [facility] [[facility]]  Accountable Care Organization [ACO] Patient: [employer]   Primary Care Provider: Bennie Pierini, FNP with [facility]    Patient screened for hospitalization with noted less than 30 days readmission risk and to assess for potential [facility]  [[facility]] Care Management service needs for post hospital transition for care coordination.  Review of patient's electronic medical record reveals [facility].  Reviewed for barriers to care   Plan:  Continue to follow progress and disposition to assess for post hospital community care coordination/management needs.  Referral request for community care coordination: if patient returns home from inpatient stay.  Of note, [facility] Care Management/Population Health does not replace or interfere with any arrangements made by the Inpatient Transition of Care team.  For questions contact:   Charlesetta Shanks, RN BSN CCM [facility]  [phone] business mobile phone Toll free office [phone]  *Concierge Line  [phone] Fax number: [phone] [email or web address]

## 2022-12-03 DIAGNOSIS — M6281 Muscle weakness (generalized): Secondary | ICD-10-CM | POA: Diagnosis not present

## 2022-12-03 DIAGNOSIS — M62522 Muscle wasting and atrophy, not elsewhere classified, left upper arm: Secondary | ICD-10-CM | POA: Diagnosis not present

## 2022-12-03 DIAGNOSIS — W19XXXA Unspecified fall, initial encounter: Secondary | ICD-10-CM | POA: Diagnosis not present

## 2022-12-03 DIAGNOSIS — F411 Generalized anxiety disorder: Secondary | ICD-10-CM | POA: Diagnosis not present

## 2022-12-03 DIAGNOSIS — J9611 Chronic respiratory failure with hypoxia: Secondary | ICD-10-CM | POA: Diagnosis not present

## 2022-12-03 DIAGNOSIS — S7291XD Unspecified fracture of right femur, subsequent encounter for closed fracture with routine healing: Secondary | ICD-10-CM | POA: Diagnosis not present

## 2022-12-03 DIAGNOSIS — R41841 Cognitive communication deficit: Secondary | ICD-10-CM | POA: Diagnosis not present

## 2022-12-03 DIAGNOSIS — S79929A Unspecified injury of unspecified thigh, initial encounter: Secondary | ICD-10-CM | POA: Diagnosis not present

## 2022-12-03 DIAGNOSIS — Z9181 History of falling: Secondary | ICD-10-CM | POA: Diagnosis not present

## 2022-12-03 DIAGNOSIS — J449 Chronic obstructive pulmonary disease, unspecified: Secondary | ICD-10-CM | POA: Diagnosis not present

## 2022-12-03 DIAGNOSIS — M62521 Muscle wasting and atrophy, not elsewhere classified, right upper arm: Secondary | ICD-10-CM | POA: Diagnosis not present

## 2022-12-03 DIAGNOSIS — Z743 Need for continuous supervision: Secondary | ICD-10-CM | POA: Diagnosis not present

## 2022-12-03 DIAGNOSIS — R2689 Other abnormalities of gait and mobility: Secondary | ICD-10-CM | POA: Diagnosis not present

## 2022-12-03 DIAGNOSIS — S72301A Unspecified fracture of shaft of right femur, initial encounter for closed fracture: Secondary | ICD-10-CM | POA: Diagnosis not present

## 2022-12-03 DIAGNOSIS — R531 Weakness: Secondary | ICD-10-CM | POA: Diagnosis not present

## 2022-12-03 LAB — GLUCOSE, CAPILLARY
Glucose-Capillary: 162 mg/dL — ABNORMAL HIGH (ref 70–99)
Glucose-Capillary: 166 mg/dL — ABNORMAL HIGH (ref 70–99)

## 2022-12-03 MED ORDER — ACETAMINOPHEN 500 MG PO TABS: 1000.0000 mg | ORAL_TABLET | Freq: Three times a day (TID) | ORAL | 0 refills | Status: AC

## 2022-12-03 MED ORDER — ACETAMINOPHEN 500 MG PO TABS: 500.0000 mg | ORAL_TABLET | Freq: Three times a day (TID) | ORAL | Status: DC

## 2022-12-03 MED ORDER — APIXABAN 2.5 MG PO TABS: 2.5000 mg | ORAL_TABLET | Freq: Two times a day (BID) | ORAL | 0 refills | Status: DC

## 2022-12-03 MED ORDER — ASPIRIN EC 81 MG PO TBEC: 81.0000 mg | DELAYED_RELEASE_TABLET | Freq: Every day | ORAL | 11 refills | Status: AC

## 2022-12-03 MED ORDER — APIXABAN 2.5 MG PO TABS: 2.5000 mg | ORAL_TABLET | Freq: Two times a day (BID) | ORAL | Status: DC

## 2022-12-03 MED ORDER — ACETAMINOPHEN 500 MG PO TABS
1000.0000 mg | ORAL_TABLET | Freq: Three times a day (TID) | ORAL | Status: DC
  Administered 2022-12-03: 1000 mg via ORAL

## 2022-12-03 MED ORDER — OXYCODONE HCL 5 MG PO TABS: 5.0000 mg | ORAL_TABLET | ORAL | 0 refills | Status: DC | PRN

## 2022-12-03 MED ORDER — BENAZEPRIL HCL 20 MG PO TABS
40.0000 mg | ORAL_TABLET | Freq: Every day | ORAL | Status: DC
  Administered 2022-12-03: 40 mg via ORAL
  Filled 2022-12-03: qty 2

## 2022-12-03 MED ORDER — METHOCARBAMOL 500 MG PO TABS: 500.0000 mg | ORAL_TABLET | Freq: Four times a day (QID) | ORAL | 0 refills | Status: AC | PRN

## 2022-12-03 NOTE — Care Management Important Message (Signed)
Important Message  Patient Details  Name: Caitlin Mccarthy MRN: [id number] Date of Birth: December 07, 1935   Medicare Important Message Given:  Yes     Sharhonda Stockwell 12/03/2022, 4:28 PM

## 2022-12-03 NOTE — TOC Progression Note (Addendum)
Transition of Care [facility]) - Progression Note    Patient Details  Name: Caitlin Mccarthy MRN: [id number] Date of Birth: [birth year]  Transition of Care [facility]) CM/SW Contact  Lorri Frederick, LCSW Phone Number: 12/03/2022, 10:27 AM  Clinical Narrative:   CSW received message from pt daughter requesting CSW pursue admission at [facility] CIR.  CSW spoke with [facility] Supervisor Jiles Crocker, who spoke with pt daughter Lanice Schwab regarding CIR vs SNF, reports Misty now agreeable to moving forward with SNF at [facility].   CSW confirmed with [facility]  that she can receive pt today.  Navi portal updated with SNF choice.   1320: Auth approved in [place]: [id number], [id number], 3 days: 6/4-6/6.    Per Haley/PT, pt does meet criteria for ambulance transport.    Expected Discharge Plan: Skilled Nursing Facility Barriers to Discharge: Continued Medical Work up  Expected Discharge Plan and Services In-house Referral: Clinical Social Work   Post Acute Care Choice:  (TBD) Living arrangements for the past 2 months: Single Family Home                                       Social Determinants of Health (SDOH) Interventions SDOH Screenings   Food Insecurity: No Food Insecurity (11/27/2022)  Housing: Low Risk  (11/27/2022)  Transportation Needs: No Transportation Needs (11/27/2022)  Utilities: Not At Risk (11/27/2022)  Alcohol Screen: Low Risk  (02/05/2022)  Depression (PHQ2-9): High Risk (11/18/2022)  Financial Resource Strain: Low Risk  (11/06/2022)  Physical Activity: Insufficiently Active (02/05/2022)  Social Connections: Moderately Integrated (02/05/2022)  Stress: No Stress Concern Present (02/05/2022)  Tobacco Use: Medium Risk (11/28/2022)    Readmission Risk Interventions    11/05/2022    9:48 AM  Readmission Risk Prevention Plan  Transportation Screening Complete  PCP or Specialist Appt within 5-7 Days Not Complete  Home Care Screening Complete  Medication Review (RN CM) Complete

## 2022-12-03 NOTE — Progress Notes (Signed)
Attempted to call report to [facility] at [phone] however nurse was unavailable at this time per secretary. 5 [address] unit number left for nurse to call us back.

## 2022-12-03 NOTE — Progress Notes (Signed)
All hospital equipment removed and pt belongings returned. No other needs identified. 

## 2022-12-03 NOTE — Discharge Summary (Addendum)
Physician Discharge Summary   Patient: Caitlin Mccarthy MRN: [id number] DOB: 03-01-1936  Admit date:     11/26/2022  Discharge date: 12/03/22  Discharge Physician: Lynden Oxford  PCP: Bennie Pierini, FNP  Recommendations at discharge:  Follow up with Orthopedics and PCP as recommended Temporary holding aspirin while the patient is on Eliquis for DVT prophylaxis.  Resume after 1 month. Temporarily giving Tylenol on a scheduled basis for 1 week to help with pain control.   Contact information for follow-up providers     Haddix, Gillie Manners, MD. Schedule an appointment as soon as possible for a visit in 2 week(s).   Specialty: Orthopedic Surgery Why: for wound check and repeat x-rays Contact information: [address] [place] [address] [phone]         Bennie Pierini, FNP. Schedule an appointment as soon as possible for a visit in 1 week(s).   Specialty: Family Medicine Why: with CBC and BMP Contact information: [address] [place] [address] [phone]              Contact information for after-discharge care     Destination     [facility] Preferred SNF .   Service: Skilled Nursing Contact information: [address] [place] [address] [phone]                    Discharge Diagnoses: Principal Problem:   Femur fracture, right (HCC) Active Problems:   GAD (generalized anxiety disorder)   Type 2 diabetes mellitus (HCC)   COPD (chronic obstructive pulmonary disease) (HCC)   Hyperlipidemia due to type 2 diabetes mellitus (HCC)   Fall at home, initial encounter   Chronic respiratory failure with hypoxia [place] [facility])  Hospital Course: PMH of HLD, HTN, type II DM, COPD, former smoker, GERD, anxiety, depression present to the hospital with a mechanical fall.  Found to have right femur fracture.  Initially presented t [facility].  Transferred to [facility] for evaluation by orthopedic  trauma service.  SP ORIF right supracondylar distal femur fracture on 5/29.  Assessment and Plan  Right supracondylar distal femur fracture. SP ORIF on 5/29. Management per orthopedics. Weightbearing: WBAT RLE ROM: Unrestricted ROM Incisional and dressing care: Okay to leave incisions open to air.  Change Mepilex as needed Showering: Okay to being getting incisions wet 11/30/2022 if no drainage noted Pain management per orthopedics.  Resuming home tramadol. PRN oxy IR and scheduled tylenol also ordered.    Postop acute blood loss anemia. Expected. After initial drop currently relatively stable. No active bleeding or hematoma seen at the site.   History of COPD. Chronic respiratory failure with hypoxia on 2 LPM. Currently no evidence of exacerbation. Continue inhalers. Monitor.   Mechanical fall. PT OT recommended SNF.   HLD. Continue Crestor and fenofibrate.   Type 2 diabetes mellitus, uncontrolled with hyperglycemia, without long-term insulin use without any complication. Currently on sliding scale insulin.   Anxiety. On Ativan continue as needed.   Obesity Body mass index is 31.57 kg/m.  Placing the pt at higher risk of poor outcomes.   Gabapentin induced somnolence Patient does not take gabapentin on a scheduled basis at home. Pt was getting that medication here in the hospital. Patient was having significant somnolence Secondary to medication. No focal deficit.  No UTI.  No asterixis.  No tremors.  No seizures. Mentation improving after stopping gabapentin.   Chronic hypercalcemia.   Outpatient workup unremarkable. Continue vitamin D.  Pain control - [employer] Controlled Substance Reporting System database was reviewed. and patient was instructed, not to drive, operate heavy machinery, perform activities at heights, swimming or participation in water activities or provide baby-sitting services while on Pain, Sleep and Anxiety Medications; until their  outpatient Physician has advised to do so again. Also recommended to not to take more than prescribed Pain, Sleep and Anxiety Medications.  Consultants:  Orthopedics  Procedures performed:  OPEN REDUCTION INTERNAL FIXATION RIGHT DISTAL FEMUR FRACTURE   DISCHARGE MEDICATION: Allergies as of 12/03/2022       Reactions   Gabapentin Other (See Comments)   Confusion and somnolence         Medication List     STOP taking these medications    Cholecalciferol 50 MCG (2000 UT) Caps       TAKE these medications    acetaminophen 500 MG tablet Commonly known as: TYLENOL Take 2 tablets (1,000 mg total) by mouth every 8 (eight) hours for 7 days.   amLODipine-benazepril 10-40 MG capsule Commonly known as: LOTREL Take 1 capsule by mouth daily.   apixaban 2.5 MG Tabs tablet Commonly known as: ELIQUIS Take 1 tablet (2.5 mg total) by mouth 2 (two) times daily for 26 days.   aspirin EC 81 MG tablet Take 1 tablet (81 mg total) by mouth daily. Start taking on: December 29, 2022 What changed: These instructions start on December 29, 2022. If you are unsure what to do until then, ask your doctor or other care provider.   B-D SINGLE USE SWABS REGULAR Pads Test BS daily and as needed Dx E11.65   docusate sodium 100 MG capsule Commonly known as: Colace Take 1 capsule (100 mg total) by mouth 2 (two) times daily.   fenofibrate 145 MG tablet Commonly known as: TRICOR Take 1 tablet (145 mg total) by mouth daily.   LORazepam 0.5 MG tablet Commonly known as: ATIVAN Take 1 tablet (0.5 mg total) by mouth 2 (two) times daily as needed. for anxiety What changed: Another medication with the same name was removed. Continue taking this medication, and follow the directions you see here.   metFORMIN 500 MG 24 hr tablet Commonly known as: GLUCOPHAGE-XR Take 1 tablet (500 mg total) by mouth in the morning and at bedtime.   methocarbamol 500 MG tablet Commonly known as: ROBAXIN Take 1 tablet (500 mg  total) by mouth every 6 (six) hours as needed for up to 10 days for muscle spasms.   nystatin powder Commonly known as: MYCOSTATIN/NYSTOP APPLY TOPICALLY FOUR TIMES DAILY What changed: See the new instructions.   ondansetron 4 MG disintegrating tablet Commonly known as: ZOFRAN-ODT TAKE 1 TABLET EVERY 8 HOURS AS NEEDED FOR NAUSEA AND VOMITING   oxyCODONE 5 MG immediate release tablet Commonly known as: Oxy IR/ROXICODONE Take 1 tablet (5 mg total) by mouth every 4 (four) hours as needed for severe pain.   OXYGEN Inhale 2 L into the lungs continuous as needed.   polyethylene glycol 17 g packet Commonly known as: MIRALAX / GLYCOLAX Take 17 g by mouth daily.   Premarin vaginal cream Generic drug: conjugated estrogens Apply 1 application topically daily as needed (irritation).   rosuvastatin 40 MG tablet Commonly known as: CRESTOR TAKE ONE (1) TABLET EACH DAY   traMADol 50 MG tablet Commonly known as: ULTRAM Take 1-2 tablets (50-100 mg total) by mouth every 6 (six) hours as needed for moderate pain or severe pain. What changed:  how much to take when to  take this reasons to take this additional instructions   True Metrix Air Glucose Meter w/Device Kit Test BS daily and as needed Dx E11.65   True Metrix Blood Glucose Test test strip Generic drug: glucose blood Test BS daily and as needed Dx E11.65   True Metrix Level 1 Low Soln Use with glucose monitor Dx E11.65   TRUEplus Lancets 33G Misc Test BS daily and as needed Dx E11.65   Vitamin D (Ergocalciferol) 1.25 MG (50000 UNIT) Caps capsule Commonly known as: DRISDOL Take 1 capsule (50,000 Units total) by mouth every 7 (seven) days. Start taking on: December 05, 2022       Disposition: SNF Diet recommendation: Regular diet  Discharge Exam: Vitals:   12/02/22 2042 12/03/22 0346 12/03/22 0758 12/03/22 0800  BP: (!) 155/78 (!) 179/45    Pulse: 86 (!) 109 95   Resp: 15 17 19    Temp: 98.6 F (37 C) 98.1 F (36.7 C)     TempSrc:      SpO2: 92% 91% 95% 95%  Weight:      Height:       General: Appear in mild distress; no visible Abnormal Neck Mass Or lumps, Conjunctiva normal Cardiovascular: S1 and S2 Present, no Murmur, Respiratory: good respiratory effort, Bilateral Air entry present and CTA, no Crackles, no wheezes Abdomen: Bowel Sound present, Non tender  Extremities: no Pedal edema Neurology: alert and oriented to time, place, and person  Filed Weights   11/27/22 0148 11/27/22 1101  Weight: 85.9 kg 99.8 kg   Condition at discharge: stable  The results of significant diagnostics from this hospitalization (including imaging, microbiology, ancillary and laboratory) are listed below for reference.   Imaging Studies: DG FEMUR PORT, MIN 2 VIEWS RIGHT  Result Date: 11/27/2022 CLINICAL DATA:  Femur fracture fixation, postop. EXAM: RIGHT FEMUR PORTABLE 2 VIEW COMPARISON:  Preoperative imaging. FINDINGS: Lateral plate and multi screw fixation of comminuted distal femur fracture. Improved fracture alignment from preoperative imaging. Prior knee and hip arthroplasty. Recent postsurgical change includes air and edema in the soft tissues. IMPRESSION: ORIF of comminuted distal femur fracture. Electronically Signed   By: Narda Rutherford M.D.   On: 11/27/2022 18:49   DG FEMUR, MIN 2 VIEWS RIGHT  Result Date: 11/27/2022 CLINICAL DATA:  Elective surgery. EXAM: RIGHT FEMUR 2 VIEWS COMPARISON:  Preoperative imaging. FINDINGS: Six fluoroscopic spot views of the right femur obtained in the operating room. Plate and multi screw fixation of comminuted distal femur fracture. There has been previous right knee arthroplasty. Fluoroscopy time 64.8 seconds. Dose 3.12 mGy. IMPRESSION: Intraoperative fluoroscopy during comminuted distal femur fracture ORIF. Electronically Signed   By: Narda Rutherford M.D.   On: 11/27/2022 18:48   DG C-Arm 1-60 Min-No Report  Result Date: 11/27/2022 Fluoroscopy was utilized by the requesting  physician.  No radiographic interpretation.   DG Tibia/Fibula Right  Result Date: 11/26/2022 CLINICAL DATA:  Status post fall. EXAM: RIGHT TIBIA AND FIBULA - 2 VIEW COMPARISON:  None Available. FINDINGS: The right tibia and right fibula are intact. Acute fracture of the distal right femoral shaft is noted. An intact right knee replacement is seen. Medial and lateral soft tissue swelling is seen adjacent to the previously noted fracture site. IMPRESSION: 1. Acute fracture of the distal right femoral shaft. 2. Intact right knee replacement.  CT confirmation is recommended. Electronically Signed   By: Aram Candela M.D.   On: 11/26/2022 21:24   DG Knee Complete 4 Views Right  Result Date: 11/26/2022  CLINICAL DATA:  Status post fall. EXAM: RIGHT KNEE - COMPLETE 4+ VIEW COMPARISON:  None Available. FINDINGS: There is an acute fracture of the distal right femoral shaft, with approximately 1 shaft width posterior displacement of the distal fracture site. An intact right knee replacement is seen, without evidence of fracture involvement or dislocation. Soft tissue swelling is seen along the medial and lateral aspects of the previously noted fracture site. IMPRESSION: 1. Acute fracture of the distal right femoral shaft. 2. Intact right knee replacement. Electronically Signed   By: Aram Candela M.D.   On: 11/26/2022 21:17   DG FEMUR, MIN 2 VIEWS RIGHT  Result Date: 11/26/2022 CLINICAL DATA:  Status post fall. EXAM: RIGHT FEMUR 2 VIEWS COMPARISON:  None Available. FINDINGS: There is an acute fracture of the distal right femoral shaft, with approximately 1 shaft width posterior displacement of the distal fracture site. Intact total right hip and right knee replacements are seen, without evidence of fracture involvement or dislocation. Soft tissue swelling is seen along the medial and lateral aspects of the previously noted fracture site. IMPRESSION: 1. Acute fracture of the distal right femoral shaft. 2.  Intact total right hip and right knee replacements. Electronically Signed   By: Aram Candela M.D.   On: 11/26/2022 21:16   DG Chest 1 View  Result Date: 11/26/2022 CLINICAL DATA:  Status post fall. EXAM: CHEST  1 VIEW COMPARISON:  Nov 02, 2022 FINDINGS: The heart size and mediastinal contours are within normal limits. A radiopaque loop recorder device is seen. There is moderate severity calcification of the aortic arch and tortuosity of the descending thoracic aorta. Both lungs are clear. The visualized skeletal structures are unremarkable. IMPRESSION: No active disease. Electronically Signed   By: Aram Candela M.D.   On: 11/26/2022 21:13    Microbiology: Results for orders placed or performed during the hospital encounter of 11/02/22  Respiratory (~20 pathogens) panel by PCR     Status: Abnormal   Collection Time: 11/02/22  9:40 PM   Specimen: Nasopharyngeal Swab; Respiratory  Result Value Ref Range Status   Adenovirus NOT DETECTED NOT DETECTED Final   Coronavirus 229E NOT DETECTED NOT DETECTED Final    Comment: (NOTE) The Coronavirus on the Respiratory Panel, DOES NOT test for the novel  Coronavirus (2019 nCoV)    Coronavirus HKU1 NOT DETECTED NOT DETECTED Final   Coronavirus NL63 NOT DETECTED NOT DETECTED Final   Coronavirus OC43 DETECTED (A) NOT DETECTED Final   Metapneumovirus NOT DETECTED NOT DETECTED Final   Rhinovirus / Enterovirus NOT DETECTED NOT DETECTED Final   Influenza A NOT DETECTED NOT DETECTED Final   Influenza B NOT DETECTED NOT DETECTED Final   Parainfluenza Virus 1 NOT DETECTED NOT DETECTED Final   Parainfluenza Virus 2 NOT DETECTED NOT DETECTED Final   Parainfluenza Virus 3 NOT DETECTED NOT DETECTED Final   Parainfluenza Virus 4 NOT DETECTED NOT DETECTED Final   Respiratory Syncytial Virus NOT DETECTED NOT DETECTED Final   Bordetella pertussis NOT DETECTED NOT DETECTED Final   Bordetella Parapertussis NOT DETECTED NOT DETECTED Final   Chlamydophila  pneumoniae NOT DETECTED NOT DETECTED Final   Mycoplasma pneumoniae NOT DETECTED NOT DETECTED Final    Comment: Performed at [facility], [address] [place], [place] [address]  SARS Coronavirus 2 by RT PCR (hospital order, performed in [facility] hospital lab) *cepheid single result test* Anterior Nasal Swab     Status: None   Collection Time: 11/02/22  9:40 PM  Specimen: Anterior Nasal Swab  Result Value Ref Range Status   SARS Coronavirus 2 by RT PCR NEGATIVE NEGATIVE Final    Comment: (NOTE) SARS-CoV-2 target nucleic acids are NOT DETECTED.  The SARS-CoV-2 RNA is generally detectable in upper and lower respiratory specimens during the acute phase of infection. The lowest concentration of SARS-CoV-2 viral copies this assay can detect is 250 copies / mL. A negative result does not preclude SARS-CoV-2 infection and should not be used as the sole basis for treatment or other patient management decisions.  A negative result may occur with improper specimen collection / handling, submission of specimen other than nasopharyngeal swab, presence of viral mutation(s) within the areas targeted by this assay, and inadequate number of viral copies (<250 copies / mL). A negative result must be combined with clinical observations, patient history, and epidemiological information.  Fact Sheet for Patients:   RoadLapTop.co.za  Fact Sheet for Healthcare Providers: http://kim-miller.com/  This test is not yet approved or  cleared by the [place] FDA and has been authorized for detection and/or diagnosis of SARS-CoV-2 by FDA under an Emergency Use Authorization (EUA).  This EUA will remain in effect (meaning this test can be used) for the duration of the COVID-19 declaration under Section 564(b)(1) of the Act, 21 U.S.C. section 360bbb-3(b)(1), unless the authorization is terminated or revoked sooner.  Performed at [facility],  [address] [place], [place] [address]    Labs: CBC: Recent Labs  Lab 11/26/22 2105 11/27/22 0411 11/27/22 1653 11/28/22 0139 11/29/22 0308 11/30/22 0146  WBC 7.5 7.0 7.4 6.5 6.8 7.2  NEUTROABS 5.8 5.0  --   --   --   --   HGB 13.0 12.7 12.1 10.9* 10.6* 10.3*  HCT 40.1 38.9 37.6 33.8* 33.0* 32.3*  MCV 96.4 96.3 95.7 96.3 95.7 97.0  PLT 273 283 266 262 234 220   Basic Metabolic Panel: Recent Labs  Lab 11/27/22 0411 11/27/22 1653 11/28/22 0139 11/29/22 0308 11/30/22 0146 11/30/22 1428  NA 132*  --  135 136 134* 135  K 3.9  --  4.6 4.4 4.5 4.3  CL 98  --  100 102 101 101  CO2 24  --  25 27 25 28   GLUCOSE 189*  --  190* 162* 176* 196*  BUN 12  --  12 21 26* 33*  CREATININE 0.58 0.80 0.89 0.93 0.95 0.96  CALCIUM 10.7*  --  10.3 10.4* 10.5* 10.6*  MG 1.8  --  1.8 2.0 2.0  --    Liver Function Tests: Recent Labs  Lab 11/27/22 0411  AST 24  ALT 22  ALKPHOS 42  BILITOT 0.7  PROT 7.4  ALBUMIN 3.6   CBG: Recent Labs  Lab 12/02/22 1137 12/02/22 1616 12/02/22 2321 12/03/22 0721 12/03/22 1111  GLUCAP 117* 152* 153* 162* 166*    Discharge time spent: greater than 30 minutes.  Signed: Lynden Oxford, MD [facility]

## 2022-12-03 NOTE — TOC Transition Note (Signed)
Transition of Care [facility]) - CM/SW Discharge Note   Patient Details  Name: Caitlin Mccarthy MRN: [id number] Date of Birth: October 27, 1935  [facility]) CM/SW Contact:  Lorri Frederick, LCSW Phone Number: 12/03/2022, 1:36 PM   Clinical Narrative:   Pt discharging to [facility].  RN call report to [phone].     Final next level of care: Skilled Nursing Facility Barriers to Discharge: Barriers Resolved   Patient Goals and CMS Choice CMS Medicare.gov Compare Post Acute Care list provided to:: Patient Represenative (must comment) (daughter [place] and April) Choice offered to / list presented to : Adult Children  Discharge Placement                Patient [facility] bed at:  [facility]) Patient to be transferred to facility by: [facility] Name of family member notified: daughter Lanice Schwab Patient and family notified of of transfer: 12/03/22  Discharge Plan and Services Additional resources added to the After Visit Summary for   In-house Referral: Clinical Social Work   Post Acute Care Choice:  (TBD)                               Social Determinants of Health (SDOH) Interventions SDOH Screenings   Food Insecurity: No Food Insecurity (11/27/2022)  Housing: Low Risk  (11/27/2022)  Transportation Needs: No Transportation Needs (11/27/2022)  Utilities: Not At Risk (11/27/2022)  Alcohol Screen: Low Risk  (02/05/2022)  Depression (PHQ2-9): High Risk (11/18/2022)  Financial Resource Strain: Low Risk  (11/06/2022)  Physical Activity: Insufficiently Active (02/05/2022)  Social Connections: Moderately Integrated (02/05/2022)  Stress: No Stress Concern Present (02/05/2022)  Tobacco Use: Medium Risk (11/28/2022)     Readmission Risk Interventions    11/05/2022    9:48 AM  Readmission Risk Prevention Plan  Transportation Screening Complete  PCP or Specialist Appt within 5-7 Days Not Complete  Home Care Screening Complete  Medication Review (RN CM) Complete

## 2022-12-04 ENCOUNTER — Telehealth: Payer: Medicare HMO | Admitting: Nurse Practitioner

## 2022-12-05 ENCOUNTER — Encounter: Payer: Self-pay | Admitting: Nurse Practitioner

## 2022-12-05 ENCOUNTER — Telehealth (INDEPENDENT_AMBULATORY_CARE_PROVIDER_SITE_OTHER): Payer: Medicare HMO | Admitting: Nurse Practitioner

## 2022-12-05 DIAGNOSIS — S72301A Unspecified fracture of shaft of right femur, initial encounter for closed fracture: Secondary | ICD-10-CM | POA: Diagnosis not present

## 2022-12-05 DIAGNOSIS — W19XXXD Unspecified fall, subsequent encounter: Secondary | ICD-10-CM

## 2022-12-05 DIAGNOSIS — S72301D Unspecified fracture of shaft of right femur, subsequent encounter for closed fracture with routine healing: Secondary | ICD-10-CM

## 2022-12-05 DIAGNOSIS — W19XXXA Unspecified fall, initial encounter: Secondary | ICD-10-CM

## 2022-12-05 NOTE — Patient Instructions (Signed)
Caitlin Mccarthy, thank you for joining Bennie Pierini, FNP for today's virtual visit.  While this provider is not your primary care provider (PCP), if your PCP is located in our provider database this encounter information will be shared with them immediately following your visit.   A [facility] MyChart account gives you access to today's visit and all your visits, tests, and labs performed at [facility] " click here if you don't have a [facility] MyChart account or go to [email or web address]  Consent: (Patient) Caitlin Mccarthy provided verbal consent for this virtual visit at the beginning of the encounter.  Current Medications:  Current Outpatient Medications:    acetaminophen (TYLENOL) 500 MG tablet, Take 2 tablets (1,000 mg total) by mouth every 8 (eight) hours for 7 days., Disp: 42 tablet, Rfl: 0   Alcohol Swabs (B-D SINGLE USE SWABS REGULAR) PADS, Test BS daily and as needed Dx E11.65, Disp: 100 each, Rfl: 3   amLODipine-benazepril (LOTREL) 10-40 MG capsule, Take 1 capsule by mouth daily., Disp: 90 capsule, Rfl: 1   apixaban (ELIQUIS) 2.5 MG TABS tablet, Take 1 tablet (2.5 mg total) by mouth 2 (two) times daily for 26 days., Disp: 52 tablet, Rfl: 0   [START ON 12/29/2022] aspirin EC 81 MG tablet, Take 1 tablet (81 mg total) by mouth daily., Disp: 30 tablet, Rfl: 11   Blood Glucose Calibration (TRUE METRIX LEVEL 1) Low SOLN, Use with glucose monitor Dx E11.65, Disp: 3 each, Rfl: 0   Blood Glucose Monitoring Suppl (TRUE METRIX AIR GLUCOSE METER) w/Device KIT, Test BS daily and as needed Dx E11.65, Disp: 1 kit, Rfl: 0   docusate sodium (COLACE) 100 MG capsule, Take 1 capsule (100 mg total) by mouth 2 (two) times daily., Disp: 10 capsule, Rfl: 0   fenofibrate (TRICOR) 145 MG tablet, Take 1 tablet (145 mg total) by mouth daily., Disp: 90 tablet, Rfl: 1   glucose blood (TRUE METRIX BLOOD GLUCOSE TEST) test strip, Test BS daily and as needed Dx E11.65, Disp: 100 each,  Rfl: 3   LORazepam (ATIVAN) 0.5 MG tablet, Take 1 tablet (0.5 mg total) by mouth 2 (two) times daily as needed. for anxiety, Disp: 6 tablet, Rfl: 0   metFORMIN (GLUCOPHAGE-XR) 500 MG 24 hr tablet, Take 1 tablet (500 mg total) by mouth in the morning and at bedtime., Disp: 180 tablet, Rfl: 1   methocarbamol (ROBAXIN) 500 MG tablet, Take 1 tablet (500 mg total) by mouth every 6 (six) hours as needed for up to 10 days for muscle spasms., Disp: 30 tablet, Rfl: 0   nystatin (MYCOSTATIN/NYSTOP) powder, APPLY TOPICALLY FOUR TIMES DAILY (Patient taking differently: Apply 1 Application topically 4 (four) times daily.), Disp: 60 g, Rfl: 0   ondansetron (ZOFRAN-ODT) 4 MG disintegrating tablet, TAKE 1 TABLET EVERY 8 HOURS AS NEEDED FOR NAUSEA AND VOMITING, Disp: 20 tablet, Rfl: 3   oxyCODONE (OXY IR/ROXICODONE) 5 MG immediate release tablet, Take 1 tablet (5 mg total) by mouth every 4 (four) hours as needed for severe pain., Disp: 10 tablet, Rfl: 0   OXYGEN, Inhale 2 L into the lungs continuous as needed., Disp: , Rfl:    polyethylene glycol (MIRALAX / GLYCOLAX) 17 g packet, Take 17 g by mouth daily., Disp: , Rfl:    PREMARIN vaginal cream, Apply 1 application topically daily as needed (irritation). , Disp: , Rfl:    rosuvastatin (CRESTOR) 40 MG tablet, TAKE ONE (1) TABLET EACH DAY, Disp: 90 tablet, Rfl: 1   traMADol (  ULTRAM) 50 MG tablet, Take 1-2 tablets (50-100 mg total) by mouth every 6 (six) hours as needed for moderate pain or severe pain., Disp: 40 tablet, Rfl: 0   TRUEplus Lancets 33G MISC, Test BS daily and as needed Dx E11.65, Disp: 100 each, Rfl: 0   Vitamin D, Ergocalciferol, (DRISDOL) 1.25 MG (50000 UNIT) CAPS capsule, Take 1 capsule (50,000 Units total) by mouth every 7 (seven) days., Disp: 5 capsule, Rfl: 0   Medications ordered in this encounter:  No orders of the defined types were placed in this encounter.    *If you need refills on other medications prior to your next appointment, please  contact your pharmacy*  Follow-Up: Call back or seek an in-person evaluation if the symptoms worsen or if the condition fails to improve as anticipated.  [facility] [phone]  Other Instructions Fall prevention   If you have been instructed to have an in-person evaluation today at a local Urgent Care facility, please use the link below. It will take you to a list of all of our available [facility], including address, phone number and hours of operation. Please do not delay care.  [facility]  If you or a family member do not have a primary care provider, use the link below to schedule a visit and establish care. When you choose a [facility] primary care physician or advanced practice provider, you gain a long-term partner in health. Find a Primary Care Provider  Learn more about [facility] in-office and virtual care options:  - Get Care Now

## 2022-12-05 NOTE — Progress Notes (Signed)
Virtual Visit Consent   Gwenevere Mccarthy, you are scheduled for a virtual visit with Caitlin Daphine Deutscher, FNP, a [facility] provider, today.     Just as with appointments in the office, your consent must be obtained to participate.  Your consent will be active for this visit and any virtual visit you may have with one of our providers in the next 365 days.     If you have a MyChart account, a copy of this consent can be sent to you electronically.  All virtual visits are billed to your insurance company just like a traditional visit in the office.    As this is a virtual visit, video technology does not allow for your provider to perform a traditional examination.  This may limit your provider's ability to fully assess your condition.  If your provider identifies any concerns that need to be evaluated in person or the need to arrange testing (such as labs, EKG, etc.), we will make arrangements to do so.     Although advances in technology are sophisticated, we cannot ensure that it will always work on either your end or our end.  If the connection with a video visit is poor, the visit may have to be switched to a telephone visit.  With either a video or telephone visit, we are not always able to ensure that we have a secure connection.     I need to obtain your verbal consent now.   Are you willing to proceed with your visit today? YES   ASPASIA FRIEDBERG has provided verbal consent on 12/05/2022 for a virtual visit (video or telephone).   Caitlin Daphine Deutscher, FNP   Date: 12/05/2022 2:04 PM   Virtual Visit via Video Note   I, Caitlin Mccarthy, connected with Caitlin Mccarthy ([id number], 1935-11-11) on 12/05/22 at  2:00 PM EDT by a video-enabled telemedicine application and verified that I am speaking with the correct person using two identifiers.  Location: Patient: Virtual Visit Location Patient: Other: [facility] nursing facility Provider: Virtual Visit Location Provider: Mobile   I  discussed the limitations of evaluation and management by telemedicine and the availability of in person appointments. The patient expressed understanding and agreed to proceed.    History of Present Illness: Caitlin Mccarthy is a 87 y.o. who identifies as a female who was assigned female at birth, and is being seen today for fall.  HPI: Patient fell on  11/26/22 in her bathroom. She suffered a femur fracture and wrist fracture. She had to have surgical repair. She was discharge to [facility] rehab 3 days ago. Patients family is very upset about her care and want to take her home MOnday. They need orders for bedside toilet and regular walker, as well as home PT.  Review of Systems  Constitutional: Negative.   Respiratory: Negative.    Cardiovascular: Negative.   Musculoskeletal:  Positive for falls.  Skin: Negative.   Neurological: Negative.     Problems:  Patient Active Problem List   Diagnosis Date Noted   Fall at home, initial encounter 11/27/2022   Chronic respiratory failure with hypoxia (HCC) 11/27/2022   Femur fracture, right (HCC) 11/26/2022   Acute renal failure superimposed on stage 3a chronic kidney disease (HCC) 11/03/2022   Acute on chronic respiratory failure with hypoxia (HCC) 11/02/2022   Acute clinical systolic heart failure (HCC) 11/02/2022   COPD with acute exacerbation (HCC) 11/02/2022   Overactive bladder 10/25/2019   Closed fracture of fourth metatarsal  bone 01/05/2019   Closed fracture of third metatarsal bone 01/05/2019   Pain management contract signed 06/11/2018   History of right hip replacement 12/16/2017   Vocal cord nodule 01/11/2017   Late effect of cerebrovascular accident (CVA) 01/10/2017   OA (osteoarthritis) 01/09/2017   History of kidney stones 01/09/2017   COPD (chronic obstructive pulmonary disease) (HCC) 11/09/2016   Hyperlipidemia due to type 2 diabetes mellitus (HCC) 11/09/2016   Glaucoma 05/10/2016   Vitamin D deficiency 05/10/2016   Current  smoker 05/10/2016   Osteoarthritis of both hands 05/10/2016   History of total right hip replacement 05/10/2016   Osteoporosis 05/09/2016   Type 2 diabetes mellitus (HCC) 02/05/2016   Overweight (BMI 25.0-29.9) 12/28/2015   GAD (generalized anxiety disorder) 12/28/2015   Hypokalemia 06/02/2015   Chronic pain 04/13/2015   Essential hypertension, benign 10/02/2012   GERD (gastroesophageal reflux disease) 10/02/2012   Depression 10/02/2012   OA (osteoarthritis) of hip 10/28/2011    Allergies:  Allergies  Allergen Reactions   Gabapentin Other (See Comments)    Confusion and somnolence    Medications:  Current Outpatient Medications:    acetaminophen (TYLENOL) 500 MG tablet, Take 2 tablets (1,000 mg total) by mouth every 8 (eight) hours for 7 days., Disp: 42 tablet, Rfl: 0   Alcohol Swabs (B-D SINGLE USE SWABS REGULAR) PADS, Test BS daily and as needed Dx E11.65, Disp: 100 each, Rfl: 3   amLODipine-benazepril (LOTREL) 10-40 MG capsule, Take 1 capsule by mouth daily., Disp: 90 capsule, Rfl: 1   apixaban (ELIQUIS) 2.5 MG TABS tablet, Take 1 tablet (2.5 mg total) by mouth 2 (two) times daily for 26 days., Disp: 52 tablet, Rfl: 0   [START ON 12/29/2022] aspirin EC 81 MG tablet, Take 1 tablet (81 mg total) by mouth daily., Disp: 30 tablet, Rfl: 11   Blood Glucose Calibration (TRUE METRIX LEVEL 1) Low SOLN, Use with glucose monitor Dx E11.65, Disp: 3 each, Rfl: 0   Blood Glucose Monitoring Suppl (TRUE METRIX AIR GLUCOSE METER) w/Device KIT, Test BS daily and as needed Dx E11.65, Disp: 1 kit, Rfl: 0   docusate sodium (COLACE) 100 MG capsule, Take 1 capsule (100 mg total) by mouth 2 (two) times daily., Disp: 10 capsule, Rfl: 0   fenofibrate (TRICOR) 145 MG tablet, Take 1 tablet (145 mg total) by mouth daily., Disp: 90 tablet, Rfl: 1   glucose blood (TRUE METRIX BLOOD GLUCOSE TEST) test strip, Test BS daily and as needed Dx E11.65, Disp: 100 each, Rfl: 3   LORazepam (ATIVAN) 0.5 MG tablet, Take 1  tablet (0.5 mg total) by mouth 2 (two) times daily as needed. for anxiety, Disp: 6 tablet, Rfl: 0   metFORMIN (GLUCOPHAGE-XR) 500 MG 24 hr tablet, Take 1 tablet (500 mg total) by mouth in the morning and at bedtime., Disp: 180 tablet, Rfl: 1   methocarbamol (ROBAXIN) 500 MG tablet, Take 1 tablet (500 mg total) by mouth every 6 (six) hours as needed for up to 10 days for muscle spasms., Disp: 30 tablet, Rfl: 0   nystatin (MYCOSTATIN/NYSTOP) powder, APPLY TOPICALLY FOUR TIMES DAILY (Patient taking differently: Apply 1 Application topically 4 (four) times daily.), Disp: 60 g, Rfl: 0   ondansetron (ZOFRAN-ODT) 4 MG disintegrating tablet, TAKE 1 TABLET EVERY 8 HOURS AS NEEDED FOR NAUSEA AND VOMITING, Disp: 20 tablet, Rfl: 3   oxyCODONE (OXY IR/ROXICODONE) 5 MG immediate release tablet, Take 1 tablet (5 mg total) by mouth every 4 (four) hours as needed for severe pain.,  Disp: 10 tablet, Rfl: 0   OXYGEN, Inhale 2 L into the lungs continuous as needed., Disp: , Rfl:    polyethylene glycol (MIRALAX / GLYCOLAX) 17 g packet, Take 17 g by mouth daily., Disp: , Rfl:    PREMARIN vaginal cream, Apply 1 application topically daily as needed (irritation). , Disp: , Rfl:    rosuvastatin (CRESTOR) 40 MG tablet, TAKE ONE (1) TABLET EACH DAY, Disp: 90 tablet, Rfl: 1   traMADol (ULTRAM) 50 MG tablet, Take 1-2 tablets (50-100 mg total) by mouth every 6 (six) hours as needed for moderate pain or severe pain., Disp: 40 tablet, Rfl: 0   TRUEplus Lancets 33G MISC, Test BS daily and as needed Dx E11.65, Disp: 100 each, Rfl: 0   Vitamin D, Ergocalciferol, (DRISDOL) 1.25 MG (50000 UNIT) CAPS capsule, Take 1 capsule (50,000 Units total) by mouth every 7 (seven) days., Disp: 5 capsule, Rfl: 0  Observations/Objective: Patient is well-developed, well-nourished in no acute distress.  Resting comfortably  at home.  Head is normocephalic, atraumatic.  No labored breathing.  Speech is clear and coherent with logical content.  Patient  is alert and oriented at baseline.  Sitting In wheelchair Right arm proped on pillow.  Assessment and Plan:  Gwenevere Mccarthy in today with chief complaint of No chief complaint on file.   1. Fall, subsequent encounter 2. Closed fracture of shaft of right femur with routine healing, unspecified fracture morphology, subsequent encounter Referral for home health and PT Orders Placed This Encounter  Procedures   For home use only DME Walker    Order Specific Question:   Patient needs a walker to treat with the following condition    Answer:   Fracture closed, femur, shaft (HCC) [604540]   DME Bedside commode    Order Specific Question:   Patient needs a bedside commode to treat with the following condition    Answer:   Closed femur fracture [facility]) [981191]   Ambulatory referral to Home Health    Referral Priority:   Routine    Referral Type:   Home Health Care    Referral Reason:   Specialty Services Required    Requested Specialty:   Home Health Services    Number of Visits Requested:   1    Follow Up Instructions: I discussed the assessment and treatment plan with the patient. The patient was provided an opportunity to ask questions and all were answered. The patient agreed with the plan and demonstrated an understanding of the instructions.  A copy of instructions were sent to the patient via MyChart.  The patient was advised to call back or seek an in-person evaluation if the symptoms worsen or if the condition fails to improve as anticipated.  Time:  I spent 17 minutes with the patient via telehealth technology discussing the above problems/concerns.    Caitlin Daphine Deutscher, FNP

## 2022-12-05 NOTE — Progress Notes (Signed)
DME orders faxed to [facility] at [phone]

## 2022-12-06 ENCOUNTER — Telehealth: Payer: Medicare HMO | Admitting: Nurse Practitioner

## 2022-12-10 ENCOUNTER — Telehealth: Payer: Medicare HMO | Admitting: Family Medicine

## 2022-12-10 ENCOUNTER — Ambulatory Visit: Payer: Medicare HMO | Admitting: "Endocrinology

## 2022-12-15 DIAGNOSIS — E1165 Type 2 diabetes mellitus with hyperglycemia: Secondary | ICD-10-CM | POA: Diagnosis not present

## 2022-12-15 DIAGNOSIS — I10 Essential (primary) hypertension: Secondary | ICD-10-CM | POA: Diagnosis not present

## 2022-12-15 DIAGNOSIS — E785 Hyperlipidemia, unspecified: Secondary | ICD-10-CM | POA: Diagnosis not present

## 2022-12-15 DIAGNOSIS — S72451D Displaced supracondylar fracture without intracondylar extension of lower end of right femur, subsequent encounter for closed fracture with routine healing: Secondary | ICD-10-CM | POA: Diagnosis not present

## 2022-12-15 DIAGNOSIS — D62 Acute posthemorrhagic anemia: Secondary | ICD-10-CM | POA: Diagnosis not present

## 2022-12-15 DIAGNOSIS — F411 Generalized anxiety disorder: Secondary | ICD-10-CM | POA: Diagnosis not present

## 2022-12-15 DIAGNOSIS — J449 Chronic obstructive pulmonary disease, unspecified: Secondary | ICD-10-CM | POA: Diagnosis not present

## 2022-12-15 DIAGNOSIS — E1169 Type 2 diabetes mellitus with other specified complication: Secondary | ICD-10-CM | POA: Diagnosis not present

## 2022-12-15 DIAGNOSIS — J9611 Chronic respiratory failure with hypoxia: Secondary | ICD-10-CM | POA: Diagnosis not present

## 2022-12-17 MED ORDER — BACLOFEN 10 MG PO TABS: 10.0000 mg | ORAL_TABLET | Freq: Three times a day (TID) | ORAL | 0 refills | Status: AC

## 2022-12-17 NOTE — Telephone Encounter (Signed)
Do you know what they gave her because I dont want her to fall again

## 2022-12-18 DIAGNOSIS — S72451D Displaced supracondylar fracture without intracondylar extension of lower end of right femur, subsequent encounter for closed fracture with routine healing: Secondary | ICD-10-CM | POA: Diagnosis not present

## 2022-12-18 DIAGNOSIS — J449 Chronic obstructive pulmonary disease, unspecified: Secondary | ICD-10-CM | POA: Diagnosis not present

## 2022-12-18 DIAGNOSIS — J9611 Chronic respiratory failure with hypoxia: Secondary | ICD-10-CM | POA: Diagnosis not present

## 2022-12-18 DIAGNOSIS — E785 Hyperlipidemia, unspecified: Secondary | ICD-10-CM | POA: Diagnosis not present

## 2022-12-18 DIAGNOSIS — D62 Acute posthemorrhagic anemia: Secondary | ICD-10-CM | POA: Diagnosis not present

## 2022-12-18 DIAGNOSIS — E1165 Type 2 diabetes mellitus with hyperglycemia: Secondary | ICD-10-CM | POA: Diagnosis not present

## 2022-12-18 DIAGNOSIS — F411 Generalized anxiety disorder: Secondary | ICD-10-CM | POA: Diagnosis not present

## 2022-12-18 DIAGNOSIS — E1169 Type 2 diabetes mellitus with other specified complication: Secondary | ICD-10-CM | POA: Diagnosis not present

## 2022-12-18 DIAGNOSIS — I10 Essential (primary) hypertension: Secondary | ICD-10-CM | POA: Diagnosis not present

## 2022-12-19 DIAGNOSIS — F411 Generalized anxiety disorder: Secondary | ICD-10-CM | POA: Diagnosis not present

## 2022-12-19 DIAGNOSIS — I10 Essential (primary) hypertension: Secondary | ICD-10-CM | POA: Diagnosis not present

## 2022-12-19 DIAGNOSIS — D62 Acute posthemorrhagic anemia: Secondary | ICD-10-CM | POA: Diagnosis not present

## 2022-12-19 DIAGNOSIS — J9611 Chronic respiratory failure with hypoxia: Secondary | ICD-10-CM | POA: Diagnosis not present

## 2022-12-19 DIAGNOSIS — S72451D Displaced supracondylar fracture without intracondylar extension of lower end of right femur, subsequent encounter for closed fracture with routine healing: Secondary | ICD-10-CM | POA: Diagnosis not present

## 2022-12-19 DIAGNOSIS — E1169 Type 2 diabetes mellitus with other specified complication: Secondary | ICD-10-CM | POA: Diagnosis not present

## 2022-12-19 DIAGNOSIS — E1165 Type 2 diabetes mellitus with hyperglycemia: Secondary | ICD-10-CM | POA: Diagnosis not present

## 2022-12-19 DIAGNOSIS — J449 Chronic obstructive pulmonary disease, unspecified: Secondary | ICD-10-CM | POA: Diagnosis not present

## 2022-12-19 DIAGNOSIS — E785 Hyperlipidemia, unspecified: Secondary | ICD-10-CM | POA: Diagnosis not present

## 2022-12-24 DIAGNOSIS — S72451D Displaced supracondylar fracture without intracondylar extension of lower end of right femur, subsequent encounter for closed fracture with routine healing: Secondary | ICD-10-CM | POA: Diagnosis not present

## 2022-12-25 DIAGNOSIS — J9611 Chronic respiratory failure with hypoxia: Secondary | ICD-10-CM | POA: Diagnosis not present

## 2022-12-25 DIAGNOSIS — E1165 Type 2 diabetes mellitus with hyperglycemia: Secondary | ICD-10-CM | POA: Diagnosis not present

## 2022-12-25 DIAGNOSIS — S72451D Displaced supracondylar fracture without intracondylar extension of lower end of right femur, subsequent encounter for closed fracture with routine healing: Secondary | ICD-10-CM | POA: Diagnosis not present

## 2022-12-25 DIAGNOSIS — F411 Generalized anxiety disorder: Secondary | ICD-10-CM | POA: Diagnosis not present

## 2022-12-25 DIAGNOSIS — J449 Chronic obstructive pulmonary disease, unspecified: Secondary | ICD-10-CM | POA: Diagnosis not present

## 2022-12-25 DIAGNOSIS — I10 Essential (primary) hypertension: Secondary | ICD-10-CM | POA: Diagnosis not present

## 2022-12-25 DIAGNOSIS — E1169 Type 2 diabetes mellitus with other specified complication: Secondary | ICD-10-CM | POA: Diagnosis not present

## 2022-12-25 DIAGNOSIS — D62 Acute posthemorrhagic anemia: Secondary | ICD-10-CM | POA: Diagnosis not present

## 2022-12-25 DIAGNOSIS — E785 Hyperlipidemia, unspecified: Secondary | ICD-10-CM | POA: Diagnosis not present

## 2022-12-26 ENCOUNTER — Other Ambulatory Visit: Payer: Self-pay | Admitting: Nurse Practitioner

## 2022-12-26 DIAGNOSIS — J9611 Chronic respiratory failure with hypoxia: Secondary | ICD-10-CM | POA: Diagnosis not present

## 2022-12-26 DIAGNOSIS — S72451D Displaced supracondylar fracture without intracondylar extension of lower end of right femur, subsequent encounter for closed fracture with routine healing: Secondary | ICD-10-CM | POA: Diagnosis not present

## 2022-12-26 DIAGNOSIS — E1165 Type 2 diabetes mellitus with hyperglycemia: Secondary | ICD-10-CM | POA: Diagnosis not present

## 2022-12-26 DIAGNOSIS — F411 Generalized anxiety disorder: Secondary | ICD-10-CM | POA: Diagnosis not present

## 2022-12-26 DIAGNOSIS — E785 Hyperlipidemia, unspecified: Secondary | ICD-10-CM | POA: Diagnosis not present

## 2022-12-26 DIAGNOSIS — J449 Chronic obstructive pulmonary disease, unspecified: Secondary | ICD-10-CM | POA: Diagnosis not present

## 2022-12-26 DIAGNOSIS — E1169 Type 2 diabetes mellitus with other specified complication: Secondary | ICD-10-CM

## 2022-12-26 DIAGNOSIS — I10 Essential (primary) hypertension: Secondary | ICD-10-CM | POA: Diagnosis not present

## 2022-12-26 DIAGNOSIS — D62 Acute posthemorrhagic anemia: Secondary | ICD-10-CM | POA: Diagnosis not present

## 2022-12-27 DIAGNOSIS — F411 Generalized anxiety disorder: Secondary | ICD-10-CM | POA: Diagnosis not present

## 2022-12-27 DIAGNOSIS — E1165 Type 2 diabetes mellitus with hyperglycemia: Secondary | ICD-10-CM | POA: Diagnosis not present

## 2022-12-27 DIAGNOSIS — J9611 Chronic respiratory failure with hypoxia: Secondary | ICD-10-CM | POA: Diagnosis not present

## 2022-12-27 DIAGNOSIS — E1169 Type 2 diabetes mellitus with other specified complication: Secondary | ICD-10-CM | POA: Diagnosis not present

## 2022-12-27 DIAGNOSIS — S72451D Displaced supracondylar fracture without intracondylar extension of lower end of right femur, subsequent encounter for closed fracture with routine healing: Secondary | ICD-10-CM | POA: Diagnosis not present

## 2022-12-27 DIAGNOSIS — D62 Acute posthemorrhagic anemia: Secondary | ICD-10-CM | POA: Diagnosis not present

## 2022-12-27 DIAGNOSIS — E785 Hyperlipidemia, unspecified: Secondary | ICD-10-CM | POA: Diagnosis not present

## 2022-12-27 DIAGNOSIS — J449 Chronic obstructive pulmonary disease, unspecified: Secondary | ICD-10-CM | POA: Diagnosis not present

## 2022-12-27 DIAGNOSIS — I10 Essential (primary) hypertension: Secondary | ICD-10-CM | POA: Diagnosis not present

## 2022-12-31 ENCOUNTER — Other Ambulatory Visit: Payer: Self-pay | Admitting: Nurse Practitioner

## 2022-12-31 DIAGNOSIS — S72451D Displaced supracondylar fracture without intracondylar extension of lower end of right femur, subsequent encounter for closed fracture with routine healing: Secondary | ICD-10-CM | POA: Diagnosis not present

## 2022-12-31 DIAGNOSIS — E1165 Type 2 diabetes mellitus with hyperglycemia: Secondary | ICD-10-CM

## 2022-12-31 DIAGNOSIS — D62 Acute posthemorrhagic anemia: Secondary | ICD-10-CM | POA: Diagnosis not present

## 2022-12-31 DIAGNOSIS — E1169 Type 2 diabetes mellitus with other specified complication: Secondary | ICD-10-CM | POA: Diagnosis not present

## 2022-12-31 DIAGNOSIS — E785 Hyperlipidemia, unspecified: Secondary | ICD-10-CM | POA: Diagnosis not present

## 2022-12-31 DIAGNOSIS — I10 Essential (primary) hypertension: Secondary | ICD-10-CM | POA: Diagnosis not present

## 2022-12-31 DIAGNOSIS — F411 Generalized anxiety disorder: Secondary | ICD-10-CM | POA: Diagnosis not present

## 2022-12-31 DIAGNOSIS — J449 Chronic obstructive pulmonary disease, unspecified: Secondary | ICD-10-CM | POA: Diagnosis not present

## 2022-12-31 DIAGNOSIS — J9611 Chronic respiratory failure with hypoxia: Secondary | ICD-10-CM | POA: Diagnosis not present

## 2023-01-03 ENCOUNTER — Encounter: Payer: Self-pay | Admitting: Nurse Practitioner

## 2023-01-03 ENCOUNTER — Telehealth (INDEPENDENT_AMBULATORY_CARE_PROVIDER_SITE_OTHER): Payer: Medicare HMO | Admitting: Nurse Practitioner

## 2023-01-03 DIAGNOSIS — S72451D Displaced supracondylar fracture without intracondylar extension of lower end of right femur, subsequent encounter for closed fracture with routine healing: Secondary | ICD-10-CM | POA: Diagnosis not present

## 2023-01-03 DIAGNOSIS — Z09 Encounter for follow-up examination after completed treatment for conditions other than malignant neoplasm: Secondary | ICD-10-CM

## 2023-01-03 DIAGNOSIS — I10 Essential (primary) hypertension: Secondary | ICD-10-CM | POA: Diagnosis not present

## 2023-01-03 DIAGNOSIS — J9611 Chronic respiratory failure with hypoxia: Secondary | ICD-10-CM | POA: Diagnosis not present

## 2023-01-03 DIAGNOSIS — E785 Hyperlipidemia, unspecified: Secondary | ICD-10-CM | POA: Diagnosis not present

## 2023-01-03 DIAGNOSIS — J449 Chronic obstructive pulmonary disease, unspecified: Secondary | ICD-10-CM | POA: Diagnosis not present

## 2023-01-03 DIAGNOSIS — E1169 Type 2 diabetes mellitus with other specified complication: Secondary | ICD-10-CM | POA: Diagnosis not present

## 2023-01-03 DIAGNOSIS — D62 Acute posthemorrhagic anemia: Secondary | ICD-10-CM | POA: Diagnosis not present

## 2023-01-03 DIAGNOSIS — E1165 Type 2 diabetes mellitus with hyperglycemia: Secondary | ICD-10-CM | POA: Diagnosis not present

## 2023-01-03 DIAGNOSIS — S72301D Unspecified fracture of shaft of right femur, subsequent encounter for closed fracture with routine healing: Secondary | ICD-10-CM | POA: Diagnosis not present

## 2023-01-03 DIAGNOSIS — F411 Generalized anxiety disorder: Secondary | ICD-10-CM | POA: Diagnosis not present

## 2023-01-03 NOTE — Progress Notes (Addendum)
Subjective:    Patient ID: Caitlin Mccarthy, female    DOB: 1936/04/18, 87 y.o.   MRN: [id number]  Telephone visit-patient at home and her daughters are with her. She was reached by phone  Chief Complaint: rehab discharge  HPI  Patient fell 11/26/22 and fractured her femur. She had to have open reduction. After surgery she was in rehab. Did not like it there at all. Is now home and doing PT there. There has been nom change in her medications since being discharge d from hospital and rehab. She is getting PT at home 3 days a week. Her family is making her do exercises when they are not there. No changes to plan of care. Patient has not been using oxygen since being home. O2 sats have been remaining above 95%.  Family says they need a wheel chair to tranport herto doctors appointmnets. Patient Active Problem List   Diagnosis Date Noted   Fall at home, initial encounter 11/27/2022   Chronic respiratory failure with hypoxia (HCC) 11/27/2022   Femur fracture, right (HCC) 11/26/2022   Acute renal failure superimposed on stage 3a chronic kidney disease (HCC) 11/03/2022   Acute on chronic respiratory failure with hypoxia (HCC) 11/02/2022   Acute clinical systolic heart failure (HCC) 11/02/2022   COPD with acute exacerbation (HCC) 11/02/2022   Overactive bladder 10/25/2019   Closed fracture of fourth metatarsal bone 01/05/2019   Closed fracture of third metatarsal bone 01/05/2019   Pain management contract signed 06/11/2018   History of right hip replacement 12/16/2017   Vocal cord nodule 01/11/2017   Late effect of cerebrovascular accident (CVA) 01/10/2017   OA (osteoarthritis) 01/09/2017   History of kidney stones 01/09/2017   COPD (chronic obstructive pulmonary disease) (HCC) 11/09/2016   Hyperlipidemia due to type 2 diabetes mellitus (HCC) 11/09/2016   Glaucoma 05/10/2016   Vitamin D deficiency 05/10/2016   Current smoker 05/10/2016   Osteoarthritis of both hands 05/10/2016    History of total right hip replacement 05/10/2016   Osteoporosis 05/09/2016   Type 2 diabetes mellitus (HCC) 02/05/2016   Overweight (BMI 25.0-29.9) 12/28/2015   GAD (generalized anxiety disorder) 12/28/2015   Hypokalemia 06/02/2015   Chronic pain 04/13/2015   Essential hypertension, benign 10/02/2012   GERD (gastroesophageal reflux disease) 10/02/2012   Depression 10/02/2012   OA (osteoarthritis) of hip 10/28/2011       Review of Systems  Constitutional:  Negative for diaphoresis.  Eyes:  Negative for pain.  Respiratory:  Negative for shortness of breath.   Cardiovascular:  Negative for chest pain, palpitations and leg swelling.  Gastrointestinal:  Negative for abdominal pain.  Endocrine: Negative for polydipsia.  Skin:  Negative for rash.  Neurological:  Negative for dizziness, weakness and headaches.  Hematological:  Does not bruise/bleed easily.  All other systems reviewed and are negative.      Objective:   No physical assessment done         Assessment & Plan:   Caitlin Mccarthy in today with chief complaint of No chief complaint on file.   1. Hospital discharge follow-up Meds reviewed Will dc oxygen Continue PT Prescription for wheel chair RTO prn  Time spent with patient 15 min  The above assessment and management plan was discussed with the patient. The patient verbalized understanding of and has agreed to the management plan. Patient is aware to call the clinic if symptoms persist or worsen. Patient is aware when to return to the clinic for a follow-up visit. Patient  educated on when it is appropriate to go to the emergency department.   Mary-Margaret Daphine Deutscher, FNP

## 2023-01-06 DIAGNOSIS — I10 Essential (primary) hypertension: Secondary | ICD-10-CM | POA: Diagnosis not present

## 2023-01-06 DIAGNOSIS — E1169 Type 2 diabetes mellitus with other specified complication: Secondary | ICD-10-CM | POA: Diagnosis not present

## 2023-01-06 DIAGNOSIS — F411 Generalized anxiety disorder: Secondary | ICD-10-CM | POA: Diagnosis not present

## 2023-01-06 DIAGNOSIS — J449 Chronic obstructive pulmonary disease, unspecified: Secondary | ICD-10-CM | POA: Diagnosis not present

## 2023-01-06 DIAGNOSIS — E1165 Type 2 diabetes mellitus with hyperglycemia: Secondary | ICD-10-CM | POA: Diagnosis not present

## 2023-01-06 DIAGNOSIS — S72451D Displaced supracondylar fracture without intracondylar extension of lower end of right femur, subsequent encounter for closed fracture with routine healing: Secondary | ICD-10-CM | POA: Diagnosis not present

## 2023-01-06 DIAGNOSIS — D62 Acute posthemorrhagic anemia: Secondary | ICD-10-CM | POA: Diagnosis not present

## 2023-01-06 DIAGNOSIS — E785 Hyperlipidemia, unspecified: Secondary | ICD-10-CM | POA: Diagnosis not present

## 2023-01-06 DIAGNOSIS — J9611 Chronic respiratory failure with hypoxia: Secondary | ICD-10-CM | POA: Diagnosis not present

## 2023-01-09 DIAGNOSIS — F411 Generalized anxiety disorder: Secondary | ICD-10-CM | POA: Diagnosis not present

## 2023-01-09 DIAGNOSIS — D62 Acute posthemorrhagic anemia: Secondary | ICD-10-CM | POA: Diagnosis not present

## 2023-01-09 DIAGNOSIS — J9611 Chronic respiratory failure with hypoxia: Secondary | ICD-10-CM | POA: Diagnosis not present

## 2023-01-09 DIAGNOSIS — E1165 Type 2 diabetes mellitus with hyperglycemia: Secondary | ICD-10-CM | POA: Diagnosis not present

## 2023-01-09 DIAGNOSIS — S72451D Displaced supracondylar fracture without intracondylar extension of lower end of right femur, subsequent encounter for closed fracture with routine healing: Secondary | ICD-10-CM | POA: Diagnosis not present

## 2023-01-09 DIAGNOSIS — J449 Chronic obstructive pulmonary disease, unspecified: Secondary | ICD-10-CM | POA: Diagnosis not present

## 2023-01-09 DIAGNOSIS — E785 Hyperlipidemia, unspecified: Secondary | ICD-10-CM | POA: Diagnosis not present

## 2023-01-09 DIAGNOSIS — E1169 Type 2 diabetes mellitus with other specified complication: Secondary | ICD-10-CM | POA: Diagnosis not present

## 2023-01-09 DIAGNOSIS — I10 Essential (primary) hypertension: Secondary | ICD-10-CM | POA: Diagnosis not present

## 2023-01-14 DIAGNOSIS — E785 Hyperlipidemia, unspecified: Secondary | ICD-10-CM | POA: Diagnosis not present

## 2023-01-14 DIAGNOSIS — J9611 Chronic respiratory failure with hypoxia: Secondary | ICD-10-CM | POA: Diagnosis not present

## 2023-01-14 DIAGNOSIS — J449 Chronic obstructive pulmonary disease, unspecified: Secondary | ICD-10-CM | POA: Diagnosis not present

## 2023-01-14 DIAGNOSIS — S72451D Displaced supracondylar fracture without intracondylar extension of lower end of right femur, subsequent encounter for closed fracture with routine healing: Secondary | ICD-10-CM | POA: Diagnosis not present

## 2023-01-14 DIAGNOSIS — E1165 Type 2 diabetes mellitus with hyperglycemia: Secondary | ICD-10-CM | POA: Diagnosis not present

## 2023-01-14 DIAGNOSIS — F411 Generalized anxiety disorder: Secondary | ICD-10-CM | POA: Diagnosis not present

## 2023-01-14 DIAGNOSIS — E1169 Type 2 diabetes mellitus with other specified complication: Secondary | ICD-10-CM | POA: Diagnosis not present

## 2023-01-14 DIAGNOSIS — I10 Essential (primary) hypertension: Secondary | ICD-10-CM | POA: Diagnosis not present

## 2023-01-14 DIAGNOSIS — D62 Acute posthemorrhagic anemia: Secondary | ICD-10-CM | POA: Diagnosis not present

## 2023-01-15 DIAGNOSIS — E1165 Type 2 diabetes mellitus with hyperglycemia: Secondary | ICD-10-CM | POA: Diagnosis not present

## 2023-01-15 DIAGNOSIS — I10 Essential (primary) hypertension: Secondary | ICD-10-CM | POA: Diagnosis not present

## 2023-01-15 DIAGNOSIS — J449 Chronic obstructive pulmonary disease, unspecified: Secondary | ICD-10-CM | POA: Diagnosis not present

## 2023-01-15 DIAGNOSIS — S72451D Displaced supracondylar fracture without intracondylar extension of lower end of right femur, subsequent encounter for closed fracture with routine healing: Secondary | ICD-10-CM | POA: Diagnosis not present

## 2023-01-15 DIAGNOSIS — E785 Hyperlipidemia, unspecified: Secondary | ICD-10-CM | POA: Diagnosis not present

## 2023-01-15 DIAGNOSIS — F411 Generalized anxiety disorder: Secondary | ICD-10-CM | POA: Diagnosis not present

## 2023-01-15 DIAGNOSIS — D62 Acute posthemorrhagic anemia: Secondary | ICD-10-CM | POA: Diagnosis not present

## 2023-01-15 DIAGNOSIS — J9611 Chronic respiratory failure with hypoxia: Secondary | ICD-10-CM | POA: Diagnosis not present

## 2023-01-15 DIAGNOSIS — E1169 Type 2 diabetes mellitus with other specified complication: Secondary | ICD-10-CM | POA: Diagnosis not present

## 2023-01-16 DIAGNOSIS — S72451D Displaced supracondylar fracture without intracondylar extension of lower end of right femur, subsequent encounter for closed fracture with routine healing: Secondary | ICD-10-CM | POA: Diagnosis not present

## 2023-01-16 DIAGNOSIS — E785 Hyperlipidemia, unspecified: Secondary | ICD-10-CM | POA: Diagnosis not present

## 2023-01-16 DIAGNOSIS — J449 Chronic obstructive pulmonary disease, unspecified: Secondary | ICD-10-CM | POA: Diagnosis not present

## 2023-01-16 DIAGNOSIS — F411 Generalized anxiety disorder: Secondary | ICD-10-CM | POA: Diagnosis not present

## 2023-01-16 DIAGNOSIS — J9611 Chronic respiratory failure with hypoxia: Secondary | ICD-10-CM | POA: Diagnosis not present

## 2023-01-16 DIAGNOSIS — E1165 Type 2 diabetes mellitus with hyperglycemia: Secondary | ICD-10-CM | POA: Diagnosis not present

## 2023-01-16 DIAGNOSIS — D62 Acute posthemorrhagic anemia: Secondary | ICD-10-CM | POA: Diagnosis not present

## 2023-01-16 DIAGNOSIS — I10 Essential (primary) hypertension: Secondary | ICD-10-CM | POA: Diagnosis not present

## 2023-01-16 DIAGNOSIS — E1169 Type 2 diabetes mellitus with other specified complication: Secondary | ICD-10-CM | POA: Diagnosis not present

## 2023-01-20 DIAGNOSIS — N3020 Other chronic cystitis without hematuria: Secondary | ICD-10-CM | POA: Diagnosis not present

## 2023-01-21 DIAGNOSIS — S72451D Displaced supracondylar fracture without intracondylar extension of lower end of right femur, subsequent encounter for closed fracture with routine healing: Secondary | ICD-10-CM | POA: Diagnosis not present

## 2023-01-22 DIAGNOSIS — S72451D Displaced supracondylar fracture without intracondylar extension of lower end of right femur, subsequent encounter for closed fracture with routine healing: Secondary | ICD-10-CM | POA: Diagnosis not present

## 2023-01-22 DIAGNOSIS — I10 Essential (primary) hypertension: Secondary | ICD-10-CM | POA: Diagnosis not present

## 2023-01-22 DIAGNOSIS — E785 Hyperlipidemia, unspecified: Secondary | ICD-10-CM | POA: Diagnosis not present

## 2023-01-22 DIAGNOSIS — F411 Generalized anxiety disorder: Secondary | ICD-10-CM | POA: Diagnosis not present

## 2023-01-22 DIAGNOSIS — J449 Chronic obstructive pulmonary disease, unspecified: Secondary | ICD-10-CM | POA: Diagnosis not present

## 2023-01-22 DIAGNOSIS — J9611 Chronic respiratory failure with hypoxia: Secondary | ICD-10-CM | POA: Diagnosis not present

## 2023-01-22 DIAGNOSIS — D62 Acute posthemorrhagic anemia: Secondary | ICD-10-CM | POA: Diagnosis not present

## 2023-01-22 DIAGNOSIS — E1165 Type 2 diabetes mellitus with hyperglycemia: Secondary | ICD-10-CM | POA: Diagnosis not present

## 2023-01-22 DIAGNOSIS — E1169 Type 2 diabetes mellitus with other specified complication: Secondary | ICD-10-CM | POA: Diagnosis not present

## 2023-01-24 DIAGNOSIS — J9611 Chronic respiratory failure with hypoxia: Secondary | ICD-10-CM | POA: Diagnosis not present

## 2023-01-24 DIAGNOSIS — J449 Chronic obstructive pulmonary disease, unspecified: Secondary | ICD-10-CM | POA: Diagnosis not present

## 2023-01-24 DIAGNOSIS — E785 Hyperlipidemia, unspecified: Secondary | ICD-10-CM | POA: Diagnosis not present

## 2023-01-24 DIAGNOSIS — D62 Acute posthemorrhagic anemia: Secondary | ICD-10-CM | POA: Diagnosis not present

## 2023-01-24 DIAGNOSIS — F411 Generalized anxiety disorder: Secondary | ICD-10-CM | POA: Diagnosis not present

## 2023-01-24 DIAGNOSIS — E1169 Type 2 diabetes mellitus with other specified complication: Secondary | ICD-10-CM | POA: Diagnosis not present

## 2023-01-24 DIAGNOSIS — E1165 Type 2 diabetes mellitus with hyperglycemia: Secondary | ICD-10-CM | POA: Diagnosis not present

## 2023-01-24 DIAGNOSIS — S72451D Displaced supracondylar fracture without intracondylar extension of lower end of right femur, subsequent encounter for closed fracture with routine healing: Secondary | ICD-10-CM | POA: Diagnosis not present

## 2023-01-24 DIAGNOSIS — I10 Essential (primary) hypertension: Secondary | ICD-10-CM | POA: Diagnosis not present

## 2023-01-29 DIAGNOSIS — J9611 Chronic respiratory failure with hypoxia: Secondary | ICD-10-CM | POA: Diagnosis not present

## 2023-01-29 DIAGNOSIS — J449 Chronic obstructive pulmonary disease, unspecified: Secondary | ICD-10-CM | POA: Diagnosis not present

## 2023-01-29 DIAGNOSIS — E785 Hyperlipidemia, unspecified: Secondary | ICD-10-CM | POA: Diagnosis not present

## 2023-01-29 DIAGNOSIS — I10 Essential (primary) hypertension: Secondary | ICD-10-CM | POA: Diagnosis not present

## 2023-01-29 DIAGNOSIS — D62 Acute posthemorrhagic anemia: Secondary | ICD-10-CM | POA: Diagnosis not present

## 2023-01-29 DIAGNOSIS — S72451D Displaced supracondylar fracture without intracondylar extension of lower end of right femur, subsequent encounter for closed fracture with routine healing: Secondary | ICD-10-CM | POA: Diagnosis not present

## 2023-01-29 DIAGNOSIS — E1165 Type 2 diabetes mellitus with hyperglycemia: Secondary | ICD-10-CM | POA: Diagnosis not present

## 2023-01-29 DIAGNOSIS — F411 Generalized anxiety disorder: Secondary | ICD-10-CM | POA: Diagnosis not present

## 2023-01-29 DIAGNOSIS — E1169 Type 2 diabetes mellitus with other specified complication: Secondary | ICD-10-CM | POA: Diagnosis not present

## 2023-01-30 DIAGNOSIS — E785 Hyperlipidemia, unspecified: Secondary | ICD-10-CM | POA: Diagnosis not present

## 2023-01-30 DIAGNOSIS — J449 Chronic obstructive pulmonary disease, unspecified: Secondary | ICD-10-CM | POA: Diagnosis not present

## 2023-01-30 DIAGNOSIS — F411 Generalized anxiety disorder: Secondary | ICD-10-CM | POA: Diagnosis not present

## 2023-01-30 DIAGNOSIS — S72451D Displaced supracondylar fracture without intracondylar extension of lower end of right femur, subsequent encounter for closed fracture with routine healing: Secondary | ICD-10-CM | POA: Diagnosis not present

## 2023-01-30 DIAGNOSIS — I10 Essential (primary) hypertension: Secondary | ICD-10-CM | POA: Diagnosis not present

## 2023-01-30 DIAGNOSIS — J9611 Chronic respiratory failure with hypoxia: Secondary | ICD-10-CM | POA: Diagnosis not present

## 2023-01-30 DIAGNOSIS — E1165 Type 2 diabetes mellitus with hyperglycemia: Secondary | ICD-10-CM | POA: Diagnosis not present

## 2023-01-30 DIAGNOSIS — D62 Acute posthemorrhagic anemia: Secondary | ICD-10-CM | POA: Diagnosis not present

## 2023-01-30 DIAGNOSIS — E1169 Type 2 diabetes mellitus with other specified complication: Secondary | ICD-10-CM | POA: Diagnosis not present

## 2023-02-05 DIAGNOSIS — S72451D Displaced supracondylar fracture without intracondylar extension of lower end of right femur, subsequent encounter for closed fracture with routine healing: Secondary | ICD-10-CM | POA: Diagnosis not present

## 2023-02-05 DIAGNOSIS — E1165 Type 2 diabetes mellitus with hyperglycemia: Secondary | ICD-10-CM | POA: Diagnosis not present

## 2023-02-05 DIAGNOSIS — E785 Hyperlipidemia, unspecified: Secondary | ICD-10-CM | POA: Diagnosis not present

## 2023-02-05 DIAGNOSIS — F411 Generalized anxiety disorder: Secondary | ICD-10-CM | POA: Diagnosis not present

## 2023-02-05 DIAGNOSIS — E1169 Type 2 diabetes mellitus with other specified complication: Secondary | ICD-10-CM | POA: Diagnosis not present

## 2023-02-05 DIAGNOSIS — D62 Acute posthemorrhagic anemia: Secondary | ICD-10-CM | POA: Diagnosis not present

## 2023-02-05 DIAGNOSIS — J449 Chronic obstructive pulmonary disease, unspecified: Secondary | ICD-10-CM | POA: Diagnosis not present

## 2023-02-05 DIAGNOSIS — J9611 Chronic respiratory failure with hypoxia: Secondary | ICD-10-CM | POA: Diagnosis not present

## 2023-02-05 DIAGNOSIS — I10 Essential (primary) hypertension: Secondary | ICD-10-CM | POA: Diagnosis not present

## 2023-02-07 ENCOUNTER — Ambulatory Visit: Payer: Medicare HMO

## 2023-02-07 DIAGNOSIS — F411 Generalized anxiety disorder: Secondary | ICD-10-CM | POA: Diagnosis not present

## 2023-02-07 DIAGNOSIS — J9611 Chronic respiratory failure with hypoxia: Secondary | ICD-10-CM | POA: Diagnosis not present

## 2023-02-07 DIAGNOSIS — I10 Essential (primary) hypertension: Secondary | ICD-10-CM | POA: Diagnosis not present

## 2023-02-07 DIAGNOSIS — S72451D Displaced supracondylar fracture without intracondylar extension of lower end of right femur, subsequent encounter for closed fracture with routine healing: Secondary | ICD-10-CM | POA: Diagnosis not present

## 2023-02-07 DIAGNOSIS — J449 Chronic obstructive pulmonary disease, unspecified: Secondary | ICD-10-CM | POA: Diagnosis not present

## 2023-02-07 DIAGNOSIS — E1169 Type 2 diabetes mellitus with other specified complication: Secondary | ICD-10-CM | POA: Diagnosis not present

## 2023-02-07 DIAGNOSIS — D62 Acute posthemorrhagic anemia: Secondary | ICD-10-CM | POA: Diagnosis not present

## 2023-02-07 DIAGNOSIS — E785 Hyperlipidemia, unspecified: Secondary | ICD-10-CM | POA: Diagnosis not present

## 2023-02-07 DIAGNOSIS — E1165 Type 2 diabetes mellitus with hyperglycemia: Secondary | ICD-10-CM | POA: Diagnosis not present

## 2023-02-07 NOTE — Progress Notes (Unsigned)
Patient daughter cancelled requested to rescheule

## 2023-02-12 DIAGNOSIS — E1165 Type 2 diabetes mellitus with hyperglycemia: Secondary | ICD-10-CM | POA: Diagnosis not present

## 2023-02-12 DIAGNOSIS — E1169 Type 2 diabetes mellitus with other specified complication: Secondary | ICD-10-CM | POA: Diagnosis not present

## 2023-02-12 DIAGNOSIS — D62 Acute posthemorrhagic anemia: Secondary | ICD-10-CM | POA: Diagnosis not present

## 2023-02-12 DIAGNOSIS — J9611 Chronic respiratory failure with hypoxia: Secondary | ICD-10-CM | POA: Diagnosis not present

## 2023-02-12 DIAGNOSIS — I10 Essential (primary) hypertension: Secondary | ICD-10-CM | POA: Diagnosis not present

## 2023-02-12 DIAGNOSIS — F411 Generalized anxiety disorder: Secondary | ICD-10-CM | POA: Diagnosis not present

## 2023-02-12 DIAGNOSIS — J449 Chronic obstructive pulmonary disease, unspecified: Secondary | ICD-10-CM | POA: Diagnosis not present

## 2023-02-12 DIAGNOSIS — E785 Hyperlipidemia, unspecified: Secondary | ICD-10-CM | POA: Diagnosis not present

## 2023-02-12 DIAGNOSIS — S72451D Displaced supracondylar fracture without intracondylar extension of lower end of right femur, subsequent encounter for closed fracture with routine healing: Secondary | ICD-10-CM | POA: Diagnosis not present

## 2023-02-13 DIAGNOSIS — D62 Acute posthemorrhagic anemia: Secondary | ICD-10-CM | POA: Diagnosis not present

## 2023-02-13 DIAGNOSIS — E785 Hyperlipidemia, unspecified: Secondary | ICD-10-CM | POA: Diagnosis not present

## 2023-02-13 DIAGNOSIS — J449 Chronic obstructive pulmonary disease, unspecified: Secondary | ICD-10-CM | POA: Diagnosis not present

## 2023-02-13 DIAGNOSIS — F411 Generalized anxiety disorder: Secondary | ICD-10-CM | POA: Diagnosis not present

## 2023-02-13 DIAGNOSIS — E1165 Type 2 diabetes mellitus with hyperglycemia: Secondary | ICD-10-CM | POA: Diagnosis not present

## 2023-02-13 DIAGNOSIS — I10 Essential (primary) hypertension: Secondary | ICD-10-CM | POA: Diagnosis not present

## 2023-02-13 DIAGNOSIS — J9611 Chronic respiratory failure with hypoxia: Secondary | ICD-10-CM | POA: Diagnosis not present

## 2023-02-13 DIAGNOSIS — S72451D Displaced supracondylar fracture without intracondylar extension of lower end of right femur, subsequent encounter for closed fracture with routine healing: Secondary | ICD-10-CM | POA: Diagnosis not present

## 2023-02-13 DIAGNOSIS — E1169 Type 2 diabetes mellitus with other specified complication: Secondary | ICD-10-CM | POA: Diagnosis not present

## 2023-02-14 ENCOUNTER — Ambulatory Visit: Payer: Medicare HMO

## 2023-02-16 ENCOUNTER — Encounter: Payer: Self-pay | Admitting: Pharmacist

## 2023-02-16 NOTE — Progress Notes (Signed)
Pharmacy Quality Measure Review  This patient is appearing on a report for being at risk of failing the adherence measure for cholesterol (statin) medications this calendar year.   Medication: rosuvastatin 40 mg daily Last fill date: 01/14/23 for 90 day supply  Insurance report was not up to date. Pt appearing on insurance report for non-adherence to atorvastatin, but has been switched to rosuvastatin. No action needed at this time.   Adam Phenix, PharmD PGY-1 Pharmacy Resident

## 2023-02-18 DIAGNOSIS — I10 Essential (primary) hypertension: Secondary | ICD-10-CM | POA: Diagnosis not present

## 2023-02-18 DIAGNOSIS — F411 Generalized anxiety disorder: Secondary | ICD-10-CM | POA: Diagnosis not present

## 2023-02-18 DIAGNOSIS — E785 Hyperlipidemia, unspecified: Secondary | ICD-10-CM | POA: Diagnosis not present

## 2023-02-18 DIAGNOSIS — J9611 Chronic respiratory failure with hypoxia: Secondary | ICD-10-CM | POA: Diagnosis not present

## 2023-02-18 DIAGNOSIS — D62 Acute posthemorrhagic anemia: Secondary | ICD-10-CM | POA: Diagnosis not present

## 2023-02-18 DIAGNOSIS — E1165 Type 2 diabetes mellitus with hyperglycemia: Secondary | ICD-10-CM | POA: Diagnosis not present

## 2023-02-18 DIAGNOSIS — S72451D Displaced supracondylar fracture without intracondylar extension of lower end of right femur, subsequent encounter for closed fracture with routine healing: Secondary | ICD-10-CM | POA: Diagnosis not present

## 2023-02-18 DIAGNOSIS — E1169 Type 2 diabetes mellitus with other specified complication: Secondary | ICD-10-CM | POA: Diagnosis not present

## 2023-02-18 DIAGNOSIS — J449 Chronic obstructive pulmonary disease, unspecified: Secondary | ICD-10-CM | POA: Diagnosis not present

## 2023-02-19 DIAGNOSIS — I10 Essential (primary) hypertension: Secondary | ICD-10-CM | POA: Diagnosis not present

## 2023-02-19 DIAGNOSIS — J9611 Chronic respiratory failure with hypoxia: Secondary | ICD-10-CM | POA: Diagnosis not present

## 2023-02-19 DIAGNOSIS — E785 Hyperlipidemia, unspecified: Secondary | ICD-10-CM | POA: Diagnosis not present

## 2023-02-19 DIAGNOSIS — F411 Generalized anxiety disorder: Secondary | ICD-10-CM | POA: Diagnosis not present

## 2023-02-19 DIAGNOSIS — E1165 Type 2 diabetes mellitus with hyperglycemia: Secondary | ICD-10-CM | POA: Diagnosis not present

## 2023-02-19 DIAGNOSIS — J449 Chronic obstructive pulmonary disease, unspecified: Secondary | ICD-10-CM | POA: Diagnosis not present

## 2023-02-19 DIAGNOSIS — E1169 Type 2 diabetes mellitus with other specified complication: Secondary | ICD-10-CM | POA: Diagnosis not present

## 2023-02-19 DIAGNOSIS — D62 Acute posthemorrhagic anemia: Secondary | ICD-10-CM | POA: Diagnosis not present

## 2023-02-19 DIAGNOSIS — S72451D Displaced supracondylar fracture without intracondylar extension of lower end of right femur, subsequent encounter for closed fracture with routine healing: Secondary | ICD-10-CM | POA: Diagnosis not present

## 2023-02-24 MED ORDER — AZITHROMYCIN 250 MG PO TABS: ORAL_TABLET | ORAL | 0 refills | Status: DC

## 2023-02-26 ENCOUNTER — Ambulatory Visit (INDEPENDENT_AMBULATORY_CARE_PROVIDER_SITE_OTHER): Payer: Medicare HMO

## 2023-02-26 VITALS — Ht 70.0 in | Wt 199.0 lb

## 2023-02-26 DIAGNOSIS — E1169 Type 2 diabetes mellitus with other specified complication: Secondary | ICD-10-CM | POA: Diagnosis not present

## 2023-02-26 DIAGNOSIS — Z0000 Encounter for general adult medical examination without abnormal findings: Secondary | ICD-10-CM | POA: Diagnosis not present

## 2023-02-26 DIAGNOSIS — E1165 Type 2 diabetes mellitus with hyperglycemia: Secondary | ICD-10-CM | POA: Diagnosis not present

## 2023-02-26 DIAGNOSIS — E785 Hyperlipidemia, unspecified: Secondary | ICD-10-CM | POA: Diagnosis not present

## 2023-02-26 DIAGNOSIS — Z122 Encounter for screening for malignant neoplasm of respiratory organs: Secondary | ICD-10-CM

## 2023-02-26 DIAGNOSIS — S72451D Displaced supracondylar fracture without intracondylar extension of lower end of right femur, subsequent encounter for closed fracture with routine healing: Secondary | ICD-10-CM | POA: Diagnosis not present

## 2023-02-26 DIAGNOSIS — I10 Essential (primary) hypertension: Secondary | ICD-10-CM | POA: Diagnosis not present

## 2023-02-26 DIAGNOSIS — J9611 Chronic respiratory failure with hypoxia: Secondary | ICD-10-CM | POA: Diagnosis not present

## 2023-02-26 DIAGNOSIS — D62 Acute posthemorrhagic anemia: Secondary | ICD-10-CM | POA: Diagnosis not present

## 2023-02-26 DIAGNOSIS — F411 Generalized anxiety disorder: Secondary | ICD-10-CM | POA: Diagnosis not present

## 2023-02-26 DIAGNOSIS — J449 Chronic obstructive pulmonary disease, unspecified: Secondary | ICD-10-CM | POA: Diagnosis not present

## 2023-02-26 NOTE — Progress Notes (Signed)
Subjective:   Caitlin Mccarthy is a 87 y.o. female who presents for Medicare Annual (Subsequent) preventive examination.  Visit Complete: Virtual  I connected with  [name] on 02/26/23 by a audio enabled telemedicine application and verified that I am speaking with the correct person using two identifiers.  Patient Location: Home  Provider Location: Home Office  I discussed the limitations of evaluation and management by telemedicine. The patient expressed understanding and agreed to proceed.  Patient Medicare AWV questionnaire was completed by the patient on 02/26/2023; I have confirmed that all information answered by patient is correct and no changes since this date.  Review of Systems    Vital Signs: Unable to obtain new vitals due to this being a telehealth visit.  Cardiac Risk Factors include: advanced age (>33men, >66 women);diabetes mellitus;dyslipidemia;hypertensionNutrition Risk Assessment:  Has the patient had any N/V/D within the last 2 months?  No  Does the patient have any non-healing wounds?  No  Has the patient had any unintentional weight loss or weight gain?  No   Diabetes:  Is the patient diabetic?  Yes  If diabetic, was a CBG obtained today?  No  Did the patient bring in their glucometer from home?  No  How often do you monitor your CBG's? Daily .   Financial Strains and Diabetes Management:  Are you having any financial strains with the device, your supplies or your medication? No .  Does the patient want to be seen by Chronic Care Management for management of their diabetes?  No  Would the patient like to be referred to a Nutritionist or for Diabetic Management?  No   Diabetic Exams:  Diabetic Eye Exam: Overdue for diabetic eye exam. Pt has been advised about the importance in completing this exam. Patient advised to call and schedule an eye exam. Diabetic Foot Exam: Overdue, Pt has been advised about the importance in completing this exam.  Pt is scheduled for diabetic foot exam on next office visit .      Objective:    Today's Vitals   02/26/23 1327  Weight: 199 lb (90.3 kg)  Height: 5\' 10"  (1.778 m)   Body mass index is 28.55 kg/m.     02/26/2023    1:32 PM 11/27/2022    2:23 AM 11/26/2022    7:21 PM 11/02/2022    8:00 PM 11/02/2022    2:32 PM 02/05/2022    1:23 PM 02/02/2021    1:32 PM  Advanced Directives  Does Patient Have a Medical Advance Directive? Yes  No Yes Yes Yes Yes  Type of Estate agent of [place];Living will    Healthcare Power of [place];Living will Healthcare Power of [place];Living will Healthcare Power of [place];Living will  Does patient want to make changes to medical advance directive?    Yes (Inpatient - patient defers changing a medical advance directive at this time - Information given)     Copy of Healthcare Power of Attorney in Chart? No - copy requested     No - copy requested No - copy requested  Would patient like information on creating a medical advance directive?  No - Patient declined         Current Medications (verified) Outpatient Encounter Medications as of 02/26/2023  Medication Sig   Alcohol Swabs (B-D SINGLE USE SWABS REGULAR) PADS Test BS daily and as needed Dx E11.65   amLODipine-benazepril (LOTREL) 10-40 MG capsule Take 1 capsule by mouth daily.   aspirin  EC 81 MG tablet Take 1 tablet (81 mg total) by mouth daily.   azithromycin (ZITHROMAX Z-PAK) 250 MG tablet As directed   baclofen (LIORESAL) 10 MG tablet Take 1 tablet (10 mg total) by mouth 3 (three) times daily.   Blood Glucose Calibration (TRUE METRIX LEVEL 1) Low SOLN Use with glucose monitor Dx E11.65   Blood Glucose Monitoring Suppl (TRUE METRIX AIR GLUCOSE METER) w/Device KIT Test BS daily and as needed Dx E11.65   docusate sodium (COLACE) 100 MG capsule Take 1 capsule (100 mg total) by mouth 2 (two) times daily.   fenofibrate (TRICOR) 145 MG tablet TAKE ONE (1) TABLET BY MOUTH EVERY DAY    glucose blood (TRUE METRIX BLOOD GLUCOSE TEST) test strip Test BS daily and as needed Dx E11.65   LORazepam (ATIVAN) 0.5 MG tablet Take 1 tablet (0.5 mg total) by mouth 2 (two) times daily as needed. for anxiety   metFORMIN (GLUCOPHAGE-XR) 500 MG 24 hr tablet TAKE 1 TABLET BY MOUTH EVERY MORNING & TAKE 1 TABLET BY MOUTH AT BEDTIME   nystatin (MYCOSTATIN/NYSTOP) powder APPLY TOPICALLY FOUR TIMES DAILY (Patient taking differently: Apply 1 Application topically 4 (four) times daily.)   ondansetron (ZOFRAN-ODT) 4 MG disintegrating tablet TAKE 1 TABLET EVERY 8 HOURS AS NEEDED FOR NAUSEA AND VOMITING   oxyCODONE (OXY IR/ROXICODONE) 5 MG immediate release tablet Take 1 tablet (5 mg total) by mouth every 4 (four) hours as needed for severe pain.   OXYGEN Inhale 2 L into the lungs continuous as needed.   polyethylene glycol (MIRALAX / GLYCOLAX) 17 g packet Take 17 g by mouth daily.   PREMARIN vaginal cream Apply 1 application topically daily as needed (irritation).    rosuvastatin (CRESTOR) 40 MG tablet TAKE ONE (1) TABLET EACH DAY   traMADol (ULTRAM) 50 MG tablet Take 1-2 tablets (50-100 mg total) by mouth every 6 (six) hours as needed for moderate pain or severe pain.   TRUEplus Lancets 33G MISC Test BS daily and as needed Dx E11.65   Vitamin D, Ergocalciferol, (DRISDOL) 1.25 MG (50000 UNIT) CAPS capsule Take 1 capsule (50,000 Units total) by mouth every 7 (seven) days.   apixaban (ELIQUIS) 2.5 MG TABS tablet Take 1 tablet (2.5 mg total) by mouth 2 (two) times daily for 26 days.   No facility-administered encounter medications on file as of 02/26/2023.    Allergies (verified) Gabapentin   History: Past Medical History:  Diagnosis Date   Anxiety    Depression    GERD (gastroesophageal reflux disease)    History of acute pyelonephritis    last episode 04-13-2015  w/ sepsis   History of kidney stones    History of recurrent UTIs    MULTIPLE   Hyperlipidemia    Hypertension    OA  (osteoarthritis)    knees, hips, hands   Osteoporosis    Poor memory    especially when has uti   Renal calculus, left    Stroke (HCC)    Type 2 diabetes mellitus (HCC)    Vitamin D deficiency 05/10/2016   Past Surgical History:  Procedure Laterality Date   CATARACT EXTRACTION W/PHACO  06/06/2011   Procedure: CATARACT EXTRACTION PHACO AND INTRAOCULAR LENS PLACEMENT (IOC);  Surgeon: Gemma Payor;  Location: [facility];  Service: Ophthalmology;  Laterality: Right;  CDE=12.77   CATARACT EXTRACTION W/PHACO  06/27/2011   Procedure: CATARACT EXTRACTION PHACO AND INTRAOCULAR LENS PLACEMENT (IOC);  Surgeon: Gemma Payor;  Location: [facility];  Service: Ophthalmology;  Laterality: Left;  CDE:13.96   CYSTOSCOPY WITH URETEROSCOPY AND STENT PLACEMENT Right 04/27/2015   Procedure: CYSTOSCOPY WITH RIGHT URETEROSCOPY, BASKET REMOVAL OF STONE, REMOVAL OF RIGHT NEPHROSTOMY TUBE;  Surgeon: Bjorn Pippin, MD;  Location[facility];  Service: Urology;  Laterality: Right;   CYSTOSCOPY/URETEROSCOPY/HOLMIUM LASER/STENT PLACEMENT Left 12/24/2016   Procedure: LEFT URETEROSCOPY WITH HOLMIUM LASER AND STENT PLACEMENT;  Surgeon: Bjorn Pippin, MD;  Location: [facility];  Service: Urology;  Laterality: Left;   EXTRACORPOREAL SHOCK WAVE LITHOTRIPSY  left 04-01-2016;  1980's   FRACTURE SURGERY     HIP ARTHROPLASTY     HOLMIUM LASER APPLICATION Right 04/27/2015   Procedure: HOLMIUM LASER APPLICATION;  Surgeon: Bjorn Pippin, MD;  Location[facility];  Service: Urology;  Laterality: Right;   KNEE ARTHROPLASTY     KNEE ARTHROSCOPY Bilateral right 2005//  left ?   LOOP RECORDER INSERTION N/A 04/01/2017   Procedure: LOOP RECORDER INSERTION;  Surgeon: Hillis Range, MD;  Location: [facility];  Service: Cardiovascular;  Laterality: N/A;   ORIF FEMUR FRACTURE Right 11/27/2022   Procedure: OPEN REDUCTION INTERNAL FIXATION (ORIF) DISTAL FEMUR FRACTURE;  Surgeon: Roby Lofts, MD;  Location:  [facility];  Service: Orthopedics;  Laterality: Right;   TOTAL HIP ARTHROPLASTY  10/28/2011   Procedure: TOTAL HIP ARTHROPLASTY;  Surgeon: Loanne Drilling, MD;  Location: [facility];  Service: Orthopedics;  Laterality: Right;   TOTAL KNEE ARTHROPLASTY Bilateral left 03-09-2007//  right 2006   TRANSTHORACIC ECHOCARDIOGRAM  10/06/2006   normal echo,  ef 55-60%   TUBAL LIGATION     Family History  Problem Relation Age of Onset   Kidney disease Mother    Diabetes Mother    Congestive Heart Failure Father    Heart failure Father    Heart disease Brother    Alcohol abuse Brother    Anesthesia problems Neg Hx    Hypotension Neg Hx    Malignant hyperthermia Neg Hx    Pseudochol deficiency Neg Hx    Social History   Socioeconomic History   Marital status: Married    Spouse name: Ted   Number of children: 2   Years of education: 11   Highest education level: Not on file  Occupational History   Occupation: Retired    Comment: retired  Tobacco Use   Smoking status: Former    Current packs/day: 0.00    Average packs/day: 0.3 packs/day for 42.0 years (10.5 ttl pk-yrs)    Types: Cigarettes    Start date: 10/31/1980    Quit date: 11/01/2022    Years since quitting: 0.3   Smokeless tobacco: Never   Tobacco comments:    1 pack last 4 days   Vaping Use   Vaping status: Never Used  Substance and Sexual Activity   Alcohol use: No   Drug use: No   Sexual activity: Not Currently    Birth control/protection: Post-menopausal  Other Topics Concern   Not on file  Social History Narrative   Lives with husband   Her daughters take turns coming daily.   Social Determinants of Health   Financial Resource Strain: Low Risk  (02/26/2023)   Overall Financial Resource Strain (CARDIA)    Difficulty of Paying Living Expenses: Not hard at all  Food Insecurity: No Food Insecurity (02/26/2023)   Hunger Vital Sign    Worried About Running Out of Food in the Last Year: Never true    Ran Out of Food in the Last  Year:  Never true  Transportation Needs: No Transportation Needs (02/26/2023)   PRAPARE - Administrator, Civil Service (Medical): No    Lack of Transportation (Non-Medical): No  Physical Activity: Insufficiently Active (02/26/2023)   Exercise Vital Sign    Days of Exercise per Week: 3 days    Minutes of Exercise per Session: 30 min  Stress: No Stress Concern Present (02/26/2023)   [employer] of Occupational Health - Occupational Stress Questionnaire    Feeling of Stress : Not at all  Social Connections: Moderately Integrated (02/26/2023)   Social Connection and Isolation Panel [NHANES]    Frequency of Communication with Friends and Family: More than three times a week    Frequency of Social Gatherings with Friends and Family: More than three times a week    Attends Religious Services: More than 4 times per year    Active Member of [employer] or Organizations: No    Attends Engineer, structural: Never    Marital Status: Married    Tobacco Counseling Counseling given: Not Answered Tobacco comments: 1 pack last 4 days    Clinical Intake:  Pre-visit preparation completed: Yes  Pain : No/denies pain     Nutritional Risks: None Diabetes: Yes CBG done?: No Did pt. bring in CBG monitor from home?: No  How often do you need to have someone help you when you read instructions, pamphlets, or other written materials from your doctor or pharmacy?: 1 - Never  Interpreter Needed?: No  Information entered by :: Renie Ora, LPN   Activities of Daily Living    02/26/2023    1:32 PM 11/27/2022    1:00 AM  In your present state of health, do you have any difficulty performing the following activities:  Hearing? 0 0  Vision? 0 0  Difficulty concentrating or making decisions? 0 0  Walking or climbing stairs? 0 0  Dressing or bathing? 0 0  Doing errands, shopping? 0 0  Preparing Food and eating ? N   Using the Toilet? N   In the past six months, have you  accidently leaked urine? N   Do you have problems with loss of bowel control? N   Managing your Medications? N   Managing your Finances? N   Housekeeping or managing your Housekeeping? N     Patient Care Team: Bennie Pierini, FNP as PCP - General (Family Medicine) Laqueta Linden, MD (Inactive) as PCP - Cardiology (Cardiology) Michaelle Copas, MD as Referring Physician (Optometry)  Indicate any recent Medical Services you may have received from other than [facility] providers in the past year (date may be approximate).     Assessment:   This is a routine wellness examination for [place].  Hearing/Vision screen Vision Screening - Comments:: Will call scheduled when she completes therapy from fall   Dietary issues and exercise activities discussed:     Goals Addressed             This Visit's Progress    DIET - INCREASE WATER INTAKE         Depression Screen    02/26/2023    1:31 PM 11/18/2022   11:45 AM 08/30/2022   11:45 AM 02/26/2022   12:13 PM 02/05/2022    1:52 PM 11/22/2021   12:11 PM 08/13/2021    2:13 PM  PHQ 2/9 Scores  PHQ - 2 Score 0 4 4 4  0 4 4  PHQ- 9 Score  12 12 15  13  16  Fall Risk    02/26/2023    1:29 PM 11/18/2022   11:45 AM 08/30/2022   11:45 AM 02/26/2022   12:13 PM 02/05/2022    1:19 PM  Fall Risk   Falls in the past year? 1 0 0 0 0  Number falls in past yr: 1    0  Injury with Fall? 1    0  Risk for fall due to : History of fall(s);Impaired balance/gait;Orthopedic patient    Orthopedic patient;Impaired balance/gait  Follow up Education provided;Falls prevention discussed;Falls evaluation completed    Falls prevention discussed;Education provided    MEDICARE RISK AT HOME: Medicare Risk at Home Any stairs in or around the home?: Yes If so, are there any without handrails?: No Home free of loose throw rugs in walkways, pet beds, electrical cords, etc?: Yes Adequate lighting in your home to reduce risk of falls?: Yes Life alert?:  No Use of a cane, walker or w/c?: Yes Grab bars in the bathroom?: Yes Shower chair or bench in shower?: Yes Elevated toilet seat or a handicapped toilet?: Yes  TIMED UP AND GO:  Was the test performed?  No    Cognitive Function:        02/26/2023    1:33 PM 02/05/2022    1:23 PM 02/02/2021    1:38 PM 01/31/2020    2:47 PM 01/11/2019    2:31 PM  6CIT Screen  What Year? 0 points 0 points 0 points 0 points 0 points  What month? 0 points 0 points 0 points 0 points 0 points  What time? 0 points 0 points 0 points 0 points 0 points  Count back from 20 0 points 0 points 0 points 0 points 0 points  Months in reverse 0 points 4 points 2 points 2 points 0 points  Repeat phrase 0 points 2 points 2 points 2 points 0 points  Total Score 0 points 6 points 4 points 4 points 0 points    Immunizations Immunization History  Administered Date(s) Administered   Fluad Quad(high Dose 65+) 04/06/2019, 03/23/2020, 05/21/2021, 05/31/2022   Influenza, High Dose Seasonal PF 04/21/2017, 04/06/2018   Influenza,inj,Quad PF,6+ Mos 04/02/2013, 04/06/2014, 04/14/2015, 04/05/2016   Influenza,inj,quad, With Preservative 03/31/2017, 03/31/2018   Moderna Sars-Covid-2 Vaccination 08/02/2019, 08/29/2019   Pneumococcal Conjugate-13 11/24/2014   Pneumococcal Polysaccharide-23 06/08/2003, 10/12/2010   Pneumococcal-Unspecified 03/31/2017   Td 10/12/2010   Tdap 10/12/2010   Zoster Recombinant(Shingrix) 02/03/2020, 08/13/2021    TDAP status: Due, Education has been provided regarding the importance of this vaccine. Advised may receive this vaccine at local pharmacy or Health Dept. Aware to provide a copy of the vaccination record if obtained from local pharmacy or Health Dept. Verbalized acceptance and understanding.  Flu Vaccine status: Up to date  Pneumococcal vaccine status: Up to date  Covid-19 vaccine status: Completed vaccines  Qualifies for Shingles Vaccine? Yes   Zostavax completed Yes   Shingrix  Completed?: Yes  Screening Tests Health Maintenance  Topic Date Due   OPHTHALMOLOGY EXAM  05/26/2019   DTaP/Tdap/Td (3 - Td or Tdap) 10/11/2020   Diabetic kidney evaluation - Urine ACR  05/08/2021   FOOT EXAM  02/08/2022   COVID-19 Vaccine (3 - 2023-24 season) 03/01/2022   INFLUENZA VACCINE  01/30/2023   HEMOGLOBIN A1C  05/06/2023   Diabetic kidney evaluation - eGFR measurement  11/30/2023   Medicare Annual Wellness (AWV)  02/26/2024   Pneumonia Vaccine 1+ Years old  Completed   DEXA SCAN  Completed   Zoster  Vaccines- Shingrix  Completed   HPV VACCINES  Aged Out    Health Maintenance  Health Maintenance Due  Topic Date Due   OPHTHALMOLOGY EXAM  05/26/2019   DTaP/Tdap/Td (3 - Td or Tdap) 10/11/2020   Diabetic kidney evaluation - Urine ACR  05/08/2021   FOOT EXAM  02/08/2022   COVID-19 Vaccine (3 - 2023-24 season) 03/01/2022   INFLUENZA VACCINE  01/30/2023    Colorectal cancer screening: No longer required.   Mammogram status: No longer required due to age.  Bone Density status: Ordered referral 11/21/2022. Pt provided with contact info and advised to call to schedule appt.  Lung Cancer Screening: (Low Dose CT Chest recommended if Age 40-80 years, 20 pack-year currently smoking OR have quit w/in 15years.) does qualify.   Lung Cancer Screening Referral: 02/26/2023  Additional Screening:  Hepatitis C Screening: does not qualify;   Vision Screening: Recommended annual ophthalmology exams for early detection of glaucoma and other disorders of the eye. Is the patient up to date with their annual eye exam?  No  Who is the provider or what is the name of the office in which the patient attends annual eye exams? Dr.Le If pt is not established with a provider, would they like to be referred to a provider to establish care? No .   Dental Screening: Recommended annual dental exams for proper oral hygiene   Community Resource Referral / Chronic Care Management: CRR required  this visit?  No   CCM required this visit?  No     Plan:     I have personally reviewed and noted the following in the patient's chart:   Medical and social history Use of alcohol, tobacco or illicit drugs  Current medications and supplements including opioid prescriptions. Patient is not currently taking opioid prescriptions. Functional ability and status Nutritional status Physical activity Advanced directives List of other physicians Hospitalizations, surgeries, and ER visits in previous 12 months Vitals Screenings to include cognitive, depression, and falls Referrals and appointments  In addition, I have reviewed and discussed with patient certain preventive protocols, quality metrics, and best practice recommendations. A written personalized care plan for preventive services as well as general preventive health recommendations were provided to patient.     Lorrene Reid, LPN   [id number]   After Visit Summary: (MyChart) Due to this being a telephonic visit, the after visit summary with patients personalized plan was offered to patient via MyChart   Nurse Notes: Due Tdap Vaccine

## 2023-02-26 NOTE — Patient Instructions (Signed)
Caitlin Mccarthy , Thank you for taking time to come for your Medicare Wellness Visit. I appreciate your ongoing commitment to your health goals. Please review the following plan we discussed and let me know if I can assist you in the future.   Referrals/Orders/Follow-Ups/Clinician Recommendations: Aim for 30 minutes of exercise or brisk walking, 6-8 glasses of water, and 5 servings of fruits and vegetables each day.   This is a list of the screening recommended for you and due dates:  Health Maintenance  Topic Date Due   Eye exam for diabetics  05/26/2019   DTaP/Tdap/Td vaccine (3 - Td or Tdap) 10/11/2020   Yearly kidney health urinalysis for diabetes  05/08/2021   Complete foot exam   02/08/2022   COVID-19 Vaccine (3 - 2023-24 season) 03/01/2022   Flu Shot  01/30/2023   Hemoglobin A1C  05/06/2023   Yearly kidney function blood test for diabetes  11/30/2023   Medicare Annual Wellness Visit  02/26/2024   Pneumonia Vaccine  Completed   DEXA scan (bone density measurement)  Completed   Zoster (Shingles) Vaccine  Completed   HPV Vaccine  Aged Out    Advanced directives: (Copy Requested) Please bring a copy of your health care power of attorney and living will to the office to be added to your chart at your convenience.  Next Medicare Annual Wellness Visit scheduled for next year: Yes  Insert Preventive Care attachment Insert FALL PREVENTION attachment if needed

## 2023-02-28 DIAGNOSIS — E785 Hyperlipidemia, unspecified: Secondary | ICD-10-CM | POA: Diagnosis not present

## 2023-02-28 DIAGNOSIS — S72451D Displaced supracondylar fracture without intracondylar extension of lower end of right femur, subsequent encounter for closed fracture with routine healing: Secondary | ICD-10-CM | POA: Diagnosis not present

## 2023-02-28 DIAGNOSIS — I10 Essential (primary) hypertension: Secondary | ICD-10-CM | POA: Diagnosis not present

## 2023-02-28 DIAGNOSIS — J9611 Chronic respiratory failure with hypoxia: Secondary | ICD-10-CM | POA: Diagnosis not present

## 2023-02-28 DIAGNOSIS — E1165 Type 2 diabetes mellitus with hyperglycemia: Secondary | ICD-10-CM | POA: Diagnosis not present

## 2023-02-28 DIAGNOSIS — J449 Chronic obstructive pulmonary disease, unspecified: Secondary | ICD-10-CM | POA: Diagnosis not present

## 2023-02-28 DIAGNOSIS — D62 Acute posthemorrhagic anemia: Secondary | ICD-10-CM | POA: Diagnosis not present

## 2023-02-28 DIAGNOSIS — E1169 Type 2 diabetes mellitus with other specified complication: Secondary | ICD-10-CM | POA: Diagnosis not present

## 2023-02-28 DIAGNOSIS — F411 Generalized anxiety disorder: Secondary | ICD-10-CM | POA: Diagnosis not present

## 2023-03-04 DIAGNOSIS — S72451D Displaced supracondylar fracture without intracondylar extension of lower end of right femur, subsequent encounter for closed fracture with routine healing: Secondary | ICD-10-CM | POA: Diagnosis not present

## 2023-03-05 DIAGNOSIS — D62 Acute posthemorrhagic anemia: Secondary | ICD-10-CM | POA: Diagnosis not present

## 2023-03-05 DIAGNOSIS — E1165 Type 2 diabetes mellitus with hyperglycemia: Secondary | ICD-10-CM | POA: Diagnosis not present

## 2023-03-05 DIAGNOSIS — S72451D Displaced supracondylar fracture without intracondylar extension of lower end of right femur, subsequent encounter for closed fracture with routine healing: Secondary | ICD-10-CM | POA: Diagnosis not present

## 2023-03-05 DIAGNOSIS — E785 Hyperlipidemia, unspecified: Secondary | ICD-10-CM | POA: Diagnosis not present

## 2023-03-05 DIAGNOSIS — J449 Chronic obstructive pulmonary disease, unspecified: Secondary | ICD-10-CM | POA: Diagnosis not present

## 2023-03-05 DIAGNOSIS — I10 Essential (primary) hypertension: Secondary | ICD-10-CM | POA: Diagnosis not present

## 2023-03-05 DIAGNOSIS — J9611 Chronic respiratory failure with hypoxia: Secondary | ICD-10-CM | POA: Diagnosis not present

## 2023-03-05 DIAGNOSIS — E1169 Type 2 diabetes mellitus with other specified complication: Secondary | ICD-10-CM | POA: Diagnosis not present

## 2023-03-05 DIAGNOSIS — F411 Generalized anxiety disorder: Secondary | ICD-10-CM | POA: Diagnosis not present

## 2023-03-06 DIAGNOSIS — E785 Hyperlipidemia, unspecified: Secondary | ICD-10-CM | POA: Diagnosis not present

## 2023-03-06 DIAGNOSIS — J9611 Chronic respiratory failure with hypoxia: Secondary | ICD-10-CM | POA: Diagnosis not present

## 2023-03-06 DIAGNOSIS — J449 Chronic obstructive pulmonary disease, unspecified: Secondary | ICD-10-CM | POA: Diagnosis not present

## 2023-03-06 DIAGNOSIS — E1169 Type 2 diabetes mellitus with other specified complication: Secondary | ICD-10-CM | POA: Diagnosis not present

## 2023-03-06 DIAGNOSIS — F411 Generalized anxiety disorder: Secondary | ICD-10-CM | POA: Diagnosis not present

## 2023-03-06 DIAGNOSIS — S72451D Displaced supracondylar fracture without intracondylar extension of lower end of right femur, subsequent encounter for closed fracture with routine healing: Secondary | ICD-10-CM | POA: Diagnosis not present

## 2023-03-06 DIAGNOSIS — I10 Essential (primary) hypertension: Secondary | ICD-10-CM | POA: Diagnosis not present

## 2023-03-06 DIAGNOSIS — E1165 Type 2 diabetes mellitus with hyperglycemia: Secondary | ICD-10-CM | POA: Diagnosis not present

## 2023-03-06 DIAGNOSIS — D62 Acute posthemorrhagic anemia: Secondary | ICD-10-CM | POA: Diagnosis not present

## 2023-03-13 DIAGNOSIS — I10 Essential (primary) hypertension: Secondary | ICD-10-CM | POA: Diagnosis not present

## 2023-03-13 DIAGNOSIS — S72451D Displaced supracondylar fracture without intracondylar extension of lower end of right femur, subsequent encounter for closed fracture with routine healing: Secondary | ICD-10-CM | POA: Diagnosis not present

## 2023-03-13 DIAGNOSIS — J449 Chronic obstructive pulmonary disease, unspecified: Secondary | ICD-10-CM | POA: Diagnosis not present

## 2023-03-13 DIAGNOSIS — J9611 Chronic respiratory failure with hypoxia: Secondary | ICD-10-CM | POA: Diagnosis not present

## 2023-03-13 DIAGNOSIS — D62 Acute posthemorrhagic anemia: Secondary | ICD-10-CM | POA: Diagnosis not present

## 2023-03-13 DIAGNOSIS — F411 Generalized anxiety disorder: Secondary | ICD-10-CM | POA: Diagnosis not present

## 2023-03-13 DIAGNOSIS — E785 Hyperlipidemia, unspecified: Secondary | ICD-10-CM | POA: Diagnosis not present

## 2023-03-13 DIAGNOSIS — E1165 Type 2 diabetes mellitus with hyperglycemia: Secondary | ICD-10-CM | POA: Diagnosis not present

## 2023-03-13 DIAGNOSIS — E1169 Type 2 diabetes mellitus with other specified complication: Secondary | ICD-10-CM | POA: Diagnosis not present

## 2023-03-15 DIAGNOSIS — J449 Chronic obstructive pulmonary disease, unspecified: Secondary | ICD-10-CM | POA: Diagnosis not present

## 2023-03-15 DIAGNOSIS — E785 Hyperlipidemia, unspecified: Secondary | ICD-10-CM | POA: Diagnosis not present

## 2023-03-15 DIAGNOSIS — D62 Acute posthemorrhagic anemia: Secondary | ICD-10-CM | POA: Diagnosis not present

## 2023-03-15 DIAGNOSIS — I10 Essential (primary) hypertension: Secondary | ICD-10-CM | POA: Diagnosis not present

## 2023-03-15 DIAGNOSIS — F411 Generalized anxiety disorder: Secondary | ICD-10-CM | POA: Diagnosis not present

## 2023-03-15 DIAGNOSIS — J9611 Chronic respiratory failure with hypoxia: Secondary | ICD-10-CM | POA: Diagnosis not present

## 2023-03-15 DIAGNOSIS — E1169 Type 2 diabetes mellitus with other specified complication: Secondary | ICD-10-CM | POA: Diagnosis not present

## 2023-03-15 DIAGNOSIS — S72451D Displaced supracondylar fracture without intracondylar extension of lower end of right femur, subsequent encounter for closed fracture with routine healing: Secondary | ICD-10-CM | POA: Diagnosis not present

## 2023-03-15 DIAGNOSIS — E1165 Type 2 diabetes mellitus with hyperglycemia: Secondary | ICD-10-CM | POA: Diagnosis not present

## 2023-03-17 ENCOUNTER — Other Ambulatory Visit: Payer: Self-pay | Admitting: Nurse Practitioner

## 2023-03-17 ENCOUNTER — Ambulatory Visit: Payer: Medicare HMO

## 2023-03-17 ENCOUNTER — Encounter: Payer: Self-pay | Admitting: Nurse Practitioner

## 2023-03-17 ENCOUNTER — Ambulatory Visit (INDEPENDENT_AMBULATORY_CARE_PROVIDER_SITE_OTHER): Payer: Medicare HMO | Admitting: Nurse Practitioner

## 2023-03-17 VITALS — BP 142/72 | HR 94 | Temp 97.8°F | Resp 20 | Ht 70.0 in | Wt 180.0 lb

## 2023-03-17 DIAGNOSIS — F411 Generalized anxiety disorder: Secondary | ICD-10-CM | POA: Diagnosis not present

## 2023-03-17 DIAGNOSIS — Z23 Encounter for immunization: Secondary | ICD-10-CM

## 2023-03-17 DIAGNOSIS — Z7984 Long term (current) use of oral hypoglycemic drugs: Secondary | ICD-10-CM

## 2023-03-17 DIAGNOSIS — F339 Major depressive disorder, recurrent, unspecified: Secondary | ICD-10-CM | POA: Diagnosis not present

## 2023-03-17 DIAGNOSIS — I6930 Unspecified sequelae of cerebral infarction: Secondary | ICD-10-CM

## 2023-03-17 DIAGNOSIS — E119 Type 2 diabetes mellitus without complications: Secondary | ICD-10-CM | POA: Diagnosis not present

## 2023-03-17 DIAGNOSIS — J410 Simple chronic bronchitis: Secondary | ICD-10-CM | POA: Diagnosis not present

## 2023-03-17 DIAGNOSIS — I10 Essential (primary) hypertension: Secondary | ICD-10-CM | POA: Diagnosis not present

## 2023-03-17 DIAGNOSIS — M818 Other osteoporosis without current pathological fracture: Secondary | ICD-10-CM

## 2023-03-17 DIAGNOSIS — J9611 Chronic respiratory failure with hypoxia: Secondary | ICD-10-CM

## 2023-03-17 DIAGNOSIS — E1169 Type 2 diabetes mellitus with other specified complication: Secondary | ICD-10-CM | POA: Diagnosis not present

## 2023-03-17 DIAGNOSIS — E663 Overweight: Secondary | ICD-10-CM

## 2023-03-17 DIAGNOSIS — E876 Hypokalemia: Secondary | ICD-10-CM

## 2023-03-17 DIAGNOSIS — E1165 Type 2 diabetes mellitus with hyperglycemia: Secondary | ICD-10-CM | POA: Diagnosis not present

## 2023-03-17 DIAGNOSIS — K219 Gastro-esophageal reflux disease without esophagitis: Secondary | ICD-10-CM | POA: Diagnosis not present

## 2023-03-17 DIAGNOSIS — E559 Vitamin D deficiency, unspecified: Secondary | ICD-10-CM

## 2023-03-17 DIAGNOSIS — Z780 Asymptomatic menopausal state: Secondary | ICD-10-CM

## 2023-03-17 DIAGNOSIS — E785 Hyperlipidemia, unspecified: Secondary | ICD-10-CM | POA: Diagnosis not present

## 2023-03-17 LAB — BAYER DCA HB A1C WAIVED: HB A1C (BAYER DCA - WAIVED): 5.7 % — ABNORMAL HIGH (ref 4.8–5.6)

## 2023-03-17 MED ORDER — AMLODIPINE BESY-BENAZEPRIL HCL 10-40 MG PO CAPS
1.0000 | ORAL_CAPSULE | Freq: Every day | ORAL | 1 refills | Status: DC
Start: 2023-03-17 — End: 2023-09-09

## 2023-03-17 MED ORDER — FENOFIBRATE 145 MG PO TABS: 145.0000 mg | ORAL_TABLET | Freq: Every day | ORAL | 1 refills | Status: DC

## 2023-03-17 MED ORDER — METFORMIN HCL ER 500 MG PO TB24: 500.0000 mg | ORAL_TABLET | Freq: Two times a day (BID) | ORAL | 1 refills | Status: DC

## 2023-03-17 MED ORDER — ERYTHROMYCIN 5 MG/GM OP OINT: 1.0000 | TOPICAL_OINTMENT | Freq: Three times a day (TID) | OPHTHALMIC | 0 refills | Status: DC

## 2023-03-17 MED ORDER — ROSUVASTATIN CALCIUM 40 MG PO TABS
ORAL_TABLET | ORAL | 1 refills | Status: DC
Start: 2023-03-17 — End: 2023-09-09

## 2023-03-17 MED ORDER — LORAZEPAM 0.5 MG PO TABS
0.5000 mg | ORAL_TABLET | Freq: Every day | ORAL | 5 refills | Status: DC
Start: 2023-03-17 — End: 2023-09-09

## 2023-03-17 MED ORDER — CITALOPRAM HYDROBROMIDE 20 MG PO TABS
20.0000 mg | ORAL_TABLET | Freq: Every day | ORAL | 5 refills | Status: DC
Start: 2023-03-17 — End: 2023-09-09

## 2023-03-17 NOTE — Addendum Note (Signed)
Addended by: Bennie Pierini on: 03/17/2023 02:59 PM   Modules accepted: Orders

## 2023-03-17 NOTE — Patient Instructions (Signed)
Fall Prevention in the Home, Adult Falls can cause injuries and can happen to people of all ages. There are many things you can do to make your home safer and to help prevent falls. What actions can I take to prevent falls? General information Use good lighting in all rooms. Make sure to: Replace any light bulbs that burn out. Turn on the lights in dark areas and use night-lights. Keep items that you use often in easy-to-reach places. Lower the shelves around your home if needed. Move furniture so that there are clear paths around it. Do not use throw rugs or other things on the floor that can make you trip. If any of your floors are uneven, fix them. Add color or contrast paint or tape to clearly mark and help you see: Grab bars or handrails. First and last steps of staircases. Where the edge of each step is. If you use a ladder or stepladder: Make sure that it is fully opened. Do not climb a closed ladder. Make sure the sides of the ladder are locked in place. Have someone hold the ladder while you use it. Know where your pets are as you move through your home. What can I do in the bathroom?     Keep the floor dry. Clean up any water on the floor right away. Remove soap buildup in the bathtub or shower. Buildup makes bathtubs and showers slippery. Use non-skid mats or decals on the floor of the bathtub or shower. Attach bath mats securely with double-sided, non-slip rug tape. If you need to sit down in the shower, use a non-slip stool. Install grab bars by the toilet and in the bathtub and shower. Do not use towel bars as grab bars. What can I do in the bedroom? Make sure that you have a light by your bed that is easy to reach. Do not use any sheets or blankets on your bed that hang to the floor. Have a firm chair or bench with side arms that you can use for support when you get dressed. What can I do in the kitchen? Clean up any spills right away. If you need to reach something  above you, use a step stool with a grab bar. Keep electrical cords out of the way. Do not use floor polish or wax that makes floors slippery. What can I do with my stairs? Do not leave anything on the stairs. Make sure that you have a light switch at the top and the bottom of the stairs. Make sure that there are handrails on both sides of the stairs. Fix handrails that are broken or loose. Install non-slip stair treads on all your stairs if they do not have carpet. Avoid having throw rugs at the top or bottom of the stairs. Choose a carpet that does not hide the edge of the steps on the stairs. Make sure that the carpet is firmly attached to the stairs. Fix carpet that is loose or worn. What can I do on the outside of my home? Use bright outdoor lighting. Fix the edges of walkways and driveways and fix any cracks. Clear paths of anything that can make you trip, such as tools or rocks. Add color or contrast paint or tape to clearly mark and help you see anything that might make you trip as you walk through a door, such as a raised step or threshold. Trim any bushes or trees on paths to your home. Check to see if handrails are loose  or broken and that both sides of all steps have handrails. Install guardrails along the edges of any raised decks and porches. Have leaves, snow, or ice cleared regularly. Use sand, salt, or ice melter on paths if you live where there is ice and snow during the winter. Clean up any spills in your garage right away. This includes grease or oil spills. What other actions can I take? Review your medicines with your doctor. Some medicines can cause dizziness or changes in blood pressure, which increase your risk of falling. Wear shoes that: Have a low heel. Do not wear high heels. Have rubber bottoms and are closed at the toe. Feel good on your feet and fit well. Use tools that help you move around if needed. These include: Canes. Walkers. Scooters. Crutches. Ask  your doctor what else you can do to help prevent falls. This may include seeing a physical therapist to learn to do exercises to move better and get stronger. Where to find more information Centers for Disease Control and Prevention, STEADI: TonerPromos.no [employer] on Aging: BaseRingTones.pl National Institute on Aging: BaseRingTones.pl Contact a doctor if: You are afraid of falling at home. You feel weak, drowsy, or dizzy at home. You fall at home. Get help right away if you: Lose consciousness or have trouble moving after a fall. Have a fall that causes a head injury. These symptoms may be an emergency. Get help right away. Call 911. Do not wait to see if the symptoms will go away. Do not drive yourself to the hospital. This information is not intended to replace advice given to you by your health care provider. Make sure you discuss any questions you have with your health care provider. Document Revised: 02/18/2022 Document Reviewed: 02/18/2022 Elsevier Patient Education  2024 [employer].

## 2023-03-17 NOTE — Progress Notes (Signed)
Subjective:    Patient ID: Caitlin Mccarthy, female    DOB: Sep 01, 1935, 87 y.o.   MRN: [id number]   Chief Complaint: medical management of chronic issues     HPI:  Caitlin Mccarthy is a 87 y.o. who identifies as a female who was assigned female at birth.   Social history: Lives with: husband- her daughters are there most of the time Work history: retired   Water engineer in today for follow up of the following chronic medical issues:  1. Essential hypertension, benign No c/o chest pain, sob or headache. Does not check blood pressure at home. BP Readings from Last 3 Encounters:  12/03/22 (!) 141/48  11/21/22 (!) 142/74  11/18/22 137/67     2. Chronic respiratory failure with hypoxia (HCC) 3. Simple chronic bronchitis (HCC) No issues. No cough. Not on oxygen anymore  4. Gastroesophageal reflux disease without esophagitis Uses OTC meds when needed  5. Hyperlipidemia due to type 2 diabetes mellitus (HCC) Eats whatever her family makes her to eat. Lab Results  Component Value Date   CHOL 198 08/30/2022   HDL 45 08/30/2022   LDLCALC 103 (H) 08/30/2022   TRIG 296 (H) 08/30/2022   CHOLHDL 4.4 08/30/2022     6. Type 2 diabetes mellitus with hyperglycemia, without long-term current use of insulin (HCC) Bllod sugars running 120's on average Lab Results  Component Value Date   HGBA1C 6.7 (H) 11/03/2022     7. Episode of recurrent major depressive disorder, unspecified depression episode severity (HCC) Is on no antidepressant.  Is doing okay    03/17/2023    2:18 PM 02/26/2023    1:31 PM 11/18/2022   11:45 AM  Depression screen PHQ 2/9  Decreased Interest 3 0 2  Down, Depressed, Hopeless 2 0 2  PHQ - 2 Score 5 0 4  Altered sleeping 3  3  Tired, decreased energy 3  2  Change in appetite 2  0  Feeling bad or failure about yourself  2  0  Trouble concentrating 3  2  Moving slowly or fidgety/restless 2  1  Suicidal thoughts 0  0  PHQ-9 Score 20  12  Difficult doing  work/chores Somewhat difficult  Somewhat difficult     8. GAD (generalized anxiety disorder) Has had ativan in the past. Takes at night.     03/17/2023    2:19 PM 11/18/2022   11:45 AM 08/30/2022   11:47 AM 02/26/2022   12:13 PM  GAD 7 : Generalized Anxiety Score  Nervous, Anxious, on Edge 3 2 2 1   Control/stop worrying 2 2 2 2   Worry too much - different things 3 2 2 2   Trouble relaxing 3 3 3 2   Restless 2 1 2 1   Easily annoyed or irritable 3 2 3 1   Afraid - awful might happen 2 2 2 1   Total GAD 7 Score 18 14 16 10   Anxiety Difficulty Somewhat difficult Somewhat difficult Somewhat difficult Not difficult at all      9. Late effect of cerebrovascular accident (CVA) No permanent effects. Is no longer on eliquis  10. Vitamin D deficiency Is on daily vitamin d suppelement  11. Hypokalemia No c/o muscle cramps Lab Results  Component Value Date   K 4.3 11/30/2022     12. Other osteoporosis without current pathological fracture Is no longer doing bone density tests  13. Overweight (BMI 25.0-29.9) Weight is down 19lbs Wt Readings from Last 3 Encounters:  03/17/23 180 lb (  81.6 kg)  02/26/23 199 lb (90.3 kg)  11/27/22 220 lb (99.8 kg)   BMI Readings from Last 3 Encounters:  03/17/23 25.83 kg/m  02/26/23 28.55 kg/m  11/27/22 31.57 kg/m      New complaints: Had another fall 2 weeks ago but dd not sustain any injury  Allergies  Allergen Reactions   Gabapentin Other (See Comments)    Confusion and somnolence    Outpatient Encounter Medications as of 03/17/2023  Medication Sig   Alcohol Swabs (B-D SINGLE USE SWABS REGULAR) PADS Test BS daily and as needed Dx E11.65   amLODipine-benazepril (LOTREL) 10-40 MG capsule Take 1 capsule by mouth daily.   apixaban (ELIQUIS) 2.5 MG TABS tablet Take 1 tablet (2.5 mg total) by mouth 2 (two) times daily for 26 days.   aspirin EC 81 MG tablet Take 1 tablet (81 mg total) by mouth daily.   azithromycin (ZITHROMAX Z-PAK) 250 MG  tablet As directed   baclofen (LIORESAL) 10 MG tablet Take 1 tablet (10 mg total) by mouth 3 (three) times daily.   Blood Glucose Calibration (TRUE METRIX LEVEL 1) Low SOLN Use with glucose monitor Dx E11.65   Blood Glucose Monitoring Suppl (TRUE METRIX AIR GLUCOSE METER) w/Device KIT Test BS daily and as needed Dx E11.65   docusate sodium (COLACE) 100 MG capsule Take 1 capsule (100 mg total) by mouth 2 (two) times daily.   fenofibrate (TRICOR) 145 MG tablet TAKE ONE (1) TABLET BY MOUTH EVERY DAY   glucose blood (TRUE METRIX BLOOD GLUCOSE TEST) test strip Test BS daily and as needed Dx E11.65   LORazepam (ATIVAN) 0.5 MG tablet Take 1 tablet (0.5 mg total) by mouth 2 (two) times daily as needed. for anxiety   metFORMIN (GLUCOPHAGE-XR) 500 MG 24 hr tablet TAKE 1 TABLET BY MOUTH EVERY MORNING & TAKE 1 TABLET BY MOUTH AT BEDTIME   nystatin (MYCOSTATIN/NYSTOP) powder APPLY TOPICALLY FOUR TIMES DAILY (Patient taking differently: Apply 1 Application topically 4 (four) times daily.)   ondansetron (ZOFRAN-ODT) 4 MG disintegrating tablet TAKE 1 TABLET EVERY 8 HOURS AS NEEDED FOR NAUSEA AND VOMITING   oxyCODONE (OXY IR/ROXICODONE) 5 MG immediate release tablet Take 1 tablet (5 mg total) by mouth every 4 (four) hours as needed for severe pain.   OXYGEN Inhale 2 L into the lungs continuous as needed.   polyethylene glycol (MIRALAX / GLYCOLAX) 17 g packet Take 17 g by mouth daily.   PREMARIN vaginal cream Apply 1 application topically daily as needed (irritation).    rosuvastatin (CRESTOR) 40 MG tablet TAKE ONE (1) TABLET EACH DAY   traMADol (ULTRAM) 50 MG tablet Take 1-2 tablets (50-100 mg total) by mouth every 6 (six) hours as needed for moderate pain or severe pain.   TRUEplus Lancets 33G MISC Test BS daily and as needed Dx E11.65   Vitamin D, Ergocalciferol, (DRISDOL) 1.25 MG (50000 UNIT) CAPS capsule Take 1 capsule (50,000 Units total) by mouth every 7 (seven) days.   No facility-administered encounter  medications on file as of 03/17/2023.    Past Surgical History:  Procedure Laterality Date   CATARACT EXTRACTION W/PHACO  06/06/2011   Procedure: CATARACT EXTRACTION PHACO AND INTRAOCULAR LENS PLACEMENT (IOC);  Surgeon: Gemma Payor;  Location: [facility];  Service: Ophthalmology;  Laterality: Right;  CDE=12.77   CATARACT EXTRACTION W/PHACO  06/27/2011   Procedure: CATARACT EXTRACTION PHACO AND INTRAOCULAR LENS PLACEMENT (IOC);  Surgeon: Gemma Payor;  Location: [facility];  Service: Ophthalmology;  Laterality: Left;  CDE:13.96  CYSTOSCOPY WITH URETEROSCOPY AND STENT PLACEMENT Right 04/27/2015   Procedure: CYSTOSCOPY WITH RIGHT URETEROSCOPY, BASKET REMOVAL OF STONE, REMOVAL OF RIGHT NEPHROSTOMY TUBE;  Surgeon: Bjorn Pippin, MD;  Location: [facility];  Service: Urology;  Laterality: Right;   CYSTOSCOPY/URETEROSCOPY/HOLMIUM LASER/STENT PLACEMENT Left 12/24/2016   Procedure: LEFT URETEROSCOPY WITH HOLMIUM LASER AND STENT PLACEMENT;  Surgeon: Bjorn Pippin, MD;  Location: [facility];  Service: Urology;  Laterality: Left;   EXTRACORPOREAL SHOCK WAVE LITHOTRIPSY  left 04-01-2016;  1980's   FRACTURE SURGERY     HIP ARTHROPLASTY     HOLMIUM LASER APPLICATION Right 04/27/2015   Procedure: HOLMIUM LASER APPLICATION;  Surgeon: Bjorn Pippin, MD;  Location: [facility];  Service: Urology;  Laterality: Right;   KNEE ARTHROPLASTY     KNEE ARTHROSCOPY Bilateral right 2005//  left ?   LOOP RECORDER INSERTION N/A 04/01/2017   Procedure: LOOP RECORDER INSERTION;  Surgeon: Hillis Range, MD;  Location: [facility];  Service: Cardiovascular;  Laterality: N/A;   ORIF FEMUR FRACTURE Right 11/27/2022   Procedure: OPEN REDUCTION INTERNAL FIXATION (ORIF) DISTAL FEMUR FRACTURE;  Surgeon: Roby Lofts, MD;  Location: [facility];  Service: Orthopedics;  Laterality: Right;   TOTAL HIP ARTHROPLASTY  10/28/2011   Procedure: TOTAL HIP ARTHROPLASTY;  Surgeon: Loanne Drilling, MD;  Location: [facility];  Service: Orthopedics;  Laterality: Right;   TOTAL KNEE ARTHROPLASTY Bilateral left 03-09-2007//  right 2006   TRANSTHORACIC ECHOCARDIOGRAM  10/06/2006   normal echo,  ef 55-60%   TUBAL LIGATION      Family History  Problem Relation Age of Onset   Kidney disease Mother    Diabetes Mother    Congestive Heart Failure Father    Heart failure Father    Heart disease Brother    Alcohol abuse Brother    Anesthesia problems Neg Hx    Hypotension Neg Hx    Malignant hyperthermia Neg Hx    Pseudochol deficiency Neg Hx       Controlled substance contract: 09/03/22     Review of Systems  Constitutional:  Negative for diaphoresis.  Eyes:  Negative for pain.  Respiratory:  Negative for shortness of breath.   Cardiovascular:  Negative for chest pain, palpitations and leg swelling.  Gastrointestinal:  Negative for abdominal pain.  Endocrine: Negative for polydipsia.  Skin:  Negative for rash.  Neurological:  Negative for dizziness, weakness and headaches.  Hematological:  Does not bruise/bleed easily.  All other systems reviewed and are negative.      Objective:   Physical Exam Vitals and nursing note reviewed.  Constitutional:      General: She is not in acute distress.    Appearance: Normal appearance. She is well-developed.  HENT:     Head: Normocephalic.     Right Ear: Tympanic membrane normal.     Left Ear: Tympanic membrane normal.     Nose: Nose normal.     Mouth/Throat:     Mouth: Mucous membranes are moist.  Eyes:     Pupils: Pupils are equal, round, and reactive to light.  Neck:     Vascular: No carotid bruit or JVD.  Cardiovascular:     Rate and Rhythm: Normal rate and regular rhythm.     Heart sounds: Normal heart sounds.  Pulmonary:     Effort: Pulmonary effort is normal. No respiratory distress.     Breath sounds: Normal breath sounds. No wheezing or rales.  Chest:  Chest wall: No tenderness.  Abdominal:     General: Bowel sounds are normal.  There is no distension or abdominal bruit.     Palpations: Abdomen is soft. There is no hepatomegaly, splenomegaly, mass or pulsatile mass.     Tenderness: There is no abdominal tenderness.  Musculoskeletal:        General: Normal range of motion.     Cervical back: Normal range of motion and neck supple.  Lymphadenopathy:     Cervical: No cervical adenopathy.  Skin:    General: Skin is warm and dry.  Neurological:     Mental Status: She is alert and oriented to person, place, and time.     Deep Tendon Reflexes: Reflexes are normal and symmetric.  Psychiatric:        Behavior: Behavior normal.        Thought Content: Thought content normal.        Judgment: Judgment normal.     BP (!) 142/72   Pulse 94   Temp 97.8 F (36.6 C) (Temporal)   Resp 20   Ht 5\' 10"  (1.778 m)   Wt 180 lb (81.6 kg)   SpO2 96%   BMI 25.83 kg/m   HGBA1c 5.7%     Assessment & Plan:   Caitlin Mccarthy comes in today with chief complaint of Medical Management of Chronic Issues   Diagnosis and orders addressed:  1. Essential hypertension, benign Low sodium diet - CBC with Differential/Platelet - CMP14+EGFR  2. Chronic respiratory failure with hypoxia (HCC) 3. Simple chronic bronchitis (HCC) Report any respiratory issues  4. Gastroesophageal reflux disease without esophagitis Avoid spicy foods Do not eat 2 hours prior to bedtime   5. Hyperlipidemia due to type 2 diabetes mellitus (HCC) Low fat diet - Lipid panel  6. Diabetes mellitus treated with oral medication (HCC) Continue to watch carbs in diet - Bayer DCA Hb A1c Waived - Microalbumin / creatinine urine ratio  7. Episode of recurrent major depressive disorder, unspecified depression episode severity (HCC) Stress management Will start  on celexa - citalopram (CELEXA) 20 MG tablet; Take 1 tablet (20 mg total) by mouth daily.  Dispense: 30 tablet; Refill: 5  8. GAD (generalized anxiety disorder) Continue ativan at night  9.  Late effect of cerebrovascular accident (CVA)  10. Vitamin D deficiency Daily vitamin d supplement  11. Hypokalemia Labs pending  12. Other osteoporosis without current pathological fracture Fall prevention  13. Overweight (BMI 25.0-29.9) Discussed diet and exercise for person with BMI >25 Will recheck weight in 3-6 months    Labs pending Health Maintenance reviewed Diet and exercise encouraged  Follow up plan: 6 months   Mary-Margaret Daphine Deutscher, FNP

## 2023-03-18 LAB — CBC WITH DIFFERENTIAL/PLATELET
Basophils Absolute: 0.1 10*3/uL (ref 0.0–0.2)
Basos: 1 %
EOS (ABSOLUTE): 0.3 10*3/uL (ref 0.0–0.4)
Eos: 3 %
Hematocrit: 42.3 % (ref 34.0–46.6)
Hemoglobin: 13.4 g/dL (ref 11.1–15.9)
Immature Grans (Abs): 0 10*3/uL (ref 0.0–0.1)
Immature Granulocytes: 0 %
Lymphocytes Absolute: 3 10*3/uL (ref 0.7–3.1)
Lymphs: 38 %
MCH: 29.5 pg (ref 26.6–33.0)
MCHC: 31.7 g/dL (ref 31.5–35.7)
MCV: 93 fL (ref 79–97)
Monocytes Absolute: 0.8 10*3/uL (ref 0.1–0.9)
Monocytes: 10 %
Neutrophils Absolute: 3.8 10*3/uL (ref 1.4–7.0)
Neutrophils: 48 %
Platelets: 345 10*3/uL (ref 150–450)
RBC: 4.54 x10E6/uL (ref 3.77–5.28)
RDW: 11.9 % (ref 11.7–15.4)
WBC: 8 10*3/uL (ref 3.4–10.8)

## 2023-03-18 LAB — CMP14+EGFR
ALT: 8 IU/L (ref 0–32)
AST: 16 IU/L (ref 0–40)
Albumin: 3.9 g/dL (ref 3.7–4.7)
Alkaline Phosphatase: 79 IU/L (ref 44–121)
BUN/Creatinine Ratio: 24 (ref 12–28)
BUN: 22 mg/dL (ref 8–27)
Bilirubin Total: 0.3 mg/dL (ref 0.0–1.2)
CO2: 25 mmol/L (ref 20–29)
Calcium: 11.8 mg/dL — ABNORMAL HIGH (ref 8.7–10.3)
Chloride: 102 mmol/L (ref 96–106)
Creatinine, Ser: 0.9 mg/dL (ref 0.57–1.00)
Globulin, Total: 3.2 g/dL (ref 1.5–4.5)
Glucose: 97 mg/dL (ref 70–99)
Potassium: 4 mmol/L (ref 3.5–5.2)
Sodium: 142 mmol/L (ref 134–144)
Total Protein: 7.1 g/dL (ref 6.0–8.5)
eGFR: 63 mL/min/{1.73_m2} (ref >59)

## 2023-03-18 LAB — LIPID PANEL
Chol/HDL Ratio: 4.1 ratio (ref 0.0–4.4)
Cholesterol, Total: 138 mg/dL (ref 100–199)
HDL: 34 mg/dL — ABNORMAL LOW (ref >39)
LDL Chol Calc (NIH): 68 mg/dL (ref 0–99)
Triglycerides: 220 mg/dL — ABNORMAL HIGH (ref 0–149)
VLDL Cholesterol Cal: 36 mg/dL (ref 5–40)

## 2023-03-18 LAB — MICROALBUMIN / CREATININE URINE RATIO
Creatinine, Urine: 180.9 mg/dL
Microalb/Creat Ratio: 180 mg/g{creat} — ABNORMAL HIGH (ref 0–29)
Microalbumin, Urine: 325.4 ug/mL

## 2023-03-21 ENCOUNTER — Other Ambulatory Visit: Payer: Self-pay | Admitting: Nurse Practitioner

## 2023-03-21 NOTE — Telephone Encounter (Signed)
Pt is checking on Tramodal refill. She said she had a visit with mmm last week and this rx was not sent in

## 2023-03-24 DIAGNOSIS — D62 Acute posthemorrhagic anemia: Secondary | ICD-10-CM | POA: Diagnosis not present

## 2023-03-24 DIAGNOSIS — J9611 Chronic respiratory failure with hypoxia: Secondary | ICD-10-CM | POA: Diagnosis not present

## 2023-03-24 DIAGNOSIS — J449 Chronic obstructive pulmonary disease, unspecified: Secondary | ICD-10-CM | POA: Diagnosis not present

## 2023-03-24 DIAGNOSIS — I10 Essential (primary) hypertension: Secondary | ICD-10-CM | POA: Diagnosis not present

## 2023-03-24 DIAGNOSIS — S72451D Displaced supracondylar fracture without intracondylar extension of lower end of right femur, subsequent encounter for closed fracture with routine healing: Secondary | ICD-10-CM | POA: Diagnosis not present

## 2023-03-24 DIAGNOSIS — E1165 Type 2 diabetes mellitus with hyperglycemia: Secondary | ICD-10-CM | POA: Diagnosis not present

## 2023-03-24 DIAGNOSIS — E1169 Type 2 diabetes mellitus with other specified complication: Secondary | ICD-10-CM | POA: Diagnosis not present

## 2023-03-24 DIAGNOSIS — F411 Generalized anxiety disorder: Secondary | ICD-10-CM | POA: Diagnosis not present

## 2023-03-24 DIAGNOSIS — E785 Hyperlipidemia, unspecified: Secondary | ICD-10-CM | POA: Diagnosis not present

## 2023-04-04 ENCOUNTER — Telehealth: Payer: Self-pay | Admitting: Nurse Practitioner

## 2023-04-04 ENCOUNTER — Other Ambulatory Visit: Payer: Self-pay

## 2023-04-04 ENCOUNTER — Other Ambulatory Visit: Payer: Medicare HMO

## 2023-04-04 DIAGNOSIS — R300 Dysuria: Secondary | ICD-10-CM

## 2023-04-04 LAB — URINALYSIS, COMPLETE
Bilirubin, UA: NEGATIVE
Glucose, UA: NEGATIVE
Ketones, UA: NEGATIVE
Nitrite, UA: NEGATIVE
Specific Gravity, UA: 1.025 (ref 1.005–1.030)
Urobilinogen, Ur: 0.2 mg/dL (ref 0.2–1.0)
pH, UA: 7 (ref 5.0–7.5)

## 2023-04-04 LAB — MICROSCOPIC EXAMINATION
Renal Epithel, UA: NONE SEEN /[HPF]
Yeast, UA: NONE SEEN

## 2023-04-04 NOTE — Telephone Encounter (Signed)
[place] to bring in urine

## 2023-04-04 NOTE — Telephone Encounter (Signed)
Ok to bring in urine sample. Patients daughter notified

## 2023-04-05 LAB — URINE CULTURE

## 2023-04-07 ENCOUNTER — Emergency Department (HOSPITAL_COMMUNITY): Payer: Medicare HMO

## 2023-04-07 ENCOUNTER — Other Ambulatory Visit: Payer: Self-pay

## 2023-04-07 ENCOUNTER — Emergency Department (HOSPITAL_COMMUNITY)
Admission: EM | Admit: 2023-04-07 | Discharge: 2023-04-07 | Disposition: A | Discharge status: Home / Self Care | Payer: Medicare HMO | Attending: Emergency Medicine | Admitting: Emergency Medicine

## 2023-04-07 ENCOUNTER — Encounter (HOSPITAL_COMMUNITY): Payer: Self-pay | Admitting: *Deleted

## 2023-04-07 DIAGNOSIS — I10 Essential (primary) hypertension: Secondary | ICD-10-CM | POA: Diagnosis not present

## 2023-04-07 DIAGNOSIS — N3000 Acute cystitis without hematuria: Secondary | ICD-10-CM | POA: Diagnosis not present

## 2023-04-07 DIAGNOSIS — E119 Type 2 diabetes mellitus without complications: Secondary | ICD-10-CM | POA: Insufficient documentation

## 2023-04-07 DIAGNOSIS — Z7982 Long term (current) use of aspirin: Secondary | ICD-10-CM | POA: Insufficient documentation

## 2023-04-07 DIAGNOSIS — R410 Disorientation, unspecified: Secondary | ICD-10-CM | POA: Insufficient documentation

## 2023-04-07 DIAGNOSIS — Z79899 Other long term (current) drug therapy: Secondary | ICD-10-CM | POA: Diagnosis not present

## 2023-04-07 DIAGNOSIS — R911 Solitary pulmonary nodule: Secondary | ICD-10-CM | POA: Insufficient documentation

## 2023-04-07 DIAGNOSIS — I700 Atherosclerosis of aorta: Secondary | ICD-10-CM | POA: Diagnosis not present

## 2023-04-07 DIAGNOSIS — Z7984 Long term (current) use of oral hypoglycemic drugs: Secondary | ICD-10-CM | POA: Diagnosis not present

## 2023-04-07 DIAGNOSIS — J984 Other disorders of lung: Secondary | ICD-10-CM | POA: Diagnosis not present

## 2023-04-07 DIAGNOSIS — R4182 Altered mental status, unspecified: Secondary | ICD-10-CM | POA: Diagnosis not present

## 2023-04-07 DIAGNOSIS — Z8673 Personal history of transient ischemic attack (TIA), and cerebral infarction without residual deficits: Secondary | ICD-10-CM | POA: Diagnosis not present

## 2023-04-07 LAB — CBC WITH DIFFERENTIAL/PLATELET
Abs Immature Granulocytes: 0.01 10*3/uL (ref 0.00–0.07)
Basophils Absolute: 0 10*3/uL (ref 0.0–0.1)
Basophils Relative: 1 %
Eosinophils Absolute: 0.2 10*3/uL (ref 0.0–0.5)
Eosinophils Relative: 3 %
HCT: 39.5 % (ref 36.0–46.0)
Hemoglobin: 12.6 g/dL (ref 12.0–15.0)
Immature Granulocytes: 0 %
Lymphocytes Relative: 45 %
Lymphs Abs: 2.5 10*3/uL (ref 0.7–4.0)
MCH: 29.9 pg (ref 26.0–34.0)
MCHC: 31.9 g/dL (ref 30.0–36.0)
MCV: 93.6 fL (ref 80.0–100.0)
Monocytes Absolute: 0.5 10*3/uL (ref 0.1–1.0)
Monocytes Relative: 10 %
Neutro Abs: 2.2 10*3/uL (ref 1.7–7.7)
Neutrophils Relative %: 41 %
Platelets: 239 10*3/uL (ref 150–400)
RBC: 4.22 MIL/uL (ref 3.87–5.11)
RDW: 13.4 % (ref 11.5–15.5)
WBC: 5.4 10*3/uL (ref 4.0–10.5)
nRBC: 0 % (ref 0.0–0.2)

## 2023-04-07 LAB — URINALYSIS, ROUTINE W REFLEX MICROSCOPIC
Bilirubin Urine: NEGATIVE
Glucose, UA: NEGATIVE mg/dL
Ketones, ur: NEGATIVE mg/dL
Nitrite: NEGATIVE
Protein, ur: >=300 mg/dL — AB
Specific Gravity, Urine: 1.014 (ref 1.005–1.030)
pH: 5 (ref 5.0–8.0)

## 2023-04-07 LAB — HEPATIC FUNCTION PANEL
ALT: 17 U/L (ref 0–44)
AST: 31 U/L (ref 15–41)
Albumin: 3.6 g/dL (ref 3.5–5.0)
Alkaline Phosphatase: 55 U/L (ref 38–126)
Bilirubin, Direct: 0.1 mg/dL (ref 0.0–0.2)
Indirect Bilirubin: 0.5 mg/dL (ref 0.3–0.9)
Total Bilirubin: 0.6 mg/dL (ref 0.3–1.2)
Total Protein: 7.3 g/dL (ref 6.5–8.1)

## 2023-04-07 LAB — BASIC METABOLIC PANEL
Anion gap: 7 (ref 5–15)
BUN: 12 mg/dL (ref 8–23)
CO2: 28 mmol/L (ref 22–32)
Calcium: 10.3 mg/dL (ref 8.9–10.3)
Chloride: 101 mmol/L (ref 98–111)
Creatinine, Ser: 0.63 mg/dL (ref 0.44–1.00)
GFR, Estimated: >60 mL/min (ref >60)
Glucose, Bld: 106 mg/dL — ABNORMAL HIGH (ref 70–99)
Potassium: 4 mmol/L (ref 3.5–5.1)
Sodium: 136 mmol/L (ref 135–145)

## 2023-04-07 LAB — CBG MONITORING, ED: Glucose-Capillary: 147 mg/dL — ABNORMAL HIGH (ref 70–99)

## 2023-04-07 LAB — AMMONIA: Ammonia: 24 umol/L (ref 9–35)

## 2023-04-07 MED ORDER — PANTOPRAZOLE SODIUM 40 MG PO TBEC: 40.0000 mg | DELAYED_RELEASE_TABLET | Freq: Every day | ORAL | 0 refills | Status: DC

## 2023-04-07 MED ORDER — CEPHALEXIN 500 MG PO CAPS
500.0000 mg | ORAL_CAPSULE | Freq: Once | ORAL | Status: AC
  Administered 2023-04-07: 500 mg via ORAL
  Filled 2023-04-07: qty 1

## 2023-04-07 MED ORDER — CEPHALEXIN 500 MG PO CAPS: 500.0000 mg | ORAL_CAPSULE | Freq: Two times a day (BID) | ORAL | 0 refills | Status: DC

## 2023-04-07 MED ORDER — SODIUM CHLORIDE 0.9 % IV SOLN
1.0000 g | Freq: Once | INTRAVENOUS | Status: DC
  Filled 2023-04-07: qty 10

## 2023-04-07 NOTE — ED Provider Notes (Signed)
EMERGENCY DEPARTMENT AT [facility] Provider Note   CSN: [id number] Arrival date & time: 04/07/23  1225     History  Chief Complaint  Patient presents with   Hypertension   Altered Mental Status    Caitlin Mccarthy is a 87 y.o. female, hx of HTN, CVA, DMII, who presents to the ED 2/2 to AMS.  Pt complains of feeling confused, and off balance for the last 3 days.  Per family at bedside, patient has just been been not acting herself, has been more confused, and does not know where she is, who she is, states this is an acute change, has happened over the last couple days, as she is alert and oriented x 3 baseline.  Denies any chest pain, shortness of breath, nausea, vomiting, abdominal pain, weakness in one-sided the body or urinary symptoms.  Was checked by her doctor a couple days ago and tested negative for urinary tract infection.  Per family at bedside, she has had intermittent periods of confusion over the last month or so, but typically resolves after couple hours, but this has been more pretty persistent so that is why they brought her in.  Home Medications Prior to Admission medications   Medication Sig Start Date End Date Taking? Authorizing Provider  cephALEXin (KEFLEX) 500 MG capsule Take 1 capsule (500 mg total) by mouth 2 (two) times daily. 04/07/23  Yes Yahshua Thibault L, PA  pantoprazole (PROTONIX) 40 MG tablet Take 1 tablet (40 mg total) by mouth daily. 04/07/23  Yes Breslyn Abdo L, PA  Alcohol Swabs (B-D SINGLE USE SWABS REGULAR) PADS Test BS daily and as needed Dx E11.65 01/05/20   Delynn Flavin M, DO  amLODipine-benazepril (LOTREL) 10-40 MG capsule Take 1 capsule by mouth daily. 03/17/23   Daphine Deutscher Mary-Margaret, FNP  aspirin EC 81 MG tablet Take 1 tablet (81 mg total) by mouth daily. 12/29/22   Rolly Salter, MD  baclofen (LIORESAL) 10 MG tablet Take 1 tablet (10 mg total) by mouth 3 (three) times daily. 12/17/22   Bennie Pierini, FNP  Blood  Glucose Calibration (TRUE METRIX LEVEL 1) Low SOLN Use with glucose monitor Dx E11.65 01/05/20   Delynn Flavin M, DO  Blood Glucose Monitoring Suppl (TRUE METRIX AIR GLUCOSE METER) w/Device KIT Test BS daily and as needed Dx E11.65 01/05/20   Raliegh Ip, DO  citalopram (CELEXA) 20 MG tablet Take 1 tablet (20 mg total) by mouth daily. 03/17/23   Daphine Deutscher, Mary-Margaret, FNP  docusate sodium (COLACE) 100 MG capsule Take 1 capsule (100 mg total) by mouth 2 (two) times daily. 11/29/22   Rolly Salter, MD  erythromycin ophthalmic ointment Place 1 Application into the left eye 3 (three) times daily. 03/17/23   Daphine Deutscher, Mary-Margaret, FNP  fenofibrate (TRICOR) 145 MG tablet Take 1 tablet (145 mg total) by mouth daily. 03/17/23   Daphine Deutscher, Mary-Margaret, FNP  glucose blood (TRUE METRIX BLOOD GLUCOSE TEST) test strip Test BS daily and as needed Dx E11.65 01/05/20   Delynn Flavin M, DO  LORazepam (ATIVAN) 0.5 MG tablet Take 1 tablet (0.5 mg total) by mouth at bedtime. for anxiety 03/17/23   Bennie Pierini, FNP  metFORMIN (GLUCOPHAGE-XR) 500 MG 24 hr tablet Take 1 tablet (500 mg total) by mouth 2 (two) times daily with a meal. 03/17/23   Daphine Deutscher, Mary-Margaret, FNP  nystatin (MYCOSTATIN/NYSTOP) powder APPLY TOPICALLY FOUR TIMES DAILY Patient taking differently: Apply 1 Application topically 4 (four) times daily. 05/13/22   Daphine Deutscher, Mary-Margaret,  FNP  ondansetron (ZOFRAN-ODT) 4 MG disintegrating tablet TAKE 1 TABLET EVERY 8 HOURS AS NEEDED FOR NAUSEA AND VOMITING 08/13/21   Daphine Deutscher, Mary-Margaret, FNP  oxyCODONE (OXY IR/ROXICODONE) 5 MG immediate release tablet Take 1 tablet (5 mg total) by mouth every 4 (four) hours as needed for severe pain. 12/03/22   Rolly Salter, MD  OXYGEN Inhale 2 L into the lungs continuous as needed.    [provider]  polyethylene glycol (MIRALAX / GLYCOLAX) 17 g packet Take 17 g by mouth daily.    [provider]  PREMARIN vaginal cream Apply 1 application  topically daily as needed (irritation).  12/06/16   [provider]  rosuvastatin (CRESTOR) 40 MG tablet TAKE ONE (1) TABLET EACH DAY 03/17/23   Daphine Deutscher, Mary-Margaret, FNP  traMADol (ULTRAM) 50 MG tablet Take 1 tablet (50 mg total) by mouth 2 (two) times daily. 03/21/23   Bennie Pierini, FNP  TRUEplus Lancets 33G MISC Test BS daily and as needed Dx E11.65 01/05/20   Raliegh Ip, DO  Vitamin D, Ergocalciferol, (DRISDOL) 1.25 MG (50000 UNIT) CAPS capsule Take 1 capsule (50,000 Units total) by mouth every 7 (seven) days. 12/05/22   Rolly Salter, MD      Allergies    Gabapentin    Review of Systems   Review of Systems  Respiratory:  Negative for shortness of breath.   Cardiovascular:  Negative for chest pain.    Physical Exam Updated Vital Signs BP (!) 188/67 (BP Location: Right Arm)   Pulse 75   Temp 97.8 F (36.6 C) (Oral)   Resp 20   Ht 5\' 10"  (1.778 m)   Wt 83.9 kg   SpO2 95%   BMI 26.54 kg/m  Physical Exam Vitals and nursing note reviewed.  Constitutional:      General: She is not in acute distress.    Appearance: She is well-developed.  HENT:     Head: Normocephalic and atraumatic.  Eyes:     Conjunctiva/sclera: Conjunctivae normal.  Cardiovascular:     Rate and Rhythm: Normal rate and regular rhythm.     Heart sounds: No murmur heard. Pulmonary:     Effort: Pulmonary effort is normal. No respiratory distress.     Breath sounds: Normal breath sounds.  Abdominal:     Palpations: Abdomen is soft.     Tenderness: There is no abdominal tenderness.  Musculoskeletal:        General: No swelling.     Cervical back: Neck supple.  Skin:    General: Skin is warm and dry.     Capillary Refill: Capillary refill takes less than 2 seconds.  Neurological:     Mental Status: She is alert.     Cranial Nerves: Cranial nerves 2-12 are intact.     Sensory: Sensation is intact.     Motor: Motor function is intact.     Comments: AXO2  Psychiatric:        Mood  and Affect: Mood normal.     ED Results / Procedures / Treatments   Labs (all labs ordered are listed, but only abnormal results are displayed) Labs Reviewed  BASIC METABOLIC PANEL - Abnormal; Notable for the following components:      Result Value   Glucose, Bld 106 (*)    All other components within normal limits  URINALYSIS, ROUTINE W REFLEX MICROSCOPIC - Abnormal; Notable for the following components:   APPearance HAZY (*)    Hgb urine dipstick Blanchie Zeleznik (*)  Protein, ur >=300 (*)    Leukocytes,Ua TRACE (*)    Bacteria, UA RARE (*)    All other components within normal limits  CBG MONITORING, ED - Abnormal; Notable for the following components:   Glucose-Capillary 147 (*)    All other components within normal limits  URINE CULTURE  CBC WITH DIFFERENTIAL/PLATELET  HEPATIC FUNCTION PANEL  AMMONIA    EKG EKG Interpretation Date/Time:  Monday April 07 2023 13:01:54 EDT Ventricular Rate:  67 PR Interval:  156 QRS Duration:  84 QT Interval:  404 QTC Calculation: 426 R Axis:   71  Text Interpretation: Normal sinus rhythm Nonspecific ST abnormality  Similar to prior EKGs Confirmed by Vivi Barrack [phone]) on 04/07/2023 2:04:13 PM  Radiology MR BRAIN WO CONTRAST  Result Date: 04/07/2023 CLINICAL DATA:  Mental status change, unknown cause. EXAM: MRI HEAD WITHOUT CONTRAST TECHNIQUE: Multiplanar, multiecho pulse sequences of the brain and surrounding structures were obtained without intravenous contrast. COMPARISON:  MRI brain 01/09/2017.  Head CT 04/07/2023. FINDINGS: Brain: No acute infarct or hemorrhage. Unchanged moderate chronic Paislea Hatton-vessel disease. No mass or midline shift. No hydrocephalus or extra-axial collection. No abnormal susceptibility. Vascular: Normal flow voids. Skull and upper cervical spine: Normal marrow signal. Sinuses/Orbits: No acute findings. Other: None. IMPRESSION: 1. No acute intracranial abnormality or mass. 2. Unchanged moderate chronic Rheda Kassab-vessel  disease. Electronically Signed   By: Orvan Falconer M.D.   On: 04/07/2023 18:49   CT Head Wo Contrast  Result Date: 04/07/2023 CLINICAL DATA:  Mental status change. EXAM: CT HEAD WITHOUT CONTRAST TECHNIQUE: Contiguous axial images were obtained from the base of the skull through the vertex without intravenous contrast. RADIATION DOSE REDUCTION: This exam was performed according to the departmental dose-optimization program which includes automated exposure control, adjustment of the mA and/or kV according to patient size and/or use of iterative reconstruction technique. COMPARISON:  01/09/2017 FINDINGS: Brain: No evidence of acute infarction, hemorrhage, hydrocephalus, extra-axial collection or mass lesion/mass effect. There is moderate low-attenuation within the subcortical and periventricular white matter compatible with chronic microvascular disease. Prominence of the sulci and ventricles compatible with brain atrophy. Vascular: No hyperdense vessel or unexpected calcification. Skull: Normal. Negative for fracture or focal lesion. Sinuses/Orbits: No acute finding. Other: None. IMPRESSION: 1. No acute intracranial abnormalities. 2. Chronic microvascular disease and brain atrophy. Electronically Signed   By: Signa Kell M.D.   On: 04/07/2023 15:56   DG Chest 2 View  Result Date: 04/07/2023 CLINICAL DATA:  Altered mental status. Confusion. Off balance for 3 days. EXAM: CHEST - 2 VIEW COMPARISON:  11/26/2022 FINDINGS: Heart size and mediastinal contours are stable. Loop recorder is identified in the projection of the left side of heart. Aortic atherosclerotic calcifications. No pleural fluid or interstitial edema. No acute airspace opacity. Faint nodular density within the right midlung appears new from the prior exam measuring 1.6 cm. The visualized osseous structures appear unremarkable. IMPRESSION: 1. No acute cardiopulmonary abnormalities. 2. Faint nodular density within the right midlung appears new  from the prior exam measuring 1.6 cm. Recommend further evaluation with non emergent CT of the chest. 3.  Aortic Atherosclerosis (ICD10-I70.0). Electronically Signed   By: Signa Kell M.D.   On: 04/07/2023 15:32    Procedures Procedures    Medications Ordered in ED Medications  cephALEXin (KEFLEX) capsule 500 mg (has no administration in time range)    ED Course/ Medical Decision Making/ A&P  Medical Decision Making Patient is 87 year old female, here brought in for AMS, by her family.  She has been a little bit confused intermittently for the last month, but more confused persistently for the last 2 days.  They state that she has not been staying up all night, and has been acting okay, but it waxes and wanes.  Are concerned given the confusion has been persistent for the last couple days.  She has no neurodeficits on my exam, however will obtain a head CT, MRI to rule out a CVA, blood work and urinalysis for further evaluation as well as chest x-ray.  She has no infectious symptoms, at this time.  Amount and/or Complexity of Data Reviewed Labs: ordered.    Details: Trace bacteria in the urine, labs otherwise unremarkable Radiology: ordered.    Details: CT head, MRI showed no evidence of acute CVA, chest x-ray is unremarkable except for a nodule, in the chest Discussion of management or test interpretation with external provider(s): Discussed with Dr. Durwin Nora, he states to go ahead and treat for urinary tract infection, given few bacteria in urine, and confusion.  He states this may represent delirium.  I discussed with the patient's, family, concern for delirium and we discussed admission, they declined at this time, but would like the urinary tract infection treated.  I sent her home on Keflex, and follow-up with PCP.  Her symptoms may also represent onset of dementia, I recommend that they follow-up with her primary care doctor, and a possible  neuropsychologist, for further evaluation.  She was alert and oriented, on my second exam, and on discharge, she knew who she was, where she was and what year it was.  We discussed return precautions and she was discharged home.  Family was also requesting something for acid reflux, I started her on pantoprazole.  Risk Prescription drug management.   Final Clinical Impression(s) / ED Diagnoses Final diagnoses:  Confusion  Acute cystitis without hematuria  Lung nodule    Rx / DC Orders ED Discharge Orders          Ordered    pantoprazole (PROTONIX) 40 MG tablet  Daily        04/07/23 1911    cephALEXin (KEFLEX) 500 MG capsule  2 times daily        04/07/23 1911              Damion Kant, Harley Alto, PA 04/07/23 1914    Gloris Manchester, MD 04/07/23 [phone]

## 2023-04-07 NOTE — ED Notes (Signed)
Discharge instructions reviewed with pt and daughter.   Newly prescribed medications discussed and pharmacy verified. New medication recommendations reviewed for follow up with PCP.   Opportunity for questions and concerns provided.

## 2023-04-07 NOTE — ED Provider Triage Note (Signed)
Emergency Medicine Provider Triage Evaluation Note  DALARY HOLLAR , a 87 y.o. female  was evaluated in triage.  Pt complains of feeling confused, and off balance for the last 3 days.  Per family at bedside, patient has just been been not acting herself, has been more confused, and does not know where she is, who she is, states this is an acute change, has happened over the last couple days, as she is alert and oriented x 3 baseline.  Denies any chest pain, shortness of breath, nausea, vomiting, abdominal pain, weakness in one-sided the body or urinary symptoms.  Was checked by her doctor a couple days ago and was tested negative for urinary tract infection..  Review of Systems  Positive: confusion Negative: Abdominal pain, SOB  Physical Exam  BP (!) 188/67 (BP Location: Right Arm)   Pulse 75   Temp 97.8 F (36.6 C) (Oral)   Resp 20   Ht 5\' 10"  (1.778 m)   Wt 83.9 kg   SpO2 95%   BMI 26.54 kg/m  Gen:   Awake, no distress   Resp:  Normal effort  MSK:   Moves extremities without difficulty  Other:  Confused, hard of hearing, no focal deficits  Medical Decision Making  Medically screening exam initiated at 2:00 PM.  Appropriate orders placed.  EVIE CROSTON was informed that the remainder of the evaluation will be completed by another provider, this initial triage assessment does not replace that evaluation, and the importance of remaining in the ED until their evaluation is complete.     Pete Pelt, [place] 04/07/23 1401

## 2023-04-07 NOTE — ED Triage Notes (Addendum)
Pt with elevated BP at home. Family member states pt has been confused and disoriented for past 2 days off and on.  Generalized weakness, pt c/o bilateral knee pain with ambulating.  Pt able to state correct month but not year. Denies any one sided weakness or numbness.

## 2023-04-07 NOTE — Discharge Instructions (Addendum)
Follow-up with your primary care doctor, there was an incidental lung nodule found, you need a CT outpatient for further evaluation.  Additionally I think that your symptoms are likely secondary to possible new onset dementia, please follow-up with your primary care doctor, for further evaluation, you may and may need to be referred to neuropsychology outpatient as well, for testing.  You may start on donepezil or memantine for these issues however I recommend that you follow-up with your PCP.  We are prophylactically treating you for possible UTI, take the antibiotics as prescribed.

## 2023-04-08 ENCOUNTER — Other Ambulatory Visit: Payer: Self-pay | Admitting: Nurse Practitioner

## 2023-04-08 DIAGNOSIS — E7220 Disorder of urea cycle metabolism, unspecified: Secondary | ICD-10-CM

## 2023-04-08 MED ORDER — LACTULOSE 20 GM/30ML PO SOLN: 30.0000 mL | Freq: Four times a day (QID) | ORAL | 0 refills | Status: AC

## 2023-04-08 NOTE — Progress Notes (Unsigned)
Meds ordered this encounter  Medications   Lactulose 20 GM/30ML SOLN    Sig: Take 30 mLs (20 g total) by mouth 4 (four) times daily for 3 days.    Dispense:  450 mL    Refill:  0    Order Specific Question:   Supervising Provider    Answer:   Nils Pyle [[id number]]   Mary-Margaret Daphine Deutscher, FNP

## 2023-04-09 DIAGNOSIS — F411 Generalized anxiety disorder: Secondary | ICD-10-CM | POA: Diagnosis not present

## 2023-04-09 DIAGNOSIS — E1165 Type 2 diabetes mellitus with hyperglycemia: Secondary | ICD-10-CM | POA: Diagnosis not present

## 2023-04-09 DIAGNOSIS — J449 Chronic obstructive pulmonary disease, unspecified: Secondary | ICD-10-CM | POA: Diagnosis not present

## 2023-04-09 DIAGNOSIS — S72451D Displaced supracondylar fracture without intracondylar extension of lower end of right femur, subsequent encounter for closed fracture with routine healing: Secondary | ICD-10-CM | POA: Diagnosis not present

## 2023-04-09 DIAGNOSIS — J9611 Chronic respiratory failure with hypoxia: Secondary | ICD-10-CM | POA: Diagnosis not present

## 2023-04-09 DIAGNOSIS — E1169 Type 2 diabetes mellitus with other specified complication: Secondary | ICD-10-CM | POA: Diagnosis not present

## 2023-04-09 DIAGNOSIS — I10 Essential (primary) hypertension: Secondary | ICD-10-CM | POA: Diagnosis not present

## 2023-04-09 DIAGNOSIS — D62 Acute posthemorrhagic anemia: Secondary | ICD-10-CM | POA: Diagnosis not present

## 2023-04-09 DIAGNOSIS — E785 Hyperlipidemia, unspecified: Secondary | ICD-10-CM | POA: Diagnosis not present

## 2023-04-09 LAB — URINE CULTURE

## 2023-04-11 ENCOUNTER — Other Ambulatory Visit: Payer: Medicare HMO

## 2023-04-11 DIAGNOSIS — E1165 Type 2 diabetes mellitus with hyperglycemia: Secondary | ICD-10-CM | POA: Diagnosis not present

## 2023-04-11 DIAGNOSIS — F411 Generalized anxiety disorder: Secondary | ICD-10-CM | POA: Diagnosis not present

## 2023-04-11 DIAGNOSIS — E1169 Type 2 diabetes mellitus with other specified complication: Secondary | ICD-10-CM | POA: Diagnosis not present

## 2023-04-11 DIAGNOSIS — E785 Hyperlipidemia, unspecified: Secondary | ICD-10-CM | POA: Diagnosis not present

## 2023-04-11 DIAGNOSIS — I10 Essential (primary) hypertension: Secondary | ICD-10-CM | POA: Diagnosis not present

## 2023-04-11 DIAGNOSIS — J449 Chronic obstructive pulmonary disease, unspecified: Secondary | ICD-10-CM | POA: Diagnosis not present

## 2023-04-11 DIAGNOSIS — S72451D Displaced supracondylar fracture without intracondylar extension of lower end of right femur, subsequent encounter for closed fracture with routine healing: Secondary | ICD-10-CM | POA: Diagnosis not present

## 2023-04-11 DIAGNOSIS — D62 Acute posthemorrhagic anemia: Secondary | ICD-10-CM | POA: Diagnosis not present

## 2023-04-11 DIAGNOSIS — J9611 Chronic respiratory failure with hypoxia: Secondary | ICD-10-CM | POA: Diagnosis not present

## 2023-04-16 ENCOUNTER — Encounter (HOSPITAL_COMMUNITY): Payer: Self-pay

## 2023-04-16 ENCOUNTER — Emergency Department (HOSPITAL_COMMUNITY): Payer: Medicare HMO

## 2023-04-16 ENCOUNTER — Emergency Department (HOSPITAL_BASED_OUTPATIENT_CLINIC_OR_DEPARTMENT_OTHER): Payer: Medicare HMO

## 2023-04-16 ENCOUNTER — Emergency Department (HOSPITAL_BASED_OUTPATIENT_CLINIC_OR_DEPARTMENT_OTHER)
Admission: EM | Admit: 2023-04-16 | Discharge: 2023-04-17 | Disposition: A | Discharge status: Home / Self Care | Payer: Medicare HMO

## 2023-04-16 ENCOUNTER — Other Ambulatory Visit: Payer: Self-pay

## 2023-04-16 DIAGNOSIS — B9689 Other specified bacterial agents as the cause of diseases classified elsewhere: Secondary | ICD-10-CM | POA: Diagnosis not present

## 2023-04-16 DIAGNOSIS — E876 Hypokalemia: Secondary | ICD-10-CM | POA: Insufficient documentation

## 2023-04-16 DIAGNOSIS — R4701 Aphasia: Secondary | ICD-10-CM | POA: Diagnosis not present

## 2023-04-16 DIAGNOSIS — R29818 Other symptoms and signs involving the nervous system: Secondary | ICD-10-CM | POA: Diagnosis not present

## 2023-04-16 DIAGNOSIS — R197 Diarrhea, unspecified: Secondary | ICD-10-CM | POA: Diagnosis not present

## 2023-04-16 DIAGNOSIS — N390 Urinary tract infection, site not specified: Secondary | ICD-10-CM | POA: Insufficient documentation

## 2023-04-16 DIAGNOSIS — I491 Atrial premature depolarization: Secondary | ICD-10-CM | POA: Diagnosis not present

## 2023-04-16 DIAGNOSIS — R2990 Unspecified symptoms and signs involving the nervous system: Secondary | ICD-10-CM

## 2023-04-16 DIAGNOSIS — Z1152 Encounter for screening for COVID-19: Secondary | ICD-10-CM | POA: Insufficient documentation

## 2023-04-16 DIAGNOSIS — R4781 Slurred speech: Secondary | ICD-10-CM | POA: Insufficient documentation

## 2023-04-16 DIAGNOSIS — Z95811 Presence of heart assist device: Secondary | ICD-10-CM | POA: Insufficient documentation

## 2023-04-16 DIAGNOSIS — R059 Cough, unspecified: Secondary | ICD-10-CM | POA: Diagnosis not present

## 2023-04-16 DIAGNOSIS — G459 Transient cerebral ischemic attack, unspecified: Secondary | ICD-10-CM | POA: Diagnosis not present

## 2023-04-16 DIAGNOSIS — I10 Essential (primary) hypertension: Secondary | ICD-10-CM | POA: Diagnosis not present

## 2023-04-16 DIAGNOSIS — R4182 Altered mental status, unspecified: Secondary | ICD-10-CM | POA: Diagnosis not present

## 2023-04-16 DIAGNOSIS — R410 Disorientation, unspecified: Secondary | ICD-10-CM | POA: Diagnosis not present

## 2023-04-16 LAB — URINALYSIS, ROUTINE W REFLEX MICROSCOPIC
Bacteria, UA: NONE SEEN
Bilirubin Urine: NEGATIVE
Glucose, UA: NEGATIVE mg/dL
Ketones, ur: NEGATIVE mg/dL
Nitrite: NEGATIVE
Protein, ur: 30 mg/dL — AB
Specific Gravity, Urine: 1.025 (ref 1.005–1.030)
pH: 6 (ref 5.0–8.0)

## 2023-04-16 LAB — CBC
HCT: 38.7 % (ref 36.0–46.0)
Hemoglobin: 12.8 g/dL (ref 12.0–15.0)
MCH: 29.8 pg (ref 26.0–34.0)
MCHC: 33.1 g/dL (ref 30.0–36.0)
MCV: 90.2 fL (ref 80.0–100.0)
Platelets: 195 10*3/uL (ref 150–400)
RBC: 4.29 MIL/uL (ref 3.87–5.11)
RDW: 13.6 % (ref 11.5–15.5)
WBC: 4.5 10*3/uL (ref 4.0–10.5)
nRBC: 0 % (ref 0.0–0.2)

## 2023-04-16 LAB — COMPREHENSIVE METABOLIC PANEL
ALT: 28 U/L (ref 0–44)
AST: 52 U/L — ABNORMAL HIGH (ref 15–41)
Albumin: 3.9 g/dL (ref 3.5–5.0)
Alkaline Phosphatase: 55 U/L (ref 38–126)
Anion gap: 9 (ref 5–15)
BUN: 12 mg/dL (ref 8–23)
CO2: 28 mmol/L (ref 22–32)
Calcium: 10.3 mg/dL (ref 8.9–10.3)
Chloride: 102 mmol/L (ref 98–111)
Creatinine, Ser: 0.69 mg/dL (ref 0.44–1.00)
GFR, Estimated: >60 mL/min (ref >60)
Glucose, Bld: 108 mg/dL — ABNORMAL HIGH (ref 70–99)
Potassium: 3.2 mmol/L — ABNORMAL LOW (ref 3.5–5.1)
Sodium: 139 mmol/L (ref 135–145)
Total Bilirubin: 0.4 mg/dL (ref 0.3–1.2)
Total Protein: 6.9 g/dL (ref 6.5–8.1)

## 2023-04-16 LAB — LIPASE, BLOOD: Lipase: 37 U/L (ref 11–51)

## 2023-04-16 LAB — RESP PANEL BY RT-PCR (RSV, FLU A&B, COVID)  RVPGX2
Influenza A by PCR: NEGATIVE
Influenza B by PCR: NEGATIVE
Resp Syncytial Virus by PCR: NEGATIVE
SARS Coronavirus 2 by RT PCR: NEGATIVE

## 2023-04-16 LAB — CBG MONITORING, ED: Glucose-Capillary: 101 mg/dL — ABNORMAL HIGH (ref 70–99)

## 2023-04-16 MED ORDER — ACETAMINOPHEN 325 MG PO TABS
650.0000 mg | ORAL_TABLET | Freq: Once | ORAL | Status: AC
  Administered 2023-04-16: 650 mg via ORAL
  Filled 2023-04-16: qty 2

## 2023-04-16 MED ORDER — POTASSIUM CHLORIDE CRYS ER 20 MEQ PO TBCR
20.0000 meq | EXTENDED_RELEASE_TABLET | Freq: Once | ORAL | Status: AC
  Administered 2023-04-16: 20 meq via ORAL
  Filled 2023-04-16: qty 1

## 2023-04-16 NOTE — ED Triage Notes (Signed)
Family reports slurred speech this morning upon waking at 0900. Last seen at 1930 10/15 appeared to be at baseline. HX stroke several years ago. Treated last week for possible UTI- finished antibiotics. A+Ox4 in triage, unsteady on feet due to leg fx in May, no facial droop, no other neuro deficits, speech clear in triage.

## 2023-04-16 NOTE — ED Notes (Signed)
[facility] on the floor

## 2023-04-16 NOTE — ED Notes (Signed)
Thomas at [facility] sending transport for ED to [facility] transfer. Dr Briscoe Burns accepted at [facility]

## 2023-04-16 NOTE — ED Notes (Signed)
Patient transported to MRI 

## 2023-04-16 NOTE — ED Provider Notes (Addendum)
[facility] AT [facility] Provider Note   CSN: [id number] Arrival date & time: 04/16/23  1317     History  Chief Complaint  Patient presents with   Aphasia    Caitlin Mccarthy is a 87 y.o. female.  Presenting emergency department with altered mental status and slurred speech.  Last known normal at 1730 last night.  Awoke this morning had dysarthria and what appeared to be aphasia noted by family.  No facial droop, weakness, gait disturbance.  Daughter at bedside notes that she has been normal state exception of some diarrhea for the past 2 days.  No abdominal pain.  Daughters at bedside states that mother is seemingly back to normal, but has had a progressive decline in her mentation over the past month or 2.  Had similar episode a week ago had negative workup then and was discharged for urinary tract infection.  Patient is alert and oriented x 3.  Has no specific complaint.  Denies headache, vision changes, no chest pain no shortness of breath, no abdominal pain.        Home Medications Prior to Admission medications   Medication Sig Start Date End Date Taking? Authorizing Provider  acetaminophen (TYLENOL) 650 MG CR tablet Take 650 mg by mouth every 8 (eight) hours as needed for pain.   Yes [provider]  amLODipine-benazepril (LOTREL) 10-40 MG capsule Take 1 capsule by mouth daily. 03/17/23  Yes Daphine Deutscher, Mary-Margaret, FNP  aspirin EC 81 MG tablet Take 1 tablet (81 mg total) by mouth daily. 12/29/22  Yes Rolly Salter, MD  cephALEXin (KEFLEX) 500 MG capsule Take 1 capsule (500 mg total) by mouth 2 (two) times daily. 04/07/23  Yes Small, Brooke L, PA  citalopram (CELEXA) 20 MG tablet Take 1 tablet (20 mg total) by mouth daily. 03/17/23  Yes Daphine Deutscher, Mary-Margaret, FNP  erythromycin ophthalmic ointment Place 1 Application into the left eye 3 (three) times daily. 03/17/23  Yes Shipes, Mary-Margaret, FNP  fenofibrate (TRICOR) 145 MG tablet Take 1  tablet (145 mg total) by mouth daily. 03/17/23  Yes Dubinsky, Mary-Margaret, FNP  ibuprofen (ADVIL) 200 MG tablet Take 200 mg by mouth every 6 (six) hours as needed.   Yes [provider]  LORazepam (ATIVAN) 0.5 MG tablet Take 1 tablet (0.5 mg total) by mouth at bedtime. for anxiety 03/17/23  Yes Daphine Deutscher, Mary-Margaret, FNP  metFORMIN (GLUCOPHAGE-XR) 500 MG 24 hr tablet Take 1 tablet (500 mg total) by mouth 2 (two) times daily with a meal. 03/17/23  Yes Daphine Deutscher, Mary-Margaret, FNP  nystatin (MYCOSTATIN/NYSTOP) powder APPLY TOPICALLY FOUR TIMES DAILY Patient taking differently: Apply 1 Application topically 4 (four) times daily. 05/13/22  Yes Chiquito, Mary-Margaret, FNP  ondansetron (ZOFRAN-ODT) 4 MG disintegrating tablet TAKE 1 TABLET EVERY 8 HOURS AS NEEDED FOR NAUSEA AND VOMITING 08/13/21  Yes Daphine Deutscher, Mary-Margaret, FNP  pantoprazole (PROTONIX) 40 MG tablet Take 1 tablet (40 mg total) by mouth daily. 04/07/23  Yes Small, Brooke L, PA  polyethylene glycol (MIRALAX / GLYCOLAX) 17 g packet Take 17 g by mouth daily.   Yes [provider]  PREMARIN vaginal cream Apply 1 application topically daily as needed (irritation).  12/06/16  Yes [provider]  rosuvastatin (CRESTOR) 40 MG tablet TAKE ONE (1) TABLET EACH DAY 03/17/23  Yes Daphine Deutscher, Mary-Margaret, FNP  traMADol (ULTRAM) 50 MG tablet Take 1 tablet (50 mg total) by mouth 2 (two) times daily. 03/21/23  Yes Daphine Deutscher Mary-Margaret, FNP  Vitamin D, Ergocalciferol, (DRISDOL) 1.25  MG (50000 UNIT) CAPS capsule Take 1 capsule (50,000 Units total) by mouth every 7 (seven) days. 12/05/22  Yes Rolly Salter, MD  Alcohol Swabs (B-D SINGLE USE SWABS REGULAR) PADS Test BS daily and as needed Dx E11.65 01/05/20   Raliegh Ip, DO  baclofen (LIORESAL) 10 MG tablet Take 1 tablet (10 mg total) by mouth 3 (three) times daily. Patient not taking: Reported on 04/16/2023 12/17/22   Bennie Pierini, FNP  Blood Glucose Calibration (TRUE METRIX LEVEL 1)  Low SOLN Use with glucose monitor Dx E11.65 01/05/20   Delynn Flavin M, DO  Blood Glucose Monitoring Suppl (TRUE METRIX AIR GLUCOSE METER) w/Device KIT Test BS daily and as needed Dx E11.65 01/05/20   Raliegh Ip, DO  docusate sodium (COLACE) 100 MG capsule Take 1 capsule (100 mg total) by mouth 2 (two) times daily. Patient not taking: Reported on 04/16/2023 11/29/22   Rolly Salter, MD  glucose blood (TRUE METRIX BLOOD GLUCOSE TEST) test strip Test BS daily and as needed Dx E11.65 01/05/20   Delynn Flavin M, DO  oxyCODONE (OXY IR/ROXICODONE) 5 MG immediate release tablet Take 1 tablet (5 mg total) by mouth every 4 (four) hours as needed for severe pain. Patient not taking: Reported on 04/16/2023 12/03/22   Rolly Salter, MD  TRUEplus Lancets 33G MISC Test BS daily and as needed Dx E11.65 01/05/20   Raliegh Ip, DO      Allergies    Gabapentin    Review of Systems   Review of Systems  Physical Exam Updated Vital Signs BP (!) 152/79   Pulse 68   Temp 98.8 F (37.1 C) (Oral)   Resp 17   Ht 5\' 10"  (1.778 m)   Wt 83.9 kg   SpO2 96%   BMI 26.54 kg/m  Physical Exam Vitals and nursing note reviewed.  Constitutional:      General: She is not in acute distress.    Appearance: She is not toxic-appearing.  HENT:     Head: Normocephalic.     Nose: Nose normal.     Mouth/Throat:     Mouth: Mucous membranes are moist.  Eyes:     Conjunctiva/sclera: Conjunctivae normal.  Cardiovascular:     Rate and Rhythm: Normal rate and regular rhythm.  Pulmonary:     Effort: Pulmonary effort is normal.  Abdominal:     General: Abdomen is flat. There is no distension.     Palpations: Abdomen is soft.     Tenderness: There is no abdominal tenderness. There is no guarding or rebound.  Musculoskeletal:        General: Normal range of motion.  Skin:    General: Skin is warm.     Capillary Refill: Capillary refill takes less than 2 seconds.  Neurological:     General: No focal  deficit present.     Mental Status: She is alert and oriented to person, place, and time.     Cranial Nerves: No cranial nerve deficit.     Sensory: No sensory deficit.     Motor: No weakness.     Coordination: Coordination normal.  Psychiatric:        Mood and Affect: Mood normal.        Behavior: Behavior normal.     ED Results / Procedures / Treatments   Labs (all labs ordered are listed, but only abnormal results are displayed) Labs Reviewed  COMPREHENSIVE METABOLIC PANEL - Abnormal; Notable for the following components:  Result Value   Potassium 3.2 (*)    Glucose, Bld 108 (*)    AST 52 (*)    All other components within normal limits  CBG MONITORING, ED - Abnormal; Notable for the following components:   Glucose-Capillary 101 (*)    All other components within normal limits  RESP PANEL BY RT-PCR (RSV, FLU A&B, COVID)  RVPGX2  CBC  LIPASE, BLOOD  URINALYSIS, ROUTINE W REFLEX MICROSCOPIC    EKG None  Radiology No results found.  Procedures Procedures    Medications Ordered in ED Medications  potassium chloride SA (KLOR-CON M) CR tablet 20 mEq (has no administration in time range)    ED Course/ Medical Decision Making/ A&P Clinical Course as of 04/16/23 1415  Wed Apr 16, 2023  1343 Seen in ED on 10/7: "Amount and/or Complexity of Data Reviewed Labs: ordered.    Details: Trace bacteria in the urine, labs otherwise unremarkable Radiology: ordered.    Details: CT head, MRI showed no evidence of acute CVA, chest x-ray is unremarkable except for a nodule, in the chest Discussion of management or test interpretation with external provider(s): Discussed with Dr. Durwin Nora, he states to go ahead and treat for urinary tract infection, given few bacteria in urine, and confusion.  He states this may represent delirium.  I discussed with the patient's, family, concern for delirium and we discussed admission, they declined at this time, but would like the urinary tract  infection treated.  I sent her home on Keflex, and follow-up with PCP.  Her symptoms may also represent onset of dementia, I recommend that they follow-up with her primary care doctor, and a possible neuropsychologist, for further evaluation.  She was alert and oriented, on my second exam, and on discharge, she knew who she was, where she was and what year it was.  We discussed return precautions and she was discharged home.  Family was also requesting something for acid reflux, I started her on pantoprazole."  [TY]  1344 Patient has known normal 1930 last night, woke up with confusion, slurred speech and aphasia per family reports.  Symptoms seemingly resolved and is largely back to normal per daughter, but still has some of the lingering confusion.  Reports frequent diarrhea the past 2 days. [TY]  1406 Spoke with neurology who thinks it this is likely due to mental status/delirium possible infection rather than acute TIA.  Recommend ED transfer for MRI.  If MRI is negative, would look for alternative causes for patient's confusion and confusion. [TY]  1411 Accepted ED to ED transfer to [facility] for MRI by Dr.Naasz [TY]    Clinical Course User Index [TY] Coral Spikes, DO                                 Medical Decision Making This is a 87 year old female presenting emergency department for slurred speech and aphasia.  Symptoms seemingly resolved.  She has no localizing deficits on exam.  Per chart review similar episode a week ago, discharged with UTI on Keflex.  Case discussed with neurology; see ED course.  Lab workup significant for mild low potassium, but no other significant metabolic derangements given her diarrhea.  Normal kidney function.  No leukocytosis or fever tachycardia to suggest systemic infection.  Blood sugar is not elevated unlikely to cause.  Pancreas normal; unlikely pancreatitis.  Accepted ED to ED transfer for MRI to evaluate for possible TIA/stroke.  Amount and/or  Complexity of Data Reviewed Labs: ordered. Radiology: ordered.  Risk Prescription drug management.   Given neurologist thought that symptoms could be secondary to delirium evaluated for infectious cause such as flu/COVID.  That was negative.       Final Clinical Impression(s) / ED Diagnoses Final diagnoses:  Stroke-like symptoms    Rx / DC Orders ED Discharge Orders     None         Coral Spikes, DO 04/16/23 1415    Coral Spikes, DO 05/03/23 1759

## 2023-04-16 NOTE — ED Notes (Signed)
MRI contacted RN and they are going to come for transport.

## 2023-04-16 NOTE — ED Triage Notes (Signed)
Pt here via [facility]

## 2023-04-16 NOTE — ED Notes (Signed)
Pt c/o mild headache, requested Tylenol. MD aware

## 2023-04-17 MED ORDER — NITROFURANTOIN MONOHYD MACRO 100 MG PO CAPS: 100.0000 mg | ORAL_CAPSULE | Freq: Two times a day (BID) | ORAL | 0 refills | Status: DC

## 2023-04-17 NOTE — ED Provider Notes (Signed)
Received as a transfer for MRI.  Patient with encephalopathy episode that began yesterday.  Reviewing records reveals that it was recommended she get an MRI, if negative, does not need further TIA/stroke workup.  MRI does not show acute stroke.  Reviewing records reveals an abnormal urinalysis.  No bacteria seen.  I have reviewed her records and her most recent urinalysis looked similar and culture did not grow a pathogen.  Her culture prior to that, however, grew Enterobacter cloacae that was resistant to cephalosporins.  It appears that she was prescribed Keflex by previous provider today.  I will change this to Macrobid based on this culture.  Patient could not provide additional urine for repeat culture today.  Family concerned as this is a second episode.  Will refer to neurology.  Patient also has a loop recorder and I suppose there was some concern for A-fib in the past.  She is in sinus rhythm today.  Refer back to cardiology for further consideration of arrhythmia.  Patient will establish follow-up with primary care as soon as possible as well.  Given return precautions.   Gilda Crease, MD 04/17/23 [id number]

## 2023-04-21 ENCOUNTER — Encounter (HOSPITAL_BASED_OUTPATIENT_CLINIC_OR_DEPARTMENT_OTHER): Payer: Self-pay | Admitting: Cardiovascular Disease

## 2023-04-21 ENCOUNTER — Ambulatory Visit (INDEPENDENT_AMBULATORY_CARE_PROVIDER_SITE_OTHER): Payer: Medicare HMO | Admitting: Cardiovascular Disease

## 2023-04-21 ENCOUNTER — Encounter (HOSPITAL_BASED_OUTPATIENT_CLINIC_OR_DEPARTMENT_OTHER): Payer: Self-pay | Admitting: *Deleted

## 2023-04-21 ENCOUNTER — Ambulatory Visit: Payer: Medicare HMO | Admitting: Neurology

## 2023-04-21 ENCOUNTER — Telehealth (INDEPENDENT_AMBULATORY_CARE_PROVIDER_SITE_OTHER): Payer: Medicare HMO | Admitting: Nurse Practitioner

## 2023-04-21 ENCOUNTER — Encounter: Payer: Self-pay | Admitting: Nurse Practitioner

## 2023-04-21 VITALS — BP 160/62 | HR 66 | Ht 70.0 in | Wt 191.0 lb

## 2023-04-21 DIAGNOSIS — Z5181 Encounter for therapeutic drug level monitoring: Secondary | ICD-10-CM

## 2023-04-21 DIAGNOSIS — I480 Paroxysmal atrial fibrillation: Secondary | ICD-10-CM | POA: Diagnosis not present

## 2023-04-21 DIAGNOSIS — E1169 Type 2 diabetes mellitus with other specified complication: Secondary | ICD-10-CM | POA: Diagnosis not present

## 2023-04-21 DIAGNOSIS — G9340 Encephalopathy, unspecified: Secondary | ICD-10-CM

## 2023-04-21 DIAGNOSIS — I4891 Unspecified atrial fibrillation: Secondary | ICD-10-CM

## 2023-04-21 DIAGNOSIS — I10 Essential (primary) hypertension: Secondary | ICD-10-CM

## 2023-04-21 DIAGNOSIS — E785 Hyperlipidemia, unspecified: Secondary | ICD-10-CM | POA: Diagnosis not present

## 2023-04-21 DIAGNOSIS — R059 Cough, unspecified: Secondary | ICD-10-CM | POA: Diagnosis not present

## 2023-04-21 HISTORY — DX: Paroxysmal atrial fibrillation: I48.0

## 2023-04-21 MED ORDER — DOXYCYCLINE HYCLATE 100 MG PO CAPS: 100.0000 mg | ORAL_CAPSULE | Freq: Two times a day (BID) | ORAL | 0 refills | Status: DC

## 2023-04-21 MED ORDER — HYDROCHLOROTHIAZIDE 25 MG PO TABS: 25.0000 mg | ORAL_TABLET | Freq: Every day | ORAL | 3 refills | Status: DC

## 2023-04-21 NOTE — Progress Notes (Signed)
Cardiology Office Note:  .    Date:  04/21/2023  ID:  Caitlin Mccarthy, DOB 08-22-1935, MRN [id number] PCP: Bennie Pierini, FNP   [facility] Providers Cardiologist:  Prentice Docker, MD (Inactive)     History of Present Illness: Caitlin Mccarthy    Caitlin Mccarthy is a 87 y.o. female with CVA, hypertension, hyperlipidemia, type 2 diabetes, COPD, GERD, osteoarthritis, who presents for evaluation of stroke-like symptoms and consideration of arrhythmia at the request of Dr. Jaci Carrel. She presented to the ER 04/16/2023 with symptoms of altered mental status, dysarthria, and slurred speech. Subsequently transferred to [facility] ED for MRI which did not show acute stroke. She had been seen at the ER for a similar episode 10/7 with negative workup and treated for UTI. As this was the second episode she was also referred to neurology. It was noted that she had a loop recorder and was referred to cardiology for further consideration of arrhythmia.  Previously followed by Dr. Prentice Docker, last seen by him 06/03/2019 at which time she was symptomatically stable with no evidence of atrial fibrillation per device interrogation. She had been admitted 01/09/2017 with acute CVA and was started on Eliquis. Telemetry had noted sinus rhythm with frequent PACs but no documented arrhythmias. She wore a 30 day monitor that did not show any atrial fibrillation. 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 Her last interrogation in 09/2020 revealed two short episodes of atrial fibrillation. She was advised  Today, she is accompanied by her daughter who also acts as the main historian. Previously she had a fall in 10/2022 resulting in a fractured femur, and she hasn't felt well since. She complains of feeling short of breath and fatigued with no energy. No chest pain or pressure. Since her fall she has not been formally exercising. However, she continues to complete her physical  therapy exercises at home. She has struggled with aphasic spells recently prompting her recent ED visits. In the last few days she has been doing relatively well aside from recovering from a recent cold. She complains of chest congestion and has been taking Mucinex. In the office today her blood pressure is 180/70 initially, and 160/62 on manual recheck. She did already take her medications this morning. Her daughter reports that her home blood pressures are intermittently high, and usually closer to 156 systolic for instance. Occasionally she notices mild swelling, mostly in her left leg. Additionally she complains of mild persistent constipation. She denies any palpitations, chest pain, lightheadedness, headaches, syncope, orthopnea, or PND.  ROS:  Please see the history of present illness. All other systems are reviewed and negative.  (+) Shortness of breath (+) Fatigue (+) Chest congestion (+) Intermittent left LE edema (+) Persistent constipation  Studies Reviewed: .        Echo  2022/11/23: Sonographer Comments: Technically difficult due to COPD  IMPRESSIONS   1. Left ventricular ejection fraction, by estimation, is >75%. The left  ventricle has hyperdynamic function. The left ventricle has no regional  wall motion abnormalities. Left ventricular diastolic parameters are  indeterminate.   2. Right ventricular systolic function is normal. The right ventricular  size is normal. Tricuspid regurgitation signal is inadequate for assessing  PA pressure.   3. No obvious SAM (systolic anterior motion of mitral valve). The mitral  valve is normal in structure. Trivial mitral valve regurgitation. No  evidence of mitral stenosis.   4. The aortic valve is normal in structure. Aortic valve regurgitation is  not visualized. No aortic stenosis is present.   5. The inferior vena cava is dilated in size with >50% respiratory  variability, suggesting right atrial pressure of 8 mmHg.   Risk  Assessment/Calculations:    CHA2DS2-VASc Score = 7   This indicates a 11.2% annual risk of stroke. The patient's score is based upon: CHF History: 0 HTN History: 1 Diabetes History: 1 Stroke History: 2 Vascular Disease History: 0 Age Score: 2 Gender Score: 1    HYPERTENSION CONTROL Vitals:   04/21/23 0909 04/21/23 0938  BP: (!) 180/70 (!) 160/62    The patient's blood pressure is elevated above target today.  In order to address the patient's elevated BP: A new medication was prescribed today.          Physical Exam:    VS:  BP (!) 160/62 (BP Location: Right Arm, Patient Position: Sitting, Cuff Size: Normal)   Pulse 66   Ht 5\' 10"  (1.778 m)   Wt 191 lb (86.6 kg)   SpO2 91%   BMI 27.41 kg/m  , BMI Body mass index is 27.41 kg/m. GENERAL:  Well appearing; ambulating independently in wheelchair. HEENT: Pupils equal round and reactive, fundi not visualized, oral mucosa unremarkable NECK:  No jugular venous distention, waveform within normal limits, carotid upstroke brisk and symmetric, no bruits, no thyromegaly LUNGS:  Mild expiratory wheezing bilaterally.  HEART:  RRR.  PMI not displaced or sustained,S1 and S2 within normal limits, no S3, no S4, no clicks, no rubs, no murmurs ABD:  Flat, positive bowel sounds normal in frequency in pitch, no bruits, no rebound, no guarding, no midline pulsatile mass, no hepatomegaly, no splenomegaly EXT:  2 plus pulses throughout, no edema, no cyanosis no clubbing SKIN:  No rashes no nodules NEURO:  Cranial nerves II through XII grossly intact, motor grossly intact throughout PSYCH:  Cognitively intact, oriented to person place and time  Wt Readings from Last 3 Encounters:  04/21/23 191 lb (86.6 kg)  04/16/23 185 lb (83.9 kg)  04/07/23 185 lb (83.9 kg)     ASSESSMENT AND PLAN: .    # Hypertension Elevated blood pressure readings consistently above 130/80. Currently on maximum dose of Amlodipine/Benazepril. -Add Hydrochlorothiazide  25mg  once daily. -Recheck labs in 1 week to monitor potassium, kidney function, and electrolytes.  She had hypokalemia in the hospital last week, but this was the only time and occurred in the setting of UTI -Encourage home blood pressure monitoring and bring records to next appointment.   # H/o CVA:  # Possible Transient Ischemic Attack (TIA): Recent episodes of confusion and speech difficulty, with a history of atrial fibrillation. Loop recorder is no longer active.  Prior episodes of atrial fibrillation were fleeting and she was not started on anticoagulation.  -Order 30-day heart rhythm monitor to rule out atrial fibrillation.  Consider reactivating ILR if no AF is seen on her 30 day monitor.  I suspect that her encephalopathy was caused by UTI rather than TIA.  It has resolved with antibiotics.  -Continue daily aspirin. -She has been referred to Neurology for further evaluation.  # Respiratory Symptoms Recent cold with persistent wheezing and congestion. Chest X-ray showed a hazy area in the right lower lung. -Order non-emergent chest CT scan to further evaluate lung findings.  This was recommended by radiology in the ED.  # Deconditioning Limited mobility and exercise, leading to shortness of breath with minimal exertion. -Encourage increased physical activity at home, such as walking during commercial breaks and  using a peddler while seated. -Echo showed normal systolic function with LVEF >75% but no significant LVH or SAM.  BP control as above.  No evidence of HCM.  Follow-up in 6 weeks to reassess blood pressure control and discuss results of heart rhythm monitor and chest CT scan.     Dispo:  FU with APP in 6 weeks.  I,Mathew Stumpf,acting as a Neurosurgeon for Chilton Si, MD.,have documented all relevant documentation on the behalf of Chilton Si, MD,as directed by  Chilton Si, MD while in the presence of Chilton Si, MD.  I, Lillybeth Tal C. Duke Salvia, MD have reviewed  all documentation for this visit.  The documentation of the exam, diagnosis, procedures, and orders on 04/21/2023 are all accurate and complete.   Signed, Chilton Si, MD

## 2023-04-21 NOTE — Patient Instructions (Addendum)
Medication Instructions:  START HYDROCHLOROTHIAZIDE 25 MG DAILY   *If you need a refill on your cardiac medications before your next appointment, please call your pharmacy*  Lab Work: BMET IN 1 WEEK   If you have labs (blood work) drawn today and your tests are completely normal, you will receive your results only by: MyChart Message (if you have MyChart) OR A paper copy in the mail If you have any lab test that is abnormal or we need to change your treatment, we will call you to review the results.  Testing/Procedures: Non-Cardiac CT scanning, (CAT scanning), is a noninvasive, special x-ray that produces cross-sectional images of the body using x-rays and a computer. CT scans help physicians diagnose and treat medical conditions. For some CT exams, a contrast material is used to enhance visibility in the area of the body being studied. CT scans provide greater clarity and reveal more details than regular x-ray exams. OF CHEST   Your physician has recommended that you wear an event monitor. Event monitors are medical devices that record the heart's electrical activity. Doctors most often [place] these monitors to diagnose arrhythmias. Arrhythmias are problems with the speed or rhythm of the heartbeat. The monitor is a small, portable device. You can wear one while you do your normal daily activities. This is usually used to diagnose what is causing palpitations/syncope (passing out). THIS WILL BE  MAILED TO YOU   Follow-Up: At [facility], you and your health needs are our priority.  As part of our continuing mission to provide you with exceptional heart care, we have created designated Provider Care Teams.  These Care Teams include your primary Cardiologist (physician) and Advanced Practice Providers (APPs -  Physician Assistants and Nurse Practitioners) who all work together to provide you with the care you need, when you need it.  We recommend signing up for the patient portal called  "MyChart".  Sign up information is provided on this After Visit Summary.  MyChart is used to connect with patients for Virtual Visits (Telemedicine).  Patients are able to view lab/test results, encounter notes, upcoming appointments, etc.  Non-urgent messages can be sent to your provider as well.   To learn more about what you can do with MyChart, go to [email or web address].    Your next appointment:   6 week(s)  Provider:   Gillian Shields, NP    MONITOR YOUR BLOOD PRESSURE AND BRING TO FOLLOW UP   [employer] Cardiac Event Monitor Instructions  Your physician has requested you wear your cardiac event monitor for _____ days, (1-30). [employer] may call or text to confirm a shipping address. The monitor will be sent to a land address via UPS. [employer] will not ship a monitor to a PO BOX. It typically takes 3-5 days to receive your monitor after it has been enrolled. [employer] will assist with USPS tracking if your package is delayed. The telephone number for [employer] is [phone]. Once you have received your monitor, please review the enclosed instructions. Instruction tutorials can also be viewed under help and settings on the enclosed cell phone. Your monitor has already been registered assigning a specific monitor serial # to you.  Billing and Self Pay Discount Information  [employer] has been provided the insurance information we had on file for you.  If your insurance has been updated, please call [employer] at [phone] to provide them with your updated insurance information.   [employer] offers a discounted Self Pay option for patients who have insurance that does  not cover their cardiac event monitor or patients without insurance.  The discounted cost of a Self Pay Cardiac Event Monitor would be $225.00 , if the patient contacts [employer] at [phone] within 7 days of applying the monitor to make payment arrangements.  If the patient does not contact [employer]  within 7 days of applying the monitor, the cost of the cardiac event monitor will be $350.00.  Applying the monitor  Remove cell phone from case and turn it on. The cell phone works as IT consultant and needs to be within [employer] of you at all times. The cell phone will need to be charged on a daily basis. We recommend you plug the cell phone into the enclosed charger at your bedside table every night.  Monitor batteries: You will receive two monitor batteries labelled #1 and #2. These are your recorders. Plug battery #2 onto the second connection on the enclosed charger. Keep one battery on the charger at all times. This will keep the monitor battery deactivated. It will also keep it fully charged for when you need to switch your monitor batteries. A small light will be blinking on the battery emblem when it is charging. The light on the battery emblem will remain on when the battery is fully charged.  Open package of a Monitor strip. Insert battery #1 into black hood on strip and gently squeeze monitor battery onto connection as indicated in instruction booklet. Set aside while preparing skin.  Choose location for your strip, vertical or horizontal, as indicated in the instruction booklet. Shave to remove all hair from location. There cannot be any lotions, oils, powders, or colognes on skin where monitor is to be applied. Wipe skin clean with enclosed Saline wipe. Dry skin completely.  Peel paper labeled #1 off the back of the Monitor strip exposing the adhesive. Place the monitor on the chest in the vertical or horizontal position shown in the instruction booklet. One arrow on the monitor strip must be pointing upward. Carefully remove paper labeled #2, attaching remainder of strip to your skin. Try not to create any folds or wrinkles in the strip as you apply it.  Firmly press and release the circle in the center of the monitor battery. You will hear a small beep. This is turning  the monitor battery on. The heart emblem on the monitor battery will light up every 5 seconds if the monitor battery in turned on and connected to the patient securely. Do not push and hold the circle down as this turns the monitor battery off. The cell phone will locate the monitor battery. A screen will appear on the cell phone checking the connection of your monitor strip. This may read poor connection initially but change to good connection within the next minute. Once your monitor accepts the connection you will hear a series of 3 beeps followed by a climbing crescendo of beeps. A screen will appear on the cell phone showing the two monitor strip placement options. Touch the picture that demonstrates where you applied the monitor strip.  Your monitor strip and battery are waterproof. You are able to shower, bathe, or swim with the monitor on. They just ask you do not submerge deeper than 3 feet underwater. We recommend removing the monitor if you are swimming in a lake, river, or ocean.  Your monitor battery will need to be switched to a fully charged monitor battery approximately once a week. The cell phone will alert you of an action which  needs to be made.  On the cell phone, tap for details to reveal connection status, monitor battery status, and cell phone battery status. The green dots indicates your monitor is in good status. A red dot indicates there is something that needs your attention.  To record a symptom, click the circle on the monitor battery. In 30-60 seconds a list of symptoms will appear on the cell phone. Select your symptom and tap save. Your monitor will record a sustained or significant arrhythmia regardless of you clicking the button. Some patients do not feel the heart rhythm irregularities. [employer] will notify us of any serious or critical events.  Refer to instruction booklet for instructions on switching batteries, changing strips, the Do not disturb or  Pause features, or any additional questions.  Call [employer] at [phone], to confirm your monitor is transmitting and record your baseline. They will answer any questions you may have regarding the monitor instructions at that time.  Returning the monitor to [employer]  Place all equipment back into blue box. Peel off strip of paper to expose adhesive and close box securely. There is a prepaid UPS shipping label on this box. Drop in a UPS drop box, or at a UPS facility like [employer]. You may also contact [employer] to arrange UPS to pick up monitor package at your home.

## 2023-04-21 NOTE — Progress Notes (Signed)
Virtual Visit via video Note Due to COVID-19 pandemic this visit was conducted virtually. This visit type was conducted due to national recommendations for restrictions regarding the COVID-19 Pandemic (e.g. social distancing, sheltering in place) in an effort to limit this patient's exposure and mitigate transmission in our community. All issues noted in this document were discussed and addressed.  A physical exam was not performed with this format.   I connected with Caitlin Mccarthy on 04/21/2023 at name and DOB by name DOB and verified that I am speaking with the correct person using two identifiers. Caitlin Mccarthy is currently located at home and  caretaker  is currently with them during visit. The provider, Martina Sinner, DNP is located in their office at time of visit.  I discussed the limitations, risks, security and privacy concerns of performing an evaluation and management service by virtual visit and the availability of in person appointments. I also discussed with the patient that there may be a patient responsible charge related to this service. The patient expressed understanding and agreed to proceed.  Subjective:  Patient ID: Caitlin Mccarthy, female    DOB: 25-May-1936, 87 y.o.   MRN: [id number]  Chief Complaint:  Cough (For 3 days)   HPI: [name] is a 87 y.o. female presenting on 04/21/2023 for Cough (For 3 days)   HPI  [place] was seen by her cardiologist this morning  " she was experiencing wheezing and O2 saturation was 91 at the dr. Isidore Moos this morning" . She has been expiring cough for 3-days  "clear sputum, we have been OTC Mucinex that seems to help". She is eating and drinking as usual. Denies fever  Relevant past medical, surgical, family, and social history reviewed and updated as indicated.  Allergies and medications reviewed and updated.   Past Medical History:  Diagnosis Date   Anxiety    Depression    GERD (gastroesophageal  reflux disease)    History of acute pyelonephritis    last episode 04-13-2015  w/ sepsis   History of kidney stones    History of recurrent UTIs    MULTIPLE   Hyperlipidemia    Hypertension    OA (osteoarthritis)    knees, hips, hands   Osteoporosis    PAF (paroxysmal atrial fibrillation) (HCC) 04/21/2023   Poor memory    especially when has uti   Renal calculus, left    Stroke (HCC)    Type 2 diabetes mellitus (HCC)    Vitamin D deficiency 05/10/2016    Past Surgical History:  Procedure Laterality Date   CATARACT EXTRACTION W/PHACO  06/06/2011   Procedure: CATARACT EXTRACTION PHACO AND INTRAOCULAR LENS PLACEMENT (IOC);  Surgeon: Gemma Payor;  Location: [facility];  Service: Ophthalmology;  Laterality: Right;  CDE=12.77   CATARACT EXTRACTION W/PHACO  06/27/2011   Procedure: CATARACT EXTRACTION PHACO AND INTRAOCULAR LENS PLACEMENT (IOC);  Surgeon: Gemma Payor;  Location: [facility];  Service: Ophthalmology;  Laterality: Left;  CDE:13.96   CYSTOSCOPY WITH URETEROSCOPY AND STENT PLACEMENT Right 04/27/2015   Procedure: CYSTOSCOPY WITH RIGHT URETEROSCOPY, BASKET REMOVAL OF STONE, REMOVAL OF RIGHT NEPHROSTOMY TUBE;  Surgeon: Bjorn Pippin, MD;  Location: [facility];  Service: Urology;  Laterality: Right;   CYSTOSCOPY/URETEROSCOPY/HOLMIUM LASER/STENT PLACEMENT Left 12/24/2016   Procedure: LEFT URETEROSCOPY WITH HOLMIUM LASER AND STENT PLACEMENT;  Surgeon: Bjorn Pippin, MD;  Location: [facility];  Service: Urology;  Laterality: Left;   EXTRACORPOREAL SHOCK WAVE LITHOTRIPSY  left 04-01-2016;  1980's   FRACTURE SURGERY     HIP ARTHROPLASTY     HOLMIUM LASER APPLICATION Right 04/27/2015   Procedure: HOLMIUM LASER APPLICATION;  Surgeon: Bjorn Pippin, MD;  Location: [facility];  Service: Urology;  Laterality: Right;   KNEE ARTHROPLASTY     KNEE ARTHROSCOPY Bilateral right 2005//  left ?   LOOP RECORDER INSERTION N/A 04/01/2017   Procedure: LOOP RECORDER  INSERTION;  Surgeon: Hillis Range, MD;  Location: [facility];  Service: Cardiovascular;  Laterality: N/A;   ORIF FEMUR FRACTURE Right 11/27/2022   Procedure: OPEN REDUCTION INTERNAL FIXATION (ORIF) DISTAL FEMUR FRACTURE;  Surgeon: Roby Lofts, MD;  Location: [facility];  Service: Orthopedics;  Laterality: Right;   TOTAL HIP ARTHROPLASTY  10/28/2011   Procedure: TOTAL HIP ARTHROPLASTY;  Surgeon: Loanne Drilling, MD;  Location: [facility];  Service: Orthopedics;  Laterality: Right;   TOTAL KNEE ARTHROPLASTY Bilateral left 03-09-2007//  right 2006   TRANSTHORACIC ECHOCARDIOGRAM  10/06/2006   normal echo,  ef 55-60%   TUBAL LIGATION      Social History   Socioeconomic History   Marital status: Married    Spouse name: Ted   Number of children: 2   Years of education: 11   Highest education level: Not on file  Occupational History   Occupation: Retired    Comment: retired  Tobacco Use   Smoking status: Former    Current packs/day: 0.00    Average packs/day: 0.3 packs/day for 42.0 years (10.5 ttl pk-yrs)    Types: Cigarettes    Start date: 10/31/1980    Quit date: 11/01/2022    Years since quitting: 0.4   Smokeless tobacco: Never   Tobacco comments:    1 pack last 4 days   Vaping Use   Vaping status: Never Used  Substance and Sexual Activity   Alcohol use: No   Drug use: No   Sexual activity: Not Currently    Birth control/protection: Post-menopausal  Other Topics Concern   Not on file  Social History Narrative   Lives with husband   Her daughters take turns coming daily.   Social Determinants of Health   Financial Resource Strain: Low Risk  (02/26/2023)   Overall Financial Resource Strain (CARDIA)    Difficulty of Paying Living Expenses: Not hard at all  Food Insecurity: No Food Insecurity (02/26/2023)   Hunger Vital Sign    Worried About Running Out of Food in the Last Year: Never true    Ran Out of Food in the Last Year: Never true  Transportation Needs: No  Transportation Needs (02/26/2023)   PRAPARE - Administrator, Civil Service (Medical): No    Lack of Transportation (Non-Medical): No  Physical Activity: Insufficiently Active (02/26/2023)   Exercise Vital Sign    Days of Exercise per Week: 3 days    Minutes of Exercise per Session: 30 min  Stress: No Stress Concern Present (02/26/2023)   [employer] of Occupational Health - Occupational Stress Questionnaire    Feeling of Stress : Not at all  Social Connections: Moderately Integrated (02/26/2023)   Social Connection and Isolation Panel [NHANES]    Frequency of Communication with Friends and Family: More than three times a week    Frequency of Social Gatherings with Friends and Family: More than three times a week    Attends Religious Services: More than 4 times per year    Active Member of Clubs or Organizations: No  Attends Banker Meetings: Never    Marital Status: Married  Catering manager Violence: Not At Risk (02/26/2023)   Humiliation, Afraid, Rape, and Kick questionnaire    Fear of Current or Ex-Partner: No    Emotionally Abused: No    Physically Abused: No    Sexually Abused: No    Outpatient Encounter Medications as of 04/21/2023  Medication Sig   doxycycline (VIBRAMYCIN) 100 MG capsule Take 1 capsule (100 mg total) by mouth 2 (two) times daily.   acetaminophen (TYLENOL) 650 MG CR tablet Take 650 mg by mouth every 8 (eight) hours as needed for pain.   Alcohol Swabs (B-D SINGLE USE SWABS REGULAR) PADS Test BS daily and as needed Dx E11.65   amLODipine-benazepril (LOTREL) 10-40 MG capsule Take 1 capsule by mouth daily.   aspirin EC 81 MG tablet Take 1 tablet (81 mg total) by mouth daily.   baclofen (LIORESAL) 10 MG tablet Take 1 tablet (10 mg total) by mouth 3 (three) times daily. (Patient not taking: Reported on 04/16/2023)   Blood Glucose Calibration (TRUE METRIX LEVEL 1) Low SOLN Use with glucose monitor Dx E11.65   Blood Glucose Monitoring  Suppl (TRUE METRIX AIR GLUCOSE METER) w/Device KIT Test BS daily and as needed Dx E11.65   citalopram (CELEXA) 20 MG tablet Take 1 tablet (20 mg total) by mouth daily.   docusate sodium (COLACE) 100 MG capsule Take 1 capsule (100 mg total) by mouth 2 (two) times daily. (Patient not taking: Reported on 04/16/2023)   erythromycin ophthalmic ointment Place 1 Application into the left eye 3 (three) times daily.   fenofibrate (TRICOR) 145 MG tablet Take 1 tablet (145 mg total) by mouth daily.   glucose blood (TRUE METRIX BLOOD GLUCOSE TEST) test strip Test BS daily and as needed Dx E11.65   ibuprofen (ADVIL) 200 MG tablet Take 200 mg by mouth every 6 (six) hours as needed.   LORazepam (ATIVAN) 0.5 MG tablet Take 1 tablet (0.5 mg total) by mouth at bedtime. for anxiety   metFORMIN (GLUCOPHAGE-XR) 500 MG 24 hr tablet Take 1 tablet (500 mg total) by mouth 2 (two) times daily with a meal.   nitrofurantoin, macrocrystal-monohydrate, (MACROBID) 100 MG capsule Take 1 capsule (100 mg total) by mouth 2 (two) times daily. X 7 days   nystatin (MYCOSTATIN/NYSTOP) powder APPLY TOPICALLY FOUR TIMES DAILY (Patient taking differently: Apply 1 Application topically 4 (four) times daily.)   ondansetron (ZOFRAN-ODT) 4 MG disintegrating tablet TAKE 1 TABLET EVERY 8 HOURS AS NEEDED FOR NAUSEA AND VOMITING   pantoprazole (PROTONIX) 40 MG tablet Take 1 tablet (40 mg total) by mouth daily.   polyethylene glycol (MIRALAX / GLYCOLAX) 17 g packet Take 17 g by mouth daily.   PREMARIN vaginal cream Apply 1 application topically daily as needed (irritation).    rosuvastatin (CRESTOR) 40 MG tablet TAKE ONE (1) TABLET EACH DAY   traMADol (ULTRAM) 50 MG tablet Take 1 tablet (50 mg total) by mouth 2 (two) times daily.   TRUEplus Lancets 33G MISC Test BS daily and as needed Dx E11.65   Vitamin D, Ergocalciferol, (DRISDOL) 1.25 MG (50000 UNIT) CAPS capsule Take 1 capsule (50,000 Units total) by mouth every 7 (seven) days.    [DISCONTINUED] cephALEXin (KEFLEX) 500 MG capsule Take 1 capsule (500 mg total) by mouth 2 (two) times daily.   [DISCONTINUED] oxyCODONE (OXY IR/ROXICODONE) 5 MG immediate release tablet Take 1 tablet (5 mg total) by mouth every 4 (four) hours as needed for severe  pain. (Patient not taking: Reported on 04/16/2023)   No facility-administered encounter medications on file as of 04/21/2023.    Allergies  Allergen Reactions   Gabapentin Other (See Comments)    Confusion and somnolence     Review of Systems  Constitutional:  Negative for chills and fever.  HENT:  Negative for congestion.   Respiratory:  Positive for cough. Negative for stridor.        Clear sputum  Cardiovascular:  Negative for chest pain and palpitations.  Neurological:  Negative for dizziness and headaches.     Observations/Objective: No vital signs or physical exam, this was a virtual health encounter.  Pt alert and oriented, answers all questions appropriately, and able to speak in full sentences.    Assessment and Plan: [place] was seen today for cough.  Diagnoses and all orders for this visit:  Cough, unspecified type -     doxycycline (VIBRAMYCIN) 100 MG capsule; Take 1 capsule (100 mg total) by mouth 2 (two) times daily.   Advise to continue Mucinex and increase hydration Doxy sent to her pharmacy, advise her to wait a few, if she doesn'timprove to start it Follow Up Instructions: Return if symptoms worsen or fail to improve, for follow-up.    I discussed the assessment and treatment plan with the patient. The patient was provided an opportunity to ask questions and all were answered. The patient agreed with the plan and demonstrated an understanding of the instructions.   The patient was advised to call back or seek an in-person evaluation if the symptoms worsen or if the condition fails to improve as anticipated.  The above assessment and management plan was discussed with the patient. The patient  verbalized understanding of and has agreed to the management plan. Patient is aware to call the clinic if they develop any new symptoms or if symptoms persist or worsen. Patient is aware when to return to the clinic for a follow-up visit. Patient educated on when it is appropriate to go to the emergency department.    I provided 10 minutes of time during this video encounter.   Arrie Aran Santa Lighter, DNP [facility] [address] [place], [place] [address] [phone] 04/21/2023

## 2023-04-22 ENCOUNTER — Telehealth: Payer: Self-pay | Admitting: Cardiovascular Disease

## 2023-04-22 ENCOUNTER — Telehealth: Payer: Self-pay | Admitting: Nurse Practitioner

## 2023-04-22 DIAGNOSIS — R051 Acute cough: Secondary | ICD-10-CM

## 2023-04-22 MED ORDER — PREDNISONE 20 MG PO TABS
40.0000 mg | ORAL_TABLET | Freq: Every day | ORAL | 0 refills | Status: AC
Start: 2023-04-22 — End: 2023-04-27

## 2023-04-22 MED ORDER — ALBUTEROL SULFATE HFA 108 (90 BASE) MCG/ACT IN AERS
2.0000 | INHALATION_SPRAY | Freq: Four times a day (QID) | RESPIRATORY_TRACT | 0 refills | Status: AC | PRN
Start: 2023-04-22 — End: ?

## 2023-04-22 NOTE — Telephone Encounter (Signed)
  Incoming Patient Call  04/22/2023  What symptoms do you have? Cough congestion  How long have you been sick?about 4 days  Have you been seen for this problem? Yes video visit, was prescribed an antibiotic but she is already taking an antibiotic for UTI, and daughter wants to know what pt can take to loosen up the congestion   If your provider decides to give you a prescription, which pharmacy would you like for it to be sent to[facility]  Patient informed that this information will be sent to the clinical staff for review and that they should receive a follow up call.

## 2023-04-22 NOTE — Telephone Encounter (Signed)
Pt daughter called in stating pt is still wheezing and since her PCP is out of town they want to know if Dr. Duke Salvia can prescribe her something for her wheezing. Please advise.

## 2023-04-22 NOTE — Telephone Encounter (Signed)
Returned call to patient and daughter,   Advised patient that we are unable to prescribe something for non cardiac issues.

## 2023-04-28 ENCOUNTER — Other Ambulatory Visit: Payer: Self-pay | Admitting: Nurse Practitioner

## 2023-04-28 DIAGNOSIS — N3000 Acute cystitis without hematuria: Secondary | ICD-10-CM

## 2023-04-29 ENCOUNTER — Ambulatory Visit: Payer: Medicare HMO | Admitting: Diagnostic Neuroimaging

## 2023-04-29 DIAGNOSIS — R351 Nocturia: Secondary | ICD-10-CM | POA: Diagnosis not present

## 2023-04-29 DIAGNOSIS — N3020 Other chronic cystitis without hematuria: Secondary | ICD-10-CM | POA: Diagnosis not present

## 2023-05-01 MED ORDER — NITROFURANTOIN MONOHYD MACRO 100 MG PO CAPS: 100.0000 mg | ORAL_CAPSULE | Freq: Two times a day (BID) | ORAL | 0 refills | Status: DC

## 2023-05-03 ENCOUNTER — Ambulatory Visit (HOSPITAL_BASED_OUTPATIENT_CLINIC_OR_DEPARTMENT_OTHER): Payer: Medicare HMO

## 2023-05-08 ENCOUNTER — Ambulatory Visit (HOSPITAL_BASED_OUTPATIENT_CLINIC_OR_DEPARTMENT_OTHER)
Admission: RE | Admit: 2023-05-08 | Discharge: 2023-05-08 | Disposition: A | Discharge status: Home / Self Care | Payer: Medicare HMO | Source: Ambulatory Visit | Attending: Cardiovascular Disease | Admitting: Cardiovascular Disease

## 2023-05-08 DIAGNOSIS — I700 Atherosclerosis of aorta: Secondary | ICD-10-CM | POA: Insufficient documentation

## 2023-05-08 DIAGNOSIS — I2510 Atherosclerotic heart disease of native coronary artery without angina pectoris: Secondary | ICD-10-CM | POA: Diagnosis not present

## 2023-05-08 DIAGNOSIS — I4891 Unspecified atrial fibrillation: Secondary | ICD-10-CM | POA: Insufficient documentation

## 2023-05-08 DIAGNOSIS — I10 Essential (primary) hypertension: Secondary | ICD-10-CM | POA: Diagnosis not present

## 2023-05-08 DIAGNOSIS — R911 Solitary pulmonary nodule: Secondary | ICD-10-CM | POA: Diagnosis not present

## 2023-05-08 DIAGNOSIS — Z5181 Encounter for therapeutic drug level monitoring: Secondary | ICD-10-CM | POA: Diagnosis not present

## 2023-05-08 DIAGNOSIS — R918 Other nonspecific abnormal finding of lung field: Secondary | ICD-10-CM | POA: Diagnosis not present

## 2023-05-08 LAB — BASIC METABOLIC PANEL
BUN/Creatinine Ratio: 17 (ref 12–28)
BUN: 11 mg/dL (ref 8–27)
CO2: 22 mmol/L (ref 20–29)
Calcium: 11.3 mg/dL — ABNORMAL HIGH (ref 8.7–10.3)
Chloride: 103 mmol/L (ref 96–106)
Creatinine, Ser: 0.65 mg/dL (ref 0.57–1.00)
Glucose: 116 mg/dL — ABNORMAL HIGH (ref 70–99)
Potassium: 4.5 mmol/L (ref 3.5–5.2)
Sodium: 138 mmol/L (ref 134–144)
eGFR: 87 mL/min/{1.73_m2} (ref >59)

## 2023-05-13 ENCOUNTER — Other Ambulatory Visit: Payer: Self-pay | Admitting: Nurse Practitioner

## 2023-06-03 DIAGNOSIS — S72451D Displaced supracondylar fracture without intracondylar extension of lower end of right femur, subsequent encounter for closed fracture with routine healing: Secondary | ICD-10-CM | POA: Diagnosis not present

## 2023-06-06 ENCOUNTER — Ambulatory Visit (HOSPITAL_BASED_OUTPATIENT_CLINIC_OR_DEPARTMENT_OTHER): Payer: Medicare HMO | Admitting: Family

## 2023-06-10 ENCOUNTER — Other Ambulatory Visit (HOSPITAL_BASED_OUTPATIENT_CLINIC_OR_DEPARTMENT_OTHER): Payer: Self-pay | Admitting: *Deleted

## 2023-06-10 NOTE — Telephone Encounter (Signed)
-----   Message from [facility] sent at 06/08/2023 11:11 AM EST ----- Chest CT shows that you have a lung nodule that is new since 10/2022.  Repeat chest CT 10/2022.  There is also plaque in your coronary arteries and aorta.  Continue aspirin and rosuvastatin.  Triglycerides are too high despite being on rosuvastatin and fenofibrate.  Recommend adding Vascepa which is highly purified fish oil to get her triglycerides better controlled.

## 2023-06-13 ENCOUNTER — Other Ambulatory Visit: Payer: Self-pay | Admitting: Nurse Practitioner

## 2023-06-23 ENCOUNTER — Telehealth: Payer: Self-pay | Admitting: Cardiovascular Disease

## 2023-06-23 ENCOUNTER — Telehealth: Payer: Self-pay | Admitting: Pharmacy Technician

## 2023-06-23 ENCOUNTER — Other Ambulatory Visit (HOSPITAL_COMMUNITY): Payer: Self-pay

## 2023-06-23 MED ORDER — ICOSAPENT ETHYL 1 G PO CAPS: 2.0000 g | ORAL_CAPSULE | Freq: Two times a day (BID) | ORAL | 5 refills | Status: DC

## 2023-06-23 NOTE — Telephone Encounter (Signed)
Pharmacy Patient Advocate Encounter   Received notification from Pt Calls Messages that prior authorization for Vascepa [facility]) is required/requested.   Insurance verification completed.   The patient is insured through [employer] .   Per test claim: The current 06/23/23 day co-pay is, $45.00 one month.  No PA needed at this time. This test claim was processed through [facility]- copay amounts may vary at other pharmacies due to pharmacy/plan contracts, or as the patient moves through the different stages of their insurance plan.

## 2023-06-23 NOTE — Telephone Encounter (Signed)
Daughter is calling in about that fish oil pill that suppose to be called in for the patient. Please advise

## 2023-06-23 NOTE — Telephone Encounter (Signed)
Discussed with daughter and sent Vascepa 1 mg 2 tablets twice a day to pharmacy

## 2023-06-23 NOTE — Telephone Encounter (Signed)
06/10/23 Vascepa started based on coronary calcification noted on CT. Unclear whether PA obtained yet. Will route to PA team.   Alver Sorrow, NP

## 2023-06-23 NOTE — Telephone Encounter (Signed)
See phone note 12/23

## 2023-06-27 ENCOUNTER — Other Ambulatory Visit: Payer: Self-pay | Admitting: Nurse Practitioner

## 2023-07-01 DIAGNOSIS — L84 Corns and callosities: Secondary | ICD-10-CM | POA: Diagnosis not present

## 2023-07-01 DIAGNOSIS — B351 Tinea unguium: Secondary | ICD-10-CM | POA: Diagnosis not present

## 2023-07-01 DIAGNOSIS — M79676 Pain in unspecified toe(s): Secondary | ICD-10-CM | POA: Diagnosis not present

## 2023-07-01 DIAGNOSIS — E1151 Type 2 diabetes mellitus with diabetic peripheral angiopathy without gangrene: Secondary | ICD-10-CM | POA: Diagnosis not present

## 2023-07-03 ENCOUNTER — Telehealth (HOSPITAL_BASED_OUTPATIENT_CLINIC_OR_DEPARTMENT_OTHER): Payer: Self-pay

## 2023-07-03 DIAGNOSIS — Z5181 Encounter for therapeutic drug level monitoring: Secondary | ICD-10-CM

## 2023-07-03 MED ORDER — HYDROCHLOROTHIAZIDE 12.5 MG PO TABS: 12.5000 mg | ORAL_TABLET | Freq: Every day | ORAL | Status: DC

## 2023-07-03 NOTE — Telephone Encounter (Signed)
 Pt rescheduled for 07/21/23 @ 2:20p. Appointment made with daughter. (Ok per dpr)

## 2023-07-03 NOTE — Telephone Encounter (Signed)
-----   Message from Reche GORMAN Finder sent at 07/02/2023  4:44 PM EST ----- Stable kidney function. Calcium  elevated possibly due to hydrochlorothiazide . Reduce to half tablet daily and repeat BMP in 1-2 weeks.   Seen 04/21/23 and recommended for 6 week follow up. Presently scheduled for 08/2023 which is too far out. Please assist to reschedule follow up with Dr. Raford or Reche GORMAN Finder, NP (okay to use TOC slot if needed)

## 2023-07-09 DIAGNOSIS — N3020 Other chronic cystitis without hematuria: Secondary | ICD-10-CM | POA: Diagnosis not present

## 2023-07-09 DIAGNOSIS — N200 Calculus of kidney: Secondary | ICD-10-CM | POA: Diagnosis not present

## 2023-07-09 DIAGNOSIS — N952 Postmenopausal atrophic vaginitis: Secondary | ICD-10-CM | POA: Diagnosis not present

## 2023-07-21 ENCOUNTER — Ambulatory Visit (HOSPITAL_BASED_OUTPATIENT_CLINIC_OR_DEPARTMENT_OTHER): Payer: Medicare HMO | Admitting: Family

## 2023-07-21 ENCOUNTER — Encounter (HOSPITAL_BASED_OUTPATIENT_CLINIC_OR_DEPARTMENT_OTHER): Payer: Self-pay | Admitting: Family

## 2023-07-21 VITALS — BP 134/74 | HR 77 | Ht 70.5 in | Wt 187.0 lb

## 2023-07-21 DIAGNOSIS — E785 Hyperlipidemia, unspecified: Secondary | ICD-10-CM | POA: Diagnosis not present

## 2023-07-21 DIAGNOSIS — R918 Other nonspecific abnormal finding of lung field: Secondary | ICD-10-CM

## 2023-07-21 DIAGNOSIS — I2510 Atherosclerotic heart disease of native coronary artery without angina pectoris: Secondary | ICD-10-CM

## 2023-07-21 DIAGNOSIS — I10 Essential (primary) hypertension: Secondary | ICD-10-CM | POA: Diagnosis not present

## 2023-07-21 DIAGNOSIS — I700 Atherosclerosis of aorta: Secondary | ICD-10-CM | POA: Diagnosis not present

## 2023-07-21 DIAGNOSIS — Z5181 Encounter for therapeutic drug level monitoring: Secondary | ICD-10-CM | POA: Diagnosis not present

## 2023-07-21 LAB — BASIC METABOLIC PANEL
BUN/Creatinine Ratio: 23 (ref 12–28)
BUN: 22 mg/dL (ref 8–27)
CO2: 25 mmol/L (ref 20–29)
Calcium: 11.2 mg/dL — ABNORMAL HIGH (ref 8.7–10.3)
Chloride: 103 mmol/L (ref 96–106)
Creatinine, Ser: 0.95 mg/dL (ref 0.57–1.00)
Glucose: 96 mg/dL (ref 70–99)
Potassium: 4.9 mmol/L (ref 3.5–5.2)
Sodium: 139 mmol/L (ref 134–144)
eGFR: 59 mL/min/{1.73_m2} — ABNORMAL LOW (ref >59)

## 2023-07-21 NOTE — Patient Instructions (Signed)
Medication Instructions:  Your physician recommends that you continue on your current medications as directed. Please refer to the Current Medication list given to you today.  *If you need a refill on your cardiac medications before your next appointment, please call your pharmacy*   Lab Work: Your physician recommends that you return for lab work today: BMP   If you have labs (blood work) drawn today and your tests are completely normal, you will receive your results only by: MyChart Message (if you have MyChart) OR A paper copy in the mail If you have any lab test that is abnormal or we need to change your treatment, we will call you to review the results.  Follow-Up: At [facility], you and your health needs are our priority.  As part of our continuing mission to provide you with exceptional heart care, we have created designated Provider Care Teams.  These Care Teams include your primary Cardiologist (physician) and Advanced Practice Providers (APPs -  Physician Assistants and Nurse Practitioners) who all work together to provide you with the care you need, when you need it.  We recommend signing up for the patient portal called "MyChart".  Sign up information is provided on this After Visit Summary.  MyChart is used to connect with patients for Virtual Visits (Telemedicine).  Patients are able to view lab/test results, encounter notes, upcoming appointments, etc.  Non-urgent messages can be sent to your provider as well.   To learn more about what you can do with MyChart, go to [email or web address].    Your next appointment:   6 month(s)  Provider:   Chilton Si, MD    Other Instructions

## 2023-07-21 NOTE — Progress Notes (Unsigned)
Cardiology Office Note:  .   Date:  07/24/2023  ID:  HAWI BUTLER, DOB 1936-01-27, MRN [id number] PCP: Bennie Pierini, FNP  [facility] Providers Cardiologist:  Chilton Si, MD    History of Present Illness: Caitlin Mccarthy   Caitlin Mccarthy is a 88 y.o. female HTN, CVA, coronary calcficiation on CT, aortic atherosclerosis, HLD, COPD, DM2, GERD, OA  ER 04/16/23 with AMS, dysarthria, slurred speech. MRI no stroke. Similar episode at time of UTI.   Last seen 04/21/23 with elevated BP. Hydrochlorothiazide was added. 30-day monitor due to recent episodes of confusion. Due to hazy area on lung on CXR with subsequent CT ground glass nodule LUL 6mm new since 11/03/22. Recommended for repeat CT in 6-12 mos. Inadvertent finding of coronary and aortic atherosclerosis. Vascepa initiated for elevated triglycerides.   Elevated calcium on hydrochlorothiazide, recommended to reduce to half tablet via phone call.  Presents today for follow up. Chief complaint of knee pain which limits activity and has been ongoing. Reports her blood pressure has been well controlled in the 130s. 130s/70-80s. Will get lightheaded if she moves too quickly. No recurrent episodes as she had in October. Did not wear heart monitor as no recurrent symptoms and planning to return.   ROS: Please see the history of present illness.    All other systems reviewed and are negative.   Studies Reviewed: .        Cardiac Studies & Procedures      ECHOCARDIOGRAM  ECHOCARDIOGRAM COMPLETE 11/03/2022  Narrative ECHOCARDIOGRAM REPORT    Patient Name:   Caitlin Mccarthy Date of Exam: 11/03/2022 Medical Rec #:  [id number]         Height:       70.0 in Accession #:    [id number]        Weight:       198.2 lb Date of Birth:  1936/05/21        BSA:          2.079 m Patient Age:    86 years          BP:           144/52 mmHg Patient Gender: F                 HR:           73 bpm. [facility]  Procedure: 2D  Echo, Color Doppler and Cardiac Doppler  Indications:    I50.21 Acute systolic (congestive) heart failure  History:        Patient has prior history of Echocardiogram examinations, most recent 01/10/2017. COPD; Risk Factors:Hypertension, Diabetes and Dyslipidemia.  Sonographer:    Irving Burton Senior RDCS Referring Phys: [phone] JACOB J STINSON   Sonographer Comments: Technically difficult due to COPD IMPRESSIONS   1. Left ventricular ejection fraction, by estimation, is >75%. The left ventricle has hyperdynamic function. The left ventricle has no regional wall motion abnormalities. Left ventricular diastolic parameters are indeterminate. 2. Right ventricular systolic function is normal. The right ventricular size is normal. Tricuspid regurgitation signal is inadequate for assessing PA pressure. 3. No obvious SAM (systolic anterior motion of mitral valve). The mitral valve is normal in structure. Trivial mitral valve regurgitation. No evidence of mitral stenosis. 4. The aortic valve is normal in structure. Aortic valve regurgitation is not visualized. No aortic stenosis is present. 5. The inferior vena cava is dilated in size with >50% respiratory variability, suggesting right atrial pressure of 8 mmHg.  FINDINGS Left Ventricle: Left ventricular ejection fraction, by estimation, is >75%. The left ventricle has hyperdynamic function. The left ventricle has no regional wall motion abnormalities. The left ventricular internal cavity size was normal in size. There is no left ventricular hypertrophy. Left ventricular diastolic parameters are indeterminate.  Right Ventricle: The right ventricular size is normal. No increase in right ventricular wall thickness. Right ventricular systolic function is normal. Tricuspid regurgitation signal is inadequate for assessing PA pressure.  Left Atrium: Left atrial size was normal in size.  Right Atrium: Right atrial size was normal in size.  Pericardium: There is  no evidence of pericardial effusion. Presence of epicardial fat layer.  Mitral Valve: No obvious SAM (systolic anterior motion of mitral valve). The mitral valve is normal in structure. Trivial mitral valve regurgitation. No evidence of mitral valve stenosis.  Tricuspid Valve: The tricuspid valve is normal in structure. Tricuspid valve regurgitation is not demonstrated. No evidence of tricuspid stenosis.  Aortic Valve: The aortic valve is normal in structure. Aortic valve regurgitation is not visualized. No aortic stenosis is present.  Pulmonic Valve: The pulmonic valve was normal in structure. Pulmonic valve regurgitation is not visualized. No evidence of pulmonic stenosis.  Aorta: The aortic root is normal in size and structure.  Venous: The inferior vena cava is dilated in size with greater than 50% respiratory variability, suggesting right atrial pressure of 8 mmHg.  IAS/Shunts: No atrial level shunt detected by color flow Doppler.   LEFT VENTRICLE PLAX 2D LVIDd:         4.20 cm LVIDs:         2.70 cm LV PW:         1.00 cm LV IVS:        1.00 cm LVOT diam:     2.10 cm LV SV:         89 LV SV Index:   43 LVOT Area:     3.46 cm   RIGHT VENTRICLE RV S prime:     10.69 cm/s TAPSE (M-mode): 1.8 cm  LEFT ATRIUM             Index        RIGHT ATRIUM           Index LA diam:        3.60 cm 1.73 cm/m   RA Area:     16.50 cm LA Vol (A2C):   45.2 ml 21.74 ml/m  RA Volume:   40.50 ml  19.48 ml/m LA Vol (A4C):   53.2 ml 25.59 ml/m LA Biplane Vol: 51.4 ml 24.72 ml/m AORTIC VALVE LVOT Vmax:   118.33 cm/s LVOT Vmean:  86.667 cm/s LVOT VTI:    0.256 m  AORTA Ao Root diam: 3.20 cm Ao Asc diam:  3.50 cm   SHUNTS Systemic VTI:  0.26 m Systemic Diam: 2.10 cm  Donato Schultz MD Electronically signed by Donato Schultz MD Signature Date/Time: 11/03/2022/11:28:46 AM    Final   MONITORS  CARDIAC EVENT MONITOR 02/18/2017  Narrative  Sinus rhythm with isolated PAC's.  No  symptoms were reported.           Risk Assessment/Calculations:            Physical Exam:   VS:  BP 134/74 (Cuff Size: Large)   Pulse 77   Ht 5' 10.5" (1.791 m)   Wt 187 lb (84.8 kg)   SpO2 95%   BMI 26.45 kg/m    Wt Readings from Last 3  Encounters:  07/21/23 187 lb (84.8 kg)  04/21/23 191 lb (86.6 kg)  04/16/23 185 lb (83.9 kg)    GEN: Well nourished, well developed in no acute distress NECK: No JVD; No carotid bruits CARDIAC: RRR, no murmurs, rubs, gallops RESPIRATORY:  Clear to auscultation without rales, wheezing or rhonchi  ABDOMEN: Soft, non-tender, non-distended EXTREMITIES:  No edema; No deformity   ASSESSMENT AND PLAN: .    Ground glass opacity LUL - CT 05/08/23 "Ground-glass nodule within the left upper lobe measures 6 mm. This is new since CT dated 11/03/2022". Per recommendations, repeat CT in 6 mos.   HTN / Hypercalcemia- BP well controlled. Continue current antihypertensive regimen.  Discussed to monitor BP at home at least 2 hours after medications and sitting for 5-10 minutes. Had hypercalcemia on higher dose hydrochlorothiazide, repeat BMP for monitoring since reduced dose.   Coronary calcification on CT / aortic atherosclerosis / HLD - Stable with no anginal symptoms. No indication for ischemic evaluation.  GDMT aspirin, Vascepa, Rosuvastatin.   Hx of CVA - No recurrent symptoms. Prior loop recorder no longer active. As her symptoms from October have not recurred and she has not worn monitor, plans to return. If she has recurrent symptoms, could re-order. Prior episode PAF were fleeting and did not require OAC. Continue Aspirin, Vascepa, Rosuvastatin.        Dispo: follow up in 6 mos  Signed, Alver Sorrow, NP

## 2023-07-22 ENCOUNTER — Encounter (HOSPITAL_BASED_OUTPATIENT_CLINIC_OR_DEPARTMENT_OTHER): Payer: Self-pay

## 2023-07-24 ENCOUNTER — Encounter (HOSPITAL_BASED_OUTPATIENT_CLINIC_OR_DEPARTMENT_OTHER): Payer: Self-pay | Admitting: Family

## 2023-07-28 ENCOUNTER — Ambulatory Visit: Payer: Self-pay | Admitting: Nurse Practitioner

## 2023-07-28 MED ORDER — OSELTAMIVIR PHOSPHATE 75 MG PO CAPS: 75.0000 mg | ORAL_CAPSULE | Freq: Two times a day (BID) | ORAL | 0 refills | Status: DC

## 2023-07-28 NOTE — Telephone Encounter (Signed)
Copied from CRM [id number]. Topic: Clinical - Red Word Triage >> Jul 28, 2023  3:05 PM Fuller Mandril wrote: Red Word that prompted transfer to Nurse Triage: Severely fatigued, running nose, possible flu   Chief Complaint: Flu exposure/symptoms Symptoms: Runny nose, fatigue  Frequency: Constant  Pertinent Negatives: Patient denies fever or shortness of breath  Disposition: [] ED /[] Urgent Care (no appt availability in office) / [x] Appointment(In office/virtual)/ []  [facility]/ [] Home Care/ [] Refused Recommended Disposition /[] [place] [facility]/ []  Follow-up with PCP Additional Notes: Patient's daughter Lanice Schwab called about something for her mother for the flu. Misty states that she had the flu recently and saw her mother on 3 days ago on Friday. She states that yesterday her mother began to experience fatigue and a runny nose and would like something to help with her symptoms. Virtual appointment made for tomorrow. Patient's daughter instructed to call back for new or worsening symptoms and when to seek treatment at urgent care or the ED. She verbalized understanding and agreement with this plan.     Reason for Disposition  Patient is HIGH RISK (e.g., age > 64 years, pregnant, HIV+, or chronic medical condition)  Answer Assessment - Initial Assessment Questions 1. TYPE of EXPOSURE: "How were you exposed?" (e.g., close contact, not a close contact)     Close contact  2. DATE of EXPOSURE: "When did the exposure occur?" (e.g., hour, days, weeks)     3 days ago 3. PREGNANCY: "Is there any chance you are pregnant?" "When was your last menstrual period?"     No 4. HIGH RISK for COMPLICATIONS: "Do you have any heart or lung problems?" "Do you have a weakened immune system?" (e.g., CHF, COPD, asthma, HIV positive, chemotherapy, renal failure, diabetes mellitus, sickle cell anemia)     Heart failure, diabetes, COPD 5. SYMPTOMS: "Do you have any symptoms?" (e.g., cough, fever, sore throat,  difficulty breathing).     Runny nose, fatigued  Answer Assessment - Initial Assessment Questions 1. WORST SYMPTOM: "What is your worst symptom?" (e.g., cough, runny nose, muscle aches, headache, sore throat, fever)      Fatigue  2. ONSET: "When did your flu symptoms start?"      Yesterday  3. COUGH: "How bad is the cough?"       No 4. RESPIRATORY DISTRESS: "Describe your breathing."      No shortness of breath  5. FEVER: "Do you have a fever?" If Yes, ask: "What is your temperature, how was it measured, and when did it start?"     No 6. EXPOSURE: "Were you exposed to someone with influenza?"       3 days ago 7. FLU VACCINE: "Did you get a flu shot this year?"     Yes 8. HIGH RISK DISEASE: "Do you have any chronic medical problems?" (e.g., heart or lung disease, asthma, weak immune system, or other HIGH RISK conditions)     COPD, diabetes 9. PREGNANCY: "Is there any chance you are pregnant?" "When was your last menstrual period?"     No 10. OTHER SYMPTOMS: "Do you have any other symptoms?"  (e.g., runny nose, muscle aches, headache, sore throat)       Runny nose  Protocols used: Influenza (Flu) Exposure-A-AH, Influenza (Flu) - Seasonal-A-AH

## 2023-07-29 ENCOUNTER — Telehealth (INDEPENDENT_AMBULATORY_CARE_PROVIDER_SITE_OTHER): Payer: Medicare HMO | Admitting: Nurse Practitioner

## 2023-07-29 ENCOUNTER — Encounter: Payer: Self-pay | Admitting: Nurse Practitioner

## 2023-07-29 DIAGNOSIS — Z9189 Other specified personal risk factors, not elsewhere classified: Secondary | ICD-10-CM | POA: Diagnosis not present

## 2023-07-29 NOTE — Patient Instructions (Signed)
1. Take meds as prescribed 2. Use a cool mist humidifier especially during the winter months and when heat has been humid. 3. Use saline nose sprays frequently 4. Saline irrigations of the nose can be very helpful if done frequently.  * 4X daily for 1 week*  * Use of a nettie pot can be helpful with this. Follow directions with this* 5. Drink plenty of fluids 6. Keep thermostat turn down low 7.For any cough or congestion- robitussin or mucinex 8. For fever or aces or pains- take tylenol or ibuprofen appropriate for age and weight.  * for fevers greater than 101 orally you may alternate ibuprofen and tylenol every  3 hours.

## 2023-07-29 NOTE — Progress Notes (Signed)
Virtual Visit Consent   Caitlin Mccarthy, you are scheduled for a virtual visit with Mary-Margaret Daphine Deutscher, FNP, a [facility] provider, today.     Just as with appointments in the office, your consent must be obtained to participate.  Your consent will be active for this visit and any virtual visit you may have with one of our providers in the next 365 days.     If you have a MyChart account, a copy of this consent can be sent to you electronically.  All virtual visits are billed to your insurance company just like a traditional visit in the office.    As this is a virtual visit, video technology does not allow for your provider to perform a traditional examination.  This may limit your provider's ability to fully assess your condition.  If your provider identifies any concerns that need to be evaluated in person or the need to arrange testing (such as labs, EKG, etc.), we will make arrangements to do so.     Although advances in technology are sophisticated, we cannot ensure that it will always work on either your end or our end.  If the connection with a video visit is poor, the visit may have to be switched to a telephone visit.  With either a video or telephone visit, we are not always able to ensure that we have a secure connection.     I need to obtain your verbal consent now.   Are you willing to proceed with your visit today? YES   Caitlin Mccarthy has provided verbal consent on 07/29/2023 for a virtual visit (video or telephone).   Mary-Margaret Daphine Deutscher, FNP   Date: 07/29/2023 12:41 PM   Virtual Visit via Video Note   I, Mary-Margaret Daphine Deutscher, connected with Caitlin Mccarthy ([id number], 1936/06/01) on 07/29/23 at 12:30 PM EST by a video-enabled telemedicine application and verified that I am speaking with the correct person using two identifiers.  Location: Patient: Virtual Visit Location Patient: Home Provider: Virtual Visit Location Provider: Mobile   I discussed the  limitations of evaluation and management by telemedicine and the availability of in person appointments. The patient expressed understanding and agreed to proceed.    History of Present Illness: Caitlin Mccarthy is a 88 y.o. who identifies as a female who was assigned female at birth, and is being seen today for flu exposure .  HPI: Daughter had flu last week and now she has bnody aches, cough and congestion. No fever. Very weak with poor appetite    Review of Systems  Constitutional:  Positive for chills and malaise/fatigue. Negative for fever.  HENT:  Positive for congestion.   Respiratory:  Positive for cough and sputum production.   Neurological:  Positive for dizziness.    Problems:  Patient Active Problem List   Diagnosis Date Noted   PAF (paroxysmal atrial fibrillation) (HCC) 04/21/2023   Cough 04/21/2023   Fall at home, initial encounter 11/27/2022   Chronic respiratory failure with hypoxia (HCC) 11/27/2022   Femur fracture, right (HCC) 11/26/2022   Acute clinical systolic heart failure (HCC) 11/02/2022   Overactive bladder 10/25/2019   Closed fracture of fourth metatarsal bone 01/05/2019   Closed fracture of third metatarsal bone 01/05/2019   Pain management contract signed 06/11/2018   History of right hip replacement 12/16/2017   Vocal cord nodule 01/11/2017   Late effect of cerebrovascular accident (CVA) 01/10/2017   OA (osteoarthritis) 01/09/2017   History of kidney stones 01/09/2017  COPD (chronic obstructive pulmonary disease) (HCC) 11/09/2016   Hyperlipidemia due to type 2 diabetes mellitus (HCC) 11/09/2016   Glaucoma 05/10/2016   Vitamin D deficiency 05/10/2016   Current smoker 05/10/2016   Osteoarthritis of both hands 05/10/2016   History of total right hip replacement 05/10/2016   Osteoporosis 05/09/2016   Diabetes mellitus treated with oral medication (HCC) 02/05/2016   Overweight (BMI 25.0-29.9) 12/28/2015   GAD (generalized anxiety disorder)  12/28/2015   Hypokalemia 06/02/2015   Chronic pain 04/13/2015   Essential hypertension, benign 10/02/2012   GERD (gastroesophageal reflux disease) 10/02/2012   Depression 10/02/2012   OA (osteoarthritis) of hip 10/28/2011    Allergies:  Allergies  Allergen Reactions   Gabapentin Other (See Comments)    Confusion and somnolence    Medications:  Current Outpatient Medications:    acetaminophen (TYLENOL) 650 MG CR tablet, Take 650 mg by mouth every 8 (eight) hours as needed for pain., Disp: , Rfl:    albuterol (VENTOLIN HFA) 108 (90 Base) MCG/ACT inhaler, Inhale 2 puffs into the lungs every 6 (six) hours as needed for wheezing or shortness of breath., Disp: 8 g, Rfl: 0   Alcohol Swabs (B-D SINGLE USE SWABS REGULAR) PADS, Test BS daily and as needed Dx E11.65, Disp: 100 each, Rfl: 3   amLODipine-benazepril (LOTREL) 10-40 MG capsule, Take 1 capsule by mouth daily., Disp: 90 capsule, Rfl: 1   aspirin EC 81 MG tablet, Take 1 tablet (81 mg total) by mouth daily., Disp: 30 tablet, Rfl: 11   baclofen (LIORESAL) 10 MG tablet, Take 1 tablet (10 mg total) by mouth 3 (three) times daily., Disp: 30 each, Rfl: 0   Blood Glucose Calibration (TRUE METRIX LEVEL 1) Low SOLN, Use with glucose monitor Dx E11.65, Disp: 3 each, Rfl: 0   Blood Glucose Monitoring Suppl (TRUE METRIX AIR GLUCOSE METER) w/Device KIT, Test BS daily and as needed Dx E11.65, Disp: 1 kit, Rfl: 0   citalopram (CELEXA) 20 MG tablet, Take 1 tablet (20 mg total) by mouth daily., Disp: 30 tablet, Rfl: 5   fenofibrate (TRICOR) 145 MG tablet, Take 1 tablet (145 mg total) by mouth daily., Disp: 90 tablet, Rfl: 1   glucose blood (TRUE METRIX BLOOD GLUCOSE TEST) test strip, Test BS daily and as needed Dx E11.65, Disp: 100 each, Rfl: 3   hydrochlorothiazide (HYDRODIURIL) 12.5 MG tablet, Take 1 tablet (12.5 mg total) by mouth daily., Disp: , Rfl:    ibuprofen (ADVIL) 200 MG tablet, Take 200 mg by mouth every 6 (six) hours as needed., Disp: , Rfl:     icosapent Ethyl (VASCEPA) 1 g capsule, Take 2 capsules (2 g total) by mouth 2 (two) times daily., Disp: 120 capsule, Rfl: 5   LORazepam (ATIVAN) 0.5 MG tablet, Take 1 tablet (0.5 mg total) by mouth at bedtime. for anxiety, Disp: 30 tablet, Rfl: 5   metFORMIN (GLUCOPHAGE-XR) 500 MG 24 hr tablet, Take 1 tablet (500 mg total) by mouth 2 (two) times daily with a meal., Disp: 180 tablet, Rfl: 1   nitrofurantoin, macrocrystal-monohydrate, (MACROBID) 100 MG capsule, Take 1 capsule (100 mg total) by mouth 2 (two) times daily. X 7 days, Disp: 14 capsule, Rfl: 0   nystatin (MYCOSTATIN/NYSTOP) powder, APPLY TOPICALLY FOUR TIMES DAILY (Patient taking differently: Apply 1 Application topically 4 (four) times daily.), Disp: 60 g, Rfl: 0   ondansetron (ZOFRAN-ODT) 4 MG disintegrating tablet, TAKE 1 TABLET EVERY 8 HOURS AS NEEDED FOR NAUSEA AND VOMITING, Disp: 20 tablet, Rfl: 3  oseltamivir (TAMIFLU) 75 MG capsule, Take 1 capsule (75 mg total) by mouth 2 (two) times daily., Disp: 10 capsule, Rfl: 0   pantoprazole (PROTONIX) 40 MG tablet, TAKE ONE (1) TABLET BY MOUTH EVERY DAY, Disp: 30 tablet, Rfl: 2   polyethylene glycol (MIRALAX / GLYCOLAX) 17 g packet, Take 17 g by mouth daily., Disp: , Rfl:    PREMARIN vaginal cream, Apply 1 application topically daily as needed (irritation). , Disp: , Rfl:    rosuvastatin (CRESTOR) 40 MG tablet, TAKE ONE (1) TABLET EACH DAY, Disp: 90 tablet, Rfl: 1   traMADol (ULTRAM) 50 MG tablet, Take 1 tablet (50 mg total) by mouth every 12 (twelve) hours as needed., Disp: 60 tablet, Rfl: 0   TRUEplus Lancets 33G MISC, Test BS daily and as needed Dx E11.65, Disp: 100 each, Rfl: 0   Vitamin D, Ergocalciferol, (DRISDOL) 1.25 MG (50000 UNIT) CAPS capsule, Take 1 capsule (50,000 Units total) by mouth every 7 (seven) days., Disp: 5 capsule, Rfl: 0  Observations/Objective: Patient is well-developed, well-nourished in no acute distress.  Resting comfortably  at home.  Head is normocephalic,  atraumatic.  No labored breathing.  Speech is clear and coherent with logical content.  Patient is alert and oriented at baseline.  Deep wet cough  Assessment and Plan:  Caitlin Mccarthy in today with chief complaint of No chief complaint on file.   1. At increased risk for exposure to influenza virus (Primary)  1. Take meds as prescribed 2. Use a cool mist humidifier especially during the winter months and when heat has been humid. 3. Use saline nose sprays frequently 4. Saline irrigations of the nose can be very helpful if Mccarthy frequently.  * 4X daily for 1 week*  * Use of a nettie pot can be helpful with this. Follow directions with this* 5. Drink plenty of fluids 6. Keep thermostat turn down low 7.For any cough or congestion- robitussin 8. For fever or aces or pains- take tylenol or ibuprofen appropriate for age and weight.  * for fevers greater than 101 orally you may alternate ibuprofen and tylenol every  3 hours.   Take tamiflu as prescribed yesterday  Follow Up Instructions: I discussed the assessment and treatment plan with the patient. The patient was provided an opportunity to ask questions and all were answered. The patient agreed with the plan and demonstrated an understanding of the instructions.  A copy of instructions were sent to the patient via MyChart.  The patient was advised to call back or seek an in-person evaluation if the symptoms worsen or if the condition fails to improve as anticipated.  Time:  I spent 7 minutes with the patient via telehealth technology discussing the above problems/concerns.    Mary-Margaret Daphine Deutscher, FNP

## 2023-08-13 ENCOUNTER — Ambulatory Visit: Payer: Medicare HMO | Admitting: Diagnostic Neuroimaging

## 2023-08-18 ENCOUNTER — Telehealth: Payer: Self-pay | Admitting: *Deleted

## 2023-08-18 DIAGNOSIS — Z85828 Personal history of other malignant neoplasm of skin: Secondary | ICD-10-CM | POA: Diagnosis not present

## 2023-08-18 DIAGNOSIS — D485 Neoplasm of uncertain behavior of skin: Secondary | ICD-10-CM | POA: Diagnosis not present

## 2023-08-18 DIAGNOSIS — C44722 Squamous cell carcinoma of skin of right lower limb, including hip: Secondary | ICD-10-CM | POA: Diagnosis not present

## 2023-08-18 NOTE — Progress Notes (Signed)
 Complex Care Management Note Care Guide Note  08/18/2023 Name: CEILI BOSHERS MRN: [id number] DOB: 15-Jun-1936   Complex Care Management Outreach Attempts: An unsuccessful telephone outreach was attempted today to offer the patient information about available complex care management services.  Follow Up Plan:  Additional outreach attempts will be made to offer the patient complex care management information and services.   Encounter Outcome:  No Answer  [name]  [facility]  [facility], [facility] Guide  Direct Dial: [phone]  Fax [phone]

## 2023-09-01 NOTE — Progress Notes (Signed)
 Complex Care Management Note Care Guide Note  09/01/2023 Name: Caitlin Mccarthy MRN: [id number] DOB: 10-17-1935   Complex Care Management Outreach Attempts: A second unsuccessful outreach was attempted today to offer the patient with information about available complex care management services.  Follow Up Plan:  Additional outreach attempts will be made to offer the patient complex care management information and services.   Encounter Outcome:  No Answer  [name]  [facility]  [facility], [facility] Guide  Direct Dial: [phone]  Fax [phone]

## 2023-09-01 NOTE — Progress Notes (Signed)
 Complex Care Management Note  Care Guide Note 09/01/2023 Name: JOELYNN DUST MRN: [id number] DOB: June 10, 1936  DAIL MEECE is a 88 y.o. year old female who sees Bennie Pierini, FNP for primary care. I reached out to [name] by phone today to offer complex care management services.  Ms. Nier was given information about Complex Care Management services today including:   The Complex Care Management services include support from the care team which includes your Nurse Care Manager, Clinical Social Worker, or Pharmacist.  The Complex Care Management team is here to help remove barriers to the health concerns and goals most important to you. Complex Care Management services are voluntary, and the patient may decline or stop services at any time by request to their care team member.   Complex Care Management Consent Status: Patient agreed to services and verbal consent obtained.   Follow up plan:  Telephone appointment with complex care management team member scheduled for:  09/02/23  Encounter Outcome:  Patient Scheduled  [name]  [facility]  [facility] Guide  Direct Dial: [phone]  Fax [phone]

## 2023-09-02 ENCOUNTER — Encounter: Payer: Self-pay | Admitting: *Deleted

## 2023-09-05 ENCOUNTER — Ambulatory Visit: Payer: Self-pay | Admitting: *Deleted

## 2023-09-05 NOTE — Patient Outreach (Signed)
 Care Coordination   09/05/2023 Name: Caitlin Mccarthy MRN: [id number] DOB: [birth year]   Care Coordination Outreach Attempts:  An unsuccessful outreach was attempted for an appointment today.  Follow Up Plan:  Additional outreach attempts will be made to offer the patient complex care management information and services.   Encounter Outcome:  No Answer   Care Coordination Interventions:  No, not indicated    Espyn Radwan L. Noelle Penner, RN, BSN, CCM [place]  [facility], [facility] RN Care Manager Direct Dial: [phone]  Fax: [phone]

## 2023-09-09 ENCOUNTER — Ambulatory Visit: Payer: Medicare HMO

## 2023-09-09 ENCOUNTER — Ambulatory Visit (HOSPITAL_BASED_OUTPATIENT_CLINIC_OR_DEPARTMENT_OTHER): Payer: Medicare HMO | Admitting: Family

## 2023-09-09 ENCOUNTER — Encounter: Payer: Self-pay | Admitting: Nurse Practitioner

## 2023-09-09 ENCOUNTER — Ambulatory Visit (INDEPENDENT_AMBULATORY_CARE_PROVIDER_SITE_OTHER): Payer: Medicare HMO | Admitting: Nurse Practitioner

## 2023-09-09 ENCOUNTER — Telehealth: Payer: Self-pay | Admitting: *Deleted

## 2023-09-09 VITALS — BP 143/79 | HR 78 | Temp 97.0°F | Ht 70.0 in | Wt 186.0 lb

## 2023-09-09 DIAGNOSIS — Z0001 Encounter for general adult medical examination with abnormal findings: Secondary | ICD-10-CM | POA: Diagnosis not present

## 2023-09-09 DIAGNOSIS — F339 Major depressive disorder, recurrent, unspecified: Secondary | ICD-10-CM

## 2023-09-09 DIAGNOSIS — Z7984 Long term (current) use of oral hypoglycemic drugs: Secondary | ICD-10-CM | POA: Diagnosis not present

## 2023-09-09 DIAGNOSIS — Z8673 Personal history of transient ischemic attack (TIA), and cerebral infarction without residual deficits: Secondary | ICD-10-CM

## 2023-09-09 DIAGNOSIS — E119 Type 2 diabetes mellitus without complications: Secondary | ICD-10-CM | POA: Diagnosis not present

## 2023-09-09 DIAGNOSIS — E876 Hypokalemia: Secondary | ICD-10-CM

## 2023-09-09 DIAGNOSIS — F411 Generalized anxiety disorder: Secondary | ICD-10-CM | POA: Diagnosis not present

## 2023-09-09 DIAGNOSIS — M818 Other osteoporosis without current pathological fracture: Secondary | ICD-10-CM

## 2023-09-09 DIAGNOSIS — J410 Simple chronic bronchitis: Secondary | ICD-10-CM | POA: Diagnosis not present

## 2023-09-09 DIAGNOSIS — K219 Gastro-esophageal reflux disease without esophagitis: Secondary | ICD-10-CM

## 2023-09-09 DIAGNOSIS — I10 Essential (primary) hypertension: Secondary | ICD-10-CM | POA: Diagnosis not present

## 2023-09-09 DIAGNOSIS — Z0000 Encounter for general adult medical examination without abnormal findings: Secondary | ICD-10-CM | POA: Diagnosis not present

## 2023-09-09 DIAGNOSIS — E663 Overweight: Secondary | ICD-10-CM

## 2023-09-09 DIAGNOSIS — E1169 Type 2 diabetes mellitus with other specified complication: Secondary | ICD-10-CM

## 2023-09-09 DIAGNOSIS — E785 Hyperlipidemia, unspecified: Secondary | ICD-10-CM | POA: Diagnosis not present

## 2023-09-09 DIAGNOSIS — J9611 Chronic respiratory failure with hypoxia: Secondary | ICD-10-CM | POA: Diagnosis not present

## 2023-09-09 LAB — BAYER DCA HB A1C WAIVED: HB A1C (BAYER DCA - WAIVED): 5.8 % — ABNORMAL HIGH (ref 4.8–5.6)

## 2023-09-09 MED ORDER — FENOFIBRATE 145 MG PO TABS: 145.0000 mg | ORAL_TABLET | Freq: Every day | ORAL | 1 refills | Status: DC

## 2023-09-09 MED ORDER — METFORMIN HCL ER 500 MG PO TB24
500.0000 mg | ORAL_TABLET | Freq: Two times a day (BID) | ORAL | 1 refills | Status: DC
Start: 2023-09-09 — End: 2024-03-03

## 2023-09-09 MED ORDER — AMLODIPINE BESY-BENAZEPRIL HCL 10-40 MG PO CAPS: 1.0000 | ORAL_CAPSULE | Freq: Every day | ORAL | 1 refills | Status: DC

## 2023-09-09 MED ORDER — CITALOPRAM HYDROBROMIDE 20 MG PO TABS
20.0000 mg | ORAL_TABLET | Freq: Every day | ORAL | 1 refills | Status: DC
Start: 2023-09-09 — End: 2024-03-09

## 2023-09-09 MED ORDER — LORAZEPAM 0.5 MG PO TABS
0.5000 mg | ORAL_TABLET | Freq: Every day | ORAL | 5 refills | Status: DC
Start: 2023-09-09 — End: 2024-03-09

## 2023-09-09 MED ORDER — ROSUVASTATIN CALCIUM 40 MG PO TABS
ORAL_TABLET | ORAL | 1 refills | Status: DC
Start: 2023-09-09 — End: 2024-03-09

## 2023-09-09 NOTE — Progress Notes (Signed)
 Complex Care Management Care Guide Note  09/09/2023 Name: Caitlin Mccarthy MRN: [id number] DOB: September 19, 1935  Caitlin Mccarthy is a 88 y.o. year old female who is a primary care patient of Kori, Goins, FNP and is actively engaged with the care management team. I reached out to [name] by phone today to assist with re-scheduling  with the RN Case Manager.  Follow up plan: Unsuccessful telephone outreach attempt made.   [name]  [facility]  [facility] Institute, [facility] Guide  Direct Dial: [phone]  Fax [phone]

## 2023-09-09 NOTE — Patient Instructions (Signed)
 Fall Prevention in the Home, Adult Falls can cause injuries and can happen to people of all ages. There are many things you can do to make your home safer and to help prevent falls. What actions can I take to prevent falls? General information Use good lighting in all rooms. Make sure to: Replace any light bulbs that burn out. Turn on the lights in dark areas and use night-lights. Keep items that you use often in easy-to-reach places. Lower the shelves around your home if needed. Move furniture so that there are clear paths around it. Do not use throw rugs or other things on the floor that can make you trip. If any of your floors are uneven, fix them. Add color or contrast paint or tape to clearly mark and help you see: Grab bars or handrails. First and last steps of staircases. Where the edge of each step is. If you use a ladder or stepladder: Make sure that it is fully opened. Do not climb a closed ladder. Make sure the sides of the ladder are locked in place. Have someone hold the ladder while you use it. Know where your pets are as you move through your home. What can I do in the bathroom?     Keep the floor dry. Clean up any water on the floor right away. Remove soap buildup in the bathtub or shower. Buildup makes bathtubs and showers slippery. Use non-skid mats or decals on the floor of the bathtub or shower. Attach bath mats securely with double-sided, non-slip rug tape. If you need to sit down in the shower, use a non-slip stool. Install grab bars by the toilet and in the bathtub and shower. Do not use towel bars as grab bars. What can I do in the bedroom? Make sure that you have a light by your bed that is easy to reach. Do not use any sheets or blankets on your bed that hang to the floor. Have a firm chair or bench with side arms that you can use for support when you get dressed. What can I do in the kitchen? Clean up any spills right away. If you need to reach something  above you, use a step stool with a grab bar. Keep electrical cords out of the way. Do not use floor polish or wax that makes floors slippery. What can I do with my stairs? Do not leave anything on the stairs. Make sure that you have a light switch at the top and the bottom of the stairs. Make sure that there are handrails on both sides of the stairs. Fix handrails that are broken or loose. Install non-slip stair treads on all your stairs if they do not have carpet. Avoid having throw rugs at the top or bottom of the stairs. Choose a carpet that does not hide the edge of the steps on the stairs. Make sure that the carpet is firmly attached to the stairs. Fix carpet that is loose or worn. What can I do on the outside of my home? Use bright outdoor lighting. Fix the edges of walkways and driveways and fix any cracks. Clear paths of anything that can make you trip, such as tools or rocks. Add color or contrast paint or tape to clearly mark and help you see anything that might make you trip as you walk through a door, such as a raised step or threshold. Trim any bushes or trees on paths to your home. Check to see if handrails are loose  or broken and that both sides of all steps have handrails. Install guardrails along the edges of any raised decks and porches. Have leaves, snow, or ice cleared regularly. Use sand, salt, or ice melter on paths if you live where there is ice and snow during the winter. Clean up any spills in your garage right away. This includes grease or oil spills. What other actions can I take? Review your medicines with your doctor. Some medicines can cause dizziness or changes in blood pressure, which increase your risk of falling. Wear shoes that: Have a low heel. Do not wear high heels. Have rubber bottoms and are closed at the toe. Feel good on your feet and fit well. Use tools that help you move around if needed. These include: Canes. Walkers. Scooters. Crutches. Ask  your doctor what else you can do to help prevent falls. This may include seeing a physical therapist to learn to do exercises to move better and get stronger. Where to find more information Centers for Disease Control and Prevention, STEADI: TonerPromos.no [employer] on Aging: BaseRingTones.pl National Institute on Aging: BaseRingTones.pl Contact a doctor if: You are afraid of falling at home. You feel weak, drowsy, or dizzy at home. You fall at home. Get help right away if you: Lose consciousness or have trouble moving after a fall. Have a fall that causes a head injury. These symptoms may be an emergency. Get help right away. Call 911. Do not wait to see if the symptoms will go away. Do not drive yourself to the hospital. This information is not intended to replace advice given to you by your health care provider. Make sure you discuss any questions you have with your health care provider. Document Revised: 02/18/2022 Document Reviewed: 02/18/2022 Elsevier Patient Education  2024 [employer].

## 2023-09-09 NOTE — Progress Notes (Signed)
 Subjective:    Patient ID: Caitlin Mccarthy, female    DOB: March 12, 1936, 88 y.o.   MRN: [id number]   Chief Complaint: annual physical  HPI:  Caitlin Mccarthy is a 88 y.o. who identifies as a female who was assigned female at birth.   Social history: Lives with: husband- her daughters are there most of the time Work history: retired   Water engineer in today for follow up of the following chronic medical issues:  1. Essential hypertension, benign No c/o chest pain, sob or headache. Does not check blood pressure at home. BP Readings from Last 3 Encounters:  07/21/23 134/74  04/21/23 (!) 160/62  04/16/23 (!) 158/116     2. Chronic respiratory failure with hypoxia (HCC) 3. Simple chronic bronchitis (HCC) No issues. No cough. Not on oxygen anymore  4. Gastroesophageal reflux disease without esophagitis Uses OTC meds when needed  5. Hyperlipidemia due to type 2 diabetes mellitus (HCC) Eats whatever her family makes her to eat. Lab Results  Component Value Date   CHOL 138 03/17/2023   HDL 34 (L) 03/17/2023   LDLCALC 68 03/17/2023   TRIG 220 (H) 03/17/2023   CHOLHDL 4.1 03/17/2023     6. Type 2 diabetes mellitus with hyperglycemia, without long-term current use of insulin (HCC) Bllod sugars running 120's on average Lab Results  Component Value Date   HGBA1C 5.7 (H) 03/17/2023     7. Episode of recurrent major depressive disorder, unspecified depression episode severity (HCC) Is on no antidepressant.  Is doing okay    09/09/2023    2:15 PM 03/17/2023    2:18 PM 02/26/2023    1:31 PM  Depression screen PHQ 2/9  Decreased Interest 2 3 0  Down, Depressed, Hopeless 1 2 0  PHQ - 2 Score 3 5 0  Altered sleeping 3 3   Tired, decreased energy 2 3   Change in appetite 2 2   Feeling bad or failure about yourself  1 2   Trouble concentrating 2 3   Moving slowly or fidgety/restless 1 2   Suicidal thoughts 0 0   PHQ-9 Score 14 20   Difficult doing work/chores Somewhat  difficult Somewhat difficult       8. GAD (generalized anxiety disorder) Has had ativan in the past. Takes at night.     09/09/2023    2:16 PM 03/17/2023    2:19 PM 11/18/2022   11:45 AM 08/30/2022   11:47 AM  GAD 7 : Generalized Anxiety Score  Nervous, Anxious, on Edge 2 3 2 2   Control/stop worrying 2 2 2 2   Worry too much - different things 2 3 2 2   Trouble relaxing 2 3 3 3   Restless 2 2 1 2   Easily annoyed or irritable 1 3 2 3   Afraid - awful might happen 1 2 2 2   Total GAD 7 Score 12 18 14 16   Anxiety Difficulty Somewhat difficult Somewhat difficult Somewhat difficult Somewhat difficult       9. History of cerebrovascular accident (CVA) No permanent effects. Is no longer on eliquis  10. Vitamin D deficiency Is on daily vitamin d suppelement  11. Hypokalemia No c/o muscle cramps Lab Results  Component Value Date   K 4.9 07/21/2023     12. Other osteoporosis without current pathological fracture Is no longer doing bone density tests  13. Overweight (BMI 25.0-29.9) Weight is down 1 lbs  Wt Readings from Last 3 Encounters:  09/09/23 186 lb (84.4 kg)  07/21/23 187 lb (84.8 kg)  04/21/23 191 lb (86.6 kg)   BMI Readings from Last 3 Encounters:  09/09/23 26.69 kg/m  07/21/23 26.45 kg/m  04/21/23 27.41 kg/m       New complaints: None today  Allergies  Allergen Reactions   Gabapentin Other (See Comments)    Confusion and somnolence    Latex     Other Reaction(s): Not available   Outpatient Encounter Medications as of 09/09/2023  Medication Sig   acetaminophen (TYLENOL) 650 MG CR tablet Take 650 mg by mouth every 8 (eight) hours as needed for pain.   albuterol (VENTOLIN HFA) 108 (90 Base) MCG/ACT inhaler Inhale 2 puffs into the lungs every 6 (six) hours as needed for wheezing or shortness of breath.   Alcohol Swabs (B-D SINGLE USE SWABS REGULAR) PADS Test BS daily and as needed Dx E11.65   amLODipine-benazepril (LOTREL) 10-40 MG capsule Take 1  capsule by mouth daily.   aspirin EC 81 MG tablet Take 1 tablet (81 mg total) by mouth daily.   baclofen (LIORESAL) 10 MG tablet Take 1 tablet (10 mg total) by mouth 3 (three) times daily.   Blood Glucose Calibration (TRUE METRIX LEVEL 1) Low SOLN Use with glucose monitor Dx E11.65   Blood Glucose Monitoring Suppl (TRUE METRIX AIR GLUCOSE METER) w/Device KIT Test BS daily and as needed Dx E11.65   citalopram (CELEXA) 20 MG tablet Take 1 tablet (20 mg total) by mouth daily.   fenofibrate (TRICOR) 145 MG tablet Take 1 tablet (145 mg total) by mouth daily.   glucose blood (TRUE METRIX BLOOD GLUCOSE TEST) test strip Test BS daily and as needed Dx E11.65   hydrochlorothiazide (HYDRODIURIL) 12.5 MG tablet Take 1 tablet (12.5 mg total) by mouth daily.   ibuprofen (ADVIL) 200 MG tablet Take 200 mg by mouth every 6 (six) hours as needed.   icosapent Ethyl (VASCEPA) 1 g capsule Take 2 capsules (2 g total) by mouth 2 (two) times daily.   LORazepam (ATIVAN) 0.5 MG tablet Take 1 tablet (0.5 mg total) by mouth at bedtime. for anxiety   metFORMIN (GLUCOPHAGE-XR) 500 MG 24 hr tablet Take 1 tablet (500 mg total) by mouth 2 (two) times daily with a meal.   nitrofurantoin, macrocrystal-monohydrate, (MACROBID) 100 MG capsule Take 1 capsule (100 mg total) by mouth 2 (two) times daily. X 7 days   nystatin (MYCOSTATIN/NYSTOP) powder APPLY TOPICALLY FOUR TIMES DAILY (Patient taking differently: Apply 1 Application topically 4 (four) times daily.)   ondansetron (ZOFRAN-ODT) 4 MG disintegrating tablet TAKE 1 TABLET EVERY 8 HOURS AS NEEDED FOR NAUSEA AND VOMITING   oseltamivir (TAMIFLU) 75 MG capsule Take 1 capsule (75 mg total) by mouth 2 (two) times daily.   pantoprazole (PROTONIX) 40 MG tablet TAKE ONE (1) TABLET BY MOUTH EVERY DAY   polyethylene glycol (MIRALAX / GLYCOLAX) 17 g packet Take 17 g by mouth daily.   PREMARIN vaginal cream Apply 1 application topically daily as needed (irritation).    rosuvastatin (CRESTOR)  40 MG tablet TAKE ONE (1) TABLET EACH DAY   traMADol (ULTRAM) 50 MG tablet Take 1 tablet (50 mg total) by mouth every 12 (twelve) hours as needed.   TRUEplus Lancets 33G MISC Test BS daily and as needed Dx E11.65   Vitamin D, Ergocalciferol, (DRISDOL) 1.25 MG (50000 UNIT) CAPS capsule Take 1 capsule (50,000 Units total) by mouth every 7 (seven) days.   No facility-administered encounter medications on file as of 09/09/2023.    Past Surgical  History:  Procedure Laterality Date   CATARACT EXTRACTION W/PHACO  06/06/2011   Procedure: CATARACT EXTRACTION PHACO AND INTRAOCULAR LENS PLACEMENT (IOC);  Surgeon: Gemma Payor;  Location: [facility];  Service: Ophthalmology;  Laterality: Right;  CDE=12.77   CATARACT EXTRACTION W/PHACO  06/27/2011   Procedure: CATARACT EXTRACTION PHACO AND INTRAOCULAR LENS PLACEMENT (IOC);  Surgeon: Gemma Payor;  Location: [facility];  Service: Ophthalmology;  Laterality: Left;  CDE:13.96   CYSTOSCOPY WITH URETEROSCOPY AND STENT PLACEMENT Right 04/27/2015   Procedure: CYSTOSCOPY WITH RIGHT URETEROSCOPY, BASKET REMOVAL OF STONE, REMOVAL OF RIGHT NEPHROSTOMY TUBE;  Surgeon: Bjorn Pippin, MD;  [facility];  Service: Urology;  Laterality: Right;   CYSTOSCOPY/URETEROSCOPY/HOLMIUM LASER/STENT PLACEMENT Left 12/24/2016   Procedure: LEFT URETEROSCOPY WITH HOLMIUM LASER AND STENT PLACEMENT;  Surgeon: Bjorn Pippin, MD;  Location: [facility];  Service: Urology;  Laterality: Left;   EXTRACORPOREAL SHOCK WAVE LITHOTRIPSY  left 04-01-2016;  1980's   FRACTURE SURGERY     HIP ARTHROPLASTY     HOLMIUM LASER APPLICATION Right 04/27/2015   Procedure: HOLMIUM LASER APPLICATION;  Surgeon: Bjorn Pippin, MD;  Location: [facility];  Service: Urology;  Laterality: Right;   KNEE ARTHROPLASTY     KNEE ARTHROSCOPY Bilateral right 2005//  left ?   LOOP RECORDER INSERTION N/A 04/01/2017   Procedure: LOOP RECORDER INSERTION;  Surgeon: Hillis Range, MD;  Location:  [facility];  Service: Cardiovascular;  Laterality: N/A;   ORIF FEMUR FRACTURE Right 11/27/2022   Procedure: OPEN REDUCTION INTERNAL FIXATION (ORIF) DISTAL FEMUR FRACTURE;  Surgeon: Roby Lofts, MD;  Location: [facility];  Service: Orthopedics;  Laterality: Right;   TOTAL HIP ARTHROPLASTY  10/28/2011   Procedure: TOTAL HIP ARTHROPLASTY;  Surgeon: Loanne Drilling, MD;  Location: [facility];  Service: Orthopedics;  Laterality: Right;   TOTAL KNEE ARTHROPLASTY Bilateral left 03-09-2007//  right 2006   TRANSTHORACIC ECHOCARDIOGRAM  10/06/2006   normal echo,  ef 55-60%   TUBAL LIGATION      Family History  Problem Relation Age of Onset   Kidney disease Mother    Diabetes Mother    Congestive Heart Failure Father    Heart failure Father    Heart disease Brother    Alcohol abuse Brother    Anesthesia problems Neg Hx    Hypotension Neg Hx    Malignant hyperthermia Neg Hx    Pseudochol deficiency Neg Hx       Controlled substance contract: 09/03/22     Review of Systems  Constitutional:  Negative for diaphoresis.  Eyes:  Negative for pain.  Respiratory:  Negative for shortness of breath.   Cardiovascular:  Negative for chest pain, palpitations and leg swelling.  Gastrointestinal:  Negative for abdominal pain.  Endocrine: Negative for polydipsia.  Skin:  Negative for rash.  Neurological:  Negative for dizziness, weakness and headaches.  Hematological:  Does not bruise/bleed easily.  All other systems reviewed and are negative.      Objective:   Physical Exam Vitals and nursing note reviewed.  Constitutional:      General: She is not in acute distress.    Appearance: Normal appearance. She is well-developed.  HENT:     Head: Normocephalic.     Right Ear: Tympanic membrane normal.     Left Ear: Tympanic membrane normal.     Nose: Nose normal.     Mouth/Throat:     Mouth: Mucous membranes are moist.  Eyes:     Pupils:  Pupils are equal, round, and reactive to light.   Neck:     Vascular: No carotid bruit or JVD.  Cardiovascular:     Rate and Rhythm: Normal rate and regular rhythm.     Heart sounds: Normal heart sounds.  Pulmonary:     Effort: Pulmonary effort is normal. No respiratory distress.     Breath sounds: Normal breath sounds. No wheezing or rales.  Chest:     Chest wall: No tenderness.  Abdominal:     General: Bowel sounds are normal. There is no distension or abdominal bruit.     Palpations: Abdomen is soft. There is no hepatomegaly, splenomegaly, mass or pulsatile mass.     Tenderness: There is no abdominal tenderness.  Musculoskeletal:        General: Tenderness: walking with walker. Normal range of motion.     Cervical back: Normal range of motion and neck supple.  Lymphadenopathy:     Cervical: No cervical adenopathy.  Skin:    General: Skin is warm and dry.  Neurological:     Mental Status: She is alert and oriented to person, place, and time.     Deep Tendon Reflexes: Reflexes are normal and symmetric.  Psychiatric:        Behavior: Behavior normal.        Thought Content: Thought content normal.        Judgment: Judgment normal.     BP (!) 143/79   Pulse 78   Temp (!) 97 F (36.1 C) (Temporal)   Ht 5\' 10"  (1.778 m)   Wt 186 lb (84.4 kg)   SpO2 94%   BMI 26.69 kg/m    HGBA1c 5.8%     Assessment & Plan:  Caitlin Mccarthy comes in today with chief complaint of Annual Exam   Diagnosis and orders addressed:  1. Annual physical exam (Primary) - Thyroid Panel With TSH  2. Essential hypertension, benign Low sodium diet - CBC with Differential/Platelet - CMP14+EGFR - amLODipine-benazepril (LOTREL) 10-40 MG capsule; Take 1 capsule by mouth daily.  Dispense: 90 capsule; Refill: 1  3. Chronic respiratory failure with hypoxia (HCC) Report any SOB  4. Simple chronic bronchitis (HCC) Report any cough  5. Gastroesophageal reflux disease without esophagitis Avoid spicy foods Do not eat 2 hours prior to  bedtime   6. Hyperlipidemia due to type 2 diabetes mellitus (HCC) Low fat diet - Lipid panel - fenofibrate (TRICOR) 145 MG tablet; Take 1 tablet (145 mg total) by mouth daily.  Dispense: 90 tablet; Refill: 1 - rosuvastatin (CRESTOR) 40 MG tablet; TAKE ONE (1) TABLET EACH DAY  Dispense: 90 tablet; Refill: 1  7. Diabetes mellitus treated with oral medication (HCC) Continue to watch carbs in diet - Bayer DCA Hb A1c Waived - metFORMIN (GLUCOPHAGE-XR) 500 MG 24 hr tablet; Take 1 tablet (500 mg total) by mouth 2 (two) times daily with a meal.  Dispense: 180 tablet; Refill: 1  8. Episode of recurrent major depressive disorder, unspecified depression episode severity (HCC) Stress management - citalopram (CELEXA) 20 MG tablet; Take 1 tablet (20 mg total) by mouth daily.  Dispense: 90 tablet; Refill: 1  9. GAD (generalized anxiety disorder) - ToxASSURE Select 13 (MW), Urine - LORazepam (ATIVAN) 0.5 MG tablet; Take 1 tablet (0.5 mg total) by mouth at bedtime. for anxiety  Dispense: 30 tablet; Refill: 5  10. Hypokalemia Labs pending  11. History of CVA (cerebrovascular accident) Fall prevention  12. Other osteoporosis without current pathological fracture Weight bearing  exercise when can tolerate  13. Overweight (BMI 25.0-29.9) Discussed diet and exercise for person with BMI >25 Will recheck weight in 3-6 months    Labs pending Health Maintenance reviewed Diet and exercise encouraged  Follow up plan: 6 months   Mary-Margaret Daphine Deutscher, FNP

## 2023-09-10 LAB — THYROID PANEL WITH TSH
Free Thyroxine Index: 2.2 (ref 1.2–4.9)
T3 Uptake Ratio: 25 % (ref 24–39)
T4, Total: 8.9 ug/dL (ref 4.5–12.0)
TSH: 2.46 u[IU]/mL (ref 0.450–4.500)

## 2023-09-10 LAB — CBC WITH DIFFERENTIAL/PLATELET
Basophils Absolute: 0 10*3/uL (ref 0.0–0.2)
Basos: 1 %
EOS (ABSOLUTE): 0.1 10*3/uL (ref 0.0–0.4)
Eos: 2 %
Hematocrit: 39.5 % (ref 34.0–46.6)
Hemoglobin: 12.6 g/dL (ref 11.1–15.9)
Immature Grans (Abs): 0 10*3/uL (ref 0.0–0.1)
Immature Granulocytes: 0 %
Lymphocytes Absolute: 3.5 10*3/uL — ABNORMAL HIGH (ref 0.7–3.1)
Lymphs: 55 %
MCH: 29.7 pg (ref 26.6–33.0)
MCHC: 31.9 g/dL (ref 31.5–35.7)
MCV: 93 fL (ref 79–97)
Monocytes Absolute: 0.6 10*3/uL (ref 0.1–0.9)
Monocytes: 10 %
Neutrophils Absolute: 2 10*3/uL (ref 1.4–7.0)
Neutrophils: 32 %
Platelets: 228 10*3/uL (ref 150–450)
RBC: 4.24 x10E6/uL (ref 3.77–5.28)
RDW: 12.3 % (ref 11.7–15.4)
WBC: 6.3 10*3/uL (ref 3.4–10.8)

## 2023-09-10 LAB — CMP14+EGFR
ALT: 21 IU/L (ref 0–32)
AST: 36 IU/L (ref 0–40)
Albumin: 4.4 g/dL (ref 3.7–4.7)
Alkaline Phosphatase: 63 IU/L (ref 44–121)
BUN/Creatinine Ratio: 35 — ABNORMAL HIGH (ref 12–28)
BUN: 26 mg/dL (ref 8–27)
Bilirubin Total: 0.3 mg/dL (ref 0.0–1.2)
CO2: 23 mmol/L (ref 20–29)
Calcium: 11.5 mg/dL — ABNORMAL HIGH (ref 8.7–10.3)
Chloride: 104 mmol/L (ref 96–106)
Creatinine, Ser: 0.75 mg/dL (ref 0.57–1.00)
Globulin, Total: 3 g/dL (ref 1.5–4.5)
Glucose: 100 mg/dL — ABNORMAL HIGH (ref 70–99)
Potassium: 4.8 mmol/L (ref 3.5–5.2)
Sodium: 140 mmol/L (ref 134–144)
Total Protein: 7.4 g/dL (ref 6.0–8.5)
eGFR: 78 mL/min/{1.73_m2} (ref >59)

## 2023-09-10 LAB — LIPID PANEL
Chol/HDL Ratio: 3.4 ratio (ref 0.0–4.4)
Cholesterol, Total: 154 mg/dL (ref 100–199)
HDL: 45 mg/dL (ref >39)
LDL Chol Calc (NIH): 84 mg/dL (ref 0–99)
Triglycerides: 143 mg/dL (ref 0–149)
VLDL Cholesterol Cal: 25 mg/dL (ref 5–40)

## 2023-09-16 DIAGNOSIS — E1151 Type 2 diabetes mellitus with diabetic peripheral angiopathy without gangrene: Secondary | ICD-10-CM | POA: Diagnosis not present

## 2023-09-16 DIAGNOSIS — L84 Corns and callosities: Secondary | ICD-10-CM | POA: Diagnosis not present

## 2023-09-16 DIAGNOSIS — M79676 Pain in unspecified toe(s): Secondary | ICD-10-CM | POA: Diagnosis not present

## 2023-09-16 DIAGNOSIS — B351 Tinea unguium: Secondary | ICD-10-CM | POA: Diagnosis not present

## 2023-09-17 NOTE — Progress Notes (Signed)
 Complex Care Management Care Guide Note  09/17/2023 Name: Caitlin Mccarthy MRN: [id number] DOB: August 24, 1935  Caitlin Mccarthy is a 88 y.o. year old female who is a primary care patient of Walterine, Amodei, FNP and is actively engaged with the care management team. I reached out to [name] by phone today to assist with re-scheduling  with the RN Case Manager.  Follow up plan: Unsuccessful telephone outreach attempt made.No further outreach attempts will be made at this time. We have been unable to contact the patient to reschedule for complex care management services.   [name]  [facility]  [facility], [facility] Guide  Direct Dial: [phone]  Fax [phone]

## 2023-12-04 DIAGNOSIS — B351 Tinea unguium: Secondary | ICD-10-CM | POA: Diagnosis not present

## 2023-12-04 DIAGNOSIS — M79676 Pain in unspecified toe(s): Secondary | ICD-10-CM | POA: Diagnosis not present

## 2023-12-04 DIAGNOSIS — E1151 Type 2 diabetes mellitus with diabetic peripheral angiopathy without gangrene: Secondary | ICD-10-CM | POA: Diagnosis not present

## 2023-12-04 DIAGNOSIS — L84 Corns and callosities: Secondary | ICD-10-CM | POA: Diagnosis not present

## 2023-12-18 ENCOUNTER — Other Ambulatory Visit: Payer: Self-pay | Admitting: Nurse Practitioner

## 2023-12-19 ENCOUNTER — Telehealth: Payer: Self-pay

## 2023-12-19 NOTE — Telephone Encounter (Signed)
 Copied from CRM [id number]. Topic: Clinical - Prescription Issue >> Dec 19, 2023 11:37 AM Lotus [place] wrote: Reason for CRM: pt daughter called in to see if the pt can possibly get a refill for pt traMADol  (ULTRAM ) 50 MG tablet medication since there are no more refills . If possible to have it sent to   [facility] - [place], [place] - [address] [place] [address] Phone: [phone] Fax: [phone] Hours: Not open 24 hours

## 2023-12-19 NOTE — Telephone Encounter (Signed)
 Duplicate message. Replied to Mychart message that patient sent as well

## 2023-12-31 ENCOUNTER — Other Ambulatory Visit: Payer: Self-pay | Admitting: Cardiovascular Disease

## 2024-01-06 ENCOUNTER — Ambulatory Visit (HOSPITAL_BASED_OUTPATIENT_CLINIC_OR_DEPARTMENT_OTHER)
Admission: RE | Admit: 2024-01-06 | Discharge: 2024-01-06 | Disposition: A | Discharge status: Home / Self Care | Source: Ambulatory Visit | Attending: Family | Admitting: Family

## 2024-01-06 DIAGNOSIS — I700 Atherosclerosis of aorta: Secondary | ICD-10-CM | POA: Diagnosis not present

## 2024-01-06 DIAGNOSIS — R918 Other nonspecific abnormal finding of lung field: Secondary | ICD-10-CM | POA: Diagnosis not present

## 2024-01-06 DIAGNOSIS — R911 Solitary pulmonary nodule: Secondary | ICD-10-CM | POA: Diagnosis not present

## 2024-01-09 ENCOUNTER — Ambulatory Visit (HOSPITAL_BASED_OUTPATIENT_CLINIC_OR_DEPARTMENT_OTHER): Payer: Self-pay | Admitting: Family

## 2024-01-21 ENCOUNTER — Ambulatory Visit: Payer: Self-pay

## 2024-01-21 NOTE — Telephone Encounter (Signed)
 Refer to [employer]

## 2024-01-21 NOTE — Telephone Encounter (Signed)
   Call dropped prior to finishing Triage with patient's daughter who was on her way to the patient's house Called the daughter back and she advised that her sister had already called the office and got things set up with dropping off urine/setting up a video appointment. She was no longer in need of my assistance and was advised to call back if she needed anything and if anything worsened to get the patient to the Emergency Room She verbalized understanding   FYI Only or Action Required?: Action required by provider: daughter states her sister had already taken care of this situation and was no longer in need of my assistance.  Patient was last seen in primary care on 09/09/2023 by Gladis Mustard, FNP.  Called Nurse Triage reporting Urinary Symptoms.  Symptoms began 2 days ago.  Interventions attempted: Rest, hydration, or home remedies.  Symptoms are: gradually worsening.  Triage Disposition: No disposition on file.  Patient/caregiver understands and will follow disposition?: N/A                                    Summary: Uti   Pt has a uti and there is some burning. Larance pt daughter is calling  [phone]     Answer Assessment - Initial Assessment Questions 1. SYMPTOM: What's the main symptom you're concerned about? (e.g., frequency, incontinence)     frequency 2. ONSET: When did the  frequency/confusion  start?     2 days ago 3. PAIN: Is there any pain? If Yes, ask: How bad is it? (Scale: 1-10; mild, moderate, severe)     0 4. CAUSE: What do you think is causing the symptoms?     UTI per daughter 5. OTHER SYMPTOMS: Do you have any other symptoms? (e.g., blood in urine, fever, flank pain, pain with urination)     Daughter states confusion and agitation  Protocols used: Urinary Symptoms-A-AH

## 2024-02-27 ENCOUNTER — Ambulatory Visit: Payer: Medicare HMO

## 2024-02-27 VITALS — BP 143/79 | HR 78 | Ht 70.0 in | Wt 186.0 lb

## 2024-02-27 DIAGNOSIS — Z0000 Encounter for general adult medical examination without abnormal findings: Secondary | ICD-10-CM

## 2024-02-27 NOTE — Patient Instructions (Signed)
 Caitlin Mccarthy , Thank you for taking time out of your busy schedule to complete your Annual Wellness Visit with me. I enjoyed our conversation and look forward to speaking with you again next year. I, as well as your care team,  appreciate your ongoing commitment to your health goals. Please review the following plan we discussed and let me know if I can assist you in the future. Your Game plan/ To Do List    Referrals: If you haven't heard from the office you've been referred to, please reach out to them at the phone provided.   Follow up Visits: We will see or speak with you next year for your Next Medicare AWV with our clinical staff on 03/01/25 at 1:50p.m. Have you seen your provider in the last 6 months (3 months if uncontrolled diabetes)? Yes  Clinician Recommendations:  Aim for 30 minutes of exercise or brisk walking, 6-8 glasses of water, and 5 servings of fruits and vegetables each day.       This is a list of the screenings recommended for you:  Health Maintenance  Topic Date Due   DEXA scan (bone density measurement)  04/10/2018   Eye exam for diabetics  05/26/2019   Complete foot exam   02/08/2022   COVID-19 Vaccine (3 - 2024-25 season) 03/02/2023   Medicare Annual Wellness Visit  02/26/2024   Flu Shot  01/30/2024   Yearly kidney health urinalysis for diabetes  03/16/2024   DTaP/Tdap/Td vaccine (3 - Td or Tdap) 09/08/2024*   Hemoglobin A1C  03/11/2024   Yearly kidney function blood test for diabetes  09/08/2024   Pneumococcal Vaccine for age over 79  Completed   Zoster (Shingles) Vaccine  Completed   HPV Vaccine  Aged Out   Meningitis B Vaccine  Aged Out  *Topic was postponed. The date shown is not the original due date.    Advanced directives: (Copy Requested) Please bring a copy of your health care power of attorney and living will to the office to be added to your chart at your convenience. You can mail to [facility] [address] [place], [place]  [address] or email to [email or web address] Advance Care Planning is important because it:  [x]  Makes sure you receive the medical care that is consistent with your values, goals, and preferences  [x]  It provides guidance to your family and loved ones and reduces their decisional burden about whether or not they are making the right decisions based on your wishes.  Follow the link provided in your after visit summary or read over the paperwork we have mailed to you to help you started getting your Advance Directives in place. If you need assistance in completing these, please reach out to us  so that we can help you!  See attachments for Preventive Care and Fall Prevention Tips.

## 2024-02-27 NOTE — Progress Notes (Signed)
 Subjective:   Caitlin Mccarthy is a 88 y.o. who presents for a Medicare Wellness preventive visit.  As a reminder, Annual Wellness Visits don't include a physical exam, and some assessments may be limited, especially if this visit is performed virtually. We may recommend an in-person follow-up visit with your provider if needed.  Visit Complete: Virtual I connected with  Caitlin Mccarthy on 02/27/24 by a audio enabled telemedicine application and verified that I am speaking with the correct person using two identifiers.  Patient Location: Home  Provider Location: Home Office  I discussed the limitations of evaluation and management by telemedicine. The patient expressed understanding and agreed to proceed.  Vital Signs: Because this visit was a virtual/telehealth visit, some criteria may be missing or patient reported. Any vitals not documented were not able to be obtained and vitals that have been documented are patient reported.  VideoDeclined- This patient declined Librarian, academic. Therefore the visit was completed with audio only.  Persons Participating in Visit: Patient.  AWV Questionnaire: No: Patient Medicare AWV questionnaire was not completed prior to this visit.  Cardiac Risk Factors include: advanced age (>61men, >59 women);diabetes mellitus;dyslipidemia;hypertension     Objective:    Today's Vitals   02/27/24 1135  BP: (!) 143/79  Pulse: 78  Weight: 186 lb (84.4 kg)  Height: 5' 10 (1.778 m)   Body mass index is 26.69 kg/m.     02/27/2024   12:03 PM 04/16/2023    1:29 PM 04/07/2023   12:57 PM 02/26/2023    1:32 PM 11/27/2022    2:23 AM 11/26/2022    7:21 PM 11/02/2022    8:00 PM  Advanced Directives  Does Patient Have a Medical Advance Directive? Yes Yes Yes Yes  No Yes  Type of Forensic scientist of [employer] Power of [place];Living will     Does patient want to make  changes to medical advance directive?       Yes (Inpatient - patient defers changing a medical advance directive at this time - Information given)  Copy of Healthcare Power of Attorney in Chart? No - copy requested   No - copy requested     Would patient like information on creating a medical advance directive?     No - Patient declined      Current Medications (verified) Outpatient Encounter Medications as of 02/27/2024  Medication Sig   acetaminophen  (TYLENOL ) 650 MG CR tablet Take 650 mg by mouth every 8 (eight) hours as needed for pain.   albuterol  (VENTOLIN  HFA) 108 (90 Base) MCG/ACT inhaler Inhale 2 puffs into the lungs every 6 (six) hours as needed for wheezing or shortness of breath.   Alcohol Swabs (B-D SINGLE USE SWABS REGULAR) PADS Test BS daily and as needed Dx E11.65   amLODipine -benazepril  (LOTREL) 10-40 MG capsule Take 1 capsule by mouth daily.   aspirin  EC 81 MG tablet Take 1 tablet (81 mg total) by mouth daily.   baclofen  (LIORESAL ) 10 MG tablet Take 1 tablet (10 mg total) by mouth 3 (three) times daily.   Blood Glucose Calibration (TRUE METRIX LEVEL 1) Low SOLN Use with glucose monitor Dx E11.65   Blood Glucose Monitoring Suppl (TRUE METRIX AIR GLUCOSE METER) w/Device KIT Test BS daily and as needed Dx E11.65   citalopram  (CELEXA ) 20 MG tablet Take 1 tablet (20 mg total) by mouth daily.   fenofibrate  (TRICOR ) 145 MG tablet Take 1 tablet (145  mg total) by mouth daily.   glucose blood (TRUE METRIX BLOOD GLUCOSE TEST) test strip Test BS daily and as needed Dx E11.65   hydrochlorothiazide  (HYDRODIURIL ) 12.5 MG tablet Take 1 tablet (12.5 mg total) by mouth daily.   ibuprofen (ADVIL) 200 MG tablet Take 200 mg by mouth every 6 (six) hours as needed.   icosapent  Ethyl (VASCEPA ) 1 g capsule Take 2 capsules (2 g total) by mouth 2 (two) times daily.   LORazepam  (ATIVAN ) 0.5 MG tablet Take 1 tablet (0.5 mg total) by mouth at bedtime. for anxiety   metFORMIN  (GLUCOPHAGE -XR) 500 MG 24 hr  tablet Take 1 tablet (500 mg total) by mouth 2 (two) times daily with a meal.   nitrofurantoin , macrocrystal-monohydrate, (MACROBID ) 100 MG capsule Take 1 capsule (100 mg total) by mouth 2 (two) times daily. X 7 days   nystatin  (MYCOSTATIN /NYSTOP ) powder APPLY TOPICALLY FOUR TIMES DAILY (Patient taking differently: Apply 1 Application topically 4 (four) times daily.)   ondansetron  (ZOFRAN -ODT) 4 MG disintegrating tablet TAKE 1 TABLET EVERY 8 HOURS AS NEEDED FOR NAUSEA AND VOMITING   pantoprazole  (PROTONIX ) 40 MG tablet TAKE ONE (1) TABLET BY MOUTH EVERY DAY   polyethylene glycol (MIRALAX  / GLYCOLAX ) 17 g packet Take 17 g by mouth daily.   PREMARIN vaginal cream Apply 1 application topically daily as needed (irritation).    rosuvastatin  (CRESTOR ) 40 MG tablet TAKE ONE (1) TABLET EACH DAY   traMADol  (ULTRAM ) 50 MG tablet Take 1 tablet (50 mg total) by mouth every 12 (twelve) hours as needed.   TRUEplus Lancets 33G MISC Test BS daily and as needed Dx E11.65   Vitamin D , Ergocalciferol , (DRISDOL ) 1.25 MG (50000 UNIT) CAPS capsule Take 1 capsule (50,000 Units total) by mouth every 7 (seven) days.   No facility-administered encounter medications on file as of 02/27/2024.    Allergies (verified) Gabapentin  and Latex   History: Past Medical History:  Diagnosis Date   Anxiety    Depression    GERD (gastroesophageal reflux disease)    Heart murmur    History of acute pyelonephritis    last episode 04-13-2015  w/ sepsis   History of kidney stones    History of recurrent UTIs    MULTIPLE   Hyperlipidemia    Hypertension    OA (osteoarthritis)    knees, hips, hands   Osteoporosis    PAF (paroxysmal atrial fibrillation) (HCC) 04/21/2023   Poor memory    especially when has uti   Renal calculus, left    Stroke (HCC)    Type 2 diabetes mellitus (HCC)    Vitamin D  deficiency 05/10/2016   Past Surgical History:  Procedure Laterality Date   CATARACT EXTRACTION W/PHACO  06/06/2011    Procedure: CATARACT EXTRACTION PHACO AND INTRAOCULAR LENS PLACEMENT (IOC);  Surgeon: Cherene Mania;  Location: [facility];  Service: Ophthalmology;  Laterality: Right;  CDE=12.77   CATARACT EXTRACTION W/PHACO  06/27/2011   Procedure: CATARACT EXTRACTION PHACO AND INTRAOCULAR LENS PLACEMENT (IOC);  Surgeon: Cherene Mania;  Location: [facility];  Service: Ophthalmology;  Laterality: Left;  CDE:13.96   CYSTOSCOPY WITH URETEROSCOPY AND STENT PLACEMENT Right 04/27/2015   Procedure: CYSTOSCOPY WITH RIGHT URETEROSCOPY, BASKET REMOVAL OF STONE, REMOVAL OF RIGHT NEPHROSTOMY TUBE;  Surgeon: Norleen Seltzer, MD;  Location: [facility];  Service: Urology;  Laterality: Right;   CYSTOSCOPY/URETEROSCOPY/HOLMIUM LASER/STENT PLACEMENT Left 12/24/2016   Procedure: LEFT URETEROSCOPY WITH HOLMIUM LASER AND STENT PLACEMENT;  Surgeon: Seltzer Norleen, MD;  Location: [facility];  Service: Urology;  Laterality: Left;   EXTRACORPOREAL SHOCK WAVE LITHOTRIPSY  left 04-01-2016;  1980's   FRACTURE SURGERY     HIP ARTHROPLASTY     HOLMIUM LASER APPLICATION Right 04/27/2015   Procedure: HOLMIUM LASER APPLICATION;  Surgeon: Norleen Seltzer, MD;  Location: [facility];  Service: Urology;  Laterality: Right;   KNEE ARTHROPLASTY     KNEE ARTHROSCOPY Bilateral right 2005//  left ?   LOOP RECORDER INSERTION N/A 04/01/2017   Procedure: LOOP RECORDER INSERTION;  Surgeon: Kelsie Agent, MD;  Location: [facility];  Service: Cardiovascular;  Laterality: N/A;   ORIF FEMUR FRACTURE Right 11/27/2022   Procedure: OPEN REDUCTION INTERNAL FIXATION (ORIF) DISTAL FEMUR FRACTURE;  Surgeon: Kendal Franky SQUIBB, MD;  Location: [facility];  Service: Orthopedics;  Laterality: Right;   TOTAL HIP ARTHROPLASTY  10/28/2011   Procedure: TOTAL HIP ARTHROPLASTY;  Surgeon: Dempsey LULLA Moan, MD;  Location: [facility];  Service: Orthopedics;  Laterality: Right;   TOTAL KNEE ARTHROPLASTY Bilateral left 03-09-2007//  right 2006   TRANSTHORACIC  ECHOCARDIOGRAM  10/06/2006   normal echo,  ef 55-60%   TUBAL LIGATION     Family History  Problem Relation Age of Onset   Kidney disease Mother    Diabetes Mother    Congestive Heart Failure Father    Heart failure Father    Heart disease Father    Heart disease Brother    Alcohol abuse Brother    Anesthesia problems Neg Hx    Hypotension Neg Hx    Malignant hyperthermia Neg Hx    Pseudochol deficiency Neg Hx    Social History   Socioeconomic History   Marital status: Married    Spouse name: Ted   Number of children: 2   Years of education: 11   Highest education level: 11th grade  Occupational History   Occupation: Retired    Comment: retired  Tobacco Use   Smoking status: Former    Current packs/day: 0.00    Average packs/day: 0.3 packs/day for 42.0 years (10.5 ttl pk-yrs)    Types: Cigarettes    Start date: 10/31/1980    Quit date: 11/01/2022    Years since quitting: 1.3   Smokeless tobacco: Never   Tobacco comments:    1 pack last 4 days   Vaping Use   Vaping status: Never Used  Substance and Sexual Activity   Alcohol use: No   Drug use: No   Sexual activity: Not Currently    Birth control/protection: Post-menopausal  Other Topics Concern   Not on file  Social History Narrative   Lives with husband   Her daughters take turns coming daily.   Social Drivers of Corporate investment banker Strain: Low Risk  (02/27/2024)   Overall Financial Resource Strain (CARDIA)    Difficulty of Paying Living Expenses: Not hard at all  Food Insecurity: No Food Insecurity (02/27/2024)   Hunger Vital Sign    Worried About Running Out of Food in the Last Year: Never true    Ran Out of Food in the Last Year: Never true  Transportation Needs: No Transportation Needs (02/27/2024)   PRAPARE - Administrator, Civil Service (Medical): No    Lack of Transportation (Non-Medical): No  Physical Activity: Insufficiently Active (02/27/2024)   Exercise Vital Sign    Days of  Exercise per Week: 7 days    Minutes of Exercise per Session: 10 min  Stress: Stress Concern Present (09/06/2023)  [employer] of Occupational Health - Occupational Stress Questionnaire    Feeling of Stress : To some extent  Social Connections: Moderately Isolated (02/27/2024)   Social Connection and Isolation Panel    Frequency of Communication with Friends and Family: More than three times a week    Frequency of Social Gatherings with Friends and Family: More than three times a week    Attends Religious Services: Never    Database administrator or Organizations: No    Attends Engineer, structural: Never    Marital Status: Married    Tobacco Counseling Counseling given: Yes Tobacco comments: 1 pack last 4 days     Clinical Intake:  Pre-visit preparation completed: Yes  Pain : No/denies pain     BMI - recorded: 26.69 Nutritional Status: BMI 25 -29 Overweight Nutritional Risks: None Diabetes: Yes  Lab Results  Component Value Date   HGBA1C 5.8 (H) 09/09/2023   HGBA1C 5.7 (H) 03/17/2023   HGBA1C 6.7 (H) 11/03/2022     How often do you need to have someone help you when you read instructions, pamphlets, or other written materials from your doctor or pharmacy?: 3 - Sometimes (pts daughter)  Interpreter Needed?: No  Information entered by :: alia t/cma   Activities of Daily Living     02/27/2024   11:44 AM  In your present state of health, do you have any difficulty performing the following activities:  Hearing? 1  Vision? 0  Difficulty concentrating or making decisions? 0  Walking or climbing stairs? 0  Dressing or bathing? 0  Doing errands, shopping? 0  Preparing Food and eating ? N  Using the Toilet? N  In the past six months, have you accidently leaked urine? Y  Do you have problems with loss of bowel control? Y  Managing your Medications? N  Managing your Finances? N  Housekeeping or managing your Housekeeping? N    Patient Care  Team: Gladis Mustard, FNP as PCP - General (Family Medicine) Raford Riggs, MD as PCP - Cardiology (Cardiology) Ladora Ross Lacy Phebe, MD as Referring Physician (Optometry)  I have updated your Care Teams any recent Medical Services you may have received from other providers in the past year.     Assessment:   This is a routine wellness examination for [place].  Hearing/Vision screen Hearing Screening - Comments:: Pt wear hearing aids Vision Screening - Comments:: Pt wears glasses/pt goes to [facility] in [place]/upcoming ov/over ago   Goals Addressed             This Visit's Progress    Exercise 150 min/wk Moderate Activity   On track    Walk with walker as much as possible Wants to get out more       Depression Screen     02/27/2024   11:50 AM 09/09/2023    2:15 PM 03/17/2023    2:18 PM 02/26/2023    1:31 PM 11/18/2022   11:45 AM 08/30/2022   11:45 AM 02/26/2022   12:13 PM  PHQ 2/9 Scores  PHQ - 2 Score 0 3 5 0 4 4 4   PHQ- 9 Score  14 20  12 12 15     Fall Risk     02/27/2024   11:38 AM 09/09/2023    2:15 PM 03/17/2023    2:17 PM 02/26/2023    1:29 PM 11/18/2022   11:45 AM  Fall Risk   Falls in the past year? 0 0 1 1 0  Number  falls in past yr: 0  0 1   Injury with Fall? 0  1 1   Risk for fall due to : No Fall Risks  History of fall(s) History of fall(s);Impaired balance/gait;Orthopedic patient   Follow up Falls evaluation completed  Education provided Education provided;Falls prevention discussed;Falls evaluation completed     MEDICARE RISK AT HOME:  Medicare Risk at Home Any stairs in or around the home?: Yes If so, are there any without handrails?: Yes Home free of loose throw rugs in walkways, pet beds, electrical cords, etc?: Yes Adequate lighting in your home to reduce risk of falls?: Yes Life alert?: No Use of a cane, walker or w/c?: Yes Grab bars in the bathroom?: Yes Shower chair or bench in shower?: Yes Elevated toilet seat or a handicapped  toilet?: Yes  TIMED UP AND GO:  Was the test performed?  no  Cognitive Function: 6CIT completed        02/27/2024   11:55 AM 02/26/2023    1:33 PM 02/05/2022    1:23 PM 02/02/2021    1:38 PM 01/31/2020    2:47 PM  6CIT Screen  What Year? 0 points 0 points 0 points 0 points 0 points  What month? 0 points 0 points 0 points 0 points 0 points  What time? 0 points 0 points 0 points 0 points 0 points  Count back from 20 0 points 0 points 0 points 0 points 0 points  Months in reverse 4 points 0 points 4 points 2 points 2 points  Repeat phrase 6 points 0 points 2 points 2 points 2 points  Total Score 10 points 0 points 6 points 4 points 4 points    Immunizations Immunization History  Administered Date(s) Administered   Fluad Quad(high Dose 65+) 04/06/2019, 03/23/2020, 05/21/2021, 05/31/2022   Fluad Trivalent(High Dose 65+) 03/17/2023   INFLUENZA, HIGH DOSE SEASONAL PF 04/21/2017, 04/06/2018   Influenza,inj,Quad PF,6+ Mos 04/02/2013, 04/06/2014, 04/14/2015, 04/05/2016   Influenza,inj,quad, With Preservative 03/31/2017, 03/31/2018   Moderna Sars-Covid-2 Vaccination 08/02/2019, 08/29/2019   Pneumococcal Conjugate-13 11/24/2014   Pneumococcal Polysaccharide-23 06/08/2003, 10/12/2010   Pneumococcal-Unspecified 03/31/2017   Td 10/12/2010   Tdap 10/12/2010   Zoster Recombinant(Shingrix) 02/03/2020, 08/13/2021    Screening Tests Health Maintenance  Topic Date Due   DEXA SCAN  04/10/2018   OPHTHALMOLOGY EXAM  05/26/2019   FOOT EXAM  02/08/2022   COVID-19 Vaccine (3 - 2024-25 season) 03/02/2023   INFLUENZA VACCINE  01/30/2024   Diabetic kidney evaluation - Urine ACR  03/16/2024   DTaP/Tdap/Td (3 - Td or Tdap) 09/08/2024 (Originally 10/11/2020)   HEMOGLOBIN A1C  03/11/2024   Diabetic kidney evaluation - eGFR measurement  09/08/2024   Medicare Annual Wellness (AWV)  02/26/2025   Pneumococcal Vaccine: 50+ Years  Completed   Zoster Vaccines- Shingrix  Completed   HPV VACCINES  Aged Out    Meningococcal B Vaccine  Aged Out    Health Maintenance  Health Maintenance Due  Topic Date Due   DEXA SCAN  04/10/2018   OPHTHALMOLOGY EXAM  05/26/2019   FOOT EXAM  02/08/2022   COVID-19 Vaccine (3 - 2024-25 season) 03/02/2023   INFLUENZA VACCINE  01/30/2024   Diabetic kidney evaluation - Urine ACR  03/16/2024   Health Maintenance Items Addressed: See Nurse Notes at the end of this note  Additional Screening:  Vision Screening: Recommended annual ophthalmology exams for early detection of glaucoma and other disorders of the eye. Would you like a referral to an eye doctor? No  Dental Screening: Recommended annual dental exams for proper oral hygiene  Community Resource Referral / Chronic Care Management: CRR required this visit?  No   CCM required this visit?  No   Plan:    I have personally reviewed and noted the following in the patient's chart:   Medical and social history Use of alcohol, tobacco or illicit drugs  Current medications and supplements including opioid prescriptions. Patient is not currently taking opioid prescriptions. Functional ability and status Nutritional status Physical activity Advanced directives List of other physicians Hospitalizations, surgeries, and ER visits in previous 12 months Vitals Screenings to include cognitive, depression, and falls Referrals and appointments  In addition, I have reviewed and discussed with patient certain preventive protocols, quality metrics, and best practice recommendations. A written personalized care plan for preventive services as well as general preventive health recommendations were provided to patient.   Ozie Ned, CMA   02/27/2024   After Visit Summary: (MyChart) Due to this being a telephonic visit, the after visit summary with patients personalized plan was offered to patient via MyChart   Notes: PCP Follow Up Recommendations: pt is aware and due the following: dexa--in 10/25--too early to  ordered, UrineACR, Foot exa and flu vaccine, will get it done at the next ov w/pcp; pt is also going to make appt for diabetic exam.

## 2024-03-02 DIAGNOSIS — E1142 Type 2 diabetes mellitus with diabetic polyneuropathy: Secondary | ICD-10-CM | POA: Diagnosis not present

## 2024-03-02 DIAGNOSIS — L923 Foreign body granuloma of the skin and subcutaneous tissue: Secondary | ICD-10-CM | POA: Diagnosis not present

## 2024-03-03 ENCOUNTER — Other Ambulatory Visit: Payer: Self-pay | Admitting: Nurse Practitioner

## 2024-03-03 DIAGNOSIS — E119 Type 2 diabetes mellitus without complications: Secondary | ICD-10-CM

## 2024-03-08 ENCOUNTER — Other Ambulatory Visit: Payer: Self-pay | Admitting: Nurse Practitioner

## 2024-03-08 DIAGNOSIS — E1169 Type 2 diabetes mellitus with other specified complication: Secondary | ICD-10-CM

## 2024-03-09 ENCOUNTER — Encounter: Payer: Self-pay | Admitting: Nurse Practitioner

## 2024-03-09 ENCOUNTER — Ambulatory Visit: Admitting: Nurse Practitioner

## 2024-03-09 VITALS — BP 172/87 | HR 65 | Temp 97.3°F | Ht 70.0 in | Wt 189.8 lb

## 2024-03-09 DIAGNOSIS — K219 Gastro-esophageal reflux disease without esophagitis: Secondary | ICD-10-CM | POA: Diagnosis not present

## 2024-03-09 DIAGNOSIS — I10 Essential (primary) hypertension: Secondary | ICD-10-CM | POA: Diagnosis not present

## 2024-03-09 DIAGNOSIS — F339 Major depressive disorder, recurrent, unspecified: Secondary | ICD-10-CM

## 2024-03-09 DIAGNOSIS — Z7984 Long term (current) use of oral hypoglycemic drugs: Secondary | ICD-10-CM

## 2024-03-09 DIAGNOSIS — M818 Other osteoporosis without current pathological fracture: Secondary | ICD-10-CM

## 2024-03-09 DIAGNOSIS — Z23 Encounter for immunization: Secondary | ICD-10-CM | POA: Diagnosis not present

## 2024-03-09 DIAGNOSIS — J410 Simple chronic bronchitis: Secondary | ICD-10-CM

## 2024-03-09 DIAGNOSIS — E1169 Type 2 diabetes mellitus with other specified complication: Secondary | ICD-10-CM

## 2024-03-09 DIAGNOSIS — J9611 Chronic respiratory failure with hypoxia: Secondary | ICD-10-CM

## 2024-03-09 DIAGNOSIS — E119 Type 2 diabetes mellitus without complications: Secondary | ICD-10-CM | POA: Diagnosis not present

## 2024-03-09 DIAGNOSIS — F411 Generalized anxiety disorder: Secondary | ICD-10-CM

## 2024-03-09 DIAGNOSIS — E663 Overweight: Secondary | ICD-10-CM

## 2024-03-09 DIAGNOSIS — E1165 Type 2 diabetes mellitus with hyperglycemia: Secondary | ICD-10-CM

## 2024-03-09 DIAGNOSIS — E876 Hypokalemia: Secondary | ICD-10-CM

## 2024-03-09 DIAGNOSIS — E785 Hyperlipidemia, unspecified: Secondary | ICD-10-CM

## 2024-03-09 DIAGNOSIS — E559 Vitamin D deficiency, unspecified: Secondary | ICD-10-CM

## 2024-03-09 LAB — BAYER DCA HB A1C WAIVED: HB A1C (BAYER DCA - WAIVED): 5.7 % — ABNORMAL HIGH (ref 4.8–5.6)

## 2024-03-09 LAB — LIPID PANEL

## 2024-03-09 MED ORDER — HYDROCHLOROTHIAZIDE 12.5 MG PO TABS: 12.5000 mg | ORAL_TABLET | Freq: Every day | ORAL | 1 refills | Status: DC

## 2024-03-09 MED ORDER — FENOFIBRATE 145 MG PO TABS: 145.0000 mg | ORAL_TABLET | Freq: Every day | ORAL | 1 refills | Status: DC

## 2024-03-09 MED ORDER — AMLODIPINE BESY-BENAZEPRIL HCL 10-40 MG PO CAPS: 1.0000 | ORAL_CAPSULE | Freq: Every day | ORAL | 1 refills | Status: DC

## 2024-03-09 MED ORDER — ROSUVASTATIN CALCIUM 40 MG PO TABS: ORAL_TABLET | ORAL | 1 refills | Status: DC

## 2024-03-09 MED ORDER — TRAMADOL HCL 50 MG PO TABS: 50.0000 mg | ORAL_TABLET | Freq: Two times a day (BID) | ORAL | 2 refills | Status: DC | PRN

## 2024-03-09 MED ORDER — LORAZEPAM 1 MG PO TABS: 1.0000 mg | ORAL_TABLET | Freq: Two times a day (BID) | ORAL | 5 refills | Status: DC | PRN

## 2024-03-09 MED ORDER — CITALOPRAM HYDROBROMIDE 20 MG PO TABS: 20.0000 mg | ORAL_TABLET | Freq: Every day | ORAL | 1 refills | Status: DC

## 2024-03-09 MED ORDER — PANTOPRAZOLE SODIUM 40 MG PO TBEC: 40.0000 mg | DELAYED_RELEASE_TABLET | Freq: Every day | ORAL | 1 refills | Status: DC

## 2024-03-09 MED ORDER — METFORMIN HCL ER 500 MG PO TB24: 500.0000 mg | ORAL_TABLET | Freq: Two times a day (BID) | ORAL | 1 refills | Status: DC

## 2024-03-09 NOTE — Patient Instructions (Signed)
 Fall Prevention in the Home, Adult Falls can cause injuries and can happen to people of all ages. There are many things you can do to make your home safer and to help prevent falls. What actions can I take to prevent falls? General information Use good lighting in all rooms. Make sure to: Replace any light bulbs that burn out. Turn on the lights in dark areas and use night-lights. Keep items that you use often in easy-to-reach places. Lower the shelves around your home if needed. Move furniture so that there are clear paths around it. Do not use throw rugs or other things on the floor that can make you trip. If any of your floors are uneven, fix them. Add color or contrast paint or tape to clearly mark and help you see: Grab bars or handrails. First and last steps of staircases. Where the edge of each step is. If you use a ladder or stepladder: Make sure that it is fully opened. Do not climb a closed ladder. Make sure the sides of the ladder are locked in place. Have someone hold the ladder while you use it. Know where your pets are as you move through your home. What can I do in the bathroom?     Keep the floor dry. Clean up any water on the floor right away. Remove soap buildup in the bathtub or shower. Buildup makes bathtubs and showers slippery. Use non-skid mats or decals on the floor of the bathtub or shower. Attach bath mats securely with double-sided, non-slip rug tape. If you need to sit down in the shower, use a non-slip stool. Install grab bars by the toilet and in the bathtub and shower. Do not use towel bars as grab bars. What can I do in the bedroom? Make sure that you have a light by your bed that is easy to reach. Do not use any sheets or blankets on your bed that hang to the floor. Have a firm chair or bench with side arms that you can use for support when you get dressed. What can I do in the kitchen? Clean up any spills right away. If you need to reach something  above you, use a step stool with a grab bar. Keep electrical cords out of the way. Do not use floor polish or wax that makes floors slippery. What can I do with my stairs? Do not leave anything on the stairs. Make sure that you have a light switch at the top and the bottom of the stairs. Make sure that there are handrails on both sides of the stairs. Fix handrails that are broken or loose. Install non-slip stair treads on all your stairs if they do not have carpet. Avoid having throw rugs at the top or bottom of the stairs. Choose a carpet that does not hide the edge of the steps on the stairs. Make sure that the carpet is firmly attached to the stairs. Fix carpet that is loose or worn. What can I do on the outside of my home? Use bright outdoor lighting. Fix the edges of walkways and driveways and fix any cracks. Clear paths of anything that can make you trip, such as tools or rocks. Add color or contrast paint or tape to clearly mark and help you see anything that might make you trip as you walk through a door, such as a raised step or threshold. Trim any bushes or trees on paths to your home. Check to see if handrails are loose  or broken and that both sides of all steps have handrails. Install guardrails along the edges of any raised decks and porches. Have leaves, snow, or ice cleared regularly. Use sand, salt, or ice melter on paths if you live where there is ice and snow during the winter. Clean up any spills in your garage right away. This includes grease or oil spills. What other actions can I take? Review your medicines with your doctor. Some medicines can cause dizziness or changes in blood pressure, which increase your risk of falling. Wear shoes that: Have a low heel. Do not wear high heels. Have rubber bottoms and are closed at the toe. Feel good on your feet and fit well. Use tools that help you move around if needed. These include: Canes. Walkers. Scooters. Crutches. Ask  your doctor what else you can do to help prevent falls. This may include seeing a physical therapist to learn to do exercises to move better and get stronger. Where to find more information Centers for Disease Control and Prevention, STEADI: TonerPromos.no [employer] on Aging: BaseRingTones.pl National Institute on Aging: BaseRingTones.pl Contact a doctor if: You are afraid of falling at home. You feel weak, drowsy, or dizzy at home. You fall at home. Get help right away if you: Lose consciousness or have trouble moving after a fall. Have a fall that causes a head injury. These symptoms may be an emergency. Get help right away. Call 911. Do not wait to see if the symptoms will go away. Do not drive yourself to the hospital. This information is not intended to replace advice given to you by your health care provider. Make sure you discuss any questions you have with your health care provider. Document Revised: 02/18/2022 Document Reviewed: 02/18/2022 Elsevier Patient Education  2024 [employer].

## 2024-03-09 NOTE — Progress Notes (Signed)
 Subjective:    Patient ID: Caitlin Mccarthy, female    DOB: 1935/07/22, 88 y.o.   MRN: [id number]   Chief Complaint: medical management of chronic issues     HPI:  Caitlin Mccarthy is a 88 y.o. who identifies as a female who was assigned female at birth.   Social history: Lives with: husband- her daughters are there most of the time Work history: retired   Water engineer in today for follow up of the following chronic medical issues:  1. Essential hypertension, benign No c/o chest pain, sob or headache. Does check blood pressure at home occasionally. Runs around 140 systolic BP Readings from Last 3 Encounters:  02/27/24 (!) 143/79  09/09/23 (!) 143/79  07/21/23 134/74     2. Chronic respiratory failure with hypoxia (HCC) 3. Simple chronic bronchitis (HCC) No issues. No cough. Not on oxygen  anymore  4. Gastroesophageal reflux disease without esophagitis Uses OTC meds when needed  5. Hyperlipidemia due to type 2 diabetes mellitus (HCC) Eats whatever her family makes her to eat. Lab Results  Component Value Date   CHOL 154 09/09/2023   HDL 45 09/09/2023   LDLCALC 84 09/09/2023   TRIG 143 09/09/2023   CHOLHDL 3.4 09/09/2023     6. Type 2 diabetes mellitus with hyperglycemia, without long-term current use of insulin  (HCC) Bllod sugars running 120's on average Lab Results  Component Value Date   HGBA1C 5.8 (H) 09/09/2023     7. Episode of recurrent major depressive disorder, unspecified depression episode severity (HCC) Is on no antidepressant.  Is doing okay    03/09/2024    2:26 PM 02/27/2024   11:50 AM 09/09/2023    2:15 PM  Depression screen PHQ 2/9  Decreased Interest 0 0 2  Down, Depressed, Hopeless 0 0 1  PHQ - 2 Score 0 0 3  Altered sleeping 0  3  Tired, decreased energy 0  2  Change in appetite 0  2  Feeling bad or failure about yourself  0  1  Trouble concentrating 0  2  Moving slowly or fidgety/restless 0  1  Suicidal thoughts 0  0  PHQ-9 Score 0   14  Difficult doing work/chores Not difficult at all  Somewhat difficult      8. GAD (generalized anxiety disorder) Has had ativan  in the past. Takes at night.     09/09/2023    2:16 PM 03/17/2023    2:19 PM 11/18/2022   11:45 AM 08/30/2022   11:47 AM  GAD 7 : Generalized Anxiety Score  Nervous, Anxious, on Edge 2 3 2 2   Control/stop worrying 2 2 2 2   Worry too much - different things 2 3 2 2   Trouble relaxing 2 3 3 3   Restless 2 2 1 2   Easily annoyed or irritable 1 3 2 3   Afraid - awful might happen 1 2 2 2   Total GAD 7 Score 12 18 14 16   Anxiety Difficulty Somewhat difficult Somewhat difficult Somewhat difficult Somewhat difficult      9. History of cerebrovascular accident (CVA) No permanent effects. Is no longer on eliquis   10. Vitamin D  deficiency Is on daily vitamin d  suppelement  11. Hypokalemia No c/o muscle cramps Lab Results  Component Value Date   K 4.8 09/09/2023     12. Other osteoporosis without current pathological fracture Is no longer doing bone density tests  13. Overweight (BMI 25.0-29.9) Weight is unchanged Wt Readings from Last 3 Encounters:  02/27/24  186 lb (84.4 kg)  09/09/23 186 lb (84.4 kg)  07/21/23 187 lb (84.8 kg)   BMI Readings from Last 3 Encounters:  03/09/24 26.69 kg/m  02/27/24 26.69 kg/m  09/09/23 26.69 kg/m      New complaints: None today  Allergies  Allergen Reactions   Gabapentin  Other (See Comments)    Confusion and somnolence    Latex     Other Reaction(s): Not available   Outpatient Encounter Medications as of 03/09/2024  Medication Sig   acetaminophen  (TYLENOL ) 650 MG CR tablet Take 650 mg by mouth every 8 (eight) hours as needed for pain.   albuterol  (VENTOLIN  HFA) 108 (90 Base) MCG/ACT inhaler Inhale 2 puffs into the lungs every 6 (six) hours as needed for wheezing or shortness of breath.   Alcohol Swabs (B-D SINGLE USE SWABS REGULAR) PADS Test BS daily and as needed Dx E11.65   amLODipine -benazepril   (LOTREL) 10-40 MG capsule Take 1 capsule by mouth daily.   aspirin  EC 81 MG tablet Take 1 tablet (81 mg total) by mouth daily.   baclofen  (LIORESAL ) 10 MG tablet Take 1 tablet (10 mg total) by mouth 3 (three) times daily.   Blood Glucose Calibration (TRUE METRIX LEVEL 1) Low SOLN Use with glucose monitor Dx E11.65   Blood Glucose Monitoring Suppl (TRUE METRIX AIR GLUCOSE METER) w/Device KIT Test BS daily and as needed Dx E11.65   citalopram  (CELEXA ) 20 MG tablet Take 1 tablet (20 mg total) by mouth daily.   fenofibrate  (TRICOR ) 145 MG tablet TAKE ONE (1) TABLET BY MOUTH EVERY DAY   glucose blood (TRUE METRIX BLOOD GLUCOSE TEST) test strip Test BS daily and as needed Dx E11.65   hydrochlorothiazide  (HYDRODIURIL ) 12.5 MG tablet Take 1 tablet (12.5 mg total) by mouth daily.   ibuprofen (ADVIL) 200 MG tablet Take 200 mg by mouth every 6 (six) hours as needed.   icosapent  Ethyl (VASCEPA ) 1 g capsule Take 2 capsules (2 g total) by mouth 2 (two) times daily.   LORazepam  (ATIVAN ) 0.5 MG tablet Take 1 tablet (0.5 mg total) by mouth at bedtime. for anxiety   metFORMIN  (GLUCOPHAGE -XR) 500 MG 24 hr tablet TAKE 1 TABLET BY MOUTH TWICE DAILY WITH MEALS   nitrofurantoin , macrocrystal-monohydrate, (MACROBID ) 100 MG capsule Take 1 capsule (100 mg total) by mouth 2 (two) times daily. X 7 days   nystatin  (MYCOSTATIN /NYSTOP ) powder APPLY TOPICALLY FOUR TIMES DAILY (Patient taking differently: Apply 1 Application topically 4 (four) times daily.)   ondansetron  (ZOFRAN -ODT) 4 MG disintegrating tablet TAKE 1 TABLET EVERY 8 HOURS AS NEEDED FOR NAUSEA AND VOMITING   pantoprazole  (PROTONIX ) 40 MG tablet TAKE ONE (1) TABLET BY MOUTH EVERY DAY   polyethylene glycol (MIRALAX  / GLYCOLAX ) 17 g packet Take 17 g by mouth daily.   PREMARIN vaginal cream Apply 1 application topically daily as needed (irritation).    rosuvastatin  (CRESTOR ) 40 MG tablet TAKE ONE (1) TABLET EACH DAY   traMADol  (ULTRAM ) 50 MG tablet Take 1 tablet (50  mg total) by mouth every 12 (twelve) hours as needed.   TRUEplus Lancets 33G MISC Test BS daily and as needed Dx E11.65   Vitamin D , Ergocalciferol , (DRISDOL ) 1.25 MG (50000 UNIT) CAPS capsule Take 1 capsule (50,000 Units total) by mouth every 7 (seven) days.   No facility-administered encounter medications on file as of 03/09/2024.    Past Surgical History:  Procedure Laterality Date   CATARACT EXTRACTION W/PHACO  06/06/2011   Procedure: CATARACT EXTRACTION PHACO AND INTRAOCULAR LENS PLACEMENT (  IOC);  Surgeon: Cherene Mania;  Location: [facility];  Service: Ophthalmology;  Laterality: Right;  CDE=12.77   CATARACT EXTRACTION W/PHACO  06/27/2011   Procedure: CATARACT EXTRACTION PHACO AND INTRAOCULAR LENS PLACEMENT (IOC);  Surgeon: Cherene Mania;  Location: [facility];  Service: Ophthalmology;  Laterality: Left;  CDE:13.96   CYSTOSCOPY WITH URETEROSCOPY AND STENT PLACEMENT Right 04/27/2015   Procedure: CYSTOSCOPY WITH RIGHT URETEROSCOPY, BASKET REMOVAL OF STONE, REMOVAL OF RIGHT NEPHROSTOMY TUBE;  Surgeon: Norleen Seltzer, MD;  Location: [facility];  Service: Urology;  Laterality: Right;   CYSTOSCOPY/URETEROSCOPY/HOLMIUM LASER/STENT PLACEMENT Left 12/24/2016   Procedure: LEFT URETEROSCOPY WITH HOLMIUM LASER AND STENT PLACEMENT;  Surgeon: Seltzer Norleen, MD;  Location: [facility];  Service: Urology;  Laterality: Left;   EXTRACORPOREAL SHOCK WAVE LITHOTRIPSY  left 04-01-2016;  1980's   FRACTURE SURGERY     HIP ARTHROPLASTY     HOLMIUM LASER APPLICATION Right 04/27/2015   Procedure: HOLMIUM LASER APPLICATION;  Surgeon: Norleen Seltzer, MD;  Location: [facility];  Service: Urology;  Laterality: Right;   KNEE ARTHROPLASTY     KNEE ARTHROSCOPY Bilateral right 2005//  left ?   LOOP RECORDER INSERTION N/A 04/01/2017   Procedure: LOOP RECORDER INSERTION;  Surgeon: Kelsie Agent, MD;  Location: [facility];  Service: Cardiovascular;  Laterality: N/A;   ORIF FEMUR FRACTURE Right  11/27/2022   Procedure: OPEN REDUCTION INTERNAL FIXATION (ORIF) DISTAL FEMUR FRACTURE;  Surgeon: Kendal Franky SQUIBB, MD;  Location: [facility];  Service: Orthopedics;  Laterality: Right;   TOTAL HIP ARTHROPLASTY  10/28/2011   Procedure: TOTAL HIP ARTHROPLASTY;  Surgeon: Dempsey LULLA Moan, MD;  Location: [facility];  Service: Orthopedics;  Laterality: Right;   TOTAL KNEE ARTHROPLASTY Bilateral left 03-09-2007//  right 2006   TRANSTHORACIC ECHOCARDIOGRAM  10/06/2006   normal echo,  ef 55-60%   TUBAL LIGATION      Family History  Problem Relation Age of Onset   Kidney disease Mother    Diabetes Mother    Congestive Heart Failure Father    Heart failure Father    Heart disease Father    Heart disease Brother    Alcohol abuse Brother    Anesthesia problems Neg Hx    Hypotension Neg Hx    Malignant hyperthermia Neg Hx    Pseudochol deficiency Neg Hx       Controlled substance contract: 09/03/22     Review of Systems  Constitutional:  Negative for diaphoresis.  Eyes:  Negative for pain.  Respiratory:  Negative for shortness of breath.   Cardiovascular:  Negative for chest pain, palpitations and leg swelling.  Gastrointestinal:  Negative for abdominal pain.  Endocrine: Negative for polydipsia.  Skin:  Negative for rash.  Neurological:  Negative for dizziness, weakness and headaches.  Hematological:  Does not bruise/bleed easily.  All other systems reviewed and are negative.      Objective:   Physical Exam Vitals and nursing note reviewed.  Constitutional:      General: She is not in acute distress.    Appearance: Normal appearance. She is well-developed.  HENT:     Head: Normocephalic.     Right Ear: Tympanic membrane normal.     Left Ear: Tympanic membrane normal.     Nose: Nose normal.     Mouth/Throat:     Mouth: Mucous membranes are moist.  Eyes:     Pupils: Pupils are equal, round, and reactive to light.  Neck:     Vascular: No  carotid bruit or JVD.  Cardiovascular:      Rate and Rhythm: Normal rate and regular rhythm.     Heart sounds: Normal heart sounds.  Pulmonary:     Effort: Pulmonary effort is normal. No respiratory distress.     Breath sounds: Normal breath sounds. No wheezing or rales.  Chest:     Chest wall: No tenderness.  Abdominal:     General: Bowel sounds are normal. There is no distension or abdominal bruit.     Palpations: Abdomen is soft. There is no hepatomegaly, splenomegaly, mass or pulsatile mass.     Tenderness: There is no abdominal tenderness.  Musculoskeletal:        General: Normal range of motion.     Cervical back: Normal range of motion and neck supple.  Lymphadenopathy:     Cervical: No cervical adenopathy.  Skin:    General: Skin is warm and dry.  Neurological:     Mental Status: She is alert and oriented to person, place, and time.     Deep Tendon Reflexes: Reflexes are normal and symmetric.  Psychiatric:        Behavior: Behavior normal.        Thought Content: Thought content normal.        Judgment: Judgment normal.     BP (!) 172/87   Pulse 65   Temp (!) 97.3 F (36.3 C)   Ht 5' 10 (1.778 m)   Wt 189 lb 12.8 oz (86.1 kg)   SpO2 96%   BMI 27.23 kg/m     HGBA1c 5.7%     Assessment & Plan:   Caitlin Mccarthy comes in today with chief complaint of Medical Management of Chronic Issues   Diagnosis and orders addressed:  1. Essential hypertension, benign Low sodium diet Keep diary of blood pressure at home - CBC with Differential/Platelet - CMP14+EGFR  2. Chronic respiratory failure with hypoxia (HCC) 3. Simple chronic bronchitis (HCC) Report any respiratory issues  4. Gastroesophageal reflux disease without esophagitis Avoid spicy foods Do not eat 2 hours prior to bedtime   5. Hyperlipidemia due to type 2 diabetes mellitus (HCC) Low fat diet - Lipid panel  6. Diabetes mellitus treated with oral medication (HCC) Continue to watch carbs in diet - Bayer DCA Hb A1c Waived -  Microalbumin / creatinine urine ratio  7. Episode of recurrent major depressive disorder, unspecified depression episode severity (HCC) Stress management Will start  on celexa  - citalopram  (CELEXA ) 20 MG tablet; Take 1 tablet (20 mg total) by mouth daily.  Dispense: 30 tablet; Refill: 5  8. GAD (generalized anxiety disorder) Continue ativan  at night- increased to 1mg  qhs  9. Late effect of cerebrovascular accident (CVA)  10. Vitamin D  deficiency Daily vitamin d  supplement  11. Hypokalemia Labs pending  12. Other osteoporosis without current pathological fracture Fall prevention  13. Overweight (BMI 25.0-29.9) Discussed diet and exercise for person with BMI >25 Will recheck weight in 3-6 months    Labs pending Health Maintenance reviewed Diet and exercise encouraged  Follow up plan: 6 months   Caitlin Lunger, FNP

## 2024-03-10 LAB — CBC WITH DIFFERENTIAL/PLATELET
Basophils Absolute: 0.1 x10E3/uL (ref 0.0–0.2)
Basos: 1 %
EOS (ABSOLUTE): 0.1 x10E3/uL (ref 0.0–0.4)
Eos: 2 %
Hematocrit: 41.9 % (ref 34.0–46.6)
Hemoglobin: 13.2 g/dL (ref 11.1–15.9)
Immature Grans (Abs): 0 x10E3/uL (ref 0.0–0.1)
Immature Granulocytes: 0 %
Lymphocytes Absolute: 3 x10E3/uL (ref 0.7–3.1)
Lymphs: 43 %
MCH: 31 pg (ref 26.6–33.0)
MCHC: 31.5 g/dL (ref 31.5–35.7)
MCV: 98 fL — ABNORMAL HIGH (ref 79–97)
Monocytes Absolute: 0.7 x10E3/uL (ref 0.1–0.9)
Monocytes: 10 %
Neutrophils Absolute: 3 x10E3/uL (ref 1.4–7.0)
Neutrophils: 44 %
Platelets: 209 x10E3/uL (ref 150–450)
RBC: 4.26 x10E6/uL (ref 3.77–5.28)
RDW: 12.6 % (ref 11.7–15.4)
WBC: 6.8 x10E3/uL (ref 3.4–10.8)

## 2024-03-10 LAB — CMP14+EGFR
ALT: 20 IU/L (ref 0–32)
AST: 33 IU/L (ref 0–40)
Albumin: 4.2 g/dL (ref 3.7–4.7)
Alkaline Phosphatase: 58 IU/L (ref 44–121)
BUN/Creatinine Ratio: 28 (ref 12–28)
BUN: 27 mg/dL (ref 8–27)
Bilirubin Total: 0.3 mg/dL (ref 0.0–1.2)
CO2: 20 mmol/L (ref 20–29)
Calcium: 11.4 mg/dL — AB (ref 8.7–10.3)
Chloride: 107 mmol/L — AB (ref 96–106)
Creatinine, Ser: 0.96 mg/dL (ref 0.57–1.00)
Globulin, Total: 2.9 g/dL (ref 1.5–4.5)
Glucose: 89 mg/dL (ref 70–99)
Potassium: 4.7 mmol/L (ref 3.5–5.2)
Sodium: 142 mmol/L (ref 134–144)
Total Protein: 7.1 g/dL (ref 6.0–8.5)
eGFR: 58 mL/min/1.73 — AB (ref >59)

## 2024-03-10 LAB — MICROALBUMIN / CREATININE URINE RATIO
Creatinine, Urine: 55.1 mg/dL
Microalb/Creat Ratio: 585 mg/g{creat} — ABNORMAL HIGH (ref 0–29)
Microalbumin, Urine: 322.5 ug/mL

## 2024-03-10 LAB — LIPID PANEL
Cholesterol, Total: 160 mg/dL (ref 100–199)
HDL: 46 mg/dL (ref >39)
LDL CALC COMMENT:: 3.5 ratio (ref 0.0–4.4)
LDL Chol Calc (NIH): 76 mg/dL (ref 0–99)
Triglycerides: 230 mg/dL — AB (ref 0–149)
VLDL Cholesterol Cal: 38 mg/dL (ref 5–40)

## 2024-03-11 ENCOUNTER — Ambulatory Visit: Payer: Self-pay | Admitting: Nurse Practitioner

## 2024-03-12 ENCOUNTER — Ambulatory Visit: Admitting: Nurse Practitioner

## 2024-03-12 ENCOUNTER — Other Ambulatory Visit

## 2024-03-13 LAB — TOXASSURE SELECT 13 (MW), URINE

## 2024-03-15 ENCOUNTER — Telehealth: Payer: Self-pay

## 2024-03-15 NOTE — Telephone Encounter (Signed)
 Called and spoke with [facility] and explained lab results and she verbalized understanding. Requested that mother come back in 3 months instead of 6 to recheck diabetes and microalbumin. Appt scheduled for 3 months

## 2024-03-15 NOTE — Telephone Encounter (Signed)
 Copied from CRM #[id number]. Topic: Clinical - Lab/Test Results >> Mar 15, 2024 10:43 AM Diannia H wrote: Reason for CRM: Patient and her daughter Hunter is wanting a nurse or provider to give them a call about her lab. Could you assist? Patients callback number is [phone].

## 2024-04-01 DIAGNOSIS — H5203 Hypermetropia, bilateral: Secondary | ICD-10-CM | POA: Diagnosis not present

## 2024-05-11 DIAGNOSIS — L84 Corns and callosities: Secondary | ICD-10-CM | POA: Diagnosis not present

## 2024-05-11 DIAGNOSIS — M79674 Pain in right toe(s): Secondary | ICD-10-CM | POA: Diagnosis not present

## 2024-05-11 DIAGNOSIS — E1142 Type 2 diabetes mellitus with diabetic polyneuropathy: Secondary | ICD-10-CM | POA: Diagnosis not present

## 2024-05-11 DIAGNOSIS — B351 Tinea unguium: Secondary | ICD-10-CM | POA: Diagnosis not present

## 2024-05-11 DIAGNOSIS — M79675 Pain in left toe(s): Secondary | ICD-10-CM | POA: Diagnosis not present

## 2024-06-10 ENCOUNTER — Ambulatory Visit: Payer: Self-pay | Admitting: Nurse Practitioner

## 2024-06-10 ENCOUNTER — Encounter: Payer: Self-pay | Admitting: Nurse Practitioner

## 2024-06-10 VITALS — BP 144/72 | HR 53 | Temp 96.9°F | Ht 70.0 in | Wt 195.0 lb

## 2024-06-10 DIAGNOSIS — I10 Essential (primary) hypertension: Secondary | ICD-10-CM

## 2024-06-10 DIAGNOSIS — J9611 Chronic respiratory failure with hypoxia: Secondary | ICD-10-CM

## 2024-06-10 DIAGNOSIS — E559 Vitamin D deficiency, unspecified: Secondary | ICD-10-CM

## 2024-06-10 DIAGNOSIS — E119 Type 2 diabetes mellitus without complications: Secondary | ICD-10-CM

## 2024-06-10 DIAGNOSIS — K219 Gastro-esophageal reflux disease without esophagitis: Secondary | ICD-10-CM

## 2024-06-10 DIAGNOSIS — J410 Simple chronic bronchitis: Secondary | ICD-10-CM

## 2024-06-10 DIAGNOSIS — Z8673 Personal history of transient ischemic attack (TIA), and cerebral infarction without residual deficits: Secondary | ICD-10-CM

## 2024-06-10 DIAGNOSIS — F339 Major depressive disorder, recurrent, unspecified: Secondary | ICD-10-CM

## 2024-06-10 DIAGNOSIS — F411 Generalized anxiety disorder: Secondary | ICD-10-CM

## 2024-06-10 DIAGNOSIS — E1169 Type 2 diabetes mellitus with other specified complication: Secondary | ICD-10-CM

## 2024-06-10 DIAGNOSIS — E876 Hypokalemia: Secondary | ICD-10-CM

## 2024-06-10 DIAGNOSIS — E663 Overweight: Secondary | ICD-10-CM

## 2024-06-10 DIAGNOSIS — M818 Other osteoporosis without current pathological fracture: Secondary | ICD-10-CM

## 2024-06-10 LAB — BAYER DCA HB A1C WAIVED: HB A1C (BAYER DCA - WAIVED): 5.7 % — ABNORMAL HIGH (ref 4.8–5.6)

## 2024-06-10 MED ORDER — FENOFIBRATE 145 MG PO TABS: 145.0000 mg | ORAL_TABLET | Freq: Every day | ORAL | 1 refills | Status: AC

## 2024-06-10 MED ORDER — LORAZEPAM 1 MG PO TABS: 1.0000 mg | ORAL_TABLET | Freq: Two times a day (BID) | ORAL | 5 refills | Status: AC | PRN

## 2024-06-10 MED ORDER — METFORMIN HCL ER 500 MG PO TB24: 500.0000 mg | ORAL_TABLET | Freq: Two times a day (BID) | ORAL | 1 refills | Status: AC

## 2024-06-10 MED ORDER — AMLODIPINE BESY-BENAZEPRIL HCL 10-40 MG PO CAPS: 1.0000 | ORAL_CAPSULE | Freq: Every day | ORAL | 1 refills | Status: AC

## 2024-06-10 MED ORDER — TRAMADOL HCL 50 MG PO TABS: 50.0000 mg | ORAL_TABLET | Freq: Two times a day (BID) | ORAL | 2 refills | Status: AC | PRN

## 2024-06-10 MED ORDER — HYDROCHLOROTHIAZIDE 12.5 MG PO TABS: 12.5000 mg | ORAL_TABLET | Freq: Every day | ORAL | 1 refills | Status: AC

## 2024-06-10 MED ORDER — CITALOPRAM HYDROBROMIDE 20 MG PO TABS: 20.0000 mg | ORAL_TABLET | Freq: Every day | ORAL | 1 refills | Status: AC

## 2024-06-10 MED ORDER — PANTOPRAZOLE SODIUM 40 MG PO TBEC: 40.0000 mg | DELAYED_RELEASE_TABLET | Freq: Every day | ORAL | 1 refills | Status: AC

## 2024-06-10 MED ORDER — ROSUVASTATIN CALCIUM 40 MG PO TABS: ORAL_TABLET | ORAL | 1 refills | Status: AC

## 2024-06-10 NOTE — Addendum Note (Signed)
 Addended by: Vaeda Westall, MARY-MARGARET on: 06/10/2024 02:43 PM   Modules accepted: Orders

## 2024-06-10 NOTE — Patient Instructions (Signed)
 Fall Prevention in the Home, Adult Falls can cause injuries and can happen to people of all ages. There are many things you can do to make your home safer and to help prevent falls. What actions can I take to prevent falls? General information Use good lighting in all rooms. Make sure to: Replace any light bulbs that burn out. Turn on the lights in dark areas and use night-lights. Keep items that you use often in easy-to-reach places. Lower the shelves around your home if needed. Move furniture so that there are clear paths around it. Do not use throw rugs or other things on the floor that can make you trip. If any of your floors are uneven, fix them. Add color or contrast paint or tape to clearly mark and help you see: Grab bars or handrails. First and last steps of staircases. Where the edge of each step is. If you use a ladder or stepladder: Make sure that it is fully opened. Do not climb a closed ladder. Make sure the sides of the ladder are locked in place. Have someone hold the ladder while you use it. Know where your pets are as you move through your home. What can I do in the bathroom?     Keep the floor dry. Clean up any water on the floor right away. Remove soap buildup in the bathtub or shower. Buildup makes bathtubs and showers slippery. Use non-skid mats or decals on the floor of the bathtub or shower. Attach bath mats securely with double-sided, non-slip rug tape. If you need to sit down in the shower, use a non-slip stool. Install grab bars by the toilet and in the bathtub and shower. Do not use towel bars as grab bars. What can I do in the bedroom? Make sure that you have a light by your bed that is easy to reach. Do not use any sheets or blankets on your bed that hang to the floor. Have a firm chair or bench with side arms that you can use for support when you get dressed. What can I do in the kitchen? Clean up any spills right away. If you need to reach something  above you, use a step stool with a grab bar. Keep electrical cords out of the way. Do not use floor polish or wax that makes floors slippery. What can I do with my stairs? Do not leave anything on the stairs. Make sure that you have a light switch at the top and the bottom of the stairs. Make sure that there are handrails on both sides of the stairs. Fix handrails that are broken or loose. Install non-slip stair treads on all your stairs if they do not have carpet. Avoid having throw rugs at the top or bottom of the stairs. Choose a carpet that does not hide the edge of the steps on the stairs. Make sure that the carpet is firmly attached to the stairs. Fix carpet that is loose or worn. What can I do on the outside of my home? Use bright outdoor lighting. Fix the edges of walkways and driveways and fix any cracks. Clear paths of anything that can make you trip, such as tools or rocks. Add color or contrast paint or tape to clearly mark and help you see anything that might make you trip as you walk through a door, such as a raised step or threshold. Trim any bushes or trees on paths to your home. Check to see if handrails are loose  or broken and that both sides of all steps have handrails. Install guardrails along the edges of any raised decks and porches. Have leaves, snow, or ice cleared regularly. Use sand, salt, or ice melter on paths if you live where there is ice and snow during the winter. Clean up any spills in your garage right away. This includes grease or oil spills. What other actions can I take? Review your medicines with your doctor. Some medicines can cause dizziness or changes in blood pressure, which increase your risk of falling. Wear shoes that: Have a low heel. Do not wear high heels. Have rubber bottoms and are closed at the toe. Feel good on your feet and fit well. Use tools that help you move around if needed. These include: Canes. Walkers. Scooters. Crutches. Ask  your doctor what else you can do to help prevent falls. This may include seeing a physical therapist to learn to do exercises to move better and get stronger. Where to find more information Centers for Disease Control and Prevention, STEADI: TonerPromos.no [employer] on Aging: BaseRingTones.pl National Institute on Aging: BaseRingTones.pl Contact a doctor if: You are afraid of falling at home. You feel weak, drowsy, or dizzy at home. You fall at home. Get help right away if you: Lose consciousness or have trouble moving after a fall. Have a fall that causes a head injury. These symptoms may be an emergency. Get help right away. Call 911. Do not wait to see if the symptoms will go away. Do not drive yourself to the hospital. This information is not intended to replace advice given to you by your health care provider. Make sure you discuss any questions you have with your health care provider. Document Revised: 02/18/2022 Document Reviewed: 02/18/2022 Elsevier Patient Education  2024 [employer].

## 2024-06-10 NOTE — Progress Notes (Signed)
 Subjective:    Patient ID: Caitlin Mccarthy, female    DOB: August 10, 1935, 88 y.o.   MRN: [id number]   Chief Complaint: medical management of chronic issues     HPI:  Caitlin KAMIYA is a 88 y.o. who identifies as a female who was assigned female at birth.   Social history: Lives with: husband- her daughters are there most of the time Work history: retired   Water Engineer in today for follow up of the following chronic medical issues:  1. Essential hypertension, benign No c/o chest pain, sob or headache. Does check blood pressure at home occasionally. Runs around 140 systolic BP Readings from Last 3 Encounters:  03/09/24 (!) 172/87  02/27/24 (!) 143/79  09/09/23 (!) 143/79     2. Chronic respiratory failure with hypoxia (HCC) 3. Simple chronic bronchitis (HCC) No issues. No cough. Not on oxygen  anymore  4. Gastroesophageal reflux disease without esophagitis Uses OTC meds when needed  5. Hyperlipidemia due to type 2 diabetes mellitus (HCC) Eats whatever her family makes her to eat. Lab Results  Component Value Date   CHOL 160 03/09/2024   HDL 46 03/09/2024   LDLCALC 76 03/09/2024   TRIG 230 (H) 03/09/2024   CHOLHDL 3.5 03/09/2024     6. Type 2 diabetes mellitus with hyperglycemia, without long-term current use of insulin  (HCC) Bllod sugars running 120's on average Lab Results  Component Value Date   HGBA1C 5.7 (H) 03/09/2024     7. Episode of recurrent major depressive disorder, unspecified depression episode severity (HCC) Is on no antidepressant.  Is doing okay    06/10/2024    2:18 PM 03/09/2024    2:26 PM 02/27/2024   11:50 AM  Depression screen PHQ 2/9  Decreased Interest 1 0 0  Down, Depressed, Hopeless 1 0 0  PHQ - 2 Score 2 0 0  Altered sleeping 1 0   Tired, decreased energy 1 0   Change in appetite 0 0   Feeling bad or failure about yourself  0 0   Trouble concentrating 1 0   Moving slowly or fidgety/restless 1 0   Suicidal thoughts 0 0   PHQ-9  Score 6 0    Difficult doing work/chores Somewhat difficult Not difficult at all      Data saved with a previous flowsheet row definition        8. GAD (generalized anxiety disorder) Has had ativan  in the past. Takes at night.    06/10/2024    2:19 PM 03/09/2024    2:27 PM 09/09/2023    2:16 PM 03/17/2023    2:19 PM  GAD 7 : Generalized Anxiety Score  Nervous, Anxious, on Edge 1 0 2 3  Control/stop worrying 1 0 2 2  Worry too much - different things 1 0 2 3  Trouble relaxing 1 0 2 3  Restless 0 0 2 2  Easily annoyed or irritable 0 0 1 3  Afraid - awful might happen 0 0 1 2  Total GAD 7 Score 4 0 12 18  Anxiety Difficulty Somewhat difficult Not difficult at all Somewhat difficult Somewhat difficult          9. History of cerebrovascular accident (CVA) No permanent effects. Is no longer on eliquis   10. Vitamin D  deficiency Is on daily vitamin d  suppelement  11. Hypokalemia No c/o muscle cramps Lab Results  Component Value Date   K 4.7 03/09/2024     12. Other osteoporosis without current pathological  fracture Is no longer doing bone density tests  13. Overweight (BMI 25.0-29.9) Weight is unchanged Wt Readings from Last 3 Encounters:  06/10/24 195 lb (88.5 kg)  03/09/24 189 lb 12.8 oz (86.1 kg)  02/27/24 186 lb (84.4 kg)   BMI Readings from Last 3 Encounters:  06/10/24 27.98 kg/m  03/09/24 27.23 kg/m  02/27/24 26.69 kg/m        New complaints: None today  Allergies  Allergen Reactions   Gabapentin  Other (See Comments)    Confusion and somnolence    Latex     Other Reaction(s): Not available   Outpatient Encounter Medications as of 06/10/2024  Medication Sig   acetaminophen  (TYLENOL ) 650 MG CR tablet Take 650 mg by mouth every 8 (eight) hours as needed for pain.   albuterol  (VENTOLIN  HFA) 108 (90 Base) MCG/ACT inhaler Inhale 2 puffs into the lungs every 6 (six) hours as needed for wheezing or shortness of breath.   Alcohol Swabs (B-D  SINGLE USE SWABS REGULAR) PADS Test BS daily and as needed Dx E11.65   amLODipine -benazepril  (LOTREL) 10-40 MG capsule Take 1 capsule by mouth daily.   aspirin  EC 81 MG tablet Take 1 tablet (81 mg total) by mouth daily.   baclofen  (LIORESAL ) 10 MG tablet Take 1 tablet (10 mg total) by mouth 3 (three) times daily.   Blood Glucose Calibration (TRUE METRIX LEVEL 1) Low SOLN Use with glucose monitor Dx E11.65   Blood Glucose Monitoring Suppl (TRUE METRIX AIR GLUCOSE METER) w/Device KIT Test BS daily and as needed Dx E11.65   citalopram  (CELEXA ) 20 MG tablet Take 1 tablet (20 mg total) by mouth daily.   fenofibrate  (TRICOR ) 145 MG tablet Take 1 tablet (145 mg total) by mouth daily.   glucose blood (TRUE METRIX BLOOD GLUCOSE TEST) test strip Test BS daily and as needed Dx E11.65   hydrochlorothiazide  (HYDRODIURIL ) 12.5 MG tablet Take 1 tablet (12.5 mg total) by mouth daily.   ibuprofen (ADVIL) 200 MG tablet Take 200 mg by mouth every 6 (six) hours as needed.   icosapent  Ethyl (VASCEPA ) 1 g capsule Take 2 capsules (2 g total) by mouth 2 (two) times daily.   LORazepam  (ATIVAN ) 1 MG tablet Take 1 tablet (1 mg total) by mouth 2 (two) times daily as needed for anxiety.   metFORMIN  (GLUCOPHAGE -XR) 500 MG 24 hr tablet Take 1 tablet (500 mg total) by mouth 2 (two) times daily with a meal.   nystatin  (MYCOSTATIN /NYSTOP ) powder APPLY TOPICALLY FOUR TIMES DAILY (Patient taking differently: Apply 1 Application topically 4 (four) times daily.)   ondansetron  (ZOFRAN -ODT) 4 MG disintegrating tablet TAKE 1 TABLET EVERY 8 HOURS AS NEEDED FOR NAUSEA AND VOMITING   pantoprazole  (PROTONIX ) 40 MG tablet Take 1 tablet (40 mg total) by mouth daily.   polyethylene glycol (MIRALAX  / GLYCOLAX ) 17 g packet Take 17 g by mouth daily.   PREMARIN vaginal cream Apply 1 application topically daily as needed (irritation).    rosuvastatin  (CRESTOR ) 40 MG tablet TAKE ONE (1) TABLET EACH DAY   traMADol  (ULTRAM ) 50 MG tablet Take 1 tablet  (50 mg total) by mouth every 12 (twelve) hours as needed.   TRUEplus Lancets 33G MISC Test BS daily and as needed Dx E11.65   Vitamin D , Ergocalciferol , (DRISDOL ) 1.25 MG (50000 UNIT) CAPS capsule Take 1 capsule (50,000 Units total) by mouth every 7 (seven) days.   No facility-administered encounter medications on file as of 06/10/2024.    Past Surgical History:  Procedure Laterality Date  CATARACT EXTRACTION W/PHACO  06/06/2011   Procedure: CATARACT EXTRACTION PHACO AND INTRAOCULAR LENS PLACEMENT (IOC);  Surgeon: Cherene Mania;  Location: [facility];  Service: Ophthalmology;  Laterality: Right;  CDE=12.77   CATARACT EXTRACTION W/PHACO  06/27/2011   Procedure: CATARACT EXTRACTION PHACO AND INTRAOCULAR LENS PLACEMENT (IOC);  Surgeon: Cherene Mania;  Location: [facility];  Service: Ophthalmology;  Laterality: Left;  CDE:13.96   CYSTOSCOPY WITH URETEROSCOPY AND STENT PLACEMENT Right 04/27/2015   Procedure: CYSTOSCOPY WITH RIGHT URETEROSCOPY, BASKET REMOVAL OF STONE, REMOVAL OF RIGHT NEPHROSTOMY TUBE;  Surgeon: Norleen Seltzer, MD;  Location: [facility];  Service: Urology;  Laterality: Right;   CYSTOSCOPY/URETEROSCOPY/HOLMIUM LASER/STENT PLACEMENT Left 12/24/2016   Procedure: LEFT URETEROSCOPY WITH HOLMIUM LASER AND STENT PLACEMENT;  Surgeon: Seltzer Norleen, MD;  Location: [facility];  Service: Urology;  Laterality: Left;   EXTRACORPOREAL SHOCK WAVE LITHOTRIPSY  left 04-01-2016;  1980's   FRACTURE SURGERY     HIP ARTHROPLASTY     HOLMIUM LASER APPLICATION Right 04/27/2015   Procedure: HOLMIUM LASER APPLICATION;  Surgeon: Norleen Seltzer, MD;  Location: [facility];  Service: Urology;  Laterality: Right;   KNEE ARTHROPLASTY     KNEE ARTHROSCOPY Bilateral right 2005//  left ?   LOOP RECORDER INSERTION N/A 04/01/2017   Procedure: LOOP RECORDER INSERTION;  Surgeon: Kelsie Agent, MD;  Location: [facility];  Service: Cardiovascular;  Laterality: N/A;   ORIF FEMUR FRACTURE  Right 11/27/2022   Procedure: OPEN REDUCTION INTERNAL FIXATION (ORIF) DISTAL FEMUR FRACTURE;  Surgeon: Kendal Franky SQUIBB, MD;  Location: [facility];  Service: Orthopedics;  Laterality: Right;   TOTAL HIP ARTHROPLASTY  10/28/2011   Procedure: TOTAL HIP ARTHROPLASTY;  Surgeon: Dempsey LULLA Moan, MD;  Location: [facility];  Service: Orthopedics;  Laterality: Right;   TOTAL KNEE ARTHROPLASTY Bilateral left 03-09-2007//  right 2006   TRANSTHORACIC ECHOCARDIOGRAM  10/06/2006   normal echo,  ef 55-60%   TUBAL LIGATION      Family History  Problem Relation Age of Onset   Kidney disease Mother    Diabetes Mother    Congestive Heart Failure Father    Heart failure Father    Heart disease Father    Heart disease Brother    Alcohol abuse Brother    Anesthesia problems Neg Hx    Hypotension Neg Hx    Malignant hyperthermia Neg Hx    Pseudochol deficiency Neg Hx       Controlled substance contract: 09/03/22     Review of Systems  Constitutional:  Negative for diaphoresis.  Eyes:  Negative for pain.  Respiratory:  Negative for shortness of breath.   Cardiovascular:  Negative for chest pain, palpitations and leg swelling.  Gastrointestinal:  Negative for abdominal pain.  Endocrine: Negative for polydipsia.  Skin:  Negative for rash.  Neurological:  Negative for dizziness, weakness and headaches.  Hematological:  Does not bruise/bleed easily.  All other systems reviewed and are negative.      Objective:   Physical Exam Vitals and nursing note reviewed.  Constitutional:      General: She is not in acute distress.    Appearance: Normal appearance. She is well-developed.  HENT:     Head: Normocephalic.     Right Ear: Tympanic membrane normal.     Left Ear: Tympanic membrane normal.     Nose: Nose normal.     Mouth/Throat:     Mouth: Mucous membranes are moist.  Eyes:     Pupils: Pupils  are equal, round, and reactive to light.  Neck:     Vascular: No carotid bruit or JVD.  Cardiovascular:      Rate and Rhythm: Normal rate and regular rhythm.     Heart sounds: Normal heart sounds.  Pulmonary:     Effort: Pulmonary effort is normal. No respiratory distress.     Breath sounds: Normal breath sounds. No wheezing or rales.  Chest:     Chest wall: No tenderness.  Abdominal:     General: Bowel sounds are normal. There is no distension or abdominal bruit.     Palpations: Abdomen is soft. There is no hepatomegaly, splenomegaly, mass or pulsatile mass.     Tenderness: There is no abdominal tenderness.  Musculoskeletal:        General: Normal range of motion.     Cervical back: Normal range of motion and neck supple.  Lymphadenopathy:     Cervical: No cervical adenopathy.  Skin:    General: Skin is warm and dry.  Neurological:     Mental Status: She is alert and oriented to person, place, and time.     Deep Tendon Reflexes: Reflexes are normal and symmetric.  Psychiatric:        Behavior: Behavior normal.        Thought Content: Thought content normal.        Judgment: Judgment normal.     BP (!) 144/72   Pulse (!) 53   Temp (!) 96.9 F (36.1 C) (Temporal)   Ht 5' 10 (1.778 m)   Wt 195 lb (88.5 kg)   SpO2 91%   BMI 27.98 kg/m    HGBA1c 5.7%     Assessment & Plan:   MIXTLI RENO comes in today with chief complaint of No chief complaint on file.   Diagnosis and orders addressed:  1. Essential hypertension, benign Low sodium diet Keep diary of blood pressure at home - CBC with Differential/Platelet - CMP14+EGFR  2. Chronic respiratory failure with hypoxia (HCC) 3. Simple chronic bronchitis (HCC) Report any respiratory issues  4. Gastroesophageal reflux disease without esophagitis Avoid spicy foods Do not eat 2 hours prior to bedtime   5. Hyperlipidemia due to type 2 diabetes mellitus (HCC) Low fat diet - Lipid panel  6. Diabetes mellitus treated with oral medication (HCC) Continue to watch carbs in diet - Bayer DCA Hb A1c Waived -  Microalbumin / creatinine urine ratio  7. Episode of recurrent major depressive disorder, unspecified depression episode severity (HCC) Stress management Will start  on celexa  - citalopram  (CELEXA ) 20 MG tablet; Take 1 tablet (20 mg total) by mouth daily.  Dispense: 30 tablet; Refill: 5  8. GAD (generalized anxiety disorder) Continue ativan  at night- increased to 1mg  qhs  9. Late effect of cerebrovascular accident (CVA)  10. Vitamin D  deficiency Daily vitamin d  supplement  11. Hypokalemia Labs pending  12. Other osteoporosis without current pathological fracture Fall prevention  13. Overweight (BMI 25.0-29.9) Discussed diet and exercise for person with BMI >25 Will recheck weight in 3-6 months    Labs pending Health Maintenance reviewed Diet and exercise encouraged  Follow up plan: 6 months   Mary-Margaret Lunger, FNP

## 2024-06-11 ENCOUNTER — Ambulatory Visit: Payer: Self-pay | Admitting: Nurse Practitioner

## 2024-06-11 LAB — CBC WITH DIFFERENTIAL/PLATELET
Basophils Absolute: 0.1 x10E3/uL (ref 0.0–0.2)
Basos: 1 %
EOS (ABSOLUTE): 0.2 x10E3/uL (ref 0.0–0.4)
Eos: 3 %
Hematocrit: 37.3 % (ref 34.0–46.6)
Hemoglobin: 12 g/dL (ref 11.1–15.9)
Immature Grans (Abs): 0 x10E3/uL (ref 0.0–0.1)
Immature Granulocytes: 0 %
Lymphocytes Absolute: 2.2 x10E3/uL (ref 0.7–3.1)
Lymphs: 39 %
MCH: 30.8 pg (ref 26.6–33.0)
MCHC: 32.2 g/dL (ref 31.5–35.7)
MCV: 96 fL (ref 79–97)
Monocytes Absolute: 0.6 x10E3/uL (ref 0.1–0.9)
Monocytes: 10 %
Neutrophils Absolute: 2.6 x10E3/uL (ref 1.4–7.0)
Neutrophils: 47 %
Platelets: 217 x10E3/uL (ref 150–450)
RBC: 3.9 x10E6/uL (ref 3.77–5.28)
RDW: 12.1 % (ref 11.7–15.4)
WBC: 5.6 x10E3/uL (ref 3.4–10.8)

## 2024-06-11 LAB — CMP14+EGFR
ALT: 14 IU/L (ref 0–32)
AST: 31 IU/L (ref 0–40)
Albumin: 4.1 g/dL (ref 3.7–4.7)
Alkaline Phosphatase: 51 IU/L (ref 48–129)
BUN/Creatinine Ratio: 25 (ref 12–28)
BUN: 28 mg/dL — ABNORMAL HIGH (ref 8–27)
Bilirubin Total: 0.2 mg/dL (ref 0.0–1.2)
CO2: 20 mmol/L (ref 20–29)
Calcium: 10.8 mg/dL — ABNORMAL HIGH (ref 8.7–10.3)
Chloride: 104 mmol/L (ref 96–106)
Creatinine, Ser: 1.11 mg/dL — ABNORMAL HIGH (ref 0.57–1.00)
Globulin, Total: 3 g/dL (ref 1.5–4.5)
Glucose: 110 mg/dL — ABNORMAL HIGH (ref 70–99)
Potassium: 4.5 mmol/L (ref 3.5–5.2)
Sodium: 140 mmol/L (ref 134–144)
Total Protein: 7.1 g/dL (ref 6.0–8.5)
eGFR: 49 mL/min/1.73 — ABNORMAL LOW (ref >59)

## 2024-06-11 LAB — LIPID PANEL
Chol/HDL Ratio: 4.5 ratio — ABNORMAL HIGH (ref 0.0–4.4)
Cholesterol, Total: 144 mg/dL (ref 100–199)
HDL: 32 mg/dL — ABNORMAL LOW (ref >39)
LDL Chol Calc (NIH): 72 mg/dL (ref 0–99)
Triglycerides: 242 mg/dL — ABNORMAL HIGH (ref 0–149)
VLDL Cholesterol Cal: 40 mg/dL (ref 5–40)

## 2024-06-11 LAB — MICROALBUMIN / CREATININE URINE RATIO
Creatinine, Urine: 144.4 mg/dL
Microalb/Creat Ratio: 120 mg/g{creat} — ABNORMAL HIGH (ref 0–29)
Microalbumin, Urine: 172.7 ug/mL

## 2024-07-06 ENCOUNTER — Other Ambulatory Visit: Payer: Self-pay | Admitting: Nurse Practitioner

## 2024-09-06 ENCOUNTER — Ambulatory Visit: Admitting: Nurse Practitioner

## 2024-09-06 ENCOUNTER — Ambulatory Visit: Payer: Self-pay

## 2024-12-06 ENCOUNTER — Ambulatory Visit: Admitting: Nurse Practitioner

## 2025-03-01 ENCOUNTER — Ambulatory Visit: Payer: Self-pay
